# Patient Record
Sex: Male | Born: 1955 | Race: White | Hispanic: No | Marital: Married | Smoking: Former smoker
Health system: Southern US, Community
[De-identification: ages and names within clinical notes are randomized; demographics above are authoritative.]

## PROBLEM LIST (undated history)

## (undated) DIAGNOSIS — S32020A Wedge compression fracture of second lumbar vertebra, initial encounter for closed fracture: Secondary | ICD-10-CM

## (undated) DIAGNOSIS — E669 Obesity, unspecified: Secondary | ICD-10-CM

## (undated) DIAGNOSIS — G4730 Sleep apnea, unspecified: Secondary | ICD-10-CM

## (undated) DIAGNOSIS — E785 Hyperlipidemia, unspecified: Secondary | ICD-10-CM

## (undated) DIAGNOSIS — T4145XA Adverse effect of unspecified anesthetic, initial encounter: Secondary | ICD-10-CM

## (undated) DIAGNOSIS — M961 Postlaminectomy syndrome, not elsewhere classified: Secondary | ICD-10-CM

## (undated) DIAGNOSIS — M19011 Primary osteoarthritis, right shoulder: Secondary | ICD-10-CM

## (undated) DIAGNOSIS — Z8669 Personal history of other diseases of the nervous system and sense organs: Secondary | ICD-10-CM

## (undated) DIAGNOSIS — K219 Gastro-esophageal reflux disease without esophagitis: Secondary | ICD-10-CM

## (undated) DIAGNOSIS — E119 Type 2 diabetes mellitus without complications: Secondary | ICD-10-CM

## (undated) DIAGNOSIS — I350 Nonrheumatic aortic (valve) stenosis: Secondary | ICD-10-CM

## (undated) DIAGNOSIS — I5030 Unspecified diastolic (congestive) heart failure: Secondary | ICD-10-CM

## (undated) DIAGNOSIS — G8929 Other chronic pain: Secondary | ICD-10-CM

## (undated) DIAGNOSIS — I10 Essential (primary) hypertension: Secondary | ICD-10-CM

## (undated) DIAGNOSIS — I509 Heart failure, unspecified: Secondary | ICD-10-CM

## (undated) DIAGNOSIS — M4302 Spondylolysis, cervical region: Secondary | ICD-10-CM

## (undated) DIAGNOSIS — T8859XA Other complications of anesthesia, initial encounter: Secondary | ICD-10-CM

## (undated) HISTORY — PX: JOINT REPLACEMENT: SHX530

## (undated) HISTORY — DX: Essential (primary) hypertension: I10

## (undated) HISTORY — PX: VENTRICULO-PERITONEAL SHUNT PLACEMENT / LAPAROSCOPIC INSERTION PERITONEAL CATHETER: SUR1040

## (undated) HISTORY — PX: BACK SURGERY: SHX140

## (undated) HISTORY — PX: EYE SURGERY: SHX253

## (undated) HISTORY — PX: OTHER SURGICAL HISTORY: SHX169

## (undated) HISTORY — PX: TONSILLECTOMY: SUR1361

## (undated) HISTORY — DX: Type 2 diabetes mellitus without complications: E11.9

## (undated) HISTORY — PX: CATARACT EXTRACTION W/ INTRAOCULAR LENS IMPLANT: SHX1309

## (undated) HISTORY — DX: Hyperlipidemia, unspecified: E78.5

## (undated) HISTORY — PX: CARPAL TUNNEL RELEASE: SHX101

---
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
## 2006-11-07 DIAGNOSIS — J189 Pneumonia, unspecified organism: Secondary | ICD-10-CM | POA: Insufficient documentation

## 2007-04-05 DIAGNOSIS — K219 Gastro-esophageal reflux disease without esophagitis: Secondary | ICD-10-CM | POA: Insufficient documentation

## 2008-07-08 DIAGNOSIS — L209 Atopic dermatitis, unspecified: Secondary | ICD-10-CM | POA: Insufficient documentation

## 2008-07-08 HISTORY — DX: Atopic dermatitis, unspecified: L20.9

## 2008-11-21 DIAGNOSIS — G609 Hereditary and idiopathic neuropathy, unspecified: Secondary | ICD-10-CM | POA: Insufficient documentation

## 2009-02-17 DIAGNOSIS — M1990 Unspecified osteoarthritis, unspecified site: Secondary | ICD-10-CM | POA: Insufficient documentation

## 2010-04-22 DIAGNOSIS — E291 Testicular hypofunction: Secondary | ICD-10-CM

## 2010-04-22 HISTORY — DX: Testicular hypofunction: E29.1

## 2010-08-03 ENCOUNTER — Ambulatory Visit: Payer: Self-pay | Admitting: General Practice

## 2010-08-19 ENCOUNTER — Inpatient Hospital Stay: Payer: Self-pay | Admitting: General Practice

## 2010-09-16 DIAGNOSIS — E119 Type 2 diabetes mellitus without complications: Secondary | ICD-10-CM | POA: Insufficient documentation

## 2010-09-16 HISTORY — DX: Type 2 diabetes mellitus without complications: E11.9

## 2010-09-21 DIAGNOSIS — G4730 Sleep apnea, unspecified: Secondary | ICD-10-CM | POA: Insufficient documentation

## 2010-09-21 DIAGNOSIS — G4733 Obstructive sleep apnea (adult) (pediatric): Secondary | ICD-10-CM | POA: Insufficient documentation

## 2011-05-22 ENCOUNTER — Emergency Department: Payer: Self-pay | Admitting: Emergency Medicine

## 2011-06-01 ENCOUNTER — Emergency Department: Payer: Self-pay | Admitting: Emergency Medicine

## 2011-07-06 ENCOUNTER — Ambulatory Visit: Payer: Self-pay | Admitting: Unknown Physician Specialty

## 2011-07-06 LAB — BASIC METABOLIC PANEL
Anion Gap: 12 (ref 7–16)
Calcium, Total: 9.4 mg/dL (ref 8.5–10.1)
Chloride: 99 mmol/L (ref 98–107)
Co2: 26 mmol/L (ref 21–32)
Creatinine: 0.77 mg/dL (ref 0.60–1.30)
EGFR (Non-African Amer.): >60
Glucose: 269 mg/dL — ABNORMAL HIGH (ref 65–99)
Osmolality: 284 (ref 275–301)
Potassium: 4.1 mmol/L (ref 3.5–5.1)
Sodium: 137 mmol/L (ref 136–145)

## 2012-04-14 ENCOUNTER — Ambulatory Visit: Payer: Self-pay | Admitting: General Practice

## 2012-05-17 HISTORY — PX: REPLACEMENT TOTAL KNEE BILATERAL: SUR1225

## 2012-05-30 ENCOUNTER — Ambulatory Visit: Payer: Self-pay | Admitting: General Practice

## 2012-05-30 LAB — SEDIMENTATION RATE: Erythrocyte Sed Rate: 4 mm/hr (ref 0–20)

## 2012-05-30 LAB — BASIC METABOLIC PANEL
Anion Gap: 8 (ref 7–16)
Calcium, Total: 9 mg/dL (ref 8.5–10.1)
EGFR (African American): >60
EGFR (Non-African Amer.): >60
Glucose: 334 mg/dL — ABNORMAL HIGH (ref 65–99)
Osmolality: 290 (ref 275–301)
Potassium: 3.9 mmol/L (ref 3.5–5.1)

## 2012-05-30 LAB — PROTIME-INR: INR: 0.9

## 2012-05-30 LAB — URINALYSIS, COMPLETE
Bilirubin,UR: NEGATIVE
Glucose,UR: >=500 mg/dL (ref 0–75)
Leukocyte Esterase: NEGATIVE
Nitrite: NEGATIVE
Protein: NEGATIVE
Specific Gravity: 1.033 (ref 1.003–1.030)
Squamous Epithelial: 1
WBC UR: NONE SEEN /HPF (ref 0–5)

## 2012-05-30 LAB — APTT: Activated PTT: 31.4 secs (ref 23.6–35.9)

## 2012-05-30 LAB — CBC
HCT: 47.3 % (ref 40.0–52.0)
MCHC: 33.5 g/dL (ref 32.0–36.0)
MCV: 87 fL (ref 80–100)
Platelet: 195 10*3/uL (ref 150–440)
RBC: 5.43 10*6/uL (ref 4.40–5.90)
RDW: 14.3 % (ref 11.5–14.5)
WBC: 6.9 10*3/uL (ref 3.8–10.6)

## 2012-05-30 LAB — MRSA PCR SCREENING

## 2012-06-23 ENCOUNTER — Inpatient Hospital Stay: Payer: Self-pay | Admitting: General Practice

## 2012-06-23 LAB — SYNOVIAL CELL COUNT + DIFF, W/ CRYSTALS
Basophil: 0 %
Lymphocytes: 53 %
Nucleated Cell Count: 511 /mm3
Other Cells BF: 0 %

## 2012-06-24 LAB — BASIC METABOLIC PANEL
BUN: 18 mg/dL (ref 7–18)
Calcium, Total: 7.4 mg/dL — ABNORMAL LOW (ref 8.5–10.1)
Chloride: 103 mmol/L (ref 98–107)
Co2: 25 mmol/L (ref 21–32)
EGFR (African American): >60
Glucose: 305 mg/dL — ABNORMAL HIGH (ref 65–99)
Osmolality: 285 (ref 275–301)

## 2012-06-24 LAB — HEMOGLOBIN: HGB: 11.4 g/dL — ABNORMAL LOW (ref 13.0–18.0)

## 2012-06-24 LAB — PLATELET COUNT: Platelet: 180 10*3/uL (ref 150–440)

## 2012-06-25 LAB — BASIC METABOLIC PANEL
Anion Gap: 7 (ref 7–16)
BUN: 9 mg/dL (ref 7–18)
Chloride: 100 mmol/L (ref 98–107)
Creatinine: 0.66 mg/dL (ref 0.60–1.30)
EGFR (Non-African Amer.): >60
Osmolality: 276 (ref 275–301)
Potassium: 3.8 mmol/L (ref 3.5–5.1)
Sodium: 135 mmol/L — ABNORMAL LOW (ref 136–145)

## 2012-06-25 LAB — PLATELET COUNT: Platelet: 149 10*3/uL — ABNORMAL LOW (ref 150–440)

## 2012-06-25 LAB — HEMOGLOBIN: HGB: 10.4 g/dL — ABNORMAL LOW (ref 13.0–18.0)

## 2012-06-26 LAB — PATHOLOGY REPORT

## 2012-06-27 LAB — WOUND CULTURE

## 2013-11-06 ENCOUNTER — Ambulatory Visit: Payer: Self-pay | Admitting: Physician Assistant

## 2013-11-29 ENCOUNTER — Ambulatory Visit: Payer: Self-pay | Admitting: Family Medicine

## 2014-09-06 NOTE — Op Note (Signed)
PATIENT NAME:  Nathan Dean, Nathan Dean MR#:  [id number] DATE OF BIRTH:  March 26, 1956  DATE OF PROCEDURE:  06/23/2012  PREOPERATIVE DIAGNOSIS: Loose right total knee arthroplasty.   POSTOPERATIVE DIAGNOSES: 1. 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  3. Metallosis.   PROCEDURE PERFORMED: Right total revision arthroplasty with bone grafting of the tibia and extensive synovectomy.   SURGEON: Illene LabradorJames P. Hooten, M.D.   ASSISTANT: Van ClinesJon Wolfe, PA, (required to maintain retraction throughout the procedure).   ANESTHESIA: Spinal.   ESTIMATED BLOOD LOSS: 600 mL.   FLUIDS REPLACED: 2800 mL of crystalloid.   TOURNIQUET TIME:  1. 91 minutes. 2. 45 minutes.   IMPLANTS UTILIZED: DePuy size 5 Sigma PC-3 femoral component, 5 degree Sigma femoral adapter, +2 Sigma femoral adapter bolt, 40-mm universal femoral sleeve (porous coated), 22-mm x 75-mm fluted stem, size 8-mm distal lateral augment, 4-mm posterior lateral augment, 8-mm posterior medial augment, size 5 MBT revision tibial component, a 45-mm MBT revision metaphyseal sleeve, 75-mm x 22-mm fluted stem, and 20 mL of cancellous bone.   INDICATIONS FOR SURGERY: The patient is a 59 year old male who [facility] approximately 22 years ago. More recently he has had episodes of giving way, pain, and swelling of the right knee. C-reactive protein and sedimentation rate were within normal limits but technetium bone scan demonstrated findings consistent with component loosening. Radiographic findings also demonstrated lucency about the screws through the tibial plate. After discussion of the risks and benefits of surgical intervention, the patient expressed his understanding of the risks, benefits, and agreed with plans for surgical intervention.   PROCEDURE IN DETAIL: The patient was brought to the Operating Room and, after adequate spinal anesthesia was achieved, a tourniquet was placed on the patient's  upper right thigh. The patient's right knee and leg were cleaned and prepped with alcohol and DuraPrep and draped in the usual sterile fashion. A "time-out" was performed as per usual protocol. The right lower extremity was exsanguinated using an Esmarch, and the tourniquet was inflated to 300 mmHg. An anterior incision was made in line with the previous skin incision line. The right knee was then aspirated with approximately 30 mL of slightly cloudy, yellow fluid obtained. The fluid was submitted for a complete cell count, stat Gram stain and cultures. A medial parapatellar incision was made. A moderate effusion was evacuated. There was noted to be significant grayish discoloration of the synovial tissue consistent by extensive metallosis. An extensive synovectomy was performed using electrocautery. The patella was subluxed laterally. Soft tissue was debrided along the margins of the femoral component, as well as along the margins of the tibial component. The locking key for the polyethylene was removed and the polyethylene was removed. Inspection of the polyethylene demonstrated severe wear with evidence of delamination as well as pitting. Next, the femoral component was addressed. Thin osteotomes were used to disrupt the implant cement interface starting first on the anterior flange and then moving along the medial and lateral margins. The femoral component was then removed with little difficulty. Relatively good maintenance of bone stock was appreciated. Next, attention was directed to the tibial component. Thin osteotomes were used at the cement implant interface with continuation of the debridement along both the medial and lateral aspects of the tray. The tibial extractor was then inserted under the plate and the associated slap hammer was used to elevate the tibial tray. This was done after removal of the cancellous screws. There was noted to be significant metallosis  in the soft tissue invasion along the  screw tracks. A curette was used to debride the screw tracts. A pilot hole for reaming of the femoral and tibial trials was performed. Tissue had previously been submitted for pathology, stat Gram stain, and cultures. Swabs for subsequently obtained of the femoral canal and tibial canal. These were submitted for stat Gram stain as well as culture. The femoral and tibial canals were prepared for a 75-mm stem and both were reamed to a 22-mm diameter. The proximal tibia was sized and it was felt that a size 5 tray was appropriate. The proximal tibial cut was performed using an intramedullary cutting guide and specified for 2 degrees posterior slope. Next, serial sleeves were placed up to a 45-mm metaphyseal sleeve. The size 5 tibial tray was positioned. Good coverage was appreciated. A keel punch was inserted.   Next, attention was directed to the distal femur. With the intramedullary guide in place, the distal femoral cutting guide was positioned. It was elected to remove 1 mm of bone from the distal medial aspect and this would require an 8-mm augment for the distal lateral aspect. A size 5 cutting block was then placed and rotation was decided in line with the epicondylar axis. This required an 8-mm augment on the posterior medial aspect and a 4-mm augment on the posterior lateral aspect. Chamfer cuts were subsequently performed. Finally, a cutting guide for the central box was positioned, and a central box cut was performed so as to accommodate a TC3 implant. Next, serial femoral sleeves impacted into place up to a 40-mm sleeve. This allowed for a good purchase. Trial reduction was then performed with the aforementioned augments placed. A 12.5-mm polyethylene trial was inserted. Full extension was noted and a good balance was appreciated. Excellent flexion was also appreciated. The tourniquet was deflated after total tourniquet time of 91 minutes for the first session. Attention was directed to the patella. Soft  tissue around the circumference of the patellar component was debrided. Significant polyethylene wear was encountered. There was some gross loosening of the patellar component and it was easily elevated off of the underlying bone. Rongeurs were used to debride the underlying cement. Oscillating saw was then used to perform a cut on the patella. The patella was sized and it was felt that a 41-mm patellar was appropriate. Patellar trial was placed and the knee was placed through a range of motion with excellent patellar tracking appreciated. The tourniquet had been down for greater than 45 minutes. The right lower extremity was exsanguinated a second time using an Esmarch and the tourniquet was reinflated to 300 mmHg. The trial components were removed. Cortical cancellous bone had been reconstituted and was packed along several of the voids along the metaphyseal region of the tibia. The cut surfaces of bone were irrigated with copious amounts of normal saline with antibiotic solution using pulsatile lavage and then suctioned dry. Polymethyl methacrylate cement with gentamicin was mixed in the usual fashion using a vacuum mixer. The size 5 MBT revision tray with a 45-mm metaphyseal sleeve and a 75-mm x 22-mm fluted stem was then positioned after placement of cement along the cut surface of the tibia as well as on the undersurface of the tray. The tibial construct was impacted into place. Excess cement was removed using Personal assistant. Cement was then applied to the cut surface of the femur, as well as on the posterior flanges of size 5 Sigma TC3 femoral component with a 40-mm femoral sleeve and  a 75-mm x 22-mm fluted stem. The femoral construct was positioned and impacted into place. The excess cement was removed using Personal assistant. A 12.5-mm TC3 polyethylene trial was inserted and the knee was brought in full extension with steady axial compression applied. Finally, cement was applied to the backside of a 41-mm  3-peg oval dome patella and the patellar component was positioned and patellar clamp applied. The excess cement was removed using Personal assistant.   After adequate curing of the cement, the tourniquet was deflated after a second tourniquet time of 45 minutes. Hemostasis was achieved using electrocautery. The polyethylene trial was removed. The knee was irrigated with copious amounts of normal saline with antibiotic solution using pulsatile lavage. Versajet was also used to further debride the medial and lateral gutters as well as along the posterior margin of the joint. The knee was inspected for any residual cement debris. Then 30 mL of 0.25% Marcaine with epinephrine was injected along the posterior capsule. A 12.5-mm TC3 stabilized rotating platform polyethylene insert was inserted and the knee was placed through a range of motion. Excellent patellar tracking was appreciated. Excellent flexion and extension was appreciated. Good stability was noted.   Two medium drains were placed in the wound bed and brought out through a separate stab incision to be attached to a reinfusion system. The medial parapatellar portion of the incision was reapproximated using interrupted sutures of #1 Vicryl. The subcutaneous tissue was approximated in layers using first #0 Vicryl followed by 2-0 Vicryl. The skin was closed with skin staples. A sterile dressing was applied.   The patient tolerated the procedure well. He was transported to the Recovery Room in stable condition.   ____________________________ Illene Labrador. Angie Fava., MD jph:jm D: 06/23/2012 19:13:45 ET T: 06/24/2012 12:57:17 ET JOB#: [id number]  cc: Fayrene Fearing P. Angie Fava., MD, <Dictator> Illene Labrador Angie Fava MD ELECTRONICALLY SIGNED 06/27/2012 6:58

## 2014-09-06 NOTE — Discharge Summary (Signed)
PATIENT NAME:  Nathan Dean, Nathan Dean MR#:  [id number] DATE OF BIRTH:  Jun 22, 1955  DATE OF ADMISSION:  06/23/2012 DATE OF DISCHARGE:    ADMITTING DIAGNOSES:  Pain, right knee secondary to loosening of the total knee arthroplasty.   DISCHARGE DIAGNOSIS:  Pain, right knee secondary to loosening of the total knee arthroplasty.  HISTORY OF PRESENT ILLNESS: The patient is a pleasant 59 year old who has been followed at [facility] for progression of right knee pain. He is status post 22 years right total knee arthroplasty performed at the [facility]. He has continued to have sharp stabbing type pain to the right knee with sensation of a "slipping" as well as "giving way" to the right knee. He had some continued swelling of the knee him. He denied any fever or chills. He also denied any gross locking of the knee. He had not seen any significant improvement in his condition despite activity modification. He was not using any ambulatory aid at the time of surgery. The patient did have extensive work-up for the loosening of component (including CBCs, sedimentation rate, C-reactive protein as well as MRI). There is no indication of any infection.  X-rays taken in the orthopedic department showed some questionable loosening of the tibial tray. A technetium bone scan performed on 04/14/2012 showed localization of tracer noted to the medial aspect and posterior margins of the tibial component. After discussion of the risks and benefits of surgical intervention, the patient expressed his understanding of the risks and benefits and agreed for plans for surgical intervention.   PROCEDURE: Right total knee revision arthroplasty with bone grafting of the tibia and extensive synovectomy.   ANESTHESIA: Spinal.   IMPLANTS UTILIZED: DePuy size 5 Sigma PC3 femoral component, 5 degree Sigma femoral adapter, +2 Sigma femoral adapter bolt, 40 mm Universal femoral sleeve porous-coated, 22 mm x 75 mm fluted stem, size 8 mm  distal lateral augment, 4 mm posterior lateral, 8 mm posterior medial augment, size 5 MBT revision tibial component, a 45 mm MBT revision metaphyseal sleeve, 75 x 22 mm fluted stem and 20 mL of cancellus bone.   HOSPITAL COURSE: The patient tolerated the procedure very well. He had no complications. He was then taken to the PAC-U where he was stabilized and then transferred to the orthopedic floor. He began receiving anticoagulation therapy of Lovenox 30 mg q.12 hours per anesthesia and pharmacy protocol. He was fitted with TED stockings bilaterally. These were allowed to be removed 1 hour per 8 hour shift. The patient was also fitted with the AV-I compression foot pumps bilaterally set at 80 mmHg.  His calves have been nontender. There has been no evidence of any DVTs. Heels were elevated off the bed using rolled towels. The patient was fitted with extra-large bed for large people with air cushioning bladder.  The patient denied chest pain or shortness of breath. Vital signs have been stable. He has been afebrile. Hemodynamically he was stable. No transfusions were given other than the Autovac transfusion given the first 6 hours postoperatively. His blood sugar levels have been running high the entire time.  He was placed on a sliding scale and Novolin R injections.   Physical therapy was initiated on day #1 for activities of daily living and for gait training and transfers. He has been extremely slow due to deconditioning and the extensive amount of surgery.  He was only ambulating 30 feet. He did have a good range of motion of the knee at 0 to 77 degrees.  Occupational  therapy was also initiated on day #1 ADLs and assistive devices.   The patient's IV, Foley and Hemovac were discontinued on day #2 along with the dressing changes. The wound was free of any drainage or signs of infection. The Polar Care was reapplied to the surgical leg maintaining a temperature of 40 to 50 degrees Fahrenheit.    DISPOSITION:  1.  The patient is discharged to skilled nursing facility in improved stable condition.  2.  He is allowed to weight bear as tolerated.  3.  Continue with TED stockings bilaterally. These are allowed to be removed 1 hour per 8 hour shift.  4.  Physical therapy for gait training and transfers.  5.  OT for activities of daily living and assistive devices.  6.  Elevate heels off the bed.  7.  Incentive spirometer q.1 hour while awake.  8.  Encourage cough, deep breathing q.2 hours while awake.  9.  ADA diet.  10.  He has a follow-up appointment in the clinic in 2 weeks.  11.  He is to call the clinic sooner if any temperatures of 101.5 or greater.   DRUG ALLERGIES: 1.  PENICILLIN. 2.  SULFA DRUGS. 3.  SUCCINYLCHOLINE.  MEDICATIONS: Norvasc 10 mg at bedtime.  Vitamin D3 1000 units daily. Senokot-S 1 tablet b.i.d.  hydrochlorothiazide 25 mg q.a.m. Zestril 40 mg at bedtime. Omega-3 fatty acid 1 gram capsule daily. Pantoprazole 40 mg b.i.d. Pravachol 20 mg daily. Lovenox 30 mg subcutaneous q.12 hours for 14 days, then discontinue and begin taking one 81 mg enteric-coated aspirin. Levemir 30 units subcutaneous usual schedule every day 8 a.m.  Insulin sliding scale Novolin R injections. Metformin 1000 mg b.i.d. with meal. Tylenol ES 500 to 1000 mg q.4 hours p.r.n. for pain and temperatures of 100.4. Norco 325/5, 1 to 2 tablets every 4 to 6 hours p.r.n. for pain. Mylanta DS 30 mL every 6 hours p.r.n. Dulcolax suppositories 10 mg rectally p.r.n. for constipation. Milk of magnesia 30 mL b.i.d. p.r.n.  Enema soapsuds if no relief with  results with milk of magnesia or Dulcolax.   PAST MEDICAL HISTORY:  Seasonal allergies, hypertension, diabetes, gastroesophageal reflux disease, hypercholesterolemia.     ____________________________ Van ClinesJon Wolfe, PA jrw:ct D: 06/27/2012 08:28:35 ET T: 06/27/2012 09:26:12 ET JOB#: [id number]  cc: Van ClinesJon Wolfe, PA, <Dictator> Illene LabradorJames P. Angie FavaHooten Jr., MD JON  [facility] PA ELECTRONICALLY SIGNED 07/02/2012 17:36

## 2014-10-21 ENCOUNTER — Other Ambulatory Visit: Payer: Self-pay | Admitting: Family Medicine

## 2014-10-21 MED ORDER — OMEPRAZOLE 20 MG PO CPDR: 20.0000 mg | DELAYED_RELEASE_CAPSULE | Freq: Every day | ORAL | Status: DC

## 2014-12-13 ENCOUNTER — Other Ambulatory Visit: Payer: Self-pay | Admitting: Family Medicine

## 2014-12-23 ENCOUNTER — Encounter: Payer: Self-pay | Admitting: Family Medicine

## 2014-12-23 ENCOUNTER — Ambulatory Visit (INDEPENDENT_AMBULATORY_CARE_PROVIDER_SITE_OTHER): Payer: BC Managed Care – PPO | Admitting: Family Medicine

## 2014-12-23 VITALS — BP 136/72 | HR 95 | Temp 98.8°F | Ht >= 80 in | Wt 275.5 lb

## 2014-12-23 DIAGNOSIS — E1142 Type 2 diabetes mellitus with diabetic polyneuropathy: Secondary | ICD-10-CM

## 2014-12-23 DIAGNOSIS — R42 Dizziness and giddiness: Secondary | ICD-10-CM | POA: Diagnosis not present

## 2014-12-23 DIAGNOSIS — R61 Generalized hyperhidrosis: Secondary | ICD-10-CM | POA: Diagnosis not present

## 2014-12-23 DIAGNOSIS — E1165 Type 2 diabetes mellitus with hyperglycemia: Secondary | ICD-10-CM | POA: Insufficient documentation

## 2014-12-23 LAB — POCT GLYCOSYLATED HEMOGLOBIN (HGB A1C): Hemoglobin A1C: 11

## 2014-12-23 MED ORDER — INSULIN PEN NEEDLE 32G X 4 MM MISC: 1.0000 | Freq: Three times a day (TID) | Status: DC

## 2014-12-23 MED ORDER — INSULIN ASPART 100 UNIT/ML FLEXPEN: 6.0000 [IU] | PEN_INJECTOR | Freq: Three times a day (TID) | SUBCUTANEOUS | Status: DC

## 2014-12-23 NOTE — Assessment & Plan Note (Addendum)
HgA1c elevated to 11% from last check. Sugars elevated above baseline. Suspect diabetes as etiology of symptoms.  Start mealtime insulin.  Lipid panel done today.  Alarm symptoms and hypoglycemia protocol reviewed with patient. RTC 1 week.

## 2014-12-23 NOTE — Progress Notes (Signed)
Subjective:    Patient ID: Nathan Dean, male    DOB: Apr 15, 1956, 59 y.o.   MRN: [id number]  HPI: Nathan Dean is a 59 y.o. male presenting on 12/23/2014 for Dizziness   Dizziness This is a new problem. The current episode started yesterday. The problem occurs intermittently. The problem has been waxing and waning. Associated symptoms include diaphoresis, fatigue and numbness (bilateral hands). Pertinent negatives include no chest pain, congestion, headaches, nausea, visual change, vomiting or weakness. The symptoms are aggravated by standing. He has tried drinking and rest for the symptoms. The treatment provided mild relief.    Pt presents for dizziness. Started yesterday and continued through today. Felt dizzy this AM and turned around to come back home. When he feels dizzy he starts to sweat. This is abnormal for him. He is diabetic- sugar this AM was 240. Avg sugars low 200's. He is taking levemir as directed.  Dizziness is described feeling lightheaded when he gets up. Pt does not describe sensation he could pass out. No chest pain, nausea, shortness of breath, palpitations. Pt denies head injury or headache.   Pt is also reporting increased pain in the hands- he has neuropathic pain from an injury to the hands.   Past Medical History  Diagnosis Date  . Diabetes   . Hypertension   . Hyperlipidemia     Current Outpatient Prescriptions on File Prior to Visit  Medication Sig  . omeprazole (PRILOSEC) 20 MG capsule Take 1 capsule (20 mg total) by mouth daily.   No current facility-administered medications on file prior to visit.    Review of Systems  Constitutional: Positive for diaphoresis and fatigue.  HENT: Negative.  Negative for congestion and tinnitus.   Eyes: Negative for visual disturbance.  Respiratory: Negative for chest tightness, shortness of breath and wheezing.   Cardiovascular: Negative for chest pain, palpitations and leg swelling.  Gastrointestinal: Negative  for nausea and vomiting.  Genitourinary: Negative for hematuria and difficulty urinating.  Neurological: Positive for dizziness, light-headedness and numbness (bilateral hands). Negative for seizures, syncope, facial asymmetry, speech difficulty, weakness and headaches.  Psychiatric/Behavioral: Negative for agitation. The patient is not nervous/anxious.    Per HPI unless specifically indicated above     Objective:    BP 136/72 mmHg  Pulse 95  Temp(Src) 98.8 F (37.1 C) (Oral)  Ht  (2.057 m)  Wt 275 lb 8 oz (124.966 kg)  BMI 29.53 kg/m2  Wt Readings from Last 3 Encounters:  12/23/14 275 lb 8 oz (124.966 kg)    Recent Results (from the past 2160 hour(s))  POCT HgB A1C     Status: Abnormal   Collection Time: 12/23/14  3:52 PM  Result Value Ref Range   Hemoglobin A1C 11.0    Physical Exam  Constitutional: He appears well-developed and well-nourished. No distress.  HENT:  Head: Normocephalic and atraumatic.  Neck: Normal range of motion. Neck supple.  Cardiovascular: Normal rate, regular rhythm and intact distal pulses.  Exam reveals no gallop and no friction rub.   No murmur heard. Pulmonary/Chest: Effort normal and breath sounds normal. He has no wheezes. He exhibits no tenderness.  Abdominal: Soft. Bowel sounds are normal. He exhibits no distension. There is no tenderness.  Musculoskeletal: Normal range of motion. He exhibits no edema.  Lymphadenopathy:    He has no cervical adenopathy.  Neurological: He is alert. He has normal strength and normal reflexes. No cranial nerve deficit or sensory deficit. He displays a negative Romberg sign.  Skin: He is diaphoretic.      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 Sugars elevated above baseline. Suspect diabetes as etiology of symptoms.  Start mealtime insulin.  Lipid  panel done today.  Alarm symptoms and hypoglycemia protocol reviewed with patient. RTC 1 week.       Relevant Medications   LEVEMIR FLEXTOUCH 100 UNIT/ML Pen   losartan (COZAAR) 100 MG tablet   pravastatin (PRAVACHOL) 20 MG tablet   metFORMIN (GLUCOPHAGE) 500 MG tablet   lisinopril (PRINIVIL,ZESTRIL) 40 MG tablet   insulin aspart (NOVOLOG) 100 UNIT/ML FlexPen   Insulin Pen Needle (BD PEN NEEDLE NANO U/F) 32G X 4 MM MISC   Other Relevant Orders   POCT HgB A1C (Completed)   Comprehensive Metabolic Panel (CMET)   Lipid panel   Ambulatory referral to Cardiology    Other Visit Diagnoses    Dizziness        EKG WNL. Would like cardiology consult if ongoing r/o cardiac issues given risk factors.  CBC, CMP, TSH ordered.    Relevant Orders    EKG 12-Lead    CBC with Differential    TSH    Ambulatory referral to Cardiology    Diaphoresis        EKG WNL. suspect diabetes as etiology. Close monitoring of BG. RTC 1 week.     Relevant Orders    Ambulatory referral to Cardiology       Meds ordered this encounter  Medications  . DISCONTD: metoprolol succinate (TOPROL-XL) 50 MG 24 hr tablet    Sig: Take 50 mg by mouth daily.    Refill:  1  . LEVEMIR FLEXTOUCH 100 UNIT/ML Pen    Sig: Inject 48 Units into the skin 2 (two) times daily.    Refill:  1  . hydrochlorothiazide (HYDRODIURIL) 25 MG tablet    Sig: Take 12.5 mg by mouth daily.    Refill:  1  . losartan (COZAAR) 100 MG tablet    Sig: Take 100 mg by mouth daily.    Refill:  0  . amLODipine (NORVASC) 10 MG tablet    Sig: Take 10 mg by mouth daily.    Refill:  0  . pravastatin (PRAVACHOL) 20 MG tablet    Sig: Take 20 mg by mouth daily.    Refill:  0  . metFORMIN (GLUCOPHAGE) 500 MG tablet    Sig: Take 500 mg by mouth 4 (four) times daily.  Marland Kitchen lisinopril (PRINIVIL,ZESTRIL) 40 MG tablet    Sig: Take 40 mg by mouth daily.  Marland Kitchen amitriptyline (ELAVIL) 50 MG tablet    Sig: Take 50 mg by mouth at bedtime.  . Omega-3 Fatty Acids  (FISH OIL) 1200 MG CAPS    Sig: Take 2,400 mg by mouth daily.  . Cholecalciferol (VITAMIN D3) 5000 UNITS CAPS    Sig: Take 10,000 Units by mouth daily.  . insulin aspart (NOVOLOG) 100 UNIT/ML FlexPen    Sig: Inject 6 Units into the skin 3 (three) times daily with meals.    Dispense:  15 mL    Refill:  11    Order Specific Question:  Supervising Provider    Answer:  Janeann Forehand [phone]  . Insulin Pen Needle (BD PEN NEEDLE NANO U/F) 32G X 4 MM MISC  Sig: Inject 1 each as directed 3 (three) times daily.    Dispense:  100 each    Refill:  5    Please dispense enough needles for twice daily for three months. BD U/F Nano Needles 32GX49mm.    Order Specific Question:  Supervising Provider    Answer:  Janeann Forehand [[id number]]      Follow up plan: Return in about 1 week (around 12/30/2014).

## 2014-12-23 NOTE — Patient Instructions (Signed)
Diabetes: I think your symptoms could be related to your diabetes. We are going to increase your insulin Levemir to 50 units twice daily. I want you to start taking mealtime insulin. Check you blood sugars before each meal. If you blood sugar is <100 do not take the insulin. Inject 6 units with each meal.    Please check your blood glucose 3 times daily. If your glucose is < 70 mg/dl or you have symptoms of hypoglycemia confusion, dizziness, headache, jitteriness and sweating please drink 4 oz of juice or soda.  Check blood glucose 15 minutes later. If it has not risen to >100, please seek medical attention. If > 100 please eat a snack containing protein such as peanut butter and crackers.  If you develop: Chest pain, shortness of breath, nausea, vomiting, abdominal pain, fatigue, facial asymmetry or any other concerning symptoms, please seek immediate medical attention.

## 2014-12-24 ENCOUNTER — Telehealth: Payer: Self-pay | Admitting: Family Medicine

## 2014-12-24 LAB — CBC WITH DIFFERENTIAL/PLATELET
Basophils Absolute: 0 10*3/uL (ref 0.0–0.2)
Basos: 1 %
EOS (ABSOLUTE): 0.6 10*3/uL — AB (ref 0.0–0.4)
Eos: 7 %
Hematocrit: 49.2 % (ref 37.5–51.0)
Hemoglobin: 16 g/dL (ref 12.6–17.7)
IMMATURE GRANULOCYTES: 0 %
Immature Grans (Abs): 0 10*3/uL (ref 0.0–0.1)
LYMPHS ABS: 1.9 10*3/uL (ref 0.7–3.1)
Lymphs: 23 %
MCH: 27.7 pg (ref 26.6–33.0)
MCHC: 32.5 g/dL (ref 31.5–35.7)
MCV: 85 fL (ref 79–97)
MONOCYTES: 7 %
Monocytes Absolute: 0.6 10*3/uL (ref 0.1–0.9)
NEUTROS ABS: 5.2 10*3/uL (ref 1.4–7.0)
Neutrophils: 62 %
PLATELETS: 202 10*3/uL (ref 150–379)
RBC: 5.77 x10E6/uL (ref 4.14–5.80)
RDW: 14.5 % (ref 12.3–15.4)
WBC: 8.3 10*3/uL (ref 3.4–10.8)

## 2014-12-24 LAB — COMPREHENSIVE METABOLIC PANEL
ALK PHOS: 92 IU/L (ref 39–117)
ALT: 21 IU/L (ref 0–44)
AST: 28 IU/L (ref 0–40)
Albumin/Globulin Ratio: 1.6 (ref 1.1–2.5)
Albumin: 4.4 g/dL (ref 3.6–4.8)
BILIRUBIN TOTAL: 0.5 mg/dL (ref 0.0–1.2)
BUN / CREAT RATIO: 23 — AB (ref 10–22)
BUN: 17 mg/dL (ref 8–27)
CALCIUM: 9.9 mg/dL (ref 8.6–10.2)
CO2: 24 mmol/L (ref 18–29)
Chloride: 92 mmol/L — ABNORMAL LOW (ref 97–108)
Creatinine, Ser: 0.75 mg/dL — ABNORMAL LOW (ref 0.76–1.27)
GFR calc Af Amer: 115 mL/min/{1.73_m2} (ref >59)
GFR calc non Af Amer: 100 mL/min/{1.73_m2} (ref >59)
GLUCOSE: 332 mg/dL — AB (ref 65–99)
Globulin, Total: 2.7 g/dL (ref 1.5–4.5)
POTASSIUM: 4.4 mmol/L (ref 3.5–5.2)
SODIUM: 136 mmol/L (ref 134–144)
Total Protein: 7.1 g/dL (ref 6.0–8.5)

## 2014-12-24 LAB — TSH: TSH: 1.05 u[IU]/mL (ref 0.450–4.500)

## 2014-12-24 NOTE — Telephone Encounter (Signed)
Reviewed lab results with patient. Glucose was 332. Cl was low- likely due to sweating/fluid loss. Encouraged hydration. CBC and TSH normal. Pt reports symptoms are much improved today with new insulin regimen. The sweating has stopped. Sugars are back to 200 range.  He is taking mealtime insulin as directed. Plan to follow-up in 1 week. Pt will schedule appt.

## 2014-12-26 ENCOUNTER — Telehealth: Payer: Self-pay | Admitting: Family Medicine

## 2014-12-26 NOTE — Telephone Encounter (Signed)
Pt said a nurse called yesterday about making  a cardiology appt.  He agreed to do that.

## 2014-12-26 NOTE — Telephone Encounter (Signed)
Nathan Dean informed pt with appointment date and time.

## 2014-12-27 ENCOUNTER — Other Ambulatory Visit: Payer: Self-pay | Admitting: Family Medicine

## 2014-12-27 DIAGNOSIS — IMO0002 Reserved for concepts with insufficient information to code with codable children: Secondary | ICD-10-CM

## 2014-12-27 DIAGNOSIS — E1165 Type 2 diabetes mellitus with hyperglycemia: Principal | ICD-10-CM

## 2014-12-27 DIAGNOSIS — E1140 Type 2 diabetes mellitus with diabetic neuropathy, unspecified: Secondary | ICD-10-CM

## 2015-01-02 ENCOUNTER — Ambulatory Visit (INDEPENDENT_AMBULATORY_CARE_PROVIDER_SITE_OTHER): Payer: BC Managed Care – PPO | Admitting: Cardiovascular Disease

## 2015-01-02 ENCOUNTER — Encounter: Payer: Self-pay | Admitting: Cardiovascular Disease

## 2015-01-02 VITALS — BP 130/88 | HR 84 | Ht >= 80 in | Wt 384.8 lb

## 2015-01-02 DIAGNOSIS — E785 Hyperlipidemia, unspecified: Secondary | ICD-10-CM | POA: Diagnosis not present

## 2015-01-02 DIAGNOSIS — I10 Essential (primary) hypertension: Secondary | ICD-10-CM | POA: Diagnosis not present

## 2015-01-02 NOTE — Patient Instructions (Addendum)
.  Karenann Cai - nutritionist [phone]  Medication Instructions:  Your physician recommends that you continue on your current medications as directed. Please refer to the Current Medication list given to you today.   Labwork: none  Testing/Procedures: none  Follow-Up: Your physician recommends that you schedule a follow-up appointment with Dr. Elease Hashimoto as needed.   Any Other Special Instructions Will Be Listed Below (If Applicable).

## 2015-01-02 NOTE — Progress Notes (Signed)
Cardiology Office Note   Date:  01/02/2015   ID:  Nathan Dean, DOB 1956-01-01, MRN [id number]  PCP:  Fidel Levy, MD  Cardiologist:   Elease Hashimoto Deloris Ping, MD   Chief Complaint  Patient presents with  . other    Ref by Dr. Juanetta Gosling for cardiac evaluation due to the patient's history of, HTN, Diabetes & hyperlipidemia. Pt. denies chest pain or shortness of breath. Meds reviewed by the patient verbally.    Problem list: 1. Hypertension 2. Diabetes mellitus 3. Hyperlipidemia 4. Morbid obesity   History of Present Illness: Nathan Dean is a 59 y.o. male who presents for further evaluation .  Multiple risk factors for CAD but no symptoms  Works as a Leisure centre manager some at work - no exercise at home. Glucose levels are 240 or so Weight has been going up .  Trying to limit carbs.   No limitations with weekend yard work .   Past Medical History  Diagnosis Date  . Diabetes   . Hypertension   . Hyperlipidemia     Past Surgical History  Procedure Laterality Date  . Replacement total knee bilateral  2014    left 2012 rt 2014     Current Outpatient Prescriptions  Medication Sig Dispense Refill  . amitriptyline (ELAVIL) 100 MG tablet Take 100 mg by mouth at bedtime.    Marland Kitchen amLODipine (NORVASC) 10 MG tablet Take 5 mg by mouth daily.   0  . Cholecalciferol (VITAMIN D3) 5000 UNITS CAPS Take 10,000 Units by mouth daily.     Marland Kitchen glucose blood (BAYER CONTOUR NEXT TEST) test strip Check blood sugar 4 times daily. 100 each 11  . hydrochlorothiazide (HYDRODIURIL) 25 MG tablet Take 12.5 mg by mouth daily.  1  . insulin aspart (NOVOLOG) 100 UNIT/ML FlexPen Inject 6 Units into the skin 3 (three) times daily with meals. 15 mL 11  . Insulin Pen Needle (BD PEN NEEDLE NANO U/F) 32G X 4 MM MISC Inject 1 each as directed 3 (three) times daily. 100 each 5  . LEVEMIR FLEXTOUCH 100 UNIT/ML Pen Inject 50 Units into the skin 2 (two) times daily.   1  . losartan (COZAAR) 100 MG tablet  Take 100 mg by mouth daily.  0  . metFORMIN (GLUCOPHAGE) 500 MG tablet Take 500 mg by mouth 4 (four) times daily.    . metoprolol succinate (TOPROL-XL) 50 MG 24 hr tablet Take 50 mg by mouth daily. Take with or immediately following a meal.    . Omega-3 Fatty Acids (FISH OIL) 1200 MG CAPS Take 2,400 mg by mouth daily.    Marland Kitchen omeprazole (PRILOSEC) 20 MG capsule Take 1 capsule (20 mg total) by mouth daily. 30 capsule 3  . pravastatin (PRAVACHOL) 20 MG tablet Take 20 mg by mouth daily.  0   No current facility-administered medications for this visit.    Allergies:   Penicillins; Succinylcholine chloride; Keflex; and Sulfa antibiotics    Social History:  The patient  reports that he quit smoking about 18 years ago. His smoking use included Cigarettes. He has a 40 pack-year smoking history. He has never used smokeless tobacco. He reports that he drinks alcohol. He reports that he does not use illicit drugs.   Family History:  The patient's family history includes Cancer in his brother and mother; Kidney failure in his father.    ROS:  Please see the history of present illness.    Review of Systems: Constitutional:  denies  fever, chills, diaphoresis, appetite change and fatigue.  HEENT: denies photophobia, eye pain, redness, hearing loss, ear pain, congestion, sore throat, rhinorrhea, sneezing, neck pain, neck stiffness and tinnitus.  Respiratory: denies SOB, DOE, cough, chest tightness, and wheezing.  Cardiovascular: denies chest pain, palpitations and leg swelling.  Gastrointestinal: denies nausea, vomiting, abdominal pain, diarrhea, constipation, blood in stool.  Genitourinary: denies dysuria, urgency, frequency, hematuria, flank pain and difficulty urinating.  Musculoskeletal: denies  myalgias, back pain, joint swelling, arthralgias and gait problem.   Skin: denies pallor, rash and wound.  Neurological: denies dizziness, seizures, syncope, weakness, light-headedness, numbness and headaches.    Hematological: denies adenopathy, easy bruising, personal or family bleeding history.  Psychiatric/ Behavioral: denies suicidal ideation, mood changes, confusion, nervousness, sleep disturbance and agitation.       All other systems are reviewed and negative.    PHYSICAL EXAM: VS:  BP 130/88 mmHg  Pulse 84  Ht 6\' 9"  (2.057 m)  Wt 174.521 kg (384 lb 12 oz)  BMI 41.25 kg/m2 , BMI Body mass index is 41.25 kg/(m^2). GEN: Well nourished, well developed, in no acute distress,   obese, very tall gentleman  HEENT: normal Neck: no JVD, carotid bruits, or masses Cardiac: RRR; no murmurs, rubs, or gallops,no edema  Respiratory:  clear to auscultation bilaterally, normal work of breathing GI: soft, nontender, nondistended, + BS, obesity  MS: no deformity or atrophy Skin: warm and dry, no rash Neuro:  Strength and sensation are intact Psych: normal   EKG:  EKG is not ordered today. The ekg ordered 12/23/14  demonstrates NSR , no ST or T wave changes.    Recent Labs: 12/23/2014: ALT 21; BUN 17; Creatinine, Ser 0.75*; Potassium 4.4; Sodium 136; TSH 1.050    Lipid Panel No results found for: CHOL, TRIG, HDL, CHOLHDL, VLDL, LDLCALC, LDLDIRECT    Wt Readings from Last 3 Encounters:  01/02/15 174.521 kg (384 lb 12 oz)  12/23/14 124.966 kg (275 lb 8 oz)      Other studies Reviewed: Additional studies/ records that were reviewed today include: . Review of the above records demonstrates:    ASSESSMENT AND PLAN:  1.  Risk for cardiac disease: The patient has a history of hypertension, hyperlipidemia, obesity, diabetes mellitus. At present, he is not having any symptoms of chest pain or shortness breath. I do not think that he needs to have any further studies at this time. He certainly needs to work on his risk factors. He admits that he needs to work on his diet. He knows that he needs to lose weight. His diabetes is relatively poorly controlled. He states that his glucose levels are  typically are around 240.  I've encouraged him to exercise on a regular basis. I've encouraged him to watch his dietary intake of fats and carbs.  I've given him the name of a nutritionist, Amy Leron Croak   I'll see him on an as-needed basis.  Current medicines are reviewed at length with the patient today.  The patient does not have concerns regarding medicines.  The following changes have been made:  no change  Labs/ tests ordered today include:  No orders of the defined types were placed in this encounter.     Disposition:   FU with Dr. Juanetta Gosling.  He may see me as needed.     Karim Aiello, Deloris Ping, MD  01/02/2015 12:11 PM    [facility] [address] [place], [place], [place]  [address] Phone: [phone]; Fax: [phone]   [employer] Office  [address] [place], [place]  [address] [phone]   Fax [phone]

## 2015-01-14 ENCOUNTER — Other Ambulatory Visit: Payer: Self-pay | Admitting: Family Medicine

## 2015-01-30 ENCOUNTER — Other Ambulatory Visit: Payer: Self-pay | Admitting: Family Medicine

## 2015-02-05 ENCOUNTER — Telehealth: Payer: Self-pay | Admitting: *Deleted

## 2015-02-05 NOTE — Telephone Encounter (Signed)
We have started PA for Omeprazole. Need to know what medications patient has tried in the past for acid reflux?

## 2015-02-05 NOTE — Telephone Encounter (Signed)
Patient called back and does not know what he has taken in the past. He will call the office back.

## 2015-02-18 NOTE — Telephone Encounter (Signed)
Patient could not think of medications he had taken previously as he has been on Omeprazole for years. [employer] has denied PA request for Omeprazole 20 mg. Coverage beyond 90 days per 180 day period for severe/atypical GERD or moderate GERD with daily/disabling symptoms having failed 30 day trial of high-dose H2 Blockers. Please advise.

## 2015-02-18 NOTE — Telephone Encounter (Signed)
I don't now what he wants from this message.  Does he need different medication.  How bad are his sx. When he is off  any PPi?  -jh

## 2015-02-18 NOTE — Telephone Encounter (Signed)
This was just a FYI until patient contacts office. A letter from patient's insurance will be sent to him in regards to denial.

## 2015-03-03 ENCOUNTER — Other Ambulatory Visit: Payer: Self-pay | Admitting: Family Medicine

## 2015-03-20 LAB — HM DIABETES EYE EXAM

## 2015-04-03 ENCOUNTER — Telehealth: Payer: Self-pay | Admitting: Family Medicine

## 2015-04-03 MED ORDER — PANTOPRAZOLE SODIUM 40 MG PO TBEC: 40.0000 mg | DELAYED_RELEASE_TABLET | Freq: Every day | ORAL | Status: DC

## 2015-04-03 NOTE — Telephone Encounter (Signed)
Pt  Called  Requesting a refill on   Omeprazole   20 mg pt call back # [phone]

## 2015-04-03 NOTE — Telephone Encounter (Signed)
Patient has been on Omeprazole 20 since 2010. Patient's ins will not cover Omeprazole. Patient would like something else called in.

## 2015-04-03 NOTE — Telephone Encounter (Signed)
Tried to contact patient w/o success. Called his spouse and relayed message. Pt was suppose to call office back in September and let us know what he has taken in the past for GERD. Ins plan will not cover Omeprazole w/o PA.

## 2015-04-03 NOTE — Telephone Encounter (Signed)
May send in Protonix (generic), 40 mg., 1 by mouth daily  (#30 / 12 refills).

## 2015-04-03 NOTE — Addendum Note (Signed)
Addended by: Alease FrameARTER, Lilybeth Vien S on: 04/03/2015 01:27 PM   Modules accepted: Orders

## 2015-04-08 ENCOUNTER — Telehealth: Payer: Self-pay | Admitting: Family Medicine

## 2015-04-08 NOTE — Telephone Encounter (Signed)
Generally side effects of the PPIs are all the same.  Same risks as Omeprazole.  He may take whichever he wants.-jh

## 2015-04-08 NOTE — Telephone Encounter (Signed)
Pt is leery of taking the new medication, tantoprazole because of it's side effects and asked if there was any thing else he could try.  If not he will just do OTC.  His call back number is [phone]

## 2015-04-08 NOTE — Telephone Encounter (Signed)
Patient advised.[place] 

## 2015-04-14 ENCOUNTER — Other Ambulatory Visit: Payer: Self-pay | Admitting: Family Medicine

## 2015-04-15 ENCOUNTER — Encounter: Payer: Self-pay | Admitting: Family Medicine

## 2015-04-15 ENCOUNTER — Ambulatory Visit (INDEPENDENT_AMBULATORY_CARE_PROVIDER_SITE_OTHER): Payer: BC Managed Care – PPO | Admitting: Family Medicine

## 2015-04-15 VITALS — BP 147/86 | HR 82 | Temp 98.7°F | Resp 16 | Ht >= 80 in | Wt 387.6 lb

## 2015-04-15 DIAGNOSIS — J040 Acute laryngitis: Secondary | ICD-10-CM

## 2015-04-15 DIAGNOSIS — J40 Bronchitis, not specified as acute or chronic: Secondary | ICD-10-CM

## 2015-04-15 MED ORDER — ALBUTEROL SULFATE HFA 108 (90 BASE) MCG/ACT IN AERS: 2.0000 | INHALATION_SPRAY | Freq: Four times a day (QID) | RESPIRATORY_TRACT | Status: DC | PRN

## 2015-04-15 MED ORDER — DOXYCYCLINE HYCLATE 100 MG PO TABS: 100.0000 mg | ORAL_TABLET | Freq: Two times a day (BID) | ORAL | Status: DC

## 2015-04-15 MED ORDER — DM-GUAIFENESIN ER 30-600 MG PO TB12: 1.0000 | ORAL_TABLET | Freq: Two times a day (BID) | ORAL | Status: DC

## 2015-04-15 NOTE — Addendum Note (Signed)
Addended by: Clayborne ArtistHOLDEN, JAMIE A on: 04/15/2015 02:11 PM   Modules accepted: Orders

## 2015-04-15 NOTE — Progress Notes (Signed)
Name: Nathan Dean   MRN: [id number]    DOB: 1956-01-28   Date:04/15/2015       Progress Note  Subjective  Chief Complaint  Chief Complaint  Patient presents with  . Laryngitis    1 week   . Cough    3 days    HPI  Here c/o congestion and cough with laryngitis fo past week.  Cough worsening for 3 days.  Chest congestion.  Started with laryngitis; worse with cough.  Cough occ. Productive of yellow sputum. No problem-specific assessment & plan notes found for this encounter.   Past Medical History  Diagnosis Date  . Diabetes (HCC)   . Hypertension   . Hyperlipidemia     Social History  Substance Use Topics  . Smoking status: Former Smoker -- 2.00 packs/day for 20 years    Types: Cigarettes    Quit date: 06/28/1996  . Smokeless tobacco: Never Used  . Alcohol Use: 0.0 oz/week    0 Standard drinks or equivalent per week     Comment: occasional     Current outpatient prescriptions:  .  amitriptyline (ELAVIL) 100 MG tablet, Take 100 mg by mouth at bedtime., Disp: , Rfl:  .  amLODipine (NORVASC) 10 MG tablet, take 1/2 tablet by mouth once daily, Disp: 90 tablet, Rfl: 2 .  Cholecalciferol (VITAMIN D3) 5000 UNITS CAPS, Take 10,000 Units by mouth daily. , Disp: , Rfl:  .  Dextromethorphan-Guaifenesin (ROBITUSSIN SUGAR FREE) 10-100 MG/5ML liquid, Take 5 mLs by mouth every 12 (twelve) hours., Disp: , Rfl:  .  glucose blood (BAYER CONTOUR NEXT TEST) test strip, Check blood sugar 4 times daily., Disp: 100 each, Rfl: 11 .  hydrochlorothiazide (HYDRODIURIL) 25 MG tablet, Take 12.5 mg by mouth daily., Disp: , Rfl: 1 .  Insulin Pen Needle (BD PEN NEEDLE NANO U/F) 32G X 4 MM MISC, Inject 1 each as directed 3 (three) times daily., Disp: 100 each, Rfl: 5 .  LEVEMIR FLEXTOUCH 100 UNIT/ML Pen, Inject 50 Units into the skin 2 (two) times daily. , Disp: , Rfl: 1 .  losartan (COZAAR) 100 MG tablet, take 1 tablet by mouth once daily, Disp: 30 tablet, Rfl: 6 .  metFORMIN (GLUCOPHAGE) 500 MG  tablet, take 2 tablets by mouth twice a day, Disp: 360 tablet, Rfl: 3 .  metoprolol succinate (TOPROL-XL) 50 MG 24 hr tablet, take 1 tablet by mouth once daily, Disp: 30 tablet, Rfl: 1 .  MICROLET LANCETS MISC, use 3-4 TIMES A DAY as directed, Disp: 100 each, Rfl: 12 .  Omega-3 Fatty Acids (FISH OIL) 1200 MG CAPS, Take 2,400 mg by mouth daily., Disp: , Rfl:  .  omeprazole (PRILOSEC) 20 MG capsule, Take 1 capsule (20 mg total) by mouth daily., Disp: 30 capsule, Rfl: 3 .  pravastatin (PRAVACHOL) 20 MG tablet, Take 20 mg by mouth daily., Disp: , Rfl: 0  Allergies  Allergen Reactions  . Penicillins Anaphylaxis, Rash and Other (See Comments)  . Succinylcholine Chloride Anaphylaxis and Rash  . Keflex [Cephalexin] Rash  . Sulfa Antibiotics Rash and Other (See Comments)    Review of Systems  Constitutional: Negative for fever, chills, weight loss and malaise/fatigue.  HENT: Negative for hearing loss.   Eyes: Negative for blurred vision and double vision.  Respiratory: Positive for cough, sputum production and wheezing. Negative for shortness of breath.   Cardiovascular: Negative for chest pain, palpitations and leg swelling.  Gastrointestinal: Negative for heartburn, abdominal pain and blood in stool.  Genitourinary: Negative for  dysuria, urgency and frequency.  Skin: Negative for rash.  Neurological: Negative for weakness and headaches.      Objective  Filed Vitals:   04/15/15 1315  BP: 147/86  Pulse: 82  Temp: 98.7 F (37.1 C)  Resp: 16  Height: 6\' 9"  (2.057 m)  Weight: 387 lb 9.6 oz (175.814 kg)  SpO2: 94%     Physical Exam  Constitutional: He is well-developed, well-nourished, and in no distress. No distress.  HENT:  Head: Normocephalic and atraumatic.  Right Ear: External ear normal.  Left Ear: External ear normal.  Nose: Nose normal.  Mouth/Throat: Oropharynx is clear and moist.  Voice is hoarse  Cardiovascular: Normal rate, regular rhythm and normal heart sounds.   Exam reveals no gallop and no friction rub.   No murmur heard. Pulmonary/Chest: Effort normal and breath sounds normal. No respiratory distress. He has no wheezes. He has no rales.  Coarse breath sounds throughout.  Vitals reviewed.     No results found for this or any previous visit (from the past 2160 hour(s)).   Assessment & Plan  1. Bronchitis  - albuterol (PROVENTIL HFA;VENTOLIN HFA) 108 (90 BASE) MCG/ACT inhaler; Inhale 2 puffs into the lungs every 6 (six) hours as needed for wheezing or shortness of breath.  Dispense: 1 Inhaler; Refill: 0 - dextromethorphan-guaiFENesin (MUCINEX DM) 30-600 MG 12hr tablet; Take 1 tablet by mouth 2 (two) times daily.  Dispense: 20 tablet; Refill: 1 - doxycycline (VIBRA-TABS) 100 MG tablet; Take 1 tablet (100 mg total) by mouth 2 (two) times daily.  Dispense: 20 tablet; Refill: 0  2. Laryngitis

## 2015-04-24 ENCOUNTER — Encounter: Payer: Self-pay | Admitting: Family Medicine

## 2015-04-24 ENCOUNTER — Ambulatory Visit (INDEPENDENT_AMBULATORY_CARE_PROVIDER_SITE_OTHER): Payer: BC Managed Care – PPO | Admitting: Family Medicine

## 2015-04-24 VITALS — BP 157/84 | HR 86 | Temp 98.1°F | Resp 16 | Ht >= 80 in | Wt 385.6 lb

## 2015-04-24 DIAGNOSIS — J0101 Acute recurrent maxillary sinusitis: Secondary | ICD-10-CM

## 2015-04-24 DIAGNOSIS — J209 Acute bronchitis, unspecified: Secondary | ICD-10-CM | POA: Diagnosis not present

## 2015-04-24 DIAGNOSIS — E1142 Type 2 diabetes mellitus with diabetic polyneuropathy: Secondary | ICD-10-CM

## 2015-04-24 MED ORDER — PREDNISONE 20 MG PO TABS: 20.0000 mg | ORAL_TABLET | Freq: Every day | ORAL | Status: DC

## 2015-04-24 MED ORDER — LEVOFLOXACIN 500 MG PO TABS: 500.0000 mg | ORAL_TABLET | Freq: Every day | ORAL | Status: DC

## 2015-04-24 NOTE — Patient Instructions (Signed)
Please seek immediate medical attention if you develop shortness of breath not relieve by inhaler, chest pain/tightness, fever > 103 F or other concerning symptoms.   We will try a short burst of prednisone and a new antibiotic for your symptoms.    You can use supportive care at home to help with your symptoms. I have sent Mucinex DM to your pharmacy to help break up the congestion and soothe your cough. Honey is a natural cough suppressant- so add it to your tea in the morning.  If you have a humidifer, set that up in your bedroom at night.

## 2015-04-24 NOTE — Progress Notes (Signed)
Subjective:    Patient ID: Nathan Dean, male    DOB: 09/05/1955, 59 y.o.   MRN: [id number]  HPI: Nathan MottGregory Seth is a 59 y.o. male presenting on 04/24/2015 for Nasal Congestion   HPI  Pt presents for cough and congestion x 2 weeks. Productive cough of yellow sputum, No fevers at home. Completed course of doxycycline for sinus infection with no improvement.  No chest pain. Sore throat from coughing. Using nasocort in the AM. Mucinex. Chest tightness and coughing worst symptoms.   Past Medical History  Diagnosis Date  . Diabetes (HCC)   . Hypertension   . Hyperlipidemia     Current Outpatient Prescriptions on File Prior to Visit  Medication Sig  . albuterol (PROVENTIL HFA;VENTOLIN HFA) 108 (90 BASE) MCG/ACT inhaler Inhale 2 puffs into the lungs every 6 (six) hours as needed for wheezing or shortness of breath.  Marland Kitchen. amitriptyline (ELAVIL) 100 MG tablet Take 100 mg by mouth at bedtime.  Marland Kitchen. amLODipine (NORVASC) 10 MG tablet take 1/2 tablet by mouth once daily  . Cholecalciferol (VITAMIN D3) 5000 UNITS CAPS Take 10,000 Units by mouth daily.   Marland Kitchen. dextromethorphan-guaiFENesin (MUCINEX DM) 30-600 MG 12hr tablet Take 1 tablet by mouth 2 (two) times daily.  Marland Kitchen. glucose blood (BAYER CONTOUR NEXT TEST) test strip Check blood sugar 4 times daily.  . hydrochlorothiazide (HYDRODIURIL) 25 MG tablet Take 12.5 mg by mouth daily.  . Insulin Pen Needle (BD PEN NEEDLE NANO U/F) 32G X 4 MM MISC Inject 1 each as directed 3 (three) times daily.  Marland Kitchen. LEVEMIR FLEXTOUCH 100 UNIT/ML Pen Inject 50 Units into the skin 2 (two) times daily.   Marland Kitchen. losartan (COZAAR) 100 MG tablet take 1 tablet by mouth once daily  . metFORMIN (GLUCOPHAGE) 500 MG tablet take 2 tablets by mouth twice a day  . metoprolol succinate (TOPROL-XL) 50 MG 24 hr tablet take 1 tablet by mouth once daily  . MICROLET LANCETS MISC use 3-4 TIMES A DAY as directed  . Omega-3 Fatty Acids (FISH OIL) 1200 MG CAPS Take 2,400 mg by mouth daily.  Marland Kitchen. omeprazole  (PRILOSEC) 20 MG capsule Take 1 capsule (20 mg total) by mouth daily.  . pravastatin (PRAVACHOL) 20 MG tablet Take 20 mg by mouth daily.   No current facility-administered medications on file prior to visit.    Review of Systems  Constitutional: Negative for fever and chills.  HENT: Positive for congestion, rhinorrhea, sinus pressure and sneezing. Negative for ear pain, postnasal drip and trouble swallowing.   Respiratory: Positive for cough and chest tightness. Negative for shortness of breath and wheezing.   Cardiovascular: Negative for chest pain, palpitations and leg swelling.  Skin: Negative.   Allergic/Immunologic: Positive for environmental allergies.  Neurological: Negative for headaches.   Per HPI unless specifically indicated above     Objective:    BP 157/84 mmHg  Pulse 86  Temp(Src) 98.1 F (36.7 C) (Oral)  Resp 16  Ht 6\' 9"  (2.057 m)  Wt 385 lb 9.6 oz (174.907 kg)  BMI 41.34 kg/m2  SpO2 95%  Wt Readings from Last 3 Encounters:  04/24/15 385 lb 9.6 oz (174.907 kg)  04/15/15 387 lb 9.6 oz (175.814 kg)  01/02/15 384 lb 12 oz (174.521 kg)    Physical Exam  Constitutional: He appears well-developed and well-nourished. No distress.  HENT:  Head: Normocephalic.  Right Ear: Tympanic membrane is not erythematous and not bulging.  Left Ear: Tympanic membrane is not erythematous and not bulging.  Nose:  Mucosal edema, rhinorrhea and nasal septal hematoma present. No sinus tenderness. Right sinus exhibits maxillary sinus tenderness and frontal sinus tenderness. Left sinus exhibits no maxillary sinus tenderness and no frontal sinus tenderness.  Mouth/Throat: Uvula is midline and mucous membranes are normal. Uvula swelling present. Posterior oropharyngeal edema present. No posterior oropharyngeal erythema.  Neck: Neck supple. No Brudzinski's sign and no Kernig's sign noted.  Cardiovascular: Normal rate, regular rhythm and normal heart sounds.   Pulmonary/Chest: Breath sounds  normal. No accessory muscle usage. No tachypnea. No respiratory distress.  Lymphadenopathy:    He has no cervical adenopathy.   Results for orders placed or performed in visit on 12/23/14  CBC with Differential  Result Value Ref Range   WBC 8.3 3.4 - 10.8 x10E3/uL   RBC 5.77 4.14 - 5.80 x10E6/uL   Hemoglobin 16.0 12.6 - 17.7 g/dL   Hematocrit 96.0 45.4 - 51.0 %   MCV 85 79 - 97 fL   MCH 27.7 26.6 - 33.0 pg   MCHC 32.5 31.5 - 35.7 g/dL   RDW 09.8 11.9 - 14.7 %   Platelets 202 150 - 379 x10E3/uL   Neutrophils 62 %   Lymphs 23 %   Monocytes 7 %   Eos 7 %   Basos 1 %   Neutrophils Absolute 5.2 1.4 - 7.0 x10E3/uL   Lymphocytes Absolute 1.9 0.7 - 3.1 x10E3/uL   Monocytes Absolute 0.6 0.1 - 0.9 x10E3/uL   EOS (ABSOLUTE) 0.6 (H) 0.0 - 0.4 x10E3/uL   Basophils Absolute 0.0 0.0 - 0.2 x10E3/uL   Immature Granulocytes 0 %   Immature Grans (Abs) 0.0 0.0 - 0.1 x10E3/uL  Comprehensive Metabolic Panel (CMET)  Result Value Ref Range   Glucose 332 (H) 65 - 99 mg/dL   BUN 17 8 - 27 mg/dL   Creatinine, Ser 8.29 (L) 0.76 - 1.27 mg/dL   GFR calc non Af Amer 100 >59 mL/min/1.73   GFR calc Af Amer 115 >59 mL/min/1.73   BUN/Creatinine Ratio 23 (H) 10 - 22   Sodium 136 134 - 144 mmol/L   Potassium 4.4 3.5 - 5.2 mmol/L   Chloride 92 (L) 97 - 108 mmol/L   CO2 24 18 - 29 mmol/L   Calcium 9.9 8.6 - 10.2 mg/dL   Total Protein 7.1 6.0 - 8.5 g/dL   Albumin 4.4 3.6 - 4.8 g/dL   Globulin, Total 2.7 1.5 - 4.5 g/dL   Albumin/Globulin Ratio 1.6 1.1 - 2.5   Bilirubin Total 0.5 0.0 - 1.2 mg/dL   Alkaline Phosphatase 92 39 - 117 IU/L   AST 28 0 - 40 IU/L   ALT 21 0 - 44 IU/L  TSH  Result Value Ref Range   TSH 1.050 0.450 - 4.500 uIU/mL  POCT HgB A1C  Result Value Ref Range   Hemoglobin A1C 11.0       Assessment & Plan:   Problem List Items Addressed This Visit      Endocrine   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 Continue mucinex DM.  PRN inhaler as needed.     Acute recurrent maxillary sinusitis        Treat with levofloxacin daily for 10 days. Supportive care at home. Return precautions reviewed. Alarm symptoms reviewed.     Relevant Medications  levofloxacin (LEVAQUIN) 500 MG tablet    predniSONE (DELTASONE) 20 MG tablet       Meds ordered this encounter  Medications  . levofloxacin (LEVAQUIN) 500 MG tablet    Sig: Take 1 tablet (500 mg total) by mouth daily.    Dispense:  10 tablet    Refill:  0    Order Specific Question:  Supervising Provider    Answer:  Janeann Forehand [phone]  . predniSONE (DELTASONE) 20 MG tablet    Sig: Take 1 tablet (20 mg total) by mouth daily with breakfast.    Dispense:  10 tablet    Refill:  0    Order Specific Question:  Supervising Provider    Answer:  Janeann Forehand [[id number]]      Follow up plan: Return if symptoms worsen or fail to improve.

## 2015-05-30 ENCOUNTER — Other Ambulatory Visit: Payer: Self-pay | Admitting: *Deleted

## 2015-05-30 ENCOUNTER — Telehealth: Payer: Self-pay | Admitting: Family Medicine

## 2015-05-30 MED ORDER — METFORMIN HCL 500 MG PO TABS: 1000.0000 mg | ORAL_TABLET | Freq: Two times a day (BID) | ORAL | Status: DC

## 2015-05-30 MED ORDER — METOPROLOL SUCCINATE ER 50 MG PO TB24: 50.0000 mg | ORAL_TABLET | Freq: Every day | ORAL | Status: DC

## 2015-05-30 NOTE — Telephone Encounter (Signed)
Refill ordered.[place]

## 2015-05-30 NOTE — Telephone Encounter (Signed)
Pt needs a ref[facility] on [facility] [place]  His call back number is [phone]

## 2015-06-03 ENCOUNTER — Encounter: Payer: Self-pay | Admitting: Family Medicine

## 2015-06-03 ENCOUNTER — Ambulatory Visit (INDEPENDENT_AMBULATORY_CARE_PROVIDER_SITE_OTHER): Payer: BC Managed Care – PPO | Admitting: Family Medicine

## 2015-06-03 VITALS — BP 122/79 | HR 91 | Resp 16 | Ht >= 80 in | Wt 376.0 lb

## 2015-06-03 DIAGNOSIS — K219 Gastro-esophageal reflux disease without esophagitis: Secondary | ICD-10-CM

## 2015-06-03 DIAGNOSIS — F329 Major depressive disorder, single episode, unspecified: Secondary | ICD-10-CM

## 2015-06-03 DIAGNOSIS — Z23 Encounter for immunization: Secondary | ICD-10-CM

## 2015-06-03 DIAGNOSIS — E1142 Type 2 diabetes mellitus with diabetic polyneuropathy: Secondary | ICD-10-CM

## 2015-06-03 DIAGNOSIS — E785 Hyperlipidemia, unspecified: Secondary | ICD-10-CM | POA: Diagnosis not present

## 2015-06-03 DIAGNOSIS — F32A Depression, unspecified: Secondary | ICD-10-CM | POA: Insufficient documentation

## 2015-06-03 DIAGNOSIS — I10 Essential (primary) hypertension: Secondary | ICD-10-CM

## 2015-06-03 DIAGNOSIS — E119 Type 2 diabetes mellitus without complications: Secondary | ICD-10-CM

## 2015-06-03 LAB — POCT GLYCOSYLATED HEMOGLOBIN (HGB A1C): Hemoglobin A1C: 9.2

## 2015-06-03 MED ORDER — LEVEMIR FLEXTOUCH 100 UNIT/ML ~~LOC~~ SOPN: 54.0000 [IU] | PEN_INJECTOR | Freq: Two times a day (BID) | SUBCUTANEOUS | Status: DC

## 2015-06-03 MED ORDER — CARVEDILOL 6.25 MG PO TABS: 6.2500 mg | ORAL_TABLET | Freq: Two times a day (BID) | ORAL | Status: DC

## 2015-06-03 MED ORDER — ESCITALOPRAM OXALATE 10 MG PO TABS: 10.0000 mg | ORAL_TABLET | Freq: Every day | ORAL | Status: DC

## 2015-06-03 NOTE — Progress Notes (Signed)
Name: Nathan Dean   MRN: [id number]    DOB: Jan 21, 1956   Date:06/03/2015       Progress Note  Subjective  Chief Complaint  Chief Complaint  Patient presents with  . Diabetes    12/23/2014 11.0    HPI Here for f/u of DM and HBP.  His sugars are coming down.  He c/o being rather depressed re: his unemployment.  Looking for a job over wide area.  No problem-specific assessment & plan notes found for this encounter.   Past Medical History  Diagnosis Date  . Diabetes (HCC)   . Hypertension   . Hyperlipidemia     Past Surgical History  Procedure Laterality Date  . Replacement total knee bilateral  2014    left 2012 rt 2014    Family History  Problem Relation Age of Onset  . Cancer Mother   . Kidney failure Father   . Cancer Brother     Social History   Social History  . Marital Status: Single    Spouse Name: N/A  . Number of Children: N/A  . Years of Education: N/A   Occupational History  . Not on file.   Social History Main Topics  . Smoking status: Former Smoker -- 2.00 packs/day for 20 years    Types: Cigarettes    Quit date: 06/28/1996  . Smokeless tobacco: Never Used  . Alcohol Use: 0.0 oz/week    0 Standard drinks or equivalent per week     Comment: occasional  . Drug Use: No  . Sexual Activity: Not on file   Other Topics Concern  . Not on file   Social History Narrative     Current outpatient prescriptions:  .  albuterol (PROVENTIL HFA;VENTOLIN HFA) 108 (90 BASE) MCG/ACT inhaler, Inhale 2 puffs into the lungs every 6 (six) hours as needed for wheezing or shortness of breath., Disp: 1 Inhaler, Rfl: 0 .  amitriptyline (ELAVIL) 100 MG tablet, Take 100 mg by mouth at bedtime. Takes 1/2 daily, Disp: , Rfl:  .  amLODipine (NORVASC) 10 MG tablet, take 1/2 tablet by mouth once daily, Disp: 90 tablet, Rfl: 2 .  Cholecalciferol (VITAMIN D3) 5000 UNITS CAPS, Take 10,000 Units by mouth daily. , Disp: , Rfl:  .  glucose blood (BAYER CONTOUR NEXT TEST) test  strip, Check blood sugar 4 times daily., Disp: 100 each, Rfl: 11 .  hydrochlorothiazide (HYDRODIURIL) 25 MG tablet, Take 12.5 mg by mouth daily. Takes 12.5 mg tablet daily, Disp: , Rfl: 1 .  Insulin Pen Needle (BD PEN NEEDLE NANO U/F) 32G X 4 MM MISC, Inject 1 each as directed 3 (three) times daily., Disp: 100 each, Rfl: 5 .  LEVEMIR FLEXTOUCH 100 UNIT/ML Pen, Inject 54 Units into the skin 2 (two) times daily., Disp: 15 mL, Rfl: 12 .  losartan (COZAAR) 100 MG tablet, take 1 tablet by mouth once daily, Disp: 30 tablet, Rfl: 6 .  metFORMIN (GLUCOPHAGE) 500 MG tablet, Take 2 tablets (1,000 mg total) by mouth 2 (two) times daily., Disp: 360 tablet, Rfl: 3 .  MICROLET LANCETS MISC, use 3-4 TIMES A DAY as directed, Disp: 100 each, Rfl: 12 .  Omega-3 Fatty Acids (FISH OIL) 1200 MG CAPS, Take 2,400 mg by mouth daily., Disp: , Rfl:  .  pantoprazole (PROTONIX) 40 MG tablet, , Disp: , Rfl: 1 .  pravastatin (PRAVACHOL) 20 MG tablet, Take 20 mg by mouth daily., Disp: , Rfl: 0 .  carvedilol (COREG) 6.25 MG tablet, Take 1  tablet (6.25 mg total) by mouth 2 (two) times daily with a meal., Disp: 60 tablet, Rfl: 6 .  escitalopram (LEXAPRO) 10 MG tablet, Take 1 tablet (10 mg total) by mouth daily., Disp: 30 tablet, Rfl: 6  Allergies  Allergen Reactions  . Penicillins Anaphylaxis, Rash and Other (See Comments)  . Succinylcholine Chloride Anaphylaxis and Rash  . Keflex [Cephalexin] Rash  . Sulfa Antibiotics Rash and Other (See Comments)     Review of Systems  Constitutional: Positive for weight loss (desired) and malaise/fatigue. Negative for fever and chills.  HENT: Negative for hearing loss.   Eyes: Negative for blurred vision and double vision.  Respiratory: Negative for cough, shortness of breath and wheezing.   Cardiovascular: Negative for chest pain, palpitations and leg swelling.  Gastrointestinal: Negative for heartburn, abdominal pain and blood in stool.  Genitourinary: Negative for dysuria, urgency  and frequency.  Skin: Negative for rash.  Neurological: Negative for tremors, weakness and headaches.  Psychiatric/Behavioral: Positive for depression. The patient has insomnia.       Objective  Filed Vitals:   06/03/15 1356  BP: 122/79  Pulse: 91  Resp: 16  Height:  (2.057 m)  Weight: 376 lb (170.552 kg)    Physical Exam  Constitutional: He is oriented to person, place, and time and well-developed, well-nourished, and in no distress. No distress.  HENT:  Head: Normocephalic and atraumatic.  Eyes: Conjunctivae and EOM are normal. Pupils are equal, round, and reactive to light. No scleral icterus.  Neck: Normal range of motion. Neck supple. Carotid bruit is not present. No thyromegaly present.  Cardiovascular: Normal rate, regular rhythm and normal heart sounds.  Exam reveals no gallop and no friction rub.   No murmur heard. Pulmonary/Chest: Effort normal and breath sounds normal. No respiratory distress. He exhibits no tenderness.  Musculoskeletal: He exhibits edema (1+ bilateral pedal edema.).  Lymphadenopathy:    He has no cervical adenopathy.  Neurological: He is alert and oriented to person, place, and time.  Psychiatric:  Affect is depressed.  No suicidal ideains.  PHQ-9 score of 10.  Vitals reviewed.      No results found for this or any previous visit (from the past 2160 hour(s)).   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 pantoprazole (PROTONIX) 40 MG tablet    Sig:     Refill:  1  . DISCONTD: HYDROcodone-acetaminophen (NORCO/VICODIN) 5-325 MG tablet  Sig:   . DISCONTD: fluocinonide ointment (LIDEX) 0.05 %    Sig:   . DISCONTD: doxycycline (VIBRA-TABS) 100 MG tablet    Sig: Take 100 mg by mouth 2 (two) times daily.    Refill:  0  . LEVEMIR FLEXTOUCH 100 UNIT/ML Pen    Sig: Inject 54 Units into the skin 2 (two) times daily.    Dispense:  15 mL    Refill:  12  . carvedilol (COREG) 6.25 MG tablet    Sig: Take 1 tablet (6.25 mg total) by mouth 2 (two) times daily with a meal.    Dispense:  60 tablet    Refill:  6  . escitalopram (LEXAPRO) 10 MG tablet    Sig: Take 1 tablet (10 mg total) by mouth daily.    Dispense:  30 tablet    Refill:  6   1. Type 2 diabetes mellitus without complication, without long-term current use of insulin (HCC)  - POCT HgB A1C-9.3 - LEVEMIR FLEXTOUCH 100 UNIT/ML Pen; Inject 54 Units into the skin 2 (two) times daily.  Dispense: 15 mL; Refill: 12  2. Need for influenza vaccination  - Flu Vaccine QUAD 36+ mos PF IM (Fluarix & Fluzone Quad PF)  3. 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 Essential hypertension  - carvedilol (COREG) 6.25 MG tablet; Take 1 tablet (6.25 mg total) by mouth 2 (two) times daily with a meal.  Dispense: 60 tablet; Refill: 6 (start after finishing supply of Metoprolol is over.)  5. Hyperlipidemia   6. Gastroesophageal reflux disease without esophagitis   7. Depression  - escitalopram (LEXAPRO) 10 MG tablet; Take 1 tablet  (10 mg total) by mouth daily.  Dispense: 30 tablet; Refill: 6

## 2015-06-05 ENCOUNTER — Ambulatory Visit: Payer: BC Managed Care – PPO | Admitting: Family Medicine

## 2015-07-25 ENCOUNTER — Telehealth: Payer: Self-pay | Admitting: *Deleted

## 2015-07-25 NOTE — Telephone Encounter (Signed)
PA has been initiated thru cover my meds for contour strips.

## 2015-07-28 ENCOUNTER — Telehealth: Payer: Self-pay | Admitting: Family Medicine

## 2015-07-28 MED ORDER — GLUCOSE BLOOD VI STRP
1.0000 | ORAL_STRIP | Freq: Four times a day (QID) | Status: DC
Start: 2015-07-28 — End: 2016-09-13

## 2015-07-28 MED ORDER — ONETOUCH LANCETS MISC: 1.0000 | Freq: Four times a day (QID) | Status: DC

## 2015-07-28 MED ORDER — ONETOUCH ULTRA SYSTEM W/DEVICE KIT: 1.0000 | PACK | Freq: Once | Status: DC

## 2015-07-28 NOTE — Telephone Encounter (Signed)
Pt said his insurance company wants him to change to the One Touch meter.  His call back number is [phone]

## 2015-07-28 NOTE — Telephone Encounter (Signed)
Done

## 2015-08-04 ENCOUNTER — Telehealth: Payer: Self-pay | Admitting: *Deleted

## 2015-08-04 ENCOUNTER — Other Ambulatory Visit: Payer: Self-pay | Admitting: *Deleted

## 2015-08-04 NOTE — Telephone Encounter (Signed)
Patient called stating wrong lancet sent to pharmacy. He uses his forearm/thigh to check blood sugars. I was not able to locate this rx in Epic. Called [facility] and verbally called in rx with pharmacist Irving BurtonEmily. #[phone] refills.

## 2015-08-18 ENCOUNTER — Other Ambulatory Visit: Payer: Self-pay | Admitting: Family Medicine

## 2015-08-18 MED ORDER — PRAVASTATIN SODIUM 20 MG PO TABS: 20.0000 mg | ORAL_TABLET | Freq: Every day | ORAL | Status: DC

## 2015-08-19 ENCOUNTER — Other Ambulatory Visit: Payer: Self-pay | Admitting: Family Medicine

## 2015-08-19 MED ORDER — PRAVASTATIN SODIUM 20 MG PO TABS: 20.0000 mg | ORAL_TABLET | Freq: Every day | ORAL | Status: DC

## 2015-09-23 ENCOUNTER — Other Ambulatory Visit: Payer: Self-pay | Admitting: Family Medicine

## 2015-09-23 MED ORDER — HYDROCHLOROTHIAZIDE 12.5 MG PO TABS: ORAL_TABLET | ORAL | Status: DC

## 2015-10-01 ENCOUNTER — Other Ambulatory Visit: Payer: Self-pay | Admitting: Family Medicine

## 2015-10-03 MED ORDER — AMITRIPTYLINE HCL 100 MG PO TABS: 50.0000 mg | ORAL_TABLET | Freq: Every day | ORAL | Status: DC

## 2015-10-28 ENCOUNTER — Encounter: Payer: Self-pay | Admitting: Family Medicine

## 2015-10-28 ENCOUNTER — Ambulatory Visit (INDEPENDENT_AMBULATORY_CARE_PROVIDER_SITE_OTHER): Payer: BC Managed Care – PPO | Admitting: Family Medicine

## 2015-10-28 VITALS — BP 150/90 | HR 96 | Temp 99.2°F | Resp 16 | Ht 78.0 in | Wt 386.0 lb

## 2015-10-28 DIAGNOSIS — M5442 Lumbago with sciatica, left side: Secondary | ICD-10-CM

## 2015-10-28 MED ORDER — BACLOFEN 10 MG PO TABS: 10.0000 mg | ORAL_TABLET | Freq: Three times a day (TID) | ORAL | Status: DC

## 2015-10-28 MED ORDER — NAPROXEN 500 MG PO TABS: 500.0000 mg | ORAL_TABLET | Freq: Two times a day (BID) | ORAL | Status: DC

## 2015-10-28 MED ORDER — PREDNISONE 10 MG PO TABS: ORAL_TABLET | ORAL | Status: DC

## 2015-10-28 NOTE — Progress Notes (Signed)
Subjective:    Patient ID: Nathan Dean, male    DOB: [birth year], 60 y.o.   MRN: [id number]  HPI: Nathan Dean is a 60 y.o. male presenting on 10/28/2015 for Back Pain   HPI  Pt presents for back pain. Was bringing in cat food last 3 weeks ago and twisted the back. Saw a chiropractor- thought it was sciatic nerve related. Pain is on L side of the back. Most comfortable- lying down 45 degrees. Most painful when sitting down. Pain located in L buttock with radiation to the leg. Occassional numbness- L ankle. No incontinence. No saddle anesthesia.  Home treatment: Amitriptyline at night. Ibuprofen 1026m daily not helping.   Past Medical History  Diagnosis Date  . [place]   . Hypertension   . Hyperlipidemia     Current Outpatient Prescriptions on File Prior to Visit  Medication Sig  . albuterol (PROVENTIL HFA;VENTOLIN HFA) 108 (90 BASE) MCG/ACT inhaler Inhale 2 puffs into the lungs every 6 (six) hours as needed for wheezing or shortness of breath.  .Marland Kitchenamitriptyline (ELAVIL) 100 MG tablet Take 0.5 tablets (50 mg total) by mouth at bedtime.  .Marland KitchenamLODipine (NORVASC) 10 MG tablet take 1/2 tablet by mouth once daily  . Blood Glucose Monitoring Suppl (ONE TOUCH ULTRA SYSTEM KIT) w/Device KIT 1 kit by Does not apply route once. Dx. E11.9  . carvedilol (COREG) 6.25 MG tablet Take 1 tablet (6.25 mg total) by mouth 2 (two) times daily with a meal.  . Cholecalciferol (VITAMIN D3) 5000 UNITS CAPS Take 10,000 Units by mouth daily.   .Marland Kitchenescitalopram (LEXAPRO) 10 MG tablet Take 1 tablet (10 mg total) by mouth daily.  .Marland Kitchenglucose blood test strip 1 each by Other route 4 (four) times daily. Use with One touch meter to check blood sugar. Dx E11.9  . hydrochlorothiazide (HYDRODIURIL) 12.5 MG tablet Take 1 tablet daily  . Insulin Pen Needle (BD PEN NEEDLE NANO U/F) 32G X 4 MM MISC Inject 1 each as directed 3 (three) times daily.  .Marland KitchenLEVEMIR FLEXTOUCH 100 UNIT/ML Pen Inject 54 Units into the skin 2  (two) times daily.  .Marland Kitchenlosartan (COZAAR) 100 MG tablet take 1 tablet by mouth once daily  . metFORMIN (GLUCOPHAGE) 500 MG tablet Take 2 tablets (1,000 mg total) by mouth 2 (two) times daily.  .Marland KitchenMICROLET LANCETS MISC use 3-4 TIMES A DAY as directed  . Omega-3 Fatty Acids (FISH OIL) 1200 MG CAPS Take 2,400 mg by mouth daily.  . ONE TOUCH LANCETS MISC 1 each by Does not apply route 4 (four) times daily. Use with one touch meter to check blood sugar. Dx: E11.9  . pantoprazole (PROTONIX) 40 MG tablet   . pravastatin (PRAVACHOL) 20 MG tablet Take 1 tablet (20 mg total) by mouth daily.   No current facility-administered medications on file prior to visit.    Review of Systems  Constitutional: Negative for fever and chills.  HENT: Negative.   Respiratory: Negative for chest tightness, shortness of breath and wheezing.   Cardiovascular: Negative for chest pain, palpitations and leg swelling.  Gastrointestinal: Negative for nausea, vomiting and abdominal pain.  Endocrine: Negative.   Genitourinary: Negative for dysuria, urgency, discharge, difficulty urinating, penile pain and testicular pain.  Musculoskeletal: Positive for myalgias and back pain. Negative for joint swelling and arthralgias.  Skin: Negative.   Neurological: Positive for numbness (L ankle. ). Negative for dizziness, weakness and headaches.  Psychiatric/Behavioral: Negative for sleep disturbance and dysphoric mood.   Per  HPI unless specifically indicated above     Objective:    BP 150/90 mmHg  Pulse 96  Temp(Src) 99.2 F (37.3 C) (Oral)  Resp 16  Ht _0  (1.981 m)  Wt 386 lb (175.088 kg)  BMI 44.62 kg/m2  Wt Readings from Last 3 Encounters:  10/28/15 386 lb (175.088 kg)  06/03/15 376 lb (170.552 kg)  04/24/15 385 lb 9.6 oz (174.907 kg)    Physical Exam  Constitutional: He is oriented to person, place, and time. He appears well-developed and well-nourished. No distress.  HENT:  Head: Normocephalic and atraumatic.    Neck: Neck supple. No thyromegaly present.  Cardiovascular: Normal rate, regular rhythm and normal heart sounds.  Exam reveals no gallop and no friction rub.   No murmur heard. Pulmonary/Chest: Effort normal and breath sounds normal. He has no wheezes.  Abdominal: Soft. Bowel sounds are normal. He exhibits no distension. There is no tenderness. There is no rebound.  Musculoskeletal: He exhibits no edema.       Right hip: Normal.       Left hip: Normal.       Lumbar back: He exhibits decreased range of motion (Unable to twist fully due to pain.), tenderness and pain. He exhibits no swelling, no edema, no laceration and no spasm.       Back:  Neurological: He is alert and oriented to person, place, and time. He has normal strength. No cranial nerve deficit or sensory deficit. He displays a negative Romberg sign. Coordination and gait normal.  Reflex Scores:      Patellar reflexes are 1+ on the right side and 1+ on the left side. Skin: Skin is warm and dry. No rash noted. No erythema.  Psychiatric: He has a normal mood and affect. His behavior is normal. Thought content normal.   Results for orders placed or performed in visit on 06/03/15  POCT HgB A1C  Result Value Ref Range   Hemoglobin A1C 9.2%       Assessment & Plan:   Problem List Items Addressed This Visit    None    Visit Diagnoses    Bilateral low back pain with left-sided sciatica    -  Primary    Treat for inflammation-prednisone for 6 days and then naproxen BID as needed for pain. Baclofen for muscle spasms. Heat/ice PRN. Alarm symptoms reviewed. Return precautions reviewed. Consider PT if continues.    Relevant Medications    predniSONE (DELTASONE) 10 MG tablet    baclofen (LIORESAL) 10 MG tablet    naproxen (NAPROSYN) 500 MG tablet       Meds ordered this encounter  Medications  . predniSONE (DELTASONE) 10 MG tablet    Sig: Take 6 pills today, 5 pills day 2, 4 pills day 3, 3 pills day 4, 2 pills day 5, 1 pill day  6. STOP.    Dispense:  21 tablet    Refill:  0    Order Specific Question:  Supervising Provider    Answer:  Arlis Porta [phone]  . baclofen (LIORESAL) 10 MG tablet    Sig: Take 1 tablet (10 mg total) by mouth 3 (three) times daily.    Dispense:  30 each    Refill:  0    Order Specific Question:  Supervising Provider    Answer:  Arlis Porta [phone]  . naproxen (NAPROSYN) 500 MG tablet    Sig: Take 1 tablet (500 mg total) by mouth 2 (two) times  daily with a meal.    Dispense:  30 tablet    Refill:  0    Order Specific Question:  Supervising Provider    Answer:  Arlis Porta [[id number]]      Follow up plan: Return if symptoms worsen or fail to improve.

## 2015-10-28 NOTE — Patient Instructions (Addendum)
Let's try prednisone for your pain. Take as directed. Then after your finish your prednisone- start taking naproxen twice daily to help with pain.   You can take baclofen up to 3 times daily to help with pain.   If you develop progressive numbness, weakness, loss of feeling in your pelvis, or loss of bowel/bladder control please seek immediate medical attention.   Back Exercises The following exercises strengthen the muscles that help to support the back. They also help to keep the lower back flexible. Doing these exercises can help to prevent back pain or lessen existing pain. If you have back pain or discomfort, try doing these exercises 2-3 times each day or as told by your health care provider. When the pain goes away, do them once each day, but increase the number of times that you repeat the steps for each exercise (do more repetitions). If you do not have back pain or discomfort, do these exercises once each day or as told by your health care provider. EXERCISES Single Knee to Chest Repeat these steps 3-5 times for each leg: 1. Lie on your back on a firm bed or the floor with your legs extended. 2. Bring one knee to your chest. Your other leg should stay extended and in contact with the floor. 3. Hold your knee in place by grabbing your knee or thigh. 4. Pull on your knee until you feel a gentle stretch in your lower back. 5. Hold the stretch for 10-30 seconds. 6. Slowly release and straighten your leg. Pelvic Tilt Repeat these steps 5-10 times: 1. Lie on your back on a firm bed or the floor with your legs extended. 2. Bend your knees so they are pointing toward the ceiling and your feet are flat on the floor. 3. Tighten your lower abdominal muscles to press your lower back against the floor. This motion will tilt your pelvis so your tailbone points up toward the ceiling instead of pointing to your feet or the floor. 4. With gentle tension and even breathing, hold this position for 5-10  seconds. Cat-Cow Repeat these steps until your lower back becomes more flexible: 1. Get into a hands-and-knees position on a firm surface. Keep your hands under your shoulders, and keep your knees under your hips. You may place padding under your knees for comfort. 2. Let your head hang down, and point your tailbone toward the floor so your lower back becomes rounded like the back of a cat. 3. Hold this position for 5 seconds. 4. Slowly lift your head and point your tailbone up toward the ceiling so your back forms a sagging arch like the back of a cow. 5. Hold this position for 5 seconds. Press-Ups Repeat these steps 5-10 times: 1. Lie on your abdomen (face-down) on the floor. 2. Place your palms near your head, about shoulder-width apart. 3. While you keep your back as relaxed as possible and keep your hips on the floor, slowly straighten your arms to raise the top half of your body and lift your shoulders. Do not use your back muscles to raise your upper torso. You may adjust the placement of your hands to make yourself more comfortable. 4. Hold this position for 5 seconds while you keep your back relaxed. 5. Slowly return to lying flat on the floor. Bridges Repeat these steps 10 times: 1. Lie on your back on a firm surface. 2. Bend your knees so they are pointing toward the ceiling and your feet are flat on  the floor. 3. Tighten your buttocks muscles and lift your buttocks off of the floor until your waist is at almost the same height as your knees. You should feel the muscles working in your buttocks and the back of your thighs. If you do not feel these muscles, slide your feet 1-2 inches farther away from your buttocks. 4. Hold this position for 3-5 seconds. 5. Slowly lower your hips to the starting position, and allow your buttocks muscles to relax completely. If this exercise is too easy, try doing it with your arms crossed over your chest. Abdominal Crunches Repeat these steps 5-10  times: 1. Lie on your back on a firm bed or the floor with your legs extended. 2. Bend your knees so they are pointing toward the ceiling and your feet are flat on the floor. 3. Cross your arms over your chest. 4. Tip your chin slightly toward your chest without bending your neck. 5. Tighten your abdominal muscles and slowly raise your trunk (torso) high enough to lift your shoulder blades a tiny bit off of the floor. Avoid raising your torso higher than that, because it can put too much stress on your low back and it does not help to strengthen your abdominal muscles. 6. Slowly return to your starting position. Back Lifts Repeat these steps 5-10 times: 1. Lie on your abdomen (face-down) with your arms at your sides, and rest your forehead on the floor. 2. Tighten the muscles in your legs and your buttocks. 3. Slowly lift your chest off of the floor while you keep your hips pressed to the floor. Keep the back of your head in line with the curve in your back. Your eyes should be looking at the floor. 4. Hold this position for 3-5 seconds. 5. Slowly return to your starting position. SEEK MEDICAL CARE IF:  Your back pain or discomfort gets much worse when you do an exercise.  Your back pain or discomfort does not lessen within 2 hours after you exercise. If you have any of these problems, stop doing these exercises right away. Do not do them again unless your health care provider says that you can. SEEK IMMEDIATE MEDICAL CARE IF:  You develop sudden, severe back pain. If this happens, stop doing the exercises right away. Do not do them again unless your health care provider says that you can.   This information is not intended to replace advice given to you by your health care provider. Make sure you discuss any questions you have with your health care provider.   Document Released: 06/10/2004 Document Revised: 01/22/2015 Document Reviewed: 06/27/2014 Elsevier Interactive Patient Education  [employer]

## 2015-11-24 ENCOUNTER — Other Ambulatory Visit: Payer: Self-pay

## 2015-11-24 ENCOUNTER — Telehealth: Payer: Self-pay | Admitting: Family Medicine

## 2015-11-24 DIAGNOSIS — I10 Essential (primary) hypertension: Secondary | ICD-10-CM

## 2015-11-24 MED ORDER — LOSARTAN POTASSIUM 100 MG PO TABS: 100.0000 mg | ORAL_TABLET | Freq: Every day | ORAL | Status: DC

## 2015-11-24 NOTE — Telephone Encounter (Signed)
Rx send

## 2015-11-24 NOTE — Telephone Encounter (Signed)
[facility] [place]  His call back number is [phone]

## 2015-12-18 ENCOUNTER — Other Ambulatory Visit: Payer: Self-pay | Admitting: Family Medicine

## 2016-01-06 ENCOUNTER — Other Ambulatory Visit: Payer: Self-pay | Admitting: Family Medicine

## 2016-01-06 DIAGNOSIS — I10 Essential (primary) hypertension: Secondary | ICD-10-CM

## 2016-01-09 ENCOUNTER — Ambulatory Visit
Admission: RE | Admit: 2016-01-09 | Discharge: 2016-01-09 | Disposition: A | Discharge status: Home / Self Care | Payer: BC Managed Care – PPO | Source: Ambulatory Visit | Attending: Family Medicine | Admitting: Family Medicine

## 2016-01-09 ENCOUNTER — Encounter: Payer: Self-pay | Admitting: Family Medicine

## 2016-01-09 ENCOUNTER — Ambulatory Visit (INDEPENDENT_AMBULATORY_CARE_PROVIDER_SITE_OTHER): Payer: BC Managed Care – PPO | Admitting: Family Medicine

## 2016-01-09 VITALS — BP 134/79 | HR 93 | Temp 98.7°F | Resp 16 | Ht 78.0 in | Wt 383.0 lb

## 2016-01-09 DIAGNOSIS — M25371 Other instability, right ankle: Secondary | ICD-10-CM

## 2016-01-09 DIAGNOSIS — S80212A Abrasion, left knee, initial encounter: Secondary | ICD-10-CM

## 2016-01-09 DIAGNOSIS — M7989 Other specified soft tissue disorders: Secondary | ICD-10-CM | POA: Diagnosis not present

## 2016-01-09 MED ORDER — ACE ANKLE BRACE LARGE MISC: 1.0000 | Freq: Every day | 0 refills | Status: DC

## 2016-01-09 NOTE — Patient Instructions (Signed)
We will XR your ankle today. I will call ortho to determine if you should the orthopedist or the podiatrist.

## 2016-01-09 NOTE — Progress Notes (Signed)
Subjective:    Patient ID: Nathan Dean, male    DOB: 05-04-1956, 60 y.o.   MRN: [id number]  HPI: Nathan Dean is a 60 y.o. male presenting on 01/09/2016 for Fall (right ankle gave out twice)   HPI  Pt present falls and rolling ankle. R ankle rolled on Tuesday and he fell hitting both his knees. [place] again after it rolled a second time that day. Has 2 abrasions on anterior surface of the knees. No knee pain or swelling. Stinging from the abrasion. Ankle doesn't hurt. No progressive numbness or tingling in the R foot. He did call his orthopedist who suggested seeing podiatry. Has never had trauma to the ankle in the past.  Wound care treatment- big bandaids and neosporin.    Past Medical History:  Diagnosis Date  . Diabetes ([place])   . Hyperlipidemia   . Hypertension    Social History   Social History  . Marital status: Single    Spouse name: N/A  . Number of children: N/A  . Years of education: N/A   Occupational History  . Not on file.   Social History Main Topics  . Smoking status: Former Smoker    Packs/day: 2.00    Years: 20.00    Types: Cigarettes    Quit date: 06/28/1996  . Smokeless tobacco: Never Used  . Alcohol use 0.0 oz/week     Comment: occasional  . Drug use: No  . Sexual activity: Not on file   Other Topics Concern  . Not on file   Social History Narrative  . No narrative on file   Family History  Problem Relation Age of Onset  . Cancer Mother   . Kidney failure Father   . Cancer Brother    Current Outpatient Prescriptions on File Prior to Visit  Medication Sig  . albuterol (PROVENTIL HFA;VENTOLIN HFA) 108 (90 BASE) MCG/ACT inhaler Inhale 2 puffs into the lungs every 6 (six) hours as needed for wheezing or shortness of breath.  Marland Kitchen amitriptyline (ELAVIL) 100 MG tablet Take 0.5 tablets (50 mg total) by mouth at bedtime.  Marland Kitchen amLODipine (NORVASC) 10 MG tablet take 1/2 tablet by mouth once daily  . baclofen (LIORESAL) 10 MG tablet Take 1 tablet (10  mg total) by mouth 3 (three) times daily.  . Blood Glucose Monitoring Suppl (ONE TOUCH ULTRA SYSTEM KIT) w/Device KIT 1 kit by Does not apply route once. Dx. E11.9  . carvedilol (COREG) 6.25 MG tablet Take 1 tablet (6.25 mg total) by mouth 2 (two) times daily with a meal.  . Cholecalciferol (VITAMIN D3) 5000 UNITS CAPS Take 10,000 Units by mouth daily.   Marland Kitchen escitalopram (LEXAPRO) 10 MG tablet Take 1 tablet (10 mg total) by mouth daily.  Marland Kitchen glucose blood test strip 1 each by Other route 4 (four) times daily. Use with One touch meter to check blood sugar. Dx E11.9  . hydrochlorothiazide (MICROZIDE) 12.5 MG capsule take 1 capsule by mouth once daily  . Insulin Pen Needle (BD PEN NEEDLE NANO U/F) 32G X 4 MM MISC Inject 1 each as directed 3 (three) times daily.  Marland Kitchen LEVEMIR FLEXTOUCH 100 UNIT/ML Pen Inject 54 Units into the skin 2 (two) times daily.  Marland Kitchen losartan (COZAAR) 100 MG tablet take 1 tablet by mouth once daily  . metFORMIN (GLUCOPHAGE) 500 MG tablet Take 2 tablets (1,000 mg total) by mouth 2 (two) times daily.  Marland Kitchen MICROLET LANCETS MISC use 3-4 TIMES A DAY as directed  . naproxen (NAPROSYN)  500 MG tablet Take 1 tablet (500 mg total) by mouth 2 (two) times daily with a meal.  . Omega-3 Fatty Acids (FISH OIL) 1200 MG CAPS Take 2,400 mg by mouth daily.  . ONE TOUCH LANCETS MISC 1 each by Does not apply route 4 (four) times daily. Use with one touch meter to check blood sugar. Dx: E11.9  . pantoprazole (PROTONIX) 40 MG tablet   . pravastatin (PRAVACHOL) 20 MG tablet Take 1 tablet (20 mg total) by mouth daily.   No current facility-administered medications on file prior to visit.     Review of Systems  Constitutional: Negative for chills and fever.  HENT: Negative.   Eyes: Negative.   Respiratory: Negative.   Cardiovascular: Negative for chest pain and palpitations.  Musculoskeletal:       Ankle instability   Skin: Positive for wound.   Per HPI unless specifically indicated above       Objective:    BP 134/79 (BP Location: Left Arm, Patient Position: Sitting, Cuff Size: Large)   Pulse 93   Temp 98.7 F (37.1 C) (Oral)   Resp 16   Ht 6' 6"  (1.981 m)   Wt (!) 383 lb (173.7 kg)   BMI 44.26 kg/m   Wt Readings from Last 3 Encounters:  01/09/16 (!) 383 lb (173.7 kg)  10/28/15 (!) 386 lb (175.1 kg)  06/03/15 (!) 376 lb (170.6 kg)    Physical Exam  HENT:  Head: Normocephalic and atraumatic.  Cardiovascular: Regular rhythm.  Exam reveals no gallop and no friction rub.   No murmur heard. Pulmonary/Chest: He has no decreased breath sounds. He has no wheezes. He has no rhonchi. He has no rales.  Musculoskeletal:       Right ankle: He exhibits decreased range of motion. He exhibits no swelling, no ecchymosis, no deformity and normal pulse. No tenderness. Achilles tendon normal.       Left ankle: Normal.  Decreased eversion.   Skin:     Abrasion L knee: 2.5cmx 3 cm Abrasion R knee: 2x2 cm   Results for orders placed or performed in visit on 06/03/15  POCT HgB A1C  Result Value Ref Range   Hemoglobin A1C 9.2%       Assessment & Plan:       Problem List Items Addressed This Visit    None    Visit Diagnoses    Right ankle instability    -  Primary   XR to rule abnormality causing ankle instability. Confirmed with [facility]- pt should be seen there. Pt will call to make his appt to be seen by Dr. Vickki Muff. Recommend ankle brace for further instability.    Relevant Medications   Elastic Bandages & Supports (ACE ANKLE BRACE LARGE) MISC   Other Relevant Orders   DG Ankle Complete Right   DG Foot Complete Right   Abrasion of knee, left, initial encounter       Wound care provided. Applied triple oinment and keep covered.       Meds ordered this encounter  Medications  . Elastic Bandages & Supports (ACE ANKLE BRACE LARGE) MISC    Sig: 1 each by Does not apply route daily.    Dispense:  1 each    Refill:  0    Order Specific Question:   Supervising  Provider    Answer:   Arlis Porta [phone]      Follow up plan: Return if symptoms worsen or fail to improve.

## 2016-01-13 ENCOUNTER — Ambulatory Visit
Admission: RE | Admit: 2016-01-13 | Discharge: 2016-01-13 | Disposition: A | Discharge status: Home / Self Care | Payer: BC Managed Care – PPO | Source: Ambulatory Visit | Attending: Family Medicine | Admitting: Family Medicine

## 2016-01-13 ENCOUNTER — Telehealth: Payer: Self-pay | Admitting: Family Medicine

## 2016-01-13 ENCOUNTER — Encounter: Payer: Self-pay | Admitting: Family Medicine

## 2016-01-13 ENCOUNTER — Ambulatory Visit (INDEPENDENT_AMBULATORY_CARE_PROVIDER_SITE_OTHER): Payer: BC Managed Care – PPO | Admitting: Family Medicine

## 2016-01-13 VITALS — BP 165/83 | HR 87 | Temp 98.3°F | Resp 16 | Ht 78.0 in | Wt 382.0 lb

## 2016-01-13 DIAGNOSIS — R0781 Pleurodynia: Secondary | ICD-10-CM | POA: Insufficient documentation

## 2016-01-13 MED ORDER — TRAMADOL HCL 50 MG PO TABS: 50.0000 mg | ORAL_TABLET | Freq: Four times a day (QID) | ORAL | 1 refills | Status: DC | PRN

## 2016-01-13 NOTE — Progress Notes (Signed)
Name: Nathan Dean   MRN: [id number]    DOB: 24-Mar-1956   Date:01/13/2016       Progress Note  Subjective  Chief Complaint  Chief Complaint  Patient presents with  . Flank Pain    rib pain    HPI Here c/o L sided rib cage pain.  Pain with movement.  No pain with breathing.  Fell 1 week ago.  Injured knees.  Ankle turned.  Noted L sided ribcage pain the nest day.  Did not think that he hit anything during the falls.  No problem-specific Assessment & Plan notes found for this encounter.   Past Medical History:  Diagnosis Date  . Diabetes ([place])   . Hyperlipidemia   . Hypertension     Social History  Substance Use Topics  . Smoking status: Former Smoker    Packs/day: 2.00    Years: 20.00    Types: Cigarettes    Quit date: 06/28/1996  . Smokeless tobacco: Never Used  . Alcohol use 0.0 oz/week     Comment: occasional     Current Outpatient Prescriptions:  .  albuterol (PROVENTIL HFA;VENTOLIN HFA) 108 (90 BASE) MCG/ACT inhaler, Inhale 2 puffs into the lungs every 6 (six) hours as needed for wheezing or shortness of breath., Disp: 1 Inhaler, Rfl: 0 .  amitriptyline (ELAVIL) 100 MG tablet, Take 0.5 tablets (50 mg total) by mouth at bedtime., Disp: 30 tablet, Rfl: 11 .  amLODipine (NORVASC) 10 MG tablet, take 1/2 tablet by mouth once daily, Disp: 90 tablet, Rfl: 2 .  baclofen (LIORESAL) 10 MG tablet, Take 1 tablet (10 mg total) by mouth 3 (three) times daily., Disp: 30 each, Rfl: 0 .  Blood Glucose Monitoring Suppl (ONE TOUCH ULTRA SYSTEM KIT) w/Device KIT, 1 kit by Does not apply route once. Dx. E11.9, Disp: 1 each, Rfl: 0 .  carvedilol (COREG) 6.25 MG tablet, Take 1 tablet (6.25 mg total) by mouth 2 (two) times daily with a meal., Disp: 60 tablet, Rfl: 6 .  Cholecalciferol (VITAMIN D3) 5000 UNITS CAPS, Take 10,000 Units by mouth daily. , Disp: , Rfl:  .  Elastic Bandages & Supports (ACE ANKLE BRACE LARGE) MISC, 1 each by Does not apply route daily., Disp: 1 each, Rfl: 0 .   escitalopram (LEXAPRO) 10 MG tablet, Take 1 tablet (10 mg total) by mouth daily., Disp: 30 tablet, Rfl: 6 .  glucose blood test strip, 1 each by Other route 4 (four) times daily. Use with One touch meter to check blood sugar. Dx E11.9, Disp: 100 each, Rfl: 12 .  hydrochlorothiazide (MICROZIDE) 12.5 MG capsule, take 1 capsule by mouth once daily, Disp: 30 capsule, Rfl: 3 .  Insulin Pen Needle (BD PEN NEEDLE NANO U/F) 32G X 4 MM MISC, Inject 1 each as directed 3 (three) times daily., Disp: 100 each, Rfl: 5 .  LEVEMIR FLEXTOUCH 100 UNIT/ML Pen, Inject 54 Units into the skin 2 (two) times daily., Disp: 15 mL, Rfl: 12 .  losartan (COZAAR) 100 MG tablet, take 1 tablet by mouth once daily, Disp: 30 tablet, Rfl: 0 .  metFORMIN (GLUCOPHAGE) 500 MG tablet, Take 2 tablets (1,000 mg total) by mouth 2 (two) times daily., Disp: 360 tablet, Rfl: 3 .  MICROLET LANCETS MISC, use 3-4 TIMES A DAY as directed, Disp: 100 each, Rfl: 12 .  naproxen (NAPROSYN) 500 MG tablet, Take 1 tablet (500 mg total) by mouth 2 (two) times daily with a meal., Disp: 30 tablet, Rfl: 0 .  Omega-3 Fatty  Acids (FISH OIL) 1200 MG CAPS, Take 2,400 mg by mouth daily., Disp: , Rfl:  .  ONE TOUCH LANCETS MISC, 1 each by Does not apply route 4 (four) times daily. Use with one touch meter to check blood sugar. Dx: E11.9, Disp: 200 each, Rfl: 12 .  pantoprazole (PROTONIX) 40 MG tablet, , Disp: , Rfl: 1 .  pravastatin (PRAVACHOL) 20 MG tablet, Take 1 tablet (20 mg total) by mouth daily., Disp: 90 tablet, Rfl: 0  Allergies  Allergen Reactions  . Penicillins Anaphylaxis, Rash and Other (See Comments)  . Succinylcholine Chloride Anaphylaxis and Rash  . Keflex [Cephalexin] Rash  . Sulfa Antibiotics Rash and Other (See Comments)    Review of Systems  Constitutional: Negative for chills, fever, malaise/fatigue and weight loss.  HENT: Negative for hearing loss.   Eyes: Negative for blurred vision and double vision.  Respiratory: Negative for cough,  shortness of breath and wheezing.   Cardiovascular: Positive for chest pain (L sided ribcage pain with movement.). Negative for palpitations and leg swelling.  Gastrointestinal: Negative for blood in stool, diarrhea and heartburn.  Genitourinary: Negative for dysuria, frequency and urgency.  Musculoskeletal: Positive for joint pain (ankles and knees.).  Skin: Negative for rash.  Neurological: Negative for weakness and headaches.      Objective  Vitals:   01/13/16 0853  BP: (!) 165/83  Pulse: 87  Resp: 16  Temp: 98.3 F (36.8 C)  TempSrc: Oral  Weight: (!) 382 lb (173.3 kg)  Height: 6' 6"  (1.981 m)     Physical Exam  Constitutional: He is well-developed, well-nourished, and in no distress. No distress.  HENT:  Head: Normocephalic and atraumatic.  Cardiovascular: Normal rate, regular rhythm and normal heart sounds.   Pulmonary/Chest: Effort normal and breath sounds normal. He exhibits tenderness.    Vitals reviewed.     No results found for this or any previous visit (from the past 2160 hour(s)).   Assessment & Plan  1. Rib pain on left side  - DG Ribs Unilateral Left; Future - traMADol (ULTRAM) 50 MG tablet; Take 1 tablet (50 mg total) by mouth every 6 (six) hours as needed for moderate pain.  Dispense: 50 tablet; Refill: 1

## 2016-01-13 NOTE — Telephone Encounter (Signed)
Need to take it regularly for several days to let it catch up to the pain.-jh

## 2016-01-13 NOTE — Telephone Encounter (Signed)
Pt. Called states that the tramadol that was given to him (5 hrs ago) was not doing anything for him pt. Call back # is  [phone]

## 2016-01-14 NOTE — Telephone Encounter (Signed)
Patient notified.[place] 

## 2016-01-21 ENCOUNTER — Other Ambulatory Visit: Payer: Self-pay | Admitting: Family Medicine

## 2016-01-21 DIAGNOSIS — E119 Type 2 diabetes mellitus without complications: Secondary | ICD-10-CM

## 2016-02-19 ENCOUNTER — Other Ambulatory Visit: Payer: Self-pay | Admitting: Family Medicine

## 2016-02-19 DIAGNOSIS — I10 Essential (primary) hypertension: Secondary | ICD-10-CM

## 2016-03-05 ENCOUNTER — Telehealth: Payer: Self-pay | Admitting: Family Medicine

## 2016-03-05 ENCOUNTER — Other Ambulatory Visit: Payer: Self-pay | Admitting: *Deleted

## 2016-03-05 ENCOUNTER — Other Ambulatory Visit: Payer: Self-pay | Admitting: Family Medicine

## 2016-03-05 DIAGNOSIS — G4733 Obstructive sleep apnea (adult) (pediatric): Secondary | ICD-10-CM

## 2016-03-05 NOTE — Telephone Encounter (Signed)
Patient has old cpap machine and requesting a new mask. Called patient as we do not have records of sleep study. He says [place]. Patient uses [facility] supplies.

## 2016-03-05 NOTE — Telephone Encounter (Signed)
Pt needs a prescription for a Cpap mask sent to [facility].  His call back number is [phone]

## 2016-03-08 NOTE — Telephone Encounter (Signed)
Noted-jh 

## 2016-03-11 NOTE — Telephone Encounter (Signed)
Patient will need: 1. Original diagnostic sleep study  2. Current office visit note (within last 6 months) showing his pap usage and benefits from pap therapy  3. Current prescription/referral   Left detailed message for patient.

## 2016-03-23 ENCOUNTER — Encounter: Payer: Self-pay | Admitting: Family Medicine

## 2016-03-23 ENCOUNTER — Ambulatory Visit (INDEPENDENT_AMBULATORY_CARE_PROVIDER_SITE_OTHER): Payer: BC Managed Care – PPO | Admitting: Family Medicine

## 2016-03-23 VITALS — BP 136/78 | HR 75 | Temp 98.0°F | Resp 16 | Ht 78.0 in | Wt 378.0 lb

## 2016-03-23 DIAGNOSIS — J209 Acute bronchitis, unspecified: Secondary | ICD-10-CM | POA: Diagnosis not present

## 2016-03-23 MED ORDER — AZITHROMYCIN 250 MG PO TABS: ORAL_TABLET | ORAL | 0 refills | Status: AC

## 2016-03-23 MED ORDER — BENZONATATE 100 MG PO CAPS: 100.0000 mg | ORAL_CAPSULE | Freq: Four times a day (QID) | ORAL | 1 refills | Status: DC | PRN

## 2016-03-23 NOTE — Progress Notes (Signed)
Name: Nathan Dean   MRN: [id number]    DOB: [birth year]   Date:03/23/2016       Progress Note  Subjective  Chief Complaint  Chief Complaint  Patient presents with  . Sore Throat  . Cough    HPI Here c/o sore throat, and cough for 4 days.  Getting worse daily.  Cough is in chest now, but not yet productive.   No fever.    No problem-specific Assessment & Plan notes found for this encounter.   Past Medical History:  Diagnosis Date  . Diabetes ([place])   . Hyperlipidemia   . Hypertension     Social History  Substance Use Topics  . Smoking status: Former Smoker    Packs/day: 2.00    Years: 20.00    Types: Cigarettes    Quit date: 06/28/1996  . Smokeless tobacco: Never Used  . Alcohol use 0.0 oz/week     Comment: occasional     Current Outpatient Prescriptions:  .  albuterol (PROVENTIL HFA;VENTOLIN HFA) 108 (90 BASE) MCG/ACT inhaler, Inhale 2 puffs into the lungs every 6 (six) hours as needed for wheezing or shortness of breath., Disp: 1 Inhaler, Rfl: 0 .  amitriptyline (ELAVIL) 100 MG tablet, Take 0.5 tablets (50 mg total) by mouth at bedtime., Disp: 30 tablet, Rfl: 11 .  amLODipine (NORVASC) 10 MG tablet, take 1/2 tablet by mouth once daily, Disp: 90 tablet, Rfl: 2 .  baclofen (LIORESAL) 10 MG tablet, Take 1 tablet (10 mg total) by mouth 3 (three) times daily., Disp: 30 each, Rfl: 0 .  Blood Glucose Monitoring Suppl (ONE TOUCH ULTRA SYSTEM KIT) w/Device KIT, 1 kit by Does not apply route once. Dx. E11.9, Disp: 1 each, Rfl: 0 .  carvedilol (COREG) 6.25 MG tablet, Take 1 tablet (6.25 mg total) by mouth 2 (two) times daily with a meal., Disp: 60 tablet, Rfl: 6 .  Cholecalciferol (VITAMIN D3) 5000 UNITS CAPS, Take 10,000 Units by mouth daily. , Disp: , Rfl:  .  Elastic Bandages & Supports (ACE ANKLE BRACE LARGE) MISC, 1 each by Does not apply route daily., Disp: 1 each, Rfl: 0 .  escitalopram (LEXAPRO) 10 MG tablet, Take 1 tablet (10 mg total) by mouth daily., Disp: 30 tablet,  Rfl: 6 .  glucose blood test strip, 1 each by Other route 4 (four) times daily. Use with One touch meter to check blood sugar. Dx E11.9, Disp: 100 each, Rfl: 12 .  hydrochlorothiazide (MICROZIDE) 12.5 MG capsule, take 1 capsule by mouth once daily, Disp: 30 capsule, Rfl: 3 .  Insulin Pen Needle (BD PEN NEEDLE NANO U/F) 32G X 4 MM MISC, Inject 1 each as directed 3 (three) times daily., Disp: 100 each, Rfl: 5 .  LEVEMIR FLEXTOUCH 100 UNIT/ML Pen, inject 54 units subcutaneously twice a day, Disp: 12 pen, Rfl: 12 .  losartan (COZAAR) 100 MG tablet, take 1 tablet by mouth once daily, Disp: 30 tablet, Rfl: 12 .  metFORMIN (GLUCOPHAGE) 500 MG tablet, Take 2 tablets (1,000 mg total) by mouth 2 (two) times daily., Disp: 360 tablet, Rfl: 3 .  MICROLET LANCETS MISC, use 3-4 TIMES A DAY as directed, Disp: 100 each, Rfl: 12 .  naproxen (NAPROSYN) 500 MG tablet, Take 1 tablet (500 mg total) by mouth 2 (two) times daily with a meal., Disp: 30 tablet, Rfl: 0 .  Omega-3 Fatty Acids (FISH OIL) 1200 MG CAPS, Take 2,400 mg by mouth daily., Disp: , Rfl:  .  ONE TOUCH LANCETS MISC, 1  each by Does not apply route 4 (four) times daily. Use with one touch meter to check blood sugar. Dx: E11.9, Disp: 200 each, Rfl: 12 .  pantoprazole (PROTONIX) 40 MG tablet, , Disp: , Rfl: 1 .  pravastatin (PRAVACHOL) 20 MG tablet, Take 1 tablet (20 mg total) by mouth daily., Disp: 90 tablet, Rfl: 0 .  traMADol (ULTRAM) 50 MG tablet, Take 1 tablet (50 mg total) by mouth every 6 (six) hours as needed for moderate pain., Disp: 50 tablet, Rfl: 1 .  azithromycin (ZITHROMAX Z-PAK) 250 MG tablet, Take 2 tablets (500 mg) on  Day 1,  followed by 1 tablet (250 mg) once daily on Days 2 through 5., Disp: 6 each, Rfl: 0 .  benzonatate (TESSALON PERLES) 100 MG capsule, Take 1 capsule (100 mg total) by mouth every 6 (six) hours as needed for cough., Disp: 30 capsule, Rfl: 1  Allergies  Allergen Reactions  . Penicillins Anaphylaxis, Rash and Other (See  Comments)  . Succinylcholine Chloride Anaphylaxis and Rash  . Keflex [Cephalexin] Rash  . Sulfa Antibiotics Rash and Other (See Comments)    Review of Systems  Constitutional: Negative for chills, fever, malaise/fatigue and weight loss.  HENT: Positive for congestion and sore throat. Negative for hearing loss.   Eyes: Negative for blurred vision.  Respiratory: Positive for cough. Negative for sputum production, shortness of breath and wheezing.   Cardiovascular: Negative for chest pain and palpitations.  Gastrointestinal: Negative for abdominal pain, blood in stool and heartburn.  Genitourinary: Negative for dysuria, frequency and urgency.  Musculoskeletal: Negative for myalgias.  Skin: Negative for rash.  Neurological: Negative for dizziness, tingling, tremors, weakness and headaches.      Objective  Vitals:   03/23/16 1042  BP: 136/78  Pulse: 75  Resp: 16  Temp: 98 F (36.7 C)  TempSrc: Oral  Weight: (!) 171.5 kg (378 lb)  Height: 6' 6"  (1.981 m)     Physical Exam  Constitutional: He is oriented to person, place, and time and well-developed, well-nourished, and in no distress. No distress.  HENT:  Head: Normocephalic and atraumatic.  Right Ear: External ear normal.  Left Ear: External ear normal.  Nose: Rhinorrhea present. Right sinus exhibits no maxillary sinus tenderness and no frontal sinus tenderness. Left sinus exhibits no maxillary sinus tenderness and no frontal sinus tenderness.  Mouth/Throat: Oropharynx is clear and moist.  Cardiovascular: Normal rate, regular rhythm and normal heart sounds.   Pulmonary/Chest: Effort normal and breath sounds normal. No respiratory distress. He has no wheezes. He has no rales.  Some coarseness to breath sounds with some deep cough  Lymphadenopathy:    He has no cervical adenopathy.  Neurological: He is alert and oriented to person, place, and time.  Vitals reviewed.     No results found for this or any previous visit  (from the past 2160 hour(s)).   Assessment & Plan  1. Acute bronchitis, unspecified organism  - azithromycin (ZITHROMAX Z-PAK) 250 MG tablet; Take 2 tablets (500 mg) on  Day 1,  followed by 1 tablet (250 mg) once daily on Days 2 through 5.  Dispense: 6 each; Refill: 0 - benzonatate (TESSALON PERLES) 100 MG capsule; Take 1 capsule (100 mg total) by mouth every 6 (six) hours as needed for cough.  Dispense: 30 capsule; Refill: 1

## 2016-03-30 ENCOUNTER — Other Ambulatory Visit: Payer: Self-pay | Admitting: Family Medicine

## 2016-03-30 DIAGNOSIS — R0781 Pleurodynia: Secondary | ICD-10-CM

## 2016-04-12 ENCOUNTER — Ambulatory Visit
Admission: RE | Admit: 2016-04-12 | Discharge: 2016-04-12 | Disposition: A | Discharge status: Home / Self Care | Payer: BC Managed Care – PPO | Source: Ambulatory Visit | Attending: Family Medicine | Admitting: Family Medicine

## 2016-04-12 ENCOUNTER — Ambulatory Visit (INDEPENDENT_AMBULATORY_CARE_PROVIDER_SITE_OTHER): Payer: BC Managed Care – PPO | Admitting: Family Medicine

## 2016-04-12 ENCOUNTER — Encounter: Payer: Self-pay | Admitting: Family Medicine

## 2016-04-12 VITALS — BP 161/92 | HR 90 | Temp 98.6°F | Resp 16 | Ht 78.0 in | Wt 374.0 lb

## 2016-04-12 DIAGNOSIS — J40 Bronchitis, not specified as acute or chronic: Secondary | ICD-10-CM

## 2016-04-12 MED ORDER — FLUTICASONE PROPIONATE HFA 110 MCG/ACT IN AERO: 1.0000 | INHALATION_SPRAY | Freq: Two times a day (BID) | RESPIRATORY_TRACT | 12 refills | Status: DC

## 2016-04-12 MED ORDER — LEVOFLOXACIN 750 MG PO TABS: 750.0000 mg | ORAL_TABLET | Freq: Every day | ORAL | 0 refills | Status: AC

## 2016-04-12 NOTE — Progress Notes (Signed)
Name: Nathan Dean   MRN: [id number]    DOB: 02/25/1956   Date:04/12/2016       Progress Note  Subjective  Chief Complaint  Chief Complaint  Patient presents with  . Bronchitis    HPI Here c/o continued cough and chest congestion.  Says that he does not feel that he got any better with Z-pak, but states that he did get worse 3 days ago with increased chest congestion and cough.  No fever, no chills.  Still feels weak and washed out.  No problem-specific Assessment & Plan notes found for this encounter.   Past Medical History:  Diagnosis Date  . Diabetes (HCC)   . Hyperlipidemia   . Hypertension     Past Surgical History:  Procedure Laterality Date  . REPLACEMENT TOTAL KNEE BILATERAL  2014   left 2012 rt 2014    Family History  Problem Relation Age of Onset  . Cancer Mother   . Kidney failure Father   . Cancer Brother     Social History   Social History  . Marital status: Single    Spouse name: N/A  . Number of children: N/A  . Years of education: N/A   Occupational History  . Not on file.   Social History Main Topics  . Smoking status: Former Smoker    Packs/day: 2.00    Years: 20.00    Types: Cigarettes    Quit date: 06/28/1996  . Smokeless tobacco: Never Used  . Alcohol use 0.0 oz/week     Comment: occasional  . Drug use: No  . Sexual activity: Not on file   Other Topics Concern  . Not on file   Social History Narrative  . No narrative on file     Current Outpatient Prescriptions:  .  albuterol (PROVENTIL HFA;VENTOLIN HFA) 108 (90 BASE) MCG/ACT inhaler, Inhale 2 puffs into the lungs every 6 (six) hours as needed for wheezing or shortness of breath., Disp: 1 Inhaler, Rfl: 0 .  amitriptyline (ELAVIL) 100 MG tablet, Take 0.5 tablets (50 mg total) by mouth at bedtime., Disp: 30 tablet, Rfl: 11 .  amLODipine (NORVASC) 10 MG tablet, take 1/2 tablet by mouth once daily, Disp: 90 tablet, Rfl: 2 .  baclofen (LIORESAL) 10 MG tablet, Take 1 tablet (10  mg total) by mouth 3 (three) times daily., Disp: 30 each, Rfl: 0 .  benzonatate (TESSALON PERLES) 100 MG capsule, Take 1 capsule (100 mg total) by mouth every 6 (six) hours as needed for cough., Disp: 30 capsule, Rfl: 1 .  Blood Glucose Monitoring Suppl (ONE TOUCH ULTRA SYSTEM KIT) w/Device KIT, 1 kit by Does not apply route once. Dx. E11.9, Disp: 1 each, Rfl: 0 .  carvedilol (COREG) 6.25 MG tablet, Take 1 tablet (6.25 mg total) by mouth 2 (two) times daily with a meal., Disp: 60 tablet, Rfl: 6 .  Cholecalciferol (VITAMIN D3) 5000 UNITS CAPS, Take 10,000 Units by mouth daily. , Disp: , Rfl:  .  Elastic Bandages & Supports (ACE ANKLE BRACE LARGE) MISC, 1 each by Does not apply route daily., Disp: 1 each, Rfl: 0 .  escitalopram (LEXAPRO) 10 MG tablet, Take 1 tablet (10 mg total) by mouth daily., Disp: 30 tablet, Rfl: 6 .  glucose blood test strip, 1 each by Other route 4 (four) times daily. Use with One touch meter to check blood sugar. Dx E11.9, Disp: 100 each, Rfl: 12 .  hydrochlorothiazide (MICROZIDE) 12.5 MG capsule, take 1 capsule by mouth once daily,   Disp: 30 capsule, Rfl: 3 .  Insulin Pen Needle (BD PEN NEEDLE NANO U/F) 32G X 4 MM MISC, Inject 1 each as directed 3 (three) times daily., Disp: 100 each, Rfl: 5 .  LEVEMIR FLEXTOUCH 100 UNIT/ML Pen, inject 54 units subcutaneously twice a day, Disp: 12 pen, Rfl: 12 .  losartan (COZAAR) 100 MG tablet, take 1 tablet by mouth once daily, Disp: 30 tablet, Rfl: 12 .  metFORMIN (GLUCOPHAGE) 500 MG tablet, Take 2 tablets (1,000 mg total) by mouth 2 (two) times daily., Disp: 360 tablet, Rfl: 3 .  MICROLET LANCETS MISC, use 3-4 TIMES A DAY as directed, Disp: 100 each, Rfl: 12 .  naproxen (NAPROSYN) 500 MG tablet, Take 1 tablet (500 mg total) by mouth 2 (two) times daily with a meal., Disp: 30 tablet, Rfl: 0 .  Omega-3 Fatty Acids (FISH OIL) 1200 MG CAPS, Take 2,400 mg by mouth daily., Disp: , Rfl:  .  ONE TOUCH LANCETS MISC, 1 each by Does not apply route 4  (four) times daily. Use with one touch meter to check blood sugar. Dx: E11.9, Disp: 200 each, Rfl: 12 .  pantoprazole (PROTONIX) 40 MG tablet, , Disp: , Rfl: 1 .  pravastatin (PRAVACHOL) 20 MG tablet, Take 1 tablet (20 mg total) by mouth daily., Disp: 90 tablet, Rfl: 0 .  traMADol (ULTRAM) 50 MG tablet, take 1 tablet by mouth every 6 hours if needed for pain, Disp: 50 tablet, Rfl: 1 .  fluticasone (FLOVENT HFA) 110 MCG/ACT inhaler, Inhale 1 puff into the lungs 2 (two) times daily., Disp: 1 Inhaler, Rfl: 12 .  levofloxacin (LEVAQUIN) 750 MG tablet, Take 1 tablet (750 mg total) by mouth daily., Disp: 7 tablet, Rfl: 0  Allergies  Allergen Reactions  . Penicillins Anaphylaxis, Rash and Other (See Comments)  . Succinylcholine Chloride Anaphylaxis and Rash  . Keflex [Cephalexin] Rash  . Sulfa Antibiotics Rash and Other (See Comments)     Review of Systems  Constitutional: Positive for malaise/fatigue. Negative for chills, fever and weight loss.  HENT: Negative for hearing loss and tinnitus.   Eyes: Negative for blurred vision and double vision.  Respiratory: Positive for cough and shortness of breath. Negative for sputum production and wheezing.   Cardiovascular: Negative for chest pain, palpitations and leg swelling.  Gastrointestinal: Negative for abdominal pain, blood in stool and heartburn.  Genitourinary: Negative for dysuria, frequency and urgency.  Musculoskeletal: Positive for back pain. Negative for myalgias.  Skin: Negative for rash.  Neurological: Positive for weakness. Negative for dizziness, tingling, tremors and headaches.      Objective  Vitals:   04/12/16 1321  BP: (!) 161/92  Pulse: 90  Resp: 16  Temp: 98.6 F (37 C)  TempSrc: Oral  Weight: (!) 169.6 kg (374 lb)  Height: 6' 6" (1.981 m)    Physical Exam  Constitutional: He is well-developed, well-nourished, and in no distress. No distress.  HENT:  Head: Normocephalic and atraumatic.  Right Ear: External ear  normal.  Left Ear: External ear normal.  Nose: Nose normal.  Mouth/Throat: Oropharynx is clear and moist.  Eyes: Conjunctivae and EOM are normal. Pupils are equal, round, and reactive to light. No scleral icterus.  Neck: Normal range of motion. Neck supple. No thyromegaly present.  Cardiovascular: Normal rate, regular rhythm and normal heart sounds.  Exam reveals no gallop and no friction rub.   No murmur heard. Pulmonary/Chest: Effort normal and breath sounds normal. No respiratory distress. He has no wheezes. He has no   rales.  Some decreased breath sounds throughout.  Musculoskeletal: He exhibits no edema.  Lymphadenopathy:    He has no cervical adenopathy.  Vitals reviewed.      No results found for this or any previous visit (from the past 2160 hour(s)).   Assessment & Plan  Problem List Items Addressed This Visit      Respiratory   Bronchitis, complicated - Primary   Relevant Medications   levofloxacin (LEVAQUIN) 750 MG tablet   fluticasone (FLOVENT HFA) 110 MCG/ACT inhaler   Other Relevant Orders   DG Chest 2 View      Meds ordered this encounter  Medications  . levofloxacin (LEVAQUIN) 750 MG tablet    Sig: Take 1 tablet (750 mg total) by mouth daily.    Dispense:  7 tablet    Refill:  0  . fluticasone (FLOVENT HFA) 110 MCG/ACT inhaler    Sig: Inhale 1 puff into the lungs 2 (two) times daily.    Dispense:  1 Inhaler    Refill:  12   1. Bronchitis, complicated  - levofloxacin (LEVAQUIN) 750 MG tablet; Take 1 tablet (750 mg total) by mouth daily.  Dispense: 7 tablet; Refill: 0 - fluticasone (FLOVENT HFA) 110 MCG/ACT inhaler; Inhale 1 puff into the lungs 2 (two) times daily.  Dispense: 1 Inhaler; Refill: 12 - DG Chest 2 View; Future   

## 2016-04-21 ENCOUNTER — Other Ambulatory Visit: Payer: Self-pay | Admitting: Family Medicine

## 2016-05-11 ENCOUNTER — Other Ambulatory Visit: Payer: Self-pay | Admitting: Family Medicine

## 2016-05-11 DIAGNOSIS — F32A Depression, unspecified: Secondary | ICD-10-CM

## 2016-05-11 DIAGNOSIS — F329 Major depressive disorder, single episode, unspecified: Secondary | ICD-10-CM

## 2016-06-04 ENCOUNTER — Other Ambulatory Visit: Payer: Self-pay | Admitting: Family Medicine

## 2016-06-04 DIAGNOSIS — E1142 Type 2 diabetes mellitus with diabetic polyneuropathy: Secondary | ICD-10-CM

## 2016-06-04 MED ORDER — INSULIN PEN NEEDLE 32G X 4 MM MISC: 1.0000 | Freq: Three times a day (TID) | 3 refills | Status: DC

## 2016-06-25 ENCOUNTER — Other Ambulatory Visit: Payer: Self-pay | Admitting: Family Medicine

## 2016-06-25 DIAGNOSIS — R0781 Pleurodynia: Secondary | ICD-10-CM

## 2016-08-09 ENCOUNTER — Other Ambulatory Visit: Payer: Self-pay | Admitting: Family Medicine

## 2016-08-09 DIAGNOSIS — I10 Essential (primary) hypertension: Secondary | ICD-10-CM

## 2016-08-10 ENCOUNTER — Other Ambulatory Visit: Payer: Self-pay | Admitting: Family Medicine

## 2016-08-11 ENCOUNTER — Ambulatory Visit (INDEPENDENT_AMBULATORY_CARE_PROVIDER_SITE_OTHER): Payer: BC Managed Care – PPO | Admitting: Family Medicine

## 2016-08-11 ENCOUNTER — Encounter: Payer: Self-pay | Admitting: Family Medicine

## 2016-08-11 ENCOUNTER — Other Ambulatory Visit: Payer: Self-pay | Admitting: Family Medicine

## 2016-08-11 VITALS — BP 150/80 | HR 79 | Temp 98.5°F | Resp 16 | Ht >= 80 in | Wt 379.0 lb

## 2016-08-11 DIAGNOSIS — I10 Essential (primary) hypertension: Secondary | ICD-10-CM | POA: Diagnosis not present

## 2016-08-11 DIAGNOSIS — E1142 Type 2 diabetes mellitus with diabetic polyneuropathy: Secondary | ICD-10-CM

## 2016-08-11 DIAGNOSIS — E782 Mixed hyperlipidemia: Secondary | ICD-10-CM

## 2016-08-11 DIAGNOSIS — Z0000 Encounter for general adult medical examination without abnormal findings: Secondary | ICD-10-CM

## 2016-08-11 LAB — POCT GLYCOSYLATED HEMOGLOBIN (HGB A1C): Hemoglobin A1C: 9.1

## 2016-08-11 MED ORDER — AMLODIPINE BESYLATE 10 MG PO TABS: 5.0000 mg | ORAL_TABLET | Freq: Every day | ORAL | 3 refills | Status: DC

## 2016-08-11 MED ORDER — EXENATIDE ER 2 MG/0.85ML ~~LOC~~ AUIJ: 2.0000 mg | AUTO-INJECTOR | SUBCUTANEOUS | 11 refills | Status: DC

## 2016-08-11 MED ORDER — ASPIRIN 81 MG PO TABS: 81.0000 mg | ORAL_TABLET | Freq: Every day | ORAL | Status: DC

## 2016-08-11 NOTE — Patient Instructions (Signed)
Thank you for coming in to clinic today.  1. For Diabetes - A1c 9.1, this is stable since over 1 year ago - Recommend more frequent follow-up to monitor this - Continue current Metformin 1000mg  twice daily - Continue Levemir 54 units twice daily for now - in future once stable on the new medicine, we can lower this, maybe to start at 50 units twice daily, then slowly down by 2 units every few days as tolerated - Start new Bydureon BCise WEEKLY injectable, use coupon card, if the pharmacy or insurance causes an issue with coverage, then let me know we can switch if needed, other option is Trulicity (Dulaglutide) this is exact same class, just slightly different dose, and it is also weekly injection  2. For Blood Pressure - Still mildly elevated - Check at home with BP cuff regularly, write down readings, if persistently >140/90 then next step is increase Amlodipine from half tab to whole tab daily  You will be due for FASTING BLOOD WORK (no food or drink after midnight before, only water or coffee without cream/sugar on the morning of)  - Please go ahead and schedule a "Lab Only" visit in the morning at the clinic for lab draw in 3 months before next Annual Physical - Make sure Lab Only appointment is at least 1-2 weeks before your next appointment, so that results will be available  For Lab Results, once available within 2-3 days of blood draw, you can can log in to MyChart online to view your results and a brief explanation. Also, we can discuss results at next follow-up visit.  Please schedule a follow-up appointment with Dr. Althea CharonKaramalegos in 3 months for Annual Physical  If you have any other questions or concerns, please feel free to call the clinic or send a message through MyChart. You may also schedule an earlier appointment if necessary.  Saralyn PilarAlexander Laasia Arcos, DO [facility]

## 2016-08-11 NOTE — Assessment & Plan Note (Signed)
Poorly controlled, primarily with lack of regular follow-up, non adherence A1c has been stable over past >1 year 9.2 to 9.1 Complications - peripheral neuropathy Failed Med - Victoza (GI intolerance, wt gain)  Plan:  1. Discussion on diabetes prognosis, management - patient seems unconcerned with still A1c >9 after 1 year despite already on insulin therapy 2. Start new med GoogleBydureon BCise, re-attempt GLP1, weekly injectable, reviewed med side effects, benefits, demo use of new pen, given coupon card for low to 0 copay for pharmacy, given concern for ins restrictions - rx sent #4 pens for 1 month, +11 refills - if unable to get then will switch based on ins to Trulicity if able or complete PA 3. Continue current Metformin 1000mg  BID, Levemir 54u BID - apparently he was tried on mealtime insulin as well, reviewed this option, less likely to adhere to this therapy. Also considered SGLT2, but defer to exhaust GLP1 class first before class switch 4. Advised aggressive lifestyle improvement with DM diet, exercise as tolerated - On ASA 81, statin, ARB 5. DM foot exam today, concerning with lack of sensation, at risk, counseled, he has seen podiatry in past 6. Due for DM eye exam, advised schedule with Dr Clydene PughWoodard 7. Due for Pneumonia vaccine - PPSV23 will get at upcoming annual physical 8. Future labs A1c, lipids, chemistry 9. Follow-up 3 months for Annual physical

## 2016-08-11 NOTE — Progress Notes (Signed)
Subjective:    Patient ID: Nathan Dean, male    DOB: [birth year], 61 y.o.   MRN: [id number]  Nathan Dean is a 61 y.o. male presenting on 08/11/2016 for Hypertension  Patient presents for a same day appointment.  HPI   CHRONIC DM, Type 2, Complicated by DM Neuropathy Reports he has long history of DM2. He has not followed up regularly, seems to follow-up about once yearly, despite advised to return sooner. CBGs: Checks some CBGs, does not have results today Meds: Metformin 1000mg  BID (states he heard "problems" with metformin but unsure what others have told him, he has done well on it, tolerating well), Levemir 54u BID - Prior trial on Victoza was discontinued, he had GI intolerance and also gained weight on this, it was discontinued and switched to Levemir >1-2 years ago Reports good compliance. Tolerating well w/o side-effects Currently on ARB Lifestyle: Diet (does not always follow DM diet) - On Amitriptyline half tab nightly for neuropathy, states his wife and dog take gabapentin, he has never taken it, has very limited sensation in both feet, and causes him to fall occasionally, sensation is worse following knee replacements - Takes ASA 81mg  daily - Due for annual eye exam, followed by Dr Clydene Pugh - Due for pneumonia vaccine, he has not had one, only flu shots Denies hypoglycemia, polyuria, visual changes.  CHRONIC HTN: Reports chronic history HTN >10-20 years. Has strong family history of HTN. Was taken off HCTZ 12.5mg  in 12/2015, unclear exact reasoning. - Here today due to needing BP med refill, admits has not followed up as advised Current Meds - Amlodpine 5mg  daily (Half of 10mg  tab), Losartan 100mg  daily, Carvedilol 6.25 BID Reports good compliance, took meds today. Tolerating well, w/o complaints. Lifestyle: - Goes to gym occasionally, recently less active, admits some limitation to exercise s/p TKR bilaterally (R knee replaced twice) - No salt added to diet, checks  food labels for low sodium No prior history of MI or CAD Denies CP, dyspnea, HA, edema, dizziness / lightheadedness   Social History  Substance Use Topics  . Smoking status: Former Smoker    Packs/day: 2.00    Years: 20.00    Types: Cigarettes    Quit date: 06/28/1996  . Smokeless tobacco: Never Used  . Alcohol use 0.0 oz/week     Comment: occasional    Review of Systems Per HPI unless specifically indicated above     Objective:    BP (!) 150/80 (BP Location: Left Arm, Cuff Size: Normal)   Pulse 79   Temp 98.5 F (36.9 C) (Oral)   Resp 16   Ht 6\' 9"  (2.057 m)   Wt (!) 379 lb (171.9 kg)   BMI 40.61 kg/m   Wt Readings from Last 3 Encounters:  08/11/16 (!) 379 lb (171.9 kg)  04/12/16 (!) 374 lb (169.6 kg)  03/23/16 (!) 378 lb (171.5 kg)    Physical Exam  Constitutional: He is oriented to person, place, and time. He appears well-developed and well-nourished. No distress.  Well-appearing, comfortable, cooperative, obese  HENT:  Head: Normocephalic and atraumatic.  Mouth/Throat: Oropharynx is clear and moist.  Eyes: Conjunctivae are normal.  Neck: Normal range of motion. Neck supple. No thyromegaly present.  No carotid bruits  Cardiovascular: Normal rate, regular rhythm, normal heart sounds and intact distal pulses.   No murmur heard. Pulmonary/Chest: Effort normal and breath sounds normal. No respiratory distress. He has no wheezes. He has no rales.  Musculoskeletal: He exhibits no edema.  Lymphadenopathy:    He has no cervical adenopathy.  Neurological: He is alert and oriented to person, place, and time.  Skin: Skin is warm and dry. No rash noted. He is not diaphoretic. No erythema.  Psychiatric: He has a normal mood and affect. His behavior is normal.  Nursing note and vitals reviewed.   DM FOOT EXAM - dry skin generalized, smaller regrown R great toenail, some plantar callus formation. Nearly complete loss of monofilament sensation bilaterally, has some  preserved lateral foot and dorsal foot sensation to pressure only.  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 Discussion on diabetes prognosis, management - patient seems unconcerned with still A1c >9 after 1 year despite already on insulin therapy 2. Start new med GoogleBydureon BCise, re-attempt GLP1, weekly injectable, reviewed med side effects, benefits, demo use of new pen, given coupon card for low to 0 copay for pharmacy, given concern for ins restrictions - rx sent #4 pens for 1 month, +11 refills - if unable to get then will switch based on ins to Trulicity if able or complete PA 3. Continue current Metformin 1000mg  BID, Levemir 54u BID - apparently he was tried on mealtime insulin as well, reviewed this option, less likely to adhere to this therapy. Also considered SGLT2, but defer to exhaust GLP1 class first before class switch 4. Advised aggressive lifestyle improvement with DM diet, exercise as tolerated - On ASA 81, statin, ARB 5. DM foot exam today, concerning with lack of sensation, at risk, counseled, he has seen podiatry in past 6. Due for DM eye exam, advised schedule with Dr Clydene PughWoodard 7. Due for Pneumonia vaccine - PPSV23 will get at upcoming annual physical 8. Future labs A1c, lipids, chemistry 9. Follow-up 3 months for Annual physical       Relevant Medications   Exenatide ER (BYDUREON BCISE) 2 MG/0.85ML AUIJ   aspirin 81 MG tablet   Other Relevant Orders   POCT HgB A1C (Completed)   HTN (hypertension)    Mildly elevated BP  today, even on manual re-check still above goal No known complication from HTN Poor adherence to follow-up - Prior med: HCTZ, unsure reason why DC  Plan: 1. Given limited readings, lack of follow-up, not checking regularly outside office - advised no change today but may make one in near future 2. Continue Amlodipine half tab 10mg  for dose 5mg  daily, Losartan 100mg , Carvedilol 6.25mg  BID (not on for any CAD/MI reason only BP control) 3. Advised get BP cuff, check regularly, record readings, if persistently elevated >140/90, then he can increase amlodipine to whole tab 10, notify us if need new rx 4. Encourage start working on lifestyle, exercise as tolerated, if needed can emphasis diet for weight loss and low sodium 5. Follow-up 3 months for Annual Physical - labs      Relevant Medications   amLODipine (NORVASC) 10 MG tablet   aspirin 81 MG tablet      Meds ordered this encounter  Medications  .  amLODipine (NORVASC) 10 MG tablet    Sig: Take 0.5 tablets (5 mg total) by mouth daily.    Dispense:  45 tablet    Refill:  3  . Exenatide ER (BYDUREON BCISE) 2 MG/0.85ML AUIJ    Sig: Inject 2 mg into the skin once a week.    Dispense:  4 pen    Refill:  11  . aspirin 81 MG tablet    Sig: Take 1 tablet (81 mg total) by mouth daily.    Dispense:  30 tablet      Follow up plan: Return in about 3 months (around 11/11/2016) for 3 months for Annual Physical.  Saralyn Pilar, DO [facility] 08/11/2016, 9:12 PM

## 2016-08-11 NOTE — Assessment & Plan Note (Signed)
Mildly elevated BP today, even on manual re-check still above goal No known complication from HTN Poor adherence to follow-up - Prior med: HCTZ, unsure reason why DC  Plan: 1. Given limited readings, lack of follow-up, not checking regularly outside office - advised no change today but may make one in near future 2. Continue Amlodipine half tab 10mg  for dose 5mg  daily, Losartan 100mg , Carvedilol 6.25mg  BID (not on for any CAD/MI reason only BP control) 3. Advised get BP cuff, check regularly, record readings, if persistently elevated >140/90, then he can increase amlodipine to whole tab 10, notify us if need new rx 4. Encourage start working on lifestyle, exercise as tolerated, if needed can emphasis diet for weight loss and low sodium 5. Follow-up 3 months for Annual Physical - labs

## 2016-08-16 ENCOUNTER — Telehealth: Payer: Self-pay | Admitting: Family Medicine

## 2016-08-16 ENCOUNTER — Other Ambulatory Visit: Payer: Self-pay | Admitting: *Deleted

## 2016-08-16 DIAGNOSIS — E1142 Type 2 diabetes mellitus with diabetic polyneuropathy: Secondary | ICD-10-CM

## 2016-08-16 MED ORDER — METFORMIN HCL 500 MG PO TABS: 1000.0000 mg | ORAL_TABLET | Freq: Two times a day (BID) | ORAL | 3 refills | Status: DC

## 2016-08-16 MED ORDER — DULAGLUTIDE 0.75 MG/0.5ML ~~LOC~~ SOAJ: 0.7500 mg | SUBCUTANEOUS | 2 refills | Status: DC

## 2016-08-16 NOTE — Telephone Encounter (Signed)
Last visit with patient 08/11/16, see note for details regarding Diabetes management, with A1c stable at 9.1 (from 9.2) and previously up to 11.  Attempted to call patient, left a voicemail with some of the information below, mainly notifying patient that his new rx was sent to pharmacy, we had to discontinued the previous one.  Bydureon BCise was not covered by his insurance plan, despite co-pay coupon card. Required him to try and/or fail 2 other GLP1 agents, Victoza (Liraglutide) and Trulicity (Dulaglutide) first. Discussed options with Bydureon rep for coverage but seems not going to be possible for this patient.  He has failed Victoza in the past already due to weight gain and inadequate results.  Now, sent new rx Trulicity (Dulaglutide) to his [employer], start with 0.75mg  WEEKLY injection same day each week, given #4 pens for a 1 month supply with refills.  He has been cautioned on side effect with nausea, vomiting. Make sure always take it with food.  Also, advised him to LOWER dose of insulin Levemir - from 54 units twice daily down to 50 unit twice daily, he may also slowly reduce this amount further if blood sugars are improving on the new medicine.  If needed within next 1-3 months we can increase Trulicity dose from 0.75mg  weekly to MAX dose of 1.5mg  weekly, will send new rx for this if needed.  Saralyn Pilar, DO [facility] 08/16/2016, 4:27 PM

## 2016-08-25 LAB — HM DIABETES EYE EXAM

## 2016-09-01 ENCOUNTER — Telehealth: Payer: Self-pay | Admitting: Family Medicine

## 2016-09-01 DIAGNOSIS — I10 Essential (primary) hypertension: Secondary | ICD-10-CM

## 2016-09-01 MED ORDER — HYDROCHLOROTHIAZIDE 12.5 MG PO CAPS
12.5000 mg | ORAL_CAPSULE | Freq: Every day | ORAL | 3 refills | Status: DC
Start: 2016-09-01 — End: 2017-02-04

## 2016-09-01 NOTE — Telephone Encounter (Signed)
Pt asked for a refill on hydrochlorothiazide.  He uses [facility].  His call back number is [phone]

## 2016-09-01 NOTE — Telephone Encounter (Signed)
Last visit 08/11/16, determined he was off HCTZ and decided to only resume Amlodipine and Losartan at that time, now he is requesting refill HCTZ, restart previous dose at 12.5mg  daily for now, refill sent.  Saralyn Pilar, DO [facility] 09/01/2016, 5:18 PM

## 2016-09-03 ENCOUNTER — Ambulatory Visit (INDEPENDENT_AMBULATORY_CARE_PROVIDER_SITE_OTHER): Payer: BC Managed Care – PPO | Admitting: Nurse Practitioner

## 2016-09-03 ENCOUNTER — Encounter: Payer: Self-pay | Admitting: Nurse Practitioner

## 2016-09-03 VITALS — BP 185/91 | HR 81 | Temp 98.8°F | Resp 16 | Ht >= 80 in | Wt 379.0 lb

## 2016-09-03 DIAGNOSIS — M546 Pain in thoracic spine: Secondary | ICD-10-CM | POA: Diagnosis not present

## 2016-09-03 DIAGNOSIS — W108XXA Fall (on) (from) other stairs and steps, initial encounter: Secondary | ICD-10-CM

## 2016-09-03 DIAGNOSIS — G629 Polyneuropathy, unspecified: Secondary | ICD-10-CM

## 2016-09-03 MED ORDER — BACLOFEN 10 MG PO TABS: 10.0000 mg | ORAL_TABLET | Freq: Three times a day (TID) | ORAL | 0 refills | Status: DC

## 2016-09-03 NOTE — Progress Notes (Signed)
Subjective:    Patient ID: Nathan Dean, male    DOB: June 27, 1955, 61 y.o.   MRN: [id number]  Nathan Dean is a 61 y.o. male presenting on 09/03/2016 for Fall (pt fell last sunday down in garage hurt back side of muscle is tender more on Right side)   HPI  Fall with minor injury Fall on Sunday.  Hit Upper back below R shoulder blade and has pulsing/throbbing pain.  Doesn't think he has broken ribs because it "doesn't hurt to breathe at all."  Larey Seat coming down stairs. Possible twist with fall.  R hand reached toward rail, missed it and fell. Because he is right-handed, he states he was depending more on his R arm to catch him, so when he missed the rail he fell.  He did take a muscle relaxant and it helped him sleep and relax his back.  He did not hit his head.  He states his BP is up r/t pain.  He does check it at home.  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  He cannot feel steps.  His most recent fall prior to this fall was 4 weeks ago.  His neuropathy makes it hard to walk around the block with his wife.  He states he has absolutely no stability on his feet.  Neuropathy His BLE neuropathy has exhibited worsened tingling since knee replacement and even somewhat worse RLE after 2nd replacement of R knee.  He also has tingling in his hands up to elbow from an injury involving a glass laceration several years ago.  Diabetes Mellitus Type 2 He has not achieved any better glucose control since his last visit.   CBG - around 190-200 at night.  140-150 if no eating after 8pm.  He states it is the same since starting trulicity. Pt denies polydipsia, polyphagia, polyuria, headaches, diaphoresis, shakiness, chills, and changes in vision.    Social History  Substance Use Topics  . Smoking status: Former Smoker    Packs/day: 2.00    Years: 20.00    Types: Cigarettes    Quit date: 06/28/1996  . Smokeless tobacco: Never Used  . Alcohol use 0.0 oz/week     Comment:  occasional    Review of Systems Per HPI unless specifically indicated above     Objective:    BP (!) 185/91   Pulse 81   Temp 98.8 F (37.1 C) (Oral)   Resp 16   Ht  (2.057 m)   Wt (!) 379 lb (171.9 kg)   BMI 40.61 kg/m   Wt Readings from Last 3 Encounters:  09/03/16 (!) 379 lb (171.9 kg)  08/11/16 (!) 379 lb (171.9 kg)  04/12/16 (!) 374 lb (169.6 kg)    Physical Exam  Constitutional: He is oriented to person, place, and time. He appears well-developed and well-nourished. No distress.  HENT:  Head: Normocephalic and atraumatic.  Neck: Normal range of motion. Neck supple. No JVD present. No tracheal deviation present.  Cardiovascular: Normal rate, regular rhythm and normal heart sounds.   Pulmonary/Chest: Effort normal and breath sounds normal. No respiratory distress.  Abdominal: Soft. Bowel sounds are normal.  Musculoskeletal:       Thoracic back: He exhibits decreased range of motion and tenderness.       Back:  Pain elicited with Left and right lateral bend and left lateral bend with arm raised.  He is able to move his arm/shoulder and move his leg at the hip  without pain.  Lymphadenopathy:    He has no cervical adenopathy.  Neurological: He is alert and oriented to person, place, and time.  Feet numb decreased 2-point discrimination to mid shin (at sock-line).  Tingling of hands from fingers to wrist, some up to elbow  Positive romberg  Skin: Skin is warm and dry.  Psychiatric: He has a normal mood and affect. His behavior is normal. Judgment and thought content normal.     Results for orders placed or performed in visit on 08/11/16  POCT HgB A1C  Result Value Ref Range   Hemoglobin A1C 9.1       Assessment & Plan:   Problem List Items Addressed This Visit    None    Visit Diagnoses    Fall (on) (from) other stairs and steps, initial encounter    -  Primary Fall from stairs (only 3-4 steps) to garage floor.  Minor injury incurred.  Plan: 1.  Evaluate further causative factors (neuropathy) and work to manage conditions that can worsen neuropathy like diabetes and hypertension. 2. Referral to PT for home PT evaluation.  Possible to have ongoing PT occur in an outpatient setting rather than in the home.   Relevant Orders   Ambulatory referral to Physical Therapy   Acute right-sided thoracic back pain     Improving slowly.  Some spasm noted and prior pain relief with muscle relaxant.  Plan: 1. Start baclofen 10 mg tablet up to 3 times daily as needed.  Cautioned patient that it would likely impair his balance. 2. Treat with NSAIDs (acetaminophen and ibuprofen) for 1 week.  After 1 week avoid ibuprofen.    Discussed alternate dosing and max dosing. 3. Apply heat and/or ice to affected area. 4. May also apply a muscle rub with lidocaine after heat or ice.   Relevant Medications   baclofen (LIORESAL) 10 MG tablet   Neuropathy     Stable and ongoing.  Complicates mobility.  Plan: 1. Referral to PT for evaluation and treatment.   2. Referral to Neurology for any additional input.  Patient preferred this route for managing the tingling in his hands and would welcome a second opinion regarding his feet. 3. Continue amitriptyline for neuropathy as previously prescribed.  Take 1/2 pill daily at bedtime.   Relevant Orders   Ambulatory referral to Neurology   Ambulatory referral to Physical Therapy      Meds ordered this encounter  Medications  . baclofen (LIORESAL) 10 MG tablet    Sig: Take 1 tablet (10 mg total) by mouth 3 (three) times daily.    Dispense:  30 each    Refill:  0    Order Specific Question:   Supervising Provider    Answer:   Smitty Cords [[id number]]    Follow up plan: Return if symptoms worsen or fail to improve and with Dr. Kirtland Bouchard as scheduled.Wilhelmina Mcardle, DNP, AGPCNP-BC Adult Gerontology Primary Care Nurse Practitioner [facility] 09/05/2016, 8:09 PM

## 2016-09-03 NOTE — Patient Instructions (Signed)
[name], Thank you for coming in to clinic today.  1. For your neuropathy: - Continue your amitriptyline as previously. - Referral to neurology at [facility]. - PT eval at home for recommendations.  Possible to treat in outpatient center.  2. For your back pain: - Take Baclofen 10 mg one tablet up to 3 times per day for 10 days. - Start taking Tylenol extra strength 1 to 2 tablets every 6-8 hours for aches or fever/chills for next few days as needed.  Do not take more than 3,000 mg in 24 hours from all medicines.  May take Ibuprofen as well if tolerated 200-400mg  every 8 hours as needed. Stop both medications after 1 week.  Then stop ibuprofen completely because it can raise your blood pressure.  Alternate both medications in the same 24 hour period. - Use heat and ice.  Apply this for 15 minutes at a time 6-8 times per day.   - Muscle rub with lidocaine.  Avoid using this with heat and ice to avoid burns.   Please schedule a follow-up appointment with Wilhelmina Mcardle, AGNP in 5-7 if symptoms worsen and with Dr. Charm Rings regular follow up.  If you have any other questions or concerns, please feel free to call the clinic or send a message through MyChart. You may also schedule an earlier appointment if necessary.  Wilhelmina Mcardle, DNP, AGNP-BC Adult Gerontology Nurse Practitioner [facility], [facility]    Peripheral Neuropathy Peripheral neuropathy is a type of nerve damage. It affects nerves that carry signals between the spinal cord and other parts of the body. These are called peripheral nerves. With peripheral neuropathy, one nerve or a group of nerves may be damaged. What are the causes? Many things can damage peripheral nerves. For some people with peripheral neuropathy, the cause is unknown. Some causes include:  Diabetes. This is the most common cause of peripheral neuropathy.  Injury to a nerve.  Pressure or stress on a nerve that lasts a long time.  Too  little vitamin B. Alcoholism can lead to this.  Infections.  Autoimmune diseases, such as multiple sclerosis and systemic lupus erythematosus.  Inherited nerve diseases.  Some medicines, such as cancer drugs.  Toxic substances, such as lead and mercury.  Too little blood flowing to the legs.  Kidney disease.  Thyroid disease. What are the signs or symptoms? Different people have different symptoms. The symptoms you have will depend on which of your nerves is damaged. Common symptoms include:  Loss of feeling (numbness) in the feet and hands.  Tingling in the feet and hands.  Pain that burns.  Very sensitive skin.  Weakness.  Not being able to move a part of the body (paralysis).  Muscle twitching.  Clumsiness or poor coordination.  Loss of balance.  Not being able to control your bladder.  Feeling dizzy.  Sexual problems. How is this diagnosed? Peripheral neuropathy is a symptom, not a disease. Finding the cause of peripheral neuropathy can be hard. To figure that out, your health care provider will take a medical history and do a physical exam. A neurological exam will also be done. This involves checking things affected by your brain, spinal cord, and nerves (nervous system). For example, your health care provider will check your reflexes, how you move, and what you can feel. Other types of tests may also be ordered, such as:  Blood tests.  A test of the fluid in your spinal cord.  Imaging tests, such as  CT scans or an MRI.  Electromyography (EMG). This test checks the nerves that control muscles.  Nerve conduction velocity tests. These tests check how fast messages pass through your nerves.  Nerve biopsy. A small piece of nerve is removed. It is then checked under a microscope. How is this treated?  Medicine is often used to treat peripheral neuropathy. Medicines may include:  Pain-relieving medicines. Prescription or over-the-counter medicine may be  suggested.  Antiseizure medicine. This may be used for pain.  Antidepressants. These also may help ease pain from neuropathy.  Lidocaine. This is a numbing medicine. You might wear a patch or be given a shot.  Mexiletine. This medicine is typically used to help control irregular heart rhythms.  Surgery. Surgery may be needed to relieve pressure on a nerve or to destroy a nerve that is causing pain.  Physical therapy to help movement.  Assistive devices to help movement. Follow these instructions at home:  Only take over-the-counter or prescription medicines as directed by your health care provider. Follow the instructions carefully for any given medicines. Do not take any other medicines without first getting approval from your health care provider.  If you have diabetes, work closely with your health care provider to keep your blood sugar under control.  If you have numbness in your feet:  Check every day for signs of injury or infection. Watch for redness, warmth, and swelling.  Wear padded socks and comfortable shoes. These help protect your feet.  Do not do things that put pressure on your damaged nerve.  Do not smoke. Smoking keeps blood from getting to damaged nerves.  Avoid or limit alcohol. Too much alcohol can cause a lack of B vitamins. These vitamins are needed for healthy nerves.  Develop a good support system. Coping with peripheral neuropathy can be stressful. Talk to a mental health specialist or join a support group if you are struggling.  Follow up with your health care provider as directed. Contact a health care provider if:  You have new signs or symptoms of peripheral neuropathy.  You are struggling emotionally from dealing with peripheral neuropathy.  You have a fever. Get help right away if:  You have an injury or infection that is not healing.  You feel very dizzy or begin vomiting.  You have chest pain.  You have trouble breathing. This  information is not intended to replace advice given to you by your health care provider. Make sure you discuss any questions you have with your health care provider. Document Released: 04/23/2002 Document Revised: 10/09/2015 Document Reviewed: 01/08/2013 Elsevier Interactive Patient Education  2017 [employer].

## 2016-09-06 NOTE — Progress Notes (Signed)
I have reviewed this encounter including the documentation in this note and/or discussed this patient with the provider, Wilhelmina Mcardle, AGPCNP-BC. I am certifying that I agree with the content of this note as supervising physician.  Saralyn Pilar, DO [facility] 09/06/2016, 5:52 PM

## 2016-09-07 ENCOUNTER — Other Ambulatory Visit: Payer: Self-pay | Admitting: Nurse Practitioner

## 2016-09-07 DIAGNOSIS — W108XXA Fall (on) (from) other stairs and steps, initial encounter: Secondary | ICD-10-CM

## 2016-09-08 ENCOUNTER — Telehealth: Payer: Self-pay

## 2016-09-08 NOTE — Telephone Encounter (Signed)
Suli,  He already has a referral to neurology and it has been approved for Dr. Sherryll Burger.  He will need to call that office to change or reschedule his appointment if they have not already called him.  I have completed a PT referral for outpatient physical therapy at [facility].  I think Nisha mailed it to his home today.  He will take this order to any [facility] location and they will evaluate and treat him.

## 2016-09-08 NOTE — Telephone Encounter (Signed)
Patient called to report that he was contacted by PT and was told that they would not come to his house due to patient working. Patient is requesting neurology referral made in town [place].

## 2016-09-09 NOTE — Telephone Encounter (Signed)
LMTCB

## 2016-09-09 NOTE — Telephone Encounter (Signed)
Pt has appointment bot place PT and neurology pt is aware of date and time.

## 2016-09-13 ENCOUNTER — Other Ambulatory Visit: Payer: Self-pay

## 2016-09-13 MED ORDER — GLUCOSE BLOOD VI STRP: 1.0000 | ORAL_STRIP | Freq: Four times a day (QID) | 12 refills | Status: DC

## 2016-09-13 NOTE — Telephone Encounter (Signed)
Pharmacy requesting refill Last ov 09/03/16 Last filled 07/28/15 Please review. Thank you. sd

## 2016-09-16 ENCOUNTER — Other Ambulatory Visit: Payer: Self-pay

## 2016-09-16 MED ORDER — ONETOUCH LANCETS MISC: 1.0000 | Freq: Four times a day (QID) | 12 refills | Status: DC

## 2016-09-16 NOTE — Telephone Encounter (Signed)
Pharmacy requesting refill Last ov 09/03/16 Last filled 07/28/15

## 2016-09-17 ENCOUNTER — Encounter: Payer: Self-pay | Admitting: Family Medicine

## 2016-09-17 LAB — HM DIABETES EYE EXAM

## 2016-09-30 DIAGNOSIS — E1140 Type 2 diabetes mellitus with diabetic neuropathy, unspecified: Secondary | ICD-10-CM | POA: Insufficient documentation

## 2016-09-30 HISTORY — DX: Type 2 diabetes mellitus with diabetic neuropathy, unspecified: E11.40

## 2016-10-02 ENCOUNTER — Ambulatory Visit (INDEPENDENT_AMBULATORY_CARE_PROVIDER_SITE_OTHER): Payer: BC Managed Care – PPO | Admitting: Family Medicine

## 2016-10-02 ENCOUNTER — Encounter: Payer: Self-pay | Admitting: Family Medicine

## 2016-10-02 VITALS — BP 128/88 | HR 97 | Temp 98.7°F | Resp 18 | Ht >= 80 in | Wt 337.8 lb

## 2016-10-02 DIAGNOSIS — Z794 Long term (current) use of insulin: Secondary | ICD-10-CM

## 2016-10-02 DIAGNOSIS — E669 Obesity, unspecified: Secondary | ICD-10-CM | POA: Diagnosis not present

## 2016-10-02 DIAGNOSIS — M791 Myalgia, unspecified site: Secondary | ICD-10-CM

## 2016-10-02 DIAGNOSIS — G6289 Other specified polyneuropathies: Secondary | ICD-10-CM | POA: Diagnosis not present

## 2016-10-02 DIAGNOSIS — Z87828 Personal history of other (healed) physical injury and trauma: Secondary | ICD-10-CM

## 2016-10-02 DIAGNOSIS — Z6836 Body mass index (BMI) 36.0-36.9, adult: Secondary | ICD-10-CM | POA: Diagnosis not present

## 2016-10-02 DIAGNOSIS — E1142 Type 2 diabetes mellitus with diabetic polyneuropathy: Secondary | ICD-10-CM | POA: Diagnosis not present

## 2016-10-02 NOTE — Progress Notes (Signed)
Subjective:  By signing my name below, I, Nathan Dean, attest that this documentation has been prepared under the direction and in the presence of Nathan Ray, MD. Electronically Signed: [name], [place]. 10/02/2016 , 10:49 AM .  Patient was seen in Room 1 .   Patient ID: Nathan Dean, male    DOB: [birth year], 61 y.o.   MRN: [id number] Chief Complaint  Patient presents with  . Annual Exam   HPI Nathan Dean is a 61 y.o. male Here to establish care. He is a new patient to me. He has history of multiple medical problems including DM with neuropathy, HTN, HLD, depression, peripheral neuropathy, and sleep apnea. He's previously followed by Dr. Parks Ranger, out at [facility]. His last office visit on Mar 28th. 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   Burning pain and numbness in hands bilaterally He reports carrying a plate of spaghetti and tripped and fell into pyrex, causing cuts into the base of his right hand and few cuts into the palm of his left hand; occurred 5 years ago. He had minor surgery to remove the glass in his right hand. He's been having numbness in his hands bilaterally with pain for the last 4 years. He hasn't spoken to his neurologist about this.   Neurology He was last seen by neurologist, Dr. Gurney Nathan, on May 15th (4 days ago). He was previously on amitriptyline switched to gabapentin 356m BID. He states the gabapentin has improved the feeling in his feet. His next appointment with neurologist is in about 4 weeks.   Lower Back pain He mentions taking tramadol about once a week for his lower back pain. He would take tylenol to help with the pain, and rare advil. He denies history of kidney issues. He has history of back surgery in 1980s on herniated disc and bone spur in L4 [facility] He also has history of bilateral knee replacement; right knee  surgery 20 years ago, and 5 years ago replacement.   Right ankle rolling - falls He's been having falls onto pavements, occurred as much as about 6 times in 3 months. He noticed if he turns too quickly, his right ankle would roll. He denies lightheadedness or dizziness with it. He hasn't worn a brace for this. He notes this hasn't occurred in the past few months.   Diabetes He's on insulin for DM, previously managed by his previous PCP. He started taking Vitamin B-12 a week ago.   Patient Active Problem List   Diagnosis Date Noted  . Depression 06/03/2015  . HTN (hypertension) 01/02/2015  . Hyperlipidemia 01/02/2015  . 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 Sleep apnea 09/21/2010  . Arthritis, degenerative 02/17/2009  . Hereditary and idiopathic peripheral neuropathy 11/21/2008  . AD (atopic dermatitis) 07/08/2008  . Acid reflux 04/05/2007   Past Medical History:  Diagnosis Date  . Diabetes ([facility]   . Hyperlipidemia   . Hypertension    Past Surgical History:  Procedure Laterality Date  . REPLACEMENT TOTAL KNEE BILATERAL  2014   left 2012 rt 2014   Allergies  Allergen Reactions  . Penicillins Anaphylaxis, Rash and Other (See Comments)  . Succinylcholine Chloride Anaphylaxis and Rash  . Keflex [Cephalexin] Rash  . Sulfa Antibiotics Rash and Other (See Comments)   Prior to Admission medications   Medication Sig Start  Date End Date Taking? Authorizing Provider  albuterol (PROVENTIL HFA;VENTOLIN HFA) 108 (90 BASE) MCG/ACT inhaler Inhale 2 puffs into the lungs every 6 (six) hours as needed for wheezing or shortness of breath. 04/15/15  Yes Arlis Porta., MD  amLODipine (NORVASC) 10 MG tablet Take 0.5 tablets (5 mg total) by mouth daily. 08/11/16  Yes Karamalegos, Devonne Doughty, DO  aspirin 81 MG tablet Take 1 tablet (81 mg total) by mouth daily. 08/11/16  Yes Karamalegos, Devonne Doughty, DO  Dean Glucose Monitoring Suppl (ONE TOUCH  ULTRA SYSTEM KIT) w/Device KIT 1 kit by Does not apply route once. Dx. E11.9 07/28/15  Yes Arlis Porta., MD  carvedilol (COREG) 6.25 MG tablet take 1 tablet by mouth twice a day with meals 08/10/16  Yes Arlis Porta., MD  Cholecalciferol (VITAMIN D3) 5000 UNITS CAPS Take 10,000 Units by mouth daily.    Yes [provider]  glucose Dean test strip 1 each by Other route 4 (four) times daily. Use with One touch meter to check Dean sugar. Dx E11.9 09/13/16  Yes Mikey College, NP  hydrochlorothiazide (MICROZIDE) 12.5 MG capsule Take 1 capsule (12.5 mg total) by mouth daily. 09/01/16  Yes Karamalegos, Devonne Doughty, DO  Insulin Pen Needle (BD PEN NEEDLE NANO U/F) 32G X 4 MM MISC Inject 1 each as directed 3 (three) times daily. 06/04/16  Yes Arlis Porta., MD  LEVEMIR FLEXTOUCH 100 UNIT/ML Pen inject 54 units subcutaneously twice a day 01/22/16  Yes Arlis Porta., MD  losartan (COZAAR) 100 MG tablet take 1 tablet by mouth once daily 02/19/16  Yes Arlis Porta., MD  metFORMIN (GLUCOPHAGE) 500 MG tablet Take 2 tablets (1,000 mg total) by mouth 2 (two) times daily. 08/16/16  Yes Karamalegos, Devonne Doughty, DO  ONE TOUCH LANCETS MISC 1 each by Does not apply route 4 (four) times daily. Use with one touch meter to check Dean sugar. Dx: E11.9 09/16/16  Yes Mikey College, NP  pravastatin (PRAVACHOL) 20 MG tablet Take 1 tablet (20 mg total) by mouth daily. 08/19/15  Yes Arlis Porta., MD  traMADol Veatrice Bourbon) 50 MG tablet take 1 tablet by mouth every 6 hours if needed for pain 06/28/16  Yes Arlis Porta., MD  amitriptyline (ELAVIL) 100 MG tablet Take 0.5 tablets (50 mg total) by mouth at bedtime. Patient not taking: Reported on 10/02/2016 10/03/15   Luciana Axe, NP  baclofen (LIORESAL) 10 MG tablet Take 1 tablet (10 mg total) by mouth 3 (three) times daily. Patient not taking: Reported on 10/02/2016 09/03/16   Mikey College, NP  Dulaglutide  (TRULICITY) 1.61 WR/6.0AV SOPN Inject 0.75 mg into the skin once a week. If needed, will send new rx higher dose 1.32m pen in 1 to 3 months Patient not taking: Reported on 10/02/2016 08/16/16   KOlin Hauser DO  Elastic Bandages & Supports (ACE ANKLE BRACE LARGE) MISC 1 each by Does not apply route daily. Patient not taking: Reported on 10/02/2016 01/09/16   KLuciana Axe NP  escitalopram (LEXAPRO) 10 MG tablet take 1 tablet by mouth once daily Patient not taking: Reported on 10/02/2016 05/12/16   HArlis Porta, MD  fluticasone (FLOVENT HFA) 110 MCG/ACT inhaler Inhale 1 puff into the lungs 2 (two) times daily. Patient not taking: Reported on 10/02/2016 04/12/16   HArlis Porta, MD  naproxen (NAPROSYN) 500 MG tablet Take 1 tablet (500 mg total)  by mouth 2 (two) times daily with a meal. Patient not taking: Reported on 10/02/2016 10/28/15   Luciana Axe, NP  Omega-3 Fatty Acids (FISH OIL) 1200 MG CAPS Take 2,400 mg by mouth daily.    [provider]  pantoprazole (PROTONIX) 40 MG tablet  05/01/15   [provider]   Social History   Social History  . Marital status: Single    Spouse name: N/A  . Number of children: N/A  . Years of education: N/A   Occupational History  . Not on file.   Social History Main Topics  . Smoking status: Former Smoker    Packs/day: 2.00    Years: 20.00    Types: Cigarettes    Quit date: 06/28/1996  . Smokeless tobacco: Never Used  . Alcohol use 0.0 oz/week     Comment: occasional  . Drug use: No  . Sexual activity: Not on file   Other Topics Concern  . Not on file   Social History Narrative  . No narrative on file   Review of Systems  Constitutional: Negative for fatigue and unexpected weight change.  Eyes: Negative for visual disturbance.  Respiratory: Negative for cough, chest tightness and shortness of breath.   Cardiovascular: Negative for chest pain, palpitations and leg swelling.    Gastrointestinal: Negative for abdominal pain and Dean in stool.  Musculoskeletal: Positive for arthralgias, back pain, myalgias and neck stiffness.  Skin: Positive for wound.  Allergic/Immunologic: Positive for environmental allergies.  Neurological: Positive for weakness and numbness. Negative for dizziness, light-headedness and headaches.       Objective:   Physical Exam  Constitutional: He is oriented to person, place, and time. He appears well-developed and well-nourished.  HENT:  Head: Normocephalic and atraumatic.  Eyes: EOM are normal. Pupils are equal, round, and reactive to light.  Neck: No JVD present. Carotid bruit is not present.  Cardiovascular: Normal rate, regular rhythm and normal heart sounds.   No murmur heard. Pulmonary/Chest: Effort normal and breath sounds normal. He has no rales.  Musculoskeletal: He exhibits no edema.  Trace non pitting edema bilateral ankles, right ankle non tender, no appreciable laxity with Drawer's or Taylor Tilt, some crepitance but good ROM, appears equal to left side Cap refill <1 second, good strength in hands and fingers, negative Tinel's over Guyon's canal, but complains dysesthesia into 4th and 5th fingers  Neurological: He is alert and oriented to person, place, and time.  Skin: Skin is warm and dry.  Psychiatric: He has a normal mood and affect.  Vitals reviewed.   Vitals:   10/02/16 0926 10/02/16 1003  BP: (!) 153/87 128/88  Pulse: 97   Resp: 18   Temp: 98.7 F (37.1 C)   TempSrc: Oral   SpO2: 99%   Weight: (!) 337 lb 12.8 oz (153.2 kg)   Height: _0  (2.057 m)    Body mass index is 36.2 kg/m.     Assessment & Plan:   Quaid Yeakle is a 61 y.o. male Other polyneuropathy  - Recently started on gabapentin. May help both lower strandy symptoms and possibly hand symptoms if that is due to peripheral neuropathy. 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 Cervical source discussed versus compression  at Guyon's canal.  -Continue gabapentin, may need titration of dose, consider cervical imaging at follow-up visit if persistent symptoms in hands  Type 2 diabetes mellitus with diabetic polyneuropathy, with long-term current use of insulin (HCC)  -Recently started on gabapentin for  neuropathy. Will follow-up to review diabetes regimen and review of previous notes.  Myalgia  -There is arthralgias with history of osteoarthritis. Some weight related. Can try over-the-counter chondroitin glucosamine to see if that helps symptoms in the next few months. Tylenol okay, avoid NSAIDs as much as possible.  Class 2 obesity with body mass index (BMI) of 36.0 to 36.9 in adult, unspecified obesity type, unspecified whether serious comorbidity present  -Pool-based exercise may be tolerated better with his lower extremity issues  History of ankle sprain - Plan: Ambulatory referral to Physical Therapy  - Repetitive ankle sprains in past, suspect some relative weakness of muscles of ankle as well as need for proprioceptive training. Refer to physical therapy. Based on infrequent nature of symptoms, decided against bracing at this point.  Meds ordered this encounter  Medications  . gabapentin (NEURONTIN) 300 MG capsule    Sig: Take 300 mg by mouth 2 (two) times daily.  . vitamin B-12 (CYANOCOBALAMIN) 1000 MCG tablet    Sig: Take 1,000 mcg by mouth daily.  . traMADol (ULTRAM) 50 MG tablet    Sig: Take 50 mg by mouth daily.   Patient Instructions   Nice meeting you today.   Try glucosamine/chondroitin for various arthritis areas. If that does not help in the next 2-3 months, could stop that medication. Tylenol daily ok, try to minimize use of NSAIDS as those can be associated with heart disease.    Burning in hands may be related to diabetes as well.  Gabapentin may help those symptoms as well, but would discuss that medication with neurology as increased dose may be needed. You can discuss hand symptoms  with neurology then as well. Based on location, may also be related to cervical spine. Can image that area if needed if not improving next visit.   For your R ankle rolling and history of falls, it may be helpful to meet with physical therapy for strengthening. Get out of the car slowly to lessen risk of it rolling for now. Bracing is also an option, but that can also contribute to weakness.   Follow-up in the next 4-6 weeks, and we can review your other medications and health maintenance items at that time. Additionally can schedule a physical in the next 3-6 months.   IF you received an x-Dean today, you will receive an invoice from [facility]. Please contact [facility] at [phone] with questions or concerns regarding your invoice.   IF you received labwork today, you will receive an invoice from [facility]. Please contact [name] at [phone] with questions or concerns regarding your invoice.   Our billing staff will not be able to assist you with questions regarding bills from these companies.  You will be contacted with the lab results as soon as they are available. The fastest way to get your results is to activate your [name] account. Instructions are located on the last page of this paperwork. If you have not heard from [name] regarding the results in 2 weeks, please contact this office.       I personally performed the services described in this documentation, which was scribed in my presence. The recorded information has been reviewed and considered for accuracy and completeness, addended by me as needed, and agree with information above.  Signed,   Nathan Ray, MD [facility].  10/03/16 10:58 AM

## 2016-10-02 NOTE — Patient Instructions (Addendum)
Nice meeting you today.   Try glucosamine/chondroitin for various arthritis areas. If that does not help in the next 2-3 months, could stop that medication. Tylenol daily ok, try to minimize use of NSAIDS as those can be associated with heart disease.    Burning in hands may be related to diabetes as well.  Gabapentin may help those symptoms as well, but would discuss that medication with neurology as increased dose may be needed. You can discuss hand symptoms with neurology then as well. Based on location, may also be related to cervical spine. Can image that area if needed if not improving next visit.   For your R ankle rolling and history of falls, it may be helpful to meet with physical therapy for strengthening. Get out of the car slowly to lessen risk of it rolling for now. Bracing is also an option, but that can also contribute to weakness.   Follow-up in the next 4-6 weeks, and we can review your other medications and health maintenance items at that time. Additionally can schedule a physical in the next 3-6 months.   IF you received an x-ray today, you will receive an invoice from [facility]. Please contact [facility] at [phone] with questions or concerns regarding your invoice.   IF you received labwork today, you will receive an invoice from [facility]. Please contact [name] at [phone] with questions or concerns regarding your invoice.   Our billing staff will not be able to assist you with questions regarding bills from these companies.  You will be contacted with the lab results as soon as they are available. The fastest way to get your results is to activate your [name] account. Instructions are located on the last page of this paperwork. If you have not heard from us regarding the results in 2 weeks, please contact this office.

## 2016-10-12 ENCOUNTER — Encounter: Payer: Self-pay | Admitting: Family Medicine

## 2016-10-13 ENCOUNTER — Ambulatory Visit: Payer: BC Managed Care – PPO | Admitting: Physical Therapy

## 2016-10-18 ENCOUNTER — Other Ambulatory Visit: Payer: Self-pay | Admitting: Family Medicine

## 2016-10-18 ENCOUNTER — Ambulatory Visit: Payer: BC Managed Care – PPO | Admitting: Physical Therapy

## 2016-10-18 DIAGNOSIS — F32A Depression, unspecified: Secondary | ICD-10-CM

## 2016-10-18 DIAGNOSIS — F329 Major depressive disorder, single episode, unspecified: Secondary | ICD-10-CM

## 2016-10-19 ENCOUNTER — Other Ambulatory Visit: Payer: Self-pay | Admitting: Family Medicine

## 2016-10-19 DIAGNOSIS — F32A Depression, unspecified: Secondary | ICD-10-CM

## 2016-10-19 DIAGNOSIS — F329 Major depressive disorder, single episode, unspecified: Secondary | ICD-10-CM

## 2016-10-20 ENCOUNTER — Ambulatory Visit: Payer: BC Managed Care – PPO | Admitting: Physical Therapy

## 2016-10-25 ENCOUNTER — Ambulatory Visit: Payer: BC Managed Care – PPO | Admitting: Physical Therapy

## 2016-10-26 ENCOUNTER — Encounter: Payer: Self-pay | Admitting: Family Medicine

## 2016-10-27 ENCOUNTER — Ambulatory Visit: Payer: BC Managed Care – PPO | Admitting: Physical Therapy

## 2016-11-01 ENCOUNTER — Ambulatory Visit: Payer: BC Managed Care – PPO | Admitting: Physical Therapy

## 2016-11-02 DIAGNOSIS — E1142 Type 2 diabetes mellitus with diabetic polyneuropathy: Secondary | ICD-10-CM

## 2016-11-02 DIAGNOSIS — G5603 Carpal tunnel syndrome, bilateral upper limbs: Secondary | ICD-10-CM | POA: Insufficient documentation

## 2016-11-02 HISTORY — DX: Carpal tunnel syndrome, bilateral upper limbs: G56.03

## 2016-11-02 HISTORY — DX: Type 2 diabetes mellitus with diabetic polyneuropathy: E11.42

## 2016-11-03 ENCOUNTER — Ambulatory Visit: Payer: BC Managed Care – PPO | Admitting: Physical Therapy

## 2016-11-04 ENCOUNTER — Encounter: Payer: Self-pay | Admitting: Family Medicine

## 2016-11-04 ENCOUNTER — Ambulatory Visit (INDEPENDENT_AMBULATORY_CARE_PROVIDER_SITE_OTHER): Payer: BC Managed Care – PPO | Admitting: Family Medicine

## 2016-11-04 VITALS — BP 152/88 | HR 92 | Temp 98.2°F | Resp 18 | Ht >= 80 in | Wt 381.0 lb

## 2016-11-04 DIAGNOSIS — I10 Essential (primary) hypertension: Secondary | ICD-10-CM | POA: Diagnosis not present

## 2016-11-04 DIAGNOSIS — G6289 Other specified polyneuropathies: Secondary | ICD-10-CM | POA: Diagnosis not present

## 2016-11-04 DIAGNOSIS — Z794 Long term (current) use of insulin: Secondary | ICD-10-CM | POA: Diagnosis not present

## 2016-11-04 DIAGNOSIS — Z1159 Encounter for screening for other viral diseases: Secondary | ICD-10-CM

## 2016-11-04 DIAGNOSIS — Z23 Encounter for immunization: Secondary | ICD-10-CM

## 2016-11-04 DIAGNOSIS — E785 Hyperlipidemia, unspecified: Secondary | ICD-10-CM

## 2016-11-04 DIAGNOSIS — E1140 Type 2 diabetes mellitus with diabetic neuropathy, unspecified: Secondary | ICD-10-CM | POA: Diagnosis not present

## 2016-11-04 DIAGNOSIS — Z114 Encounter for screening for human immunodeficiency virus [HIV]: Secondary | ICD-10-CM | POA: Diagnosis not present

## 2016-11-04 MED ORDER — PRAVASTATIN SODIUM 20 MG PO TABS: 20.0000 mg | ORAL_TABLET | Freq: Every day | ORAL | 0 refills | Status: DC

## 2016-11-04 NOTE — Patient Instructions (Addendum)
  I will check into local neurologist office to see if they can perform study sooner.  No change in medications at this time, but monitor your blood pressure outside of office. If that is not remaining under 130/80, would increase the hydrochlorothiazide to 2 pills per day or 25 mg total dose per day.  If A1c is still significantly elevated, may recommend you meet with endocrinologist.  Follow-up in 3 months, sooner if needed. IF you received an x-ray today, you will receive an invoice from [facility]. Please contact [facility] at [phone] with questions or concerns regarding your invoice.   IF you received labwork today, you will receive an invoice from [facility]. Please contact [name] at [phone] with questions or concerns regarding your invoice.   Our billing staff will not be able to assist you with questions regarding bills from these companies.  You will be contacted with the lab results as soon as they are available. The fastest way to get your results is to activate your [name] account. Instructions are located on the last page of this paperwork. If you have not heard from us regarding the results in 2 weeks, please contact this office.

## 2016-11-04 NOTE — Progress Notes (Signed)
Subjective:  By signing my name below, I, Nathan Dean, attest that this documentation has been prepared under the direction and in the presence of Nathan Ray, MD. Electronically Signed: [name], [place]. 11/04/2016 , 8:54 AM .  Patient was seen in Room 26 .   Patient ID: Nathan Dean, male    DOB: 03-31-1956, 61 y.o.   MRN: [id number] Chief Complaint  Patient presents with  . Diabetes    follow up on labs and recheck A1C   HPI Nathan Dean is a 61 y.o. male Here for follow up. Last office visit on May 19th. He is fasting today.   History of polyneuropathy He is followed by neurologist, Dr Melrose Nakayama at [facility], with plan for testing and physical therapy locally. He's had persistent burning pain in hands bilaterally, planned for nerve conduction study locally. He takes gabapentin 34m in the morning, 3025min the afternoon, and 60014mt night, which is new dosing from June 12th recommendations.   On his paperwork, he has possible EMG ordered. His nerve conduction study was pushed back until July 23rd.   DM Lab Results  Component Value Date   HGBA1C 9.1 08/11/2016   Last evaluated by Dr. KarParks Rangere does have diabetic neuropathy. At that visit, metformin 1000m33mD, Levemir 54 units BID, did not tolerate Victoza due to GI intolerance and weight gain. His ophtho is Dr. WoodEllin Mayhew was started on Trulicity 0.751.75ZWe a week injection at prior PCP. He also takes an ARB.   He's been checking sugars at home, getting about 180s. He informs that he is no longer following Dr. KaraParks Ranger[place]. He has recently seen Dr. WoodEllin Mayhewut a month ago.   He agrees to update pneumovax today.   HTN Lab Results  Component Value Date   CREATININE 0.75 (L) 12/23/2014   He takes HCTZ 12.5mg,60mlodipine 5mg, 45m losartan 100mg. 39ms planning on ordering a BP machine at home.   Hyperlipidemia Lab Results  Component Value Date   ALT 21 12/23/2014   AST 28 12/23/2014   ALKPHOS 92 12/23/2014   BILITOT 0.5 12/23/2014   He had future order in place but has not been performed. He does take pravastatin 20mg QD37mHIV/Hep C Screening He will have screening done here today.   Patient Active Problem List   Diagnosis Date Noted  . Depression 06/03/2015  . HTN (hypertension) 01/02/2015  . Hyperlipidemia 01/02/2015  . 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 Sleep apnea 09/21/2010  . Arthritis, degenerative 02/17/2009  . Hereditary and idiopathic peripheral neuropathy 11/21/2008  . AD (atopic dermatitis) 07/08/2008  . Acid reflux 04/05/2007   Past Medical History:  Diagnosis Date  . Diabetes (HCC)   .[place]   . Hypertension    Past Surgical History:  Procedure Laterality Date  . REPLACEMENT TOTAL KNEE BILATERAL  2014   left 2012 rt 2014   Allergies  Allergen Reactions  . Penicillins Anaphylaxis, Rash and Other (See Comments)  . Succinylcholine Chloride Anaphylaxis and Rash  . Keflex [Cephalexin] Rash  . Sulfa Antibiotics Rash and Other (See Comments)   Prior to Admission medications   Medication Sig Start Date End Date Taking? Authorizing Provider  albuterol (PROVENTIL HFA;VENTOLIN HFA) 108 (90 BASE) MCG/ACT inhaler Inhale 2 puffs into the lungs every 6 (six) hours as needed for wheezing or shortness of breath. 04/15/15  Yes Hawkins,Arlis PortamLODipine (NORVASC) 10 MG  tablet Take 0.5 tablets (5 mg total) by mouth daily. 08/11/16  Yes Karamalegos, Devonne Doughty, DO  aspirin 81 MG tablet Take 1 tablet (81 mg total) by mouth daily. 08/11/16  Yes Karamalegos, Devonne Doughty, DO  Dean Glucose Monitoring Suppl (ONE TOUCH ULTRA SYSTEM KIT) w/Device KIT 1 kit by Does not apply route once. Dx. E11.9 07/28/15  Yes Arlis Porta., MD  carvedilol (COREG) 6.25 MG tablet take 1 tablet by mouth twice a day with meals 08/10/16  Yes Arlis Porta., MD  Cholecalciferol (VITAMIN D3) 5000 UNITS CAPS  Take 10,000 Units by mouth daily.    Yes [provider]  Dulaglutide (TRULICITY) 9.39 QZ/0.0PQ SOPN Inject into the skin.   Yes [provider]  glucose Dean test strip 1 each by Other route 4 (four) times daily. Use with One touch meter to check Dean sugar. Dx E11.9 09/13/16  Yes Mikey College, NP  hydrochlorothiazide (MICROZIDE) 12.5 MG capsule Take 1 capsule (12.5 mg total) by mouth daily. 09/01/16  Yes Karamalegos, Devonne Doughty, DO  Insulin Pen Needle (BD PEN NEEDLE NANO U/F) 32G X 4 MM MISC Inject 1 each as directed 3 (three) times daily. 06/04/16  Yes Arlis Porta., MD  LEVEMIR FLEXTOUCH 100 UNIT/ML Pen inject 54 units subcutaneously twice a day 01/22/16  Yes Arlis Porta., MD  losartan (COZAAR) 100 MG tablet take 1 tablet by mouth once daily 02/19/16  Yes Arlis Porta., MD  metFORMIN (GLUCOPHAGE) 500 MG tablet Take 2 tablets (1,000 mg total) by mouth 2 (two) times daily. 08/16/16  Yes Karamalegos, Devonne Doughty, DO  Omega-3 Fatty Acids (FISH OIL) 1200 MG CAPS Take 2,400 mg by mouth daily.   Yes [provider]  ONE TOUCH LANCETS MISC 1 each by Does not apply route 4 (four) times daily. Use with one touch meter to check Dean sugar. Dx: E11.9 09/16/16  Yes Mikey College, NP  pantoprazole (PROTONIX) 40 MG tablet  05/01/15  Yes [provider]  pravastatin (PRAVACHOL) 20 MG tablet Take 1 tablet (20 mg total) by mouth daily. 08/19/15  Yes Arlis Porta., MD  traMADol Veatrice Bourbon) 50 MG tablet Take 50 mg by mouth daily.   Yes [provider]  vitamin B-12 (CYANOCOBALAMIN) 1000 MCG tablet Take 1,000 mcg by mouth daily.   Yes [provider]  gabapentin (NEURONTIN) 300 MG capsule Take 300 mg by mouth 2 (two) times daily. 09/28/16 10/28/16  [provider]   Social History   Social History  . Marital status: Single    Spouse name: N/A  . Number of children: N/A  . Years of education: N/A   Occupational  History  . Not on file.   Social History Main Topics  . Smoking status: Former Smoker    Packs/day: 2.00    Years: 20.00    Types: Cigarettes    Quit date: 06/28/1996  . Smokeless tobacco: Never Used  . Alcohol use 0.0 oz/week     Comment: occasional  . Drug use: No  . Sexual activity: Not on file   Other Topics Concern  . Not on file   Social History Narrative  . No narrative on file   Review of Systems  Constitutional: Negative for fatigue and unexpected weight change.  Eyes: Negative for visual disturbance.  Respiratory: Negative for cough, chest tightness and shortness of breath.   Cardiovascular: Negative for chest pain, palpitations and leg swelling.  Gastrointestinal: Negative  for abdominal pain and Dean in stool.  Neurological: Negative for dizziness, light-headedness and headaches.       Objective:   Physical Exam  Constitutional: He is oriented to person, place, and time. He appears well-developed and well-nourished.  HENT:  Head: Normocephalic and atraumatic.  Eyes: EOM are normal. Pupils are equal, round, and reactive to light.  Neck: No JVD present. Carotid bruit is not present.  Cardiovascular: Normal rate, regular rhythm and normal heart sounds.   No murmur heard. Pulmonary/Chest: Effort normal and breath sounds normal. He has no rales.  Musculoskeletal: He exhibits edema (trace pedal edema bilaterally).  Neurological: He is alert and oriented to person, place, and time.  Skin: Skin is warm and dry.  Psychiatric: He has a normal mood and affect.  Vitals reviewed.   Vitals:   11/04/16 0816 11/04/16 0824  BP: (!) 197/64 (!) 152/88  Pulse: 92   Resp: 18   Temp: 98.2 F (36.8 C)   TempSrc: Oral   SpO2: 95%   Weight: (!) 381 lb (172.8 kg)   Height: 6' 9"  (2.057 m)       Assessment & Plan:   Nathan Dean is a 61 y.o. male Type 2 diabetes mellitus with diabetic neuropathy, with long-term current use of insulin ([place]) - Plan: Hemoglobin A1c,  Care order/instruction:  - Appears uncontrolled. Recheck A1c. Continue same dose of metformin, Levemir, Trulicity at this point. Discussed if persistent difficulty in control may need endocrinology evaluation.    Other polyneuropathy - Plan: Ambulatory referral to Neurology  - Followed by neurologist, but requested EMG/nerve conduction study locally. We'll refer to neurologist for these tests. Advised that specific testing may need to be clarified with his neurologist at [facility] clinic  Essential hypertension  - Elevated in office, plans to check outside of office and if remaining elevated, would increase HCTZ to 25 mg daily. Discussed 130/80 or lower with diabetes.  Hyperlipidemia, unspecified hyperlipidemia type - Plan: Comprehensive metabolic panel, Lipid panel  - Check lipid panel, continue pravastatin at same dose for now. With diabetes, may need to change to rosuvastatin or atorvastatin  Screening for HIV (human immunodeficiency virus) - Plan: HIV antibody  Need for hepatitis C screening test - Plan: Hepatitis C antibody  Need for vaccination against Streptococcus pneumoniae - Plan: Pneumococcal polysaccharide vaccine 23-valent greater than or equal to 2yo subcutaneous/IM  - Pneumovax given with history of diabetes.  Meds ordered this encounter  Medications  . Dulaglutide (TRULICITY) 1.66 AY/3.0ZS SOPN    Sig: Inject into the skin.  . pravastatin (PRAVACHOL) 20 MG tablet    Sig: Take 1 tablet (20 mg total) by mouth daily.    Dispense:  90 tablet    Refill:  0    Patient needs follow up office visit.   Patient Instructions    I will check into local neurologist office to see if they can perform study sooner.  No change in medications at this time, but monitor your Dean pressure outside of office. If that is not remaining under 130/80, would increase the hydrochlorothiazide to 2 pills per day or 25 mg total dose per day.  If A1c is still significantly elevated, may recommend  you meet with endocrinologist.  Follow-up in 3 months, sooner if needed. IF you received an x-Dean today, you will receive an invoice from [facility]. Please contact [facility] at [phone] with questions or concerns regarding your invoice.   IF you received labwork today, you will receive an invoice  from [facility]. Please contact [name] at [phone] with questions or concerns regarding your invoice.   Our billing staff will not be able to assist you with questions regarding bills from these companies.  You will be contacted with the lab results as soon as they are available. The fastest way to get your results is to activate your [name] account. Instructions are located on the last page of this paperwork. If you have not heard from [name] regarding the results in 2 weeks, please contact this office.      I personally performed the services described in this documentation, which was scribed in my presence. The recorded information has been reviewed and considered for accuracy and completeness, addended by me as needed, and agree with information above.  Signed,   Nathan Ray, MD [facility].  11/04/16 1:49 PM

## 2016-11-05 LAB — COMPREHENSIVE METABOLIC PANEL
ALBUMIN: 4.4 g/dL (ref 3.6–4.8)
ALT: 27 IU/L (ref 0–44)
AST: 29 IU/L (ref 0–40)
Albumin/Globulin Ratio: 1.5 (ref 1.2–2.2)
Alkaline Phosphatase: 82 IU/L (ref 39–117)
BUN / CREAT RATIO: 21 (ref 10–24)
BUN: 18 mg/dL (ref 8–27)
Bilirubin Total: 0.4 mg/dL (ref 0.0–1.2)
CO2: 25 mmol/L (ref 20–29)
CREATININE: 0.86 mg/dL (ref 0.76–1.27)
Calcium: 10.2 mg/dL (ref 8.6–10.2)
Chloride: 97 mmol/L (ref 96–106)
GFR, EST AFRICAN AMERICAN: 107 mL/min/{1.73_m2} (ref >59)
GFR, EST NON AFRICAN AMERICAN: 93 mL/min/{1.73_m2} (ref >59)
GLOBULIN, TOTAL: 2.9 g/dL (ref 1.5–4.5)
GLUCOSE: 258 mg/dL — AB (ref 65–99)
Potassium: 4.7 mmol/L (ref 3.5–5.2)
SODIUM: 140 mmol/L (ref 134–144)
TOTAL PROTEIN: 7.3 g/dL (ref 6.0–8.5)

## 2016-11-05 LAB — LIPID PANEL
CHOL/HDL RATIO: 4.1 ratio (ref 0.0–5.0)
Cholesterol, Total: 199 mg/dL (ref 100–199)
HDL: 48 mg/dL (ref >39)
Triglycerides: 405 mg/dL — ABNORMAL HIGH (ref 0–149)

## 2016-11-05 LAB — HEMOGLOBIN A1C
Est. average glucose Bld gHb Est-mCnc: 192 mg/dL
Hgb A1c MFr Bld: 8.3 % — ABNORMAL HIGH (ref 4.8–5.6)

## 2016-11-05 LAB — HIV ANTIBODY (ROUTINE TESTING W REFLEX): HIV SCREEN 4TH GENERATION: NONREACTIVE

## 2016-11-05 LAB — HEPATITIS C ANTIBODY: HEP C VIRUS AB: 0.1 {s_co_ratio} (ref 0.0–0.9)

## 2016-11-08 ENCOUNTER — Ambulatory Visit: Payer: BC Managed Care – PPO | Admitting: Physical Therapy

## 2016-11-10 ENCOUNTER — Ambulatory Visit: Payer: BC Managed Care – PPO | Admitting: Physical Therapy

## 2016-11-15 ENCOUNTER — Encounter: Payer: Self-pay | Admitting: Family Medicine

## 2016-11-15 MED ORDER — DULAGLUTIDE 1.5 MG/0.5ML ~~LOC~~ SOAJ: 1.5000 mg | SUBCUTANEOUS | 1 refills | Status: DC

## 2016-11-15 NOTE — Telephone Encounter (Signed)
See lab notes. Trulicity increased to 1.5 mg per week.

## 2016-11-27 ENCOUNTER — Encounter: Payer: Self-pay | Admitting: Family Medicine

## 2016-11-27 ENCOUNTER — Ambulatory Visit (INDEPENDENT_AMBULATORY_CARE_PROVIDER_SITE_OTHER): Payer: BC Managed Care – PPO | Admitting: Family Medicine

## 2016-11-27 VITALS — BP 164/104 | HR 95 | Temp 98.3°F | Resp 16 | Ht >= 80 in | Wt 385.0 lb

## 2016-11-27 DIAGNOSIS — G5621 Lesion of ulnar nerve, right upper limb: Secondary | ICD-10-CM

## 2016-11-27 DIAGNOSIS — M79642 Pain in left hand: Secondary | ICD-10-CM | POA: Diagnosis not present

## 2016-11-27 DIAGNOSIS — I10 Essential (primary) hypertension: Secondary | ICD-10-CM

## 2016-11-27 DIAGNOSIS — M79641 Pain in right hand: Secondary | ICD-10-CM | POA: Diagnosis not present

## 2016-11-27 DIAGNOSIS — G5600 Carpal tunnel syndrome, unspecified upper limb: Secondary | ICD-10-CM | POA: Diagnosis not present

## 2016-11-27 NOTE — Patient Instructions (Addendum)
I will refer you to hand surgeon. It appears you have some component of carpal tunnel and possible nerve pain from ulnar nerve as well.   Try to increase gabapentin to dose in middle of day (1 in the morning, one midday, 2 at night), then every 3-4 days could try adding 1 additional pill per day (so next would increase to 2 in the morning, one at midday, 2 at night). If you start to have any side effects at higher doses, remain on previous dose.  Wear the brace and avoid repetitive use including typing/keyboarding as much as possible until you are evaluated by the orthopedist/hand specialist.   Blood pressure is elevated today. That can be due to pain, but Keep a record of your blood pressures outside of the office and if remaining over 140/90 - let me know as med changes will be needed.    Return to the clinic or go to the nearest emergency room if any of your symptoms worsen or new symptoms occur.   Carpal Tunnel Syndrome Carpal tunnel syndrome is a condition that causes pain in your hand and arm. The carpal tunnel is a narrow area located on the palm side of your wrist. Repeated wrist motion or certain diseases may cause swelling within the tunnel. This swelling pinches the main nerve in the wrist (median nerve). What are the causes? This condition may be caused by:  Repeated wrist motions.  Wrist injuries.  Arthritis.  A cyst or tumor in the carpal tunnel.  Fluid buildup during pregnancy.  Sometimes the cause of this condition is not known. What increases the risk? This condition is more likely to develop in:  People who have jobs that cause them to repeatedly move their wrists in the same motion, such as Health visitor.  Women.  People with certain conditions, such as: ? Diabetes. ? Obesity. ? An underactive thyroid (hypothyroidism). ? Kidney failure.  What are the signs or symptoms? Symptoms of this condition include:  A tingling feeling in your fingers,  especially in your thumb, index, and middle fingers.  Tingling or numbness in your hand.  An aching feeling in your entire arm, especially when your wrist and elbow are bent for long periods of time.  Wrist pain that goes up your arm to your shoulder.  Pain that goes down into your palm or fingers.  A weak feeling in your hands. You may have trouble grabbing and holding items.  Your symptoms may feel worse during the night. How is this diagnosed? This condition is diagnosed with a medical history and physical exam. You may also have tests, including:  An electromyogram (EMG). This test measures electrical signals sent by your nerves into the muscles.  X-rays.  How is this treated? Treatment for this condition includes:  Lifestyle changes. It is important to stop doing or modify the activity that caused your condition.  Physical or occupational therapy.  Medicines for pain and inflammation. This may include medicine that is injected into your wrist.  A wrist splint.  Surgery.  Follow these instructions at home: If you have a splint:  Wear it as told by your health care provider. Remove it only as told by your health care provider.  Loosen the splint if your fingers become numb and tingle, or if they turn cold and blue.  Keep the splint clean and dry. General instructions  Take over-the-counter and prescription medicines only as told by your health care provider.  Rest your wrist from  any activity that may be causing your pain. If your condition is work related, talk to your employer about changes that can be made, such as getting a wrist pad to use while typing.  If directed, apply ice to the painful area: ? Put ice in a plastic bag. ? Place a towel between your skin and the bag. ? Leave the ice on for 20 minutes, 2-3 times per day.  Keep all follow-up visits as told by your health care provider. This is important.  Do any exercises as told by your health care  provider, physical therapist, or occupational therapist. Contact a health care provider if:  You have new symptoms.  Your pain is not controlled with medicines.  Your symptoms get worse. This information is not intended to replace advice given to you by your health care provider. Make sure you discuss any questions you have with your health care provider. Document Released: 04/30/2000 Document Revised: 09/11/2015 Document Reviewed: 09/18/2014 Elsevier Interactive Patient Education  2017 [employer]    IF you received an x-ray today, you will receive an invoice from [facility]. Please contact [facility] at [phone] with questions or concerns regarding your invoice.   IF you received labwork today, you will receive an invoice from [facility]. Please contact [name] at [phone] with questions or concerns regarding your invoice.   Our billing staff will not be able to assist you with questions regarding bills from these companies.  You will be contacted with the lab results as soon as they are available. The fastest way to get your results is to activate your [name] account. Instructions are located on the last page of this paperwork. If you have not heard from us regarding the results in 2 weeks, please contact this office.

## 2016-11-27 NOTE — Progress Notes (Addendum)
By signing my name below, I, Mesha Guinyard, attest that this documentation has been prepared under the direction and in the presence of [name], MD.  Electronically Signed: Verlee Monte, Medical Scribe. 11/27/16. 1:58 PM.  Subjective:    Patient ID: Nathan Dean, male    DOB: 1956/01/03, 61 y.o.   MRN: [id number]  HPI Chief Complaint  Patient presents with  . Hand Pain    bilateral hand pain, right hand hurts the most, sharp pain, localized in hand,     HPI Comments: Nathan Dean is a 61 y.o. male with a PMHx of DM and peripheral neuropathy who presents to Primary Care at [facility] complaining of bilateral hand pain, R>L. Takes gabapentin 300 mg BID. Regarding DM, his trulicity was just increased from June visit. A1c elevated at 8.3. For peripheral neuropathy, he's followed by Dr. Melrose Nakayama at [facility]. There is a phone note from yesterday as gabapentin, flexeril, and tramadol were not helping for hand and foot pain. Looks like he was referred to ortho for possible carpal tunnel syndrome. EMG with Dr. Brigitte Pulse June 12th.  Pt's had burning/sharp/stabbing pain in his right hand for the last 1.5 months ago, and he also has chronic left hand pain that he "puts up with". Pt takes 1 tab of gabapentin in the morning and night, and 1 tab of tramadol at night.  Pt tried to double up on gabapentin at night and only found relief in his feet, but not his hands. In the past, 50 mg of amitriptyline gave relief of his sxs, But that was many years ago.. Pt has had burning pain shooting in the middle of his hands to his fingers after accidently cutting his hands with glass 6 years ago and since then he's had chronic pain in his hands. Pt had carpal tunnel release surgery done on his left hand over 10-20 years ago without relief of his left hand pain. Currently minimal symptoms and left hand, primarily pain is on his right Pt had a hand study done last week at Dr. Weber Cooks office and doesn't know the  results.   Pt is right hand dominant and he works on computers for a living. Pt hasn't been wearing a brace since his is worn out. He last wore a brace 2 weeks ago at night- hasn't worn it during the day.  As far as blood pressure, he is having some pain right now, has not missed any doses of blood pressure medication. Denies chest pain, dyspnea, focal weakness, headache, dizziness.  Patient Active Problem List   Diagnosis Date Noted  . Depression 06/03/2015  . HTN (hypertension) 01/02/2015  . Hyperlipidemia 01/02/2015  . 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 Sleep apnea 09/21/2010  . Arthritis, degenerative 02/17/2009  . Hereditary and idiopathic peripheral neuropathy 11/21/2008  . AD (atopic dermatitis) 07/08/2008  . Acid reflux 04/05/2007   Past Medical History:  Diagnosis Date  . Diabetes ([place])   . Hyperlipidemia   . Hypertension    Past Surgical History:  Procedure Laterality Date  . REPLACEMENT TOTAL KNEE BILATERAL  2014   left 2012 rt 2014   Allergies  Allergen Reactions  . Penicillins Anaphylaxis, Rash and Other (See Comments)  . Succinylcholine Chloride Anaphylaxis and Rash  . Keflex [Cephalexin] Rash  . Sulfa Antibiotics Rash and Other (See Comments)   Prior to Admission medications   Medication Sig Start Date End Date Taking? Authorizing Provider  albuterol (PROVENTIL HFA;VENTOLIN  HFA) 108 (90 BASE) MCG/ACT inhaler Inhale 2 puffs into the lungs every 6 (six) hours as needed for wheezing or shortness of breath. 04/15/15  Yes Arlis Porta., MD  amLODipine (NORVASC) 10 MG tablet Take 0.5 tablets (5 mg total) by mouth daily. 08/11/16  Yes Karamalegos, Devonne Doughty, DO  aspirin 81 MG tablet Take 1 tablet (81 mg total) by mouth daily. 08/11/16  Yes Karamalegos, Devonne Doughty, DO  Blood Glucose Monitoring Suppl (ONE TOUCH ULTRA SYSTEM KIT) w/Device KIT 1 kit by Does not apply route once. Dx. E11.9 07/28/15  Yes Arlis Porta., MD  carvedilol (COREG) 6.25 MG tablet take 1 tablet by mouth twice a day with meals 08/10/16  Yes Arlis Porta., MD  Cholecalciferol (VITAMIN D3) 5000 UNITS CAPS Take 10,000 Units by mouth daily.    Yes [provider]  Dulaglutide (TRULICITY) 1.5 ML/4.6TK SOPN Inject 1.5 mg into the skin once a week. 11/15/16  Yes Wendie Agreste, MD  gabapentin (NEURONTIN) 300 MG capsule Take 300 mg by mouth 2 (two) times daily. 09/28/16 11/27/16 Yes [provider]  glucose blood test strip 1 each by Other route 4 (four) times daily. Use with One touch meter to check blood sugar. Dx E11.9 09/13/16  Yes Mikey College, NP  hydrochlorothiazide (MICROZIDE) 12.5 MG capsule Take 1 capsule (12.5 mg total) by mouth daily. 09/01/16  Yes Karamalegos, Devonne Doughty, DO  Insulin Pen Needle (BD PEN NEEDLE NANO U/F) 32G X 4 MM MISC Inject 1 each as directed 3 (three) times daily. 06/04/16  Yes Arlis Porta., MD  LEVEMIR FLEXTOUCH 100 UNIT/ML Pen inject 54 units subcutaneously twice a day 01/22/16  Yes Arlis Porta., MD  losartan (COZAAR) 100 MG tablet take 1 tablet by mouth once daily 02/19/16  Yes Arlis Porta., MD  metFORMIN (GLUCOPHAGE) 500 MG tablet Take 2 tablets (1,000 mg total) by mouth 2 (two) times daily. 08/16/16  Yes Karamalegos, Devonne Doughty, DO  Omega-3 Fatty Acids (FISH OIL) 1200 MG CAPS Take 2,400 mg by mouth daily.   Yes [provider]  ONE TOUCH LANCETS MISC 1 each by Does not apply route 4 (four) times daily. Use with one touch meter to check blood sugar. Dx: E11.9 09/16/16  Yes Mikey College, NP  pantoprazole (PROTONIX) 40 MG tablet  05/01/15  Yes [provider]  pravastatin (PRAVACHOL) 20 MG tablet Take 1 tablet (20 mg total) by mouth daily. 11/04/16  Yes Wendie Agreste, MD  traMADol (ULTRAM) 50 MG tablet Take 50 mg by mouth daily.   Yes [provider]  vitamin B-12 (CYANOCOBALAMIN) 1000 MCG tablet Take 1,000 mcg by mouth  daily.   Yes [provider]   Social History   Social History  . Marital status: Single    Spouse name: N/A  . Number of children: N/A  . Years of education: N/A   Occupational History  . Not on file.   Social History Main Topics  . Smoking status: Former Smoker    Packs/day: 2.00    Years: 20.00    Types: Cigarettes    Quit date: 06/28/1996  . Smokeless tobacco: Never Used  . Alcohol use 0.0 oz/week     Comment: occasional  . Drug use: No  . Sexual activity: Not on file   Other Topics Concern  . Not on file   Social History Narrative  . No narrative on file   Review  of Systems  Musculoskeletal: Positive for arthralgias.    Objective:  Physical Exam  Constitutional: He appears well-developed and well-nourished. No distress.  HENT:  Head: Normocephalic and atraumatic.  Eyes: Conjunctivae are normal.  Neck: Neck supple.  Cardiovascular: Normal rate.   Pulmonary/Chest: Effort normal.  Musculoskeletal:  Pain and burning to the 3rd, 4th, and 5th phalanges of the right hand No focal tenderness with palpations of the fingers Does have some tenderness over a healed scar on the hypothenar eminence Positive tinel and phalen at the wrist, but he does complain of pain in the 3rd, and 5th fingers He also has a positive tinel distal to the ulna in area of [place] on right.  Left hand has very mild positive tinel with sxs to 3rd phalynx only Thenar bulk appears to be intact bilaterally  Neurological: He is alert.  Skin: Skin is warm and dry.  Psychiatric: He has a normal mood and affect. His behavior is normal.  Nursing note and vitals reviewed.   Vitals:   11/27/16 1317 11/27/16 1336  BP: (!) 173/105 (!) 162/92  Pulse: 95   Resp: 16   Temp: 98.3 F (36.8 C)   TempSrc: Oral   SpO2: 95%   Weight: (!) 385 lb (174.6 kg)   Height: _0  (2.057 m)   Body mass index is 41.26 kg/m.   Assessment & Plan:   Nathan Dean is a 61 y.o. male Bilateral hand pain  - Plan: Ambulatory referral to Hand Surgery, Splint wrist  Carpal tunnel syndrome, unspecified laterality - Plan: Ambulatory referral to Hand Surgery, Splint wrist  Ulnar neuropathy of right upper extremity - Plan: Ambulatory referral to Hand Surgery, Splint wrist  Essential hypertension  Long-standing pain in left hand, previous carpal tunnel release by history with persistent mild symptoms. Primary area of concern is his right hand which appears to have a combination of possible carpal tunnel/median nerve involvement as well as ulnar nerve symptoms. Does report previous injury to ulnar aspect of hand, but that was 6 years ago and symptoms have primarily bothered him past 6 weeks  - Increase gabapentin 1 and the morning,  I in the afternoon, 2 at night, then every 3-4 days can increase one dose by one pill until side effects or improvement of symptoms.  - Placed in hand brace on right to decrease use. Also advised to decrease repetitive typing or other activities that would flare carpal tunnel. Handout given.  - Refer to hand surgery for evaluation of injection versus surgical treatment. Recent nerve conduction study results will be brought here as well as to his hand surgeon for evaluation.   As far as elevated blood pressure, possible pain component. This was controlled at May 19 visit. Adjusting medication as above, monitor blood pressure outside of office, and RTC precautions if it remains elevated  No orders of the defined types were placed in this encounter.  Patient Instructions   I will refer you to hand surgeon. It appears you have some component of carpal tunnel and possible nerve pain from ulnar nerve as well.   Try to increase gabapentin to dose in middle of day (1 in the morning, one midday, 2 at night), then every 3-4 days could try adding 1 additional pill per day (so next would increase to 2 in the morning, one at midday, 2 at night). If you start to have any side effects at  higher doses, remain on previous dose.  Wear the brace and avoid repetitive use  including typing/keyboarding as much as possible until you are evaluated by the orthopedist/hand specialist.   Return to the clinic or go to the nearest emergency room if any of your symptoms worsen or new symptoms occur.   Carpal Tunnel Syndrome Carpal tunnel syndrome is a condition that causes pain in your hand and arm. The carpal tunnel is a narrow area located on the palm side of your wrist. Repeated wrist motion or certain diseases may cause swelling within the tunnel. This swelling pinches the main nerve in the wrist (median nerve). What are the causes? This condition may be caused by:  Repeated wrist motions.  Wrist injuries.  Arthritis.  A cyst or tumor in the carpal tunnel.  Fluid buildup during pregnancy.  Sometimes the cause of this condition is not known. What increases the risk? This condition is more likely to develop in:  People who have jobs that cause them to repeatedly move their wrists in the same motion, such as Art gallery manager.  Women.  People with certain conditions, such as: ? Diabetes. ? Obesity. ? An underactive thyroid (hypothyroidism). ? Kidney failure.  What are the signs or symptoms? Symptoms of this condition include:  A tingling feeling in your fingers, especially in your thumb, index, and middle fingers.  Tingling or numbness in your hand.  An aching feeling in your entire arm, especially when your wrist and elbow are bent for long periods of time.  Wrist pain that goes up your arm to your shoulder.  Pain that goes down into your palm or fingers.  A weak feeling in your hands. You may have trouble grabbing and holding items.  Your symptoms may feel worse during the night. How is this diagnosed? This condition is diagnosed with a medical history and physical exam. You may also have tests, including:  An electromyogram (EMG). This test measures  electrical signals sent by your nerves into the muscles.  X-rays.  How is this treated? Treatment for this condition includes:  Lifestyle changes. It is important to stop doing or modify the activity that caused your condition.  Physical or occupational therapy.  Medicines for pain and inflammation. This may include medicine that is injected into your wrist.  A wrist splint.  Surgery.  Follow these instructions at home: If you have a splint:  Wear it as told by your health care provider. Remove it only as told by your health care provider.  Loosen the splint if your fingers become numb and tingle, or if they turn cold and blue.  Keep the splint clean and dry. General instructions  Take over-the-counter and prescription medicines only as told by your health care provider.  Rest your wrist from any activity that may be causing your pain. If your condition is work related, talk to your employer about changes that can be made, such as getting a wrist pad to use while typing.  If directed, apply ice to the painful area: ? Put ice in a plastic bag. ? Place a towel between your skin and the bag. ? Leave the ice on for 20 minutes, 2-3 times per day.  Keep all follow-up visits as told by your health care provider. This is important.  Do any exercises as told by your health care provider, physical therapist, or occupational therapist. Contact a health care provider if:  You have new symptoms.  Your pain is not controlled with medicines.  Your symptoms get worse. This information is not intended to replace advice given to  you by your health care provider. Make sure you discuss any questions you have with your health care provider. Document Released: 04/30/2000 Document Revised: 09/11/2015 Document Reviewed: 09/18/2014 Elsevier Interactive Patient Education  2017 [employer].    IF you received an x-ray today, you will receive an invoice from [facility]. Please  contact [facility] at [phone] with questions or concerns regarding your invoice.   IF you received labwork today, you will receive an invoice from [facility]. Please contact [name] at [phone] with questions or concerns regarding your invoice.   Our billing staff will not be able to assist you with questions regarding bills from these companies.  You will be contacted with the lab results as soon as they are available. The fastest way to get your results is to activate your [name] account. Instructions are located on the last page of this paperwork. If you have not heard from [name] regarding the results in 2 weeks, please contact this office.       I personally performed the services described in this documentation, which was scribed in my presence. The recorded information has been reviewed and considered for accuracy and completeness, addended by me as needed, and agree with information above.  Signed,   [name], MD [facility] at [facility].  11/27/16 2:29 PM

## 2016-12-01 ENCOUNTER — Encounter: Payer: Self-pay | Admitting: Family Medicine

## 2016-12-06 ENCOUNTER — Encounter: Payer: Self-pay | Admitting: Family Medicine

## 2016-12-06 ENCOUNTER — Telehealth: Payer: Self-pay | Admitting: Family Medicine

## 2016-12-06 ENCOUNTER — Ambulatory Visit (INDEPENDENT_AMBULATORY_CARE_PROVIDER_SITE_OTHER): Payer: BC Managed Care – PPO | Admitting: Family Medicine

## 2016-12-06 VITALS — BP 135/84 | HR 88 | Temp 98.3°F | Resp 16 | Ht >= 80 in | Wt 380.0 lb

## 2016-12-06 DIAGNOSIS — M79641 Pain in right hand: Secondary | ICD-10-CM

## 2016-12-06 DIAGNOSIS — G5601 Carpal tunnel syndrome, right upper limb: Secondary | ICD-10-CM

## 2016-12-06 DIAGNOSIS — M792 Neuralgia and neuritis, unspecified: Secondary | ICD-10-CM

## 2016-12-06 MED ORDER — TRAMADOL HCL 50 MG PO TABS: 50.0000 mg | ORAL_TABLET | Freq: Four times a day (QID) | ORAL | 0 refills | Status: DC | PRN

## 2016-12-06 NOTE — Telephone Encounter (Signed)
Pt would like tramadol refill that was previously prescribed by a former dr./// advised pt he must come in.// he stated he has been seen for other things and would just like the rx

## 2016-12-06 NOTE — Patient Instructions (Addendum)
  Pain still appears to be carpal tunnel syndrome and possible ulnar nerve irritation. Keep follow-up with specialist next Monday, but in the meantime would recommend increasing morning dose of gabapentin to 2 pills, continue 1 in the afternoon, and 2 at night. In the next 3-4 days if not helping, can change dosing to 2 pills 3 times per day. Tramadol if needed up to every 6 hours.  Return to the clinic or go to the nearest emergency room if any of your symptoms worsen or new symptoms occur.   IF you received an x-ray today, you will receive an invoice from [facility]. Please contact [facility] at [phone] with questions or concerns regarding your invoice.   IF you received labwork today, you will receive an invoice from [facility]. Please contact [name] at [phone] with questions or concerns regarding your invoice.   Our billing staff will not be able to assist you with questions regarding bills from these companies.  You will be contacted with the lab results as soon as they are available. The fastest way to get your results is to activate your [name] account. Instructions are located on the last page of this paperwork. If you have not heard from us regarding the results in 2 weeks, please contact this office.

## 2016-12-06 NOTE — Progress Notes (Signed)
Subjective:  By signing my name below, I, Essence Howell, attest that this documentation has been prepared under the direction and in the presence of Wendie Agreste, MD Electronically Signed: Ladene Artist, ED Scribe 12/06/2016 at 11:59 AM.   Patient ID: [name], male    DOB: 09-28-1955, 61 y.o.   MRN: [id number]  Chief Complaint  Patient presents with  . Medication Refill    tramadol or pain meds for right wrist    HPI Moataz Tavis is a 61 y.o. male who presents to Primary Care at [facility] for follow-up on wrist pain. Most recently seen 7/14 with bilateral hand pain. Thought to have some component of carpel tunnel. Increased gabapentin with plan on increased dose over the few days. Placed in wrist brace and referred to hand surgeon. Had taken Tramadol as need in the past.   Pt reports a gradually worsening shooting, burning pain in his right wrist. He has been taking Gabapentin 300 in the AM and afternoon as well as 600 at night without side-effects. He last took Ultram this morning which was previously prescribed from Dr. [name] in [place]. States he has been compliant with wearing his wrist brace. Pt is right hand dominant. He has an upcoming appointment with [name] at [place] in 1 week.   Patient Active Problem List   Diagnosis Date Noted  . Depression 06/03/2015  . HTN (hypertension) 01/02/2015  . Hyperlipidemia 01/02/2015  . 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 Sleep apnea 09/21/2010  . Arthritis, degenerative 02/17/2009  . Hereditary and idiopathic peripheral neuropathy 11/21/2008  . AD (atopic dermatitis) 07/08/2008  . Acid reflux 04/05/2007   Past Medical History:  Diagnosis Date  . Diabetes ([place])   . Hyperlipidemia   . Hypertension    Past Surgical History:  Procedure Laterality Date  . REPLACEMENT TOTAL KNEE BILATERAL  2014   left 2012 rt 2014   Allergies  Allergen Reactions  . Penicillins  Anaphylaxis, Rash and Other (See Comments)  . Succinylcholine Chloride Anaphylaxis and Rash  . Keflex [Cephalexin] Rash  . Sulfa Antibiotics Rash and Other (See Comments)   Prior to Admission medications   Medication Sig Start Date End Date Taking? Authorizing Provider  albuterol (PROVENTIL HFA;VENTOLIN HFA) 108 (90 BASE) MCG/ACT inhaler Inhale 2 puffs into the lungs every 6 (six) hours as needed for wheezing or shortness of breath. 04/15/15   Arlis Porta., MD  amLODipine (NORVASC) 10 MG tablet Take 0.5 tablets (5 mg total) by mouth daily. 08/11/16   Karamalegos, Devonne Doughty, DO  aspirin 81 MG tablet Take 1 tablet (81 mg total) by mouth daily. 08/11/16   Karamalegos, Devonne Doughty, DO  Blood Glucose Monitoring Suppl (ONE TOUCH ULTRA SYSTEM KIT) w/Device KIT 1 kit by Does not apply route once. Dx. E11.9 07/28/15   Arlis Porta., MD  carvedilol (COREG) 6.25 MG tablet take 1 tablet by mouth twice a day with meals 08/10/16   Arlis Porta., MD  Cholecalciferol (VITAMIN D3) 5000 UNITS CAPS Take 10,000 Units by mouth daily.     [provider]  Dulaglutide (TRULICITY) 1.5 NW/2.9FA SOPN Inject 1.5 mg into the skin once a week. 11/15/16   Wendie Agreste, MD  gabapentin (NEURONTIN) 300 MG capsule Take 300 mg by mouth 2 (two) times daily. 09/28/16 11/27/16  [provider]  glucose blood test strip 1 each by Other route 4 (four) times daily. Use  with One touch meter to check blood sugar. Dx E11.9 09/13/16   Mikey College, NP  hydrochlorothiazide (MICROZIDE) 12.5 MG capsule Take 1 capsule (12.5 mg total) by mouth daily. 09/01/16   Karamalegos, Devonne Doughty, DO  Insulin Pen Needle (BD PEN NEEDLE NANO U/F) 32G X 4 MM MISC Inject 1 each as directed 3 (three) times daily. 06/04/16   Arlis Porta., MD  LEVEMIR FLEXTOUCH 100 UNIT/ML Pen inject 54 units subcutaneously twice a day 01/22/16   Arlis Porta., MD  losartan (COZAAR) 100 MG tablet take 1 tablet by mouth once  daily 02/19/16   Arlis Porta., MD  metFORMIN (GLUCOPHAGE) 500 MG tablet Take 2 tablets (1,000 mg total) by mouth 2 (two) times daily. 08/16/16   Karamalegos, Devonne Doughty, DO  Omega-3 Fatty Acids (FISH OIL) 1200 MG CAPS Take 2,400 mg by mouth daily.    [provider]  ONE TOUCH LANCETS MISC 1 each by Does not apply route 4 (four) times daily. Use with one touch meter to check blood sugar. Dx: E11.9 09/16/16   Mikey College, NP  pantoprazole (PROTONIX) 40 MG tablet  05/01/15   [provider]  pravastatin (PRAVACHOL) 20 MG tablet Take 1 tablet (20 mg total) by mouth daily. 11/04/16   Wendie Agreste, MD  traMADol (ULTRAM) 50 MG tablet Take 50 mg by mouth daily.    [provider]  vitamin B-12 (CYANOCOBALAMIN) 1000 MCG tablet Take 1,000 mcg by mouth daily.    [provider]   Social History   Social History  . Marital status: Single    Spouse name: N/A  . Number of children: N/A  . Years of education: N/A   Occupational History  . Not on file.   Social History Main Topics  . Smoking status: Former Smoker    Packs/day: 2.00    Years: 20.00    Types: Cigarettes    Quit date: 06/28/1996  . Smokeless tobacco: Never Used  . Alcohol use 0.0 oz/week     Comment: occasional  . Drug use: No  . Sexual activity: Not on file   Other Topics Concern  . Not on file   Social History Narrative  . No narrative on file   Review of Systems  Musculoskeletal: Positive for arthralgias.      Objective:   Physical Exam  Constitutional: He is oriented to person, place, and time. He appears well-developed and well-nourished. No distress.  HENT:  Head: Normocephalic and atraumatic.  Eyes: Conjunctivae and EOM are normal.  Neck: Neck supple. No tracheal deviation present.  Cardiovascular: Normal rate.   Pulmonary/Chest: Effort normal. No respiratory distress.  Musculoskeletal: Normal range of motion.  Pain over volar wrist with positive Tinels.  Pain over ulnar wrist with radiating pain to 5th finger. NVI distally with cap refill less than 3 seconds.  Neurological: He is alert and oriented to person, place, and time.  Skin: Skin is warm and dry.  Psychiatric: He has a normal mood and affect. His behavior is normal.  Nursing note and vitals reviewed.  Vitals:   12/06/16 1116  BP: 135/84  Pulse: 88  Resp: 16  Temp: 98.3 F (36.8 C)  TempSrc: Oral  SpO2: 94%  Weight: (!) 380 lb (172.4 kg)  Height: _0  (2.057 m)      Assessment & Plan:   Kavi Almquist is a 61 y.o. male Right hand pain - Plan: traMADol (ULTRAM) 50 MG tablet  Carpal tunnel syndrome of right wrist - Plan: traMADol (ULTRAM) 50 MG tablet  Neuropathic pain - Plan: traMADol (ULTRAM) 50 MG tablet  Still appears to be carpal tunnel syndrome with possible ulnar neuropathy as well. Appointment pending with hand specialist in 1 week. No new injury or symptoms.   - Stressed importance of increasing gabapentin, especially with neuropathic type pain. Will initially increase morning dose to 2 pills, continue midafternoon at 300 mg, continued 2 pills at night. If not improving in 3-4 days, can increase to 2 pills 3 times a day (600 mg 3 times a day).   - Agreed on tramadol temporarily until can reach appropriate dose of gabapentin. Potential side effects discussed. Ultram 50 mg every 6 hours when necessary #20  -RTC precautions  Meds ordered this encounter  Medications  . traMADol (ULTRAM) 50 MG tablet    Sig: Take 1 tablet (50 mg total) by mouth every 6 (six) hours as needed.    Dispense:  20 tablet    Refill:  0   Patient Instructions    Pain still appears to be carpal tunnel syndrome and possible ulnar nerve irritation. Keep follow-up with specialist next Monday, but in the meantime would recommend increasing morning dose of gabapentin to 2 pills, continue 1 in the afternoon, and 2 at night. In the next 3-4 days if not helping, can change dosing to 2 pills 3  times per day. Tramadol if needed up to every 6 hours.  Return to the clinic or go to the nearest emergency room if any of your symptoms worsen or new symptoms occur.   IF you received an x-ray today, you will receive an invoice from [facility]. Please contact [facility] at [phone] with questions or concerns regarding your invoice.   IF you received labwork today, you will receive an invoice from [facility]. Please contact [name] at [phone] with questions or concerns regarding your invoice.   Our billing staff will not be able to assist you with questions regarding bills from these companies.  You will be contacted with the lab results as soon as they are available. The fastest way to get your results is to activate your [name] account. Instructions are located on the last page of this paperwork. If you have not heard from [name] regarding the results in 2 weeks, please contact this office.       I personally performed the services described in this documentation, which was scribed in my presence. The recorded information has been reviewed and considered for accuracy and completeness, addended by me as needed, and agree with information above.  Signed,   [name], MD Primary [facility] at [place].  12/06/16 1:03 PM

## 2016-12-06 NOTE — Telephone Encounter (Signed)
Please disregard. Pt called back to make an appointment.

## 2016-12-20 ENCOUNTER — Encounter (HOSPITAL_BASED_OUTPATIENT_CLINIC_OR_DEPARTMENT_OTHER): Payer: Self-pay | Admitting: *Deleted

## 2016-12-20 ENCOUNTER — Other Ambulatory Visit: Payer: Self-pay | Admitting: Orthopedic Surgery

## 2016-12-26 NOTE — H&P (Signed)
Nathan Dean is an 61 y.o. male.   CC / Reason for Visit: Bilateral hand numbness and tingling and pain HPI: This patient is a 61 year old, right-hand-dominant, with a history of diabetes as well as diabetic neuropathy who indicates that he has been experiencing an increase in bilateral hands including numbness and tingling as well as sharp pain, specifically into his right hand ulnar aspect.  The patient has a history of having had a left open carpal tunnel release 20+ years ago.  He also indicates that he fell 6 years ago on a [employer] dish that lacerated his right palm on the ulnar aspect. He indicates that he takes gabapentin, 1500 mg per day which has not been very helpful at all for the numbness and tingling into his hands.  He saw Dr. Malvin Johns at [facility], who performed a nerve conduction study which indicated that he had severe bilateral carpal tunnel syndrome.  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  The patient is here today for further evaluation and treatment.  Past Medical History:  Diagnosis Date  . Diabetes (H[facility])   . GERD (gastroesophageal reflux disease)   . Hyperlipidemia   . Hypertension   . Sleep apnea     Past Surgical History:  Procedure Laterality Date  . REPLACEMENT TOTAL KNEE BILATERAL  2014   left 2012 rt 2014    Family History  Problem Relation Age of Onset  . Cancer Mother   . Kidney failure Father   . Cancer Brother    Social History:  reports that he quit smoking about 20 years ago. His smoking use included Cigarettes. He has a 40.00 pack-year smoking history. He has never used smokeless tobacco. He reports that he drinks alcohol. He reports that he does not use drugs.  Allergies:  Allergies  Allergen Reactions  . Penicillins Anaphylaxis, Rash and Other (See Comments)  . Succinylcholine Chloride Anaphylaxis and Rash  .  Keflex [Cephalexin] Rash  . Sulfa Antibiotics Rash and Other (See Comments)    No prescriptions prior to admission.    No results found for this or any previous visit (from the past 48 hour(s)). No results found.  Review of Systems  All other systems reviewed and are negative.   There were no vitals taken for this visit. Physical Exam  Constitutional:  WD, WN, NAD HEENT:  NCAT, EOMI Neuro/Psych:  Alert & oriented to person, place, and time; appropriate mood & affect Lymphatic: No generalized UE edema or lymphadenopathy Extremities / MSK:  Both UE are normal with respect to appearance, ranges of motion, joint stability, muscle strength/tone, sensation, & perfusion except as otherwise noted:  The patient's right opponens muscle is somewhat atrophied and flattened.  There is a scar on the ulnar aspect of the palm close to the ulnar distal wrist crease and Guyon's canal.  The patient is capable of making a full fist.  He has a positive Tinel's sign over the carpal tunnel as well as over Guyon's canal.  Light touch sensibility is altered on all digital fingertips but somewhat sharper into the ulnar nerve distribution.  Intrinsic muscle testing was 5/5 with no intrinsic wasting.  FDP of small and ring fingers were 5/5 on manual muscle testing.  Froment's sign was negative.  There is a positive Tinel's sign as well at the elbow over the ulnar nerve.  Monofilament testing done bilaterally thumb through small  finger is 2.83.  Grip strength testing in position 3 right 70, left 100.  Provocative testing such as Phalen's as well as carpal tunnel compression was not performed as the patient has constant numbness and tingling to his digits.   Labs / Xrays:  No radiographic studies obtained today.  Conduction studies performed on 11/22/2016 indicated severe bilateral carpal tunnel syndrome.  Please see the report for further information  Assessment: 1.  Severe bilateral carpal tunnel syndrome,  subjectively worse on the right 2.  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  After careful consideration and deliberation, we decided to proceed with right median and ulnar neuroplasty at the wrist.  This will be an open procedure.  The details of the operative procedure were discussed with the patient.  Questions were invited and answered.  In addition to the goal of the procedure, the risks of the procedure to include but not limited to bleeding; infection; damage to the nerves or blood vessels that could result in bleeding, numbness, weakness, chronic pain, and the need for additional procedures; stiffness; the need for revision surgery; and anesthetic risks were reviewed.  No specific outcome was guaranteed or implied.  Informed consent was obtained.   Glynis Hunsucker A., MD 12/26/2016, 9:41 PM

## 2016-12-27 ENCOUNTER — Other Ambulatory Visit: Payer: Self-pay

## 2016-12-27 ENCOUNTER — Encounter (HOSPITAL_BASED_OUTPATIENT_CLINIC_OR_DEPARTMENT_OTHER)
Admission: RE | Admit: 2016-12-27 | Discharge: 2016-12-27 | Disposition: A | Discharge status: Home / Self Care | Payer: BC Managed Care – PPO | Source: Ambulatory Visit | Attending: Orthopedic Surgery | Admitting: Orthopedic Surgery

## 2016-12-27 DIAGNOSIS — G5603 Carpal tunnel syndrome, bilateral upper limbs: Secondary | ICD-10-CM | POA: Diagnosis not present

## 2016-12-27 DIAGNOSIS — Z794 Long term (current) use of insulin: Secondary | ICD-10-CM | POA: Diagnosis not present

## 2016-12-27 DIAGNOSIS — E1165 Type 2 diabetes mellitus with hyperglycemia: Secondary | ICD-10-CM | POA: Diagnosis not present

## 2016-12-27 DIAGNOSIS — R200 Anesthesia of skin: Secondary | ICD-10-CM | POA: Diagnosis present

## 2016-12-27 DIAGNOSIS — K219 Gastro-esophageal reflux disease without esophagitis: Secondary | ICD-10-CM | POA: Diagnosis not present

## 2016-12-27 DIAGNOSIS — E1142 Type 2 diabetes mellitus with diabetic polyneuropathy: Secondary | ICD-10-CM | POA: Diagnosis not present

## 2016-12-27 DIAGNOSIS — Z87891 Personal history of nicotine dependence: Secondary | ICD-10-CM | POA: Diagnosis not present

## 2016-12-27 DIAGNOSIS — G4730 Sleep apnea, unspecified: Secondary | ICD-10-CM | POA: Diagnosis not present

## 2016-12-27 DIAGNOSIS — Z96653 Presence of artificial knee joint, bilateral: Secondary | ICD-10-CM | POA: Diagnosis not present

## 2016-12-27 DIAGNOSIS — Z6841 Body Mass Index (BMI) 40.0 and over, adult: Secondary | ICD-10-CM | POA: Diagnosis not present

## 2016-12-27 DIAGNOSIS — I10 Essential (primary) hypertension: Secondary | ICD-10-CM | POA: Diagnosis not present

## 2016-12-27 LAB — BASIC METABOLIC PANEL
ANION GAP: 6 (ref 5–15)
BUN: 12 mg/dL (ref 6–20)
CALCIUM: 8.9 mg/dL (ref 8.9–10.3)
CO2: 30 mmol/L (ref 22–32)
Chloride: 102 mmol/L (ref 101–111)
Creatinine, Ser: 0.79 mg/dL (ref 0.61–1.24)
GFR calc non Af Amer: >60 mL/min (ref >60)
Glucose, Bld: 210 mg/dL — ABNORMAL HIGH (ref 65–99)
Potassium: 4.5 mmol/L (ref 3.5–5.1)
SODIUM: 138 mmol/L (ref 135–145)

## 2016-12-27 NOTE — Progress Notes (Signed)
Notified Dr. Autumn PattyEdmond Fitzgerald of [facility]G 210 - ok for surgery.

## 2016-12-28 ENCOUNTER — Ambulatory Visit (HOSPITAL_BASED_OUTPATIENT_CLINIC_OR_DEPARTMENT_OTHER): Payer: BC Managed Care – PPO | Admitting: Anesthesiology

## 2016-12-28 ENCOUNTER — Encounter (HOSPITAL_BASED_OUTPATIENT_CLINIC_OR_DEPARTMENT_OTHER): Payer: Self-pay | Admitting: Anesthesiology

## 2016-12-28 ENCOUNTER — Encounter (HOSPITAL_BASED_OUTPATIENT_CLINIC_OR_DEPARTMENT_OTHER)
Admission: RE | Disposition: A | Discharge status: Home / Self Care | Payer: Self-pay | Source: Ambulatory Visit | Attending: Orthopedic Surgery

## 2016-12-28 ENCOUNTER — Ambulatory Visit (HOSPITAL_BASED_OUTPATIENT_CLINIC_OR_DEPARTMENT_OTHER)
Admission: RE | Admit: 2016-12-28 | Discharge: 2016-12-28 | Disposition: A | Discharge status: Home / Self Care | Payer: BC Managed Care – PPO | Source: Ambulatory Visit | Attending: Orthopedic Surgery | Admitting: Orthopedic Surgery

## 2016-12-28 DIAGNOSIS — Z96653 Presence of artificial knee joint, bilateral: Secondary | ICD-10-CM | POA: Insufficient documentation

## 2016-12-28 DIAGNOSIS — E1142 Type 2 diabetes mellitus with diabetic polyneuropathy: Secondary | ICD-10-CM | POA: Diagnosis not present

## 2016-12-28 DIAGNOSIS — G4730 Sleep apnea, unspecified: Secondary | ICD-10-CM | POA: Insufficient documentation

## 2016-12-28 DIAGNOSIS — K219 Gastro-esophageal reflux disease without esophagitis: Secondary | ICD-10-CM | POA: Insufficient documentation

## 2016-12-28 DIAGNOSIS — Z87891 Personal history of nicotine dependence: Secondary | ICD-10-CM | POA: Insufficient documentation

## 2016-12-28 DIAGNOSIS — I10 Essential (primary) hypertension: Secondary | ICD-10-CM | POA: Insufficient documentation

## 2016-12-28 DIAGNOSIS — Z6841 Body Mass Index (BMI) 40.0 and over, adult: Secondary | ICD-10-CM | POA: Insufficient documentation

## 2016-12-28 DIAGNOSIS — G5603 Carpal tunnel syndrome, bilateral upper limbs: Secondary | ICD-10-CM | POA: Insufficient documentation

## 2016-12-28 DIAGNOSIS — E1165 Type 2 diabetes mellitus with hyperglycemia: Secondary | ICD-10-CM | POA: Insufficient documentation

## 2016-12-28 DIAGNOSIS — Z794 Long term (current) use of insulin: Secondary | ICD-10-CM | POA: Insufficient documentation

## 2016-12-28 HISTORY — DX: Sleep apnea, unspecified: G47.30

## 2016-12-28 HISTORY — DX: Gastro-esophageal reflux disease without esophagitis: K21.9

## 2016-12-28 HISTORY — PX: CARPAL TUNNEL RELEASE: SHX101

## 2016-12-28 LAB — GLUCOSE, CAPILLARY
GLUCOSE-CAPILLARY: 164 mg/dL — AB (ref 65–99)
Glucose-Capillary: 168 mg/dL — ABNORMAL HIGH (ref 65–99)

## 2016-12-28 SURGERY — CARPAL TUNNEL RELEASE
Anesthesia: Monitor Anesthesia Care | Laterality: Right

## 2016-12-28 MED ORDER — LACTATED RINGERS IV SOLN: INTRAVENOUS | Status: DC

## 2016-12-28 MED ORDER — PROPOFOL 500 MG/50ML IV EMUL
INTRAVENOUS | Status: DC | PRN
  Administered 2016-12-28: 50 ug/kg/min via INTRAVENOUS

## 2016-12-28 MED ORDER — ACETAMINOPHEN 325 MG PO TABS: 650.0000 mg | ORAL_TABLET | Freq: Four times a day (QID) | ORAL | Status: DC | PRN

## 2016-12-28 MED ORDER — BUPIVACAINE-EPINEPHRINE (PF) 0.5% -1:200000 IJ SOLN
INTRAMUSCULAR | Status: DC | PRN
  Administered 2016-12-28: 30 mL via PERINEURAL

## 2016-12-28 MED ORDER — OXYCODONE HCL 5 MG PO TABS: 5.0000 mg | ORAL_TABLET | Freq: Four times a day (QID) | ORAL | 0 refills | Status: DC | PRN

## 2016-12-28 MED ORDER — SCOPOLAMINE 1 MG/3DAYS TD PT72: 1.0000 | MEDICATED_PATCH | Freq: Once | TRANSDERMAL | Status: DC | PRN

## 2016-12-28 MED ORDER — ONDANSETRON HCL 4 MG/2ML IJ SOLN: 4.0000 mg | Freq: Once | INTRAMUSCULAR | Status: DC | PRN

## 2016-12-28 MED ORDER — BUPIVACAINE-EPINEPHRINE 0.5% -1:200000 IJ SOLN
INTRAMUSCULAR | Status: DC | PRN
  Administered 2016-12-28: 5 mL

## 2016-12-28 MED ORDER — IBUPROFEN 200 MG PO TABS: 600.0000 mg | ORAL_TABLET | Freq: Four times a day (QID) | ORAL | 0 refills | Status: DC | PRN

## 2016-12-28 MED ORDER — FENTANYL CITRATE (PF) 100 MCG/2ML IJ SOLN
INTRAMUSCULAR | Status: AC
  Filled 2016-12-28: qty 2

## 2016-12-28 MED ORDER — ONDANSETRON HCL 4 MG/2ML IJ SOLN
INTRAMUSCULAR | Status: AC
  Filled 2016-12-28: qty 2

## 2016-12-28 MED ORDER — MIDAZOLAM HCL 2 MG/2ML IJ SOLN
1.0000 mg | INTRAMUSCULAR | Status: DC | PRN
  Administered 2016-12-28: 2 mg via INTRAVENOUS

## 2016-12-28 MED ORDER — ONDANSETRON HCL 4 MG/2ML IJ SOLN
INTRAMUSCULAR | Status: DC | PRN
  Administered 2016-12-28: 4 mg via INTRAVENOUS

## 2016-12-28 MED ORDER — LIDOCAINE 2% (20 MG/ML) 5 ML SYRINGE
INTRAMUSCULAR | Status: AC
  Filled 2016-12-28: qty 5

## 2016-12-28 MED ORDER — CLINDAMYCIN PHOSPHATE 900 MG/50ML IV SOLN
900.0000 mg | INTRAVENOUS | Status: AC
  Administered 2016-12-28: 900 mg via INTRAVENOUS

## 2016-12-28 MED ORDER — MIDAZOLAM HCL 2 MG/2ML IJ SOLN
INTRAMUSCULAR | Status: AC
  Filled 2016-12-28: qty 2

## 2016-12-28 MED ORDER — LIDOCAINE HCL 2 % IJ SOLN
INTRAMUSCULAR | Status: AC
  Filled 2016-12-28: qty 20

## 2016-12-28 MED ORDER — LIDOCAINE HCL 2 % IJ SOLN
INTRAMUSCULAR | Status: DC | PRN
  Administered 2016-12-28: 5 mL

## 2016-12-28 MED ORDER — FENTANYL CITRATE (PF) 100 MCG/2ML IJ SOLN: 25.0000 ug | INTRAMUSCULAR | Status: DC | PRN

## 2016-12-28 MED ORDER — CLINDAMYCIN PHOSPHATE 900 MG/50ML IV SOLN
INTRAVENOUS | Status: AC
  Filled 2016-12-28: qty 50

## 2016-12-28 MED ORDER — FENTANYL CITRATE (PF) 100 MCG/2ML IJ SOLN
50.0000 ug | INTRAMUSCULAR | Status: DC | PRN
  Administered 2016-12-28: 50 ug via INTRAVENOUS

## 2016-12-28 MED ORDER — LACTATED RINGERS IV SOLN
INTRAVENOUS | Status: DC
  Administered 2016-12-28: 11:00:00 via INTRAVENOUS

## 2016-12-28 MED ORDER — BUPIVACAINE-EPINEPHRINE (PF) 0.5% -1:200000 IJ SOLN
INTRAMUSCULAR | Status: AC
  Filled 2016-12-28: qty 30

## 2016-12-28 MED ORDER — LIDOCAINE 2% (20 MG/ML) 5 ML SYRINGE
INTRAMUSCULAR | Status: DC | PRN
  Administered 2016-12-28: 50 mg via INTRAVENOUS

## 2016-12-28 SURGICAL SUPPLY — 45 items
BANDAGE COBAN STERILE 2 (GAUZE/BANDAGES/DRESSINGS) IMPLANT
BLADE MINI RND TIP GREEN BEAV (BLADE) IMPLANT
BLADE SURG 15 STRL LF DISP TIS (BLADE) ×1 IMPLANT
BLADE SURG 15 STRL SS (BLADE) ×2
BNDG COHESIVE 4X5 TAN STRL (GAUZE/BANDAGES/DRESSINGS) ×3 IMPLANT
BNDG ESMARK 4X9 LF (GAUZE/BANDAGES/DRESSINGS) IMPLANT
BNDG GAUZE 1X2.1 STRL (MISCELLANEOUS) IMPLANT
BNDG GAUZE ELAST 4 BULKY (GAUZE/BANDAGES/DRESSINGS) ×3 IMPLANT
CHLORAPREP W/TINT 26ML (MISCELLANEOUS) ×3 IMPLANT
CORD BIPOLAR FORCEPS 12FT (ELECTRODE) IMPLANT
COVER BACK TABLE 60X90IN (DRAPES) ×3 IMPLANT
COVER MAYO STAND STRL (DRAPES) ×3 IMPLANT
CUFF TOURNIQUET SINGLE 18IN (TOURNIQUET CUFF) IMPLANT
DRAIN PENROSE 1/2X12 LTX STRL (WOUND CARE) IMPLANT
DRAPE EXTREMITY T 121X128X90 (DRAPE) ×3 IMPLANT
DRAPE SURG 17X23 STRL (DRAPES) ×3 IMPLANT
DRSG ADAPTIC 3X8 NADH LF (GAUZE/BANDAGES/DRESSINGS) ×3 IMPLANT
DRSG EMULSION OIL 3X3 NADH (GAUZE/BANDAGES/DRESSINGS) ×3 IMPLANT
GAUZE SPONGE 4X4 12PLY STRL LF (GAUZE/BANDAGES/DRESSINGS) ×3 IMPLANT
GLOVE BIO SURGEON STRL SZ7.5 (GLOVE) ×3 IMPLANT
GLOVE BIOGEL PI IND STRL 7.0 (GLOVE) ×1 IMPLANT
GLOVE BIOGEL PI IND STRL 8 (GLOVE) ×1 IMPLANT
GLOVE BIOGEL PI INDICATOR 7.0 (GLOVE) ×2
GLOVE BIOGEL PI INDICATOR 8 (GLOVE) ×2
GLOVE ECLIPSE 6.5 STRL STRAW (GLOVE) ×3 IMPLANT
GOWN STRL REUS W/ TWL LRG LVL3 (GOWN DISPOSABLE) ×2 IMPLANT
GOWN STRL REUS W/TWL LRG LVL3 (GOWN DISPOSABLE) ×4
GOWN STRL REUS W/TWL XL LVL3 (GOWN DISPOSABLE) ×3 IMPLANT
NEEDLE HYPO 25X1 1.5 SAFETY (NEEDLE) IMPLANT
NS IRRIG 1000ML POUR BTL (IV SOLUTION) ×3 IMPLANT
PACK BASIN DAY SURGERY FS (CUSTOM PROCEDURE TRAY) ×3 IMPLANT
PADDING CAST ABS 4INX4YD NS (CAST SUPPLIES)
PADDING CAST ABS COTTON 4X4 ST (CAST SUPPLIES) IMPLANT
RUBBERBAND STERILE (MISCELLANEOUS) IMPLANT
SLING ARM FOAM STRAP XLG (SOFTGOODS) ×3 IMPLANT
STOCKINETTE 6  STRL (DRAPES) ×2
STOCKINETTE 6 STRL (DRAPES) ×1 IMPLANT
SUT VICRYL 4-0 PS2 18IN ABS (SUTURE) ×3 IMPLANT
SUT VICRYL RAPIDE 4-0 (SUTURE) IMPLANT
SUT VICRYL RAPIDE 4/0 PS 2 (SUTURE) IMPLANT
SYR 10ML LL (SYRINGE) IMPLANT
SYR BULB 3OZ (MISCELLANEOUS) ×3 IMPLANT
TOWEL OR 17X24 6PK STRL BLUE (TOWEL DISPOSABLE) ×3 IMPLANT
TOWEL OR NON WOVEN STRL DISP B (DISPOSABLE) ×3 IMPLANT
UNDERPAD 30X30 (UNDERPADS AND DIAPERS) ×3 IMPLANT

## 2016-12-28 NOTE — Transfer of Care (Signed)
Immediate Anesthesia Transfer of Care Note  Patient: Nathan Dean  Procedure(s) Performed: Procedure(s): RIGHT ULNAR AND MEDIAN NEUROPLASTY AT WRIST (Right)  Patient Location: PACU  Anesthesia Type:MAC  Level of Consciousness: awake, alert  and oriented  Airway & Oxygen Therapy: Patient Spontanous Breathing and Patient connected to face mask oxygen  Post-op Assessment: Report given to RN and Post -op Vital signs reviewed and stable  Post vital signs: Reviewed and stable  Last Vitals:  Vitals:   12/28/16 1235 12/28/16 1337  BP:    Pulse: 81 84  Resp: (!) 21 (!) 26  Temp:    SpO2: 97% 94%    Last Pain:  Vitals:   12/28/16 1057  TempSrc: Oral  PainSc: 9       Patients Stated Pain Goal: 5 (27/51/70 0174)  Complications: No apparent anesthesia complications

## 2016-12-28 NOTE — Anesthesia Postprocedure Evaluation (Signed)
Anesthesia Post Note  Patient: Nathan MottGregory Dean  Procedure(s) Performed: Procedure(s) (LRB): RIGHT ULNAR AND MEDIAN NEUROPLASTY AT WRIST (Right)     Patient location during evaluation: PACU Anesthesia Type: Regional Level of consciousness: awake and alert Pain management: pain level controlled Vital Signs Assessment: post-procedure vital signs reviewed and stable Respiratory status: spontaneous breathing, nonlabored ventilation, respiratory function stable and patient connected to nasal cannula oxygen Cardiovascular status: stable and blood pressure returned to baseline Anesthetic complications: no    Last Vitals:  Vitals:   12/28/16 1400 12/28/16 1415  BP: (!) 151/88 137/72  Pulse: 87 77  Resp: 16 19  Temp:    SpO2: 94% 91%    Last Pain:  Vitals:   12/28/16 1400  TempSrc:   PainSc: 0-No pain                 Cecile HearingStephen Edward Ceana Fiala

## 2016-12-28 NOTE — Progress Notes (Signed)
Assisted Dr. Foster with right, ultrasound guided, supraclavicular block. Side rails up, monitors on throughout procedure. See vital signs in flow sheet. Tolerated Procedure well. °

## 2016-12-28 NOTE — Discharge Instructions (Signed)
Discharge Instructions   You have a light dressing on your hand.  You may begin gentle motion of your fingers and hand immediately, but you should not do any heavy lifting or gripping.  Elevate your hand to reduce pain & swelling of the digits.  Ice over the operative site may be helpful to reduce pain & swelling.  DO NOT USE HEAT. Pain medicine has been prescribed for you.  Take ibuprofen 600 mg and Tylenol 650 mg over the counter every 6 hours together. Take prescribed pain medicine as a rescue medicine for severe pain. Leave the dressing in place until the third day after your surgery and then remove it, leaving it open to air.  After the bandage has been removed you may shower, regularly washing the incision and letting the water run over it, but not submerging it (no swimming, soaking it in dishwater, etc.) You may drive a car when you are off of prescription pain medications and can safely control your vehicle with both hands. We will address whether therapy will be required or not when you return to the office. You may have already made your follow-up appointment when we completed your preop visit.  If not, please call our office today or the next business day to make your return appointment for 10-15 days after surgery.   Please call [phone] during normal business hours or [phone] after hours for any problems. Including the following:  - excessive redness of the incisions - drainage for more than 4 days - fever of more than 101.5 F  *Please note that pain medications will not be refilled after hours or on weekends.    Post Anesthesia Home Care Instructions  Activity: Get plenty of rest for the remainder of the day. A responsible individual must stay with you for 24 hours following the procedure.  For the next 24 hours, DO NOT: -Drive a car -Advertising copywriter -Drink alcoholic beverages -Take any medication unless instructed by your physician -Make any legal decisions  or sign important papers.  Meals: Start with liquid foods such as gelatin or soup. Progress to regular foods as tolerated. Avoid greasy, spicy, heavy foods. If nausea and/or vomiting occur, drink only clear liquids until the nausea and/or vomiting subsides. Call your physician if vomiting continues.  Special Instructions/Symptoms: Your throat may feel dry or sore from the anesthesia or the breathing tube placed in your throat during surgery. If this causes discomfort, gargle with warm salt water. The discomfort should disappear within 24 hours.  If you had a scopolamine patch placed behind your ear for the management of post- operative nausea and/or vomiting:  1. The medication in the patch is effective for 72 hours, after which it should be removed.  Wrap patch in a tissue and discard in the trash. Wash hands thoroughly with soap and water. 2. You may remove the patch earlier than 72 hours if you experience unpleasant side effects which may include dry mouth, dizziness or visual disturbances. 3. Avoid touching the patch. Wash your hands with soap and water after contact with the patch.  Regional Anesthesia Blocks  1. Numbness or the inability to move the "blocked" extremity may last from 3-48 hours after placement. The length of time depends on the medication injected and your individual response to the medication. If the numbness is not going away after 48 hours, call your surgeon.  2. The extremity that is blocked will need to be protected until the numbness is gone and the  Strength  has returned. Because you cannot feel it, you will need to take extra care to avoid injury. Because it may be weak, you may have difficulty moving it or using it. You may not know what position it is in without looking at it while the block is in effect.  3. For blocks in the legs and feet, returning to weight bearing and walking needs to be done carefully. You will need to wait until the numbness is entirely gone  and the strength has returned. You should be able to move your leg and foot normally before you try and bear weight or walk. You will need someone to be with you when you first try to ensure you do not fall and possibly risk injury.  4. Bruising and tenderness at the needle site are common side effects and will resolve in a few days.  5. Persistent numbness or new problems with movement should be communicated to the surgeon or the [facility] [phone])/ [facility] [phone]).

## 2016-12-28 NOTE — Op Note (Signed)
12/28/2016  12:44 PM  PATIENT:  Nathan Dean  61 y.o. 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 Grandville Silos, MD  PHYSICIAN ASSISTANT: Morley Kos, OPA-C  ANESTHESIA:  regional and MAC  SPECIMENS:  None  DRAINS:   None  EBL:  less than 50 mL  PREOPERATIVE INDICATIONS:  Nathan Dean is a  61 y.o. male with present with numbness and tingling in the right hand, and electrodiagnostic confirmed distal ulnar and median likely compressive neuropathies, superimposed on baseline peripheral neuropathy  The risks benefits and alternatives were discussed with the patient preoperatively including but not limited to the risks of infection, bleeding, nerve injury, cardiopulmonary complications, the need for revision surgery, among others, and the patient verbalized understanding and consented to proceed.  OPERATIVE IMPLANTS: none  OPERATIVE PROCEDURE:  After receiving prophylactic antibiotics and a regional block, the patient was escorted to the operative theatre and placed in a supine position.  A surgical "time-out" was performed during which the planned procedure, proposed operative site, and the correct patient identity were compared to the operative consent and agreement confirmed by the circulating nurse according to current facility policy.  He reported still being able to feel somewhat the fingertips, so local anesthetic was administered along the course of planned incisions.  Following application of a tourniquet to the operative extremity, the exposed skin was prepped with Chloraprep and draped in the usual sterile fashion.  The limb was exsanguinated with an Esmarch bandage and the tourniquet inflated to approximately 161mHg higher than systolic BP.  A typical volar incision, coursing obliquely across the wrist creases with  an added zigzag component proximally was marked and made sharply with a scalpel.  Subcutaneous taste tissues were dissected with blunt spreading dissection.  The forearm fascia was split in line with the skin incision, extending distally to include the palmar fascia and the transverse carpal ligament.  The median nerve was noticeably narrowed and hemorrhagic underneath the transverse carpal ligament.  The motor branch was followed and found to be free from kinking or obstructions.  TMs canal was then meticulously dissected and opened as well, ensuring complete release of the ulnar neurovascular bundle.  Decompression was carried out from about 4 inches proximal to the wrist crease through the mid palm for both nerves.  Satisfied with the degree of decompression, tourniquet was released, additional hemostasis obtained with bipolar cautery and the skin was closed with 4-0 Vicryl Rapide interrupted sutures.  A light dressing was applied and he was taken to room stable condition.  Disposition: He'll be discharged home today with typical instructions, returning in 10-15 days.

## 2016-12-28 NOTE — Interval H&P Note (Signed)
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 After consideration of risks, benefits and other options for treatment, the patient has consented to  Procedure(s): RIGHT ULNAR AND MEDIAN NEUROPLASTY AT WRIST (Right) as a surgical intervention .  The patient's history has been reviewed, patient examined, no change in status, stable for surgery.  I have reviewed the patient's chart and labs.  Questions were answered to the patient's satisfaction.     Jansen Sciuto A.

## 2016-12-28 NOTE — Anesthesia Preprocedure Evaluation (Addendum)
Anesthesia Evaluation  Patient identified by MRN, date of birth, ID band Patient awake    Reviewed: Allergy & Precautions, NPO status , Patient's Chart, lab work & pertinent test results, reviewed documented beta blocker date and time   Airway Mallampati: II  TM Distance: >3 FB Neck ROM: Full    Dental  (+) Upper Dentures   Pulmonary sleep apnea , former smoker,    Pulmonary exam normal breath sounds clear to auscultation       Cardiovascular hypertension, Pt. on medications and Pt. on home beta blockers Normal cardiovascular exam Rhythm:Regular Rate:Normal     Neuro/Psych PSYCHIATRIC DISORDERS Depression Diabetic peripheral neuropathy   Neuromuscular disease    GI/Hepatic Neg liver ROS, GERD  Controlled and Medicated,  Endo/Other  diabetes, Poorly Controlled, Type 2, Insulin Dependent, Oral Hypoglycemic AgentsMorbid obesityHyperlipidemia  Renal/GU negative Renal ROS  negative genitourinary   Musculoskeletal  (+) Arthritis , Osteoarthritis,    Abdominal (+) + obese,   Peds  Hematology negative hematology ROS (+)   Anesthesia Other Findings   Reproductive/Obstetrics                           Anesthesia Physical Anesthesia Plan  ASA: III  Anesthesia Plan: MAC and Regional   Post-op Pain Management:    Induction:   PONV Risk Score and Plan: 1 and Ondansetron and Dexamethasone  Airway Management Planned: Natural Airway and Nasal Cannula  Additional Equipment:   Intra-op Plan:   Post-operative Plan:   Informed Consent: I have reviewed the patients History and Physical, chart, labs and discussed the procedure including the risks, benefits and alternatives for the proposed anesthesia with the patient or authorized representative who has indicated his/her understanding and acceptance.   Dental advisory given  Plan Discussed with: CRNA, Anesthesiologist and Surgeon  Anesthesia Plan  Comments:         Anesthesia Quick Evaluation

## 2016-12-28 NOTE — Anesthesia Procedure Notes (Signed)
Anesthesia Regional Block: Supraclavicular block   Pre-Anesthetic Checklist: ,, timeout performed, Correct Patient, Correct Site, Correct Laterality, Correct Procedure, Correct Position, site marked, Risks and benefits discussed,  Surgical consent,  Pre-op evaluation,  At surgeon's request and post-op pain management  Laterality: Right  Prep: chloraprep       Needles:  Injection technique: Single-shot  Needle Type: Echogenic Stimulator Needle     Needle Length: 9cm  Needle Gauge: 21   Needle insertion depth: 5 cm   Additional Needles:   Procedures: ultrasound guided,,,,,,,,  Narrative:  Start time: 12/28/2016 11:38 AM Injection made incrementally with aspirations every 5 mL.  Performed by: Personally  Anesthesiologist: Mal AmabileFOSTER, Arley Salamone  Additional Notes: Timeout performed. Patient sedated. Relevant anatomy ID'd using US. Incremental 2-155ml injection of LA with frequent aspiration. Patient tolerated procedure well.

## 2016-12-28 NOTE — Anesthesia Procedure Notes (Signed)
Procedure Name: MAC Date/Time: 12/28/2016 12:54 PM Performed by: Lieutenant Diego Pre-anesthesia Checklist: Patient identified, Timeout performed, Emergency Drugs available, Suction available and Patient being monitored Patient Re-evaluated:Patient Re-evaluated prior to induction Oxygen Delivery Method: Simple face mask Preoxygenation: Pre-oxygenation with 100% oxygen Induction Type: IV induction

## 2016-12-29 ENCOUNTER — Encounter (HOSPITAL_BASED_OUTPATIENT_CLINIC_OR_DEPARTMENT_OTHER): Payer: Self-pay | Admitting: Orthopedic Surgery

## 2016-12-31 ENCOUNTER — Telehealth: Payer: Self-pay | Admitting: Family Medicine

## 2016-12-31 NOTE — Telephone Encounter (Signed)
Pts wife called in asking if her husband can have a refill for lasartin because his pcp already retired & says that he already est care with Neva Seat & made him his new pcp & also [facility] now so they switched over to the [facility] in [place], [place] code [address]  Please Advise

## 2017-01-04 ENCOUNTER — Ambulatory Visit (INDEPENDENT_AMBULATORY_CARE_PROVIDER_SITE_OTHER): Payer: BC Managed Care – PPO | Admitting: Family Medicine

## 2017-01-04 ENCOUNTER — Encounter: Payer: Self-pay | Admitting: Family Medicine

## 2017-01-04 VITALS — BP 142/85 | HR 106 | Temp 98.6°F | Resp 22 | Ht >= 80 in | Wt 377.8 lb

## 2017-01-04 DIAGNOSIS — Z794 Long term (current) use of insulin: Secondary | ICD-10-CM

## 2017-01-04 DIAGNOSIS — E119 Type 2 diabetes mellitus without complications: Secondary | ICD-10-CM

## 2017-01-04 DIAGNOSIS — Z23 Encounter for immunization: Secondary | ICD-10-CM | POA: Diagnosis not present

## 2017-01-04 DIAGNOSIS — E1140 Type 2 diabetes mellitus with diabetic neuropathy, unspecified: Secondary | ICD-10-CM

## 2017-01-04 DIAGNOSIS — I10 Essential (primary) hypertension: Secondary | ICD-10-CM | POA: Diagnosis not present

## 2017-01-04 DIAGNOSIS — E785 Hyperlipidemia, unspecified: Secondary | ICD-10-CM | POA: Diagnosis not present

## 2017-01-04 DIAGNOSIS — Z1211 Encounter for screening for malignant neoplasm of colon: Secondary | ICD-10-CM

## 2017-01-04 NOTE — Progress Notes (Signed)
Pt here for medication refill on all meds. Ran out of Losartan yesterday. Denies headache, dizziness.

## 2017-01-04 NOTE — Progress Notes (Signed)
Subjective:  By signing my name below, I, Nathan Dean, attest that this documentation has been prepared under the direction and in the presence of Nathan Ray, MD. Electronically Signed: [name], [place]. 01/04/2017 , 5:12 PM .  Patient was seen in Room 11 .   Patient ID: Nathan Dean, male    DOB: 03-18-1956, 61 y.o.   MRN: [id number] Chief Complaint  Patient presents with  . Medication Refill   HPI Nathan Dean is a 61 y.o. male Here for medication refill. He had right ulnar and median neuroplasty at wrist with [name] on Aug 14th. He has a follow up appointment next Tuesday, Aug 28th. He was previously followed by Dr. [name].   Diabetes Lab Results  Component Value Date   HGBA1C 8.3 (H) 11/04/2016   No results found for: Derl Barrow  He takes Metformin 1047m BID, levemir 54u BID, neurontin 3082mBID for neuropathy, and Trulicity 1.7.4MOnce a week.   He had Dean work a week ago, before his surgery, with Dean sugar at 164. He denies missing any of his medications. He denies any symptomatic lows. He's been mostly staying under 200- mostly in 170s-180s. He mentions taking neurontin up to 3x a day.   HTN Lab Results  Component Value Date   CREATININE 0.79 12/27/2016   He takes Coreg 6.2575mID, Norvasc 31m33m, Losartan 100mg58mand HCTZ 12.5mg Q2mHe denies any new side effects with his BP medication.   Hyperlipidemia Lab Results  Component Value Date   CHOL 199 11/04/2016   HDL 48 11/04/2016   LDLCALC Comment 11/04/2016   TRIG 405 (H) 11/04/2016   CHOLHDL 4.1 11/04/2016   Lab Results  Component Value Date   ALT 27 11/04/2016   AST 29 11/04/2016   ALKPHOS 82 11/04/2016   BILITOT 0.4 11/04/2016   He takes pravastatin 20mg Q75me denies any new muscle aches or side effects.   GERD He takes omeprazole 40mg QD68mImmunizations He received his flu shot today.    Patient Active Problem List   Diagnosis Date Noted  . Depression  06/03/2015  . HTN (hypertension) 01/02/2015  . Hyperlipidemia 01/02/2015  . 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 (HCC) 08/[place]2016  . Sleep apnea 09/21/2010  . Arthritis, degenerative 02/17/2009  . Hereditary and idiopathic peripheral neuropathy 11/21/2008  . AD (atopic dermatitis) 07/08/2008  . Acid reflux 04/05/2007   Past Medical History:  Diagnosis Date  . Diabetes (HCC)   .[place] (gastroesophageal reflux disease)   . Hyperlipidemia   . Hypertension   . Sleep apnea    Past Surgical History:  Procedure Laterality Date  . CARPAL TUNNEL RELEASE Right 12/28/2016   Procedure: RIGHT ULNAR AND MEDIAN NEUROPLASTY AT WRIST;  Surgeon: ThompsonMilly Jakobocation: [place]: Orthopedics;  Laterality: Right;  . REPLACEMENT TOTAL KNEE BILATERAL  2014   left 2012 rt 2014   Allergies  Allergen Reactions  . Penicillins Anaphylaxis, Rash and Other (See Comments)  . Succinylcholine Chloride Anaphylaxis and Rash  . Keflex [Cephalexin] Rash  . Sulfa Antibiotics Rash and Other (See Comments)   Prior to Admission medications   Medication Sig Start Date End Date Taking? Authorizing Provider  acetaminophen (TYLENOL) 325 MG tablet Take 2 tablets (650 mg total) by mouth every 6 (six) hours as needed for mild pain or moderate pain. 12/28/16   ThompsonMilly Jakobbuterol (PROVENTIL HFA;VENTOLIN HFA) 108 ([phone]  BASE) MCG/ACT inhaler Inhale 2 puffs into the lungs every 6 (six) hours as needed for wheezing or shortness of breath. 04/15/15   Arlis Porta., MD  amLODipine (NORVASC) 10 MG tablet Take 0.5 tablets (5 mg total) by mouth daily. 08/11/16   Karamalegos, Devonne Doughty, DO  aspirin 81 MG tablet Take 1 tablet (81 mg total) by mouth daily. 08/11/16   Karamalegos, Devonne Doughty, DO  Dean Glucose Monitoring Suppl (ONE TOUCH ULTRA SYSTEM KIT) w/Device KIT 1 kit by Does not apply route once. Dx. E11.9 07/28/15   Arlis Porta., MD  carvedilol  (COREG) 6.25 MG tablet take 1 tablet by mouth twice a day with meals 08/10/16   Arlis Porta., MD  Cholecalciferol (VITAMIN D3) 5000 UNITS CAPS Take 10,000 Units by mouth daily.     [provider]  Dulaglutide (TRULICITY) 1.5 OE/4.2PN SOPN Inject 1.5 mg into the skin once a week. 11/15/16   Wendie Agreste, MD  gabapentin (NEURONTIN) 300 MG capsule Take 300 mg by mouth 2 (two) times daily. 09/28/16 12/20/16  [provider]  glucose Dean test strip 1 each by Other route 4 (four) times daily. Use with One touch meter to check Dean sugar. Dx E11.9 09/13/16   Mikey College, NP  hydrochlorothiazide (MICROZIDE) 12.5 MG capsule Take 1 capsule (12.5 mg total) by mouth daily. 09/01/16   Karamalegos, Devonne Doughty, DO  ibuprofen (ADVIL) 200 MG tablet Take 3 tablets (600 mg total) by mouth every 6 (six) hours as needed for moderate pain. 12/28/16   Milly Jakob, MD  Insulin Pen Needle (BD PEN NEEDLE NANO U/F) 32G X 4 MM MISC Inject 1 each as directed 3 (three) times daily. 06/04/16   Arlis Porta., MD  LEVEMIR FLEXTOUCH 100 UNIT/ML Pen inject 54 units subcutaneously twice a day 01/22/16   Arlis Porta., MD  losartan (COZAAR) 100 MG tablet take 1 tablet by mouth once daily 02/19/16   Arlis Porta., MD  metFORMIN (GLUCOPHAGE) 500 MG tablet Take 2 tablets (1,000 mg total) by mouth 2 (two) times daily. 08/16/16   Karamalegos, Devonne Doughty, DO  omeprazole (PRILOSEC) 40 MG capsule Take 40 mg by mouth daily.    [provider]  ONE TOUCH LANCETS MISC 1 each by Does not apply route 4 (four) times daily. Use with one touch meter to check Dean sugar. Dx: E11.9 09/16/16   Mikey College, NP  oxyCODONE (ROXICODONE) 5 MG immediate release tablet Take 1 tablet (5 mg total) by mouth every 6 (six) hours as needed for breakthrough pain. 12/28/16   Milly Jakob, MD  pravastatin (PRAVACHOL) 20 MG tablet Take 1 tablet (20 mg total) by mouth daily. 11/04/16   Wendie Agreste, MD  traMADol (ULTRAM) 50 MG tablet Take 1 tablet (50 mg total) by mouth every 6 (six) hours as needed. 12/06/16   Wendie Agreste, MD  vitamin B-12 (CYANOCOBALAMIN) 1000 MCG tablet Take 1,000 mcg by mouth daily.    [provider]   Social History   Social History  . Marital status: Single    Spouse name: N/A  . Number of children: N/A  . Years of education: N/A   Occupational History  . Not on file.   Social History Main Topics  . Smoking status: Former Smoker    Packs/day: 2.00    Years: 20.00    Types: Cigarettes    Quit date: 06/28/1996  . Smokeless tobacco:  Never Used  . Alcohol use 0.0 oz/week     Comment: occasional  . Drug use: No  . Sexual activity: Not on file   Other Topics Concern  . Not on file   Social History Narrative  . No narrative on file   Review of Systems  Constitutional: Negative for fatigue and unexpected weight change.  Eyes: Negative for visual disturbance.  Respiratory: Negative for cough, chest tightness and shortness of breath.   Cardiovascular: Negative for chest pain, palpitations and leg swelling.  Gastrointestinal: Negative for abdominal pain and Dean in stool.  Neurological: Negative for dizziness, light-headedness and headaches.       Objective:   Physical Exam  Constitutional: He is oriented to person, place, and time. He appears well-developed and well-nourished.  HENT:  Head: Normocephalic and atraumatic.  Eyes: Pupils are equal, round, and reactive to light. EOM are normal.  Neck: No JVD present. Carotid bruit is not present.  Cardiovascular: Normal rate, regular rhythm and normal heart sounds.   No murmur heard. Pulmonary/Chest: Effort normal and breath sounds normal. He has no rales.  Musculoskeletal: He exhibits no edema.  Neurological: He is alert and oriented to person, place, and time.  Skin: Skin is warm and dry.  Psychiatric: He has a normal mood and affect.  Vitals reviewed.   Vitals:    01/04/17 1640  BP: (!) 142/85  Pulse: (!) 106  Resp: (!) 22  Temp: 98.6 F (37 C)  TempSrc: Oral  SpO2: 97%  Weight: (!) 377 lb 12.8 oz (171.4 kg)  Height: _0  (2.057 m)      Assessment & Plan:   Edd Reppert is a 61 y.o. male Essential hypertension - Plan: DISCONTINUED: carvedilol (COREG) 6.25 MG tablet, DISCONTINUED: losartan (COZAAR) 100 MG tablet  - Borderline. Ideal goal less than 130/80 with history of diabetes. Continue same dose of meds for now, can monitor at home/outside of visit.  Flu vaccine need - Plan: Flu Vaccine QUAD 6+ mos PF IM (Fluarix Quad PF) given  Colon cancer screening - Plan: Ambulatory referral to Gastroenterology  Hyperlipidemia, unspecified hyperlipidemia type - Plan: DISCONTINUED: pravastatin (PRAVACHOL) 20 MG tablet  -Tolerating pravastatin, continue same dose.   Type 2 diabetes mellitus with diabetic neuropathy, with long-term current use of insulin (HCC) - Plan: Hemoglobin A1c, DISCONTINUED: gabapentin (NEURONTIN) 300 MG capsule, DISCONTINUED: Dulaglutide (TRULICITY) 1.5 GP/4.9IY SOPN, CANCELED: Hemoglobin A1c Type 2 diabetes mellitus without complication, without long-term current use of insulin (HCC) - Plan: LEVEMIR FLEXTOUCH 100 UNIT/ML Pen  -Continue to increase gabapentin as needed for neuropathic symptoms. Has ongoing follow-up with hand surgeon to determine if neuropathy in hand will improve after surgery or if there is a component of diabetic neuropathy there as well. Continue same doses of insulin, Trulicity, metformin for now. Check A1c as lab only visit next month, then follow up in December for office visit.  Meds refilled.  No orders of the defined types were placed in this encounter.  There are no Patient Instructions on file for this visit. I personally performed the services described in this documentation, which was scribed in my presence. The recorded information has been reviewed and considered for accuracy and completeness,  addended by me as needed, and agree with information above.  Signed,   Nathan Ray, MD [facility].  01/04/17 11:45 PM

## 2017-01-05 ENCOUNTER — Other Ambulatory Visit: Payer: Self-pay | Admitting: Emergency Medicine

## 2017-01-05 ENCOUNTER — Telehealth: Payer: Self-pay | Admitting: Family Medicine

## 2017-01-05 DIAGNOSIS — E785 Hyperlipidemia, unspecified: Secondary | ICD-10-CM

## 2017-01-05 DIAGNOSIS — E1140 Type 2 diabetes mellitus with diabetic neuropathy, unspecified: Secondary | ICD-10-CM

## 2017-01-05 DIAGNOSIS — I10 Essential (primary) hypertension: Secondary | ICD-10-CM

## 2017-01-05 DIAGNOSIS — E1142 Type 2 diabetes mellitus with diabetic polyneuropathy: Secondary | ICD-10-CM

## 2017-01-05 DIAGNOSIS — Z794 Long term (current) use of insulin: Secondary | ICD-10-CM

## 2017-01-05 MED ORDER — LEVEMIR FLEXTOUCH 100 UNIT/ML ~~LOC~~ SOPN: PEN_INJECTOR | SUBCUTANEOUS | 12 refills | Status: DC

## 2017-01-05 MED ORDER — GABAPENTIN 300 MG PO CAPS: 300.0000 mg | ORAL_CAPSULE | Freq: Three times a day (TID) | ORAL | 5 refills | Status: DC

## 2017-01-05 MED ORDER — LOSARTAN POTASSIUM 100 MG PO TABS: 100.0000 mg | ORAL_TABLET | Freq: Every day | ORAL | 1 refills | Status: DC

## 2017-01-05 MED ORDER — DULAGLUTIDE 1.5 MG/0.5ML ~~LOC~~ SOAJ: 1.5000 mg | SUBCUTANEOUS | 1 refills | Status: DC

## 2017-01-05 MED ORDER — PRAVASTATIN SODIUM 20 MG PO TABS: 20.0000 mg | ORAL_TABLET | Freq: Every day | ORAL | 1 refills | Status: DC

## 2017-01-05 MED ORDER — CARVEDILOL 6.25 MG PO TABS: 6.2500 mg | ORAL_TABLET | Freq: Two times a day (BID) | ORAL | 1 refills | Status: DC

## 2017-01-05 NOTE — Telephone Encounter (Signed)
All scripts changed to correct pharmacy. I will call the patient.

## 2017-01-05 NOTE — Telephone Encounter (Signed)
Pt needs to have all medications sent to the [facility] only.  He told us this yesterday. Please change asap

## 2017-01-05 NOTE — Telephone Encounter (Signed)
PT CALLING STATING THAT HE DIDN'T RECEIVED HIS INSULIN PEN NEEDLES AT THE [facility] IN [place]

## 2017-01-06 NOTE — Telephone Encounter (Signed)
PATIENT CALLED VERY UPSET THAT [facility] IN [place] CALLED TO TELL HIM HIS PRESCRIPTIONS WERE READY FOR PICK-UP. HE SAID HE HAS SPOKEN TO A NURSE AT LEAST 4 - 5 TIMES TO TELL us HE USES [facility] IN [place]. THEIR ADDRESS IS [address]. (THIS IS THE ONLY PHARMACY LISTED ON HIS PHARMACY LIST) I CALLED THEM TO SEE WHY HIS PRESCRIPTIONS WOULD BE SENT TO [facility] AT [address] IN [place] AND WAS TOLD THAT'S HOW IT WAS SENT ELECTRONICALLY? (PLEASE INVESTIGATE TO SEE IF IT MAY BE A GLITCH BECAUSE PATIENT WAS VERY UPSET) BEST PHONE IF QUESTIONS [phone] (CELL) MBC

## 2017-01-07 ENCOUNTER — Encounter: Payer: Self-pay | Admitting: Family Medicine

## 2017-01-07 MED ORDER — INSULIN PEN NEEDLE 32G X 4 MM MISC: 1.0000 | Freq: Three times a day (TID) | 3 refills | Status: DC

## 2017-01-07 NOTE — Addendum Note (Signed)
Addended by: Meredith Staggers R on: 01/07/2017 09:05 AM   Modules accepted: Orders

## 2017-01-07 NOTE — Telephone Encounter (Addendum)
I sent in insulin pen needles to the [facility] in [place].  As far as the other prescriptions, I would recommend he check with [facility] to see if the problem could have happened on their end. When I look at the prescriptions that I sent on August 22, it indicates that those were sent to the [facility] on [address] in [place]. We do have the correct pharmacy listed for him in [facility].  I will send him a patient email with this information.

## 2017-01-07 NOTE — Patient Instructions (Signed)
A1c ordered for lab only visit in September, then follow-up in December. No med changes for now.

## 2017-01-11 ENCOUNTER — Ambulatory Visit (INDEPENDENT_AMBULATORY_CARE_PROVIDER_SITE_OTHER): Payer: BC Managed Care – PPO | Admitting: Family Medicine

## 2017-01-11 ENCOUNTER — Encounter: Payer: Self-pay | Admitting: Family Medicine

## 2017-01-11 VITALS — BP 139/95 | HR 96 | Temp 98.1°F | Resp 16 | Ht >= 80 in | Wt 377.0 lb

## 2017-01-11 DIAGNOSIS — Z630 Problems in relationship with spouse or partner: Secondary | ICD-10-CM | POA: Diagnosis not present

## 2017-01-11 DIAGNOSIS — F329 Major depressive disorder, single episode, unspecified: Secondary | ICD-10-CM

## 2017-01-11 MED ORDER — ESCITALOPRAM OXALATE 10 MG PO TABS: 10.0000 mg | ORAL_TABLET | Freq: Every day | ORAL | 1 refills | Status: DC

## 2017-01-11 NOTE — Progress Notes (Signed)
Subjective:  This chart was scribed for Nathan Ray  MD by Tamsen Roers, at [place] at [facility].  This patient was seen in room 2 and the patient's care was started at 9:31 AM.   Chief Complaint  Patient presents with  . Anger Issues     Patient ID: Nathan Dean, male    DOB: 1956-02-16, 61 y.o.   MRN: [id number]  HPI HPI Comments: Nathan Dean is a 61 y.o. male who presents to Primary Care at [place] is here to discuss for some anger issues.  I last saw him 1 week ago.  His wife did express some concerns to me at that time about his mood and at times, irritability.  History of depression according to problem list. Previously had taken Lexapro but is currently on Elavil 100 mg at bed time. Note reviewed from Dr. [name] January 27 th regarding depression with unemployment.  He was treated with Lexapro 10 mg at that time. ---- Patient states that being out of work so many months- since April, and his wife "nagging persistently"  has been a very big stressor in his life.  He feels that he has always had a very short temper and feels that he "explodes" at times.  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 Patient thinks this was due to the nature of the job.  He is aware that his anger effects those around him.  Per patient, his wife has anxiety issues and states that when she doesn't get her way, she is easily "pissed off" and remembers things that were said many years ago.  He and his wife have not been able to do many of the things that they wanted to do (four years ago) and this is because he has changed as he has had neuropathy and a knee operation.  There have been no physical altercations with his wife when he gets angry and states that he just gets in his car with his dog and goes on a drive when it gets to a certain point. When he gets back home, his wife is still angry and this only escalates things further.  Patient states that he loves his wife  very much but he is at a very difficult place in his life.  He is unsure of what happened during his last visit which resulted in his wife crying.  He denies any out of the ordinary argument with her at that time and is unsure of what led her to crying. Patient denies any suicidal thoughts but does admit to feeling depressed.   He has been doing Environmental manager work for 8 years and states that he doesn't really even enjoy it.  His business was running well 30 years ago but this slowly started to change.  His wife has recommended that he works at [employer] but patient states that he would never be able to work there.     Patient Active Problem List   Diagnosis Date Noted  . Depression 06/03/2015  . HTN (hypertension) 01/02/2015  . Hyperlipidemia 01/02/2015  . 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 Sleep apnea 09/21/2010  . Arthritis, degenerative 02/17/2009  . Hereditary and idiopathic peripheral neuropathy 11/21/2008  . AD (atopic dermatitis) 07/08/2008  . Acid reflux 04/05/2007   Past Medical History:  Diagnosis Date  . Diabetes ([place])   . GERD (gastroesophageal reflux disease)   . Hyperlipidemia   .  Hypertension   . Sleep apnea    Past Surgical History:  Procedure Laterality Date  . CARPAL TUNNEL RELEASE Right 12/28/2016   Procedure: RIGHT ULNAR AND MEDIAN NEUROPLASTY AT WRIST;  Surgeon: Milly Jakob, MD;  Location: [facility];  Service: Orthopedics;  Laterality: Right;  . REPLACEMENT TOTAL KNEE BILATERAL  2014   left 2012 rt 2014   Allergies  Allergen Reactions  . Penicillins Anaphylaxis, Rash and Other (See Comments)  . Succinylcholine Chloride Anaphylaxis and Rash  . Keflex [Cephalexin] Rash  . Sulfa Antibiotics Rash and Other (See Comments)   Prior to Admission medications   Medication Sig Start Date End Date Taking? Authorizing Provider  acetaminophen (TYLENOL) 325 MG tablet Take 2 tablets (650 mg total) by  mouth every 6 (six) hours as needed for mild pain or moderate pain. 12/28/16   Milly Jakob, MD  albuterol (PROVENTIL HFA;VENTOLIN HFA) 108 (90 BASE) MCG/ACT inhaler Inhale 2 puffs into the lungs every 6 (six) hours as needed for wheezing or shortness of breath. 04/15/15   Arlis Porta., MD  amLODipine (NORVASC) 10 MG tablet Take 0.5 tablets (5 mg total) by mouth daily. 08/11/16   Karamalegos, Devonne Doughty, DO  aspirin 81 MG tablet Take 1 tablet (81 mg total) by mouth daily. 08/11/16   Karamalegos, Devonne Doughty, DO  Blood Glucose Monitoring Suppl (ONE TOUCH ULTRA SYSTEM KIT) w/Device KIT 1 kit by Does not apply route once. Dx. E11.9 07/28/15   Arlis Porta., MD  carvedilol (COREG) 6.25 MG tablet Take 1 tablet (6.25 mg total) by mouth 2 (two) times daily with a meal. 01/05/17   Wendie Agreste, MD  Cholecalciferol (VITAMIN D3) 5000 UNITS CAPS Take 10,000 Units by mouth daily.     [provider]  Dulaglutide (TRULICITY) 1.5 ZT/2.4PY SOPN Inject 1.5 mg into the skin once a week. 01/05/17   Wendie Agreste, MD  gabapentin (NEURONTIN) 300 MG capsule Take 1-2 capsules (300-600 mg total) by mouth 3 (three) times daily. 01/05/17 03/29/17  Wendie Agreste, MD  glucose blood test strip 1 each by Other route 4 (four) times daily. Use with One touch meter to check blood sugar. Dx E11.9 09/13/16   Mikey College, NP  hydrochlorothiazide (MICROZIDE) 12.5 MG capsule Take 1 capsule (12.5 mg total) by mouth daily. 09/01/16   Karamalegos, Devonne Doughty, DO  ibuprofen (ADVIL) 200 MG tablet Take 3 tablets (600 mg total) by mouth every 6 (six) hours as needed for moderate pain. 12/28/16   Milly Jakob, MD  Insulin Pen Needle (BD PEN NEEDLE NANO U/F) 32G X 4 MM MISC Inject 1 each as directed 3 (three) times daily. 01/07/17   Wendie Agreste, MD  LEVEMIR FLEXTOUCH 100 UNIT/ML Pen inject 54 units subcutaneously twice a day 01/05/17   Wendie Agreste, MD  losartan (COZAAR) 100 MG tablet Take 1  tablet (100 mg total) by mouth daily. 01/05/17   Wendie Agreste, MD  metFORMIN (GLUCOPHAGE) 500 MG tablet Take 2 tablets (1,000 mg total) by mouth 2 (two) times daily. 08/16/16   Karamalegos, Devonne Doughty, DO  omeprazole (PRILOSEC) 40 MG capsule Take 40 mg by mouth daily.    [provider]  ONE TOUCH LANCETS MISC 1 each by Does not apply route 4 (four) times daily. Use with one touch meter to check blood sugar. Dx: E11.9 09/16/16   Mikey College, NP  oxyCODONE (ROXICODONE) 5 MG immediate release tablet Take 1 tablet (5 mg  total) by mouth every 6 (six) hours as needed for breakthrough pain. 12/28/16   Milly Jakob, MD  pravastatin (PRAVACHOL) 20 MG tablet Take 1 tablet (20 mg total) by mouth daily. 01/05/17   Wendie Agreste, MD  traMADol (ULTRAM) 50 MG tablet Take 1 tablet (50 mg total) by mouth every 6 (six) hours as needed. 12/06/16   Wendie Agreste, MD  vitamin B-12 (CYANOCOBALAMIN) 1000 MCG tablet Take 1,000 mcg by mouth daily.    [provider]   Social History   Social History  . Marital status: Single    Spouse name: N/A  . Number of children: N/A  . Years of education: N/A   Occupational History  . Not on file.   Social History Main Topics  . Smoking status: Former Smoker    Packs/day: 2.00    Years: 20.00    Types: Cigarettes    Quit date: 06/28/1996  . Smokeless tobacco: Never Used  . Alcohol use 0.0 oz/week     Comment: occasional  . Drug use: No  . Sexual activity: Not on file   Other Topics Concern  . Not on file   Social History Narrative  . No narrative on file      Review of Systems  Constitutional: Negative for chills and fever.  Eyes: Negative for pain.  Respiratory: Negative for cough and choking.   Neurological: Negative for speech difficulty.  Psychiatric/Behavioral: Positive for dysphoric mood. Negative for suicidal ideas.       Objective:   Physical Exam  Constitutional: He appears well-developed and  well-nourished. No distress.  HENT:  Head: Normocephalic and atraumatic.  Neck: Neck supple.  Pulmonary/Chest: Effort normal.  Neurological: He is alert.  Skin: He is not diaphoretic.  Nursing note and vitals reviewed.  Vitals:   01/11/17 0851  BP: (!) 139/95  Pulse: 96  Resp: 16  Temp: 98.1 F (36.7 C)  TempSrc: Oral  SpO2: 93%  Weight: (!) 377 lb (171 kg)  Height: 6' 9"  (2.057 m)   Over 20 minutes face to face care, greater than 50% counseling.      Assessment & Plan:   Menachem Urbanek is a 61 y.o. male Reactive depression - Plan: escitalopram (LEXAPRO) 10 MG tablet  Marital conflict - Plan: escitalopram (LEXAPRO) 10 MG tablet  Suspect some component of adjustment disorder with previous irritability/anger issues, and reactive depression with health issues and difficulty with finding work. Also suspect some marital conflict that may be a part of his depression as well as worsened by his symptoms.  -Start Lexapro 10 mg daily, he has been prescribed this in the past, no known side effects, but potential initial side effects and risks discussed.  -Phone numbers provided for the for his wife, would recommend initial couples counseling then possibly individual counseling.  -Recheck 3-4 weeks  Meds ordered this encounter  Medications  . escitalopram (LEXAPRO) 10 MG tablet    Sig: Take 1 tablet (10 mg total) by mouth daily.    Dispense:  30 tablet    Refill:  1   Patient Instructions   See information on stress and stress management below, but I think it would be helpful to meet with a counselor. Initially both you and your spouse may benefit from counseling together. I have provided numbers for therapists in [place], but I will also reach out to Dr. Tamala Julian to see if she has others that may be closer to your home.  Vivia Budge: [phone] [place]:  [phone]  Additionally, start Lexapro once per day, and follow with me in 3-4 weeks to see how that is  going.   Stress and Stress Management Stress is a normal reaction to life events. It is what you feel when life demands more than you are used to or more than you can handle. Some stress can be useful. For example, the stress reaction can help you catch the last bus of the day, study for a test, or meet a deadline at work. But stress that occurs too often or for too long can cause problems. It can affect your emotional health and interfere with relationships and normal daily activities. Too much stress can weaken your immune system and increase your risk for physical illness. If you already have a medical problem, stress can make it worse. What are the causes? All sorts of life events may cause stress. An event that causes stress for one person may not be stressful for another person. Major life events commonly cause stress. These may be positive or negative. Examples include losing your job, moving into a new home, getting married, having a baby, or losing a loved one. Less obvious life events may also cause stress, especially if they occur day after day or in combination. Examples include working long hours, driving in traffic, caring for children, being in debt, or being in a difficult relationship. What are the signs or symptoms? Stress may cause emotional symptoms including, the following:  Anxiety. This is feeling worried, afraid, on edge, overwhelmed, or out of control.  Anger. This is feeling irritated or impatient.  Depression. This is feeling sad, down, helpless, or guilty.  Difficulty focusing, remembering, or making decisions.  Stress may cause physical symptoms, including the following:  Aches and pains. These may affect your head, neck, back, stomach, or other areas of your body.  Tight muscles or clenched jaw.  Low energy or trouble sleeping.  Stress may cause unhealthy behaviors, including the following:  Eating to feel better (overeating) or skipping meals.  Sleeping  too little, too much, or both.  Working too much or putting off tasks (procrastination).  Smoking, drinking alcohol, or using drugs to feel better.  How is this diagnosed? Stress is diagnosed through an assessment by your health care provider. Your health care provider will ask questions about your symptoms and any stressful life events.Your health care provider will also ask about your medical history and may order blood tests or other tests. Certain medical conditions and medicine can cause physical symptoms similar to stress. Mental illness can cause emotional symptoms and unhealthy behaviors similar to stress. Your health care provider may refer you to a mental health professional for further evaluation. How is this treated? Stress management is the recommended treatment for stress.The goals of stress management are reducing stressful life events and coping with stress in healthy ways. Techniques for reducing stressful life events include the following:  Stress identification. Self-monitor for stress and identify what causes stress for you. These skills may help you to avoid some stressful events.  Time management. Set your priorities, keep a calendar of events, and learn to say "no." These tools can help you avoid making too many commitments.  Techniques for coping with stress include the following:  Rethinking the problem. Try to think realistically about stressful events rather than ignoring them or overreacting. Try to find the positives in a stressful situation rather than focusing on the negatives.  Exercise. Physical exercise can release both physical and emotional  tension. The key is to find a form of exercise you enjoy and do it regularly.  Relaxation techniques. These relax the body and mind. Examples include yoga, meditation, tai chi, biofeedback, deep breathing, progressive muscle relaxation, listening to music, being out in nature, journaling, and other hobbies. Again, the  key is to find one or more that you enjoy and can do regularly.  Healthy lifestyle. Eat a balanced diet, get plenty of sleep, and do not smoke. Avoid using alcohol or drugs to relax.  Strong support network. Spend time with family, friends, or other people you enjoy being around.Express your feelings and talk things over with someone you trust.  Counseling or talktherapy with a mental health professional may be helpful if you are having difficulty managing stress on your own. Medicine is typically not recommended for the treatment of stress.Talk to your health care provider if you think you need medicine for symptoms of stress. Follow these instructions at home:  Keep all follow-up visits as directed by your health care provider.  Take all medicines as directed by your health care provider. Contact a health care provider if:  Your symptoms get worse or you start having new symptoms.  You feel overwhelmed by your problems and can no longer manage them on your own. Get help right away if:  You feel like hurting yourself or someone else. This information is not intended to replace advice given to you by your health care provider. Make sure you discuss any questions you have with your health care provider. Document Released: 10/27/2000 Document Revised: 10/09/2015 Document Reviewed: 12/26/2012 Elsevier Interactive Patient Education  2017 [employer].    IF you received an x-De[place] today, you will receive an invoice from [facility]. Please contact [facility] at [phone] with questions or concerns regarding your invoice.   IF you received labwork today, you will receive an invoice from [facility]. Please contact [name] at [phone] with questions or concerns regarding your invoice.   Our billing staff will not be able to assist you with questions regarding bills from these companies.  You will be contacted with the lab results as soon as they are available. The  fastest way to get your results is to activate your [name] account. Instructions are located on the last page of this paperwork. If you have not heard from [name] regarding the results in 2 weeks, please contact this office.       I personally performed the services described in this documentation, which was scribed in my presence. The recorded information has been reviewed and considered for accuracy and completeness, addended by me as needed, and agree with information above.  Signed,   Nathan Ray, MD Primary [facility] at [place].  01/12/17 3:16 PM

## 2017-01-11 NOTE — Patient Instructions (Addendum)
See information on stress and stress management below, but I think it would be helpful to meet with a counselor. Initially both you and your spouse may benefit from counseling together. I have provided numbers for therapists in [place], but I will also reach out to Dr. Tamala Julian to see if she has others that may be closer to your home.  Vivia Budge: [phone] [place]: [phone]  Additionally, start Lexapro once per day, and follow with me in 3-4 weeks to see how that is going.   Stress and Stress Management Stress is a normal reaction to life events. It is what you feel when life demands more than you are used to or more than you can handle. Some stress can be useful. For example, the stress reaction can help you catch the last bus of the day, study for a test, or meet a deadline at work. But stress that occurs too often or for too long can cause problems. It can affect your emotional health and interfere with relationships and normal daily activities. Too much stress can weaken your immune system and increase your risk for physical illness. If you already have a medical problem, stress can make it worse. What are the causes? All sorts of life events may cause stress. An event that causes stress for one person may not be stressful for another person. Major life events commonly cause stress. These may be positive or negative. Examples include losing your job, moving into a new home, getting married, having a baby, or losing a loved one. Less obvious life events may also cause stress, especially if they occur day after day or in combination. Examples include working long hours, driving in traffic, caring for children, being in debt, or being in a difficult relationship. What are the signs or symptoms? Stress may cause emotional symptoms including, the following:  Anxiety. This is feeling worried, afraid, on edge, overwhelmed, or out of control.  Anger. This is feeling irritated or  impatient.  Depression. This is feeling sad, down, helpless, or guilty.  Difficulty focusing, remembering, or making decisions.  Stress may cause physical symptoms, including the following:  Aches and pains. These may affect your head, neck, back, stomach, or other areas of your body.  Tight muscles or clenched jaw.  Low energy or trouble sleeping.  Stress may cause unhealthy behaviors, including the following:  Eating to feel better (overeating) or skipping meals.  Sleeping too little, too much, or both.  Working too much or putting off tasks (procrastination).  Smoking, drinking alcohol, or using drugs to feel better.  How is this diagnosed? Stress is diagnosed through an assessment by your health care provider. Your health care provider will ask questions about your symptoms and any stressful life events.Your health care provider will also ask about your medical history and may order blood tests or other tests. Certain medical conditions and medicine can cause physical symptoms similar to stress. Mental illness can cause emotional symptoms and unhealthy behaviors similar to stress. Your health care provider may refer you to a mental health professional for further evaluation. How is this treated? Stress management is the recommended treatment for stress.The goals of stress management are reducing stressful life events and coping with stress in healthy ways. Techniques for reducing stressful life events include the following:  Stress identification. Self-monitor for stress and identify what causes stress for you. These skills may help you to avoid some stressful events.  Time management. Set your priorities, keep a calendar of events,  and learn to say "no." These tools can help you avoid making too many commitments.  Techniques for coping with stress include the following:  Rethinking the problem. Try to think realistically about stressful events rather than ignoring them or  overreacting. Try to find the positives in a stressful situation rather than focusing on the negatives.  Exercise. Physical exercise can release both physical and emotional tension. The key is to find a form of exercise you enjoy and do it regularly.  Relaxation techniques. These relax the body and mind. Examples include yoga, meditation, tai chi, biofeedback, deep breathing, progressive muscle relaxation, listening to music, being out in nature, journaling, and other hobbies. Again, the key is to find one or more that you enjoy and can do regularly.  Healthy lifestyle. Eat a balanced diet, get plenty of sleep, and do not smoke. Avoid using alcohol or drugs to relax.  Strong support network. Spend time with family, friends, or other people you enjoy being around.Express your feelings and talk things over with someone you trust.  Counseling or talktherapy with a mental health professional may be helpful if you are having difficulty managing stress on your own. Medicine is typically not recommended for the treatment of stress.Talk to your health care provider if you think you need medicine for symptoms of stress. Follow these instructions at home:  Keep all follow-up visits as directed by your health care provider.  Take all medicines as directed by your health care provider. Contact a health care provider if:  Your symptoms get worse or you start having new symptoms.  You feel overwhelmed by your problems and can no longer manage them on your own. Get help right away if:  You feel like hurting yourself or someone else. This information is not intended to replace advice given to you by your health care provider. Make sure you discuss any questions you have with your health care provider. Document Released: 10/27/2000 Document Revised: 10/09/2015 Document Reviewed: 12/26/2012 Elsevier Interactive Patient Education  2017 [employer].    IF you received an x-ray today, you will receive  an invoice from [facility]. Please contact [facility] at [phone] with questions or concerns regarding your invoice.   IF you received labwork today, you will receive an invoice from [facility]. Please contact [name] at [phone] with questions or concerns regarding your invoice.   Our billing staff will not be able to assist you with questions regarding bills from these companies.  You will be contacted with the lab results as soon as they are available. The fastest way to get your results is to activate your [name] account. Instructions are located on the last page of this paperwork. If you have not heard from [name] regarding the results in 2 weeks, please contact this office.

## 2017-02-04 ENCOUNTER — Ambulatory Visit (INDEPENDENT_AMBULATORY_CARE_PROVIDER_SITE_OTHER): Payer: BC Managed Care – PPO | Admitting: Family Medicine

## 2017-02-04 ENCOUNTER — Encounter: Payer: Self-pay | Admitting: Family Medicine

## 2017-02-04 VITALS — BP 153/84 | HR 104 | Temp 98.0°F | Resp 17 | Ht >= 80 in | Wt 377.0 lb

## 2017-02-04 DIAGNOSIS — G629 Polyneuropathy, unspecified: Secondary | ICD-10-CM

## 2017-02-04 DIAGNOSIS — I10 Essential (primary) hypertension: Secondary | ICD-10-CM

## 2017-02-04 DIAGNOSIS — F329 Major depressive disorder, single episode, unspecified: Secondary | ICD-10-CM | POA: Diagnosis not present

## 2017-02-04 DIAGNOSIS — M5432 Sciatica, left side: Secondary | ICD-10-CM

## 2017-02-04 MED ORDER — HYDROCHLOROTHIAZIDE 25 MG PO TABS: 25.0000 mg | ORAL_TABLET | Freq: Every day | ORAL | 1 refills | Status: DC

## 2017-02-04 NOTE — Progress Notes (Signed)
Today Arlys John was triaging pt. Arlys John came to the TL desk and asked Dr.Greene if it was ok to use the tight b/p cuff on the pt due to pt request.  Dr. Neva Seat states yes, if it is in the range to do so.  I went in the triage room with Arlys John. I placed the thigh cuff on the pt arm and it was out of range(to big). Arlys John states that we need to use the large b/p cuff per Dr. Neva Seat statement. Pt states" I heard what the doctor said and I don't care what he said. You are not going to use that other cuff on me!"  So, I used the thigh cuff and let Dr. Neva Seat know that the cuff was to big but the pt would not let us use the right size.   Dr. Chilton Si states that he heard the pt and for me to document it .

## 2017-02-04 NOTE — Progress Notes (Signed)
Subjective:  By signing my name below, I, Nathan Dean, attest that this documentation has been prepared under the direction and in the presence of Nathan Ray, MD. Electronically Signed: [name], [place]. 02/04/2017 , 11:12 AM .  Patient was seen in Room 11 .   Patient ID: Nathan Dean, male    DOB: 06-03-1955, 61 y.o.   MRN: [id number] Chief Complaint  Patient presents with  . disability papers   HPI Nathan Dean is a 61 y.o. male  Patient was most recently seen on Aug 28th. 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 He was also recommended counseling for marital conflict as well as some of his depressive symptoms. Advised for follow up in 3-4 weeks.   Patient states his mood has been pretty good. He saw a cSocial worker Nathan Dean Patient met with the counselor first alone, and then his wife seen alone. Then third visit together with his wife. Patient notes that his mood will improve more when he is able to walk again.   He has disability services paperwork from [employer] due to his neuropathy and his back. He rates his health being at a 5, on a scale of 1 to 10.   Peripheral neuropathy In his legs and hands. His main limitation is overall length of time walking with his legs. He isn't able to walk around a block, due to numbness in his feet. He's taken 2 pills of gabapentin TID, but can't really tell if it improves. He denies side effects.   He had recent surgery for carpal tunnel, which is healing but still hurts.   Left sciatic nerve He states this has been debilitating him. He hasn't seen ortho recently. He's seen ortho in the past, but retired. His nerve flared up recently, started about 6 weeks ago. He's been taking ibuprofen or tylenol. He hasn't applied heat or ice treatment. He denies bowel or bladder incontinence, saddle anesthesia, or weakness. He denies any falls or injuries. In the past, he's taken oxycodone for pain.    HTN He takes Norvasc  581mQD, Coreg 6.2570mID, HCTZ 12.5mg57mnd Losartan. He doesn't check his BP at home.   BP Readings from Last 3 Encounters:  02/04/17 (!) 153/84  01/11/17 (!) 139/95  01/04/17 (!) 142/85     Patient Active Problem List   Diagnosis Date Noted  . Depression 06/03/2015  . HTN (hypertension) 01/02/2015  . Hyperlipidemia 01/02/2015  . 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 Sleep apnea 09/21/2010  . Arthritis, degenerative 02/17/2009  . Hereditary and idiopathic peripheral neuropathy 11/21/2008  . AD (atopic dermatitis) 07/08/2008  . Acid reflux 04/05/2007   Past Medical History:  Diagnosis Date  . Diabetes (HCC)[place]. GERD (gastroesophageal reflux disease)   . Hyperlipidemia   . Hypertension   . Sleep apnea    Past Surgical History:  Procedure Laterality Date  . CARPAL TUNNEL RELEASE Right 12/28/2016   Procedure: RIGHT ULNAR AND MEDIAN NEUROPLASTY AT WRIST;  Surgeon: ThomMilly Jakob;  [facility];  Laterality: Right;  . REPLACEMENT TOTAL KNEE BILATERAL  2014   left 2012 rt 2014   Allergies  Allergen Reactions  . Penicillins Anaphylaxis, Rash and Other (See Comments)  . Succinylcholine Chloride Anaphylaxis and Rash  . Keflex [Cephalexin] Rash  . Sulfa Antibiotics Rash and Other (See Comments)   Prior to Admission medications   Medication Sig Start  Date End Date Taking? Authorizing Provider  acetaminophen (TYLENOL) 325 MG tablet Take 2 tablets (650 mg total) by mouth every 6 (six) hours as needed for mild pain or moderate pain. 12/28/16   Milly Jakob, MD  albuterol (PROVENTIL HFA;VENTOLIN HFA) 108 (90 BASE) MCG/ACT inhaler Inhale 2 puffs into the lungs every 6 (six) hours as needed for wheezing or shortness of breath. 04/15/15   Arlis Porta., MD  amLODipine (NORVASC) 10 MG tablet Take 0.5 tablets (5 mg total) by mouth daily. 08/11/16   Karamalegos, Devonne Doughty, DO  aspirin  81 MG tablet Take 1 tablet (81 mg total) by mouth daily. 08/11/16   Karamalegos, Devonne Doughty, DO  Dean Glucose Monitoring Suppl (ONE TOUCH ULTRA SYSTEM KIT) w/Device KIT 1 kit by Does not apply route once. Dx. E11.9 07/28/15   Arlis Porta., MD  carvedilol (COREG) 6.25 MG tablet Take 1 tablet (6.25 mg total) by mouth 2 (two) times daily with a meal. 01/05/17   Wendie Agreste, MD  Cholecalciferol (VITAMIN D3) 5000 UNITS CAPS Take 10,000 Units by mouth daily.     [provider]  Dulaglutide (TRULICITY) 1.5 JW/1.1BJ SOPN Inject 1.5 mg into the skin once a week. 01/05/17   Wendie Agreste, MD  escitalopram (LEXAPRO) 10 MG tablet Take 1 tablet (10 mg total) by mouth daily. 01/11/17   Wendie Agreste, MD  gabapentin (NEURONTIN) 300 MG capsule Take 1-2 capsules (300-600 mg total) by mouth 3 (three) times daily. 01/05/17 03/29/17  Wendie Agreste, MD  glucose Dean test strip 1 each by Other route 4 (four) times daily. Use with One touch meter to check Dean sugar. Dx E11.9 09/13/16   Mikey College, NP  hydrochlorothiazide (MICROZIDE) 12.5 MG capsule Take 1 capsule (12.5 mg total) by mouth daily. 09/01/16   Karamalegos, Devonne Doughty, DO  ibuprofen (ADVIL) 200 MG tablet Take 3 tablets (600 mg total) by mouth every 6 (six) hours as needed for moderate pain. 12/28/16   Milly Jakob, MD  Insulin Pen Needle (BD PEN NEEDLE NANO U/F) 32G X 4 MM MISC Inject 1 each as directed 3 (three) times daily. 01/07/17   Wendie Agreste, MD  LEVEMIR FLEXTOUCH 100 UNIT/ML Pen inject 54 units subcutaneously twice a day 01/05/17   Wendie Agreste, MD  losartan (COZAAR) 100 MG tablet Take 1 tablet (100 mg total) by mouth daily. 01/05/17   Wendie Agreste, MD  metFORMIN (GLUCOPHAGE) 500 MG tablet Take 2 tablets (1,000 mg total) by mouth 2 (two) times daily. 08/16/16   Karamalegos, Devonne Doughty, DO  omeprazole (PRILOSEC) 40 MG capsule Take 40 mg by mouth daily.    [provider]  ONE TOUCH LANCETS  MISC 1 each by Does not apply route 4 (four) times daily. Use with one touch meter to check Dean sugar. Dx: E11.9 09/16/16   Mikey College, NP  oxyCODONE (ROXICODONE) 5 MG immediate release tablet Take 1 tablet (5 mg total) by mouth every 6 (six) hours as needed for breakthrough pain. 12/28/16   Milly Jakob, MD  pravastatin (PRAVACHOL) 20 MG tablet Take 1 tablet (20 mg total) by mouth daily. 01/05/17   Wendie Agreste, MD  traMADol (ULTRAM) 50 MG tablet Take 1 tablet (50 mg total) by mouth every 6 (six) hours as needed. 12/06/16   Wendie Agreste, MD  vitamin B-12 (CYANOCOBALAMIN) 1000 MCG tablet Take 1,000 mcg by mouth daily.    [provider]   Social  History   Social History  . Marital status: Single    Spouse name: N/A  . Number of children: N/A  . Years of education: N/A   Occupational History  . Not on file.   Social History Main Topics  . Smoking status: Former Smoker    Packs/day: 2.00    Years: 20.00    Types: Cigarettes    Quit date: 06/28/1996  . Smokeless tobacco: Never Used  . Alcohol use 0.0 oz/week     Comment: occasional  . Drug use: No  . Sexual activity: Not on file   Other Topics Concern  . Not on file   Social History Narrative  . No narrative on file   Review of Systems  Constitutional: Negative for fatigue and unexpected weight change.  Eyes: Negative for visual disturbance.  Respiratory: Negative for cough, chest tightness and shortness of breath.   Cardiovascular: Negative for chest pain, palpitations and leg swelling.  Gastrointestinal: Negative for abdominal pain and Dean in stool.  Musculoskeletal: Positive for arthralgias, back pain and myalgias.  Neurological: Positive for numbness. Negative for dizziness, light-headedness and headaches.       Objective:   Physical Exam  Constitutional: He is oriented to person, place, and time. He appears well-developed and well-nourished.  HENT:  Head: Normocephalic and atraumatic.   Eyes: Pupils are equal, round, and reactive to light. EOM are normal.  Neck: No JVD present. Carotid bruit is not present.  Cardiovascular: Normal rate, regular rhythm and normal heart sounds.   No murmur heard. Pulmonary/Chest: Effort normal and breath sounds normal. He has no rales.  Musculoskeletal: He exhibits no edema.  Gait intact, slight discomfort of left lower paraspinal into the left lower back; left sciatic notch non tender; negative seated straight leg raise  Neurological: He is alert and oriented to person, place, and time.  Difficulty obtaining patellar and achilles DTR's, but equal bilaterally  Skin: Skin is warm and dry.  Psychiatric: He has a normal mood and affect.  Vitals reviewed.   Vitals:   02/04/17 1019  BP: (!) 153/84  Pulse: (!) 104  Resp: 17  Temp: 98 F (36.7 C)  TempSrc: Oral  SpO2: 98%  Weight: (!) 377 lb (171 kg)  Height: 6' 9"  (2.057 m)   [11:11AM] BP recheck in room, sitting, left arm, large cuff(not thigh, verified fit) manual: 140/88    Assessment & Plan:    Fuller Makin is a 61 y.o. male Reactive depression  -Improving. Continue counseling, tolerating current dose of Lexapro. Continue same dose for now  Essential hypertension - Plan: hydrochlorothiazide (HYDRODIURIL) 25 MG tablet  -Decreased control, increase HCTZ to 25 mg daily.  -Importance of correct Dean pressure cuff and fit discussed as if we used too large of a cough, it may give him a falsely low number. Understanding expressed.  Left sided sciatica Peripheral polyneuropathy  -Slight sciatica symptoms, history of peripheral neuropathy that has been ability to exercise/walking distances. No red flags on exam with sciatica symptoms at present.  -Can try further increases in gabapentin, beginning at nighttime, then slowly adding in other doses as tolerated. Potential side effects discussed.  -Tylenol as needed, avoid NSAIDs if possible. RTC precautions regarding sciatica  symptoms.  -He left disability paperwork, I will take a look at that to see if separate visit needed to review questions, or may need to be completed in part from his orthopaedic specialist.  Meds ordered this encounter  Medications  . hydrochlorothiazide (HYDRODIURIL) 25 MG  tablet    Sig: Take 1 tablet (25 mg total) by mouth daily.    Dispense:  90 tablet    Refill:  1   Patient Instructions   For neuropathy - can try increasing gabapentin to 3 pills at night, then increase other doses every few days if not improving. Follow up in next few weeks to see how that is going.   For sciatica, gabapentin dosing may help, but see other info below.  Ok to continue tylenol, try to minimize ibuprofen as that can be harmful to Dean pressure. Let me know if stronger med needed, but follow up in 2 weeks to recheck. Sooner if worse.   Dean pressure still is running too high as goal Dean pressure less than 130/80 with diabetes. Increase HIDA chlorothiazide 25 mg daily, continue same dose of other medicines, follow up in 2 weeks. Sooner if worse.   Sciatica Sciatica is pain, numbness, weakness, or tingling along the path of the sciatic nerve. The sciatic nerve starts in the lower back and runs down the back of each leg. The nerve controls the muscles in the lower leg and in the back of the knee. It also provides feeling (sensation) to the back of the thigh, the lower leg, and the sole of the foot. Sciatica is a symptom of another medical condition that pinches or puts pressure on the sciatic nerve. Generally, sciatica only affects one side of the body. Sciatica usually goes away on its own or with treatment. In some cases, sciatica may keep coming back (recur). What are the causes? This condition is caused by pressure on the sciatic nerve, or pinching of the sciatic nerve. This may be the result of:  A disk in between the bones of the spine (vertebrae) bulging out too far (herniated disk).  Age-related  changes in the spinal disks (degenerative disk disease).  A pain disorder that affects a muscle in the buttock (piriformis syndrome).  Extra bone growth (bone spur) near the sciatic nerve.  An injury or break (fracture) of the pelvis.  Pregnancy.  Tumor (rare).  What increases the risk? The following factors may make you more likely to develop this condition:  Playing sports that place pressure or stress on the spine, such as football or weight lifting.  Having poor strength and flexibility.  A history of back injury.  A history of back surgery.  Sitting for long periods of time.  Doing activities that involve repetitive bending or lifting.  Obesity.  What are the signs or symptoms? Symptoms can vary from mild to very severe, and they may include:  Any of these problems in the lower back, leg, hip, or buttock: ? Mild tingling or dull aches. ? Burning sensations. ? Sharp pains.  Numbness in the back of the calf or the sole of the foot.  Leg weakness.  Severe back pain that makes movement difficult.  These symptoms may get worse when you cough, sneeze, or laugh, or when you sit or stand for long periods of time. Being overweight may also make symptoms worse. In some cases, symptoms may recur over time. How is this diagnosed? This condition may be diagnosed based on:  Your symptoms.  A physical exam. Your health care provider may ask you to do certain movements to check whether those movements trigger your symptoms.  You may have tests, including: ? Dean tests. ? X-rays. ? MRI. ? CT scan.  How is this treated? In many cases, this condition improves on  its own, without any treatment. However, treatment may include:  Reducing or modifying physical activity during periods of pain.  Exercising and stretching to strengthen your abdomen and improve the flexibility of your spine.  Icing and applying heat to the affected area.  Medicines that help: ? To  relieve pain and swelling. ? To relax your muscles.  Injections of medicines that help to relieve pain, irritation, and inflammation around the sciatic nerve (steroids).  Surgery.  Follow these instructions at home: Medicines  Take over-the-counter and prescription medicines only as told by your health care provider.  Do not drive or operate heavy machinery while taking prescription pain medicine. Managing pain  If directed, apply ice to the affected area. ? Put ice in a plastic bag. ? Place a towel between your skin and the bag. ? Leave the ice on for 20 minutes, 2-3 times a day.  After icing, apply heat to the affected area before you exercise or as often as told by your health care provider. Use the heat source that your health care provider recommends, such as a moist heat pack or a heating pad. ? Place a towel between your skin and the heat source. ? Leave the heat on for 20-30 minutes. ? Remove the heat if your skin turns bright red. This is especially important if you are unable to feel pain, heat, or cold. You may have a greater risk of getting burned. Activity  Return to your normal activities as told by your health care provider. Ask your health care provider what activities are safe for you. ? Avoid activities that make your symptoms worse.  Take brief periods of rest throughout the day. Resting in a lying or standing position is usually better than sitting to rest. ? When you rest for longer periods, mix in some mild activity or stretching between periods of rest. This will help to prevent stiffness and pain. ? Avoid sitting for long periods of time without moving. Get up and move around at least one time each hour.  Exercise and stretch regularly, as told by your health care provider.  Do not lift anything that is heavier than 10 lb (4.5 kg) while you have symptoms of sciatica. When you do not have symptoms, you should still avoid heavy lifting, especially repetitive  heavy lifting.  When you lift objects, always use proper lifting technique, which includes: ? Bending your knees. ? Keeping the load close to your body. ? Avoiding twisting. General instructions  Use good posture. ? Avoid leaning forward while sitting. ? Avoid hunching over while standing.  Maintain a healthy weight. Excess weight puts extra stress on your back and makes it difficult to maintain good posture.  Wear supportive, comfortable shoes. Avoid wearing high heels.  Avoid sleeping on a mattress that is too soft or too hard. A mattress that is firm enough to support your back when you sleep may help to reduce your pain.  Keep all follow-up visits as told by your health care provider. This is important. Contact a health care provider if:  You have pain that wakes you up when you are sleeping.  You have pain that gets worse when you lie down.  Your pain is worse than you have experienced in the past.  Your pain lasts longer than 4 weeks.  You experience unexplained weight loss. Get help right away if:  You lose control of your bowel or bladder (incontinence).  You have: ? Weakness in your lower back, pelvis, buttocks,  or legs that gets worse. ? Redness or swelling of your back. ? A burning sensation when you urinate. This information is not intended to replace advice given to you by your health care provider. Make sure you discuss any questions you have with your health care provider. Document Released: 04/27/2001 Document Revised: 10/07/2015 Document Reviewed: 01/10/2015 Elsevier Interactive Patient Education  2017 [employer].    IF you received an x-Dean today, you will receive an invoice from [facility]. Please contact [facility] at [phone] with questions or concerns regarding your invoice.   IF you received labwork today, you will receive an invoice from [employer]. Please contact [name] at [phone] with questions or concerns  regarding your invoice.   Our billing staff will not be able to assist you with questions regarding bills from these companies.  You will be contacted with the lab results as soon as they are available. The fastest way to get your results is to activate your [name] account. Instructions are located on the last page of this paperwork. If you have not heard from [name] regarding the results in 2 weeks, please contact this office.       I personally performed the services described in this documentation, which was scribed in my presence. The recorded information has been reviewed and considered for accuracy and completeness, addended by me as needed, and agree with information above.  Signed,   Nathan Ray, MD [facility] [place].  02/06/17 12:48 PM

## 2017-02-04 NOTE — Patient Instructions (Addendum)
For neuropathy - can try increasing gabapentin to 3 pills at night, then increase other doses every few days if not improving. Follow up in next few weeks to see how that is going.   For sciatica, gabapentin dosing may help, but see other info below.  Ok to continue tylenol, try to minimize ibuprofen as that can be harmful to blood pressure. Let me know if stronger med needed, but follow up in 2 weeks to recheck. Sooner if worse.   Blood pressure still is running too high as goal blood pressure less than 130/80 with diabetes. Increase HIDA chlorothiazide 25 mg daily, continue same dose of other medicines, follow up in 2 weeks. Sooner if worse.   Sciatica Sciatica is pain, numbness, weakness, or tingling along the path of the sciatic nerve. The sciatic nerve starts in the lower back and runs down the back of each leg. The nerve controls the muscles in the lower leg and in the back of the knee. It also provides feeling (sensation) to the back of the thigh, the lower leg, and the sole of the foot. Sciatica is a symptom of another medical condition that pinches or puts pressure on the sciatic nerve. Generally, sciatica only affects one side of the body. Sciatica usually goes away on its own or with treatment. In some cases, sciatica may keep coming back (recur). What are the causes? This condition is caused by pressure on the sciatic nerve, or pinching of the sciatic nerve. This may be the result of:  A disk in between the bones of the spine (vertebrae) bulging out too far (herniated disk).  Age-related changes in the spinal disks (degenerative disk disease).  A pain disorder that affects a muscle in the buttock (piriformis syndrome).  Extra bone growth (bone spur) near the sciatic nerve.  An injury or break (fracture) of the pelvis.  Pregnancy.  Tumor (rare).  What increases the risk? The following factors may make you more likely to develop this condition:  Playing sports that place  pressure or stress on the spine, such as football or weight lifting.  Having poor strength and flexibility.  A history of back injury.  A history of back surgery.  Sitting for long periods of time.  Doing activities that involve repetitive bending or lifting.  Obesity.  What are the signs or symptoms? Symptoms can vary from mild to very severe, and they may include:  Any of these problems in the lower back, leg, hip, or buttock: ? Mild tingling or dull aches. ? Burning sensations. ? Sharp pains.  Numbness in the back of the calf or the sole of the foot.  Leg weakness.  Severe back pain that makes movement difficult.  These symptoms may get worse when you cough, sneeze, or laugh, or when you sit or stand for long periods of time. Being overweight may also make symptoms worse. In some cases, symptoms may recur over time. How is this diagnosed? This condition may be diagnosed based on:  Your symptoms.  A physical exam. Your health care provider may ask you to do certain movements to check whether those movements trigger your symptoms.  You may have tests, including: ? Blood tests. ? X-rays. ? MRI. ? CT scan.  How is this treated? In many cases, this condition improves on its own, without any treatment. However, treatment may include:  Reducing or modifying physical activity during periods of pain.  Exercising and stretching to strengthen your abdomen and improve the flexibility of your  spine.  Icing and applying heat to the affected area.  Medicines that help: ? To relieve pain and swelling. ? To relax your muscles.  Injections of medicines that help to relieve pain, irritation, and inflammation around the sciatic nerve (steroids).  Surgery.  Follow these instructions at home: Medicines  Take over-the-counter and prescription medicines only as told by your health care provider.  Do not drive or operate heavy machinery while taking prescription pain  medicine. Managing pain  If directed, apply ice to the affected area. ? Put ice in a plastic bag. ? Place a towel between your skin and the bag. ? Leave the ice on for 20 minutes, 2-3 times a day.  After icing, apply heat to the affected area before you exercise or as often as told by your health care provider. Use the heat source that your health care provider recommends, such as a moist heat pack or a heating pad. ? Place a towel between your skin and the heat source. ? Leave the heat on for 20-30 minutes. ? Remove the heat if your skin turns bright red. This is especially important if you are unable to feel pain, heat, or cold. You may have a greater risk of getting burned. Activity  Return to your normal activities as told by your health care provider. Ask your health care provider what activities are safe for you. ? Avoid activities that make your symptoms worse.  Take brief periods of rest throughout the day. Resting in a lying or standing position is usually better than sitting to rest. ? When you rest for longer periods, mix in some mild activity or stretching between periods of rest. This will help to prevent stiffness and pain. ? Avoid sitting for long periods of time without moving. Get up and move around at least one time each hour.  Exercise and stretch regularly, as told by your health care provider.  Do not lift anything that is heavier than 10 lb (4.5 kg) while you have symptoms of sciatica. When you do not have symptoms, you should still avoid heavy lifting, especially repetitive heavy lifting.  When you lift objects, always use proper lifting technique, which includes: ? Bending your knees. ? Keeping the load close to your body. ? Avoiding twisting. General instructions  Use good posture. ? Avoid leaning forward while sitting. ? Avoid hunching over while standing.  Maintain a healthy weight. Excess weight puts extra stress on your back and makes it difficult to  maintain good posture.  Wear supportive, comfortable shoes. Avoid wearing high heels.  Avoid sleeping on a mattress that is too soft or too hard. A mattress that is firm enough to support your back when you sleep may help to reduce your pain.  Keep all follow-up visits as told by your health care provider. This is important. Contact a health care provider if:  You have pain that wakes you up when you are sleeping.  You have pain that gets worse when you lie down.  Your pain is worse than you have experienced in the past.  Your pain lasts longer than 4 weeks.  You experience unexplained weight loss. Get help right away if:  You lose control of your bowel or bladder (incontinence).  You have: ? Weakness in your lower back, pelvis, buttocks, or legs that gets worse. ? Redness or swelling of your back. ? A burning sensation when you urinate. This information is not intended to replace advice given to you by your health  care provider. Make sure you discuss any questions you have with your health care provider. Document Released: 04/27/2001 Document Revised: 10/07/2015 Document Reviewed: 01/10/2015 Elsevier Interactive Patient Education  2017 [employer].    IF you received an x-ray today, you will receive an invoice from [facility]. Please contact [facility] at [phone] with questions or concerns regarding your invoice.   IF you received labwork today, you will receive an invoice from [facility]. Please contact [name] at [phone] with questions or concerns regarding your invoice.   Our billing staff will not be able to assist you with questions regarding bills from these companies.  You will be contacted with the lab results as soon as they are available. The fastest way to get your results is to activate your [name] account. Instructions are located on the last page of this paperwork. If you have not heard from [name] regarding the results in 2 weeks,  please contact this office.

## 2017-02-18 ENCOUNTER — Ambulatory Visit (INDEPENDENT_AMBULATORY_CARE_PROVIDER_SITE_OTHER): Payer: BC Managed Care – PPO | Admitting: Family Medicine

## 2017-02-18 ENCOUNTER — Telehealth: Payer: Self-pay | Admitting: Family Medicine

## 2017-02-18 ENCOUNTER — Encounter: Payer: Self-pay | Admitting: Family Medicine

## 2017-02-18 VITALS — BP 118/70 | HR 116 | Temp 98.6°F | Resp 16 | Ht >= 80 in | Wt 376.0 lb

## 2017-02-18 DIAGNOSIS — Z630 Problems in relationship with spouse or partner: Secondary | ICD-10-CM | POA: Diagnosis not present

## 2017-02-18 DIAGNOSIS — G4733 Obstructive sleep apnea (adult) (pediatric): Secondary | ICD-10-CM

## 2017-02-18 DIAGNOSIS — I10 Essential (primary) hypertension: Secondary | ICD-10-CM

## 2017-02-18 DIAGNOSIS — F329 Major depressive disorder, single episode, unspecified: Secondary | ICD-10-CM | POA: Diagnosis not present

## 2017-02-18 DIAGNOSIS — Z9889 Other specified postprocedural states: Secondary | ICD-10-CM | POA: Diagnosis not present

## 2017-02-18 DIAGNOSIS — E1142 Type 2 diabetes mellitus with diabetic polyneuropathy: Secondary | ICD-10-CM

## 2017-02-18 MED ORDER — ESCITALOPRAM OXALATE 10 MG PO TABS: 10.0000 mg | ORAL_TABLET | Freq: Every day | ORAL | 1 refills | Status: DC

## 2017-02-18 NOTE — Telephone Encounter (Signed)
FYI

## 2017-02-18 NOTE — Progress Notes (Addendum)
Subjective:  By signing my name below, I, Nathan Dean, attest that this documentation has been prepared under the direction and in the presence of Wendie Agreste, MD Electronically Signed: Ladene Artist, ED Scribe 02/18/2017 at 10:39 AM.   Patient ID: Nathan Dean, male    DOB: 24-Aug-1955, 61 y.o.   MRN: [id number]  Chief Complaint  Patient presents with  . Follow-up   HPI  Nathan Dean is a 61 y.o. male who presents to Primary Care for follow-up.   Reactive Depression  Pt was here 9/24 following up on reactive depression. Started on Lexapro 8/28. Mood was improving and had seen counselor Vivia Budge. States Lexapro has been going well. States things are going well with him and his wife.   HTN Pt had decreased control last visit. HCTZ was changed to 25 mg qd. He continued same dose of Norvasc and Coreg. Pt does not have a BP machine at home to check his BP outside of the office. States his HR is typically always elevated. Denies light-headedness, dizziness.   Sciatica with Neuropathy intermittent sciatica symptoms and peripheral neuropathy that has impacted him walking long distances and exercise. Taking Gabapentin. Discussed increasing doses last visit. Tylenol PRN. Recent surgery for carpal tunnel syndrome but has not seen other orthopedist recently. Pt reports gradually improving hand pain s/p surgery which he is no longer treating with Tylenol. Previous ortho treating sciatica had retired. Also been evaluated by neurologist Dr. Melrose Nakayama with [facility]. Pt did provide disability questionnaire last visit that I said I would look into. Diagnosed with neuropathy 17 years ago, however, pt suspects that neuropathy has worsened after knee surgeries which were 4 years ago on the L and 2 years ago on the R.   Numbness in Feet with a [place] in November 2017. Thought to be in part due to diabetic neuropathy. 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 Referred to PT for balance training. Recommended Gabapentin 300 mg bid which he started at last visit. Also recommended marching in place 15 mins/day and balance exercises. Denies recent falls, burning pain in feet. Pt states he had 1 PT session for balance but has been doing balance training at home daily. He is no longer ambulating with a cane although he reports that he has had to in the past. Pt states he stopped amitriptyline but is now taking another unknown medication for his nerves.  Patient Active Problem List   Diagnosis Date Noted  . Depression 06/03/2015  . HTN (hypertension) 01/02/2015  . Hyperlipidemia 01/02/2015  . 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 Sleep apnea 09/21/2010  . Arthritis, degenerative 02/17/2009  . Hereditary and idiopathic peripheral neuropathy 11/21/2008  . AD (atopic dermatitis) 07/08/2008  . Acid reflux 04/05/2007   Past Medical History:  Diagnosis Date  . Diabetes ([place])   . GERD (gastroesophageal reflux disease)   . Hyperlipidemia   . Hypertension   . Sleep apnea    Past Surgical History:  Procedure Laterality Date  . CARPAL TUNNEL RELEASE Right 12/28/2016   Procedure: RIGHT ULNAR AND MEDIAN NEUROPLASTY AT WRIST;  Surgeon: Milly Jakob, MD;  Location: [facility];  Service: Orthopedics;  Laterality: Right;  . REPLACEMENT TOTAL KNEE BILATERAL  2014   left 2012 rt 2014   Allergies  Allergen Reactions  . Penicillins Anaphylaxis, Rash and Other (See Comments)  . Succinylcholine Chloride Anaphylaxis  and Rash  . Keflex [Cephalexin] Rash  . Sulfa Antibiotics Rash and Other (See Comments)   Prior to Admission medications   Medication Sig Start Date End Date Taking? Authorizing Provider  acetaminophen (TYLENOL) 325 MG tablet Take 2 tablets (650 mg total) by mouth every 6 (six) hours as needed for mild pain or moderate pain. 12/28/16   Milly Jakob, MD  albuterol  (PROVENTIL HFA;VENTOLIN HFA) 108 (90 BASE) MCG/ACT inhaler Inhale 2 puffs into the lungs every 6 (six) hours as needed for wheezing or shortness of breath. 04/15/15   Arlis Porta., MD  amLODipine (NORVASC) 10 MG tablet Take 0.5 tablets (5 mg total) by mouth daily. 08/11/16   Karamalegos, Devonne Doughty, DO  aspirin 81 MG tablet Take 1 tablet (81 mg total) by mouth daily. 08/11/16   Karamalegos, Devonne Doughty, DO  Blood Glucose Monitoring Suppl (ONE TOUCH ULTRA SYSTEM KIT) w/Device KIT 1 kit by Does not apply route once. Dx. E11.9 07/28/15   Arlis Porta., MD  carvedilol (COREG) 6.25 MG tablet Take 1 tablet (6.25 mg total) by mouth 2 (two) times daily with a meal. 01/05/17   Wendie Agreste, MD  Cholecalciferol (VITAMIN D3) 5000 UNITS CAPS Take 10,000 Units by mouth daily.     [provider]  Dulaglutide (TRULICITY) 1.5 OZ/3.0QM SOPN Inject 1.5 mg into the skin once a week. 01/05/17   Wendie Agreste, MD  escitalopram (LEXAPRO) 10 MG tablet Take 1 tablet (10 mg total) by mouth daily. 01/11/17   Wendie Agreste, MD  gabapentin (NEURONTIN) 300 MG capsule Take 1-2 capsules (300-600 mg total) by mouth 3 (three) times daily. 01/05/17 03/29/17  Wendie Agreste, MD  glucose blood test strip 1 each by Other route 4 (four) times daily. Use with One touch meter to check blood sugar. Dx E11.9 09/13/16   Mikey College, NP  hydrochlorothiazide (HYDRODIURIL) 25 MG tablet Take 1 tablet (25 mg total) by mouth daily. 02/04/17   Wendie Agreste, MD  ibuprofen (ADVIL) 200 MG tablet Take 3 tablets (600 mg total) by mouth every 6 (six) hours as needed for moderate pain. 12/28/16   Milly Jakob, MD  Insulin Pen Needle (BD PEN NEEDLE NANO U/F) 32G X 4 MM MISC Inject 1 each as directed 3 (three) times daily. 01/07/17   Wendie Agreste, MD  LEVEMIR FLEXTOUCH 100 UNIT/ML Pen inject 54 units subcutaneously twice a day 01/05/17   Wendie Agreste, MD  losartan (COZAAR) 100 MG tablet Take 1 tablet  (100 mg total) by mouth daily. 01/05/17   Wendie Agreste, MD  metFORMIN (GLUCOPHAGE) 500 MG tablet Take 2 tablets (1,000 mg total) by mouth 2 (two) times daily. 08/16/16   Karamalegos, Devonne Doughty, DO  omeprazole (PRILOSEC) 40 MG capsule Take 40 mg by mouth daily.    [provider]  ONE TOUCH LANCETS MISC 1 each by Does not apply route 4 (four) times daily. Use with one touch meter to check blood sugar. Dx: E11.9 09/16/16   Mikey College, NP  oxyCODONE (ROXICODONE) 5 MG immediate release tablet Take 1 tablet (5 mg total) by mouth every 6 (six) hours as needed for breakthrough pain. 12/28/16   Milly Jakob, MD  pravastatin (PRAVACHOL) 20 MG tablet Take 1 tablet (20 mg total) by mouth daily. 01/05/17   Wendie Agreste, MD  vitamin B-12 (CYANOCOBALAMIN) 1000 MCG tablet Take 1,000 mcg by mouth daily.    [provider]   Social  History   Social History  . Marital status: Single    Spouse name: N/A  . Number of children: N/A  . Years of education: N/A   Occupational History  . Not on file.   Social History Main Topics  . Smoking status: Former Smoker    Packs/day: 2.00    Years: 20.00    Types: Cigarettes    Quit date: 06/28/1996  . Smokeless tobacco: Never Used  . Alcohol use 0.0 oz/week     Comment: occasional  . Drug use: No  . Sexual activity: Not on file   Other Topics Concern  . Not on file   Social History Narrative  . No narrative on file   Review of Systems  Constitutional: Negative for fatigue and unexpected weight change.  Eyes: Negative for visual disturbance.  Respiratory: Negative for cough, chest tightness and shortness of breath.   Cardiovascular: Negative for chest pain, palpitations and leg swelling.  Gastrointestinal: Negative for abdominal pain and blood in stool.  Musculoskeletal: Positive for gait problem.  Neurological: Positive for numbness (in feet). Negative for dizziness, light-headedness and headaches.      Objective:    Physical Exam  Constitutional: He is oriented to person, place, and time. He appears well-developed and well-nourished.  HENT:  Head: Normocephalic and atraumatic.  Eyes: Pupils are equal, round, and reactive to light. EOM are normal.  Neck: No JVD present. Carotid bruit is not present.  Cardiovascular: Regular rhythm and normal heart sounds.  Tachycardia present.   No murmur heard. Pulses:      Dorsalis pedis pulses are 1+ on the right side, and 1+ on the left side.  Pulmonary/Chest: Effort normal and breath sounds normal. He has no rales.  Musculoskeletal: He exhibits no edema.  Trace to 1+ edema at lower ankles. Missing some great toenails. Some dry skins but otherwise intact without ulcers or wounds.  Negative tinel.  Neurological: He is alert and oriented to person, place, and time. He displays a negative Romberg sign.  No pronator drift. Strength intact with grip strength.   Skin: Skin is warm and dry.  Healed scar on the L, healing scar on the R.  Psychiatric: He has a normal mood and affect.  Vitals reviewed.  Vitals:   02/18/17 1000  BP: 118/70  Pulse: (!) 116  Resp: 16  Temp: 98.6 F (37 C)  SpO2: 98%  Weight: (!) 376 lb (170.6 kg)  Height: _0  (2.057 m)      Assessment & Plan:   Humbert Morozov is a 61 y.o. male Essential hypertension  - Stable on new regimen. Tolerating current meds.  Reactive depression - Plan: escitalopram (LEXAPRO) 10 MG tablet Marital conflict - Plan: escitalopram (LEXAPRO) 10 MG tablet  -Improving with Lexapro and counseling. No changes for now. (Patient instructions should indicate Lexapro as SSRI, not Zoloft. No changes were made)  History of carpal tunnel repair Diabetic polyneuropathy associated with type 2 diabetes mellitus ([place])  -History of diabetic neuropathy, prior falls thought to be due to neuropathy apart. Tolerating gabapentin, can continue to work on increasing doses if needed. Paperwork completed, but asked that he have  orthopedist, neuro, or possible occupational therapist complete other assessment on specific activities  Meds ordered this encounter  Medications  . escitalopram (LEXAPRO) 10 MG tablet    Sig: Take 1 tablet (10 mg total) by mouth daily.    Dispense:  30 tablet    Refill:  1   Patient Instructions  Blood pressure looks better today - continue same dose of meds for now.   Continue counseling and Zoloft for depression. We can follow up on this further in 2-3 months.   For numbness in feet, it is most likely due to diabetic neuropathy. For balance I would recommend continuing home balance exercises. Continue gabapentin 3 times per day for now. Please bring medication by or call with dose/name of med you are taking at night.  If other specific information needed on your forms, that may need to be completed by neurology, orthopedics, or may need an assessment with occupational therapist.  Please follow-up within the next month for diabetes  IF you received an x-ray today, you will receive an invoice from [facility]. Please contact [facility] at [phone] with questions or concerns regarding your invoice.   IF you received labwork today, you will receive an invoice from [facility]. Please contact [name] at [phone] with questions or concerns regarding your invoice.   Our billing staff will not be able to assist you with questions regarding bills from these companies.  You will be contacted with the lab results as soon as they are available. The fastest way to get your results is to activate your [name] account. Instructions are located on the last page of this paperwork. If you have not heard from [name] regarding the results in 2 weeks, please contact this office.       I personally performed the services described in this documentation, which was scribed in my presence. The recorded information has been reviewed and considered for accuracy and completeness,  addended by me as needed, and agree with information above.  Signed,   [name], MD [facility].  02/20/17 10:17 PM

## 2017-02-18 NOTE — Patient Instructions (Addendum)
  Blood pressure looks better today - continue same dose of meds for now.   Continue counseling and Zoloft for depression. We can follow up on this further in 2-3 months.   For numbness in feet, it is most likely due to diabetic neuropathy. For balance I would recommend continuing home balance exercises. Continue gabapentin 3 times per day for now. Please bring medication by or call with dose/name of med you are taking at night.  If other specific information needed on your forms, that may need to be completed by neurology, orthopedics, or may need an assessment with occupational therapist.  Please follow-up within the next month for diabetes  IF you received an x-ray today, you will receive an invoice from [facility]. Please contact [facility] at [phone] with questions or concerns regarding your invoice.   IF you received labwork today, you will receive an invoice from [facility]. Please contact [name] at [phone] with questions or concerns regarding your invoice.   Our billing staff will not be able to assist you with questions regarding bills from these companies.  You will be contacted with the lab results as soon as they are available. The fastest way to get your results is to activate your [name] account. Instructions are located on the last page of this paperwork. If you have not heard from [name] regarding the results in 2 weeks, please contact this office.

## 2017-02-18 NOTE — Telephone Encounter (Signed)
Ok - would be careful combining that with gabapentin, especially if we end up increasing doses gabapentin. If he is feeling unsteady or sedated in the morning, would pick one or the other

## 2017-02-18 NOTE — Telephone Encounter (Signed)
Pt was asked to CB and let Dr. Neva Seat know-he is taking Nortrityline  that was prescribed by Dr. Janee Morn.

## 2017-02-18 NOTE — Telephone Encounter (Signed)
Pt called back and stated that he would like a referral for a sleep study sent to sleep med therapy in [place] on [address] their phone number is [phone].Marland Kitchen   Please advise

## 2017-02-18 NOTE — Telephone Encounter (Signed)
Pt called 02/18/17 to see if Dr. Neva Seat could set up a sleep study for him. I asked where he would like it set up and he stated that he did not know, only somewhere in [place]. I tried to explain to him that I would need to know the name of the facility and he hung the phone up.   Please advise

## 2017-02-21 NOTE — Telephone Encounter (Signed)
Please advise 

## 2017-02-21 NOTE — Telephone Encounter (Signed)
Patient advised.

## 2017-02-22 NOTE — Telephone Encounter (Signed)
Referral placed.

## 2017-03-01 ENCOUNTER — Encounter: Payer: Self-pay | Admitting: Family Medicine

## 2017-03-03 ENCOUNTER — Telehealth: Payer: Self-pay | Admitting: Family Medicine

## 2017-03-03 NOTE — Telephone Encounter (Signed)
Pt called and left vm stating that he gave us the wrong fax number for [facility] sleep study the right number he gave is [phone].. Call pt and left vm for him to call back because the place that was requested for him was the sleep med therapy in [place].

## 2017-03-09 ENCOUNTER — Encounter: Payer: Self-pay | Admitting: Family Medicine

## 2017-03-09 NOTE — Telephone Encounter (Signed)
Patient calling stating that [facility] need to find out which concentration for new c pap unit

## 2017-03-10 NOTE — Telephone Encounter (Signed)
Pt called and message was to be he needs new prescription for a CPAP machine sent to [facility] [place].

## 2017-03-10 NOTE — Telephone Encounter (Signed)
[facility] sent order form to be filled out by provider. Dr. Neva SeatGreene filled this out and I faxed it and received confirmation on 03/09/17 I circled the request for new equipment written by Dr. Chilton SiGreen as [facility] and re-faxed and received confirmation on 03/10/17. The pt has been scheduled with [facility] on 03/16/17

## 2017-03-16 ENCOUNTER — Ambulatory Visit: Payer: BC Managed Care – PPO | Attending: Neurology

## 2017-03-16 DIAGNOSIS — G4733 Obstructive sleep apnea (adult) (pediatric): Secondary | ICD-10-CM | POA: Insufficient documentation

## 2017-03-24 ENCOUNTER — Other Ambulatory Visit: Payer: Self-pay | Admitting: Family Medicine

## 2017-03-24 DIAGNOSIS — E785 Hyperlipidemia, unspecified: Secondary | ICD-10-CM

## 2017-03-31 ENCOUNTER — Telehealth: Payer: Self-pay

## 2017-03-31 NOTE — Telephone Encounter (Signed)
Please advise.   Copied from CRM [phone]. Topic: Inquiry >> Mar 30, 2017  3:10 PM Nathan KalataMichael, [name], NT wrote: Reason for CRM: pt is calling in regards to his sleep study results. Would like a call back

## 2017-04-04 ENCOUNTER — Telehealth: Payer: Self-pay

## 2017-04-04 NOTE — Telephone Encounter (Signed)
Copied from CRM [phone]. Topic: Inquiry >> Apr 04, 2017 10:33 AM Viviann SpareWhite, Selina wrote: Reason for CRM: Patient would like a copy of his sleep study results mailed to him.

## 2017-04-10 ENCOUNTER — Encounter: Payer: Self-pay | Admitting: Family Medicine

## 2017-04-11 ENCOUNTER — Telehealth: Payer: Self-pay | Admitting: Family Medicine

## 2017-04-11 NOTE — Telephone Encounter (Signed)
Copied from CRM [id number]. Topic: General - Other >> Apr 11, 2017 11:29 AM Gerrianne ScalePayne, Cleotis Sparr L wrote: Reason for CRM:  patient states that Pattricia BossAnnie or Addie had called him so he's returning the call

## 2017-04-26 ENCOUNTER — Other Ambulatory Visit: Payer: Self-pay | Admitting: Orthopedic Surgery

## 2017-04-26 DIAGNOSIS — M5442 Lumbago with sciatica, left side: Secondary | ICD-10-CM

## 2017-05-04 ENCOUNTER — Ambulatory Visit
Admission: RE | Admit: 2017-05-04 | Discharge: 2017-05-04 | Disposition: A | Discharge status: Home / Self Care | Payer: BC Managed Care – PPO | Source: Ambulatory Visit | Attending: Orthopedic Surgery | Admitting: Orthopedic Surgery

## 2017-05-04 ENCOUNTER — Ambulatory Visit: Payer: BC Managed Care – PPO | Admitting: Emergency Medicine

## 2017-05-04 DIAGNOSIS — M5442 Lumbago with sciatica, left side: Secondary | ICD-10-CM

## 2017-05-04 DIAGNOSIS — Z9889 Other specified postprocedural states: Secondary | ICD-10-CM | POA: Insufficient documentation

## 2017-05-04 HISTORY — DX: Other specified postprocedural states: Z98.890

## 2017-05-16 ENCOUNTER — Other Ambulatory Visit: Payer: Self-pay | Admitting: Family Medicine

## 2017-05-16 DIAGNOSIS — E119 Type 2 diabetes mellitus without complications: Secondary | ICD-10-CM

## 2017-05-21 ENCOUNTER — Encounter: Payer: Self-pay | Admitting: Family Medicine

## 2017-05-31 ENCOUNTER — Encounter: Payer: Self-pay | Admitting: Family Medicine

## 2017-05-31 ENCOUNTER — Ambulatory Visit: Payer: BC Managed Care – PPO | Admitting: Family Medicine

## 2017-05-31 ENCOUNTER — Ambulatory Visit (INDEPENDENT_AMBULATORY_CARE_PROVIDER_SITE_OTHER): Payer: BC Managed Care – PPO

## 2017-05-31 ENCOUNTER — Other Ambulatory Visit: Payer: Self-pay

## 2017-05-31 VITALS — BP 168/88 | HR 94 | Temp 98.3°F | Resp 16 | Ht >= 80 in | Wt 384.0 lb

## 2017-05-31 DIAGNOSIS — S29019A Strain of muscle and tendon of unspecified wall of thorax, initial encounter: Secondary | ICD-10-CM | POA: Diagnosis not present

## 2017-05-31 DIAGNOSIS — M546 Pain in thoracic spine: Secondary | ICD-10-CM | POA: Diagnosis not present

## 2017-05-31 DIAGNOSIS — R0789 Other chest pain: Secondary | ICD-10-CM

## 2017-05-31 MED ORDER — CYCLOBENZAPRINE HCL 5 MG PO TABS: 5.0000 mg | ORAL_TABLET | Freq: Three times a day (TID) | ORAL | 0 refills | Status: DC | PRN

## 2017-05-31 NOTE — Progress Notes (Signed)
Subjective:  By signing my name below, I, [name], attest that this documentation has been prepared under the direction and in the presence of [name], MD. Electronically Signed: [name], Alexander. 05/31/2017 , 5:14 PM .  Patient was seen in Room 3 .   Patient ID: Nathan Dean, male    DOB: Sep 29, 1955, 62 y.o.   MRN: [id number] Chief Complaint  Patient presents with  . Back Pain    pt states he thinks he pulled a muscle about a month ago. Pt states the pain starts on the ride side and radiates toward the ribs.    HPI Nathan Dean is a 62 y.o. male  Patient is here for back pain. He has a history of multiple medical problems including DM, HTN, hyperlipidemia, obesity and peripheral neuropathy.   Back pain  Patient had an MRI of his back on Dec 19th ordered by orthopedist, Dr. 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   Patient states he initially saw Dr. Berenice Primas, and had MRI done. Then, he was followed up and seen by neurosurgeon Dr. Sherwood Gambler? He has an appointment with Dr. Sherwood Gambler on Friday (in 3 days).   Patient reports twisting his back while in bed 1 month ago. He states having right upper quadrant back pain. He denies any improvement since that time and pain is about the same. He's tried applying icy hot, and salon pas patch without relief. He's been taking Advil 3 times a day without relief either. He denies taking any muscle relaxants recently. He denies chest pain, shortness of breath or rash.   Patient Active Problem List   Diagnosis Date Noted  . Depression 06/03/2015  . HTN (hypertension) 01/02/2015  . Hyperlipidemia 01/02/2015  . 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 Sleep apnea 09/21/2010  . Arthritis, degenerative 02/17/2009  . Hereditary and idiopathic peripheral neuropathy 11/21/2008  .  AD (atopic dermatitis) 07/08/2008  . Acid reflux 04/05/2007   Past Medical History:  Diagnosis Date  . Diabetes ([place])   . GERD (gastroesophageal reflux disease)   . Hyperlipidemia   . Hypertension   . Sleep apnea    Past Surgical History:  Procedure Laterality Date  . CARPAL TUNNEL RELEASE Right 12/28/2016   Procedure: RIGHT ULNAR AND MEDIAN NEUROPLASTY AT WRIST;  Surgeon: Milly Jakob, MD;  Location: [place];  Service: Orthopedics;  Laterality: Right;  . REPLACEMENT TOTAL KNEE BILATERAL  2014   left 2012 rt 2014   Allergies  Allergen Reactions  . Penicillins Anaphylaxis, Rash and Other (See Comments)  . Succinylcholine Chloride Anaphylaxis and Rash  . Keflex [Cephalexin] Rash  . Sulfa Antibiotics Rash and Other (See Comments)   Prior to Admission medications   Medication Sig Start Date End Date Taking? Authorizing Provider  acetaminophen (TYLENOL) 325 MG tablet Take 2 tablets (650 mg total) by mouth every 6 (six) hours as needed for mild pain or moderate pain. 12/28/16   Milly Jakob, MD  albuterol (PROVENTIL HFA;VENTOLIN HFA) 108 (90 BASE) MCG/ACT inhaler Inhale 2 puffs into the lungs every 6 (six) hours as needed for wheezing or shortness of breath. 04/15/15   Arlis Porta., MD  amLODipine (NORVASC) 10 MG tablet Take 0.5 tablets (5 mg total) by mouth daily. 08/11/16   Karamalegos, Devonne Doughty, DO  aspirin 81 MG tablet Take 1 tablet (81 mg total)  by mouth daily. 08/11/16   Karamalegos, Devonne Doughty, DO  Blood Glucose Monitoring Suppl (ONE TOUCH ULTRA SYSTEM KIT) w/Device KIT 1 kit by Does not apply route once. Dx. E11.9 07/28/15   Arlis Porta., MD  carvedilol (COREG) 6.25 MG tablet Take 1 tablet (6.25 mg total) by mouth 2 (two) times daily with a meal. 01/05/17   Wendie Agreste, MD  Cholecalciferol (VITAMIN D3) 5000 UNITS CAPS Take 10,000 Units by mouth daily.     [provider]  Dulaglutide (TRULICITY) 1.5 AV/4.0JW SOPN Inject 1.5 mg  into the skin once a week. 01/05/17   Wendie Agreste, MD  escitalopram (LEXAPRO) 10 MG tablet Take 1 tablet (10 mg total) by mouth daily. 02/18/17   Wendie Agreste, MD  gabapentin (NEURONTIN) 300 MG capsule Take 1-2 capsules (300-600 mg total) by mouth 3 (three) times daily. 01/05/17 03/29/17  Wendie Agreste, MD  glucose blood test strip 1 each by Other route 4 (four) times daily. Use with One touch meter to check blood sugar. Dx E11.9 09/13/16   Mikey College, NP  hydrochlorothiazide (HYDRODIURIL) 25 MG tablet Take 1 tablet (25 mg total) by mouth daily. 02/04/17   Wendie Agreste, MD  ibuprofen (ADVIL) 200 MG tablet Take 3 tablets (600 mg total) by mouth every 6 (six) hours as needed for moderate pain. 12/28/16   Milly Jakob, MD  Insulin Pen Needle (BD PEN NEEDLE NANO U/F) 32G X 4 MM MISC Inject 1 each as directed 3 (three) times daily. 01/07/17   Wendie Agreste, MD  LEVEMIR FLEXTOUCH 100 UNIT/ML Pen inject 54 units subcutaneously twice a day 01/05/17   Wendie Agreste, MD  losartan (COZAAR) 100 MG tablet Take 1 tablet (100 mg total) by mouth daily. 01/05/17   Wendie Agreste, MD  metFORMIN (GLUCOPHAGE) 500 MG tablet Take 2 tablets (1,000 mg total) by mouth 2 (two) times daily. 08/16/16   Karamalegos, Devonne Doughty, DO  omeprazole (PRILOSEC) 40 MG capsule Take 40 mg by mouth daily.    [provider]  ONE TOUCH LANCETS MISC 1 each by Does not apply route 4 (four) times daily. Use with one touch meter to check blood sugar. Dx: E11.9 09/16/16   Mikey College, NP  oxyCODONE (ROXICODONE) 5 MG immediate release tablet Take 1 tablet (5 mg total) by mouth every 6 (six) hours as needed for breakthrough pain. 12/28/16   Milly Jakob, MD  pravastatin (PRAVACHOL) 20 MG tablet TAKE 1 TABLET BY MOUTH ONCE DAILY 03/25/17   Wendie Agreste, MD  vitamin B-12 (CYANOCOBALAMIN) 1000 MCG tablet Take 1,000 mcg by mouth daily.    [provider]   Social History    Socioeconomic History  . Marital status: Married    Spouse name: Not on file  . Number of children: Not on file  . Years of education: Not on file  . Highest education level: Not on file  Social Needs  . Financial resource strain: Not on file  . Food insecurity - worry: Not on file  . Food insecurity - inability: Not on file  . Transportation needs - medical: Not on file  . Transportation needs - non-medical: Not on file  Occupational History  . Not on file  Tobacco Use  . Smoking status: Former Smoker    Packs/day: 2.00    Years: 20.00    Pack years: 40.00    Types: Cigarettes    Last attempt to quit: 06/28/1996  Years since quitting: 20.9  . Smokeless tobacco: Never Used  Substance and Sexual Activity  . Alcohol use: Yes    Alcohol/week: 0.0 oz    Comment: occasional  . Drug use: No  . Sexual activity: Not on file  Other Topics Concern  . Not on file  Social History Narrative  . Not on file   Review of Systems  Constitutional: Negative for fatigue and unexpected weight change.  Eyes: Negative for visual disturbance.  Respiratory: Negative for cough, chest tightness and shortness of breath.   Cardiovascular: Negative for chest pain, palpitations and leg swelling.  Gastrointestinal: Negative for abdominal pain and blood in stool.  Musculoskeletal: Positive for back pain.  Skin: Negative for rash.  Neurological: Negative for dizziness, light-headedness and headaches.       Objective:   Physical Exam  Constitutional: He is oriented to person, place, and time. He appears well-developed and well-nourished.  HENT:  Head: Normocephalic and atraumatic.  Eyes: EOM are normal. Pupils are equal, round, and reactive to light.  Neck: No JVD present. Carotid bruit is not present.  Cardiovascular: Normal rate, regular rhythm and normal heart sounds.  No murmur heard. Pulmonary/Chest: Effort normal and breath sounds normal. He has no rales.  Musculoskeletal: He exhibits  no edema.  Back: no midline thoracic spine tenderness, some tenderness along lower thoracic paraspinals into right chest wall posteriorly, diffuse tenderness over right chest wall; tenderness extends to the mild axillary line on the right  Neurological: He is alert and oriented to person, place, and time.  Skin: Skin is warm and dry.  Psychiatric: He has a normal mood and affect.  Vitals reviewed.   Vitals:   05/31/17 1615  BP: (!) 168/88  Pulse: 94  Resp: 16  Temp: 98.3 F (36.8 C)  TempSrc: Oral  SpO2: 94%  Weight: (!) 384 lb (174.2 kg)  Height: 6' 9" (2.057 m)   Dg Ribs Unilateral W/chest Right  Result Date: 05/31/2017 CLINICAL DATA:  Right-sided chest wall pain. Twisting injury 1 month ago. EXAM: RIGHT RIBS AND CHEST - 3+ VIEW COMPARISON:  04/12/2016, chest radiograph. FINDINGS: No rib fracture or rib lesion. Heart, mediastinum and hila are unremarkable. Lungs are clear. No pleural effusion or pneumothorax. IMPRESSION: Negative. Electronically Signed   By: Lajean Manes M.D.   On: 05/31/2017 17:34       Assessment & Plan:    Nathan Dean is a 62 y.o. male Right-sided chest wall pain - Plan: DG Ribs Unilateral W/Chest Right, cyclobenzaprine (FLEXERIL) 5 MG tablet  Acute right-sided thoracic back pain - Plan: DG Ribs Unilateral W/Chest Right, cyclobenzaprine (FLEXERIL) 5 MG tablet  Thoracic myofascial strain, initial encounter  Suspected thoracic strain versus neuropathy from thoracic spine. X-ray of ribs unrevealing as above. We'll try initial muscle relaxant, potential side effects discussed. RTC precautions if not improving in next week, and follow-up as planned with neurosurgeon for low back pain.  Meds ordered this encounter  Medications  . cyclobenzaprine (FLEXERIL) 5 MG tablet    Sig: Take 1 tablet (5 mg total) by mouth 3 (three) times daily as needed. Start with one pill by mouth each bedtime as needed due to sedation    Dispense:  30 tablet    Refill:  0    Patient Instructions   Keep appointment with neurosurgeon in few days for lower back pain.  Upper back and right-sided chest wall pain may be a pinched nerve or spasm in the muscle versus other causes of chest  wall pain as listed below. Okay to try muscle relaxant temporarily with recheck in 1 week. Plan for follow-up on diabetes and other medications at that visit, so fasting blood work would be best.  Return to the clinic or go to the nearest emergency room if any of your symptoms worsen or new symptoms occur.   Chest Wall Pain Chest wall pain is pain in or around the bones and muscles of your chest. Sometimes, an injury causes this pain. Sometimes, the cause may not be known. This pain may take several weeks or longer to get better. Follow these instructions at home: Pay attention to any changes in your symptoms. Take these actions to help with your pain:  Rest as told by your health care provider.  Avoid activities that cause pain. These include any activities that use your chest muscles or your abdominal and side muscles to lift heavy items.  If directed, apply ice to the painful area: ? Put ice in a plastic bag. ? Place a towel between your skin and the bag. ? Leave the ice on for 20 minutes, 2-3 times per day.  Take over-the-counter and prescription medicines only as told by your health care provider.  Do not use tobacco products, including cigarettes, chewing tobacco, and e-cigarettes. If you need help quitting, ask your health care provider.  Keep all follow-up visits as told by your health care provider. This is important.  Contact a health care provider if:  You have a fever.  Your chest pain becomes worse.  You have new symptoms. Get help right away if:  You have nausea or vomiting.  You feel sweaty or light-headed.  You have a cough with phlegm (sputum) or you cough up blood.  You develop shortness of breath. This information is not intended to replace  advice given to you by your health care provider. Make sure you discuss any questions you have with your health care provider. Document Released: 05/03/2005 Document Revised: 09/11/2015 Document Reviewed: 07/29/2014 Elsevier Interactive Patient Education  2018 [employer].  Thoracic Strain A thoracic strain, which is sometimes called a mid-back strain, is an injury to the muscles or tendons that attach to the upper part of your back behind your chest. This type of injury occurs when a muscle is overstretched or overloaded. Thoracic strains can range from mild to severe. Mild strains may involve stretching a muscle or tendon without tearing it. These injuries may heal in 1-2 weeks. More severe strains involve tearing of muscle fibers or tendons. These will cause more pain and may take 6-8 weeks to heal. What are the causes? This condition may be caused by:  An injury in which a sudden force is placed on the muscle.  Exercising without properly warming up.  Overuse of the muscle.  Improper form during certain movements.  Other injuries that surround or cause stress on the mid-back, causing a strain on the muscles.  In some cases, the cause may not be known. What increases the risk? This injury is more common in:  Athletes.  People with obesity.  What are the signs or symptoms? The main symptom of this condition is pain, especially with movement. Other symptoms include:  Bruising.  Swelling.  Spasm.  How is this diagnosed? This condition may be diagnosed with a physical exam. X-rays may be taken to check for a fracture. How is this treated? This condition may be treated with:  Resting and icing the injured area.  Physical therapy. This will involve  doing stretching and strengthening exercises.  Medicines for pain and inflammation.  Follow these instructions at home:  Rest as needed. Follow instructions from your health care provider about any restrictions on  activity.  If directed, apply ice to the injured area: ? Put ice in a plastic bag. ? Place a towel between your skin and the bag. ? Leave the ice on for 20 minutes, 2-3 times per day.  Take over-the-counter and prescription medicines only as told by your health care provider.  Begin doing exercises as told by your health care provider or physical therapist.  Always warm up properly before physical activity or sports.  Bend your knees before you lift heavy objects.  Keep all follow-up visits as told by your health care provider. This is important. Contact a health care provider if:  Your pain is not helped by medicine.  Your pain, bruising, or swelling is getting worse.  You have a fever. Get help right away if:  You have shortness of breath.  You have chest pain.  You develop numbness or weakness in your legs.  You have involuntary loss of urine (urinary incontinence). This information is not intended to replace advice given to you by your health care provider. Make sure you discuss any questions you have with your health care provider. Document Released: 07/24/2003 Document Revised: 01/03/2016 Document Reviewed: 06/27/2014 Elsevier Interactive Patient Education  2018 [employer].      IF you received an x-ray today, you will receive an invoice from [facility]. Please contact [facility] at [phone] with questions or concerns regarding your invoice.   IF you received labwork today, you will receive an invoice from [place]. Please contact [name] at [phone] with questions or concerns regarding your invoice.   Our billing staff will not be able to assist you with questions regarding bills from these companies.  You will be contacted with the lab results as soon as they are available. The fastest way to get your results is to activate your [name] account. Instructions are located on the last page of this paperwork. If you have not heard  from [name] regarding the results in 2 weeks, please contact this office.       I personally performed the services described in this documentation, which was scribed in my presence. The recorded information has been reviewed and considered for accuracy and completeness, addended by me as needed, and agree with information above.  Signed,   [name], MD [facility].  06/01/17 4:29 PM

## 2017-05-31 NOTE — Patient Instructions (Addendum)
Keep appointment with neurosurgeon in few days for lower back pain.  Upper back and right-sided chest wall pain may be a pinched nerve or spasm in the muscle versus other causes of chest wall pain as listed below. Okay to try muscle relaxant temporarily with recheck in 1 week. Plan for follow-up on diabetes and other medications at that visit, so fasting blood work would be best.  Return to the clinic or go to the nearest emergency room if any of your symptoms worsen or new symptoms occur.   Chest Wall Pain Chest wall pain is pain in or around the bones and muscles of your chest. Sometimes, an injury causes this pain. Sometimes, the cause may not be known. This pain may take several weeks or longer to get better. Follow these instructions at home: Pay attention to any changes in your symptoms. Take these actions to help with your pain:  Rest as told by your health care provider.  Avoid activities that cause pain. These include any activities that use your chest muscles or your abdominal and side muscles to lift heavy items.  If directed, apply ice to the painful area: ? Put ice in a plastic bag. ? Place a towel between your skin and the bag. ? Leave the ice on for 20 minutes, 2-3 times per day.  Take over-the-counter and prescription medicines only as told by your health care provider.  Do not use tobacco products, including cigarettes, chewing tobacco, and e-cigarettes. If you need help quitting, ask your health care provider.  Keep all follow-up visits as told by your health care provider. This is important.  Contact a health care provider if:  You have a fever.  Your chest pain becomes worse.  You have new symptoms. Get help right away if:  You have nausea or vomiting.  You feel sweaty or light-headed.  You have a cough with phlegm (sputum) or you cough up blood.  You develop shortness of breath. This information is not intended to replace advice given to you by your  health care provider. Make sure you discuss any questions you have with your health care provider. Document Released: 05/03/2005 Document Revised: 09/11/2015 Document Reviewed: 07/29/2014 Elsevier Interactive Patient Education  2018 [employer]  Thoracic Strain A thoracic strain, which is sometimes called a mid-back strain, is an injury to the muscles or tendons that attach to the upper part of your back behind your chest. This type of injury occurs when a muscle is overstretched or overloaded. Thoracic strains can range from mild to severe. Mild strains may involve stretching a muscle or tendon without tearing it. These injuries may heal in 1-2 weeks. More severe strains involve tearing of muscle fibers or tendons. These will cause more pain and may take 6-8 weeks to heal. What are the causes? This condition may be caused by:  An injury in which a sudden force is placed on the muscle.  Exercising without properly warming up.  Overuse of the muscle.  Improper form during certain movements.  Other injuries that surround or cause stress on the mid-back, causing a strain on the muscles.  In some cases, the cause may not be known. What increases the risk? This injury is more common in:  Athletes.  People with obesity.  What are the signs or symptoms? The main symptom of this condition is pain, especially with movement. Other symptoms include:  Bruising.  Swelling.  Spasm.  How is this diagnosed? This condition may be diagnosed with a  physical exam. X-rays may be taken to check for a fracture. How is this treated? This condition may be treated with:  Resting and icing the injured area.  Physical therapy. This will involve doing stretching and strengthening exercises.  Medicines for pain and inflammation.  Follow these instructions at home:  Rest as needed. Follow instructions from your health care provider about any restrictions on activity.  If directed, apply ice  to the injured area: ? Put ice in a plastic bag. ? Place a towel between your skin and the bag. ? Leave the ice on for 20 minutes, 2-3 times per day.  Take over-the-counter and prescription medicines only as told by your health care provider.  Begin doing exercises as told by your health care provider or physical therapist.  Always warm up properly before physical activity or sports.  Bend your knees before you lift heavy objects.  Keep all follow-up visits as told by your health care provider. This is important. Contact a health care provider if:  Your pain is not helped by medicine.  Your pain, bruising, or swelling is getting worse.  You have a fever. Get help right away if:  You have shortness of breath.  You have chest pain.  You develop numbness or weakness in your legs.  You have involuntary loss of urine (urinary incontinence). This information is not intended to replace advice given to you by your health care provider. Make sure you discuss any questions you have with your health care provider. Document Released: 07/24/2003 Document Revised: 01/03/2016 Document Reviewed: 06/27/2014 Elsevier Interactive Patient Education  2018 [employer].      IF you received an x-ray today, you will receive an invoice from [facility]. Please contact [facility] at [phone] with questions or concerns regarding your invoice.   IF you received labwork today, you will receive an invoice from [place]. Please contact [name] at [phone] with questions or concerns regarding your invoice.   Our billing staff will not be able to assist you with questions regarding bills from these companies.  You will be contacted with the lab results as soon as they are available. The fastest way to get your results is to activate your [name] account. Instructions are located on the last page of this paperwork. If you have not heard from [name] regarding the results in 2  weeks, please contact this office.

## 2017-06-01 ENCOUNTER — Encounter: Payer: Self-pay | Admitting: Family Medicine

## 2017-06-06 ENCOUNTER — Other Ambulatory Visit: Payer: Self-pay | Admitting: Family Medicine

## 2017-06-06 DIAGNOSIS — Z630 Problems in relationship with spouse or partner: Secondary | ICD-10-CM

## 2017-06-06 DIAGNOSIS — F329 Major depressive disorder, single episode, unspecified: Secondary | ICD-10-CM

## 2017-06-07 NOTE — Telephone Encounter (Signed)
Ordered by Dr. Chilton SiGreen on 02/18/17 30 tab with 1 refill.    Which has been refilled.

## 2017-07-04 ENCOUNTER — Other Ambulatory Visit: Payer: Self-pay | Admitting: Family Medicine

## 2017-07-04 DIAGNOSIS — Z630 Problems in relationship with spouse or partner: Secondary | ICD-10-CM

## 2017-07-04 DIAGNOSIS — F329 Major depressive disorder, single episode, unspecified: Secondary | ICD-10-CM

## 2017-07-05 ENCOUNTER — Encounter: Payer: Self-pay | Admitting: Family Medicine

## 2017-07-05 ENCOUNTER — Ambulatory Visit: Payer: BC Managed Care – PPO | Admitting: Family Medicine

## 2017-07-05 ENCOUNTER — Other Ambulatory Visit: Payer: Self-pay

## 2017-07-05 VITALS — BP 122/62 | HR 96 | Temp 97.9°F | Ht >= 80 in | Wt 373.0 lb

## 2017-07-05 DIAGNOSIS — R05 Cough: Secondary | ICD-10-CM | POA: Diagnosis not present

## 2017-07-05 DIAGNOSIS — J040 Acute laryngitis: Secondary | ICD-10-CM

## 2017-07-05 DIAGNOSIS — R059 Cough, unspecified: Secondary | ICD-10-CM

## 2017-07-05 MED ORDER — BENZONATATE 100 MG PO CAPS
100.0000 mg | ORAL_CAPSULE | Freq: Three times a day (TID) | ORAL | 0 refills | Status: DC | PRN
Start: 2017-07-05 — End: 2017-09-06

## 2017-07-05 NOTE — Patient Instructions (Addendum)
     IF you received an x-ray today, you will receive an invoice from [facility]. Please contact [facility] at [phone] with questions or concerns regarding your invoice.   IF you received labwork today, you will receive an invoice from [name]. Please contact [name] at [phone] with questions or concerns regarding your invoice.   Our billing staff will not be able to assist you with questions regarding bills from these companies.  You will be contacted with the lab results as soon as they are available. The fastest way to get your results is to activate your [name] account. Instructions are located on the last page of this paperwork. If you have not heard from us regarding the results in 2 weeks, please contact this office.      Laryngitis Laryngitis is swelling (inflammation) of your vocal cords. This causes hoarseness, coughing, loss of voice, sore throat, or a dry throat. When your vocal cords are inflamed, your voice sounds different. Laryngitis can be temporary (acute) or long-term (chronic). Most cases of acute laryngitis improve with time. Chronic laryngitis is laryngitis that lasts for more than three weeks. Follow these instructions at home:  Drink enough fluid to keep your pee (urine) clear or pale yellow.  Breathe in moist air. Use a humidifier if you live in a dry climate.  Take medicines only as told by your doctor.  Do not smoke cigarettes or electronic cigarettes. If you need help quitting, ask your doctor.  Talk as little as possible. Also avoid whispering, which can cause vocal strain.  Write instead of talking. Do this until your voice is back to normal. Contact a doctor if:  You have a fever.  Your pain is worse.  You have trouble swallowing. Get help right away if:  You cough up blood.  You have trouble breathing. This information is not intended to replace advice given to you by your health care provider. Make sure you  discuss any questions you have with your health care provider. Document Released: 04/22/2011 Document Revised: 10/09/2015 Document Reviewed: 10/16/2013 Elsevier Interactive Patient Education  2018 Elsevier Inc.  

## 2017-07-05 NOTE — Progress Notes (Signed)
2/19/20198:14 AM  [name] July 17, 1955, 62 y.o. male [id number]  Chief Complaint  Patient presents with  . Cough    ALL NIGHT COUGHING AND LARYNGITIS    HPI:   Patient is a 62 y.o. male with past medical history significant for DM2, HTN, sleep apnea who presents today for one week of raspy voice, sore throat, cough mostly at night, occasionally with minimal yellow sputum, no SOB, no fever or chills. Has been doing robitussin-DM, has been unable to use his cpap of recent due to coughing. He had flu vaccine this season. He reports getting laryngitis every winter. He works in [employer]. He denies any GERD sx, he does not smoke, he reports his cbgs 150-160s.   Depression screen [facility] 2/9 07/05/2017 05/31/2017 02/18/2017  Decreased Interest 0 0 0  Down, Depressed, Hopeless 0 0 0  PHQ - 2 Score 0 0 0  Altered sleeping - - -  Tired, decreased energy - - -  Change in appetite - - -  Feeling bad or failure about yourself  - - -  Trouble concentrating - - -  Moving slowly or fidgety/restless - - -  Suicidal thoughts - - -  PHQ-9 Score - - -  Difficult doing work/chores - - -    Allergies  Allergen Reactions  . Penicillins Anaphylaxis, Rash and Other (See Comments)  . Succinylcholine Chloride Anaphylaxis and Rash  . Keflex [Cephalexin] Rash  . Sulfa Antibiotics Rash and Other (See Comments)    Prior to Admission medications   Medication Sig Start Date End Date Taking? Authorizing Provider  acetaminophen (TYLENOL) 325 MG tablet Take 2 tablets (650 mg total) by mouth every 6 (six) hours as needed for mild pain or moderate pain. 12/28/16  Yes Milly Jakob, MD  albuterol (PROVENTIL HFA;VENTOLIN HFA) 108 (90 BASE) MCG/ACT inhaler Inhale 2 puffs into the lungs every 6 (six) hours as needed for wheezing or shortness of breath. 04/15/15  Yes Arlis Porta., MD  amLODipine (NORVASC) 10 MG tablet Take 0.5 tablets (5 mg total) by mouth daily. 08/11/16  Yes Karamalegos, Devonne Doughty, DO    aspirin 81 MG tablet Take 1 tablet (81 mg total) by mouth daily. 08/11/16  Yes Karamalegos, Devonne Doughty, DO  Blood Glucose Monitoring Suppl (ONE TOUCH ULTRA SYSTEM KIT) w/Device KIT 1 kit by Does not apply route once. Dx. E11.9 07/28/15  Yes Arlis Porta., MD  carvedilol (COREG) 6.25 MG tablet Take 1 tablet (6.25 mg total) by mouth 2 (two) times daily with a meal. 01/05/17  Yes Wendie Agreste, MD  Cholecalciferol (VITAMIN D3) 5000 UNITS CAPS Take 10,000 Units by mouth daily.    Yes [provider]  cyclobenzaprine (FLEXERIL) 5 MG tablet Take 1 tablet (5 mg total) by mouth 3 (three) times daily as needed. Start with one pill by mouth each bedtime as needed due to sedation 05/31/17  Yes Wendie Agreste, MD  Dulaglutide (TRULICITY) 1.63 YO/3.7CH SOPN Inject 1.5 mg into the skin once a week. 01/05/17  Yes Wendie Agreste, MD  escitalopram (LEXAPRO) 10 MG tablet TAKE 1 TABLET(10 MG) BY MOUTH DAILY 06/07/17  Yes Wendie Agreste, MD  glucose blood test strip 1 each by Other route 4 (four) times daily. Use with One touch meter to check blood sugar. Dx E11.9 09/13/16  Yes Mikey College, NP  hydrochlorothiazide (HYDRODIURIL) 25 MG tablet Take 1 tablet (25 mg total) by mouth daily. 02/04/17  Yes Wendie Agreste, MD  Insulin Pen Needle (BD PEN NEEDLE NANO U/F) 32G X 4 MM MISC Inject 1 each as directed 3 (three) times daily. 01/07/17  Yes Wendie Agreste, MD  LEVEMIR FLEXTOUCH 100 UNIT/ML Pen inject 54 units subcutaneously twice a day 01/05/17  Yes Wendie Agreste, MD  losartan (COZAAR) 100 MG tablet Take 1 tablet (100 mg total) by mouth daily. 01/05/17  Yes Wendie Agreste, MD  metFORMIN (GLUCOPHAGE) 500 MG tablet Take 2 tablets (1,000 mg total) by mouth 2 (two) times daily. 08/16/16  Yes Karamalegos, Devonne Doughty, DO  omeprazole (PRILOSEC) 40 MG capsule Take 40 mg by mouth daily.   Yes [provider]  ONE TOUCH LANCETS MISC 1 each by Does not apply route 4 (four) times daily.  Use with one touch meter to check blood sugar. Dx: E11.9 09/16/16  Yes Mikey College, NP  pravastatin (PRAVACHOL) 20 MG tablet TAKE 1 TABLET BY MOUTH ONCE DAILY 03/25/17  Yes Wendie Agreste, MD  vitamin B-12 (CYANOCOBALAMIN) 1000 MCG tablet Take 1,000 mcg by mouth daily.   Yes [provider]  gabapentin (NEURONTIN) 300 MG capsule Take 1-2 capsules (300-600 mg total) by mouth 3 (three) times daily. 01/05/17 03/29/17  Wendie Agreste, MD    Past Medical History:  Diagnosis Date  . Diabetes ([place])   . GERD (gastroesophageal reflux disease)   . Hyperlipidemia   . Hypertension   . Sleep apnea     Past Surgical History:  Procedure Laterality Date  . CARPAL TUNNEL RELEASE Right 12/28/2016   Procedure: RIGHT ULNAR AND MEDIAN NEUROPLASTY AT WRIST;  Surgeon: Milly Jakob, MD;  Location: [place];  Service: Orthopedics;  Laterality: Right;  . REPLACEMENT TOTAL KNEE BILATERAL  2014   left 2012 rt 2014    Social History   Tobacco Use  . Smoking status: Former Smoker    Packs/day: 2.00    Years: 20.00    Pack years: 40.00    Types: Cigarettes    Last attempt to quit: 06/28/1996    Years since quitting: 21.0  . Smokeless tobacco: Never Used  Substance Use Topics  . Alcohol use: Yes    Alcohol/week: 0.0 oz    Comment: occasional    Family History  Problem Relation Age of Onset  . Cancer Mother   . Kidney failure Father   . Cancer Brother     ROS Per hpi  OBJECTIVE:  Blood pressure 122/62, pulse 96, temperature 97.9 F (36.6 C), temperature source Oral, height _0  (2.057 m), weight (!) 373 lb (169.2 kg), SpO2 99 %.  Physical Exam  Constitutional: He is oriented to person, place, and time and well-developed, well-nourished, and in no distress.  HENT:  Head: Normocephalic and atraumatic.  Right Ear: Hearing, tympanic membrane, external ear and ear canal normal.  Left Ear: Hearing, tympanic membrane, external ear and ear canal normal.    Nose: No mucosal edema or rhinorrhea.  Mouth/Throat: Mucous membranes are normal. Posterior oropharyngeal erythema present. No oropharyngeal exudate.  Eyes: Conjunctivae and EOM are normal. Pupils are equal, round, and reactive to light.  Neck: Neck supple.  Cardiovascular: Normal rate and regular rhythm. Exam reveals no gallop and no friction rub.  No murmur heard. Pulmonary/Chest: Effort normal and breath sounds normal. He has no wheezes. He has no rales.  Lymphadenopathy:    He has no cervical adenopathy.  Neurological: He is alert and oriented to person, place, and time. Gait normal.  Skin: Skin is warm  and dry.     ASSESSMENT and PLAN  1. Laryngitis 2. Cough Discussed supportive measures: increase hydration, rest, OTC medications, etc. RTC precautions discussed. Other orders - benzonatate (TESSALON) 100 MG capsule; Take 1-2 capsules (100-200 mg total) by mouth 3 (three) times daily as needed for cough.  Return if symptoms worsen or fail to improve.    Rutherford Guys, MD [facility], [place] [address] Ph.  [phone] Fax [phone]

## 2017-07-10 ENCOUNTER — Encounter: Payer: Self-pay | Admitting: Family Medicine

## 2017-07-12 ENCOUNTER — Other Ambulatory Visit: Payer: Self-pay

## 2017-07-12 ENCOUNTER — Encounter: Payer: Self-pay | Admitting: Emergency Medicine

## 2017-07-12 ENCOUNTER — Ambulatory Visit: Payer: BC Managed Care – PPO | Admitting: Emergency Medicine

## 2017-07-12 VITALS — BP 150/91 | HR 107 | Temp 98.0°F | Resp 16 | Wt 368.8 lb

## 2017-07-12 DIAGNOSIS — R091 Pleurisy: Secondary | ICD-10-CM | POA: Insufficient documentation

## 2017-07-12 DIAGNOSIS — R05 Cough: Secondary | ICD-10-CM | POA: Diagnosis not present

## 2017-07-12 DIAGNOSIS — J040 Acute laryngitis: Secondary | ICD-10-CM

## 2017-07-12 DIAGNOSIS — J22 Unspecified acute lower respiratory infection: Secondary | ICD-10-CM | POA: Insufficient documentation

## 2017-07-12 DIAGNOSIS — R059 Cough, unspecified: Secondary | ICD-10-CM

## 2017-07-12 MED ORDER — AZITHROMYCIN 250 MG PO TABS: ORAL_TABLET | ORAL | 0 refills | Status: DC

## 2017-07-12 MED ORDER — PROMETHAZINE-CODEINE 6.25-10 MG/5ML PO SYRP
5.0000 mL | ORAL_SOLUTION | Freq: Every evening | ORAL | 0 refills | Status: DC | PRN
Start: 2017-07-12 — End: 2017-09-06

## 2017-07-12 MED ORDER — METHYLPREDNISOLONE 4 MG PO TBPK: ORAL_TABLET | ORAL | 1 refills | Status: DC

## 2017-07-12 NOTE — Patient Instructions (Addendum)
     IF you received an x-ray today, you will receive an invoice from [facility]. Please contact [name] at [phone] with questions or concerns regarding your invoice.   IF you received labwork today, you will receive an invoice from [name]. Please contact [name] at [phone] with questions or concerns regarding your invoice.   Our billing staff will not be able to assist you with questions regarding bills from these companies.  You will be contacted with the lab results as soon as they are available. The fastest way to get your results is to activate your [name] account. Instructions are located on the last page of this paperwork. If you have not heard from us regarding the results in 2 weeks, please contact this office.     Cough, Adult A cough helps to clear your throat and lungs. A cough may last only 2-3 weeks (acute), or it may last longer than 8 weeks (chronic). Many different things can cause a cough. A cough may be a sign of an illness or another medical condition. Follow these instructions at home:  Pay attention to any changes in your cough.  Take medicines only as told by your doctor. ? If you were prescribed an antibiotic medicine, take it as told by your doctor. Do not stop taking it even if you start to feel better. ? Talk with your doctor before you try using a cough medicine.  Drink enough fluid to keep your pee (urine) clear or pale yellow.  If the air is dry, use a cold steam vaporizer or humidifier in your home.  Stay away from things that make you cough at work or at home.  If your cough is worse at night, try using extra pillows to raise your head up higher while you sleep.  Do not smoke, and try not to be around smoke. If you need help quitting, ask your doctor.  Do not have caffeine.  Do not drink alcohol.  Rest as needed. Contact a doctor if:  You have new problems (symptoms).  You cough up yellow fluid  (pus).  Your cough does not get better after 2-3 weeks, or your cough gets worse.  Medicine does not help your cough and you are not sleeping well.  You have pain that gets worse or pain that is not helped with medicine.  You have a fever.  You are losing weight and you do not know why.  You have night sweats. Get help right away if:  You cough up blood.  You have trouble breathing.  Your heartbeat is very fast. This information is not intended to replace advice given to you by your health care provider. Make sure you discuss any questions you have with your health care provider. Document Released: 01/14/2011 Document Revised: 10/09/2015 Document Reviewed: 07/10/2014 Elsevier Interactive Patient Education  2018 Elsevier Inc.  

## 2017-07-12 NOTE — Progress Notes (Signed)
Nathan Dean 62 y.o.   Chief Complaint  Patient presents with  . Cough    productive with yellow mucus x 3days  . chest congestion    HISTORY OF PRESENT ILLNESS: This is a 62 y.o. male diabetic has been sick with flulike symptoms for close to 2 weeks.  Seen here 7 days ago and started on Tessalon Perles for the cough.  Not improving.  Feels like he is getting worse.  Laryngitis not improving.  Still coughing a lot, mostly at night.  Also complaining of pleuritic diffuse chest pain made worse by coughing.  Cough productive of yellow phlegm.  Denies difficulty breathing.  Chest feels very congested, a symptom he is very familiar with.  Denies any other significant symptomatology.  HPI   Prior to Admission medications   Medication Sig Start Date End Date Taking? Authorizing Provider  acetaminophen (TYLENOL) 325 MG tablet Take 2 tablets (650 mg total) by mouth every 6 (six) hours as needed for mild pain or moderate pain. 12/28/16  Yes Milly Jakob, MD  albuterol (PROVENTIL HFA;VENTOLIN HFA) 108 (90 BASE) MCG/ACT inhaler Inhale 2 puffs into the lungs every 6 (six) hours as needed for wheezing or shortness of breath. 04/15/15  Yes Arlis Porta., MD  amLODipine (NORVASC) 10 MG tablet Take 0.5 tablets (5 mg total) by mouth daily. 08/11/16  Yes Karamalegos, Devonne Doughty, DO  aspirin 81 MG tablet Take 1 tablet (81 mg total) by mouth daily. 08/11/16  Yes Karamalegos, Devonne Doughty, DO  benzonatate (TESSALON) 100 MG capsule Take 1-2 capsules (100-200 mg total) by mouth 3 (three) times daily as needed for cough. 07/05/17  Yes Rutherford Guys, MD  Blood Glucose Monitoring Suppl (ONE TOUCH ULTRA SYSTEM KIT) w/Device KIT 1 kit by Does not apply route once. Dx. E11.9 07/28/15  Yes Arlis Porta., MD  carvedilol (COREG) 6.25 MG tablet Take 1 tablet (6.25 mg total) by mouth 2 (two) times daily with a meal. 01/05/17  Yes Wendie Agreste, MD  Cholecalciferol (VITAMIN D3) 5000 UNITS CAPS Take 10,000  Units by mouth daily.    Yes [provider]  cyclobenzaprine (FLEXERIL) 5 MG tablet Take 1 tablet (5 mg total) by mouth 3 (three) times daily as needed. Start with one pill by mouth each bedtime as needed due to sedation 05/31/17  Yes Wendie Agreste, MD  Dulaglutide (TRULICITY) 1.5 EH/6.3JS SOPN Inject 1.5 mg into the skin once a week. 01/05/17  Yes Wendie Agreste, MD  escitalopram (LEXAPRO) 10 MG tablet TAKE 1 TABLET(10 MG) BY MOUTH DAILY 07/05/17  Yes Wendie Agreste, MD  hydrochlorothiazide (HYDRODIURIL) 25 MG tablet Take 1 tablet (25 mg total) by mouth daily. 02/04/17  Yes Wendie Agreste, MD  LEVEMIR FLEXTOUCH 100 UNIT/ML Pen inject 54 units subcutaneously twice a day 01/05/17  Yes Wendie Agreste, MD  losartan (COZAAR) 100 MG tablet Take 1 tablet (100 mg total) by mouth daily. 01/05/17  Yes Wendie Agreste, MD  metFORMIN (GLUCOPHAGE) 500 MG tablet Take 2 tablets (1,000 mg total) by mouth 2 (two) times daily. 08/16/16  Yes Karamalegos, Devonne Doughty, DO  omeprazole (PRILOSEC) 40 MG capsule Take 40 mg by mouth daily.   Yes [provider]  oxyCODONE (ROXICODONE) 5 MG immediate release tablet Take 1 tablet (5 mg total) by mouth every 6 (six) hours as needed for breakthrough pain. 12/28/16  Yes Milly Jakob, MD  pravastatin (PRAVACHOL) 20 MG tablet TAKE 1 TABLET BY MOUTH ONCE DAILY  03/25/17  Yes Wendie Agreste, MD  Pseudoephedrine-Guaifenesin [facility] D PO) Take by mouth 2 (two) times daily.   Yes [provider]  vitamin B-12 (CYANOCOBALAMIN) 1000 MCG tablet Take 1,000 mcg by mouth daily.   Yes [provider]  gabapentin (NEURONTIN) 300 MG capsule Take 1-2 capsules (300-600 mg total) by mouth 3 (three) times daily. 01/05/17 03/29/17  Wendie Agreste, MD  glucose blood test strip 1 each by Other route 4 (four) times daily. Use with One touch meter to check blood sugar. Dx E11.9 09/13/16   Mikey College, NP  Insulin Pen Needle (BD PEN NEEDLE NANO  U/F) 32G X 4 MM MISC Inject 1 each as directed 3 (three) times daily. 01/07/17   Wendie Agreste, MD  ONE TOUCH LANCETS MISC 1 each by Does not apply route 4 (four) times daily. Use with one touch meter to check blood sugar. Dx: E11.9 09/16/16   Mikey College, NP    Allergies  Allergen Reactions  . Penicillins Anaphylaxis, Rash and Other (See Comments)  . Succinylcholine Chloride Anaphylaxis and Rash  . Keflex [Cephalexin] Rash  . Sulfa Antibiotics Rash and Other (See Comments)    Patient Active Problem List   Diagnosis Date Noted  . Depression 06/03/2015  . HTN (hypertension) 01/02/2015  . Hyperlipidemia 01/02/2015  . 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 Sleep apnea 09/21/2010  . Arthritis, degenerative 02/17/2009  . Hereditary and idiopathic peripheral neuropathy 11/21/2008  . AD (atopic dermatitis) 07/08/2008  . Acid reflux 04/05/2007    Past Medical History:  Diagnosis Date  . Diabetes ([place])   . GERD (gastroesophageal reflux disease)   . Hyperlipidemia   . Hypertension   . Sleep apnea     Past Surgical History:  Procedure Laterality Date  . CARPAL TUNNEL RELEASE Right 12/28/2016   Procedure: RIGHT ULNAR AND MEDIAN NEUROPLASTY AT WRIST;  Surgeon: Milly Jakob, MD;  Location: [place];  Service: Orthopedics;  Laterality: Right;  . REPLACEMENT TOTAL KNEE BILATERAL  2014   left 2012 rt 2014    Social History   Socioeconomic History  . Marital status: Married    Spouse name: Not on file  . Number of children: Not on file  . Years of education: Not on file  . Highest education level: Not on file  Social Needs  . Financial resource strain: Not on file  . Food insecurity - worry: Not on file  . Food insecurity - inability: Not on file  . Transportation needs - medical: Not on file  . Transportation needs - non-medical: Not on file  Occupational History  . Not on file  Tobacco Use  . Smoking  status: Former Smoker    Packs/day: 2.00    Years: 20.00    Pack years: 40.00    Types: Cigarettes    Last attempt to quit: 06/28/1996    Years since quitting: 21.0  . Smokeless tobacco: Never Used  Substance and Sexual Activity  . Alcohol use: Yes    Alcohol/week: 0.0 oz    Comment: occasional  . Drug use: No  . Sexual activity: Not on file  Other Topics Concern  . Not on file  Social History Narrative  . Not on file    Family History  Problem Relation Age of Onset  . Cancer Mother   . Kidney failure Father   . Cancer Brother      Review  of Systems  Constitutional: Positive for chills and fever. Negative for malaise/fatigue.  HENT: Positive for sore throat. Negative for congestion, ear pain, nosebleeds and sinus pain.   Eyes: Negative.  Negative for discharge and redness.  Respiratory: Positive for cough and sputum production. Negative for hemoptysis, shortness of breath and wheezing.   Cardiovascular: Positive for chest pain (Pleuritic in nature). Negative for palpitations and leg swelling.  Gastrointestinal: Negative for abdominal pain, blood in stool, diarrhea, melena, nausea and vomiting.  Genitourinary: Negative.  Negative for dysuria and hematuria.  Musculoskeletal: Negative for back pain, myalgias and neck pain.  Skin: Negative.  Negative for rash.  Neurological: Negative for dizziness and headaches.  Endo/Heme/Allergies: Negative.   All other systems reviewed and are negative.   Vitals:   07/12/17 1154  BP: (!) 150/91  Pulse: (!) 107  Resp: 16  Temp: 98 F (36.7 C)  SpO2: 95%    Physical Exam  Constitutional: He is oriented to person, place, and time. He appears well-developed and well-nourished.  HENT:  Head: Normocephalic and atraumatic.  Nose: Nose normal.  Mouth/Throat: Posterior oropharyngeal erythema present. No oropharyngeal exudate or posterior oropharyngeal edema.  Eyes: Conjunctivae and EOM are normal. Pupils are equal, round, and reactive  to light.  Neck: Normal range of motion. Neck supple.  Cardiovascular: Normal rate, regular rhythm and normal heart sounds.  Pulmonary/Chest: Effort normal and breath sounds normal.  Abdominal: Soft. There is no tenderness.  Musculoskeletal: Normal range of motion.  Neurological: He is alert and oriented to person, place, and time. No sensory deficit. He exhibits normal muscle tone.  Skin: Skin is warm and dry. Capillary refill takes less than 2 seconds. No rash noted.  Psychiatric: He has a normal mood and affect. His behavior is normal.  Vitals reviewed.   A total of 25 minutes was spent in the room with the patient, greater than 50% of which was in counseling/coordination of care.  ASSESSMENT & PLAN: [place] was seen today for cough and chest congestion.  Diagnoses and all orders for this visit:  Lower respiratory infection -     azithromycin (ZITHROMAX) 250 MG tablet; Sig as indicated  Laryngitis, acute -     methylPREDNISolone (MEDROL DOSEPAK) 4 MG TBPK tablet; Sig as indicated  Cough -     promethazine-codeine (PHENERGAN WITH CODEINE) 6.25-10 MG/5ML syrup; Take 5 mLs by mouth at bedtime as needed for cough.  Pleurisy -     methylPREDNISolone (MEDROL DOSEPAK) 4 MG TBPK tablet; Sig as indicated    Patient Instructions       IF you received an x-ray today, you will receive an invoice from [facility]. Please contact [facility] at [phone] with questions or concerns regarding your invoice.   IF you received labwork today, you will receive an invoice from [facility]. Please contact [name] at [phone] with questions or concerns regarding your invoice.   Our billing staff will not be able to assist you with questions regarding bills from these companies.  You will be contacted with the lab results as soon as they are available. The fastest way to get your results is to activate your [name] account. Instructions are located on the last page of  this paperwork. If you have not heard from [name] regarding the results in 2 weeks, please contact this office.     Cough, Adult A cough helps to clear your throat and lungs. A cough may last only 2-3 weeks (acute), or it may last longer than 8  weeks (chronic). Many different things can cause a cough. A cough may be a sign of an illness or another medical condition. Follow these instructions at home:  Pay attention to any changes in your cough.  Take medicines only as told by your doctor. ? If you were prescribed an antibiotic medicine, take it as told by your doctor. Do not stop taking it even if you start to feel better. ? Talk with your doctor before you try using a cough medicine.  Drink enough fluid to keep your pee (urine) clear or pale yellow.  If the air is dry, use a cold steam vaporizer or humidifier in your home.  Stay away from things that make you cough at work or at home.  If your cough is worse at night, try using extra pillows to raise your head up higher while you sleep.  Do not smoke, and try not to be around smoke. If you need help quitting, ask your doctor.  Do not have caffeine.  Do not drink alcohol.  Rest as needed. Contact a doctor if:  You have new problems (symptoms).  You cough up yellow fluid (pus).  Your cough does not get better after 2-3 weeks, or your cough gets worse.  Medicine does not help your cough and you are not sleeping well.  You have pain that gets worse or pain that is not helped with medicine.  You have a fever.  You are losing weight and you do not know why.  You have night sweats. Get help right away if:  You cough up blood.  You have trouble breathing.  Your heartbeat is very fast. This information is not intended to replace advice given to you by your health care provider. Make sure you discuss any questions you have with your health care provider. Document Released: 01/14/2011 Document Revised: 10/09/2015 Document  Reviewed: 07/10/2014 Elsevier Interactive Patient Education  2018 Elsevier Inc.      Agustina Caroli, MD [facility]

## 2017-07-18 ENCOUNTER — Telehealth: Payer: Self-pay | Admitting: Emergency Medicine

## 2017-07-18 NOTE — Telephone Encounter (Signed)
Copied from CRM [id number]. 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 Had zpack and prednisone last week. Has the patient contacted their pharmacy? No. Preferred Pharmacy (with phone number or street name): [facility] [id number] - [place] - [address] [facility] [phone] (Phone) [phone] (Fax)

## 2017-07-18 NOTE — Telephone Encounter (Signed)
Patient is calling back to check on the status of this and if it will be done today. I have informed patient of the 24-72 hour process. Please advise.  [phone]

## 2017-07-19 NOTE — Telephone Encounter (Signed)
Needs Ov. Thanks.

## 2017-07-19 NOTE — Telephone Encounter (Signed)
Dr. Alvy BimlerSagardia,   Do you want this patient to be seen again?

## 2017-07-20 ENCOUNTER — Other Ambulatory Visit: Payer: Self-pay

## 2017-07-20 ENCOUNTER — Ambulatory Visit: Payer: BC Managed Care – PPO | Admitting: Emergency Medicine

## 2017-07-20 ENCOUNTER — Encounter: Payer: Self-pay | Admitting: Emergency Medicine

## 2017-07-20 VITALS — BP 158/90 | HR 108 | Temp 97.7°F | Resp 18 | Ht >= 80 in | Wt 370.2 lb

## 2017-07-20 DIAGNOSIS — E1142 Type 2 diabetes mellitus with diabetic polyneuropathy: Secondary | ICD-10-CM

## 2017-07-20 DIAGNOSIS — B349 Viral infection, unspecified: Secondary | ICD-10-CM

## 2017-07-20 DIAGNOSIS — J040 Acute laryngitis: Secondary | ICD-10-CM | POA: Diagnosis not present

## 2017-07-20 DIAGNOSIS — R0989 Other specified symptoms and signs involving the circulatory and respiratory systems: Secondary | ICD-10-CM

## 2017-07-20 LAB — POCT CBC
GRANULOCYTE PERCENT: 67.4 % (ref 37–80)
HEMATOCRIT: 47.2 % (ref 43.5–53.7)
Hemoglobin: 15.8 g/dL (ref 14.1–18.1)
LYMPH, POC: 2.9 (ref 0.6–3.4)
MCH, POC: 28.6 pg (ref 27–31.2)
MCHC: 33.4 g/dL (ref 31.8–35.4)
MCV: 85.7 fL (ref 80–97)
MID (cbc): 0.3 (ref 0–0.9)
MPV: 6.8 fL (ref 0–99.8)
POC GRANULOCYTE: 6.5 (ref 2–6.9)
POC LYMPH PERCENT: 29.9 %L (ref 10–50)
POC MID %: 2.7 % (ref 0–12)
Platelet Count, POC: 285 10*3/uL (ref 142–424)
RBC: 5.51 M/uL (ref 4.69–6.13)
RDW, POC: 14.7 %
WBC: 9.7 10*3/uL (ref 4.6–10.2)

## 2017-07-20 LAB — POCT GLYCOSYLATED HEMOGLOBIN (HGB A1C): Hemoglobin A1C: 11

## 2017-07-20 LAB — GLUCOSE, POCT (MANUAL RESULT ENTRY): POC Glucose: 278 mg/dl — AB (ref 70–99)

## 2017-07-20 MED ORDER — PREDNISONE 20 MG PO TABS: 40.0000 mg | ORAL_TABLET | Freq: Every day | ORAL | 0 refills | Status: AC

## 2017-07-20 MED ORDER — DOXYCYCLINE HYCLATE 100 MG PO TABS
100.0000 mg | ORAL_TABLET | Freq: Two times a day (BID) | ORAL | 0 refills | Status: DC
Start: 2017-07-20 — End: 2018-03-01

## 2017-07-20 NOTE — Telephone Encounter (Signed)
Left detailed message .  Dr. Alvy BimlerSagardia would like to see patient again.

## 2017-07-20 NOTE — Progress Notes (Signed)
Nathan Dean 62 y.o.   Chief Complaint  Patient presents with  . Chest Congestion    X f/u- still not better   . Fatigue    X f/u  . Fever    X f/u off and on    HISTORY OF PRESENT ILLNESS: This is a 62 y.o. male sick with flulike symptoms for 2 weeks.  Seen on 07/12/2017 started on Z-Pak, Medrol Dosepak, Phenergan with codeine, and Tessalon.  States he does not feel better.  Still with a low-grade fever, raspiness in his voice.  Throat feels like swallowing sandpaper.  Mild nonproductive cough.  Low energy and general malaise.  Voice still affected.  Pleuritic chest pain that he had a 1 week ago is gone.  No new symptoms just not better.  Had chest x-ray done last January and looked unremarkable.  HPI   Prior to Admission medications   Medication Sig Start Date End Date Taking? Authorizing Provider  acetaminophen (TYLENOL) 325 MG tablet Take 2 tablets (650 mg total) by mouth every 6 (six) hours as needed for mild pain or moderate pain. 12/28/16  Yes Milly Jakob, MD  albuterol (PROVENTIL HFA;VENTOLIN HFA) 108 (90 BASE) MCG/ACT inhaler Inhale 2 puffs into the lungs every 6 (six) hours as needed for wheezing or shortness of breath. 04/15/15  Yes Arlis Porta., MD  amLODipine (NORVASC) 10 MG tablet Take 0.5 tablets (5 mg total) by mouth daily. 08/11/16  Yes Karamalegos, Devonne Doughty, DO  aspirin 81 MG tablet Take 1 tablet (81 mg total) by mouth daily. 08/11/16  Yes Karamalegos, Devonne Doughty, DO  azithromycin (ZITHROMAX) 250 MG tablet Sig as indicated 07/12/17  Yes Annalisse Minkoff, Ines Bloomer, MD  benzonatate (TESSALON) 100 MG capsule Take 1-2 capsules (100-200 mg total) by mouth 3 (three) times daily as needed for cough. 07/05/17  Yes Rutherford Guys, MD  Blood Glucose Monitoring Suppl (ONE TOUCH ULTRA SYSTEM KIT) w/Device KIT 1 kit by Does not apply route once. Dx. E11.9 07/28/15  Yes Arlis Porta., MD  carvedilol (COREG) 6.25 MG tablet Take 1 tablet (6.25 mg total) by mouth 2 (two)  times daily with a meal. 01/05/17  Yes Wendie Agreste, MD  Cholecalciferol (VITAMIN D3) 5000 UNITS CAPS Take 10,000 Units by mouth daily.    Yes [provider]  cyclobenzaprine (FLEXERIL) 5 MG tablet Take 1 tablet (5 mg total) by mouth 3 (three) times daily as needed. Start with one pill by mouth each bedtime as needed due to sedation 05/31/17  Yes Wendie Agreste, MD  Dulaglutide (TRULICITY) 1.5 EH/6.3JS SOPN Inject 1.5 mg into the skin once a week. 01/05/17  Yes Wendie Agreste, MD  escitalopram (LEXAPRO) 10 MG tablet TAKE 1 TABLET(10 MG) BY MOUTH DAILY 07/05/17  Yes Wendie Agreste, MD  glucose blood test strip 1 each by Other route 4 (four) times daily. Use with One touch meter to check blood sugar. Dx E11.9 09/13/16  Yes Mikey College, NP  hydrochlorothiazide (HYDRODIURIL) 25 MG tablet Take 1 tablet (25 mg total) by mouth daily. 02/04/17  Yes Wendie Agreste, MD  Insulin Pen Needle (BD PEN NEEDLE NANO U/F) 32G X 4 MM MISC Inject 1 each as directed 3 (three) times daily. 01/07/17  Yes Wendie Agreste, MD  LEVEMIR FLEXTOUCH 100 UNIT/ML Pen inject 54 units subcutaneously twice a day 01/05/17  Yes Wendie Agreste, MD  losartan (COZAAR) 100 MG tablet Take 1 tablet (100 mg total) by mouth daily.  01/05/17  Yes Wendie Agreste, MD  metFORMIN (GLUCOPHAGE) 500 MG tablet Take 2 tablets (1,000 mg total) by mouth 2 (two) times daily. 08/16/16  Yes Karamalegos, Devonne Doughty, DO  methylPREDNISolone (MEDROL DOSEPAK) 4 MG TBPK tablet Sig as indicated 07/12/17  Yes Tomoya Ringwald, Ines Bloomer, MD  omeprazole (PRILOSEC) 40 MG capsule Take 40 mg by mouth daily.   Yes [provider]  ONE TOUCH LANCETS MISC 1 each by Does not apply route 4 (four) times daily. Use with one touch meter to check blood sugar. Dx: E11.9 09/16/16  Yes Mikey College, NP  oxyCODONE (ROXICODONE) 5 MG immediate release tablet Take 1 tablet (5 mg total) by mouth every 6 (six) hours as needed for breakthrough pain.  12/28/16  Yes Milly Jakob, MD  pravastatin (PRAVACHOL) 20 MG tablet TAKE 1 TABLET BY MOUTH ONCE DAILY 03/25/17  Yes Wendie Agreste, MD  promethazine-codeine [facility] WITH CODEINE) 6.25-10 MG/5ML syrup Take 5 mLs by mouth at bedtime as needed for cough. 07/12/17  Yes Taleisha Kaczynski, Ines Bloomer, MD  Pseudoephedrine-Guaifenesin [facility] D PO) Take by mouth 2 (two) times daily.   Yes [provider]  vitamin B-12 (CYANOCOBALAMIN) 1000 MCG tablet Take 1,000 mcg by mouth daily.   Yes [provider]  gabapentin (NEURONTIN) 300 MG capsule Take 1-2 capsules (300-600 mg total) by mouth 3 (three) times daily. 01/05/17 03/29/17  Wendie Agreste, MD    Allergies  Allergen Reactions  . Penicillins Anaphylaxis, Rash and Other (See Comments)  . Succinylcholine Chloride Anaphylaxis and Rash  . Keflex [Cephalexin] Rash  . Sulfa Antibiotics Rash and Other (See Comments)    Patient Active Problem List   Diagnosis Date Noted  . Lower respiratory infection 07/12/2017  . Laryngitis, acute 07/12/2017  . Cough 07/12/2017  . Pleurisy 07/12/2017  . Depression 06/03/2015  . HTN (hypertension) 01/02/2015  . Hyperlipidemia 01/02/2015  . 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 Sleep apnea 09/21/2010  . Arthritis, degenerative 02/17/2009  . Hereditary and idiopathic peripheral neuropathy 11/21/2008  . AD (atopic dermatitis) 07/08/2008  . Acid reflux 04/05/2007    Past Medical History:  Diagnosis Date  . Diabetes ([place])   . GERD (gastroesophageal reflux disease)   . Hyperlipidemia   . Hypertension   . Sleep apnea     Past Surgical History:  Procedure Laterality Date  . CARPAL TUNNEL RELEASE Right 12/28/2016   Procedure: RIGHT ULNAR AND MEDIAN NEUROPLASTY AT WRIST;  Surgeon: Milly Jakob, MD;  Location: [facility];  Service: Orthopedics;  Laterality: Right;  . REPLACEMENT TOTAL KNEE BILATERAL  2014   left 2012 rt 2014     Social History   Socioeconomic History  . Marital status: Married    Spouse name: Not on file  . Number of children: 0  . Years of education: Not on file  . Highest education level: Not on file  Social Needs  . Financial resource strain: Not on file  . Food insecurity - worry: Not on file  . Food insecurity - inability: Not on file  . Transportation needs - medical: Not on file  . Transportation needs - non-medical: Not on file  Occupational History  . Not on file  Tobacco Use  . Smoking status: Former Smoker    Packs/day: 2.00    Years: 20.00    Pack years: 40.00    Types: Cigarettes    Last attempt to quit: 06/28/1996  Years since quitting: 21.0  . Smokeless tobacco: Never Used  Substance and Sexual Activity  . Alcohol use: Yes    Alcohol/week: 0.0 oz    Comment: occasional  . Drug use: No  . Sexual activity: Not on file  Other Topics Concern  . Not on file  Social History Narrative  . Not on file    Family History  Problem Relation Age of Onset  . Cancer Mother   . Kidney failure Father   . Cancer Brother      Review of Systems  Constitutional: Positive for fever and malaise/fatigue.  HENT: Positive for congestion and sore throat. Negative for ear pain and nosebleeds.   Eyes: Negative for discharge and redness.  Respiratory: Positive for cough. Negative for hemoptysis, shortness of breath and wheezing.   Cardiovascular: Negative for chest pain, palpitations and leg swelling.  Gastrointestinal: Negative.  Negative for abdominal pain, blood in stool, diarrhea, melena, nausea and vomiting.  Genitourinary: Negative.  Negative for dysuria and hematuria.  Musculoskeletal: Negative for back pain, myalgias and neck pain.  Skin: Negative for rash.  Neurological: Positive for weakness. Negative for dizziness and headaches.  Endo/Heme/Allergies: Negative.     Vitals:   07/20/17 1528  BP: (!) 158/90  Pulse: (!) 108  Resp: 18  Temp: 97.7 F (36.5 C)   SpO2: 96%    Physical Exam  Constitutional: He is oriented to person, place, and time. He appears well-developed and well-nourished.  HENT:  Head: Normocephalic and atraumatic.  Nose: Nose normal.  Mouth/Throat: Oropharynx is clear and moist.  Eyes: Conjunctivae and EOM are normal. Pupils are equal, round, and reactive to light.  Neck: Normal range of motion. Neck supple.  Cardiovascular: Normal rate, regular rhythm and normal heart sounds.  Pulmonary/Chest: Effort normal and breath sounds normal.  Abdominal: Soft. There is no tenderness.  Musculoskeletal: Normal range of motion.  Neurological: He is alert and oriented to person, place, and time. No sensory deficit. He exhibits normal muscle tone.  Skin: Capillary refill takes less than 2 seconds.  Psychiatric: He has a normal mood and affect. His behavior is normal.  Vitals reviewed.   EKG: Normal sinus rhythm with no acute ischemic changes.  Normal intervals.  Ventricular rate 91/min.  Compared with EKG from 12/27/2016, no changes.  Results for orders placed or performed in visit on 07/20/17 (from the past 24 hour(s))  POCT CBC     Status: None   Collection Time: 07/20/17  4:03 PM  Result Value Ref Range   WBC 9.7 4.6 - 10.2 K/uL   Lymph, poc 2.9 0.6 - 3.4   POC LYMPH PERCENT 29.9 10 - 50 %L   MID (cbc) 0.3 0 - 0.9   POC MID % 2.7 0 - 12 %M   POC Granulocyte 6.5 2 - 6.9   Granulocyte percent 67.4 37 - 80 %G   RBC 5.51 4.69 - 6.13 M/uL   Hemoglobin 15.8 14.1 - 18.1 g/dL   HCT, POC 47.2 43.5 - 53.7 %   MCV 85.7 80 - 97 fL   MCH, POC 28.6 27 - 31.2 pg   MCHC 33.4 31.8 - 35.4 g/dL   RDW, POC 14.7 %   Platelet Count, POC 285 142 - 424 K/uL   MPV 6.8 0 - 99.8 fL  POCT glucose (manual entry)     Status: Abnormal   Collection Time: 07/20/17  4:08 PM  Result Value Ref Range   POC Glucose 278 (A) 70 - 99  mg/dl  POCT glycosylated hemoglobin (Hb A1C)     Status: None   Collection Time: 07/20/17  4:13 PM  Result Value Ref Range    Hemoglobin A1C 11.0      ASSESSMENT & PLAN: Nathan Dean was seen today for chest congestion, fatigue and fever.  Diagnoses and all orders for this visit:  Chest congestion -     EKG 12-Lead -     doxycycline (VIBRA-TABS) 100 MG tablet; Take 1 tablet (100 mg total) by mouth 2 (two) times daily. -     predniSONE (DELTASONE) 20 MG tablet; Take 2 tablets (40 mg total) by mouth daily with breakfast for 5 days.  Viral illness -     POCT CBC -     Comprehensive metabolic panel -     doxycycline (VIBRA-TABS) 100 MG tablet; Take 1 tablet (100 mg total) by mouth 2 (two) times daily. -     predniSONE (DELTASONE) 20 MG tablet; Take 2 tablets (40 mg total) by mouth daily with breakfast for 5 days.  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 Please contact [facility] at [phone] with questions or concerns regarding your invoice.   IF you received labwork today, you will receive an invoice from [facility]. Please contact [name] at [phone] with questions or concerns regarding your invoice.   Our billing staff will not be able to assist you with questions regarding bills from these companies.  You will be contacted with the lab results as soon as they are available. The fastest way to get your results is to activate your [name] account. Instructions are located on the last page of this paperwork. If you have not heard from [name] regarding the results in 2 weeks, please contact this office.     Viral Illness, Adult Viruses are tiny germs that can get into a person's body and cause illness. There are many different types of viruses, and they cause many types of illness. Viral illnesses can range  from mild to severe. They can affect various parts of the body. Common illnesses that are caused by a virus include colds and the flu. Viral illnesses also include serious conditions such as HIV/AIDS (human immunodeficiency virus/acquired immunodeficiency syndrome). A few viruses have been linked to certain cancers. What are the causes? Many types of viruses can cause illness. Viruses invade cells in your body, multiply, and cause the infected cells to malfunction or die. When the cell dies, it releases more of the virus. When this happens, you develop symptoms of the illness, and the virus continues to spread to other cells. If the virus takes over the function of the cell, it can cause the cell to divide and grow out of control, as is the case when a virus causes cancer. Different viruses get into the body in different ways. You can get a virus by:  Swallowing food or water that is contaminated with the virus.  Breathing in droplets that have been coughed or sneezed into the air by an infected person.  Touching a surface that has been contaminated with the virus and then touching your eyes, nose, or mouth.  Being bitten by an insect or animal  that carries the virus.  Having sexual contact with a person who is infected with the virus.  Being exposed to blood or fluids that contain the virus, either through an open cut or during a transfusion.  If a virus enters your body, your body's defense system (immune system) will try to fight the virus. You may be at higher risk for a viral illness if your immune system is weak. What are the signs or symptoms? Symptoms vary depending on the type of virus and the location of the cells that it invades. Common symptoms of the main types of viral illnesses include: Cold and flu viruses  Fever.  Headache.  Sore throat.  Muscle aches.  Nasal congestion.  Cough. Digestive system (gastrointestinal) viruses  Fever.  Abdominal  pain.  Nausea.  Diarrhea. Liver viruses (hepatitis)  Loss of appetite.  Tiredness.  Yellowing of the skin (jaundice). Brain and spinal cord viruses  Fever.  Headache.  Stiff neck.  Nausea and vomiting.  Confusion or sleepiness. Skin viruses  Warts.  Itching.  Rash. Sexually transmitted viruses  Discharge.  Swelling.  Redness.  Rash. How is this treated? Viruses can be difficult to treat because they live within cells. Antibiotic medicines do not treat viruses because these drugs do not get inside cells. Treatment for a viral illness may include:  Resting and drinking plenty of fluids.  Medicines to relieve symptoms. These can include over-the-counter medicine for pain and fever, medicines for cough or congestion, and medicines to relieve diarrhea.  Antiviral medicines. These drugs are available only for certain types of viruses. They may help reduce flu symptoms if taken early. There are also many antiviral medicines for hepatitis and HIV/AIDS.  Some viral illnesses can be prevented with vaccinations. A common example is the flu shot. Follow these instructions at home: Medicines   Take over-the-counter and prescription medicines only as told by your health care provider.  If you were prescribed an antiviral medicine, take it as told by your health care provider. Do not stop taking the medicine even if you start to feel better.  Be aware of when antibiotics are needed and when they are not needed. Antibiotics do not treat viruses. If your health care provider thinks that you may have a bacterial infection as well as a viral infection, you may get an antibiotic. ? Do not ask for an antibiotic prescription if you have been diagnosed with a viral illness. That will not make your illness go away faster. ? Frequently taking antibiotics when they are not needed can lead to antibiotic resistance. When this develops, the medicine no longer works against the bacteria  that it normally fights. General instructions  Drink enough fluids to keep your urine clear or pale yellow.  Rest as much as possible.  Return to your normal activities as told by your health care provider. Ask your health care provider what activities are safe for you.  Keep all follow-up visits as told by your health care provider. This is important. How is this prevented? Take these actions to reduce your risk of viral infection:  Eat a healthy diet and get enough rest.  Wash your hands often with soap and water. This is especially important when you are in public places. If soap and water are not available, use hand sanitizer.  Avoid close contact with friends and family who have a viral illness.  If you travel to areas where viral gastrointestinal infection is common, avoid drinking water or eating raw food.  Keep your immunizations up to date. Get a flu shot every year as told by your health care provider.  Do not share toothbrushes, nail clippers, razors, or needles with other people.  Always practice safe sex.  Contact a health care provider if:  You have symptoms of a viral illness that do not go away.  Your symptoms come back after going away.  Your symptoms get worse. Get help right away if:  You have trouble breathing.  You have a severe headache or a stiff neck.  You have severe vomiting or abdominal pain. This information is not intended to replace advice given to you by your health care provider. Make sure you discuss any questions you have with your health care provider. Document Released: 09/12/2015 Document Revised: 10/15/2015 Document Reviewed: 09/12/2015 Elsevier Interactive Patient Education  2018 [employer]      Agustina Caroli, MD [facility]

## 2017-07-20 NOTE — Patient Instructions (Addendum)
   IF you received an x-ray today, you will receive an invoice from [facility]. Please contact [facility] at [phone] with questions or concerns regarding your invoice.   IF you received labwork today, you will receive an invoice from [name]. Please contact [name] at [phone] with questions or concerns regarding your invoice.   Our billing staff will not be able to assist you with questions regarding bills from these companies.  You will be contacted with the lab results as soon as they are available. The fastest way to get your results is to activate your [name] account. Instructions are located on the last page of this paperwork. If you have not heard from us regarding the results in 2 weeks, please contact this office.     Viral Illness, Adult Viruses are tiny germs that can get into a person's body and cause illness. There are many different types of viruses, and they cause many types of illness. Viral illnesses can range from mild to severe. They can affect various parts of the body. Common illnesses that are caused by a virus include colds and the flu. Viral illnesses also include serious conditions such as HIV/AIDS (human immunodeficiency virus/acquired immunodeficiency syndrome). A few viruses have been linked to certain cancers. What are the causes? Many types of viruses can cause illness. Viruses invade cells in your body, multiply, and cause the infected cells to malfunction or die. When the cell dies, it releases more of the virus. When this happens, you develop symptoms of the illness, and the virus continues to spread to other cells. If the virus takes over the function of the cell, it can cause the cell to divide and grow out of control, as is the case when a virus causes cancer. Different viruses get into the body in different ways. You can get a virus by:  Swallowing food or water that is contaminated with the virus.  Breathing in droplets  that have been coughed or sneezed into the air by an infected person.  Touching a surface that has been contaminated with the virus and then touching your eyes, nose, or mouth.  Being bitten by an insect or animal that carries the virus.  Having sexual contact with a person who is infected with the virus.  Being exposed to blood or fluids that contain the virus, either through an open cut or during a transfusion.  If a virus enters your body, your body's defense system (immune system) will try to fight the virus. You may be at higher risk for a viral illness if your immune system is weak. What are the signs or symptoms? Symptoms vary depending on the type of virus and the location of the cells that it invades. Common symptoms of the main types of viral illnesses include: Cold and flu viruses  Fever.  Headache.  Sore throat.  Muscle aches.  Nasal congestion.  Cough. Digestive system (gastrointestinal) viruses  Fever.  Abdominal pain.  Nausea.  Diarrhea. Liver viruses (hepatitis)  Loss of appetite.  Tiredness.  Yellowing of the skin (jaundice). Brain and spinal cord viruses  Fever.  Headache.  Stiff neck.  Nausea and vomiting.  Confusion or sleepiness. Skin viruses  Warts.  Itching.  Rash. Sexually transmitted viruses  Discharge.  Swelling.  Redness.  Rash. How is this treated? Viruses can be difficult to treat because they live within cells. Antibiotic medicines do not treat viruses because these drugs do not get inside cells. Treatment for a viral illness   may include:  Resting and drinking plenty of fluids.  Medicines to relieve symptoms. These can include over-the-counter medicine for pain and fever, medicines for cough or congestion, and medicines to relieve diarrhea.  Antiviral medicines. These drugs are available only for certain types of viruses. They may help reduce flu symptoms if taken early. There are also many antiviral medicines  for hepatitis and HIV/AIDS.  Some viral illnesses can be prevented with vaccinations. A common example is the flu shot. Follow these instructions at home: Medicines   Take over-the-counter and prescription medicines only as told by your health care provider.  If you were prescribed an antiviral medicine, take it as told by your health care provider. Do not stop taking the medicine even if you start to feel better.  Be aware of when antibiotics are needed and when they are not needed. Antibiotics do not treat viruses. If your health care provider thinks that you may have a bacterial infection as well as a viral infection, you may get an antibiotic. ? Do not ask for an antibiotic prescription if you have been diagnosed with a viral illness. That will not make your illness go away faster. ? Frequently taking antibiotics when they are not needed can lead to antibiotic resistance. When this develops, the medicine no longer works against the bacteria that it normally fights. General instructions  Drink enough fluids to keep your urine clear or pale yellow.  Rest as much as possible.  Return to your normal activities as told by your health care provider. Ask your health care provider what activities are safe for you.  Keep all follow-up visits as told by your health care provider. This is important. How is this prevented? Take these actions to reduce your risk of viral infection:  Eat a healthy diet and get enough rest.  Wash your hands often with soap and water. This is especially important when you are in public places. If soap and water are not available, use hand sanitizer.  Avoid close contact with friends and family who have a viral illness.  If you travel to areas where viral gastrointestinal infection is common, avoid drinking water or eating raw food.  Keep your immunizations up to date. Get a flu shot every year as told by your health care provider.  Do not share toothbrushes,  nail clippers, razors, or needles with other people.  Always practice safe sex.  Contact a health care provider if:  You have symptoms of a viral illness that do not go away.  Your symptoms come back after going away.  Your symptoms get worse. Get help right away if:  You have trouble breathing.  You have a severe headache or a stiff neck.  You have severe vomiting or abdominal pain. This information is not intended to replace advice given to you by your health care provider. Make sure you discuss any questions you have with your health care provider. Document Released: 09/12/2015 Document Revised: 10/15/2015 Document Reviewed: 09/12/2015 Elsevier Interactive Patient Education  2018 [employer]  

## 2017-07-21 LAB — COMPREHENSIVE METABOLIC PANEL
ALBUMIN: 4.3 g/dL (ref 3.6–4.8)
ALT: 38 IU/L (ref 0–44)
AST: 35 IU/L (ref 0–40)
Albumin/Globulin Ratio: 1.6 (ref 1.2–2.2)
Alkaline Phosphatase: 78 IU/L (ref 39–117)
BILIRUBIN TOTAL: 0.3 mg/dL (ref 0.0–1.2)
BUN/Creatinine Ratio: 28 — ABNORMAL HIGH (ref 10–24)
BUN: 24 mg/dL (ref 8–27)
CALCIUM: 9.7 mg/dL (ref 8.6–10.2)
CHLORIDE: 99 mmol/L (ref 96–106)
CO2: 28 mmol/L (ref 20–29)
CREATININE: 0.87 mg/dL (ref 0.76–1.27)
GFR calc non Af Amer: 92 mL/min/{1.73_m2} (ref >59)
GFR, EST AFRICAN AMERICAN: 106 mL/min/{1.73_m2} (ref >59)
GLUCOSE: 275 mg/dL — AB (ref 65–99)
Globulin, Total: 2.7 g/dL (ref 1.5–4.5)
Potassium: 4.2 mmol/L (ref 3.5–5.2)
Sodium: 140 mmol/L (ref 134–144)
TOTAL PROTEIN: 7 g/dL (ref 6.0–8.5)

## 2017-07-26 ENCOUNTER — Telehealth: Payer: Self-pay | Admitting: Family Medicine

## 2017-07-26 NOTE — Telephone Encounter (Signed)
Pt given results per notes of 07/20/17 on lab work.Unable to document in result note due to result note not being routed to [facility]. Pt. Verbalizes understanding.

## 2017-09-06 ENCOUNTER — Encounter: Payer: Self-pay | Admitting: Emergency Medicine

## 2017-09-06 ENCOUNTER — Ambulatory Visit: Payer: 59 | Admitting: Emergency Medicine

## 2017-09-06 ENCOUNTER — Other Ambulatory Visit: Payer: Self-pay

## 2017-09-06 VITALS — BP 136/86 | HR 103 | Temp 98.5°F | Resp 16 | Ht >= 80 in | Wt 377.2 lb

## 2017-09-06 DIAGNOSIS — E118 Type 2 diabetes mellitus with unspecified complications: Secondary | ICD-10-CM

## 2017-09-06 DIAGNOSIS — E1142 Type 2 diabetes mellitus with diabetic polyneuropathy: Secondary | ICD-10-CM | POA: Diagnosis not present

## 2017-09-06 MED ORDER — METFORMIN HCL 500 MG PO TABS: 1000.0000 mg | ORAL_TABLET | Freq: Two times a day (BID) | ORAL | 3 refills | Status: DC

## 2017-09-06 NOTE — Patient Instructions (Addendum)
   IF you received an x-ray today, you will receive an invoice from [facility]. Please contact [facility] at [phone] with questions or concerns regarding your invoice.   IF you received labwork today, you will receive an invoice from [name]. Please contact [name] at [phone] with questions or concerns regarding your invoice.   Our billing staff will not be able to assist you with questions regarding bills from these companies.  You will be contacted with the lab results as soon as they are available. The fastest way to get your results is to activate your [name] account. Instructions are located on the last page of this paperwork. If you have not heard from us regarding the results in 2 weeks, please contact this office.      Diabetic Neuropathy Diabetic neuropathy is a nerve disease or nerve damage that is caused by diabetes mellitus. About half of all people with diabetes mellitus have some form of nerve damage. Nerve damage is more common in those who have had diabetes mellitus for many years and who generally have not had good control of their blood sugar (glucose) level. Diabetic neuropathy is a common complication of diabetes mellitus. There are three common types of diabetic neuropathy and a fourth type that is less common and less understood:  Peripheral neuropathy-This is the most common type of diabetic neuropathy. It causes damage to the nerves of the feet and legs first and then eventually the hands and arms. The damage affects the ability to sense touch.  Autonomic neuropathy-This type causes damage to the autonomic nervous system, which controls the following functions: ? Heartbeat. ? Body temperature. ? Blood pressure. ? Urination. ? Digestion. ? Sweating. ? Sexual function.  Focal neuropathy-Focal neuropathy can be painful and unpredictable and occurs most often in older adults with diabetes mellitus. It involves a specific nerve or one  area and often comes on suddenly. It usually does not cause long-term problems.  Radiculoplexus neuropathy- Sometimes called lumbosacral radiculoplexus neuropathy, radiculoplexus neuropathy affects the nerves of the thighs, hips, buttocks, or legs. It is more common in people with type 2 diabetes mellitus and in older men. It is characterized by debilitating pain, weakness, and atrophy, usually in the thigh muscles.  What are the causes? The cause of peripheral, autonomic, and focal neuropathies is diabetes mellitus that is uncontrolled and high glucose levels. The cause of radiculoplexus neuropathy is unknown. However, it is thought to be caused by inflammation related to uncontrolled glucose levels. What are the signs or symptoms? Peripheral Neuropathy Peripheral neuropathy develops slowly over time. When the nerves of the feet and legs no longer work there may be:  Burning, stabbing, or aching pain in the legs or feet.  Inability to feel pressure or pain in your feet. This can lead to: ? Thick calluses over pressure areas. ? Pressure sores. ? Ulcers.  Foot deformities.  Reduced ability to feel temperature changes.  Muscle weakness.  Autonomic Neuropathy The symptoms of autonomic neuropathy vary depending on which nerves are affected. Symptoms may include:  Problems with digestion, such as: ? Feeling sick to your stomach (nausea). ? Vomiting. ? Bloating. ? Constipation. ? Diarrhea. ? Abdominal pain.  Difficulty with urination. This occurs if you lose your ability to sense when your bladder is full. Problems include: ? Urine leakage (incontinence). ? Inability to empty your bladder completely (retention).  Rapid or irregular heartbeat (palpitations).  Blood pressure drops when you stand up (orthostatic hypotension). When you stand up you   may feel: ? Dizzy. ? Weak. ? Faint.  In men, inability to attain and maintain an erection.  In women, vaginal dryness and problems  with decreased sexual desire and arousal.  Problems with body temperature regulation.  Increased or decreased sweating.  Focal Neuropathy  Abnormal eye movements or abnormal alignment of both eyes.  Weakness in the wrist.  Foot drop. This results in an inability to lift the foot properly and abnormal walking or foot movement.  Paralysis on one side of your face (Bell palsy).  Chest or abdominal pain. Radiculoplexus Neuropathy  Sudden, severe pain in your hip, thigh, or buttocks.  Weakness and wasting of thigh muscles.  Difficulty rising from a seated position.  Abdominal swelling.  Unexplained weight loss (usually more than 10 lb [4.5 kg]). How is this diagnosed? Peripheral Neuropathy Your senses may be tested. Sensory function testing can be done with:  A light touch using a monofilament.  A vibration with tuning fork.  A sharp sensation with a pin prick.  Other tests that can help diagnose neuropathy are:  Nerve conduction velocity. This test checks the transmission of an electrical current through a nerve.  Electromyography. This shows how muscles respond to electrical signals transmitted by nearby nerves.  Quantitative sensory testing. This is used to assess how your nerves respond to vibrations and changes in temperature.  Autonomic Neuropathy Diagnosis is often based on reported symptoms. Tell your health care provider if you experience:  Dizziness.  Constipation.  Diarrhea.  Inappropriate urination or inability to urinate.  Inability to get or maintain an erection.  Tests that may be done include:  Electrocardiography or Holter monitor. These are tests that can help show problems with the heart rate or heart rhythm.  An X-ray exam may be done.  Focal Neuropathy Diagnosis is made based on your symptoms and what your health care provider finds during your exam. Other tests may be done. They may include:  Nerve conduction velocities. This checks  the transmission of electrical current through a nerve.  Electromyography. This shows how muscles respond to electrical signals transmitted by nearby nerves.  Quantitative sensory testing. This test is used to assess how your nerves respond to vibration and changes in temperature.  Radiculoplexus Neuropathy  Often the first thing is to eliminate any other issue or problems that might be the cause, as there is no standard test for diagnosis.  X-ray exam of your spine and lumbar region.  Spinal tap to rule out cancer.  MRI to rule out other lesions. How is this treated? Once nerve damage occurs, it cannot be reversed. The goal of treatment is to keep the disease or nerve damage from getting worse and affecting more nerve fibers. Controlling your blood glucose level is the key. Most people with radiculoplexus neuropathy see at least a partial improvement over time. You will need to keep your blood glucose and HbA1c levels in the target range determined by your health care provider. Things that help control blood glucose levels include:  Blood glucose monitoring.  Meal planning.  Physical activity.  Diabetes medicine.  Over time, maintaining lower blood glucose levels helps lessen symptoms. Sometimes, prescription pain medicine is needed. Follow these instructions at home:  Do not smoke.  Keep your blood glucose level in the range that you and your health care provider have determined acceptable for you.  Keep your blood pressure level in the range that you and your health care provider have determined acceptable for you.  Eat a   well-balanced diet.  Be physically active every day. Include strength training and balance exercises.  Protect your feet. ? Check your feet every day for sores, cuts, blisters, or signs of infection. ? Wear padded socks and supportive shoes. Use orthotic inserts, if necessary. ? Regularly check the insides of your shoes for worn spots. Make sure there  are no rocks or other items inside your shoes before you put them on. Contact a health care provider if:  You have burning, stabbing, or aching pain in the legs or feet.  You are unable to feel pressure or pain in your feet.  You develop problems with digestion such as: ? Nausea. ? Vomiting. ? Bloating. ? Constipation. ? Diarrhea. ? Abdominal pain.  You have difficulty with urination, such as: ? Incontinence. ? Retention.  You have palpitations.  You develop orthostatic hypotension. When you stand up you may feel: ? Dizzy. ? Weak. ? Faint.  You cannot attain and maintain an erection (in men).  You have vaginal dryness and problems with decreased sexual desire and arousal (in women).  You have severe pain in your thighs, legs, or buttocks.  You have unexplained weight loss. This information is not intended to replace advice given to you by your health care provider. Make sure you discuss any questions you have with your health care provider. Document Released: 07/12/2001 Document Revised: 10/09/2015 Document Reviewed: 10/12/2012 Elsevier Interactive Patient Education  2017 [employer]  

## 2017-09-06 NOTE — Progress Notes (Signed)
Nathan Dean 62 y.o.   Chief Complaint  Patient presents with  . Numbness    both feet x 1 year, Hx: diabetic  . Medication Refill    Metformin HCL    HISTORY OF PRESENT ILLNESS: This is a 62 y.o. male diabetic, hypertensive, history of high cholesterol, needs a refill of metformin.  Has had feet numbness for the past year at least.  Has been seeing Dr. Carlota Raspberry for a while but he wants to transition care to another physician.  HPI   Prior to Admission medications   Medication Sig Start Date End Date Taking? Authorizing Provider  amLODipine (NORVASC) 10 MG tablet Take 0.5 tablets (5 mg total) by mouth daily. 08/11/16  Yes Karamalegos, Devonne Doughty, DO  aspirin 81 MG tablet Take 1 tablet (81 mg total) by mouth daily. 08/11/16  Yes Karamalegos, Devonne Doughty, DO  carvedilol (COREG) 6.25 MG tablet Take 1 tablet (6.25 mg total) by mouth 2 (two) times daily with a meal. 01/05/17  Yes Wendie Agreste, MD  Cholecalciferol (VITAMIN D3) 5000 UNITS CAPS Take 10,000 Units by mouth daily.    Yes [provider]  Dulaglutide (TRULICITY) 1.5 QQ/2.2LN SOPN Inject 1.5 mg into the skin once a week. 01/05/17  Yes Wendie Agreste, MD  escitalopram (LEXAPRO) 10 MG tablet TAKE 1 TABLET(10 MG) BY MOUTH DAILY 07/05/17  Yes Wendie Agreste, MD  hydrochlorothiazide (HYDRODIURIL) 25 MG tablet Take 1 tablet (25 mg total) by mouth daily. 02/04/17  Yes Wendie Agreste, MD  LEVEMIR FLEXTOUCH 100 UNIT/ML Pen inject 54 units subcutaneously twice a day 01/05/17  Yes Wendie Agreste, MD  losartan (COZAAR) 100 MG tablet Take 1 tablet (100 mg total) by mouth daily. 01/05/17  Yes Wendie Agreste, MD  metFORMIN (GLUCOPHAGE) 500 MG tablet Take 2 tablets (1,000 mg total) by mouth 2 (two) times daily. 08/16/16  Yes Karamalegos, Devonne Doughty, DO  omeprazole (PRILOSEC) 40 MG capsule Take 40 mg by mouth daily.   Yes [provider]  pravastatin (PRAVACHOL) 20 MG tablet TAKE 1 TABLET BY MOUTH ONCE DAILY 03/25/17   Yes Wendie Agreste, MD  acetaminophen (TYLENOL) 325 MG tablet Take 2 tablets (650 mg total) by mouth every 6 (six) hours as needed for mild pain or moderate pain. Patient not taking: Reported on 09/06/2017 12/28/16   Milly Jakob, MD  albuterol (PROVENTIL HFA;VENTOLIN HFA) 108 (90 BASE) MCG/ACT inhaler Inhale 2 puffs into the lungs every 6 (six) hours as needed for wheezing or shortness of breath. Patient not taking: Reported on 09/06/2017 04/15/15   Arlis Porta., MD  Blood Glucose Monitoring Suppl (ONE TOUCH ULTRA SYSTEM KIT) w/Device KIT 1 kit by Does not apply route once. Dx. E11.9 07/28/15   Arlis Porta., MD  cyclobenzaprine (FLEXERIL) 5 MG tablet Take 1 tablet (5 mg total) by mouth 3 (three) times daily as needed. Start with one pill by mouth each bedtime as needed due to sedation Patient not taking: Reported on 09/06/2017 05/31/17   Wendie Agreste, MD  doxycycline (VIBRA-TABS) 100 MG tablet Take 1 tablet (100 mg total) by mouth 2 (two) times daily. Patient not taking: Reported on 09/06/2017 07/20/17   Horald Pollen, MD  gabapentin (NEURONTIN) 300 MG capsule Take 1-2 capsules (300-600 mg total) by mouth 3 (three) times daily. 01/05/17 03/29/17  Wendie Agreste, MD  glucose blood test strip 1 each by Other route 4 (four) times daily. Use with One touch meter to check  blood sugar. Dx E11.9 09/13/16   Mikey College, NP  Insulin Pen Needle (BD PEN NEEDLE NANO U/F) 32G X 4 MM MISC Inject 1 each as directed 3 (three) times daily. 01/07/17   Wendie Agreste, MD  methylPREDNISolone (MEDROL DOSEPAK) 4 MG TBPK tablet Sig as indicated Patient not taking: Reported on 09/06/2017 07/12/17   Horald Pollen, MD  ONE TOUCH LANCETS MISC 1 each by Does not apply route 4 (four) times daily. Use with one touch meter to check blood sugar. Dx: E11.9 09/16/16   Mikey College, NP  oxyCODONE (ROXICODONE) 5 MG immediate release tablet Take 1 tablet (5 mg total) by mouth every 6  (six) hours as needed for breakthrough pain. Patient not taking: Reported on 09/06/2017 12/28/16   Milly Jakob, MD  vitamin B-12 (CYANOCOBALAMIN) 1000 MCG tablet Take 1,000 mcg by mouth daily.    [provider]    Allergies  Allergen Reactions  . Penicillins Anaphylaxis, Rash and Other (See Comments)  . Succinylcholine Chloride Anaphylaxis and Rash  . Keflex [Cephalexin] Rash  . Sulfa Antibiotics Rash and Other (See Comments)    Patient Active Problem List   Diagnosis Date Noted  . Chest congestion 07/20/2017  . Viral illness 07/20/2017  . Lower respiratory infection 07/12/2017  . Laryngitis, acute 07/12/2017  . Cough 07/12/2017  . Pleurisy 07/12/2017  . Depression 06/03/2015  . HTN (hypertension) 01/02/2015  . Hyperlipidemia 01/02/2015  . 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 Sleep apnea 09/21/2010  . Arthritis, degenerative 02/17/2009  . Hereditary and idiopathic peripheral neuropathy 11/21/2008  . AD (atopic dermatitis) 07/08/2008  . Acid reflux 04/05/2007    Past Medical History:  Diagnosis Date  . Diabetes ([place])   . GERD (gastroesophageal reflux disease)   . Hyperlipidemia   . Hypertension   . Sleep apnea     Past Surgical History:  Procedure Laterality Date  . CARPAL TUNNEL RELEASE Right 12/28/2016   Procedure: RIGHT ULNAR AND MEDIAN NEUROPLASTY AT WRIST;  Surgeon: Milly Jakob, MD;  Location: [facility];  Service: Orthopedics;  Laterality: Right;  . REPLACEMENT TOTAL KNEE BILATERAL  2014   left 2012 rt 2014    Social History   Socioeconomic History  . Marital status: Married    Spouse name: Not on file  . Number of children: 0  . Years of education: Not on file  . Highest education level: Not on file  Occupational History  . Not on file  Social Needs  . Financial resource strain: Not on file  . Food insecurity:    Worry: Not on file    Inability: Not on file  .  Transportation needs:    Medical: Not on file    Non-medical: Not on file  Tobacco Use  . Smoking status: Former Smoker    Packs/day: 2.00    Years: 20.00    Pack years: 40.00    Types: Cigarettes    Last attempt to quit: 06/28/1996    Years since quitting: 21.2  . Smokeless tobacco: Never Used  Substance and Sexual Activity  . Alcohol use: Yes    Alcohol/week: 0.0 oz    Comment: occasional  . Drug use: No  . Sexual activity: Not on file  Lifestyle  . Physical activity:    Days per week: Not on file    Minutes per session: Not on file  . Stress: Not on file  Relationships  .  Social connections:    Talks on phone: Not on file    Gets together: Not on file    Attends religious service: Not on file    Active member of club or organization: Not on file    Attends meetings of clubs or organizations: Not on file    Relationship status: Not on file  . Intimate partner violence:    Fear of current or ex partner: Not on file    Emotionally abused: Not on file    Physically abused: Not on file    Forced sexual activity: Not on file  Other Topics Concern  . Not on file  Social History Narrative  . Not on file    Family History  Problem Relation Age of Onset  . Cancer Mother   . Kidney failure Father   . Cancer Brother      Review of Systems  Constitutional: Negative.  Negative for chills and fever.  HENT: Negative.  Negative for sore throat.   Eyes: Negative.   Respiratory: Negative.  Negative for cough and shortness of breath.   Cardiovascular: Negative.  Negative for chest pain and palpitations.  Gastrointestinal: Negative.  Negative for abdominal pain, diarrhea, nausea and vomiting.  Genitourinary: Negative for dysuria and hematuria.  Skin: Negative.  Negative for rash.  Neurological: Positive for sensory change (Both feet). Negative for dizziness and headaches.  Endo/Heme/Allergies: Negative.   All other systems reviewed and are negative.   Vitals:   09/06/17  1645  BP: 136/86  Pulse: (!) 103  Resp: 16  Temp: 98.5 F (36.9 C)  SpO2: 94%    Physical Exam  Constitutional: He is oriented to person, place, and time. He appears well-developed and well-nourished.  HENT:  Head: Normocephalic and atraumatic.  Eyes: Pupils are equal, round, and reactive to light. EOM are normal.  Neck: Normal range of motion. Neck supple.  Cardiovascular: Normal rate and regular rhythm.  Pulmonary/Chest: Effort normal and breath sounds normal.  Abdominal: Soft. There is no tenderness.  Musculoskeletal:  Feet: Chronic skin discoloration.  Palpable DP and PT pulses bilaterally.  Cap refill sluggish on the left side.  Normal on the right side.  Decreased sensation bilateral.  Neurological: He is alert and oriented to person, place, and time.  Skin: Skin is warm and dry.  Psychiatric: He has a normal mood and affect. His behavior is normal.  Vitals reviewed.  A total of 25 minutes was spent in the room with the patient, greater than 50% of which was in counseling/coordination of care regarding diabetic neuropathy and need to follow-up with both endocrinologist and podiatrist.   ASSESSMENT & PLAN: Severus was seen today for numbness and medication refill.  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 -     Ambulatory referral to Podiatry -     Ambulatory referral to Endocrinology  Diabetic feet [facility])    Patient Instructions       IF you received an x-ray today, you will receive an invoice from [facility]. Please contact [facility] at [phone] with questions or concerns regarding your invoice.   IF you received labwork today, you will receive an invoice from [place] [facility]. Please contact [name] at [phone] with questions or concerns regarding your invoice.    Our billing staff will not  be able to assist you with questions regarding bills from these companies.  You will be contacted with the lab results as soon as they are available. The fastest way to get your results is to activate your [name] account. Instructions are located on the last page of this paperwork. If you have not heard from [name] regarding the results in 2 weeks, please contact this office.     Diabetic Neuropathy Diabetic neuropathy is a nerve disease or nerve damage that is caused by diabetes mellitus. About half of all people with diabetes mellitus have some form of nerve damage. Nerve damage is more common in those who have had diabetes mellitus for many years and who generally have not had good control of their blood sugar (glucose) level. Diabetic neuropathy is a common complication of diabetes mellitus. There are three common types of diabetic neuropathy and a fourth type that is less common and less understood:  Peripheral neuropathy-This is the most common type of diabetic neuropathy. It causes damage to the nerves of the feet and legs first and then eventually the hands and arms. The damage affects the ability to sense touch.  Autonomic neuropathy-This type causes damage to the autonomic nervous system, which controls the following functions: ? Heartbeat. ? Body temperature. ? Blood pressure. ? Urination. ? Digestion. ? Sweating. ? Sexual function.  Focal neuropathy-Focal neuropathy can be painful and unpredictable and occurs most often in older adults with diabetes mellitus. It involves a specific nerve or one area and often comes on suddenly. It usually does not cause long-term problems.  Radiculoplexus neuropathy- Sometimes called lumbosacral radiculoplexus neuropathy, radiculoplexus neuropathy affects the nerves of the thighs, hips, buttocks, or legs. It is more common in people with type 2 diabetes mellitus and in older men. It is characterized by debilitating pain,  weakness, and atrophy, usually in the thigh muscles.  What are the causes? The cause of peripheral, autonomic, and focal neuropathies is diabetes mellitus that is uncontrolled and high glucose levels. The cause of radiculoplexus neuropathy is unknown. However, it is thought to be caused by inflammation related to uncontrolled glucose levels. What are the signs or symptoms? Peripheral Neuropathy Peripheral neuropathy develops slowly over time. When the nerves of the feet and legs no longer work there may be:  Burning, stabbing, or aching pain in the legs or feet.  Inability to feel pressure or pain in your feet. This can lead to: ? Thick calluses over pressure areas. ? Pressure sores. ? Ulcers.  Foot deformities.  Reduced ability to feel temperature changes.  Muscle weakness.  Autonomic Neuropathy The symptoms of autonomic neuropathy vary depending on which nerves are affected. Symptoms may include:  Problems with digestion, such as: ? Feeling sick to your stomach (nausea). ? Vomiting. ? Bloating. ? Constipation. ? Diarrhea. ? Abdominal pain.  Difficulty with urination. This occurs if you lose your ability to sense when your bladder is full. Problems include: ? Urine leakage (incontinence). ? Inability to empty your bladder completely (retention).  Rapid or irregular heartbeat (palpitations).  Blood pressure drops when you stand up (orthostatic hypotension). When you stand up you may feel: ? Dizzy. ? Weak. ? Faint.  In men, inability to attain and maintain an erection.  In women, vaginal dryness and problems with decreased sexual desire and arousal.  Problems with body temperature regulation.  Increased or decreased sweating.  Focal Neuropathy  Abnormal eye movements or abnormal alignment of both eyes.  Weakness in the wrist.  Foot drop. This  results in an inability to lift the foot properly and abnormal walking or foot movement.  Paralysis on one side of  your face (Bell palsy).  Chest or abdominal pain. Radiculoplexus Neuropathy  Sudden, severe pain in your hip, thigh, or buttocks.  Weakness and wasting of thigh muscles.  Difficulty rising from a seated position.  Abdominal swelling.  Unexplained weight loss (usually more than 10 lb [4.5 kg]). How is this diagnosed? Peripheral Neuropathy Your senses may be tested. Sensory function testing can be done with:  A light touch using a monofilament.  A vibration with tuning fork.  A sharp sensation with a pin prick.  Other tests that can help diagnose neuropathy are:  Nerve conduction velocity. This test checks the transmission of an electrical current through a nerve.  Electromyography. This shows how muscles respond to electrical signals transmitted by nearby nerves.  Quantitative sensory testing. This is used to assess how your nerves respond to vibrations and changes in temperature.  Autonomic Neuropathy Diagnosis is often based on reported symptoms. Tell your health care provider if you experience:  Dizziness.  Constipation.  Diarrhea.  Inappropriate urination or inability to urinate.  Inability to get or maintain an erection.  Tests that may be done include:  Electrocardiography or Holter monitor. These are tests that can help show problems with the heart rate or heart rhythm.  An X-ray exam may be done.  Focal Neuropathy Diagnosis is made based on your symptoms and what your health care provider finds during your exam. Other tests may be done. They may include:  Nerve conduction velocities. This checks the transmission of electrical current through a nerve.  Electromyography. This shows how muscles respond to electrical signals transmitted by nearby nerves.  Quantitative sensory testing. This test is used to assess how your nerves respond to vibration and changes in temperature.  Radiculoplexus Neuropathy  Often the first thing is to eliminate any other  issue or problems that might be the cause, as there is no standard test for diagnosis.  X-ray exam of your spine and lumbar region.  Spinal tap to rule out cancer.  MRI to rule out other lesions. How is this treated? Once nerve damage occurs, it cannot be reversed. The goal of treatment is to keep the disease or nerve damage from getting worse and affecting more nerve fibers. Controlling your blood glucose level is the key. Most people with radiculoplexus neuropathy see at least a partial improvement over time. You will need to keep your blood glucose and HbA1c levels in the target range determined by your health care provider. Things that help control blood glucose levels include:  Blood glucose monitoring.  Meal planning.  Physical activity.  Diabetes medicine.  Over time, maintaining lower blood glucose levels helps lessen symptoms. Sometimes, prescription pain medicine is needed. Follow these instructions at home:  Do not smoke.  Keep your blood glucose level in the range that you and your health care provider have determined acceptable for you.  Keep your blood pressure level in the range that you and your health care provider have determined acceptable for you.  Eat a well-balanced diet.  Be physically active every day. Include strength training and balance exercises.  Protect your feet. ? Check your feet every day for sores, cuts, blisters, or signs of infection. ? Wear padded socks and supportive shoes. Use orthotic inserts, if necessary. ? Regularly check the insides of your shoes for worn spots. Make sure there are no rocks or other items  inside your shoes before you put them on. Contact a health care provider if:  You have burning, stabbing, or aching pain in the legs or feet.  You are unable to feel pressure or pain in your feet.  You develop problems with digestion such as: ? Nausea. ? Vomiting. ? Bloating. ? Constipation. ? Diarrhea. ? Abdominal  pain.  You have difficulty with urination, such as: ? Incontinence. ? Retention.  You have palpitations.  You develop orthostatic hypotension. When you stand up you may feel: ? Dizzy. ? Weak. ? Faint.  You cannot attain and maintain an erection (in men).  You have vaginal dryness and problems with decreased sexual desire and arousal (in women).  You have severe pain in your thighs, legs, or buttocks.  You have unexplained weight loss. This information is not intended to replace advice given to you by your health care provider. Make sure you discuss any questions you have with your health care provider. Document Released: 07/12/2001 Document Revised: 10/09/2015 Document Reviewed: 10/12/2012 Elsevier Interactive Patient Education  2017 [employer]      Agustina Caroli, MD [facility]

## 2017-09-08 DIAGNOSIS — Z0271 Encounter for disability determination: Secondary | ICD-10-CM

## 2017-09-12 ENCOUNTER — Other Ambulatory Visit: Payer: Self-pay | Admitting: Family Medicine

## 2017-09-14 ENCOUNTER — Other Ambulatory Visit: Payer: Self-pay | Admitting: Family Medicine

## 2017-10-03 ENCOUNTER — Other Ambulatory Visit: Payer: Self-pay | Admitting: Family Medicine

## 2017-10-03 DIAGNOSIS — Z794 Long term (current) use of insulin: Principal | ICD-10-CM

## 2017-10-03 DIAGNOSIS — E1140 Type 2 diabetes mellitus with diabetic neuropathy, unspecified: Secondary | ICD-10-CM

## 2017-10-03 NOTE — Telephone Encounter (Signed)
Please see med refill and advise

## 2017-10-04 NOTE — Telephone Encounter (Signed)
It appears that he is going to be followed by Dr. Alvy Bimler, but I do not mind refilling that medication until his next visit.  Mychart message sent to patient to clarify dosing frequency and amounts to make sure I send in appropriate quantity.

## 2017-10-05 NOTE — Telephone Encounter (Signed)
See mychart message - 1-2 tid prn, #180 Rx, 1 refill.

## 2017-10-11 ENCOUNTER — Encounter: Payer: Self-pay | Admitting: Emergency Medicine

## 2017-10-13 ENCOUNTER — Encounter: Payer: Self-pay | Admitting: Emergency Medicine

## 2017-10-27 HISTORY — DX: Morbid (severe) obesity due to excess calories: E66.01

## 2017-11-10 ENCOUNTER — Ambulatory Visit: Payer: BC Managed Care – PPO | Admitting: Endocrinology

## 2017-11-14 ENCOUNTER — Encounter: Payer: Self-pay | Admitting: Emergency Medicine

## 2017-11-15 ENCOUNTER — Other Ambulatory Visit: Payer: Self-pay

## 2017-11-15 DIAGNOSIS — I10 Essential (primary) hypertension: Secondary | ICD-10-CM

## 2017-11-15 MED ORDER — AMLODIPINE BESYLATE 10 MG PO TABS: 5.0000 mg | ORAL_TABLET | Freq: Every day | ORAL | 3 refills | Status: DC

## 2017-12-08 ENCOUNTER — Other Ambulatory Visit: Payer: Self-pay

## 2017-12-08 ENCOUNTER — Ambulatory Visit: Payer: Managed Care, Other (non HMO) | Attending: Podiatry

## 2017-12-08 DIAGNOSIS — R2689 Other abnormalities of gait and mobility: Secondary | ICD-10-CM

## 2017-12-08 DIAGNOSIS — R296 Repeated falls: Secondary | ICD-10-CM | POA: Insufficient documentation

## 2017-12-08 NOTE — Therapy (Signed)
[place] [facility] MAIN [facility] SERVICES [address] [place], [place], [address] Phone: [phone]   Fax:  [phone]  Physical Therapy Evaluation  Patient Details  Name: Nathan Dean MRN: [id number] Date of Birth: 1956/02/02 Referring Provider: Dr. Randa Ngo    Encounter Date: 12/08/2017  PT End of Session - 12/08/17 1726    Visit Number  1    Number of Visits  6    Date for PT Re-Evaluation  01/19/18    Authorization Type  1/10     PT Start Time  0959    PT Stop Time  1055    PT Time Calculation (min)  56 min    Equipment Utilized During Treatment  Gait belt    Activity Tolerance  Patient tolerated treatment well    Behavior During Therapy  [facility] for tasks assessed/performed       Past Medical History:  Diagnosis Date  . Diabetes (HCC)   . GERD (gastroesophageal reflux disease)   . Hyperlipidemia   . Hypertension   . Sleep apnea     Past Surgical History:  Procedure Laterality Date  . CARPAL TUNNEL RELEASE Right 12/28/2016   Procedure: RIGHT ULNAR AND MEDIAN NEUROPLASTY AT WRIST;  Surgeon: Mack Hook, MD;  Location: [facility];  Service: Orthopedics;  Laterality: Right;  . REPLACEMENT TOTAL KNEE BILATERAL  2014   left 2012 rt 2014    There were no vitals filed for this visit.    Subjective Assessment - 12/08/17 1006    Subjective  Patient is a pleasant 62 year old male who presents to physical therapy evaluation for gait instability/neuropathy.     Pertinent History  Patient reports that for the last year he has fallen about six times. Right ankle has been rolling. Having difficulty getting up from the floor. Has been having difficulty walking for the past 3-4 years.  Has diabetic neuropathy in both feet, can feel pressure but not sensation. Wears diabetic shoes but not pressure socks. Has had both knees replaced (L 5 years ago, R 3 years ago-remake). Would like to be more steady and decrease falls risk. Would like  a quad cane but can't find one with his height.     Limitations  Sitting;Standing;Walking;House hold activities;Other (comment)    How long can you sit comfortably?  hard chair less than an hour    How long can you stand comfortably?  almost immediate    How long can you walk comfortably?  almost immediately     Patient Stated Goals  walking better and having better balance,     Currently in Pain?  No/denies      PAIN: No pain  POSTURE: Forward head rounded shoulders, trunk flexion  PROM/AROM: Limited hamstring length L and R  With R more limited than L.  Limited iliopsoas length bilaterally.   STRENGTH:  Graded on a 0-5 scale Muscle Group Left Right  Hip Flex 3/5 3/5  Hip Abd 3-/5 3-/5  Hip Add 3-/5 3-/5  Hip Ext 2+/5 2+/5  Hip IR/ER    Knee Flex 3+/5 3+/5  Knee Ext 3+/5 3+/5  Ankle DF 2+/5 2+/5  Ankle PF     SENSATION: Stocking pattern in bilateral feet    SPECIAL TESTS: Coordination: unable to preform LE  Coordination: UE finger to nose test: dysmetria bilaterally   +SLR  FUNCTIONAL MOBILITY: STS: excessive UE support, good foot placement, poor eccentric control poor ankle stability   BALANCE: Dynamic Sitting  Balance  Normal Able to sit unsupported and weight shift across midline maximally   Good Able to sit unsupported and weight shift across midline moderately   Good-/Fair+ Able to sit unsupported and weight shift across midline minimally x  Fair Minimal weight shifting ipsilateral/front, difficulty crossing midline   Fair- Reach to ipsilateral side and unable to weight shift   Poor + Able to sit unsupported with min A and reach to ipsilateral side, unable to weight shift   Poor Able to sit unsupported with mod A and reach ipsilateral/front-can't cross midline     Standing Dynamic Balance  Normal Stand independently unsupported, able to weight shift and cross midline maximally   Good Stand independently unsupported, able to weight shift and cross midline  moderately   Good-/Fair+ Stand independently unsupported, able to weight shift across midline minimally   Fair Stand independently unsupported, weight shift, and reach ipsilaterally, loss of balance when crossing midline x  Poor+ Able to stand with Min A and reach ipsilaterally, unable to weight shift   Poor Able to stand with Mod A and minimally reach ipsilaterally, unable to cross midline.     Static Sitting Balance  Normal Able to maintain balance against maximal resistance   Good Able to maintain balance against moderate resistance   Good-/Fair+ Accepts minimal resistance x  Fair Able to sit unsupported without balance loss and without UE support   Poor+ Able to maintain with Minimal assistance from individual or chair   Poor Unable to maintain balance-requires mod/max support from individual or chair     Static Standing Balance  Normal Able to maintain standing balance against maximal resistance   Good Able to maintain standing balance against moderate resistance   Good-/Fair+ Able to maintain standing balance against minimal resistance   Fair Able to stand unsupported without UE support and without LOB for 1-2 min x  Fair- Requires Min A and UE support to maintain standing without loss of balance   Poor+ Requires mod A and UE support to maintain standing without loss of balance   Poor Requires max A and UE support to maintain standing balance without loss      GAIT: Decreased step length bilaterally, excessive trunk lateral momentum with decreased heel strike with initial contact and toe off with final rocker. Occasional LOB.   OUTCOME MEASURES: TEST Outcome Interpretation  5 times sit<>stand 31 sec >60 yo, >15 sec indicates increased risk for falls  10 meter walk test     .76 seconds           m/s <1.0 m/s indicates increased risk for falls; limited community ambulator  ABC 35.6%       Berg Balance Assessment 32/56  <36/56 (100% risk for falls), 37-45 (80% risk for falls);  46-51 (>50% risk for falls); 52-55 (lower risk <25% of falls)                [facility] PT Assessment - 12/08/17 0001      Assessment   Medical Diagnosis  gait instability/neuropathy    Referring Provider  Dr. Randa Ngo     Onset Date/Surgical Date  -- a year ago    Hand Dominance  Right    Prior Therapy  yes for knees      Precautions   Precautions  Fall      Restrictions   Weight Bearing Restrictions  No      Balance Screen   Has the patient fallen in the past 6 months  Yes    How many times?  2    Has the patient had a decrease in activity level because of a fear of falling?   Yes    Is the patient reluctant to leave their home because of a fear of falling?   Yes      Home Environment   Living Environment  Private residence    Living Arrangements  Spouse/significant other    Available Help at Discharge  Family    Type of Home  House    Home Access  Stairs to enter    Entrance Stairs-Number of Steps  3    Entrance Stairs-Rails  Right;Left;Can reach both    Home Layout  One level    Home Equipment  Cane - quad;Walker - standard;Toilet riser      Prior Function   Level of Independence  Independent with basic ADLs    Vocation  Retired;Unemployed recently layed off    Leisure  watch tv, play computer games, foster family for kittens      Cognition   Overall Cognitive Status  Within Functional Limits for tasks assessed      Standardized Balance Assessment   Standardized Balance Assessment  Berg Balance Test      Berg Balance Test   Sit to Stand  Able to stand  independently using hands    Standing Unsupported  Able to stand 2 minutes with supervision    Sitting with Back Unsupported but Feet Supported on Floor or Stool  Able to sit safely and securely 2 minutes    Stand to Sit  Controls descent by using hands    Transfers  Able to transfer safely, definite need of hands    Standing Unsupported with Eyes Closed  Able to stand 3 seconds    Standing Ubsupported with Feet  Together  Needs help to attain position but able to stand for 30 seconds with feet together    From Standing, Reach Forward with Outstretched Arm  Can reach forward >12 cm safely (5")    From Standing Position, Pick up Object from Floor  Unable to pick up and needs supervision    From Standing Position, Turn to Look Behind Over each Shoulder  Looks behind from both sides and weight shifts well    Turn 360 Degrees  Able to turn 360 degrees safely but slowly    Standing Unsupported, Alternately Place Feet on Step/Stool  Able to complete >2 steps/needs minimal assist    Standing Unsupported, One Foot in Front  Able to take small step independently and hold 30 seconds    Standing on One Leg  Unable to try or needs assist to prevent fall    Total Score  32        39.5 inch quad cane script     Treat:   Access Code: [facility]  URL: [email or web address]  Date: 12/08/2017  Prepared by: Precious Bard   Exercises  Seated Eccentric Ankle Plantar Flexion with Resistance - 10 reps - 1 sets - 5 hold - 1x daily - 7x weekly  Seated Ankle Eversion with Resistance - 10 reps - 1 sets - 5 hold - 1x daily - 7x weekly  Seated Ankle Inversion with Resistance - 10 reps - 1 sets - 5 hold - 1x daily - 7x weekly  Seated Toe Raise - 10 reps - 1 sets - 5 hold - 1x daily - 7x weekly  Seated Hamstring Stretch - 10 reps - 1 sets -  30 hold - 1x daily - 7x weekly  Modified Thomas Stretch - 2 reps - 1 sets - 30 hold - 1x daily - 7x weekly     Objective measurements completed on examination: See above findings.              PT Education - 12/08/17 1726    Education Details  Goals, POC, stability     Person(s) Educated  Patient    Methods  Explanation;Demonstration;Handout    Comprehension  Verbalized understanding;Returned demonstration;Need further instruction       PT Short Term Goals - 12/08/17 1732      PT SHORT TERM GOAL #1   Title  Patient will be independent in home exercise  program to improve strength/mobility for better functional independence with ADLs.    Baseline  HEP given     Time  2    Period  Weeks    Status  New    Target Date  12/22/17      PT SHORT TERM GOAL #2   Title  Patient will perform STS from chair where knees are at 90 90 without UE support to increase mobility     Baseline  unable to perform     Time  2    Period  Weeks    Status  New    Target Date  12/22/17      PT SHORT TERM GOAL #3   Title   Patient will deny any falls over past 2 weeks to demonstrate improved safety awareness at home and work    Baseline  fell 2 weeks ago    Time  2    Period  Weeks    Status  New    Target Date  12/22/17        PT Long Term Goals - 12/08/17 1734      PT LONG TERM GOAL #1   Title  Patient will increase Berg Balance score by > 6 points (38/56) to demonstrate decreased fall risk during functional activities.    Baseline  7/25: 38/56    Time  6    Period  Weeks    Status  New    Target Date  01/19/18      PT LONG TERM GOAL #2   Title  Patient will increase ABC scale score >60% to demonstrate better functional mobility and better confidence with ADLs.     Baseline  7/25: 35.6%    Time  6    Period  Weeks    Status  New    Target Date  01/19/18      PT LONG TERM GOAL #3   Title  Patient will be modified independent in walking on even/uneven surface with least restrictive assistive device, for 20+ minutes without rest break, reporting some difficulty or less to improve walking tolerance with community ambulation including grocery shopping, going to church,etc.     Baseline  unsteady immediately upon standing     Time  6    Period  Weeks    Status  New    Target Date  01/19/18      PT LONG TERM GOAL #4   Title  Patient (> 513 years old) will complete five times sit to stand test in < 15 seconds indicating an increased LE strength and improved balance.    Baseline  7/25: 31 seconds with excessive UE support     Time  6    Period   Weeks  Status  New    Target Date  01/19/18             Plan - 12/08/17 1728    Clinical Impression Statement  Patient is a pleasant 62 year old male who presents to physical therapy evaluation for gait instability/neuropathy. Patient has absent ankle response to instability resulting in poor static and dynamic stability and frequent trunk sway. Patient would benefit from a new quad cane however due to his height will need one special ordered. Currently is not using any AD for ambulation. ABC is 35.6%, 5xSTS 31 seconds with excessive UE support, BERG 32/56, and 10MWT= .65m/s. Patient would benefit from skilled physical therapy to improve stability, increase strength and ambulation, and decrease fall risk to improve quality of life.    History and Personal Factors relevant to plan of care:  This patient presents with 1- 2,, personal factors/ comorbidities, and 3 body elements including body structures and functions, activity limitations and or participation restrictions. Patient's condition is  evolving,    Clinical Presentation  Evolving    Clinical Presentation due to:  progressively worsening loss of balance, weakness, and neuropathy in feet resulting in increased falls.     Clinical Decision Making  Moderate    Rehab Potential  Fair    Clinical Impairments Affecting Rehab Potential  (+) age, family support, motivation (-) progressive weakening of LE's, difficutly with ADs due to height     PT Frequency  1x / week    PT Duration  6 weeks    PT Treatment/Interventions  ADLs/Self Care Home Management;Aquatic Therapy;Biofeedback;Electrical Stimulation;Traction;Functional mobility training;Stair training;Gait training;DME Instruction;Ultrasound;Moist Heat;Therapeutic activities;Therapeutic exercise;Neuromuscular re-education;Balance training;Manual techniques;Orthotic Fit/Training;Patient/family education;Compression bandaging;Passive range of motion;Dry needling;Vestibular;Taping;Vasopneumatic  Device;Energy conservation    PT Next Visit Plan  review HEP, ankle stability, balance    PT Home Exercise Plan  see sheet    Consulted and Agree with Plan of Care  Patient       Patient will benefit from skilled therapeutic intervention in order to improve the following deficits and impairments:  Abnormal gait, Decreased activity tolerance, Decreased balance, Decreased knowledge of precautions, Decreased endurance, Decreased coordination, Decreased knowledge of use of DME, Decreased mobility, Decreased range of motion, Difficulty walking, Decreased strength, Hypomobility, Impaired flexibility, Impaired perceived functional ability, Impaired sensation, Postural dysfunction, Improper body mechanics  Visit Diagnosis: Other abnormalities of gait and mobility  Repeated falls     Problem List Patient Active Problem List   Diagnosis Date Noted  . Chest congestion 07/20/2017  . Viral illness 07/20/2017  . Lower respiratory infection 07/12/2017  . Laryngitis, acute 07/12/2017  . Cough 07/12/2017  . Pleurisy 07/12/2017  . Depression 06/03/2015  . HTN (hypertension) 01/02/2015  . Hyperlipidemia 01/02/2015  . 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 Sleep apnea 09/21/2010  . Arthritis, degenerative 02/17/2009  . Hereditary and idiopathic peripheral neuropathy 11/21/2008  . AD (atopic dermatitis) 07/08/2008  . Acid reflux 04/05/2007   Precious Bard, PT, DPT   12/08/2017, 5:37 PM  [place] [facility] MAIN [facility] SERVICES [address] [place], [place], [address] Phone: [phone]   Fax:  [phone]  Name: Jamarcus Laduke MRN: [id number] Date of Birth: 14-Jan-1956

## 2017-12-08 NOTE — Patient Instructions (Signed)
Access Code: [email or web address]  URL: [email or web address]  Date: 12/08/2017  Prepared by: Precious BardMarina Joeli Fenner   Exercises  Seated Eccentric Ankle Plantar Flexion with Resistance - 10 reps - 1 sets - 5 hold - 1x daily - 7x weekly  Seated Ankle Eversion with Resistance - 10 reps - 1 sets - 5 hold - 1x daily - 7x weekly  Seated Ankle Inversion with Resistance - 10 reps - 1 sets - 5 hold - 1x daily - 7x weekly  Seated Toe Raise - 10 reps - 1 sets - 5 hold - 1x daily - 7x weekly  Seated Hamstring Stretch - 10 reps - 1 sets - 30 hold - 1x daily - 7x weekly  Modified Thomas Stretch - 2 reps - 1 sets - 30 hold - 1x daily - 7x weekly

## 2017-12-12 ENCOUNTER — Ambulatory Visit: Payer: Managed Care, Other (non HMO)

## 2017-12-12 DIAGNOSIS — R2689 Other abnormalities of gait and mobility: Secondary | ICD-10-CM

## 2017-12-12 DIAGNOSIS — R296 Repeated falls: Secondary | ICD-10-CM

## 2017-12-12 NOTE — Therapy (Signed)
[place] [facility] MAIN [facility] SERVICES [address] [place], [place], [address] Phone: [phone]   Fax:  [phone]  Physical Therapy Treatment  Patient Details  Name: Nathan Dean MRN: [id number] Date of Birth: [birth year] Referring Provider: Dr. Randa Ngo    Encounter Date: 12/12/2017  PT End of Session - 12/13/17 0807    Visit Number  2    Number of Visits  6    Date for PT Re-Evaluation  01/19/18    Authorization Type  2/10     PT Start Time  1345    PT Stop Time  1429    PT Time Calculation (min)  44 min    Equipment Utilized During Treatment  Gait belt    Activity Tolerance  Patient tolerated treatment well    Behavior During Therapy  [facility] for tasks assessed/performed       Past Medical History:  Diagnosis Date  . Diabetes (HCC)   . GERD (gastroesophageal reflux disease)   . Hyperlipidemia   . Hypertension   . Sleep apnea     Past Surgical History:  Procedure Laterality Date  . CARPAL TUNNEL RELEASE Right 12/28/2016   Procedure: RIGHT ULNAR AND MEDIAN NEUROPLASTY AT WRIST;  Surgeon: Mack Hook, MD;  Location: [facility];  Service: Orthopedics;  Laterality: Right;  . REPLACEMENT TOTAL KNEE BILATERAL  2014   left 2012 rt 2014    There were no vitals filed for this visit.  Subjective Assessment - 12/12/17 1407    Subjective  Patient reports having severe sorness after evaluation that appeared the next morning. Did not do HEP due to soreness.     Pertinent History  Patient reports that for the last year he has fallen about six times. Right ankle has been rolling. Having difficulty getting up from the floor. Has been having difficulty walking for the past 3-4 years.  Has diabetic neuropathy in both feet, can feel pressure but not sensation. Wears diabetic shoes but not pressure socks. Has had both knees replaced (L 5 years ago, R 3 years ago-remake). Would like to be more steady and decrease falls risk. Would like a  quad cane but can't find one with his height.     Limitations  Sitting;Standing;Walking;House hold activities;Other (comment)    How long can you sit comfortably?  hard chair less than an hour    How long can you stand comfortably?  almost immediate    How long can you walk comfortably?  almost immediately     Patient Stated Goals  walking better and having better balance,     Currently in Pain?  No/denies        Balance in // bars  Airex pad: Static stand 3 minutes with occasional single hand taps to re-adjust balance   balloon taps: 3 minutes reaching inside and outside BOS   Forward lunges onto BOSU // bars 12x each leg,BUE support  Side lunges onto BOSU in // bars 12 x each leg, BUE support ; fatigued  Lunges hip flexor stretch 3x30 seconds   Step over and back orange hurdle 10x each leg BUE support   Side step over and back orange hurdle 10x each leg BUE support                       PT Education - 12/12/17 1408    Education Details  stability, exercise technique     Person(s) Educated  Patient  Methods  Explanation;Demonstration;Verbal cues    Comprehension  Verbalized understanding;Returned demonstration       PT Short Term Goals - 12/08/17 1732      PT SHORT TERM GOAL #1   Title  Patient will be independent in home exercise program to improve strength/mobility for better functional independence with ADLs.    Baseline  HEP given     Time  2    Period  Weeks    Status  New    Target Date  12/22/17      PT SHORT TERM GOAL #2   Title  Patient will perform STS from chair where knees are at 90 90 without UE support to increase mobility     Baseline  unable to perform     Time  2    Period  Weeks    Status  New    Target Date  12/22/17      PT SHORT TERM GOAL #3   Title   Patient will deny any falls over past 2 weeks to demonstrate improved safety awareness at home and work    Baseline  fell 2 weeks ago    Time  2    Period  Weeks     Status  New    Target Date  12/22/17        PT Long Term Goals - 12/08/17 1734      PT LONG TERM GOAL #1   Title  Patient will increase Berg Balance score by > 6 points (38/56) to demonstrate decreased fall risk during functional activities.    Baseline  7/25: 38/56    Time  6    Period  Weeks    Status  New    Target Date  01/19/18      PT LONG TERM GOAL #2   Title  Patient will increase ABC scale score >60% to demonstrate better functional mobility and better confidence with ADLs.     Baseline  7/25: 35.6%    Time  6    Period  Weeks    Status  New    Target Date  01/19/18      PT LONG TERM GOAL #3   Title  Patient will be modified independent in walking on even/uneven surface with least restrictive assistive device, for 20+ minutes without rest break, reporting some difficulty or less to improve walking tolerance with community ambulation including grocery shopping, going to church,etc.     Baseline  unsteady immediately upon standing     Time  6    Period  Weeks    Status  New    Target Date  01/19/18      PT LONG TERM GOAL #4   Title  Patient (> 49 years old) will complete five times sit to stand test in < 15 seconds indicating an increased LE strength and improved balance.    Baseline  7/25: 31 seconds with excessive UE support     Time  6    Period  Weeks    Status  New    Target Date  01/19/18            Plan - 12/13/17 0810    Clinical Impression Statement  Patient presents with soreness of medial aspect of bilateral knees. Patient challenged with functional ankle mobility due to soreness with medial knee. Static and dynamic balance are challenging to patient with excessive fatigue throughout session. Patient would benefit from skilled physical therapy to improve stability,  increase strength and ambulation, and decrease fall risk to improve quality of life.    Rehab Potential  Fair    Clinical Impairments Affecting Rehab Potential  (+) age, family support,  motivation (-) progressive weakening of LE's, difficutly with ADs due to height     PT Frequency  1x / week    PT Duration  6 weeks    PT Treatment/Interventions  ADLs/Self Care Home Management;Aquatic Therapy;Biofeedback;Electrical Stimulation;Traction;Functional mobility training;Stair training;Gait training;DME Instruction;Ultrasound;Moist Heat;Therapeutic activities;Therapeutic exercise;Neuromuscular re-education;Balance training;Manual techniques;Orthotic Fit/Training;Patient/family education;Compression bandaging;Passive range of motion;Dry needling;Vestibular;Taping;Vasopneumatic Device;Energy conservation    PT Next Visit Plan  review HEP, ankle stability, balance    PT Home Exercise Plan  see sheet    Consulted and Agree with Plan of Care  Patient       Patient will benefit from skilled therapeutic intervention in order to improve the following deficits and impairments:  Abnormal gait, Decreased activity tolerance, Decreased balance, Decreased knowledge of precautions, Decreased endurance, Decreased coordination, Decreased knowledge of use of DME, Decreased mobility, Decreased range of motion, Difficulty walking, Decreased strength, Hypomobility, Impaired flexibility, Impaired perceived functional ability, Impaired sensation, Postural dysfunction, Improper body mechanics  Visit Diagnosis: Other abnormalities of gait and mobility  Repeated falls     Problem List Patient Active Problem List   Diagnosis Date Noted  . Chest congestion 07/20/2017  . Viral illness 07/20/2017  . Lower respiratory infection 07/12/2017  . Laryngitis, acute 07/12/2017  . Cough 07/12/2017  . Pleurisy 07/12/2017  . Depression 06/03/2015  . HTN (hypertension) 01/02/2015  . Hyperlipidemia 01/02/2015  . 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 Sleep apnea 09/21/2010  . Arthritis, degenerative 02/17/2009  . Hereditary and idiopathic peripheral neuropathy  11/21/2008  . AD (atopic dermatitis) 07/08/2008  . Acid reflux 04/05/2007  Precious BardMarina Jamilee Lafosse, PT, DPT   12/13/2017, 8:11 AM  [place] [facility] [address] [place], [place], [address] Phone: [phone]   Fax:  [phone]  Name: Nathan Dean MRN: [id number] Date of Birth: 09/08/1955

## 2017-12-15 ENCOUNTER — Ambulatory Visit: Payer: Managed Care, Other (non HMO) | Attending: Podiatry

## 2017-12-15 DIAGNOSIS — R2689 Other abnormalities of gait and mobility: Secondary | ICD-10-CM | POA: Diagnosis not present

## 2017-12-15 DIAGNOSIS — R296 Repeated falls: Secondary | ICD-10-CM | POA: Insufficient documentation

## 2017-12-15 NOTE — Therapy (Signed)
[place] [facility] [address] [place], [place], [address] Phone: [phone]   Fax:  [phone]  Physical Therapy Treatment  Patient Details  Name: Nathan MottGregory Kakos MRN: [id number] Date of Birth: 09/13/1955 Referring Provider: Dr. Randa NgoJ Fowler    Encounter Date: 12/15/2017  PT End of Session - 12/15/17 1256    Visit Number  3    Number of Visits  6    Date for PT Re-Evaluation  01/19/18    Authorization Type  3/10     PT Start Time  1259    PT Stop Time  1344    PT Time Calculation (min)  45 min    Equipment Utilized During Treatment  Gait belt    Activity Tolerance  Patient tolerated treatment well    Behavior During Therapy  [facility] for tasks assessed/performed       Past Medical History:  Diagnosis Date  . Diabetes (HCC)   . GERD (gastroesophageal reflux disease)   . Hyperlipidemia   . Hypertension   . Sleep apnea     Past Surgical History:  Procedure Laterality Date  . CARPAL TUNNEL RELEASE Right 12/28/2016   Procedure: RIGHT ULNAR AND MEDIAN NEUROPLASTY AT WRIST;  Surgeon: Mack Hookhompson, David, MD;  Location: [facility];  Service: Orthopedics;  Laterality: Right;  . REPLACEMENT TOTAL KNEE BILATERAL  2014   left 2012 rt 2014    There were no vitals filed for this visit.  Subjective Assessment - 12/15/17 1303    Subjective  Patient reports soreness in medial knees bilaterally. Compliant with HEP.     Pertinent History  Patient reports that for the last year he has fallen about six times. Right ankle has been rolling. Having difficulty getting up from the floor. Has been having difficulty walking for the past 3-4 years.  Has diabetic neuropathy in both feet, can feel pressure but not sensation. Wears diabetic shoes but not pressure socks. Has had both knees replaced (L 5 years ago, R 3 years ago-remake). Would like to be more steady and decrease falls risk. Would like a quad cane but can't find one with his height.      Limitations  Sitting;Standing;Walking;House hold activities;Other (comment)    How long can you sit comfortably?  hard chair less than an hour    How long can you stand comfortably?  almost immediate    How long can you walk comfortably?  almost immediately     Patient Stated Goals  walking better and having better balance,     Currently in Pain?  Yes    Pain Score  4     Pain Location  Knee    Pain Orientation  Right;Left    Pain Descriptors / Indicators  Aching    Pain Type  Acute pain    Pain Onset  In the past 7 days    Pain Frequency  Intermittent         Balance in // bars   Airex pad: Static stand 3 minutes with occasional single hand taps to re-adjust balance; horizontal head turns   Standing marches 10x each leg keeping feet on either side of line.   One leg on yellow dyna disc 2x 30 seconds each leg to promote modified single limb stnce    balloon taps: 3 minutes reaching inside and outside BOS    Lunges hip flexor stretch 3x30 seconds   Step over and back orange hurdle 12x each leg BUE  support    Side step over and back orange hurdle 12x each leg BUE support   tap sticky note 1-6 with L or R LE with PT guiding number with decreasing UE support to single UE.   Seated BAPs board 10 circles clockwise, 10 circles counterclockwise                       PT Education - 12/15/17 1339    Education Details  stability, exercise technique     Person(s) Educated  Patient    Methods  Explanation;Demonstration;Verbal cues    Comprehension  Verbalized understanding;Returned demonstration       PT Short Term Goals - 12/08/17 1732      PT SHORT TERM GOAL #1   Title  Patient will be independent in home exercise program to improve strength/mobility for better functional independence with ADLs.    Baseline  HEP given     Time  2    Period  Weeks    Status  New    Target Date  12/22/17      PT SHORT TERM GOAL #2   Title  Patient will perform STS  from chair where knees are at 90 90 without UE support to increase mobility     Baseline  unable to perform     Time  2    Period  Weeks    Status  New    Target Date  12/22/17      PT SHORT TERM GOAL #3   Title   Patient will deny any falls over past 2 weeks to demonstrate improved safety awareness at home and work    Baseline  fell 2 weeks ago    Time  2    Period  Weeks    Status  New    Target Date  12/22/17        PT Long Term Goals - 12/08/17 1734      PT LONG TERM GOAL #1   Title  Patient will increase Berg Balance score by > 6 points (38/56) to demonstrate decreased fall risk during functional activities.    Baseline  7/25: 38/56    Time  6    Period  Weeks    Status  New    Target Date  01/19/18      PT LONG TERM GOAL #2   Title  Patient will increase ABC scale score >60% to demonstrate better functional mobility and better confidence with ADLs.     Baseline  7/25: 35.6%    Time  6    Period  Weeks    Status  New    Target Date  01/19/18      PT LONG TERM GOAL #3   Title  Patient will be modified independent in walking on even/uneven surface with least restrictive assistive device, for 20+ minutes without rest break, reporting some difficulty or less to improve walking tolerance with community ambulation including grocery shopping, going to church,etc.     Baseline  unsteady immediately upon standing     Time  6    Period  Weeks    Status  New    Target Date  01/19/18      PT LONG TERM GOAL #4   Title  Patient (> 63 years old) will complete five times sit to stand test in < 15 seconds indicating an increased LE strength and improved balance.    Baseline  7/25: 31 seconds with  excessive UE support     Time  6    Period  Weeks    Status  New    Target Date  01/19/18            Plan - 12/15/17 1343    Clinical Impression Statement  Patient fatigues quickly throughout session requiring seated rest breaks. Decreased collapse of R foot noted with  continued challenge with ankle stability requiring increased UE support when performing modified single limb support on RLE. Patient would benefit from skilled physical therapy to improve stability, increase strength and ambulation, and decrease fall risk to improve quality of life.    Rehab Potential  Fair    Clinical Impairments Affecting Rehab Potential  (+) age, family support, motivation (-) progressive weakening of LE's, difficutly with ADs due to height     PT Frequency  1x / week    PT Duration  6 weeks    PT Treatment/Interventions  ADLs/Self Care Home Management;Aquatic Therapy;Biofeedback;Electrical Stimulation;Traction;Functional mobility training;Stair training;Gait training;DME Instruction;Ultrasound;Moist Heat;Therapeutic activities;Therapeutic exercise;Neuromuscular re-education;Balance training;Manual techniques;Orthotic Fit/Training;Patient/family education;Compression bandaging;Passive range of motion;Dry needling;Vestibular;Taping;Vasopneumatic Device;Energy conservation    PT Next Visit Plan  review HEP, ankle stability, balance    PT Home Exercise Plan  see sheet    Consulted and Agree with Plan of Care  Patient       Patient will benefit from skilled therapeutic intervention in order to improve the following deficits and impairments:  Abnormal gait, Decreased activity tolerance, Decreased balance, Decreased knowledge of precautions, Decreased endurance, Decreased coordination, Decreased knowledge of use of DME, Decreased mobility, Decreased range of motion, Difficulty walking, Decreased strength, Hypomobility, Impaired flexibility, Impaired perceived functional ability, Impaired sensation, Postural dysfunction, Improper body mechanics  Visit Diagnosis: Other abnormalities of gait and mobility  Repeated falls     Problem List Patient Active Problem List   Diagnosis Date Noted  . Chest congestion 07/20/2017  . Viral illness 07/20/2017  . Lower respiratory infection  07/12/2017  . Laryngitis, acute 07/12/2017  . Cough 07/12/2017  . Pleurisy 07/12/2017  . Depression 06/03/2015  . HTN (hypertension) 01/02/2015  . Hyperlipidemia 01/02/2015  . 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 Sleep apnea 09/21/2010  . Arthritis, degenerative 02/17/2009  . Hereditary and idiopathic peripheral neuropathy 11/21/2008  . AD (atopic dermatitis) 07/08/2008  . Acid reflux 04/05/2007    Precious Bard, PT, DPT    12/15/2017, 2:44 PM  [place] [facility] MAIN [facility] SERVICES [address] [place], [place], [address] Phone: [phone]   Fax:  [phone]  Name: Mainor Hellmann MRN: [id number] Date of Birth: [birth year]

## 2017-12-19 ENCOUNTER — Ambulatory Visit: Payer: Managed Care, Other (non HMO)

## 2017-12-22 ENCOUNTER — Other Ambulatory Visit: Payer: Self-pay | Admitting: Family Medicine

## 2017-12-22 ENCOUNTER — Ambulatory Visit: Payer: Managed Care, Other (non HMO)

## 2017-12-22 DIAGNOSIS — R2689 Other abnormalities of gait and mobility: Secondary | ICD-10-CM | POA: Diagnosis not present

## 2017-12-22 DIAGNOSIS — E1140 Type 2 diabetes mellitus with diabetic neuropathy, unspecified: Secondary | ICD-10-CM

## 2017-12-22 DIAGNOSIS — R296 Repeated falls: Secondary | ICD-10-CM

## 2017-12-22 DIAGNOSIS — Z794 Long term (current) use of insulin: Principal | ICD-10-CM

## 2017-12-22 NOTE — Telephone Encounter (Signed)
Refill of Neurontin  LRF 10/05/17  #180  1 refill  LOV 09/06/17 Dr. Alvy BimlerSagardia   [id number] [address] [address], [place] - [id number]

## 2017-12-22 NOTE — Therapy (Signed)
[facility] [facility] MAIN [facility] SERVICES [address] [place], [place], [address] Phone: [phone]   Fax:  [phone]  Physical Therapy Treatment  Patient Details  Name: Nathan Dean MRN: [id number] Date of Birth: 07-24-1955 Referring Provider: Dr. Randa Ngo    Encounter Date: 12/22/2017  PT End of Session - 12/22/17 1336    Visit Number  4    Number of Visits  6    Date for PT Re-Evaluation  01/19/18    Authorization Type  4/10     PT Start Time  1336    PT Stop Time  1423    PT Time Calculation (min)  47 min    Equipment Utilized During Treatment  Gait belt    Activity Tolerance  Patient tolerated treatment well    Behavior During Therapy  [facility] for tasks assessed/performed       Past Medical History:  Diagnosis Date  . Diabetes (HCC)   . GERD (gastroesophageal reflux disease)   . Hyperlipidemia   . Hypertension   . Sleep apnea     Past Surgical History:  Procedure Laterality Date  . CARPAL TUNNEL RELEASE Right 12/28/2016   Procedure: RIGHT ULNAR AND MEDIAN NEUROPLASTY AT WRIST;  Surgeon: Mack Hook, MD;  Location: [facility];  Service: Orthopedics;  Laterality: Right;  . REPLACEMENT TOTAL KNEE BILATERAL  2014   left 2012 rt 2014    There were no vitals filed for this visit.  Subjective Assessment - 12/22/17 1341    Subjective  Patient missed last session due to sinus infections. Patient has difficulty with sit to stand from knees at 90 90 position.     Pertinent History  Patient reports that for the last year he has fallen about six times. Right ankle has been rolling. Having difficulty getting up from the floor. Has been having difficulty walking for the past 3-4 years.  Has diabetic neuropathy in both feet, can feel pressure but not sensation. Wears diabetic shoes but not pressure socks. Has had both knees replaced (L 5 years ago, R 3 years ago-remake). Would like to be more steady and decrease falls risk. Would  like a quad cane but can't find one with his height.     Limitations  Sitting;Standing;Walking;House hold activities;Other (comment)    How long can you sit comfortably?  hard chair less than an hour    How long can you stand comfortably?  almost immediate    How long can you walk comfortably?  almost immediately     Patient Stated Goals  walking better and having better balance,     Currently in Pain?  Yes    Pain Score  2     Pain Location  Leg    Pain Orientation  Right;Left    Pain Descriptors / Indicators  Aching    Pain Type  Acute pain    Pain Onset  In the past 7 days    Pain Frequency  Intermittent      sit to stand from plinth table with decreasing height. ; cues for abdominal activation, foot placement and trunk control 5 x 130 degree angle, 5 x 115 degree ankle, 2 attempts 90 90 (first successful second not) attempt again after BAPs at 90 90     Seated BAPs board 10 circles clockwise, 10 circles counterclockwise   Seated R ankle ABC (whole alphabet, two rest breaks, cues for keeping feet off the ground) (added to HEP)   GTB  Eversion 10x R leg; 2 sets hold 3 seconds GTB inversion 10x R leg; 2 sets 3 second hold   Marble R foot pickup. 10 marbles (added to HEP)   Towel scrunch 5 minutes: (added to HEP)   Plantar surface of foot check, education of feet check.                       PT Education - 12/22/17 1524    Education Details  stability. exercise technique , intrinsic foot musculature     Person(s) Educated  Patient    Methods  Explanation;Demonstration;Verbal cues    Comprehension  Verbalized understanding;Returned demonstration       PT Short Term Goals - 12/08/17 1732      PT SHORT TERM GOAL #1   Title  Patient will be independent in home exercise program to improve strength/mobility for better functional independence with ADLs.    Baseline  HEP given     Time  2    Period  Weeks    Status  New    Target Date  12/22/17      PT  SHORT TERM GOAL #2   Title  Patient will perform STS from chair where knees are at 90 90 without UE support to increase mobility     Baseline  unable to perform     Time  2    Period  Weeks    Status  New    Target Date  12/22/17      PT SHORT TERM GOAL #3   Title   Patient will deny any falls over past 2 weeks to demonstrate improved safety awareness at home and work    Baseline  fell 2 weeks ago    Time  2    Period  Weeks    Status  New    Target Date  12/22/17        PT Long Term Goals - 12/08/17 1734      PT LONG TERM GOAL #1   Title  Patient will increase Berg Balance score by > 6 points (38/56) to demonstrate decreased fall risk during functional activities.    Baseline  7/25: 38/56    Time  6    Period  Weeks    Status  New    Target Date  01/19/18      PT LONG TERM GOAL #2   Title  Patient will increase ABC scale score >60% to demonstrate better functional mobility and better confidence with ADLs.     Baseline  7/25: 35.6%    Time  6    Period  Weeks    Status  New    Target Date  01/19/18      PT LONG TERM GOAL #3   Title  Patient will be modified independent in walking on even/uneven surface with least restrictive assistive device, for 20+ minutes without rest break, reporting some difficulty or less to improve walking tolerance with community ambulation including grocery shopping, going to church,etc.     Baseline  unsteady immediately upon standing     Time  6    Period  Weeks    Status  New    Target Date  01/19/18      PT LONG TERM GOAL #4   Title  Patient (> 66 years old) will complete five times sit to stand test in < 15 seconds indicating an increased LE strength and improved balance.    Baseline  7/25: 31 seconds with excessive UE support     Time  6    Period  Weeks    Status  New    Target Date  01/19/18            Plan - 12/22/17 1526    Clinical Impression Statement  Patient challenged with coordination and muscle contraction of  small musculature in plantar foot. Patient sit to stand transfer challenged at low positioning (knees at 90 90) due to LE positioning, weakness of ankle and RLE and fear of LOB. Towel scrunches, marble pick up, and ankle ABC writing added to HEP. Patient would benefit from skilled physical therapy to improve stability, increase strength and ambulation, and decrease fall risk to improve quality of life.    Rehab Potential  Fair    Clinical Impairments Affecting Rehab Potential  (+) age, family support, motivation (-) progressive weakening of LE's, difficutly with ADs due to height     PT Frequency  1x / week    PT Duration  6 weeks    PT Treatment/Interventions  ADLs/Self Care Home Management;Aquatic Therapy;Biofeedback;Electrical Stimulation;Traction;Functional mobility training;Stair training;Gait training;DME Instruction;Ultrasound;Moist Heat;Therapeutic activities;Therapeutic exercise;Neuromuscular re-education;Balance training;Manual techniques;Orthotic Fit/Training;Patient/family education;Compression bandaging;Passive range of motion;Dry needling;Vestibular;Taping;Vasopneumatic Device;Energy conservation    PT Next Visit Plan  review HEP, ankle stability, balance    PT Home Exercise Plan  see sheet    Consulted and Agree with Plan of Care  Patient       Patient will benefit from skilled therapeutic intervention in order to improve the following deficits and impairments:  Abnormal gait, Decreased activity tolerance, Decreased balance, Decreased knowledge of precautions, Decreased endurance, Decreased coordination, Decreased knowledge of use of DME, Decreased mobility, Decreased range of motion, Difficulty walking, Decreased strength, Hypomobility, Impaired flexibility, Impaired perceived functional ability, Impaired sensation, Postural dysfunction, Improper body mechanics  Visit Diagnosis: Other abnormalities of gait and mobility  Repeated falls     Problem List Patient Active Problem  List   Diagnosis Date Noted  . Chest congestion 07/20/2017  . Viral illness 07/20/2017  . Lower respiratory infection 07/12/2017  . Laryngitis, acute 07/12/2017  . Cough 07/12/2017  . Pleurisy 07/12/2017  . Depression 06/03/2015  . HTN (hypertension) 01/02/2015  . Hyperlipidemia 01/02/2015  . 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 Sleep apnea 09/21/2010  . Arthritis, degenerative 02/17/2009  . Hereditary and idiopathic peripheral neuropathy 11/21/2008  . AD (atopic dermatitis) 07/08/2008  . Acid reflux 04/05/2007   Precious BardMarina Lequita Meadowcroft, PT, DPT   12/22/2017, 3:27 PM  [facility] [facility] [address] [place], [place], [address] Phone: [phone]   Fax:  [phone]  Name: Nathan Dean MRN: [id number] Date of Birth: 04/10/1956

## 2017-12-27 ENCOUNTER — Ambulatory Visit: Payer: Managed Care, Other (non HMO)

## 2017-12-27 DIAGNOSIS — R296 Repeated falls: Secondary | ICD-10-CM

## 2017-12-27 DIAGNOSIS — R2689 Other abnormalities of gait and mobility: Secondary | ICD-10-CM

## 2017-12-27 NOTE — Therapy (Signed)
[facility] [address] [place], [place], [address] Phone: [phone]   Fax:  [phone]  Physical Therapy Treatment  Patient Details  Name: Nathan MottGregory Kamath MRN: [id number] Date of Birth: 12/20/1955 Referring Provider: Dr. Randa NgoJ Fowler    Encounter Date: 12/27/2017  PT End of Session - 12/27/17 1526    Visit Number  5    Number of Visits  6    Date for PT Re-Evaluation  01/19/18    Authorization Type  5/10     PT Start Time  1515    PT Stop Time  1600    PT Time Calculation (min)  45 min    Equipment Utilized During Treatment  Gait belt    Activity Tolerance  Patient tolerated treatment well    Behavior During Therapy  [facility] for tasks assessed/performed       Past Medical History:  Diagnosis Date  . Diabetes (HCC)   . GERD (gastroesophageal reflux disease)   . Hyperlipidemia   . Hypertension   . Sleep apnea     Past Surgical History:  Procedure Laterality Date  . CARPAL TUNNEL RELEASE Right 12/28/2016   Procedure: RIGHT ULNAR AND MEDIAN NEUROPLASTY AT WRIST;  Surgeon: Mack Hookhompson, David, MD;  Location: [facility];  Service: Orthopedics;  Laterality: Right;  . REPLACEMENT TOTAL KNEE BILATERAL  2014   left 2012 rt 2014    There were no vitals filed for this visit.  Subjective Assessment - 12/27/17 1518    Subjective  Patient reports being sore after last session for one day in his feet. Has been compliant with HEp. Has to be careful walking due to tripping and falling since last session over his kitten.     Pertinent History  Patient reports that for the last year he has fallen about six times. Right ankle has been rolling. Having difficulty getting up from the floor. Has been having difficulty walking for the past 3-4 years.  Has diabetic neuropathy in both feet, can feel pressure but not sensation. Wears diabetic shoes but not pressure socks. Has had both knees replaced (L 5 years ago, R 3 years  ago-remake). Would like to be more steady and decrease falls risk. Would like a quad cane but can't find one with his height.     Limitations  Sitting;Standing;Walking;House hold activities;Other (comment)    How long can you sit comfortably?  hard chair less than an hour    How long can you stand comfortably?  almost immediate    How long can you walk comfortably?  almost immediately     Patient Stated Goals  walking better and having better balance,     Currently in Pain?  Yes    Pain Score  3     Pain Location  Leg    Pain Orientation  Right;Left    Pain Descriptors / Indicators  Aching    Pain Type  Acute pain    Pain Onset  In the past 7 days    Pain Frequency  Intermittent       sit to stand from plinth table with decreasing height. ; cues for abdominal activation, foot placement and trunk control 5 x 130 degree angle, 5 x 115 degree ankle, 2 attempts ; one trial of utilizing one hand cane one hand plinth.     Seated BAPs board 10 circles clockwise, 10 circles counterclockwise   Airex pad static stand. Stand 60 seconds two touches for  stability with no UE support  Airex pad weight shift: slight finger tip support, terminated due to pain  Modified seated piriformis stretch 2x30 seconds  Hamstring stretch seated with 6" step 60 seconds each leg. 2 sets each leg   Seated R ankle ABC (whole alphabet, two rest breaks, cues for keeping feet off the ground) (added to HEP) : performed on L too.    GTB Eversion 10x R leg; 2 sets hold 3 seconds GTB inversion 10x R leg; 2 sets 3 second hold    Marble R foot pickup. 10 marbles (added to HEP)    Towel scrunch 5 minutes: (added to HEP)    Plantar surface of foot check, education of feet check.   Walking irritated low back pain in L low back.                           PT Education - 12/27/17 1526    Education Details  stability, transfers, exercise technique     Person(s) Educated  Patient    Methods   Explanation;Demonstration;Verbal cues    Comprehension  Verbalized understanding;Returned demonstration       PT Short Term Goals - 12/08/17 1732      PT SHORT TERM GOAL #1   Title  Patient will be independent in home exercise program to improve strength/mobility for better functional independence with ADLs.    Baseline  HEP given     Time  2    Period  Weeks    Status  New    Target Date  12/22/17      PT SHORT TERM GOAL #2   Title  Patient will perform STS from chair where knees are at 90 90 without UE support to increase mobility     Baseline  unable to perform     Time  2    Period  Weeks    Status  New    Target Date  12/22/17      PT SHORT TERM GOAL #3   Title   Patient will deny any falls over past 2 weeks to demonstrate improved safety awareness at home and work    Baseline  fell 2 weeks ago    Time  2    Period  Weeks    Status  New    Target Date  12/22/17        PT Long Term Goals - 12/08/17 1734      PT LONG TERM GOAL #1   Title  Patient will increase Berg Balance score by > 6 points (38/56) to demonstrate decreased fall risk during functional activities.    Baseline  7/25: 38/56    Time  6    Period  Weeks    Status  New    Target Date  01/19/18      PT LONG TERM GOAL #2   Title  Patient will increase ABC scale score >60% to demonstrate better functional mobility and better confidence with ADLs.     Baseline  7/25: 35.6%    Time  6    Period  Weeks    Status  New    Target Date  01/19/18      PT LONG TERM GOAL #3   Title  Patient will be modified independent in walking on even/uneven surface with least restrictive assistive device, for 20+ minutes without rest break, reporting some difficulty or less to improve walking tolerance with community ambulation  including grocery shopping, going to church,etc.     Baseline  unsteady immediately upon standing     Time  6    Period  Weeks    Status  New    Target Date  01/19/18      PT LONG TERM GOAL #4    Title  Patient (> 63 years old) will complete five times sit to stand test in < 15 seconds indicating an increased LE strength and improved balance.    Baseline  7/25: 31 seconds with excessive UE support     Time  6    Period  Weeks    Status  New    Target Date  01/19/18            Plan - 12/27/17 1535    Clinical Impression Statement  Patient continues to demonstrate quick fatigue with using intrinsic foot musculature and ankle stabilization muscles requiring frequent rest breaks. Sit to stands are improving in coordination and quality of motor control. therapy to improve stability, increase strength and ambulation, and decrease fall risk to improve quality of life.    Rehab Potential  Fair    Clinical Impairments Affecting Rehab Potential  (+) age, family support, motivation (-) progressive weakening of LE's, difficutly with ADs due to height     PT Frequency  1x / week    PT Duration  6 weeks    PT Treatment/Interventions  ADLs/Self Care Home Management;Aquatic Therapy;Biofeedback;Electrical Stimulation;Traction;Functional mobility training;Stair training;Gait training;DME Instruction;Ultrasound;Moist Heat;Therapeutic activities;Therapeutic exercise;Neuromuscular re-education;Balance training;Manual techniques;Orthotic Fit/Training;Patient/family education;Compression bandaging;Passive range of motion;Dry needling;Vestibular;Taping;Vasopneumatic Device;Energy conservation    PT Next Visit Plan  review HEP, ankle stability, balance    PT Home Exercise Plan  see sheet    Consulted and Agree with Plan of Care  Patient       Patient will benefit from skilled therapeutic intervention in order to improve the following deficits and impairments:  Abnormal gait, Decreased activity tolerance, Decreased balance, Decreased knowledge of precautions, Decreased endurance, Decreased coordination, Decreased knowledge of use of DME, Decreased mobility, Decreased range of motion, Difficulty walking,  Decreased strength, Hypomobility, Impaired flexibility, Impaired perceived functional ability, Impaired sensation, Postural dysfunction, Improper body mechanics  Visit Diagnosis: Other abnormalities of gait and mobility  Repeated falls     Problem List Patient Active Problem List   Diagnosis Date Noted  . Chest congestion 07/20/2017  . Viral illness 07/20/2017  . Lower respiratory infection 07/12/2017  . Laryngitis, acute 07/12/2017  . Cough 07/12/2017  . Pleurisy 07/12/2017  . Depression 06/03/2015  . HTN (hypertension) 01/02/2015  . Hyperlipidemia 01/02/2015  . 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 Sleep apnea 09/21/2010  . Arthritis, degenerative 02/17/2009  . Hereditary and idiopathic peripheral neuropathy 11/21/2008  . AD (atopic dermatitis) 07/08/2008  . Acid reflux 04/05/2007   Precious Bard, PT, DPT   12/27/2017, 4:46 PM  [place] [facility] MAIN [facility] SERVICES [address] [place], [place], [address] Phone: [phone]   Fax:  [phone]  Name: Manolo Bosket MRN: [id number] Date of Birth: 04-14-1956

## 2017-12-27 NOTE — Addendum Note (Signed)
Addended by: Claudie FishermanMOSER, Eben Choinski N on: 12/27/2017 04:56 PM   Modules accepted: Orders

## 2017-12-29 ENCOUNTER — Ambulatory Visit: Payer: Managed Care, Other (non HMO)

## 2018-01-03 ENCOUNTER — Ambulatory Visit: Payer: Managed Care, Other (non HMO)

## 2018-01-03 DIAGNOSIS — R296 Repeated falls: Secondary | ICD-10-CM

## 2018-01-03 DIAGNOSIS — R2689 Other abnormalities of gait and mobility: Secondary | ICD-10-CM

## 2018-01-03 NOTE — Therapy (Signed)
[place] MAIN [facility] SERVICES [address] [place], [place], [address] Phone: [phone]   Fax:  [phone]  Physical Therapy Treatment  Patient Details  Name: Nathan Dean MRN: [id number] Date of Birth: 21-Nov-1955 Referring Provider: Dr. Roma Schanz    Encounter Date: 01/03/2018  PT End of Session - 01/03/18 1422    Visit Number  6    Number of Visits  18    Date for PT Re-Evaluation  02/14/18    Authorization Type  6/10     PT Start Time  1345    PT Stop Time  1426    PT Time Calculation (min)  41 min    Equipment Utilized During Treatment  Gait belt    Activity Tolerance  Patient tolerated treatment well    Behavior During Therapy  [facility] for tasks assessed/performed       Past Medical History:  Diagnosis Date  . Diabetes ([place])   . GERD (gastroesophageal reflux disease)   . Hyperlipidemia   . Hypertension   . Sleep apnea     Past Surgical History:  Procedure Laterality Date  . CARPAL TUNNEL RELEASE Right 12/28/2016   Procedure: RIGHT ULNAR AND MEDIAN NEUROPLASTY AT WRIST;  Surgeon: Milly Jakob, MD;  Location: [facility];  Service: Orthopedics;  Laterality: Right;  . REPLACEMENT TOTAL KNEE BILATERAL  2014   left 2012 rt 2014    There were no vitals filed for this visit.  Subjective Assessment - 01/03/18 1352    Subjective  Patient missed last session due to falling and tripping over kitten. Patient fell to ground. Since patient was seen last patient has fallen twice over the kitten. First time was in the bathroom. Landed on knees both times. Patient having pain in L hip since falls. Patient has new hurrIcane with four point base, has gotten it since Saturday and not fallen since using it.     Pertinent History  Patient reports that for the last year he has fallen about six times. Right ankle has been rolling. Having difficulty getting up from the floor. Has been having difficulty walking for the past 3-4 years.   Has diabetic neuropathy in both feet, can feel pressure but not sensation. Wears diabetic shoes but not pressure socks. Has had both knees replaced (L 5 years ago, R 3 years ago-remake). Would like to be more steady and decrease falls risk. Would like a quad cane but can't find one with his height.     Limitations  Sitting;Standing;Walking;House hold activities;Other (comment)    How long can you sit comfortably?  hard chair less than an hour    How long can you stand comfortably?  almost immediate    How long can you walk comfortably?  almost immediately     Patient Stated Goals  walking better and having better balance,     Currently in Pain?  Yes    Pain Score  4     Pain Location  Hip    Pain Orientation  Left    Pain Descriptors / Indicators  Aching    Pain Type  Acute pain    Pain Onset  1 to 4 weeks ago    Pain Frequency  Intermittent     Patient missed last session due to falling and tripping over kitten. Patient fell to ground. Since patient was seen last patient has fallen twice over the kitten. First time was in the bathroom. Landed on knees both times.  Patient having pain in L hip since falls. Patient has new hurrIcane with four point base, has gotten it since Saturday and not fallen since using it.    HEP  STS from 90 90 chair Falls; 2 over kitten  BERG: 35/56 Walking 20 minutes 5x STS :: 27 seconds ABC=36.9% Noted regression since last session; more fearful of movement.   Standing transfer from plinth table at 90 90 degrees for transitioning at restaurants and public places: 5x   [facility] PT Assessment - 01/03/18 0001      Berg Balance Test   Sit to Stand  Able to stand  independently using hands    Standing Unsupported  Able to stand 2 minutes with supervision    Sitting with Back Unsupported but Feet Supported on Floor or Stool  Able to sit safely and securely 2 minutes    Stand to Sit  Controls descent by using hands    Transfers  Able to transfer safely, definite need  of hands    Standing Unsupported with Eyes Closed  Able to stand 10 seconds with supervision    Standing Ubsupported with Feet Together  Able to place feet together independently and stand for 1 minute with supervision    From Standing, Reach Forward with Outstretched Arm  Can reach forward >12 cm safely (5")    From Standing Position, Pick up Object from Floor  Unable to pick up shoe, but reaches 2-5 cm (1-2") from shoe and balances independently    From Standing Position, Turn to Look Behind Over each Shoulder  Looks behind from both sides and weight shifts well    Turn 360 Degrees  Needs close supervision or verbal cueing    Standing Unsupported, Alternately Place Feet on Step/Stool  Able to complete >2 steps/needs minimal assist    Standing Unsupported, One Foot in Front  Able to take small step independently and hold 30 seconds    Standing on One Leg  Unable to try or needs assist to prevent fall    Total Score  35     6: step toe taps BUE support 10x each leg, terminated due to midback pain.   seated: marches painful to adductors; terminated.   Static standing 3x30 seconds, no LOB  Seated ankle heel raises 15x    Seated TrA activaiton 10x 3 second                   PT Education - 01/03/18 1421    Education Details  goals, POC, stability     Person(s) Educated  Patient    Methods  Explanation;Demonstration;Verbal cues    Comprehension  Verbalized understanding;Returned demonstration       PT Short Term Goals - 01/03/18 1352      PT SHORT TERM GOAL #1   Title  Patient will be independent in home exercise program to improve strength/mobility for better functional independence with ADLs.    Baseline  HEP compliant    Time  2    Period  Weeks    Status  Partially Met      PT SHORT TERM GOAL #2   Title  Patient will perform STS from chair where knees are at 90 90 without UE support to increase mobility     Baseline  needs to use arms    Time  2    Period   Weeks    Status  Partially Met      PT SHORT TERM GOAL #3  Title   Patient will deny any falls over past 2 weeks to demonstrate improved safety awareness at home and work    Baseline  fell 2x over kitten    Time  2    Period  Weeks    Status  On-going        PT Long Term Goals - 01/03/18 [place] #1   Title  Patient will increase Berg Balance score by > 6 points (38/56) to demonstrate decreased fall risk during functional activities.    Baseline  7/25: 32/56 8/20: 35/56     Time  6    Period  Weeks    Status  Partially Met    Target Date  02/14/18      PT LONG TERM GOAL #2   Title  Patient will increase ABC scale score >60% to demonstrate better functional mobility and better confidence with ADLs.     Baseline  7/25: 35.6% 8/20: 36.9%     Time  6    Period  Weeks    Status  Partially Met    Target Date  02/14/18      PT LONG TERM GOAL #3   Title  Patient will be modified independent in walking on even/uneven surface with least restrictive assistive device, for 20+ minutes without rest break, reporting some difficulty or less to improve walking tolerance with community ambulation including grocery shopping, going to church,etc.     Baseline  able to walk 5 minutes before feeling unsteady.     Time  6    Period  Weeks    Status  Partially Met    Target Date  02/14/18      PT LONG TERM GOAL #4   Title  Patient (> 45 years old) will complete five times sit to stand test in < 15 seconds indicating an increased LE strength and improved balance.    Baseline  7/25: 31 seconds with excessive UE support : 8/20: 27 seconds with decreased UE support     Time  6    Period  Weeks    Status  Partially Met    Target Date  02/14/18            Plan - 01/03/18 1548    Clinical Impression Statement  Despite patient missing last session due to hurting self while falling over kitten patient has made progress towards goals. Falls that have occurred recently have  only occurred while tripping over animals rather than from self LOB. 5x STS improving to 27 seconds with hands on knees. ABC improving to 36.9%, BERG 35/56. Patient now has new cane for stability and is ambulating with improved step length and forward momentum. Patient would benefit from skilled physical therapy to improve stability, increase strength and ambulation, and decrease fall risk to improve quality of life.    Rehab Potential  Fair    Clinical Impairments Affecting Rehab Potential  (+) age, family support, motivation (-) progressive weakening of LE's, difficutly with ADs due to height     PT Frequency  2x / week    PT Duration  6 weeks    PT Treatment/Interventions  ADLs/Self Care Home Management;Aquatic Therapy;Biofeedback;Electrical Stimulation;Traction;Functional mobility training;Stair training;Gait training;DME Instruction;Ultrasound;Moist Heat;Therapeutic activities;Therapeutic exercise;Neuromuscular re-education;Balance training;Manual techniques;Orthotic Fit/Training;Patient/family education;Compression bandaging;Passive range of motion;Dry needling;Vestibular;Taping;Vasopneumatic Device;Energy conservation    PT Next Visit Plan  review HEP, ankle stability, balance    PT Home Exercise Plan  see sheet  Consulted and Agree with Plan of Care  Patient       Patient will benefit from skilled therapeutic intervention in order to improve the following deficits and impairments:  Abnormal gait, Decreased activity tolerance, Decreased balance, Decreased knowledge of precautions, Decreased endurance, Decreased coordination, Decreased knowledge of use of DME, Decreased mobility, Decreased range of motion, Difficulty walking, Decreased strength, Hypomobility, Impaired flexibility, Impaired perceived functional ability, Impaired sensation, Postural dysfunction, Improper body mechanics  Visit Diagnosis: Other abnormalities of gait and mobility  Repeated falls     Problem List Patient  Active Problem List   Diagnosis Date Noted  . Chest congestion 07/20/2017  . Viral illness 07/20/2017  . Lower respiratory infection 07/12/2017  . Laryngitis, acute 07/12/2017  . Cough 07/12/2017  . Pleurisy 07/12/2017  . Depression 06/03/2015  . HTN (hypertension) 01/02/2015  . Hyperlipidemia 01/02/2015  . 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 Sleep apnea 09/21/2010  . Arthritis, degenerative 02/17/2009  . Hereditary and idiopathic peripheral neuropathy 11/21/2008  . AD (atopic dermatitis) 07/08/2008  . Acid reflux 04/05/2007   Janna Arch, PT, DPT   01/03/2018, 3:50 PM  [place] MAIN [facility] SERVICES [address] [place], [place], [address] Phone: [phone]   Fax:  [phone]  Name: Nathan Dean MRN: [id number] Date of Birth: 05-Aug-1955

## 2018-01-05 ENCOUNTER — Ambulatory Visit: Payer: Managed Care, Other (non HMO)

## 2018-01-10 ENCOUNTER — Ambulatory Visit: Payer: Managed Care, Other (non HMO)

## 2018-01-12 ENCOUNTER — Ambulatory Visit: Payer: Managed Care, Other (non HMO)

## 2018-01-12 DIAGNOSIS — R2689 Other abnormalities of gait and mobility: Secondary | ICD-10-CM

## 2018-01-12 DIAGNOSIS — R296 Repeated falls: Secondary | ICD-10-CM

## 2018-01-12 NOTE — Therapy (Signed)
[place] MAIN [facility] SERVICES [address] [place], [place], [address] Phone: [phone]   Fax:  [phone]  Physical Therapy Treatment  Patient Details  Name: Nathan Dean MRN: [id number] Date of Birth: 11/04/1955 Referring Provider: Dr. Roma Schanz    Encounter Date: 01/12/2018  PT End of Session - 01/12/18 1521    Visit Number  7    Number of Visits  18    Date for PT Re-Evaluation  02/14/18    Authorization Type  7/10     PT Start Time  1505    PT Stop Time  1550    PT Time Calculation (min)  45 min    Equipment Utilized During Treatment  Gait belt    Activity Tolerance  Patient tolerated treatment well    Behavior During Therapy  [facility] for tasks assessed/performed       Past Medical History:  Diagnosis Date  . Diabetes ([place])   . GERD (gastroesophageal reflux disease)   . Hyperlipidemia   . Hypertension   . Sleep apnea     Past Surgical History:  Procedure Laterality Date  . CARPAL TUNNEL RELEASE Right 12/28/2016   Procedure: RIGHT ULNAR AND MEDIAN NEUROPLASTY AT WRIST;  Surgeon: Milly Jakob, MD;  Location: [place];  Service: Orthopedics;  Laterality: Right;  . REPLACEMENT TOTAL KNEE BILATERAL  2014   left 2012 rt 2014    There were no vitals filed for this visit.  Subjective Assessment - 01/12/18 1506    Subjective  Patient missed last session due to pain in back and hip from fall last week. When he walks he gets stabbing pain. His foot is aching from doing his foot exercises.     Pertinent History  Patient reports that for the last year he has fallen about six times. Right ankle has been rolling. Having difficulty getting up from the floor. Has been having difficulty walking for the past 3-4 years.  Has diabetic neuropathy in both feet, can feel pressure but not sensation. Wears diabetic shoes but not pressure socks. Has had both knees replaced (L 5 years ago, R 3 years ago-remake). Would like to be more  steady and decrease falls risk. Would like a quad cane but can't find one with his height.     Limitations  Sitting;Standing;Walking;House hold activities;Other (comment)    How long can you sit comfortably?  hard chair less than an hour    How long can you stand comfortably?  almost immediate    How long can you walk comfortably?  almost immediately     Patient Stated Goals  walking better and having better balance,     Currently in Pain?  Yes    Pain Score  6     Pain Location  Back    Pain Orientation  Lower;Left    Pain Descriptors / Indicators  Aching;Spasm    Pain Type  Acute pain    Pain Onset  1 to 4 weeks ago    Pain Frequency  Constant       LE rotation 5 minutes to reduce back pain  Supine: hooklying with roll under knees for back support  TrA activation with cues for contraction and placement of hands 12 x 3 second holds.   TrA activation with cues for contraction and placement UE raises  To 90 degrees. Verbal cues for contraction  Supine: hooklying Adduction squeezes ball 10x 3 seconds ; 2 sets  GTB abduction 15x ;  2 sets   Posterior pelvic tilt 10x 3 second holds   Distraction 3x 15 seconds   Hamstring stretch 2x 60 seconds each leg  Adduction frog stretch x 30 seconds Abduction stretch no relief:   Ultrasounds on L low pelvic region: 2 cm head freuency: 1 Mhz, duty cycle 100%, Amplitude 1.5 w/cm; time 8 minutes                       PT Education - 01/12/18 1521    Education Details  back pain control, exercise technique     Person(s) Educated  Patient    Methods  Explanation;Demonstration;Verbal cues    Comprehension  Verbalized understanding;Returned demonstration       PT Short Term Goals - 01/03/18 1352      PT SHORT TERM GOAL #1   Title  Patient will be independent in home exercise program to improve strength/mobility for better functional independence with ADLs.    Baseline  HEP compliant    Time  2    Period  Weeks     Status  Partially Met      PT SHORT TERM GOAL #2   Title  Patient will perform STS from chair where knees are at 90 90 without UE support to increase mobility     Baseline  needs to use arms    Time  2    Period  Weeks    Status  Partially Met      PT SHORT TERM GOAL #3   Title   Patient will deny any falls over past 2 weeks to demonstrate improved safety awareness at home and work    Baseline  fell 2x over kitten    Time  2    Period  Weeks    Status  On-going        PT Long Term Goals - 01/03/18 [place] #1   Title  Patient will increase Berg Balance score by > 6 points (38/56) to demonstrate decreased fall risk during functional activities.    Baseline  7/25: 32/56 8/20: 35/56     Time  6    Period  Weeks    Status  Partially Met    Target Date  02/14/18      PT LONG TERM GOAL #2   Title  Patient will increase ABC scale score >60% to demonstrate better functional mobility and better confidence with ADLs.     Baseline  7/25: 35.6% 8/20: 36.9%     Time  6    Period  Weeks    Status  Partially Met    Target Date  02/14/18      PT LONG TERM GOAL #3   Title  Patient will be modified independent in walking on even/uneven surface with least restrictive assistive device, for 20+ minutes without rest break, reporting some difficulty or less to improve walking tolerance with community ambulation including grocery shopping, going to church,etc.     Baseline  able to walk 5 minutes before feeling unsteady.     Time  6    Period  Weeks    Status  Partially Met    Target Date  02/14/18      PT LONG TERM GOAL #4   Title  Patient (> 29 years old) will complete five times sit to stand test in < 15 seconds indicating an increased LE strength and improved balance.  Baseline  7/25: 31 seconds with excessive UE support : 8/20: 27 seconds with decreased UE support     Time  6    Period  Weeks    Status  Partially Met    Target Date  02/14/18             Plan - 01/12/18 1525    Clinical Impression Statement  Focus on patient's back pain due to it affecting patients stability and balance. Supine strengthening and postural exercises performed to reduce lumbar pain. Patient has high level of instability due to contracting pain in back causing spasms mid stride. Patient would benefit from skilled physical therapy to improve stability, increase strength and ambulation, and decrease fall risk to improve quality of life.    Rehab Potential  Fair    Clinical Impairments Affecting Rehab Potential  (+) age, family support, motivation (-) progressive weakening of LE's, difficutly with ADs due to height     PT Frequency  2x / week    PT Duration  6 weeks    PT Treatment/Interventions  ADLs/Self Care Home Management;Aquatic Therapy;Biofeedback;Electrical Stimulation;Traction;Functional mobility training;Stair training;Gait training;DME Instruction;Ultrasound;Moist Heat;Therapeutic activities;Therapeutic exercise;Neuromuscular re-education;Balance training;Manual techniques;Orthotic Fit/Training;Patient/family education;Compression bandaging;Passive range of motion;Dry needling;Vestibular;Taping;Vasopneumatic Device;Energy conservation    PT Next Visit Plan  review HEP, ankle stability, balance    PT Home Exercise Plan  see sheet    Consulted and Agree with Plan of Care  Patient       Patient will benefit from skilled therapeutic intervention in order to improve the following deficits and impairments:  Abnormal gait, Decreased activity tolerance, Decreased balance, Decreased knowledge of precautions, Decreased endurance, Decreased coordination, Decreased knowledge of use of DME, Decreased mobility, Decreased range of motion, Difficulty walking, Decreased strength, Hypomobility, Impaired flexibility, Impaired perceived functional ability, Impaired sensation, Postural dysfunction, Improper body mechanics  Visit Diagnosis: Other abnormalities of gait  and mobility  Repeated falls     Problem List Patient Active Problem List   Diagnosis Date Noted  . Chest congestion 07/20/2017  . Viral illness 07/20/2017  . Lower respiratory infection 07/12/2017  . Laryngitis, acute 07/12/2017  . Cough 07/12/2017  . Pleurisy 07/12/2017  . Depression 06/03/2015  . HTN (hypertension) 01/02/2015  . Hyperlipidemia 01/02/2015  . 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 Sleep apnea 09/21/2010  . Arthritis, degenerative 02/17/2009  . Hereditary and idiopathic peripheral neuropathy 11/21/2008  . AD (atopic dermatitis) 07/08/2008  . Acid reflux 04/05/2007   Janna Arch, PT, DPT   01/12/2018, 4:00 PM  [place] MAIN [facility] SERVICES [address] [place], [place], [address] Phone: [phone]   Fax:  [phone]  Name: Nathan Dean MRN: [id number] Date of Birth: 12-29-1955

## 2018-01-17 ENCOUNTER — Ambulatory Visit: Payer: Managed Care, Other (non HMO) | Attending: Podiatry

## 2018-01-17 DIAGNOSIS — R296 Repeated falls: Secondary | ICD-10-CM

## 2018-01-17 DIAGNOSIS — R2689 Other abnormalities of gait and mobility: Secondary | ICD-10-CM | POA: Diagnosis not present

## 2018-01-17 NOTE — Therapy (Signed)
[place] MAIN [facility] SERVICES [address] [place], [place], [address] Phone: [phone]   Fax:  [phone]  Physical Therapy Treatment  Patient Details  Name: Nathan Dean Code MRN: [id number] Date of Birth: 04/02/1956 Referring Provider: Dr. Roma Schanz    Encounter Date: 01/17/2018  PT End of Session - 01/17/18 1402    Visit Number  8    Number of Visits  18    Date for PT Re-Evaluation  02/14/18    Authorization Type  8/10     PT Start Time  1347    PT Stop Time  1430    PT Time Calculation (min)  43 min    Equipment Utilized During Treatment  Gait belt    Activity Tolerance  Patient tolerated treatment well    Behavior During Therapy  [facility] for tasks assessed/performed       Past Medical History:  Diagnosis Date  . Diabetes ([place])   . GERD (gastroesophageal reflux disease)   . Hyperlipidemia   . Hypertension   . Sleep apnea     Past Surgical History:  Procedure Laterality Date  . CARPAL TUNNEL RELEASE Right 12/28/2016   Procedure: RIGHT ULNAR AND MEDIAN NEUROPLASTY AT WRIST;  Surgeon: Milly Jakob, MD;  Location: [place];  Service: Orthopedics;  Laterality: Right;  . REPLACEMENT TOTAL KNEE BILATERAL  2014   left 2012 rt 2014    There were no vitals filed for this visit.  Subjective Assessment - 01/17/18 1351    Subjective  Patient continuing to have higher back pain with VAS 8/10 and current 4/10. Felt relief after last session for a day and half prior to pain returning limiting his stability     Pertinent History  Patient reports that for the last year he has fallen about six times. Right ankle has been rolling. Having difficulty getting up from the floor. Has been having difficulty walking for the past 3-4 years.  Has diabetic neuropathy in both feet, can feel pressure but not sensation. Wears diabetic shoes but not pressure socks. Has had both knees replaced (L 5 years ago, R 3 years ago-remake). Would like to  be more steady and decrease falls risk. Would like a quad cane but can't find one with his height.     Limitations  Sitting;Standing;Walking;House hold activities;Other (comment)    How long can you sit comfortably?  hard chair less than an hour    How long can you stand comfortably?  almost immediate    How long can you walk comfortably?  almost immediately     Patient Stated Goals  walking better and having better balance,     Currently in Pain?  Yes    Pain Score  4     Pain Location  Back    Pain Orientation  Lower;Left    Pain Descriptors / Indicators  Aching;Spasm    Pain Type  Acute pain    Pain Onset  1 to 4 weeks ago    Pain Frequency  Constant       LE rotation 5 minutes to reduce back pain   Supine: hooklying with roll under knees for back support   TrA activation with cues for contraction and placement of hands 12 x 3 second holds.  TrA activation with cues for contraction and placement UE raises  To 90 degrees. Verbal cues for contraction   Supine: hooklying Adduction squeezes ball 10x 3 seconds ; 2 sets  GTB abduction  15x ; 2 sets    Posterior pelvic tilt 10x 3 second holds    Distraction 3x 15 seconds    Hamstring stretch 2x 60 seconds each leg   Adduction frog stretch x 30 seconds    Ultrasounds on L low pelvic region: 2 cm head freuency: 1 Mhz, duty cycle 100%, Amplitude 1.5 w/cm; time 8 minutes      pain reduced to 2/10                      PT Education - 01/17/18 1401    Education Details  back pain control, exercise technique     Person(s) Educated  Patient    Methods  Explanation;Demonstration;Verbal cues    Comprehension  Verbalized understanding;Returned demonstration       PT Short Term Goals - 01/03/18 1352      PT SHORT TERM GOAL #1   Title  Patient will be independent in home exercise program to improve strength/mobility for better functional independence with ADLs.    Baseline  HEP compliant    Time  2    Period   Weeks    Status  Partially Met      PT SHORT TERM GOAL #2   Title  Patient will perform STS from chair where knees are at 90 90 without UE support to increase mobility     Baseline  needs to use arms    Time  2    Period  Weeks    Status  Partially Met      PT SHORT TERM GOAL #3   Title   Patient will deny any falls over past 2 weeks to demonstrate improved safety awareness at home and work    Baseline  fell 2x over kitten    Time  2    Period  Weeks    Status  On-going        PT Long Term Goals - 01/03/18 [place] #1   Title  Patient will increase Berg Balance score by > 6 points (38/56) to demonstrate decreased fall risk during functional activities.    Baseline  7/25: 32/56 8/20: 35/56     Time  6    Period  Weeks    Status  Partially Met    Target Date  02/14/18      PT LONG TERM GOAL #2   Title  Patient will increase ABC scale score >60% to demonstrate better functional mobility and better confidence with ADLs.     Baseline  7/25: 35.6% 8/20: 36.9%     Time  6    Period  Weeks    Status  Partially Met    Target Date  02/14/18      PT LONG TERM GOAL #3   Title  Patient will be modified independent in walking on even/uneven surface with least restrictive assistive device, for 20+ minutes without rest break, reporting some difficulty or less to improve walking tolerance with community ambulation including grocery shopping, going to church,etc.     Baseline  able to walk 5 minutes before feeling unsteady.     Time  6    Period  Weeks    Status  Partially Met    Target Date  02/14/18      PT LONG TERM GOAL #4   Title  Patient (> 7 years old) will complete five times sit to stand test in < 15 seconds  indicating an increased LE strength and improved balance.    Baseline  7/25: 31 seconds with excessive UE support : 8/20: 27 seconds with decreased UE support     Time  6    Period  Weeks    Status  Partially Met    Target Date  02/14/18             Plan - 01/17/18 1406    Clinical Impression Statement  Patient continues to have prolonged back pain that is limiting his stability resulting in poor control and occasional LOB. Focus on reducing back pain allowed for improved mobility and stability. Patient would benefit from skilled physical therapy to improve stability, increase strength and ambulation, and decrease fall risk to improve quality of life.    Rehab Potential  Fair    Clinical Impairments Affecting Rehab Potential  (+) age, family support, motivation (-) progressive weakening of LE's, difficutly with ADs due to height     PT Frequency  2x / week    PT Duration  6 weeks    PT Treatment/Interventions  ADLs/Self Care Home Management;Aquatic Therapy;Biofeedback;Electrical Stimulation;Traction;Functional mobility training;Stair training;Gait training;DME Instruction;Ultrasound;Moist Heat;Therapeutic activities;Therapeutic exercise;Neuromuscular re-education;Balance training;Manual techniques;Orthotic Fit/Training;Patient/family education;Compression bandaging;Passive range of motion;Dry needling;Vestibular;Taping;Vasopneumatic Device;Energy conservation    PT Next Visit Plan  review HEP, ankle stability, balance    PT Home Exercise Plan  see sheet    Consulted and Agree with Plan of Care  Patient       Patient will benefit from skilled therapeutic intervention in order to improve the following deficits and impairments:  Abnormal gait, Decreased activity tolerance, Decreased balance, Decreased knowledge of precautions, Decreased endurance, Decreased coordination, Decreased knowledge of use of DME, Decreased mobility, Decreased range of motion, Difficulty walking, Decreased strength, Hypomobility, Impaired flexibility, Impaired perceived functional ability, Impaired sensation, Postural dysfunction, Improper body mechanics  Visit Diagnosis: Other abnormalities of gait and mobility  Repeated falls     Problem  List Patient Active Problem List   Diagnosis Date Noted  . Chest congestion 07/20/2017  . Viral illness 07/20/2017  . Lower respiratory infection 07/12/2017  . Laryngitis, acute 07/12/2017  . Cough 07/12/2017  . Pleurisy 07/12/2017  . Depression 06/03/2015  . HTN (hypertension) 01/02/2015  . Hyperlipidemia 01/02/2015  . 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 Sleep apnea 09/21/2010  . Arthritis, degenerative 02/17/2009  . Hereditary and idiopathic peripheral neuropathy 11/21/2008  . AD (atopic dermatitis) 07/08/2008  . Acid reflux 04/05/2007   Janna Arch, PT, DPT   01/17/2018, 2:41 PM  [place] MAIN [facility] SERVICES [address] [place], [place], [address] Phone: [phone]   Fax:  [phone]  Name: Aiden Helzer MRN: [id number] Date of Birth: [birth year]

## 2018-01-19 ENCOUNTER — Ambulatory Visit: Payer: Managed Care, Other (non HMO)

## 2018-01-19 DIAGNOSIS — R2689 Other abnormalities of gait and mobility: Secondary | ICD-10-CM

## 2018-01-19 DIAGNOSIS — R296 Repeated falls: Secondary | ICD-10-CM

## 2018-01-19 NOTE — Therapy (Signed)
[place] MAIN [facility] SERVICES [address] [place], [place], [address] Phone: [phone]   Fax:  [phone]  Physical Therapy Treatment  Patient Details  Name: Nathan Dean MRN: [id number] Date of Birth: September 05, 1955 Referring Provider: Dr. Roma Schanz    Encounter Date: 01/19/2018  PT End of Session - 01/19/18 1402    Visit Number  9    Number of Visits  18    Date for PT Re-Evaluation  02/14/18    Authorization Type  9/10     PT Start Time  1344    PT Stop Time  1429    PT Time Calculation (min)  45 min    Equipment Utilized During Treatment  Gait belt    Activity Tolerance  Patient tolerated treatment well    Behavior During Therapy  [facility] for tasks assessed/performed       Past Medical History:  Diagnosis Date  . Diabetes ([place])   . GERD (gastroesophageal reflux disease)   . Hyperlipidemia   . Hypertension   . Sleep apnea     Past Surgical History:  Procedure Laterality Date  . CARPAL TUNNEL RELEASE Right 12/28/2016   Procedure: RIGHT ULNAR AND MEDIAN NEUROPLASTY AT WRIST;  Surgeon: Milly Jakob, MD;  Location: [place];  Service: Orthopedics;  Laterality: Right;  . REPLACEMENT TOTAL KNEE BILATERAL  2014   left 2012 rt 2014    There were no vitals filed for this visit.  Subjective Assessment - 01/19/18 1348    Subjective  Patient reports that back pain is 4/10. Went to chiropractic yesterday and felt better and went home and iced it. Patient came back an hour after spasm came on.     Pertinent History  Patient reports that for the last year he has fallen about six times. Right ankle has been rolling. Having difficulty getting up from the floor. Has been having difficulty walking for the past 3-4 years.  Has diabetic neuropathy in both feet, can feel pressure but not sensation. Wears diabetic shoes but not pressure socks. Has had both knees replaced (L 5 years ago, R 3 years ago-remake). Would like to be more  steady and decrease falls risk. Would like a quad cane but can't find one with his height.     Limitations  Sitting;Standing;Walking;House hold activities;Other (comment)    How long can you sit comfortably?  hard chair less than an hour    How long can you stand comfortably?  almost immediate    How long can you walk comfortably?  almost immediately     Patient Stated Goals  walking better and having better balance,     Currently in Pain?  Yes    Pain Score  4     Pain Location  Back    Pain Orientation  Lower;Left    Pain Descriptors / Indicators  Aching;Spasm    Pain Type  Acute pain    Pain Onset  1 to 4 weeks ago    Pain Frequency  Constant        LE rotation 5 minutes to reduce back pain   Supine: hooklying with roll under knees for back support   TrA activation with cues for contraction and placement of hands 12 x 3 second holds.  TrA contraction with marches 10x each leg .  TrA activation with cues for contraction and placement UE raises  To 90 degrees. Verbal cues for contraction  TrA activation with 4lb bar raises.  Supine: hooklying Adduction squeezes ball 10x 3 seconds ; 2 sets  GTB abduction 15x ; 2 sets    Posterior pelvic tilt 10x 5 second holds     Hamstring stretch 2x 60 seconds each leg   Adduction frog stretch x 30 seconds   R calf rollout due to cramping. Noted   Ultrasounds on L low pelvic region: 2 cm head frequency: 1 Mhz, duty cycle 100%, Amplitude 1.5 w/cm; time 10 minutes                         PT Education - 01/19/18 1402    Education Details  back pain control, exercise technique     Person(s) Educated  Patient    Methods  Explanation;Demonstration;Verbal cues    Comprehension  Verbalized understanding;Returned demonstration       PT Short Term Goals - 01/03/18 1352      PT SHORT TERM GOAL #1   Title  Patient will be independent in home exercise program to improve strength/mobility for better functional  independence with ADLs.    Baseline  HEP compliant    Time  2    Period  Weeks    Status  Partially Met      PT SHORT TERM GOAL #2   Title  Patient will perform STS from chair where knees are at 90 90 without UE support to increase mobility     Baseline  needs to use arms    Time  2    Period  Weeks    Status  Partially Met      PT SHORT TERM GOAL #3   Title   Patient will deny any falls over past 2 weeks to demonstrate improved safety awareness at home and work    Baseline  fell 2x over kitten    Time  2    Period  Weeks    Status  On-going        PT Long Term Goals - 01/03/18 [place] #1   Title  Patient will increase Berg Balance score by > 6 points (38/56) to demonstrate decreased fall risk during functional activities.    Baseline  7/25: 32/56 8/20: 35/56     Time  6    Period  Weeks    Status  Partially Met    Target Date  02/14/18      PT LONG TERM GOAL #2   Title  Patient will increase ABC scale score >60% to demonstrate better functional mobility and better confidence with ADLs.     Baseline  7/25: 35.6% 8/20: 36.9%     Time  6    Period  Weeks    Status  Partially Met    Target Date  02/14/18      PT LONG TERM GOAL #3   Title  Patient will be modified independent in walking on even/uneven surface with least restrictive assistive device, for 20+ minutes without rest break, reporting some difficulty or less to improve walking tolerance with community ambulation including grocery shopping, going to church,etc.     Baseline  able to walk 5 minutes before feeling unsteady.     Time  6    Period  Weeks    Status  Partially Met    Target Date  02/14/18      PT LONG TERM GOAL #4   Title  Patient (> 63 years old) will complete  five times sit to stand test in < 15 seconds indicating an increased LE strength and improved balance.    Baseline  7/25: 31 seconds with excessive UE support : 8/20: 27 seconds with decreased UE support     Time  6     Period  Weeks    Status  Partially Met    Target Date  02/14/18            Plan - 01/19/18 1405    Clinical Impression Statement  Patient's back pain continues to limit his ambulation and stability. Patient has increased spasm of R calf requiring rollout to reduce pain. Focus on reducing back pain allowed for improved mobility and stability. Patient would benefit from skilled physical therapy to improve stability, increase strength and ambulation, and decrease fall risk to improve quality of life.    Rehab Potential  Fair    Clinical Impairments Affecting Rehab Potential  (+) age, family support, motivation (-) progressive weakening of LE's, difficutly with ADs due to height     PT Frequency  2x / week    PT Duration  6 weeks    PT Treatment/Interventions  ADLs/Self Care Home Management;Aquatic Therapy;Biofeedback;Electrical Stimulation;Traction;Functional mobility training;Stair training;Gait training;DME Instruction;Ultrasound;Moist Heat;Therapeutic activities;Therapeutic exercise;Neuromuscular re-education;Balance training;Manual techniques;Orthotic Fit/Training;Patient/family education;Compression bandaging;Passive range of motion;Dry needling;Vestibular;Taping;Vasopneumatic Device;Energy conservation    PT Next Visit Plan  review HEP, ankle stability, balance    PT Home Exercise Plan  see sheet    Consulted and Agree with Plan of Care  Patient       Patient will benefit from skilled therapeutic intervention in order to improve the following deficits and impairments:  Abnormal gait, Decreased activity tolerance, Decreased balance, Decreased knowledge of precautions, Decreased endurance, Decreased coordination, Decreased knowledge of use of DME, Decreased mobility, Decreased range of motion, Difficulty walking, Decreased strength, Hypomobility, Impaired flexibility, Impaired perceived functional ability, Impaired sensation, Postural dysfunction, Improper body mechanics  Visit  Diagnosis: Other abnormalities of gait and mobility  Repeated falls     Problem List Patient Active Problem List   Diagnosis Date Noted  . Chest congestion 07/20/2017  . Viral illness 07/20/2017  . Lower respiratory infection 07/12/2017  . Laryngitis, acute 07/12/2017  . Cough 07/12/2017  . Pleurisy 07/12/2017  . Depression 06/03/2015  . HTN (hypertension) 01/02/2015  . Hyperlipidemia 01/02/2015  . 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 Sleep apnea 09/21/2010  . Arthritis, degenerative 02/17/2009  . Hereditary and idiopathic peripheral neuropathy 11/21/2008  . AD (atopic dermatitis) 07/08/2008  . Acid reflux 04/05/2007   Janna Arch, PT, DPT   01/19/2018, 2:29 PM  [place] MAIN [facility] SERVICES [address] [place], [place], [address] Phone: [phone]   Fax:  [phone]  Name: Gianlucca Szymborski MRN: [id number] Date of Birth: 02-06-1956

## 2018-01-23 ENCOUNTER — Ambulatory Visit: Payer: Managed Care, Other (non HMO)

## 2018-01-27 ENCOUNTER — Ambulatory Visit: Payer: Managed Care, Other (non HMO)

## 2018-01-27 DIAGNOSIS — R2689 Other abnormalities of gait and mobility: Secondary | ICD-10-CM | POA: Diagnosis not present

## 2018-01-27 DIAGNOSIS — R296 Repeated falls: Secondary | ICD-10-CM
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
## 2018-01-28 ENCOUNTER — Other Ambulatory Visit: Payer: Self-pay | Admitting: Family Medicine

## 2018-01-28 DIAGNOSIS — E1140 Type 2 diabetes mellitus with diabetic neuropathy, unspecified: Secondary | ICD-10-CM

## 2018-01-28 DIAGNOSIS — Z794 Long term (current) use of insulin: Principal | ICD-10-CM

## 2018-01-30 NOTE — Telephone Encounter (Signed)
Gabapentin 300 mg  refill Last Refill: 12/28/17 # 180 Last OV: 09/06/17 PCP: Claudell KyleM. [place] [facility]acy: [facility] [phone]  .

## 2018-01-31 ENCOUNTER — Ambulatory Visit: Payer: Managed Care, Other (non HMO)

## 2018-01-31 ENCOUNTER — Other Ambulatory Visit: Payer: Self-pay | Admitting: Emergency Medicine

## 2018-01-31 DIAGNOSIS — Z794 Long term (current) use of insulin: Principal | ICD-10-CM

## 2018-01-31 DIAGNOSIS — E1140 Type 2 diabetes mellitus with diabetic neuropathy, unspecified: Secondary | ICD-10-CM

## 2018-02-01 ENCOUNTER — Other Ambulatory Visit: Payer: Self-pay | Admitting: Emergency Medicine

## 2018-02-01 ENCOUNTER — Ambulatory Visit: Payer: Managed Care, Other (non HMO)

## 2018-02-01 DIAGNOSIS — R2689 Other abnormalities of gait and mobility: Secondary | ICD-10-CM | POA: Diagnosis not present

## 2018-02-01 DIAGNOSIS — Z794 Long term (current) use of insulin: Principal | ICD-10-CM

## 2018-02-01 DIAGNOSIS — R296 Repeated falls: Secondary | ICD-10-CM

## 2018-02-01 DIAGNOSIS — E1140 Type 2 diabetes mellitus with diabetic neuropathy, unspecified: Secondary | ICD-10-CM

## 2018-02-01 NOTE — Therapy (Signed)
[place] MAIN [facility] SERVICES [address] [place], [place], [address] Phone: [phone]   Fax:  [phone]  Physical Therapy Treatment  Patient Details  Name: Nathan Dean MRN: [id number] Date of Birth: 1955/11/03 Referring Provider: Dr. Roma Schanz    Encounter Date: 02/01/2018  PT End of Session - 02/01/18 1341    Visit Number  11    Number of Visits  18    Date for PT Re-Evaluation  02/14/18    Authorization Type  1/10 start 9/13    PT Start Time  1346    PT Stop Time  1430    PT Time Calculation (min)  44 min    Equipment Utilized During Treatment  Gait belt    Activity Tolerance  Patient tolerated treatment well;Patient limited by pain    Behavior During Therapy  [facility] for tasks assessed/performed       Past Medical History:  Diagnosis Date  . Diabetes ([place])   . GERD (gastroesophageal reflux disease)   . Hyperlipidemia   . Hypertension   . Sleep apnea     Past Surgical History:  Procedure Laterality Date  . CARPAL TUNNEL RELEASE Right 12/28/2016   Procedure: RIGHT ULNAR AND MEDIAN NEUROPLASTY AT WRIST;  Surgeon: Milly Jakob, MD;  Location: [place];  Service: Orthopedics;  Laterality: Right;  . REPLACEMENT TOTAL KNEE BILATERAL  2014   left 2012 rt 2014    There were no vitals filed for this visit.  Subjective Assessment - 02/01/18 1352    Subjective  Patient reports feeling good no LOB or falls since last  session. Has been having multiple appointments for job interviews.     Pertinent History  Patient reports that for the last year he has fallen about six times. Right ankle has been rolling. Having difficulty getting up from the floor. Has been having difficulty walking for the past 3-4 years.  Has diabetic neuropathy in both feet, can feel pressure but not sensation. Wears diabetic shoes but not pressure socks. Has had both knees replaced (L 5 years ago, R 3 years ago-remake). Would like to be more  steady and decrease falls risk. Would like a quad cane but can't find one with his height.     Limitations  Sitting;Standing;Walking;House hold activities;Other (comment)    How long can you sit comfortably?  hard chair less than an hour    How long can you stand comfortably?  almost immediate    How long can you walk comfortably?  almost immediately     Patient Stated Goals  walking better and having better balance,     Currently in Pain?  Yes    Pain Score  5     Pain Location  Hip    Pain Orientation  Lower;Left    Pain Descriptors / Indicators  Aching;Spasm    Pain Onset  1 to 4 weeks ago    Pain Frequency  Constant        sit to stand from plinth table with decreasing height. ; cues for abdominal activation, foot placement and trunk control 5 x 130 degree angle, 5 x 115 degree ankle, 2 attempts ; one trial of utilizing one hand cane one hand plinth.     Seated BAPs board 10 circles clockwise, 10 circles counterclockwise   Airex pad static stand. Stand 60 seconds two touches for stability with no UE support      Tandem stance in // bars 30 seconds  each leg: requires modification and UE support    Seated R ankle ABC (whole alphabet, two rest breaks, cues for keeping feet off the ground) (added to HEP) : performed on L too.    GTB Eversion 10x R leg; 1 sets hold 3 seconds GTB inversion 10x R leg; 1 sets 3 second hold    [place]ble R foot pickup. 10 marbles (added to HEP)      LAQ 10x 3 second holds ; musculature shaking   Seated adduction isometric against PT 10x 3 second holds  Seated adduction with ball between feet and LAQ 10x                           PT Education - 02/01/18 1340    Education Details  exercise technique, stability     Person(s) Educated  Patient    Methods  Explanation;Demonstration;Verbal cues    Comprehension  Verbalized understanding;Returned demonstration       PT Short Term Goals - 01/27/18 2107      PT SHORT TERM GOAL #1    Title  Patient will be independent in home exercise program to improve strength/mobility for better functional independence with ADLs.    Baseline  HEP compliant    Time  2    Period  Weeks    Status  Partially Met      PT SHORT TERM GOAL #2   Title  Patient will perform STS from chair where knees are at 90 90 without UE support to increase mobility     Baseline  able to perform 1x     Time  2    Period  Weeks    Status  Partially Met      PT SHORT TERM GOAL #3   Title   Patient will deny any falls over past 2 weeks to demonstrate improved safety awareness at home and work    Baseline  fell 2x over kitten    Time  2    Period  Weeks    Status  On-going        PT Long Term Goals - 01/27/18 1056      PT [place] #1   Title  Patient will increase Berg Balance score by > 6 points (38/56) to demonstrate decreased fall risk during functional activities.    Baseline  7/25: 32/56 8/20: 35/56 9/13: 34/56    Time  6    Period  Weeks    Status  Partially Met      PT LONG TERM GOAL #2   Title  Patient will increase ABC scale score >60% to demonstrate better functional mobility and better confidence with ADLs.     Baseline  7/25: 35.6% 8/20: 36.9% 9/13: 52.5%    Time  6    Period  Weeks    Status  On-going      PT LONG TERM GOAL #3   Title  Patient will be modified independent in walking on even/uneven surface with least restrictive assistive device, for 20+ minutes without rest break, reporting some difficulty or less to improve walking tolerance with community ambulation including grocery shopping, going to church,etc.     Baseline  able to walk 5 minutes before feeling unsteady.     Time  6    Period  Weeks    Status  Partially Met      PT LONG TERM GOAL #4   Title  Patient (>  70 years old) will complete five times sit to stand test in < 15 seconds indicating an increased LE strength and improved balance.    Baseline  7/25: 31 seconds with excessive UE support : 8/20:  27 seconds with decreased UE support 9/13: 34 seconds with muscle guarding    Time  6    Period  Weeks    Status  Partially Met            Plan - 02/01/18 1405    Clinical Impression Statement  Patient presents with improved muscle activation and coordination however continues to rely upon UE support. Patient challenged with stability without UE support due to fear of LOB and fear of single limb stance. Patient would benefit from skilled physical therapy to improve stability, increase strength and ambulation, and decrease fall risk to improve quality of life.    Rehab Potential  Fair    Clinical Impairments Affecting Rehab Potential  (+) age, family support, motivation (-) progressive weakening of LE's, difficutly with ADs due to height     PT Frequency  2x / week    PT Duration  6 weeks    PT Treatment/Interventions  ADLs/Self Care Home Management;Aquatic Therapy;Biofeedback;Electrical Stimulation;Traction;Functional mobility training;Stair training;Gait training;DME Instruction;Ultrasound;Moist Heat;Therapeutic activities;Therapeutic exercise;Neuromuscular re-education;Balance training;Manual techniques;Orthotic Fit/Training;Patient/family education;Compression bandaging;Passive range of motion;Dry needling;Vestibular;Taping;Vasopneumatic Device;Energy conservation    PT Next Visit Plan  review HEP, ankle stability, balance    PT Home Exercise Plan  see sheet    Consulted and Agree with Plan of Care  Patient       Patient will benefit from skilled therapeutic intervention in order to improve the following deficits and impairments:  Abnormal gait, Decreased activity tolerance, Decreased balance, Decreased knowledge of precautions, Decreased endurance, Decreased coordination, Decreased knowledge of use of DME, Decreased mobility, Decreased range of motion, Difficulty walking, Decreased strength, Hypomobility, Impaired flexibility, Impaired perceived functional ability, Impaired sensation,  Postural dysfunction, Improper body mechanics  Visit Diagnosis: Other abnormalities of gait and mobility  Repeated falls     Problem List Patient Active Problem List   Diagnosis Date Noted  . Chest congestion 07/20/2017  . Viral illness 07/20/2017  . Lower respiratory infection 07/12/2017  . Laryngitis, acute 07/12/2017  . Cough 07/12/2017  . Pleurisy 07/12/2017  . Depression 06/03/2015  . HTN (hypertension) 01/02/2015  . Hyperlipidemia 01/02/2015  . 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 Sleep apnea 09/21/2010  . Arthritis, degenerative 02/17/2009  . Hereditary and idiopathic peripheral neuropathy 11/21/2008  . AD (atopic dermatitis) 07/08/2008  . Acid reflux 04/05/2007   Janna Arch, PT, DPT   02/01/2018, 2:30 PM  [place] MAIN [facility] SERVICES [address] [place], [place], [address] Phone: [phone]   Fax:  [phone]  Name: Nathan Dean MRN: [id number] Date of Birth: October 22, 1955

## 2018-02-06 ENCOUNTER — Ambulatory Visit: Payer: Managed Care, Other (non HMO)

## 2018-02-08 ENCOUNTER — Ambulatory Visit: Payer: Managed Care, Other (non HMO)

## 2018-02-14 ENCOUNTER — Ambulatory Visit: Payer: Managed Care, Other (non HMO)

## 2018-02-16 ENCOUNTER — Ambulatory Visit: Payer: Managed Care, Other (non HMO) | Attending: Podiatry

## 2018-02-16 DIAGNOSIS — R296 Repeated falls: Secondary | ICD-10-CM | POA: Insufficient documentation

## 2018-02-16 DIAGNOSIS — R2689 Other abnormalities of gait and mobility: Secondary | ICD-10-CM | POA: Insufficient documentation

## 2018-02-16 NOTE — Therapy (Signed)
[place] MAIN [facility] SERVICES [address] [place], [place], [address] Phone: [phone]   Fax:  [phone]  Physical Therapy Treatment/Discharge  Patient Details  Name: Nathan Dean MRN: [id number] Date of Birth: 03-Apr-1956 Referring Provider (PT): Dr. Roma Schanz    Encounter Date: 02/16/2018  PT End of Session - 02/16/18 1450    Visit Number  12    Number of Visits  18    Date for PT Re-Evaluation  02/16/18    Authorization Type  2/10 start 9/13    PT Start Time  1346    PT Stop Time  1426    PT Time Calculation (min)  40 min    Equipment Utilized During Treatment  Gait belt    Activity Tolerance  Patient tolerated treatment well    Behavior During Therapy  [facility] for tasks assessed/performed       Past Medical History:  Diagnosis Date  . Diabetes ([place])   . GERD (gastroesophageal reflux disease)   . Hyperlipidemia   . Hypertension   . Sleep apnea     Past Surgical History:  Procedure Laterality Date  . CARPAL TUNNEL RELEASE Right 12/28/2016   Procedure: RIGHT ULNAR AND MEDIAN NEUROPLASTY AT WRIST;  Surgeon: Milly Jakob, MD;  Location: [place];  Service: Orthopedics;  Laterality: Right;  . REPLACEMENT TOTAL KNEE BILATERAL  2014   left 2012 rt 2014    There were no vitals filed for this visit.  Subjective Assessment - 02/16/18 1448    Subjective  Patient reports he has been struggeling lately. His 63 year old dog he has had since he was a puppy passed away Aug 10, 2022. Patient additionally became ill earlier this week  and continues to struggle job hunting. Reports he needs to discontinue therpay due to not being able to afford copay anymore.     Pertinent History  Patient reports that for the last year he has fallen about six times. Right ankle has been rolling. Having difficulty getting up from the floor. Has been having difficulty walking for the past 3-4 years.  Has diabetic neuropathy in both feet, can feel  pressure but not sensation. Wears diabetic shoes but not pressure socks. Has had both knees replaced (L 5 years ago, R 3 years ago-remake). Would like to be more steady and decrease falls risk. Would like a quad cane but can't find one with his height.     Limitations  Sitting;Standing;Walking;House hold activities;Other (comment)    How long can you sit comfortably?  hard chair less than an hour    How long can you stand comfortably?  almost immediate    How long can you walk comfortably?  almost immediately     Patient Stated Goals  walking better and having better balance,     Currently in Pain?  No/denies        Due to patient needing to discontinue therapy for financial reasons new HEP was performed to give patient options at home. Performed each of the below interventions demonstrating independence  Access Code: [email or web address]  URL: [email or web address]  Date: 02/16/2018  Prepared by: Janna Arch   Exercises  Seated Heel Toe Raises - 10 reps - 1 sets - 5 hold - 1x daily - 7x weekly  Seated Ankle Alphabet - 10 reps - 1 sets - 5 hold - 1x daily - 7x weekly  Seated Long Arc Quad - 10 reps - 1 sets - 5 hold - 1x daily -  7x weekly  Seated Isometric Hip Adduction with Pelvic Floor Contraction - 10 reps - 1 sets - 5 hold - 1x daily - 7x weekly  Wide Tandem Stance with Eyes Open - 10 reps - 1 sets - 30 hold - 1x daily - 7x weekly  Standing Narrow Base of Support - 10 reps - 1 sets - 30 hold - 1x daily - 7x weekly  Standing Marching - 10 reps - 1 sets - 5 hold - 1x daily - 7x weekly  Standing Hip Extension with Counter Support - 10 reps - 1 sets - 5 hold - 1x daily - 7x weekly  Standing Hip Abduction with Counter Support - 10 reps - 1 sets - 5 hold - 1x daily - 7x weekly  Sit to Stand - 10 reps - 1 sets - 5 hold - 1x daily - 7x weekly  Supine Transversus Abdominis Bracing - Hands on Stomach - 10 reps - 1 sets - 5 hold - 1x daily - 7x weekly  Supine Lower Trunk Rotation - 10 reps -  1 sets - 5 hold - 1x daily - 7x weekly  Supine Bridge - 10 reps - 1 sets - 5 hold - 1x daily - 7x weekly  Supine Hip Adduction Isometric with Ball - 10 reps - 1 sets - 5 hold - 1x daily - 7x weekly  Hooklying Isometric Clamshell - 10 reps - 1 sets - 5 hold - 1x daily - 7x weekly                           PT Education - 02/16/18 1449    Education Details  new HEP, d/c    Person(s) Educated  Patient    Methods  Explanation;Demonstration;Verbal cues;Handout;Tactile cues    Comprehension  Verbalized understanding;Returned demonstration       PT Short Term Goals - 02/16/18 1454      PT SHORT TERM GOAL #1   Title  Patient will be independent in home exercise program to improve strength/mobility for better functional independence with ADLs.    Baseline  HEP compliant    Time  2    Period  Weeks    Status  Achieved      PT SHORT TERM GOAL #2   Title  Patient will perform STS from chair where knees are at 90 90 without UE support to increase mobility     Baseline  able to perform 1x     Time  2    Period  Weeks    Status  Partially Met      PT SHORT TERM GOAL #3   Title   Patient will deny any falls over past 2 weeks to demonstrate improved safety awareness at home and work    Baseline  no falls    Time  2    Period  Weeks    Status  Achieved        PT Long Term Goals - 02/16/18 1455      PT LONG TERM GOAL #1   Title  Patient will increase Berg Balance score by > 6 points (38/56) to demonstrate decreased fall risk during functional activities.    Baseline  7/25: 32/56 8/20: 35/56 9/13: 34/56    Time  6    Period  Weeks    Status  Partially Met      PT LONG TERM GOAL #2   Title  Patient will increase  ABC scale score >60% to demonstrate better functional mobility and better confidence with ADLs.     Baseline  7/25: 35.6% 8/20: 36.9% 9/13: 52.5%    Time  6    Period  Weeks    Status  On-going      PT LONG TERM GOAL #3   Title  Patient will be  modified independent in walking on even/uneven surface with least restrictive assistive device, for 20+ minutes without rest break, reporting some difficulty or less to improve walking tolerance with community ambulation including grocery shopping, going to church,etc.     Baseline  able to walk 5 minutes before feeling unsteady.     Time  6    Period  Weeks    Status  Partially Met      PT LONG TERM GOAL #4   Title  Patient (> 73 years old) will complete five times sit to stand test in < 15 seconds indicating an increased LE strength and improved balance.    Baseline  7/25: 31 seconds with excessive UE support : 8/20: 27 seconds with decreased UE support 9/13: 34 seconds with muscle guarding    Time  6    Period  Weeks    Status  Partially Met            Plan - 02/16/18 1454    Clinical Impression Statement  Patient presents to therapy after having multiple challenges/events occur including: death of beloved pet, severe illness, and continue job hunt/unemployed. Due to patient needing to discontinue therapy for financial reasons new HEP was performed to give patient options at home. Patient demonstrated understanding of HEP as well as implementation of walking program on days he is not utilizing his bike. Goals were not re-assessed due to being performed two sessions prior.  I will be happy to see the patient again in the future as needed.     Rehab Potential  Fair    Clinical Impairments Affecting Rehab Potential  (+) age, family support, motivation (-) progressive weakening of LE's, difficutly with ADs due to height     PT Frequency  One time visit    PT Treatment/Interventions  ADLs/Self Care Home Management;Aquatic Therapy;Biofeedback;Electrical Stimulation;Traction;Functional mobility training;Stair training;Gait training;DME Instruction;Ultrasound;Moist Heat;Therapeutic activities;Therapeutic exercise;Neuromuscular re-education;Balance training;Manual techniques;Orthotic  Fit/Training;Patient/family education;Compression bandaging;Passive range of motion;Dry needling;Vestibular;Taping;Vasopneumatic Device;Energy conservation    PT Home Exercise Plan  see sheet    Consulted and Agree with Plan of Care  Patient       Patient will benefit from skilled therapeutic intervention in order to improve the following deficits and impairments:  Abnormal gait, Decreased activity tolerance, Decreased balance, Decreased knowledge of precautions, Decreased endurance, Decreased coordination, Decreased knowledge of use of DME, Decreased mobility, Decreased range of motion, Difficulty walking, Decreased strength, Hypomobility, Impaired flexibility, Impaired perceived functional ability, Impaired sensation, Postural dysfunction, Improper body mechanics  Visit Diagnosis: Other abnormalities of gait and mobility  Repeated falls     Problem List Patient Active Problem List   Diagnosis Date Noted  . Chest congestion 07/20/2017  . Viral illness 07/20/2017  . Lower respiratory infection 07/12/2017  . Laryngitis, acute 07/12/2017  . Cough 07/12/2017  . Pleurisy 07/12/2017  . Depression 06/03/2015  . HTN (hypertension) 01/02/2015  . Hyperlipidemia 01/02/2015  . 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 Sleep apnea 09/21/2010  . Arthritis, degenerative 02/17/2009  . Hereditary and idiopathic peripheral neuropathy 11/21/2008  . AD (atopic dermatitis) 07/08/2008  .  Acid reflux 04/05/2007   Janna Arch, PT, DPT   02/16/2018, 2:56 PM  [place] MAIN [facility] SERVICES [address] [place], [place], [address] Phone: [phone]   Fax:  [phone]  Name: Omarr Hann MRN: [id number] Date of Birth: Aug 23, 1955

## 2018-02-16 NOTE — Patient Instructions (Signed)
Access Code: [email or web address]  URL: [email or web address]  Date: 02/16/2018  Prepared by: Precious Bard   Exercises  Seated Heel Toe Raises - 10 reps - 1 sets - 5 hold - 1x daily - 7x weekly  Seated Ankle Alphabet - 10 reps - 1 sets - 5 hold - 1x daily - 7x weekly  Seated Long Arc Quad - 10 reps - 1 sets - 5 hold - 1x daily - 7x weekly  Seated Isometric Hip Adduction with Pelvic Floor Contraction - 10 reps - 1 sets - 5 hold - 1x daily - 7x weekly  Wide Tandem Stance with Eyes Open - 10 reps - 1 sets - 30 hold - 1x daily - 7x weekly  Standing Narrow Base of Support - 10 reps - 1 sets - 30 hold - 1x daily - 7x weekly  Standing Marching - 10 reps - 1 sets - 5 hold - 1x daily - 7x weekly  Standing Hip Extension with Counter Support - 10 reps - 1 sets - 5 hold - 1x daily - 7x weekly  Standing Hip Abduction with Counter Support - 10 reps - 1 sets - 5 hold - 1x daily - 7x weekly  Sit to Stand - 10 reps - 1 sets - 5 hold - 1x daily - 7x weekly  Supine Transversus Abdominis Bracing - Hands on Stomach - 10 reps - 1 sets - 5 hold - 1x daily - 7x weekly  Supine Lower Trunk Rotation - 10 reps - 1 sets - 5 hold - 1x daily - 7x weekly  Supine Bridge - 10 reps - 1 sets - 5 hold - 1x daily - 7x weekly  Supine Hip Adduction Isometric with Ball - 10 reps - 1 sets - 5 hold - 1x daily - 7x weekly  Hooklying Isometric Clamshell - 10 reps - 1 sets - 5 hold - 1x daily - 7x weekly

## 2018-02-21 ENCOUNTER — Ambulatory Visit: Payer: Managed Care, Other (non HMO)

## 2018-02-26 ENCOUNTER — Encounter: Payer: Self-pay | Admitting: Emergency Medicine

## 2018-02-28 ENCOUNTER — Ambulatory Visit: Payer: Managed Care, Other (non HMO)

## 2018-03-01 ENCOUNTER — Encounter: Payer: Self-pay | Admitting: Emergency Medicine

## 2018-03-01 ENCOUNTER — Ambulatory Visit: Payer: Managed Care, Other (non HMO) | Admitting: Emergency Medicine

## 2018-03-01 VITALS — BP 158/88 | HR 92 | Temp 98.0°F | Resp 16 | Ht >= 80 in | Wt 374.0 lb

## 2018-03-01 DIAGNOSIS — M79641 Pain in right hand: Secondary | ICD-10-CM

## 2018-03-01 DIAGNOSIS — M79642 Pain in left hand: Secondary | ICD-10-CM | POA: Diagnosis not present

## 2018-03-01 DIAGNOSIS — M792 Neuralgia and neuritis, unspecified: Secondary | ICD-10-CM | POA: Diagnosis not present

## 2018-03-01 DIAGNOSIS — Z23 Encounter for immunization: Secondary | ICD-10-CM

## 2018-03-01 NOTE — Patient Instructions (Addendum)
   If you have lab work done today you will be contacted with your lab results within the next 2 weeks.  If you have not heard from us then please contact us. The fastest way to get your results is to register for [name].   IF you received an x-ray today, you will receive an invoice from [facility]. Please contact [facility] at [phone] with questions or concerns regarding your invoice.   IF you received labwork today, you will receive an invoice from [name]. Please contact [name] at [phone] with questions or concerns regarding your invoice.   Our billing staff will not be able to assist you with questions regarding bills from these companies.  You will be contacted with the lab results as soon as they are available. The fastest way to get your results is to activate your [name] account. Instructions are located on the last page of this paperwork. If you have not heard from us regarding the results in 2 weeks, please contact this office.     Neuropathic Pain Neuropathic pain is pain caused by damage to the nerves that are responsible for certain sensations in your body (sensory nerves). The pain can be caused by damage to:  The sensory nerves that send signals to your spinal cord and brain (peripheral nervous system).  The sensory nerves in your brain or spinal cord (central nervous system).  Neuropathic pain can make you more sensitive to pain. What would be a minor sensation for most people may feel very painful if you have neuropathic pain. This is usually a long-term condition that can be difficult to treat. The type of pain can differ from person to person. It may start suddenly (acute), or it may develop slowly and last for a long time (chronic). Neuropathic pain may come and go as damaged nerves heal or may stay at the same level for years. It often causes emotional distress, loss of sleep, and a lower quality of life. What are the causes? The  most common cause of damage to a sensory nerve is diabetes. Many other diseases and conditions can also cause neuropathic pain. Causes of neuropathic pain can be classified as:  Toxic. Many drugs and chemicals can cause toxic damage. The most common cause of toxic neuropathic pain is damage from drug treatment for cancer (chemotherapy).  Metabolic. This type of pain can happen when a disease causes imbalances that damage nerves. Diabetes is the most common of these diseases. Vitamin B deficiency caused by long-term alcohol abuse is another common cause.  Traumatic. Any injury that cuts, crushes, or stretches a nerve can cause damage and pain. A common example is feeling pain after losing an arm or leg (phantom limb pain).  Compression-related. If a sensory nerve gets trapped or compressed for a long period of time, the blood supply to the nerve can be cut off.  Vascular. Many blood vessel diseases can cause neuropathic pain by decreasing blood supply and oxygen to nerves.  Autoimmune. This type of pain results from diseases in which the body's defense system mistakenly attacks sensory nerves. Examples of autoimmune diseases that can cause neuropathic pain include lupus and multiple sclerosis.  Infectious. Many types of viral infections can damage sensory nerves and cause pain. Shingles infection is a common cause of this type of pain.  Inherited. Neuropathic pain can be a symptom of many diseases that are passed down through families (genetic).  What are the signs or symptoms? The main symptom   is pain. Neuropathic pain is often described as:  Burning.  Shock-like.  Stinging.  Hot or cold.  Itching.  How is this diagnosed? No single test can diagnose neuropathic pain. Your health care provider will do a physical exam and ask you about your pain. You may use a pain scale to describe how bad your pain is. You may also have tests to see if you have a high sensitivity to pain and to help  find the cause and location of any sensory nerve damage. These tests may include:  Imaging studies, such as: ? X-rays. ? CT scan. ? MRI.  Nerve conduction studies to test how well nerve signals travel through your sensory nerves (electrodiagnostic testing).  Stimulating your sensory nerves through electrodes on your skin and measuring the response in your spinal cord and brain (somatosensory evoked potentials).  How is this treated? Treatment for neuropathic pain may change over time. You may need to try different treatment options or a combination of treatments. Some options include:  Over-the-counter pain relievers.  Prescription medicines. Some medicines used to treat other conditions may also help neuropathic pain. These include medicines to: ? Control seizures (anticonvulsants). ? Relieve depression (antidepressants).  Prescription-strength pain relievers (narcotics). These are usually used when other pain relievers do not help.  Transcutaneous nerve stimulation (TENS). This uses electrical currents to block painful nerve signals. The treatment is painless.  Topical and local anesthetics. These are medicines that numb the nerves. They can be injected as a nerve block or applied to the skin.  Alternative treatments, such as: ? Acupuncture. ? Meditation. ? Massage. ? Physical therapy. ? Pain management programs. ? Counseling.  Follow these instructions at home:  Learn as much as you can about your condition.  Take medicines only as directed by your health care provider.  Work closely with all your health care providers to find what works best for you.  Have a good support system at home.  Consider joining a chronic pain support group. Contact a health care provider if:  Your pain treatments are not helping.  You are having side effects from your medicines.  You are struggling with fatigue, mood changes, depression, or anxiety. This information is not intended to  replace advice given to you by your health care provider. Make sure you discuss any questions you have with your health care provider. Document Released: 01/29/2004 Document Revised: 11/21/2015 Document Reviewed: 10/11/2013 [employer] Interactive Patient Education  2018 [employer]  

## 2018-03-01 NOTE — Progress Notes (Signed)
Nathan Dean 62 y.o.   Chief Complaint  Patient presents with  . Hand Pain    both, pt states his hands are warm and they hurt/ x 2 wks    HISTORY OF PRESENT ILLNESS: This is a 62 y.o. male diabetic complaining of burning pain to both hands for the past 2 weeks.  No injuries.  No other significant symptoms.  HPI   Prior to Admission medications   Medication Sig Start Date End Date Taking? Authorizing Provider  amLODipine (NORVASC) 10 MG tablet Take 0.5 tablets (5 mg total) by mouth daily. 11/15/17  Yes Mahkayla Preece, Ines Bloomer, MD  aspirin 81 MG tablet Take 1 tablet (81 mg total) by mouth daily. 08/11/16  Yes Karamalegos, Devonne Doughty, DO  Blood Glucose Monitoring Suppl (ONE TOUCH ULTRA SYSTEM KIT) w/Device KIT 1 kit by Does not apply route once. Dx. E11.9 07/28/15  Yes Arlis Porta., MD  carvedilol (COREG) 6.25 MG tablet Take 1 tablet (6.25 mg total) by mouth 2 (two) times daily with a meal. 01/05/17  Yes Wendie Agreste, MD  Cholecalciferol (VITAMIN D3) 5000 UNITS CAPS Take 10,000 Units by mouth daily.    Yes [provider]  Dulaglutide (TRULICITY) 1.5 DX/4.1OI SOPN Inject 1.5 mg into the skin once a week. 01/05/17  Yes Wendie Agreste, MD  gabapentin (NEURONTIN) 300 MG capsule TAKE 1-2 CAPSULES BY MOUTH THREE TIMES A DAY 01/30/18  Yes Myrakle Wingler, Ines Bloomer, MD  glucose blood test strip 1 each by Other route 4 (four) times daily. Use with One touch meter to check blood sugar. Dx E11.9 09/13/16  Yes Mikey College, NP  LEVEMIR FLEXTOUCH 100 UNIT/ML Pen inject 54 units subcutaneously twice a day 01/05/17  Yes Wendie Agreste, MD  losartan (COZAAR) 100 MG tablet Take 1 tablet (100 mg total) by mouth daily. 01/05/17  Yes Wendie Agreste, MD  omeprazole (PRILOSEC) 40 MG capsule Take 40 mg by mouth daily.   Yes [provider]  ONE TOUCH LANCETS MISC 1 each by Does not apply route 4 (four) times daily. Use with one touch meter to check blood sugar. Dx: E11.9  09/16/16  Yes Mikey College, NP  escitalopram (LEXAPRO) 10 MG tablet TAKE 1 TABLET(10 MG) BY MOUTH DAILY Patient not taking: Reported on 03/01/2018 07/05/17   Wendie Agreste, MD  Insulin Pen Needle (BD PEN NEEDLE NANO U/F) 32G X 4 MM MISC Inject 1 each as directed 3 (three) times daily. 01/07/17   Wendie Agreste, MD  metFORMIN (GLUCOPHAGE) 500 MG tablet Take 2 tablets (1,000 mg total) by mouth 2 (two) times daily. 09/06/17 12/05/17  Horald Pollen, MD  methylPREDNISolone (MEDROL DOSEPAK) 4 MG TBPK tablet Sig as indicated Patient not taking: Reported on 09/06/2017 07/12/17   Horald Pollen, MD  oxyCODONE (ROXICODONE) 5 MG immediate release tablet Take 1 tablet (5 mg total) by mouth every 6 (six) hours as needed for breakthrough pain. Patient not taking: Reported on 09/06/2017 12/28/16   Milly Jakob, MD  pravastatin (PRAVACHOL) 20 MG tablet TAKE 1 TABLET BY MOUTH ONCE DAILY Patient not taking: Reported on 03/01/2018 03/25/17   Wendie Agreste, MD  vitamin B-12 (CYANOCOBALAMIN) 1000 MCG tablet Take 1,000 mcg by mouth daily.    [provider]    Allergies  Allergen Reactions  . Penicillins Anaphylaxis, Rash and Other (See Comments)  . Succinylcholine Chloride Anaphylaxis and Rash  . Keflex [Cephalexin] Rash  . Sulfa Antibiotics Rash and Other (See  Comments)    Patient Active Problem List   Diagnosis Date Noted  . Chest congestion 07/20/2017  . Viral illness 07/20/2017  . Lower respiratory infection 07/12/2017  . Laryngitis, acute 07/12/2017  . Cough 07/12/2017  . Pleurisy 07/12/2017  . Depression 06/03/2015  . HTN (hypertension) 01/02/2015  . Hyperlipidemia 01/02/2015  . 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 Sleep apnea 09/21/2010  . Arthritis, degenerative 02/17/2009  . Hereditary and idiopathic peripheral neuropathy 11/21/2008  . AD (atopic dermatitis) 07/08/2008  . Acid reflux 04/05/2007    Past  Medical History:  Diagnosis Date  . Diabetes ([place])   . GERD (gastroesophageal reflux disease)   . Hyperlipidemia   . Hypertension   . Sleep apnea     Past Surgical History:  Procedure Laterality Date  . CARPAL TUNNEL RELEASE Right 12/28/2016   Procedure: RIGHT ULNAR AND MEDIAN NEUROPLASTY AT WRIST;  Surgeon: Milly Jakob, MD;  Location: [place];  Service: Orthopedics;  Laterality: Right;  . REPLACEMENT TOTAL KNEE BILATERAL  2014   left 2012 rt 2014    Social History   Socioeconomic History  . Marital status: Married    Spouse name: Not on file  . Number of children: 0  . Years of education: Not on file  . Highest education level: Not on file  Occupational History  . Not on file  Social Needs  . Financial resource strain: Not on file  . Food insecurity:    Worry: Not on file    Inability: Not on file  . Transportation needs:    Medical: Not on file    Non-medical: Not on file  Tobacco Use  . Smoking status: Former Smoker    Packs/day: 2.00    Years: 20.00    Pack years: 40.00    Types: Cigarettes    Last attempt to quit: 06/28/1996    Years since quitting: 21.6  . Smokeless tobacco: Never Used  Substance and Sexual Activity  . Alcohol use: Yes    Alcohol/week: 0.0 standard drinks    Comment: occasional  . Drug use: No  . Sexual activity: Not on file  Lifestyle  . Physical activity:    Days per week: Not on file    Minutes per session: Not on file  . Stress: Not on file  Relationships  . Social connections:    Talks on phone: Not on file    Gets together: Not on file    Attends religious service: Not on file    Active member of club or organization: Not on file    Attends meetings of clubs or organizations: Not on file    Relationship status: Not on file  . Intimate partner violence:    Fear of current or ex partner: Not on file    Emotionally abused: Not on file    Physically abused: Not on file    Forced sexual activity: Not on  file  Other Topics Concern  . Not on file  Social History Narrative  . Not on file    Family History  Problem Relation Age of Onset  . Cancer Mother   . Kidney failure Father   . Cancer Brother      Review of Systems  Constitutional: Negative.  Negative for chills and fever.  Respiratory: Negative.  Negative for cough and shortness of breath.   Cardiovascular: Negative.  Negative for chest pain and palpitations.  Gastrointestinal: Negative.  Negative for abdominal pain, nausea and vomiting.  Skin: Negative.  Negative for rash.  Endo/Heme/Allergies: Negative.   All other systems reviewed and are negative.  Vitals:   03/01/18 1516 03/01/18 1518  BP: (!) 171/108 (!) 158/88  Pulse: 92   Resp: 16   Temp: 98 F (36.7 C)   SpO2: 96%      Physical Exam  Constitutional: He is oriented to person, place, and time. He appears well-developed and well-nourished.  HENT:  Head: Normocephalic and atraumatic.  Eyes: Pupils are equal, round, and reactive to light. EOM are normal.  Neck: Normal range of motion. Neck supple.  Cardiovascular: Normal rate and regular rhythm.  Pulmonary/Chest: Effort normal.  Musculoskeletal:  Hands: No erythema or bruising.  Full range of motion with good grip.  No visible swelling or tenderness to palpation.  NVI.  Neurological: He is alert and oriented to person, place, and time.  Skin: Skin is warm and dry. Capillary refill takes less than 2 seconds.  Psychiatric: He has a normal mood and affect. His behavior is normal.  Vitals reviewed.  A total of 25 minutes was spent in the room with the patient, greater than 50% of which was in counseling/coordination of care regarding differential diagnosis, treatment, medications, and need for follow-up.  Patient has follow-up appointment with his orthopedist tomorrow..   ASSESSMENT & PLAN: Jaymin was seen today for hand pain.  Diagnoses and all orders for this visit:  Pain in both hands  Neuropathic  pain  Need for influenza vaccination -     Flu Vaccine QUAD 36+ mos IM    Patient Instructions       If you have lab work done today you will be contacted with your lab results within the next 2 weeks.  If you have not heard from [name] then please contact us. The fastest way to get your results is to register for [name].   IF you received an x-ray today, you will receive an invoice from [facility]. Please contact [facility] at [phone] with questions or concerns regarding your invoice.   IF you received labwork today, you will receive an invoice from [facility]. Please contact [name] at [phone] with questions or concerns regarding your invoice.   Our billing staff will not be able to assist you with questions regarding bills from these companies.  You will be contacted with the lab results as soon as they are available. The fastest way to get your results is to activate your [name] account. Instructions are located on the last page of this paperwork. If you have not heard from [name] regarding the results in 2 weeks, please contact this office.      Neuropathic Pain Neuropathic pain is pain caused by damage to the nerves that are responsible for certain sensations in your body (sensory nerves). The pain can be caused by damage to:  The sensory nerves that send signals to your spinal cord and brain (peripheral nervous system).  The sensory nerves in your brain or spinal cord (central nervous system).  Neuropathic pain can make you more sensitive to pain. What would be a minor sensation for most people may feel very painful if you have neuropathic pain. This is usually a long-term condition that can be difficult to treat. The type of pain can differ from person to person. It may start suddenly (acute), or it may develop slowly and last for a long time (chronic). Neuropathic pain may come and go as damaged  nerves heal or may stay at the same level for years.  It often causes emotional distress, loss of sleep, and a lower quality of life. What are the causes? The most common cause of damage to a sensory nerve is diabetes. Many other diseases and conditions can also cause neuropathic pain. Causes of neuropathic pain can be classified as:  Toxic. Many drugs and chemicals can cause toxic damage. The most common cause of toxic neuropathic pain is damage from drug treatment for cancer (chemotherapy).  Metabolic. This type of pain can happen when a disease causes imbalances that damage nerves. Diabetes is the most common of these diseases. Vitamin B deficiency caused by long-term alcohol abuse is another common cause.  Traumatic. Any injury that cuts, crushes, or stretches a nerve can cause damage and pain. A common example is feeling pain after losing an arm or leg (phantom limb pain).  Compression-related. If a sensory nerve gets trapped or compressed for a long period of time, the blood supply to the nerve can be cut off.  Vascular. Many blood vessel diseases can cause neuropathic pain by decreasing blood supply and oxygen to nerves.  Autoimmune. This type of pain results from diseases in which the body's defense system mistakenly attacks sensory nerves. Examples of autoimmune diseases that can cause neuropathic pain include lupus and multiple sclerosis.  Infectious. Many types of viral infections can damage sensory nerves and cause pain. Shingles infection is a common cause of this type of pain.  Inherited. Neuropathic pain can be a symptom of many diseases that are passed down through families (genetic).  What are the signs or symptoms? The main symptom is pain. Neuropathic pain is often described as:  Burning.  Shock-like.  Stinging.  Hot or cold.  Itching.  How is this diagnosed? No single test can diagnose neuropathic pain. Your health care provider will do a physical exam and ask you about your pain. You may use a pain scale to  describe how bad your pain is. You may also have tests to see if you have a high sensitivity to pain and to help find the cause and location of any sensory nerve damage. These tests may include:  Imaging studies, such as: ? X-rays. ? CT scan. ? MRI.  Nerve conduction studies to test how well nerve signals travel through your sensory nerves (electrodiagnostic testing).  Stimulating your sensory nerves through electrodes on your skin and measuring the response in your spinal cord and brain (somatosensory evoked potentials).  How is this treated? Treatment for neuropathic pain may change over time. You may need to try different treatment options or a combination of treatments. Some options include:  Over-the-counter pain relievers.  Prescription medicines. Some medicines used to treat other conditions may also help neuropathic pain. These include medicines to: ? Control seizures (anticonvulsants). ? Relieve depression (antidepressants).  Prescription-strength pain relievers (narcotics). These are usually used when other pain relievers do not help.  Transcutaneous nerve stimulation (TENS). This uses electrical currents to block painful nerve signals. The treatment is painless.  Topical and local anesthetics. These are medicines that numb the nerves. They can be injected as a nerve block or applied to the skin.  Alternative treatments, such as: ? Acupuncture. ? Meditation. ? Massage. ? Physical therapy. ? Pain management programs. ? Counseling.  Follow these instructions at home:  Learn as much as you can about your condition.  Take medicines only as directed by your health care provider.  Work closely with all your  health care providers to find what works best for you.  Have a good support system at home.  Consider joining a chronic pain support group. Contact a health care provider if:  Your pain treatments are not helping.  You are having side effects from your  medicines.  You are struggling with fatigue, mood changes, depression, or anxiety. This information is not intended to replace advice given to you by your health care provider. Make sure you discuss any questions you have with your health care provider. Document Released: 01/29/2004 Document Revised: 11/21/2015 Document Reviewed: 10/11/2013 Elsevier Interactive Patient Education  2018 Elsevier Inc.      Agustina Caroli, MD [facility]

## 2018-03-07 ENCOUNTER — Telehealth: Payer: Self-pay | Admitting: Emergency Medicine

## 2018-03-07 ENCOUNTER — Ambulatory Visit: Payer: Managed Care, Other (non HMO)

## 2018-03-07 DIAGNOSIS — M79641 Pain in right hand: Secondary | ICD-10-CM

## 2018-03-07 DIAGNOSIS — M79642 Pain in left hand: Principal | ICD-10-CM

## 2018-03-07 NOTE — Telephone Encounter (Signed)
PEC message to Dr. Alvy Bimler Re: pt req increase in Gabapentin

## 2018-03-07 NOTE — Telephone Encounter (Signed)
Copied from CRM [id number]. Topic: General - Other >> Mar 07, 2018  2:17 PM Jaquita Rector A wrote: Reason for CRM: Patient called to ask can he up his gabapentin (NEURONTIN) 300 MG capsule  to 600mg  at night, due to pain in hands. Please advise Ph# [phone]  [id number] - [place], [place] - [address] [phone] (Phone) [phone] (Fax)

## 2018-03-09 NOTE — Telephone Encounter (Signed)
Yes he can

## 2018-03-09 NOTE — Telephone Encounter (Signed)
Spoke to pt and gave him Dr. Latrelle Dodrill message Re: increase gabapentin from 300 to 600mg  at night due to pain.  Pt asked if there is any other meds he can take for the pain? Message sent to Dr. Alvy Bimler

## 2018-03-09 NOTE — Telephone Encounter (Signed)
Tylenol.

## 2018-03-09 NOTE — Telephone Encounter (Signed)
Spoke to pt - gave Dr. Latrelle Dodrill message Advised he could take 2 tablets of Extra Strength tylenol (1,000mg  total ) every 8 hours.  Pt verbalized understanding.

## 2018-03-10 NOTE — Telephone Encounter (Signed)
Pt message re: something for pain - sent to Dr. Alvy Bimler. Note: spoke to pt yesterday with Dr. Latrelle Dodrill suggestion he try Tylenol

## 2018-03-10 NOTE — Telephone Encounter (Signed)
Patient is calling and states that the Nathan Dean is not working. He would like something stronger sent in.   [phone]

## 2018-03-13 ENCOUNTER — Other Ambulatory Visit: Payer: Self-pay | Admitting: Family Medicine

## 2018-03-13 DIAGNOSIS — I10 Essential (primary) hypertension: Secondary | ICD-10-CM

## 2018-03-14 ENCOUNTER — Ambulatory Visit: Payer: Managed Care, Other (non HMO)

## 2018-03-14 MED ORDER — TRAMADOL HCL 50 MG PO TABS: 50.0000 mg | ORAL_TABLET | Freq: Three times a day (TID) | ORAL | 0 refills | Status: DC | PRN

## 2018-03-14 NOTE — Telephone Encounter (Signed)
Patient called to say that he tried Tylenol and Ibuprofen and none of these work asking for something stronger sent to the Pharmacy. Request a call back Ph# [phone]

## 2018-03-14 NOTE — Telephone Encounter (Signed)
Please advise 

## 2018-03-14 NOTE — Telephone Encounter (Signed)
Tramadol prescription sent to his pharmacy. Thanks.

## 2018-03-14 NOTE — Addendum Note (Signed)
Addended by: Evie Lacks on: 03/14/2018 04:03 PM   Modules accepted: Orders

## 2018-03-14 NOTE — Telephone Encounter (Signed)
Pt advised of x sent to pharmacy.

## 2018-03-16 ENCOUNTER — Ambulatory Visit: Payer: Managed Care, Other (non HMO)

## 2018-03-20 ENCOUNTER — Ambulatory Visit: Payer: Managed Care, Other (non HMO) | Admitting: Family Medicine

## 2018-03-20 ENCOUNTER — Encounter: Payer: Self-pay | Admitting: Family Medicine

## 2018-03-20 ENCOUNTER — Other Ambulatory Visit: Payer: Self-pay

## 2018-03-20 VITALS — BP 155/104 | HR 100 | Temp 98.1°F | Resp 16 | Ht >= 80 in | Wt 375.6 lb

## 2018-03-20 DIAGNOSIS — E1142 Type 2 diabetes mellitus with diabetic polyneuropathy: Secondary | ICD-10-CM

## 2018-03-20 DIAGNOSIS — L03011 Cellulitis of right finger: Secondary | ICD-10-CM

## 2018-03-20 LAB — POCT CBC
Granulocyte percent: 6.6 %G — AB (ref 37–80)
HCT, POC: 44.4 % — AB (ref 29–41)
HEMOGLOBIN: 14.8 g/dL — AB (ref 9.5–13.5)
LYMPH, POC: 22.7 — AB (ref 0.6–3.4)
MCH: 27.7 pg (ref 27–31.2)
MCHC: 33.3 g/dL (ref 31.8–35.4)
MCV: 83.1 fL (ref 76–111)
MID (cbc): 75.3 — AB (ref 0–0.9)
MPV: 6.7 fL (ref 0–99.8)
PLATELET COUNT, POC: 231 10*3/uL (ref 142–424)
POC Granulocyte: 0.2 — AB (ref 2–6.9)
POC LYMPH PERCENT: 2 %L — AB (ref 10–50)
POC MID %: 2 %M (ref 0–12)
RBC: 5.34 M/uL (ref 4.69–6.13)
RDW, POC: 15.5 %
WBC: 8.8 10*3/uL (ref 4.6–10.2)

## 2018-03-20 LAB — GLUCOSE, POCT (MANUAL RESULT ENTRY): POC Glucose: 113 mg/dl — AB (ref 70–99)

## 2018-03-20 MED ORDER — DOXYCYCLINE HYCLATE 100 MG PO TABS: 100.0000 mg | ORAL_TABLET | Freq: Two times a day (BID) | ORAL | 0 refills | Status: DC

## 2018-03-20 NOTE — Progress Notes (Addendum)
Chief Complaint  Patient presents with  . nicked nailbed index finger right hand x Saturday 03/18/18    pain level 4/10 throbbing and consistent.  Per pt relieved pressure with needle from lancet to relieve the pressure.  Nicked it around nail bed    HPI  Patient reports that he "nicked" his finger 2 days ago It become swallon with erythema so he took his 71 G lancet and poked it 15 times to alleviate the pressure He states that he was able to express exudate States that he knows that he is diabetic so when he saw the yellow drainage he came in for evaluation He has neuropathy and there is no pain States that he can move the tip of the finger but it feels tight due to the inflammation He has not fevers but is chronically on prednisone      Past Medical History:  Diagnosis Date  . Diabetes ([place])   . GERD (gastroesophageal reflux disease)   . Hyperlipidemia   . Hypertension   . Sleep apnea     Current Outpatient Medications  Medication Sig Dispense Refill  . amLODipine (NORVASC) 10 MG tablet Take 0.5 tablets (5 mg total) by mouth daily. 45 tablet 3  . aspirin 81 MG tablet Take 1 tablet (81 mg total) by mouth daily. 30 tablet   . Blood Glucose Monitoring Suppl (ONE TOUCH ULTRA SYSTEM KIT) w/Device KIT 1 kit by Does not apply route once. Dx. E11.9 1 each 0  . carvedilol (COREG) 6.25 MG tablet TAKE 1 TABLET BY MOUTH TWICE A DAY WITH FOOD 180 tablet 1  . Cholecalciferol (VITAMIN D3) 5000 UNITS CAPS Take 10,000 Units by mouth daily.     . Dulaglutide (TRULICITY) 1.5 LG/9.2JJ SOPN Inject 1.5 mg into the skin once a week. 6 mL 1  . gabapentin (NEURONTIN) 300 MG capsule TAKE 1-2 CAPSULES BY MOUTH THREE TIMES A DAY 180 capsule 0  . glucose blood test strip 1 each by Other route 4 (four) times daily. Use with One touch meter to check blood sugar. Dx E11.9 100 each 12  . insulin degludec (TRESIBA) 100 UNIT/ML SOPN FlexTouch Pen Inject 80 Units into the skin daily.    Marland Kitchen losartan (COZAAR)  100 MG tablet TAKE 1 TABLET BY MOUTH EVERY DAY 90 tablet 1  . methylPREDNISolone (MEDROL DOSEPAK) 4 MG TBPK tablet Sig as indicated 21 tablet 1  . omeprazole (PRILOSEC) 40 MG capsule Take 40 mg by mouth daily.    . ONE TOUCH LANCETS MISC 1 each by Does not apply route 4 (four) times daily. Use with one touch meter to check blood sugar. Dx: E11.9 200 each 12  . Semaglutide (OZEMPIC, 1 MG/DOSE, [place]) Inject into the skin.    Marland Kitchen traMADol (ULTRAM) 50 MG tablet Take 1 tablet (50 mg total) by mouth every 8 (eight) hours as needed. 15 tablet 0  . vitamin B-12 (CYANOCOBALAMIN) 1000 MCG tablet Take 1,000 mcg by mouth daily.    Marland Kitchen doxycycline (VIBRA-TABS) 100 MG tablet Take 1 tablet (100 mg total) by mouth 2 (two) times daily. 20 tablet 0  . escitalopram (LEXAPRO) 10 MG tablet TAKE 1 TABLET(10 MG) BY MOUTH DAILY (Patient not taking: Reported on 03/01/2018) 30 tablet 0  . Insulin Pen Needle (BD PEN NEEDLE NANO U/F) 32G X 4 MM MISC Inject 1 each as directed 3 (three) times daily. (Patient not taking: Reported on 03/20/2018) 300 each 3  . LEVEMIR FLEXTOUCH 100 UNIT/ML Pen inject 54 units subcutaneously twice  a day (Patient not taking: Reported on 03/20/2018) 12 pen 12  . metFORMIN (GLUCOPHAGE) 500 MG tablet Take 2 tablets (1,000 mg total) by mouth 2 (two) times daily. 360 tablet 3  . oxyCODONE (ROXICODONE) 5 MG immediate release tablet Take 1 tablet (5 mg total) by mouth every 6 (six) hours as needed for breakthrough pain. (Patient not taking: Reported on 09/06/2017) 20 tablet 0  . pravastatin (PRAVACHOL) 20 MG tablet TAKE 1 TABLET BY MOUTH ONCE DAILY (Patient not taking: Reported on 03/01/2018) 90 tablet 0   No current facility-administered medications for this visit.     Allergies:  Allergies  Allergen Reactions  . Penicillins Anaphylaxis, Rash and Other (See Comments)  . Succinylcholine Chloride Anaphylaxis and Rash  . Keflex [Cephalexin] Rash  . Sulfa Antibiotics Rash and Other (See Comments)    Past  Surgical History:  Procedure Laterality Date  . CARPAL TUNNEL RELEASE Right 12/28/2016   Procedure: RIGHT ULNAR AND MEDIAN NEUROPLASTY AT WRIST;  Surgeon: Milly Jakob, MD;  Location: [place];  Service: Orthopedics;  Laterality: Right;  . REPLACEMENT TOTAL KNEE BILATERAL  2014   left 2012 rt 2014    Social History   Socioeconomic History  . Marital status: Married    Spouse name: Not on file  . Number of children: 0  . Years of education: Not on file  . Highest education level: Not on file  Occupational History  . Not on file  Social Needs  . Financial resource strain: Not on file  . Food insecurity:    Worry: Not on file    Inability: Not on file  . Transportation needs:    Medical: Not on file    Non-medical: Not on file  Tobacco Use  . Smoking status: Former Smoker    Packs/day: 2.00    Years: 20.00    Pack years: 40.00    Types: Cigarettes    Last attempt to quit: 06/28/1996    Years since quitting: 21.7  . Smokeless tobacco: Never Used  Substance and Sexual Activity  . Alcohol use: Yes    Alcohol/week: 0.0 standard drinks    Comment: occasional  . Drug use: No  . Sexual activity: Not on file  Lifestyle  . Physical activity:    Days per week: Not on file    Minutes per session: Not on file  . Stress: Not on file  Relationships  . Social connections:    Talks on phone: Not on file    Gets together: Not on file    Attends religious service: Not on file    Active member of club or organization: Not on file    Attends meetings of clubs or organizations: Not on file    Relationship status: Not on file  Other Topics Concern  . Not on file  Social History Narrative  . Not on file    Family History  Problem Relation Age of Onset  . Cancer Mother   . Kidney failure Father   . Cancer Brother      ROS Review of Systems See HPI Constitution: No fevers or chills No malaise No diaphoresis Skin: No rash or itching Eyes: no blurry  vision, no double vision GU: no dysuria or hematuria Neuro: no dizziness or headaches all others reviewed and negative   Objective: Vitals:   03/20/18 1510  BP: (!) 155/104  Pulse: 100  Resp: 16  Temp: 98.1 F (36.7 C)  TempSrc: Oral  SpO2: 93%  Weight: (!) 375 lb 9.6 oz (170.4 kg)  Height: 6' 9" (2.057 m)    Physical Exam Gen: alert and oriented x 3, in NAD CV: NSR, no murmur Pulm: no wheezing, CTAB  Extremities:  Exam of the Right hand Good blood flow DIP without effusion or edema Ecchymosis noted around the cuticle edge Erythema and purulent drainage Patient already created severe open areas that are expressing purulent drainage, no discrete pocket    Assessment and Plan Severus was seen today for nicked nailbed index finger right hand x saturday 03/18/18.  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      The patient has neuropathy and does not have any sensation Does not withdraw in pain CBC shows no leukocytosis and no elevation in granulocytes Due to his allergies to PCN, sulfa and keflex will do doxycycline which he previously tolerated He has diabetes and is on chronic steroids His glucose today was 114  Discussed that he should follow up for reassessment in 3 days  I will double book my schedule to facilitate consistency in care for this issue   Nathan Dean

## 2018-03-20 NOTE — Patient Instructions (Addendum)
Return to clinic in 3 days for re-evaluation    If you have lab work done today you will be contacted with your lab results within the next 2 weeks.  If you have not heard from [name] then please contact us. The fastest way to get your results is to register for [name].   IF you received an x-ray today, you will receive an invoice from [facility]. Please contact [facility] at [phone] with questions or concerns regarding your invoice.   IF you received labwork today, you will receive an invoice from [place]. Please contact [name] at [phone] with questions or concerns regarding your invoice.   Our billing staff will not be able to assist you with questions regarding bills from these companies.  You will be contacted with the lab results as soon as they are available. The fastest way to get your results is to activate your [name] account. Instructions are located on the last page of this paperwork. If you have not heard from [name] regarding the results in 2 weeks, please contact this office.    Paronychia Paronychia is an infection of the skin that surrounds a nail. It usually affects the skin around a fingernail, but it may also occur near a toenail. It often causes pain and swelling around the nail. This condition may come on suddenly or develop over a longer period. In some cases, a collection of pus (abscess) can form near or under the nail. Usually, paronychia is not serious and it clears up with treatment. What are the causes? This condition may be caused by bacteria or fungi. It is commonly caused by either Streptococcus or Staphylococcus bacteria. The bacteria or fungi often cause the infection by getting into the affected area through an opening in the skin, such as a cut or a hangnail. What increases the risk? This condition is more likely to develop in:  People who get their hands wet often, such as those who work as Fish farm manager, bartenders, or  nurses.  People who bite their fingernails or suck their thumbs.  People who trim their nails too short.  People who have hangnails or injured fingertips.  People who get manicures.  People who have diabetes.  What are the signs or symptoms? Symptoms of this condition include:  Redness and swelling of the skin near the nail.  Tenderness around the nail when you touch the area.  Pus-filled bumps under the cuticle. The cuticle is the skin at the base or sides of the nail.  Fluid or pus under the nail.  Throbbing pain in the area.  How is this diagnosed? This condition is usually diagnosed with a physical exam. In some cases, a sample of pus may be taken from an abscess to be tested in a lab. This can help to determine what type of bacteria or fungi is causing the condition. How is this treated? Treatment for this condition depends on the cause and severity of the condition. If the condition is mild, it may clear up on its own in a few days. Your health care provider may recommend soaking the affected area in warm water a few times a day. When treatment is needed, the options may include:  Antibiotic medicine, if the condition is caused by a bacterial infection.  Antifungal medicine, if the condition is caused by a fungal infection.  Incision and drainage, if an abscess is present. In this procedure, the health care provider will cut open the abscess so the pus  can drain out.  Follow these instructions at home:  Soak the affected area in warm water if directed to do so by your health care provider. You may be told to do this for 20 minutes, 2-3 times a day. Keep the area dry in between soakings.  Take medicines only as directed by your health care provider.  If you were prescribed an antibiotic medicine, finish all of it even if you start to feel better.  Keep the affected area clean.  Do not try to drain a fluid-filled bump yourself.  If you will be washing dishes or  performing other tasks that require your hands to get wet, wear rubber gloves. You should also wear gloves if your hands might come in contact with irritating substances, such as cleaners or chemicals.  Follow your health care provider's instructions about: ? Wound care. ? Bandage (dressing) changes and removal. Contact a health care provider if:  Your symptoms get worse or do not improve with treatment.  You have a fever or chills.  You have redness spreading from the affected area.  You have continued or increased fluid, blood, or pus coming from the affected area.  Your finger or knuckle becomes swollen or is difficult to move. This information is not intended to replace advice given to you by your health care provider. Make sure you discuss any questions you have with your health care provider. Document Released: 10/27/2000 Document Revised: 10/09/2015 Document Reviewed: 04/10/2014 Elsevier Interactive Patient Education  [employer].

## 2018-03-21 ENCOUNTER — Ambulatory Visit: Payer: Managed Care, Other (non HMO)

## 2018-03-23 ENCOUNTER — Ambulatory Visit: Payer: Managed Care, Other (non HMO)

## 2018-03-23 NOTE — Progress Notes (Signed)
Chief Complaint  Patient presents with  . Hand Pain    3 day follow up on finger infection; states its getting better    HPI  Paronychia The patient is here to follow up after getting antibiotics for paronychia He reports that he has not drainage or pulsation States that he feels well without fevers He can bend the finger without pain  He is a type diabetic Lab Results  Component Value Date   HGBA1C 11.0 07/20/2017   He states that he does not regularly check her sugars He is compliant with all meds  Hypertension: Patient here for follow-up of elevated blood pressure. He is not exercising and is adherent to low salt diet.  Blood pressure is not well controlled at home and he typically runs about 528U systolic. Cardiac symptoms none. Patient denies chest pain, chest pressure/discomfort, claudication, dyspnea and exertional chest pressure/discomfort.  Cardiovascular risk factors: advanced age (older than 24 for men, 69 for women) and hypertension.   BP Readings from Last 3 Encounters:  03/24/18 (!) 159/81  03/20/18 (!) 155/104  03/01/18 (!) 158/88     Depression screen PHQ 2/9 03/20/2018 03/01/2018 03/01/2018 09/06/2017 07/20/2017  Decreased Interest 0 0 0 0 0  Down, Depressed, Hopeless 0 0 0 0 0  PHQ - 2 Score 0 0 0 0 0  Altered sleeping - - - - -  Tired, decreased energy - - - - -  Change in appetite - - - - -  Feeling bad or failure about yourself  - - - - -  Trouble concentrating - - - - -  Moving slowly or fidgety/restless - - - - -  Suicidal thoughts - - - - -  PHQ-9 Score - - - - -  Difficult doing work/chores - - - - -     Past Medical History:  Diagnosis Date  . Diabetes ([place])   . GERD (gastroesophageal reflux disease)   . Hyperlipidemia   . Hypertension   . Sleep apnea     Current Outpatient Medications  Medication Sig Dispense Refill  . amLODipine (NORVASC) 10 MG tablet Take 0.5 tablets (5 mg total) by mouth daily. 45 tablet 3  . aspirin 81 MG tablet  Take 1 tablet (81 mg total) by mouth daily. 30 tablet   . Blood Glucose Monitoring Suppl (ONE TOUCH ULTRA SYSTEM KIT) w/Device KIT 1 kit by Does not apply route once. Dx. E11.9 1 each 0  . carvedilol (COREG) 6.25 MG tablet TAKE 1 TABLET BY MOUTH TWICE A DAY WITH FOOD 180 tablet 1  . Cholecalciferol (VITAMIN D3) 5000 UNITS CAPS Take 10,000 Units by mouth daily.     Marland Kitchen doxycycline (VIBRA-TABS) 100 MG tablet Take 1 tablet (100 mg total) by mouth 2 (two) times daily. 20 tablet 0  . Dulaglutide (TRULICITY) 1.5 XL/2.4MW SOPN Inject 1.5 mg into the skin once a week. 6 mL 1  . escitalopram (LEXAPRO) 10 MG tablet TAKE 1 TABLET(10 MG) BY MOUTH DAILY 30 tablet 0  . glucose blood test strip 1 each by Other route 4 (four) times daily. Use with One touch meter to check blood sugar. Dx E11.9 100 each 12  . insulin degludec (TRESIBA) 100 UNIT/ML SOPN FlexTouch Pen Inject 80 Units into the skin daily.    . Insulin Pen Needle (BD PEN NEEDLE NANO U/F) 32G X 4 MM MISC Inject 1 each as directed 3 (three) times daily. 300 each 3  . losartan (COZAAR) 100 MG tablet TAKE 1 TABLET  BY MOUTH EVERY DAY 90 tablet 1  . omeprazole (PRILOSEC) 40 MG capsule Take 40 mg by mouth daily.    . ONE TOUCH LANCETS MISC 1 each by Does not apply route 4 (four) times daily. Use with one touch meter to check blood sugar. Dx: E11.9 200 each 12  . pravastatin (PRAVACHOL) 20 MG tablet TAKE 1 TABLET BY MOUTH ONCE DAILY 90 tablet 0  . Semaglutide (OZEMPIC, 1 MG/DOSE, [place]) Inject into the skin.    Marland Kitchen traMADol (ULTRAM) 50 MG tablet Take 1 tablet (50 mg total) by mouth every 8 (eight) hours as needed. 15 tablet 0  . gabapentin (NEURONTIN) 300 MG capsule TAKE 1-2 CAPSULES BY MOUTH THREE TIMES A DAY 540 capsule 1  . hydrochlorothiazide (HYDRODIURIL) 12.5 MG tablet Take 1 tablet (12.5 mg total) by mouth daily. 90 tablet 0   No current facility-administered medications for this visit.     Allergies:  Allergies  Allergen Reactions  . Penicillins  Anaphylaxis, Rash and Other (See Comments)  . Succinylcholine Chloride Anaphylaxis and Rash  . Keflex [Cephalexin] Rash  . Sulfa Antibiotics Rash and Other (See Comments)    Past Surgical History:  Procedure Laterality Date  . CARPAL TUNNEL RELEASE Right 12/28/2016   Procedure: RIGHT ULNAR AND MEDIAN NEUROPLASTY AT WRIST;  Surgeon: Milly Jakob, MD;  Location: [facility];  Service: Orthopedics;  Laterality: Right;  . REPLACEMENT TOTAL KNEE BILATERAL  2014   left 2012 rt 2014    Social History   Socioeconomic History  . Marital status: Married    Spouse name: Not on file  . Number of children: 0  . Years of education: Not on file  . Highest education level: Not on file  Occupational History  . Not on file  Social Needs  . Financial resource strain: Not on file  . Food insecurity:    Worry: Not on file    Inability: Not on file  . Transportation needs:    Medical: Not on file    Non-medical: Not on file  Tobacco Use  . Smoking status: Former Smoker    Packs/day: 2.00    Years: 20.00    Pack years: 40.00    Types: Cigarettes    Last attempt to quit: 06/28/1996    Years since quitting: 21.7  . Smokeless tobacco: Never Used  Substance and Sexual Activity  . Alcohol use: Yes    Alcohol/week: 0.0 standard drinks    Comment: occasional  . Drug use: No  . Sexual activity: Not on file  Lifestyle  . Physical activity:    Days per week: Not on file    Minutes per session: Not on file  . Stress: Not on file  Relationships  . Social connections:    Talks on phone: Not on file    Gets together: Not on file    Attends religious service: Not on file    Active member of club or organization: Not on file    Attends meetings of clubs or organizations: Not on file    Relationship status: Not on file  Other Topics Concern  . Not on file  Social History Narrative  . Not on file    Family History  Problem Relation Age of Onset  . Cancer Mother   . Kidney  failure Father   . Cancer Brother      ROS Review of Systems See HPI Constitution: No fevers or chills No malaise No diaphoresis Skin: No rash or  itching Eyes: no blurry vision, no double vision GU: no dysuria or hematuria Neuro: no dizziness or headaches * all others reviewed and negative   Objective: Vitals:   03/24/18 0819  BP: (!) 159/81  Pulse: 90  Resp: 18  Temp: 98.1 F (36.7 C)  TempSrc: Oral  SpO2: 98%  Weight: (!) 375 lb 3.2 oz (170.2 kg)  Height: 6' 9" (2.057 m)    Physical Exam Physical Exam  Constitutional: He is oriented to person, place, and time. He appears well-developed and well-nourished.  HENT:  Head: Normocephalic and atraumatic.  Eyes: Conjunctivae and EOM are normal.  Cardiovascular: Normal rate, regular rhythm, normal heart sounds and intact distal pulses.  No murmur heard. Pulmonary/Chest: Effort normal and breath sounds normal. No stridor. No respiratory distress. He has no wheezes.  Neurological: He is alert and oriented to person, place, and time.  Skin: right index finger without purulent drainage, trace cuticle erythema Skin is warm. Capillary refill takes less than 2 seconds.  Psychiatric: He has a normal mood and affect. His behavior is normal. Judgment and thought content normal.    Assessment and Plan Shivam was seen today for hand pain.  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     [place]

## 2018-03-24 ENCOUNTER — Ambulatory Visit (INDEPENDENT_AMBULATORY_CARE_PROVIDER_SITE_OTHER): Payer: Managed Care, Other (non HMO) | Admitting: Family Medicine

## 2018-03-24 ENCOUNTER — Other Ambulatory Visit: Payer: Self-pay | Admitting: Emergency Medicine

## 2018-03-24 ENCOUNTER — Encounter: Payer: Self-pay | Admitting: Family Medicine

## 2018-03-24 VITALS — BP 159/81 | HR 90 | Temp 98.1°F | Resp 18 | Ht >= 80 in | Wt 375.2 lb

## 2018-03-24 DIAGNOSIS — Z794 Long term (current) use of insulin: Principal | ICD-10-CM

## 2018-03-24 DIAGNOSIS — L03011 Cellulitis of right finger: Secondary | ICD-10-CM | POA: Diagnosis not present

## 2018-03-24 DIAGNOSIS — I10 Essential (primary) hypertension: Secondary | ICD-10-CM | POA: Diagnosis not present

## 2018-03-24 DIAGNOSIS — E1142 Type 2 diabetes mellitus with diabetic polyneuropathy: Secondary | ICD-10-CM | POA: Diagnosis not present

## 2018-03-24 DIAGNOSIS — E1140 Type 2 diabetes mellitus with diabetic neuropathy, unspecified: Secondary | ICD-10-CM

## 2018-03-24 MED ORDER — HYDROCHLOROTHIAZIDE 12.5 MG PO TABS: 12.5000 mg | ORAL_TABLET | Freq: Every day | ORAL | 0 refills | Status: DC

## 2018-03-24 NOTE — Patient Instructions (Signed)
° ° ° °  If you have lab work done today you will be contacted with your lab results within the next 2 weeks.  If you have not heard from us then please contact us. The fastest way to get your results is to register for [name]. ° ° °IF you received an x-ray today, you will receive an invoice from [facility]. Please contact [facility] at [phone] with questions or concerns regarding your invoice.  ° °IF you received labwork today, you will receive an invoice from [name]. Please contact [name] at [phone] with questions or concerns regarding your invoice.  ° °Our billing staff will not be able to assist you with questions regarding bills from these companies. ° °You will be contacted with the lab results as soon as they are available. The fastest way to get your results is to activate your [name] account. Instructions are located on the last page of this paperwork. If you have not heard from us regarding the results in 2 weeks, please contact this office. °  ° ° ° °

## 2018-04-18 ENCOUNTER — Other Ambulatory Visit: Payer: Self-pay

## 2018-04-18 ENCOUNTER — Ambulatory Visit (INDEPENDENT_AMBULATORY_CARE_PROVIDER_SITE_OTHER): Payer: Managed Care, Other (non HMO) | Admitting: Family Medicine

## 2018-04-18 ENCOUNTER — Ambulatory Visit: Payer: Self-pay

## 2018-04-18 ENCOUNTER — Encounter: Payer: Self-pay | Admitting: Family Medicine

## 2018-04-18 VITALS — BP 136/88 | HR 85 | Temp 98.0°F | Resp 17 | Ht >= 80 in | Wt 376.2 lb

## 2018-04-18 DIAGNOSIS — W57XXXA Bitten or stung by nonvenomous insect and other nonvenomous arthropods, initial encounter: Secondary | ICD-10-CM | POA: Diagnosis not present

## 2018-04-18 DIAGNOSIS — S30861A Insect bite (nonvenomous) of abdominal wall, initial encounter: Secondary | ICD-10-CM | POA: Diagnosis not present

## 2018-04-18 DIAGNOSIS — E1142 Type 2 diabetes mellitus with diabetic polyneuropathy: Secondary | ICD-10-CM

## 2018-04-18 DIAGNOSIS — L309 Dermatitis, unspecified: Secondary | ICD-10-CM

## 2018-04-18 MED ORDER — TRIAMCINOLONE ACETONIDE 0.1 % EX CREA: 1.0000 "application " | TOPICAL_CREAM | Freq: Two times a day (BID) | CUTANEOUS | 0 refills | Status: DC

## 2018-04-18 MED ORDER — INSULIN PEN NEEDLE 32G X 4 MM MISC: 1.0000 | Freq: Three times a day (TID) | 3 refills | Status: DC

## 2018-04-18 NOTE — Telephone Encounter (Signed)
Patient called and says "I have 2 questions. First, is there a stronger corticosteroid for itching that I can use? The medication she gave me is not working. Also, the Neem Oil, can I use that on my skin? It says to put it in water and that's what I did." I advised that the notes by Dr. Creta LevinStallings says to spray with Neem Oil and topical triamcinolone cream. He says "that cream is not working. I haven't tried the Neem Oil yet, but I will spray it on me. I just need to know how often to use the Neem oil and can I use it as a concentrate or is it supposed to be diluted?" I advised I will send his questions to Dr. Creta LevinStallings and someone from the office will call with her recommendation. He says that is there is another cream, just call it in to the pharmacy.   Reason for Disposition . Caller has URGENT medication question about med that PCP prescribed and triager unable to answer question  Protocols used: MEDICATION QUESTION CALL-A-AH

## 2018-04-18 NOTE — Patient Instructions (Addendum)
Spray with Neem Oil found at the hardware store - this is something that can kill dust mites, scabies, fleas    If you have lab work done today you will be contacted with your lab results within the next 2 weeks.  If you have not heard from us then please contact us. The fastest way to get your results is to register for [name].   IF you received an x-ray today, you will [facility] an [facility]. Please contact [facility] at [phone] with questions or concerns regarding your invoice.   IF you received labwork today, you will receive an invoice from [facility]. Please contact [name] at [phone] with questions or concerns regarding your invoice.   Our billing staff will not be able to assist you with questions regarding bills from these companies.  You will be contacted with the lab results as soon as they are available. The fastest way to get your results is to activate your [name] account. Instructions are located on the last page of this paperwork. If you have not heard from us regarding the results in 2 weeks, please contact this office.     Rash A rash is a change in the color of the skin. A rash can also change the way your skin feels. There are many different conditions and factors that can cause a rash. Follow these instructions at home: Pay attention to any changes in your symptoms. Follow these instructions to help with your condition: Medicine Take or apply over-the-counter and prescription medicines only as told by your health care provider. These may include:  Corticosteroid cream.  Anti-itch lotions.  Oral antihistamines.  Skin Care  Apply cool compresses to the affected areas.  Try taking a bath with: ? Epsom salts. Follow the instructions on the packaging. You can get these at your local pharmacy or grocery store. ? Baking soda. Pour a small amount into the bath as told by your health care provider. ? Colloidal oatmeal.  Follow the instructions on the packaging. You can get this at your local pharmacy or grocery store.  Try applying baking soda paste to your skin. Stir water into baking soda until it reaches a paste-like consistency.  Do not scratch or rub your skin.  Avoid covering the rash. Make sure the rash is exposed to air as much as possible. General instructions  Avoid hot showers or baths, which can make itching worse. A cold shower may help.  Avoid scented soaps, detergents, and perfumes. Use gentle soaps, detergents, perfumes, and other cosmetic products.  Avoid any substance that causes your rash. Keep a journal to help track what causes your rash. Write down: ? What you eat. ? What cosmetic products you use. ? What you drink. ? What you wear. This includes jewelry.  Keep all follow-up visits as told by your health care provider. This is important. Contact a health care provider if:  You sweat at night.  You lose weight.  You urinate more than normal.  You feel weak.  You vomit.  Your skin or the whites of your eyes look yellow (jaundice).  Your skin: ? Tingles. ? Is numb.  Your rash: ? Does not go away after several days. ? Gets worse.  You are: ? Unusually thirsty. ? More tired than normal.  You have: ? New symptoms. ? Pain in your abdomen. ? A fever. ? Diarrhea. Get help right away if:  You develop a rash that covers all or most of  your body. The rash may or may not be painful.  You develop blisters that: ? Are on top of the rash. ? Grow larger or grow together. ? Are painful. ? Are inside your nose or mouth.  You develop a rash that: ? Looks like purple pinprick-sized spots all over your body. ? Has a "bull's eye" or looks like a target. ? Is not related to sun exposure, is red and painful, and causes your skin to peel. This information is not intended to replace advice given to you by your health care provider. Make sure you discuss any questions you  have with your health care provider. Document Released: 04/23/2002 Document Revised: 10/07/2015 Document Reviewed: 09/18/2014 Elsevier Interactive Patient Education  2018 [employer].

## 2018-04-18 NOTE — Telephone Encounter (Signed)
Patient called, left VM to return call to the office. 

## 2018-04-18 NOTE — Progress Notes (Signed)
Chief Complaint  Patient presents with  . Urticaria    onset: yesterday, itchy and red, called [facility] and told to take two benadryl but no relief, per pt they look like bites and are on torso, arms and between legs  . Medication Refill    insulin pen needles    HPI Skin rash Patient reports that he noted an itchy rash with red marks like an insect bite He has cats at home and thought it might be fleas so he checked the cats for ticks and fleas and they had none He noticed the spots on his torso, arms and legs as well as buttocks Reports that he took benadryl Reports that he stripped the bed and saw no bugs.  Denies bites and itching in the creases of the skin Denies night time itching -- itches all the time.   Diabetes Mellitus He sees Endocrinology and reports that his last a1c was 7.1% He needs pen needle refills He denies hypoglycemia His last a1c was 01/26/18 by Dr. Hartford Poli   Past Medical History:  Diagnosis Date  . Diabetes ([place])   . GERD (gastroesophageal reflux disease)   . Hyperlipidemia   . Hypertension   . Sleep apnea     Current Outpatient Medications  Medication Sig Dispense Refill  . amLODipine (NORVASC) 10 MG tablet Take 0.5 tablets (5 mg total) by mouth daily. 45 tablet 3  . aspirin 81 MG tablet Take 1 tablet (81 mg total) by mouth daily. 30 tablet   . Blood Glucose Monitoring Suppl (ONE TOUCH ULTRA SYSTEM KIT) w/Device KIT 1 kit by Does not apply route once. Dx. E11.9 1 each 0  . carvedilol (COREG) 6.25 MG tablet TAKE 1 TABLET BY MOUTH TWICE A DAY WITH FOOD 180 tablet 1  . Cholecalciferol (VITAMIN D3) 5000 UNITS CAPS Take 10,000 Units by mouth daily.     Marland Kitchen doxycycline (VIBRA-TABS) 100 MG tablet Take 1 tablet (100 mg total) by mouth 2 (two) times daily. 20 tablet 0  . Dulaglutide (TRULICITY) 1.5 BO/1.7PZ SOPN Inject 1.5 mg into the skin once a week. 6 mL 1  . escitalopram (LEXAPRO) 10 MG tablet TAKE 1 TABLET(10 MG) BY MOUTH DAILY 30 tablet 0  . gabapentin  (NEURONTIN) 300 MG capsule TAKE 1-2 CAPSULES BY MOUTH THREE TIMES A DAY 540 capsule 1  . glucose blood test strip 1 each by Other route 4 (four) times daily. Use with One touch meter to check blood sugar. Dx E11.9 100 each 12  . hydrochlorothiazide (HYDRODIURIL) 12.5 MG tablet Take 1 tablet (12.5 mg total) by mouth daily. 90 tablet 0  . insulin degludec (TRESIBA) 100 UNIT/ML SOPN FlexTouch Pen Inject 80 Units into the skin daily.    . Insulin Pen Needle (BD PEN NEEDLE NANO U/F) 32G X 4 MM MISC Inject 1 each as directed 3 (three) times daily. 300 each 3  . losartan (COZAAR) 100 MG tablet TAKE 1 TABLET BY MOUTH EVERY DAY 90 tablet 1  . omeprazole (PRILOSEC) 40 MG capsule Take 40 mg by mouth daily.    . ONE TOUCH LANCETS MISC 1 each by Does not apply route 4 (four) times daily. Use with one touch meter to check blood sugar. Dx: E11.9 200 each 12  . pravastatin (PRAVACHOL) 20 MG tablet TAKE 1 TABLET BY MOUTH ONCE DAILY 90 tablet 0  . Semaglutide (OZEMPIC, 1 MG/DOSE, [place]) Inject into the skin.    Marland Kitchen traMADol (ULTRAM) 50 MG tablet Take 1 tablet (50 mg total) by  mouth every 8 (eight) hours as needed. (Patient not taking: Reported on 04/18/2018) 15 tablet 0  . triamcinolone cream (KENALOG) 0.1 % Apply 1 application topically 2 (two) times daily. 30 g 0   No current facility-administered medications for this visit.     Allergies:  Allergies  Allergen Reactions  . Penicillins Anaphylaxis, Rash and Other (See Comments)  . Succinylcholine Chloride Anaphylaxis and Rash  . Keflex [Cephalexin] Rash  . Sulfa Antibiotics Rash and Other (See Comments)    Past Surgical History:  Procedure Laterality Date  . CARPAL TUNNEL RELEASE Right 12/28/2016   Procedure: RIGHT ULNAR AND MEDIAN NEUROPLASTY AT WRIST;  Surgeon: Milly Jakob, MD;  Location: [place];  Service: Orthopedics;  Laterality: Right;  . REPLACEMENT TOTAL KNEE BILATERAL  2014   left 2012 rt 2014    Social History    Socioeconomic History  . Marital status: Married    Spouse name: Not on file  . Number of children: 0  . Years of education: Not on file  . Highest education level: Not on file  Occupational History  . Not on file  Social Needs  . Financial resource strain: Not on file  . Food insecurity:    Worry: Not on file    Inability: Not on file  . Transportation needs:    Medical: Not on file    Non-medical: Not on file  Tobacco Use  . Smoking status: Former Smoker    Packs/day: 2.00    Years: 20.00    Pack years: 40.00    Types: Cigarettes    Last attempt to quit: 06/28/1996    Years since quitting: 21.8  . Smokeless tobacco: Never Used  Substance and Sexual Activity  . Alcohol use: Yes    Alcohol/week: 0.0 standard drinks    Comment: occasional  . Drug use: No  . Sexual activity: Not on file  Lifestyle  . Physical activity:    Days per week: Not on file    Minutes per session: Not on file  . Stress: Not on file  Relationships  . Social connections:    Talks on phone: Not on file    Gets together: Not on file    Attends religious service: Not on file    Active member of club or organization: Not on file    Attends meetings of clubs or organizations: Not on file    Relationship status: Not on file  Other Topics Concern  . Not on file  Social History Narrative  . Not on file    Family History  Problem Relation Age of Onset  . Cancer Mother   . Kidney failure Father   . Cancer Brother      ROS Review of Systems See HPI Constitution: No fevers or chills No malaise No diaphoresis Skin: see hpi Eyes: no blurry vision, no double vision GU: no dysuria or hematuria Neuro: no dizziness or headaches  all others reviewed and negative   Objective: Vitals:   04/18/18 0842 04/18/18 0906  BP: (!) 180/95 136/88  Pulse: 85   Resp: 17   Temp: 98 F (36.7 C)   TempSrc: Oral   SpO2: 95%   Weight: (!) 376 lb 3.2 oz (170.6 kg)   Height: 6' 9" (2.057 m)      Physical Exam  Constitutional: He appears well-developed and well-nourished.  HENT:  Head: Normocephalic and atraumatic.  Eyes: Conjunctivae and EOM are normal.  Pulmonary/Chest: Effort normal.  Musculoskeletal: Normal range  of motion. He exhibits no edema.  Skin: Skin is warm. Capillary refill takes less than 2 seconds.  Psychiatric: He has a normal mood and affect. His behavior is normal. Judgment and thought content normal.   Skin with discrete papule, no erythema or induration  Assessment and Plan Nathan Dean was seen today for urticaria and medication refill.  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  Other orders -     triamcinolone cream (KENALOG) 0.1 %; Apply 1 application topically 2 (two) times daily.     [place]

## 2018-04-19 NOTE — Telephone Encounter (Signed)
Please advise 

## 2018-04-19 NOTE — Telephone Encounter (Signed)
Send to Dr. Creta LevinStallings please.

## 2018-04-20 ENCOUNTER — Telehealth: Payer: Self-pay

## 2018-04-20 ENCOUNTER — Encounter: Payer: Self-pay | Admitting: Emergency Medicine

## 2018-04-20 NOTE — Telephone Encounter (Signed)
Pt calling [facility] c/o cream not helping with itchy rash he was seen for on 04/18/18.  Pt irate and upset and threatening to change providers-call transferred to me, asked pt if he was using cream as directed.  Pt states he is and he is still raw and itchy.  Advised pt I would call Dr Creta LevinStallings an advise of issue and contact him back with her response and if new rx is phone in.  Pt agreeable.  Spoke with Creta LevinStallings an advised of issue and she advises to have pt come in for steroid shot as we will work him in.  Called pt back and advised she would like for him to come for steroid injection.  Pt says "Not gonna happen as he has to be to work at 7 am and we aren't open that early."  He says, I asked about a shot while there and she said we would not do a shots".  Per pt "so screw it I am not coming and I'll deal with it". And pt hung up. Dgaddy, CMA

## 2018-05-04 ENCOUNTER — Ambulatory Visit: Payer: Managed Care, Other (non HMO) | Admitting: Neurology

## 2018-05-04 ENCOUNTER — Encounter

## 2018-05-04 ENCOUNTER — Encounter: Payer: Self-pay | Admitting: Neurology

## 2018-05-04 VITALS — BP 144/84 | Ht >= 80 in | Wt 378.0 lb

## 2018-05-04 DIAGNOSIS — E1142 Type 2 diabetes mellitus with diabetic polyneuropathy: Secondary | ICD-10-CM

## 2018-05-04 NOTE — Progress Notes (Signed)
Reason for visit: Diabetic peripheral neuropathy  Referring physician: Dr. Debby Freiberg Nathan Dean is a 62 y.o. male  History of present illness:  Nathan Dean is a 62 year old right-handed white male with a history of diabetes.  The patient has had diabetes for greater than 17 years, at times his diabetes has not been under good control.  He claims that his most recent hemoglobin A1c was 7, but in March 2019 it was 11.  The patient has had gradual progression of neuropathy symptoms over the last 5 years with the lower extremities, over the last 2 years he has had significant issues with the hands.  The patient has had carpal tunnel surgery on the right in August 2019.  He claims that 15 years ago he had the same surgery on the left wrist.  The patient has not gained benefit with the discomfort.  He has a throbbing sensation in the hand that is present during the daytime, he has numbness that affects his ability to type and use a computer.  He takes gabapentin 300 mg twice during the day and 600 mg at night.  He is able to rest fairly well.  He does have gait instability, he uses a cane for ambulation.  He reports no troubles controlling the bowels or the bladder.  He has sleep apnea he is on CPAP.  He feels that the legs are somewhat weak.  He is sent to this office for an evaluation.  Past Medical History:  Diagnosis Date  . Diabetes ([place])   . GERD (gastroesophageal reflux disease)   . Hyperlipidemia   . Hypertension   . Sleep apnea     Past Surgical History:  Procedure Laterality Date  . CARPAL TUNNEL RELEASE Right 12/28/2016   Procedure: RIGHT ULNAR AND MEDIAN NEUROPLASTY AT WRIST;  Surgeon: Milly Jakob, MD;  Location: [place];  Service: Orthopedics;  Laterality: Right;  . REPLACEMENT TOTAL KNEE BILATERAL  2014   left 2012 rt 2014    Family History  Problem Relation Age of Onset  . Cancer Mother   . Kidney failure Father   . Cancer Brother     Social  history:  reports that he quit smoking about 21 years ago. His smoking use included cigarettes. He has a 40.00 pack-year smoking history. He has never used smokeless tobacco. He reports current alcohol use. He reports that he does not use drugs.  Medications:  Prior to Admission medications   Medication Sig Start Date End Date Taking? Authorizing Provider  amLODipine (NORVASC) 10 MG tablet Take 0.5 tablets (5 mg total) by mouth daily. 11/15/17  Yes Sagardia, Ines Bloomer, MD  aspirin 81 MG tablet Take 1 tablet (81 mg total) by mouth daily. 08/11/16  Yes Karamalegos, Devonne Doughty, DO  Blood Glucose Monitoring Suppl (ONE TOUCH ULTRA SYSTEM KIT) w/Device KIT 1 kit by Does not apply route once. Dx. E11.9 07/28/15  Yes Arlis Porta., MD  carvedilol (COREG) 6.25 MG tablet TAKE 1 TABLET BY MOUTH TWICE A DAY WITH FOOD 03/14/18  Yes Sagardia, Ines Bloomer, MD  Cholecalciferol (VITAMIN D3) 5000 UNITS CAPS Take 10,000 Units by mouth daily.    Yes [provider]  doxycycline (VIBRA-TABS) 100 MG tablet Take 1 tablet (100 mg total) by mouth 2 (two) times daily. 03/20/18  Yes Stallings, Zoe A, MD  Dulaglutide (TRULICITY) 1.5 YC/1.4GY SOPN Inject 1.5 mg into the skin once a week. 01/05/17  Yes Wendie Agreste, MD  escitalopram (  LEXAPRO) 10 MG tablet TAKE 1 TABLET(10 MG) BY MOUTH DAILY 07/05/17  Yes Wendie Agreste, MD  gabapentin (NEURONTIN) 300 MG capsule TAKE 1-2 CAPSULES BY MOUTH THREE TIMES A DAY 03/24/18  Yes Sagardia, Ines Bloomer, MD  glucose blood test strip 1 each by Other route 4 (four) times daily. Use with One touch meter to check blood sugar. Dx E11.9 09/13/16  Yes Mikey College, NP  hydrochlorothiazide (HYDRODIURIL) 12.5 MG tablet Take 1 tablet (12.5 mg total) by mouth daily. 03/24/18  Yes Stallings, Zoe A, MD  insulin degludec (TRESIBA) 100 UNIT/ML SOPN FlexTouch Pen Inject 80 Units into the skin daily.   Yes [provider]  Insulin Pen Needle (BD PEN NEEDLE NANO U/F) 32G X 4  MM MISC Inject 1 each as directed 3 (three) times daily. 04/18/18  Yes Delia Chimes A, MD  losartan (COZAAR) 100 MG tablet TAKE 1 TABLET BY MOUTH EVERY DAY 03/14/18  Yes Sagardia, Ines Bloomer, MD  omeprazole (PRILOSEC) 40 MG capsule Take 40 mg by mouth daily.   Yes [provider]  ONE TOUCH LANCETS MISC 1 each by Does not apply route 4 (four) times daily. Use with one touch meter to check blood sugar. Dx: E11.9 09/16/16  Yes Mikey College, NP  pravastatin (PRAVACHOL) 20 MG tablet TAKE 1 TABLET BY MOUTH ONCE DAILY 03/25/17  Yes Wendie Agreste, MD  Semaglutide (OZEMPIC, 1 MG/DOSE, [place]) Inject into the skin.   Yes [provider]  traMADol (ULTRAM) 50 MG tablet Take 1 tablet (50 mg total) by mouth every 8 (eight) hours as needed. 03/14/18  Yes Sagardia, Ines Bloomer, MD  triamcinolone cream (KENALOG) 0.1 % Apply 1 application topically 2 (two) times daily. 04/18/18  Yes Forrest Moron, MD      Allergies  Allergen Reactions  . Penicillins Anaphylaxis, Rash and Other (See Comments)  . Succinylcholine Chloride Anaphylaxis and Rash  . Keflex [Cephalexin] Rash  . Sulfa Antibiotics Rash and Other (See Comments)    ROS:  Out of a complete 14 system review of symptoms, the patient complains only of the following symptoms, and all other reviewed systems are negative.  Swelling in the legs Numbness Snoring  Blood pressure (!) 144/84, height 6' 9"  (2.057 m), weight (!) 378 lb (171.5 kg).  Physical Exam  General: The patient is alert and cooperative at the time of the examination.  Eyes: Pupils are equal, round, and reactive to light. Discs are flat bilaterally.  Neck: The neck is supple, no carotid bruits are noted.  Respiratory: The respiratory examination is clear.  Cardiovascular: The cardiovascular examination reveals a regular rate and rhythm, no obvious murmurs or rubs are noted.  Skin: Extremities are with 3+ edema below the knees  bilaterally.  Neurologic Exam  Mental status: The patient is alert and oriented x 3 at the time of the examination. The patient has apparent normal recent and remote memory, with an apparently normal attention span and concentration ability.  Cranial nerves: Facial symmetry is present. There is good sensation of the face to pinprick and soft touch bilaterally. The strength of the facial muscles and the muscles to head turning and shoulder shrug are normal bilaterally. Speech is well enunciated, no aphasia or dysarthria is noted. Extraocular movements are full. Visual fields are full. The tongue is midline, and the patient has symmetric elevation of the soft palate. No obvious hearing deficits are noted.  Motor: The motor testing reveals 5 over 5 strength of all 4  extremities, with exception of 4/5 strength of intrinsic muscles of the left hand, trace weakness on the right, APB muscles are weak bilaterally.  With the lower extremities, the patient has bilateral foot drops. Good symmetric motor tone is noted throughout.  Sensory: Sensory testing is intact to pinprick, soft touch, vibration sensation, and position sense on the upper extremities.  With the lower extremities, there is a stocking pattern pinprick sensory deficit in the distal third of the legs bilaterally with marketed impairment of vibration and position sense in both feet.  No evidence of extinction is noted.  Coordination: Cerebellar testing reveals good finger-nose-finger and heel-to-shin bilaterally.  Gait and station: Gait is wide-based, unsteady, he usually uses a cane for ambulation.  Tandem gait was not attempted.  Romberg is negative but is unsteady.  Reflexes: Deep tendon reflexes are symmetric, but are depressed bilaterally. Toes are downgoing bilaterally.   Assessment/Plan:  1.  Diabetic peripheral neuropathy, severe  2.  Gait disturbance  The patient is having some discomfort in the hands bilaterally during the day,  he denies issues with pain in the feet or legs.  The patient will go up on the gabapentin taking 600 mg twice during the day and 300 mg in midday.  The patient will call for any dose adjustments.  If the gabapentin is not effective or is not tolerated, Cymbalta will be added to the dosing regimen.  He will otherwise follow-up in 5 months.  Jill Alexanders MD 05/04/2018 1:45 PM  [facility] [address] [place], [place] [address]  Phone [phone] Fax [phone]

## 2018-05-04 NOTE — Patient Instructions (Signed)
We will go up on the gabapentin to 600 mg in the morning and evening and 300 mg midday.  Call for any dose adjustments.  Neurontin (gabapentin) may result in drowsiness, ankle swelling, gait instability, or possibly dizziness. Please contact our office if significant side effects occur with this medication.

## 2018-05-09 LAB — B. BURGDORFI ANTIBODIES: Lyme IgG/IgM Ab: <0.91 {ISR} (ref 0.00–0.90)

## 2018-05-09 LAB — MULTIPLE MYELOMA PANEL, SERUM
Albumin SerPl Elph-Mcnc: 3.8 g/dL (ref 2.9–4.4)
Albumin/Glob SerPl: 1.4 (ref 0.7–1.7)
Alpha 1: 0.2 g/dL (ref 0.0–0.4)
Alpha2 Glob SerPl Elph-Mcnc: 0.9 g/dL (ref 0.4–1.0)
B-Globulin SerPl Elph-Mcnc: 1.1 g/dL (ref 0.7–1.3)
Gamma Glob SerPl Elph-Mcnc: 0.7 g/dL (ref 0.4–1.8)
Globulin, Total: 2.9 g/dL (ref 2.2–3.9)
IgA/Immunoglobulin A, Serum: 152 mg/dL (ref 61–437)
IgG (Immunoglobin G), Serum: 830 mg/dL (ref 700–1600)
IgM (Immunoglobulin M), Srm: 54 mg/dL (ref 20–172)
Total Protein: 6.7 g/dL (ref 6.0–8.5)

## 2018-05-09 LAB — ANGIOTENSIN CONVERTING ENZYME: ANGIO CONVERT ENZYME: 56 U/L (ref 14–82)

## 2018-05-09 LAB — ANA W/REFLEX: Anti Nuclear Antibody(ANA): NEGATIVE

## 2018-05-09 LAB — VITAMIN B12: Vitamin B-12: 417 pg/mL (ref 232–1245)

## 2018-05-09 LAB — SEDIMENTATION RATE: SED RATE: 7 mm/h (ref 0–30)

## 2018-05-26 ENCOUNTER — Other Ambulatory Visit: Payer: Self-pay

## 2018-05-26 ENCOUNTER — Encounter: Payer: Self-pay | Admitting: Emergency Medicine

## 2018-05-26 ENCOUNTER — Ambulatory Visit: Payer: Managed Care, Other (non HMO) | Admitting: Emergency Medicine

## 2018-05-26 VITALS — BP 139/79 | HR 82 | Temp 98.5°F | Resp 16 | Wt 378.9 lb

## 2018-05-26 DIAGNOSIS — M7711 Lateral epicondylitis, right elbow: Secondary | ICD-10-CM | POA: Diagnosis not present

## 2018-05-26 DIAGNOSIS — J0100 Acute maxillary sinusitis, unspecified: Secondary | ICD-10-CM | POA: Diagnosis not present

## 2018-05-26 DIAGNOSIS — J3489 Other specified disorders of nose and nasal sinuses: Secondary | ICD-10-CM

## 2018-05-26 DIAGNOSIS — R51 Headache: Secondary | ICD-10-CM

## 2018-05-26 DIAGNOSIS — R519 Headache, unspecified: Secondary | ICD-10-CM

## 2018-05-26 MED ORDER — PREDNISONE 20 MG PO TABS: 40.0000 mg | ORAL_TABLET | Freq: Every day | ORAL | 0 refills | Status: AC

## 2018-05-26 MED ORDER — TRIAMCINOLONE ACETONIDE 55 MCG/ACT NA AERO: 2.0000 | INHALATION_SPRAY | Freq: Every day | NASAL | 12 refills | Status: DC

## 2018-05-26 MED ORDER — DOXYCYCLINE HYCLATE 100 MG PO TABS: 100.0000 mg | ORAL_TABLET | Freq: Two times a day (BID) | ORAL | 0 refills | Status: DC

## 2018-05-26 NOTE — Patient Instructions (Addendum)
If you have lab work done today you will be contacted with your lab results within the next 2 weeks.  If you have not heard from [name] then please contact us. The fastest way to get your results is to register for [name].   IF you received an x-ray today, you will receive an invoice from [name]. Please contact [name] at [phone] with questions or concerns regarding your invoice.   IF you received labwork today, you will receive an invoice from [name]. Please contact [name] at [phone] with questions or concerns regarding your invoice.   Our billing staff will not be able to assist you with questions regarding bills from these companies.  You will be contacted with the lab results as soon as they are available. The fastest way to get your results is to activate your [name] account. Instructions are located on the last page of this paperwork. If you have not heard from [name] regarding the results in 2 weeks, please contact this office.     Sinusitis, Adult Sinusitis is soreness and swelling (inflammation) of your sinuses. Sinuses are hollow spaces in the bones around your face. They are located:  Around your eyes.  In the middle of your forehead.  Behind your nose.  In your cheekbones. Your sinuses and nasal passages are lined with a fluid called mucus. Mucus drains out of your sinuses. Swelling can trap mucus in your sinuses. This lets germs (bacteria, virus, or fungus) grow, which leads to infection. Most of the time, this condition is caused by a virus. What are the causes? This condition is caused by:  Allergies.  Asthma.  Germs.  Things that block your nose or sinuses.  Growths in the nose (nasal polyps).  Chemicals or irritants in the air.  Fungus (rare). What increases the risk? You are more likely to develop this condition if:  You have a weak body defense system (immune system).  You do a lot of swimming or  diving.  You use nasal sprays too much.  You smoke. What are the signs or symptoms? The main symptoms of this condition are pain and a feeling of pressure around the sinuses. Other symptoms include:  Stuffy nose (congestion).  Runny nose (drainage).  Swelling and warmth in the sinuses.  Headache.  Toothache.  A cough that may get worse at night.  Mucus that collects in the throat or the back of the nose (postnasal drip).  Being unable to smell and taste.  Being very tired (fatigue).  A fever.  Sore throat.  Bad breath. How is this diagnosed? This condition is diagnosed based on:  Your symptoms.  Your medical history.  A physical exam.  Tests to find out if your condition is short-term (acute) or long-term (chronic). Your doctor may: ? Check your nose for growths (polyps). ? Check your sinuses using a tool that has a light (endoscope). ? Check for allergies or germs. ? Do imaging tests, such as an MRI or CT scan. How is this treated? Treatment for this condition depends on the cause and whether it is short-term or long-term.  If caused by a virus, your symptoms should go away on their own within 10 days. You may be given medicines to relieve symptoms. They include: ? Medicines that shrink swollen tissue in the nose. ? Medicines that treat allergies (antihistamines). ? A spray that treats swelling of the nostrils. ? Rinses that help get rid of thick mucus in your  nose (nasal saline washes).  If caused by bacteria, your doctor may wait to see if you will get better without treatment. You may be given antibiotic medicine if you have: ? A very bad infection. ? A weak body defense system.  If caused by growths in the nose, you may need to have surgery. Follow these instructions at home: Medicines  Take, use, or apply over-the-counter and prescription medicines only as told by your doctor. These may include nasal sprays.  If you were prescribed an antibiotic  medicine, take it as told by your doctor. Do not stop taking the antibiotic even if you start to feel better. Hydrate and humidify   Drink enough water to keep your pee (urine) pale yellow.  Use a cool mist humidifier to keep the humidity level in your home above 50%.  Breathe in steam for 10-15 minutes, 3-4 times a day, or as told by your doctor. You can do this in the bathroom while a hot shower is running.  Try not to spend time in cool or dry air. Rest  Rest as much as you can.  Sleep with your head raised (elevated).  Make sure you get enough sleep each night. General instructions   Put a warm, moist washcloth on your face 3-4 times a day, or as often as told by your doctor. This will help with discomfort.  Wash your hands often with soap and water. If there is no soap and water, use hand sanitizer.  Do not smoke. Avoid being around people who are smoking (secondhand smoke).  Keep all follow-up visits as told by your doctor. This is important. Contact a doctor if:  You have a fever.  Your symptoms get worse.  Your symptoms do not get better within 10 days. Get help right away if:  You have a very bad headache.  You cannot stop throwing up (vomiting).  You have very bad pain or swelling around your face or eyes.  You have trouble seeing.  You feel confused.  Your neck is stiff.  You have trouble breathing. Summary  Sinusitis is swelling of your sinuses. Sinuses are hollow spaces in the bones around your face.  This condition is caused by tissues in your nose that become inflamed or swollen. This traps germs. These can lead to infection.  If you were prescribed an antibiotic medicine, take it as told by your doctor. Do not stop taking it even if you start to feel better.  Keep all follow-up visits as told by your doctor. This is important. This information is not intended to replace advice given to you by your health care provider. Make sure you discuss  any questions you have with your health care provider. Document Released: 10/20/2007 Document Revised: 10/03/2017 Document Reviewed: 10/03/2017 Elsevier Interactive Patient Education  [name]. He can go Tennis Elbow Tennis elbow is swelling (inflammation) in your outer forearm, near your elbow. Swelling affects the tissues that connect muscle to bone (tendons). Tennis elbow can happen in any sport or job in which you use your elbow too much. It is caused by doing the same motion over and over. Tennis elbow can cause:  Pain and tenderness in your forearm and the outer part of your elbow. You may have pain all the time, or only when using the arm.  A burning feeling. This runs from your elbow through your arm.  Weak grip in your hand. Follow these instructions at home: Activity  Rest your elbow and wrist.  Avoid activities that cause problems, as told by your doctor.  If told by your doctor, wear an elbow strap to reduce stress on the area.  Do physical therapy exercises as told.  If you lift an object, lift it with your palm facing up. This is easier on your elbow. Lifestyle  If your tennis elbow is caused by sports, check your equipment and make sure that: ? You are using it correctly. ? It fits you well.  If your tennis elbow is caused by work or by using a computer, take breaks often to stretch your arm. Talk with your manager about how you can manage your condition at work. If you have a brace:  Wear the brace as told by your doctor. Remove it only as told by your doctor.  Loosen the brace if your fingers tingle, get numb, or turn cold and blue.  Keep the brace clean.  If the brace is not waterproof, ask your doctor if you may take the brace off for bathing. If you must keep the brace on while bathing: ? Do not let it get wet. ? Cover it with a watertight covering when you take a bath or a shower. General instructions   If told, put ice on the painful  area: ? Put ice in a plastic bag. ? Place a towel between your skin and the bag. ? Leave the ice on for 20 minutes, 2-3 times a day.  Take over-the-counter and prescription medicines only as told by your doctor.  Keep all follow-up visits as told by your doctor. This is important. Contact a doctor if:  Your pain does not get better with treatment.  Your pain gets worse.  You have weakness in your forearm, hand, or fingers.  You cannot feel your forearm, hand, or fingers. Summary  Tennis elbow is swelling (inflammation) in your outer forearm, near your elbow.  Tennis elbow is caused by doing the same motion over and over.  Rest your elbow and wrist. Avoid activities that cause problems, as told by your doctor.  If told, put ice on the painful area for 20 minutes, 2-3 times a day. This information is not intended to replace advice given to you by your health care provider. Make sure you discuss any questions you have with your health care provider. Document Released: 10/21/2009 Document Revised: 02/15/2017 Document Reviewed: 02/15/2017 Elsevier Interactive Patient Education  2019 [employer]

## 2018-05-26 NOTE — Progress Notes (Signed)
Nathan Dean 63 y.o.   Chief Complaint  Patient presents with  . Headache    x 2 weeks with green mucus when blowing his nose  . Sinus Problem    sinus pressure and  RIGHT pain x 1 month    HISTORY OF PRESENT ILLNESS: This is a 63 y.o. male complaining of sinus pressure and headache for the past [redacted] weeks along with nasal green discharge. Also complaining of pain to right outer arm/elbow for several weeks.  Denies injury.  HPI   Prior to Admission medications   Medication Sig Start Date End Date Taking? Authorizing Provider  amLODipine (NORVASC) 10 MG tablet Take 0.5 tablets (5 mg total) by mouth daily. 11/15/17  Yes , Ines Bloomer, MD  aspirin 81 MG tablet Take 1 tablet (81 mg total) by mouth daily. 08/11/16  Yes Karamalegos, Devonne Doughty, DO  Blood Glucose Monitoring Suppl (ONE TOUCH ULTRA SYSTEM KIT) w/Device KIT 1 kit by Does not apply route once. Dx. E11.9 07/28/15  Yes Arlis Porta., MD  carvedilol (COREG) 6.25 MG tablet TAKE 1 TABLET BY MOUTH TWICE A DAY WITH FOOD 03/14/18  Yes , Ines Bloomer, MD  Cholecalciferol (VITAMIN D3) 5000 UNITS CAPS Take 10,000 Units by mouth daily.    Yes [provider]  escitalopram (LEXAPRO) 10 MG tablet TAKE 1 TABLET(10 MG) BY MOUTH DAILY 07/05/17  Yes Wendie Agreste, MD  gabapentin (NEURONTIN) 300 MG capsule TAKE 1-2 CAPSULES BY MOUTH THREE TIMES A DAY 03/24/18  Yes , Ines Bloomer, MD  glucose blood test strip 1 each by Other route 4 (four) times daily. Use with One touch meter to check blood sugar. Dx E11.9 09/13/16  Yes Mikey College, NP  hydrochlorothiazide (HYDRODIURIL) 12.5 MG tablet Take 1 tablet (12.5 mg total) by mouth daily. 03/24/18  Yes Stallings, Zoe A, MD  insulin degludec (TRESIBA) 100 UNIT/ML SOPN FlexTouch Pen Inject 80 Units into the skin daily.   Yes [provider]  Insulin Pen Needle (BD PEN NEEDLE NANO U/F) 32G X 4 MM MISC Inject 1 each as directed 3 (three) times daily. 04/18/18   Yes Delia Chimes A, MD  losartan (COZAAR) 100 MG tablet TAKE 1 TABLET BY MOUTH EVERY DAY 03/14/18  Yes , Ines Bloomer, MD  omeprazole (PRILOSEC) 40 MG capsule Take 40 mg by mouth daily.   Yes [provider]  ONE TOUCH LANCETS MISC 1 each by Does not apply route 4 (four) times daily. Use with one touch meter to check blood sugar. Dx: E11.9 09/16/16  Yes Mikey College, NP  pravastatin (PRAVACHOL) 20 MG tablet TAKE 1 TABLET BY MOUTH ONCE DAILY 03/25/17  Yes Wendie Agreste, MD  Semaglutide (OZEMPIC, 1 MG/DOSE, [place]) Inject into the skin.   Yes [provider]  triamcinolone cream (KENALOG) 0.1 % Apply 1 application topically 2 (two) times daily. 04/18/18  Yes Stallings, Zoe A, MD  Dulaglutide (TRULICITY) 1.5 UU/7.2ZD SOPN Inject 1.5 mg into the skin once a week. Patient not taking: Reported on 05/26/2018 01/05/17   Wendie Agreste, MD  traMADol (ULTRAM) 50 MG tablet Take 1 tablet (50 mg total) by mouth every 8 (eight) hours as needed. Patient not taking: Reported on 05/26/2018 03/14/18   Horald Pollen, MD    Allergies  Allergen Reactions  . Penicillins Anaphylaxis, Rash and Other (See Comments)  . Succinylcholine Chloride Anaphylaxis and Rash  . Keflex [Cephalexin] Rash  . Sulfa Antibiotics Rash and Other (See Comments)  Patient Active Problem List   Diagnosis Date Noted  . Chest congestion 07/20/2017  . Viral illness 07/20/2017  . Lower respiratory infection 07/12/2017  . Laryngitis, acute 07/12/2017  . Cough 07/12/2017  . Pleurisy 07/12/2017  . Depression 06/03/2015  . HTN (hypertension) 01/02/2015  . Hyperlipidemia 01/02/2015  . 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 Sleep apnea 09/21/2010  . Arthritis, degenerative 02/17/2009  . Hereditary and idiopathic peripheral neuropathy 11/21/2008  . AD (atopic dermatitis) 07/08/2008  . Acid reflux 04/05/2007    Past Medical History:  Diagnosis Date    . Diabetes ([place])   . GERD (gastroesophageal reflux disease)   . Hyperlipidemia   . Hypertension   . Sleep apnea     Past Surgical History:  Procedure Laterality Date  . CARPAL TUNNEL RELEASE Right 12/28/2016   Procedure: RIGHT ULNAR AND MEDIAN NEUROPLASTY AT WRIST;  Surgeon: Milly Jakob, MD;  Location: [facility];  Service: Orthopedics;  Laterality: Right;  . REPLACEMENT TOTAL KNEE BILATERAL  2014   left 2012 rt 2014    Social History   Socioeconomic History  . Marital status: Married    Spouse name: Not on file  . Number of children: 0  . Years of education: Not on file  . Highest education level: Associate degree: academic program  Occupational History  . Occupation: Currently Working   Social Needs  . Financial resource strain: Not on file  . Food insecurity:    Worry: Not on file    Inability: Not on file  . Transportation needs:    Medical: Not on file    Non-medical: Not on file  Tobacco Use  . Smoking status: Former Smoker    Packs/day: 2.00    Years: 20.00    Pack years: 40.00    Types: Cigarettes    Last attempt to quit: 06/28/1996    Years since quitting: 21.9  . Smokeless tobacco: Never Used  Substance and Sexual Activity  . Alcohol use: Yes    Alcohol/week: 0.0 standard drinks    Comment: occasional  . Drug use: No  . Sexual activity: Not on file  Lifestyle  . Physical activity:    Days per week: Not on file    Minutes per session: Not on file  . Stress: Not on file  Relationships  . Social connections:    Talks on phone: Not on file    Gets together: Not on file    Attends religious service: Not on file    Active member of club or organization: Not on file    Attends meetings of clubs or organizations: Not on file    Relationship status: Not on file  . Intimate partner violence:    Fear of current or ex partner: Not on file    Emotionally abused: Not on file    Physically abused: Not on file    Forced sexual activity:  Not on file  Other Topics Concern  . Not on file  Social History Narrative   Right handed    Lives at home spouse    Caffeine 5 -6 cups      Family History  Problem Relation Age of Onset  . Cancer Mother   . Kidney failure Father   . Cancer Brother      Review of Systems  Constitutional: Negative for chills and fever.  HENT: Positive for congestion and sinus pain.  Nasal discharge  Eyes: Negative.  Negative for discharge and redness.  Respiratory: Negative.  Negative for cough and shortness of breath.   Cardiovascular: Negative.  Negative for chest pain and palpitations.  Gastrointestinal: Negative.  Negative for abdominal pain, diarrhea, nausea and vomiting.  Genitourinary: Negative.   Musculoskeletal: Negative.  Negative for back pain, myalgias and neck pain.       Right elbow/arm pain  Skin: Negative.  Negative for rash.  Neurological: Positive for headaches. Negative for dizziness and sensory change.  Endo/Heme/Allergies: Negative.   All other systems reviewed and are negative.  Vitals:   05/26/18 1324  BP: 139/79  Pulse: 82  Resp: 16  Temp: 98.5 F (36.9 C)  SpO2: 95%     Physical Exam Vitals signs reviewed.  Constitutional:      Appearance: He is well-developed.  HENT:     Head: Normocephalic and atraumatic.     Nose: Congestion present.     Right Sinus: Maxillary sinus tenderness present.     Left Sinus: Maxillary sinus tenderness present.     Mouth/Throat:     Mouth: Mucous membranes are moist.     Pharynx: Oropharynx is clear.  Eyes:     Extraocular Movements: Extraocular movements intact.     Pupils: Pupils are equal, round, and reactive to light.  Neck:     Musculoskeletal: Normal range of motion.  Cardiovascular:     Rate and Rhythm: Normal rate and regular rhythm.     Heart sounds: Normal heart sounds.  Pulmonary:     Effort: Pulmonary effort is normal.     Breath sounds: Normal breath sounds.  Musculoskeletal: Normal range of  motion.     Comments: Tender right lateral epicondyle  Skin:    General: Skin is warm and dry.  Neurological:     General: No focal deficit present.     Mental Status: He is alert and oriented to person, place, and time.  Psychiatric:        Mood and Affect: Mood normal.        Behavior: Behavior normal.     A total of 25 minutes was spent in the room with the patient, greater than 50% of which was in counseling/coordination of care regarding diagnosis, treatment, medications, and need for follow-up if no better or worse.   ASSESSMENT & PLAN: Nathan Dean was seen today for headache and sinus problem.  Diagnoses and all orders for this visit:  Acute non-recurrent maxillary sinusitis -     doxycycline (VIBRA-TABS) 100 MG tablet; Take 1 tablet (100 mg total) by mouth 2 (two) times daily.  Sinus pressure -     predniSONE (DELTASONE) 20 MG tablet; Take 2 tablets (40 mg total) by mouth daily with breakfast for 5 days. -     triamcinolone (NASACORT) 55 MCG/ACT AERO nasal inhaler; Place 2 sprays into the nose daily.  Sinus headache  Right lateral epicondylitis    Patient Instructions       If you have lab work done today you will be contacted with your lab results within the next 2 weeks.  If you have not heard from [name] then please contact us. The fastest way to get your results is to register for [name].   IF you received an x-ray today, you will receive an invoice from [facility]. Please contact [facility] at [phone] with questions or concerns regarding your invoice.   IF you received labwork today, you will receive an invoice from [facility].  Please contact [name] at [phone] with questions or concerns regarding your invoice.   Our billing staff will not be able to assist you with questions regarding bills from these companies.  You will be contacted with the lab results as soon as they are available. The fastest way to get your results is to  activate your [name] account. Instructions are located on the last page of this paperwork. If you have not heard from [name] regarding the results in 2 weeks, please contact this office.     Sinusitis, Adult Sinusitis is soreness and swelling (inflammation) of your sinuses. Sinuses are hollow spaces in the bones around your face. They are located:  Around your eyes.  In the middle of your forehead.  Behind your nose.  In your cheekbones. Your sinuses and nasal passages are lined with a fluid called mucus. Mucus drains out of your sinuses. Swelling can trap mucus in your sinuses. This lets germs (bacteria, virus, or fungus) grow, which leads to infection. Most of the time, this condition is caused by a virus. What are the causes? This condition is caused by:  Allergies.  Asthma.  Germs.  Things that block your nose or sinuses.  Growths in the nose (nasal polyps).  Chemicals or irritants in the air.  Fungus (rare). What increases the risk? You are more likely to develop this condition if:  You have a weak body defense system (immune system).  You do a lot of swimming or diving.  You use nasal sprays too much.  You smoke. What are the signs or symptoms? The main symptoms of this condition are pain and a feeling of pressure around the sinuses. Other symptoms include:  Stuffy nose (congestion).  Runny nose (drainage).  Swelling and warmth in the sinuses.  Headache.  Toothache.  A cough that may get worse at night.  Mucus that collects in the throat or the back of the nose (postnasal drip).  Being unable to smell and taste.  Being very tired (fatigue).  A fever.  Sore throat.  Bad breath. How is this diagnosed? This condition is diagnosed based on:  Your symptoms.  Your medical history.  A physical exam.  Tests to find out if your condition is short-term (acute) or long-term (chronic). Your doctor may: ? Check your nose for growths  (polyps). ? Check your sinuses using a tool that has a light (endoscope). ? Check for allergies or germs. ? Do imaging tests, such as an MRI or CT scan. How is this treated? Treatment for this condition depends on the cause and whether it is short-term or long-term.  If caused by a virus, your symptoms should go away on their own within 10 days. You may be given medicines to relieve symptoms. They include: ? Medicines that shrink swollen tissue in the nose. ? Medicines that treat allergies (antihistamines). ? A spray that treats swelling of the nostrils. ? Rinses that help get rid of thick mucus in your nose (nasal saline washes).  If caused by bacteria, your doctor may wait to see if you will get better without treatment. You may be given antibiotic medicine if you have: ? A very bad infection. ? A weak body defense system.  If caused by growths in the nose, you may need to have surgery. Follow these instructions at home: Medicines  Take, use, or apply over-the-counter and prescription medicines only as told by your doctor. These may include nasal sprays.  If you were prescribed an antibiotic medicine,  take it as told by your doctor. Do not stop taking the antibiotic even if you start to feel better. Hydrate and humidify   Drink enough water to keep your pee (urine) pale yellow.  Use a cool mist humidifier to keep the humidity level in your home above 50%.  Breathe in steam for 10-15 minutes, 3-4 times a day, or as told by your doctor. You can do this in the bathroom while a hot shower is running.  Try not to spend time in cool or dry air. Rest  Rest as much as you can.  Sleep with your head raised (elevated).  Make sure you get enough sleep each night. General instructions   Put a warm, moist washcloth on your face 3-4 times a day, or as often as told by your doctor. This will help with discomfort.  Wash your hands often with soap and water. If there is no soap and  water, use hand sanitizer.  Do not smoke. Avoid being around people who are smoking (secondhand smoke).  Keep all follow-up visits as told by your doctor. This is important. Contact a doctor if:  You have a fever.  Your symptoms get worse.  Your symptoms do not get better within 10 days. Get help right away if:  You have a very bad headache.  You cannot stop throwing up (vomiting).  You have very bad pain or swelling around your face or eyes.  You have trouble seeing.  You feel confused.  Your neck is stiff.  You have trouble breathing. Summary  Sinusitis is swelling of your sinuses. Sinuses are hollow spaces in the bones around your face.  This condition is caused by tissues in your nose that become inflamed or swollen. This traps germs. These can lead to infection.  If you were prescribed an antibiotic medicine, take it as told by your doctor. Do not stop taking it even if you start to feel better.  Keep all follow-up visits as told by your doctor. This is important. This information is not intended to replace advice given to you by your health care provider. Make sure you discuss any questions you have with your health care provider. Document Released: 10/20/2007 Document Revised: 10/03/2017 Document Reviewed: 10/03/2017 Elsevier Interactive Patient Education  [employer]. He can go Tennis Elbow Tennis elbow is swelling (inflammation) in your outer forearm, near your elbow. Swelling affects the tissues that connect muscle to bone (tendons). Tennis elbow can happen in any sport or job in which you use your elbow too much. It is caused by doing the same motion over and over. Tennis elbow can cause:  Pain and tenderness in your forearm and the outer part of your elbow. You may have pain all the time, or only when using the arm.  A burning feeling. This runs from your elbow through your arm.  Weak grip in your hand. Follow these instructions at  home: Activity  Rest your elbow and wrist. Avoid activities that cause problems, as told by your doctor.  If told by your doctor, wear an elbow strap to reduce stress on the area.  Do physical therapy exercises as told.  If you lift an object, lift it with your palm facing up. This is easier on your elbow. Lifestyle  If your tennis elbow is caused by sports, check your equipment and make sure that: ? You are using it correctly. ? It fits you well.  If your tennis elbow is caused by work or  by using a computer, take breaks often to stretch your arm. Talk with your manager about how you can manage your condition at work. If you have a brace:  Wear the brace as told by your doctor. Remove it only as told by your doctor.  Loosen the brace if your fingers tingle, get numb, or turn cold and blue.  Keep the brace clean.  If the brace is not waterproof, ask your doctor if you may take the brace off for bathing. If you must keep the brace on while bathing: ? Do not let it get wet. ? Cover it with a watertight covering when you take a bath or a shower. General instructions   If told, put ice on the painful area: ? Put ice in a plastic bag. ? Place a towel between your skin and the bag. ? Leave the ice on for 20 minutes, 2-3 times a day.  Take over-the-counter and prescription medicines only as told by your doctor.  Keep all follow-up visits as told by your doctor. This is important. Contact a doctor if:  Your pain does not get better with treatment.  Your pain gets worse.  You have weakness in your forearm, hand, or fingers.  You cannot feel your forearm, hand, or fingers. Summary  Tennis elbow is swelling (inflammation) in your outer forearm, near your elbow.  Tennis elbow is caused by doing the same motion over and over.  Rest your elbow and wrist. Avoid activities that cause problems, as told by your doctor.  If told, put ice on the painful area for 20 minutes, 2-3  times a day. This information is not intended to replace advice given to you by your health care provider. Make sure you discuss any questions you have with your health care provider. Document Released: 10/21/2009 Document Revised: 02/15/2017 Document Reviewed: 02/15/2017 Elsevier Interactive Patient Education  2019 Elsevier Inc.      Agustina Caroli, MD [facility]

## 2018-06-15 ENCOUNTER — Other Ambulatory Visit: Payer: Self-pay | Admitting: Family Medicine

## 2018-06-15 NOTE — Telephone Encounter (Signed)
Requested Prescriptions  Pending Prescriptions Disp Refills  . hydrochlorothiazide (HYDRODIURIL) 12.5 MG tablet [Pharmacy Med Name: HYDROCHLOROTHIAZIDE 12.5 MG TB] 90 tablet 0    Sig: TAKE 1 TABLET BY MOUTH EVERY DAY     Cardiovascular: Diuretics - Thiazide Passed - 06/15/2018  2:10 AM      Passed - Ca in normal range and within 360 days    Calcium  Date Value Ref Range Status  07/20/2017 9.7 8.6 - 10.2 mg/dL Final   Calcium, Total  Date Value Ref Range Status  06/25/2012 8.1 (L) 8.5 - 10.1 mg/dL Final         Passed - Cr in normal range and within 360 days    Creatinine  Date Value Ref Range Status  06/25/2012 0.66 0.60 - 1.30 mg/dL Final   Creatinine, Ser  Date Value Ref Range Status  07/20/2017 0.87 0.76 - 1.27 mg/dL Final         Passed - K in normal range and within 360 days    Potassium  Date Value Ref Range Status  07/20/2017 4.2 3.5 - 5.2 mmol/L Final  06/25/2012 3.8 3.5 - 5.1 mmol/L Final         Passed - Na in normal range and within 360 days    Sodium  Date Value Ref Range Status  07/20/2017 140 134 - 144 mmol/L Final  06/25/2012 135 (L) 136 - 145 mmol/L Final         Passed - Last BP in normal range    BP Readings from Last 1 Encounters:  05/26/18 139/79         Passed - Valid encounter within last 6 months    Recent Outpatient Visits          2 weeks ago Acute non-recurrent maxillary sinusitis   [facility] at [facility], Eilleen Kempf, MD   1 month ago Dermatitis   Primary Care at [facility], Manus Rudd, MD   2 months ago Paronychia of finger of right hand   Primary Care at [facility], Manus Rudd, MD   2 months ago Paronychia of finger of right hand   Primary Care at [facility], Manus Rudd, MD   3 months ago Pain in both hands   Primary Care at [facility], Eilleen Kempf, MD

## 2018-07-19 DIAGNOSIS — Z9181 History of falling: Secondary | ICD-10-CM | POA: Insufficient documentation

## 2018-07-19 HISTORY — DX: History of falling: Z91.81

## 2018-08-16 ENCOUNTER — Other Ambulatory Visit: Payer: Self-pay | Admitting: Emergency Medicine

## 2018-08-16 DIAGNOSIS — I10 Essential (primary) hypertension: Secondary | ICD-10-CM

## 2018-09-18 ENCOUNTER — Other Ambulatory Visit: Payer: Self-pay | Admitting: Emergency Medicine

## 2018-09-18 DIAGNOSIS — Z794 Long term (current) use of insulin: Principal | ICD-10-CM

## 2018-09-18 DIAGNOSIS — E1140 Type 2 diabetes mellitus with diabetic neuropathy, unspecified: Secondary | ICD-10-CM

## 2018-10-03 ENCOUNTER — Ambulatory Visit: Payer: Managed Care, Other (non HMO) | Admitting: Neurology

## 2018-10-18 ENCOUNTER — Inpatient Hospital Stay
Admission: EM | Admit: 2018-10-18 | Discharge: 2018-10-21 | DRG: 871 | Disposition: A | Discharge status: Home Health | Payer: Managed Care, Other (non HMO) | Attending: Internal Medicine | Admitting: Internal Medicine

## 2018-10-18 ENCOUNTER — Emergency Department: Payer: Managed Care, Other (non HMO)

## 2018-10-18 ENCOUNTER — Encounter: Payer: Self-pay | Admitting: Emergency Medicine

## 2018-10-18 ENCOUNTER — Other Ambulatory Visit: Payer: Self-pay

## 2018-10-18 DIAGNOSIS — S80211A Abrasion, right knee, initial encounter: Secondary | ICD-10-CM | POA: Diagnosis present

## 2018-10-18 DIAGNOSIS — Z6839 Body mass index (BMI) 39.0-39.9, adult: Secondary | ICD-10-CM

## 2018-10-18 DIAGNOSIS — G4733 Obstructive sleep apnea (adult) (pediatric): Secondary | ICD-10-CM | POA: Diagnosis present

## 2018-10-18 DIAGNOSIS — J440 Chronic obstructive pulmonary disease with acute lower respiratory infection: Secondary | ICD-10-CM | POA: Diagnosis present

## 2018-10-18 DIAGNOSIS — J9601 Acute respiratory failure with hypoxia: Secondary | ICD-10-CM | POA: Diagnosis present

## 2018-10-18 DIAGNOSIS — M25552 Pain in left hip: Secondary | ICD-10-CM | POA: Diagnosis present

## 2018-10-18 DIAGNOSIS — Y92009 Unspecified place in unspecified non-institutional (private) residence as the place of occurrence of the external cause: Secondary | ICD-10-CM

## 2018-10-18 DIAGNOSIS — Z87891 Personal history of nicotine dependence: Secondary | ICD-10-CM

## 2018-10-18 DIAGNOSIS — Z87892 Personal history of anaphylaxis: Secondary | ICD-10-CM

## 2018-10-18 DIAGNOSIS — R296 Repeated falls: Secondary | ICD-10-CM | POA: Diagnosis present

## 2018-10-18 DIAGNOSIS — Z794 Long term (current) use of insulin: Secondary | ICD-10-CM

## 2018-10-18 DIAGNOSIS — Z79890 Hormone replacement therapy (postmenopausal): Secondary | ICD-10-CM

## 2018-10-18 DIAGNOSIS — E876 Hypokalemia: Secondary | ICD-10-CM | POA: Diagnosis not present

## 2018-10-18 DIAGNOSIS — I10 Essential (primary) hypertension: Secondary | ICD-10-CM | POA: Diagnosis present

## 2018-10-18 DIAGNOSIS — Z7982 Long term (current) use of aspirin: Secondary | ICD-10-CM

## 2018-10-18 DIAGNOSIS — Z882 Allergy status to sulfonamides status: Secondary | ICD-10-CM

## 2018-10-18 DIAGNOSIS — K219 Gastro-esophageal reflux disease without esophagitis: Secondary | ICD-10-CM | POA: Diagnosis present

## 2018-10-18 DIAGNOSIS — L03115 Cellulitis of right lower limb: Secondary | ICD-10-CM | POA: Diagnosis present

## 2018-10-18 DIAGNOSIS — A419 Sepsis, unspecified organism: Secondary | ICD-10-CM | POA: Diagnosis present

## 2018-10-18 DIAGNOSIS — J189 Pneumonia, unspecified organism: Secondary | ICD-10-CM

## 2018-10-18 DIAGNOSIS — E785 Hyperlipidemia, unspecified: Secondary | ICD-10-CM | POA: Diagnosis present

## 2018-10-18 DIAGNOSIS — M71161 Other infective bursitis, right knee: Secondary | ICD-10-CM | POA: Diagnosis present

## 2018-10-18 DIAGNOSIS — W010XXA Fall on same level from slipping, tripping and stumbling without subsequent striking against object, initial encounter: Secondary | ICD-10-CM | POA: Diagnosis present

## 2018-10-18 DIAGNOSIS — Z96653 Presence of artificial knee joint, bilateral: Secondary | ICD-10-CM | POA: Diagnosis present

## 2018-10-18 DIAGNOSIS — Z888 Allergy status to other drugs, medicaments and biological substances status: Secondary | ICD-10-CM

## 2018-10-18 DIAGNOSIS — L02415 Cutaneous abscess of right lower limb: Secondary | ICD-10-CM | POA: Diagnosis present

## 2018-10-18 DIAGNOSIS — Z79899 Other long term (current) drug therapy: Secondary | ICD-10-CM | POA: Diagnosis not present

## 2018-10-18 DIAGNOSIS — Z1159 Encounter for screening for other viral diseases: Secondary | ICD-10-CM | POA: Diagnosis not present

## 2018-10-18 DIAGNOSIS — E1140 Type 2 diabetes mellitus with diabetic neuropathy, unspecified: Secondary | ICD-10-CM

## 2018-10-18 DIAGNOSIS — Z881 Allergy status to other antibiotic agents status: Secondary | ICD-10-CM

## 2018-10-18 DIAGNOSIS — Z880 Allergy status to penicillin: Secondary | ICD-10-CM

## 2018-10-18 DIAGNOSIS — Z8041 Family history of malignant neoplasm of ovary: Secondary | ICD-10-CM

## 2018-10-18 LAB — LACTIC ACID, PLASMA
Lactic Acid, Venous: 1.3 mmol/L (ref 0.5–1.9)
Lactic Acid, Venous: 1.3 mmol/L (ref 0.5–1.9)

## 2018-10-18 LAB — COMPREHENSIVE METABOLIC PANEL
ALT: 17 U/L (ref 0–44)
AST: 26 U/L (ref 15–41)
Albumin: 3.9 g/dL (ref 3.5–5.0)
Alkaline Phosphatase: 63 U/L (ref 38–126)
Anion gap: 10 (ref 5–15)
BUN: 14 mg/dL (ref 8–23)
CO2: 25 mmol/L (ref 22–32)
Calcium: 8.7 mg/dL — ABNORMAL LOW (ref 8.9–10.3)
Chloride: 101 mmol/L (ref 98–111)
Creatinine, Ser: 0.57 mg/dL — ABNORMAL LOW (ref 0.61–1.24)
GFR calc Af Amer: >60 mL/min (ref >60)
GFR calc non Af Amer: >60 mL/min (ref >60)
Glucose, Bld: 163 mg/dL — ABNORMAL HIGH (ref 70–99)
Potassium: 3.7 mmol/L (ref 3.5–5.1)
Sodium: 136 mmol/L (ref 135–145)
Total Bilirubin: 1.1 mg/dL (ref 0.3–1.2)
Total Protein: 6.9 g/dL (ref 6.5–8.1)

## 2018-10-18 LAB — RESPIRATORY PANEL BY PCR

## 2018-10-18 LAB — CBC WITH DIFFERENTIAL/PLATELET
Abs Immature Granulocytes: 0.05 10*3/uL (ref 0.00–0.07)
Basophils Absolute: 0 10*3/uL (ref 0.0–0.1)
Basophils Relative: 0 %
Eosinophils Absolute: 0.5 10*3/uL (ref 0.0–0.5)
Eosinophils Relative: 4 %
HCT: 40 % (ref 39.0–52.0)
Hemoglobin: 12.9 g/dL — ABNORMAL LOW (ref 13.0–17.0)
Immature Granulocytes: 1 %
Lymphocytes Relative: 9 %
Lymphs Abs: 0.9 10*3/uL (ref 0.7–4.0)
MCH: 27.9 pg (ref 26.0–34.0)
MCHC: 32.3 g/dL (ref 30.0–36.0)
MCV: 86.4 fL (ref 80.0–100.0)
Monocytes Absolute: 1 10*3/uL (ref 0.1–1.0)
Monocytes Relative: 9 %
Neutro Abs: 8.2 10*3/uL — ABNORMAL HIGH (ref 1.7–7.7)
Neutrophils Relative %: 77 %
Platelets: 162 10*3/uL (ref 150–400)
RBC: 4.63 MIL/uL (ref 4.22–5.81)
RDW: 14.3 % (ref 11.5–15.5)
WBC: 10.7 10*3/uL — ABNORMAL HIGH (ref 4.0–10.5)
nRBC: 0 % (ref 0.0–0.2)

## 2018-10-18 LAB — URINALYSIS, ROUTINE W REFLEX MICROSCOPIC
Bacteria, UA: NONE SEEN
Bilirubin Urine: NEGATIVE
Glucose, UA: >=500 mg/dL — AB
Hgb urine dipstick: NEGATIVE
Ketones, ur: NEGATIVE mg/dL
Leukocytes,Ua: NEGATIVE
Nitrite: NEGATIVE
Protein, ur: NEGATIVE mg/dL
Specific Gravity, Urine: 1.029 (ref 1.005–1.030)
pH: 7 (ref 5.0–8.0)

## 2018-10-18 LAB — SEDIMENTATION RATE: Sed Rate: 45 mm/hr — ABNORMAL HIGH (ref 0–20)

## 2018-10-18 LAB — GLUCOSE, CAPILLARY
Glucose-Capillary: 126 mg/dL — ABNORMAL HIGH (ref 70–99)
Glucose-Capillary: 140 mg/dL — ABNORMAL HIGH (ref 70–99)

## 2018-10-18 LAB — SARS CORONAVIRUS 2 BY RT PCR (HOSPITAL ORDER, PERFORMED IN ~~LOC~~ HOSPITAL LAB): SARS Coronavirus 2: NEGATIVE

## 2018-10-18 MED ORDER — ENOXAPARIN SODIUM 40 MG/0.4ML ~~LOC~~ SOLN
40.0000 mg | SUBCUTANEOUS | Status: DC
  Administered 2018-10-18: 40 mg via SUBCUTANEOUS
  Filled 2018-10-18: qty 0.4

## 2018-10-18 MED ORDER — KETOROLAC TROMETHAMINE 30 MG/ML IJ SOLN
30.0000 mg | Freq: Four times a day (QID) | INTRAMUSCULAR | Status: DC | PRN
  Administered 2018-10-18: 30 mg via INTRAVENOUS
  Filled 2018-10-18: qty 1

## 2018-10-18 MED ORDER — INSULIN ASPART 100 UNIT/ML ~~LOC~~ SOLN
0.0000 [IU] | Freq: Every day | SUBCUTANEOUS | Status: DC
  Administered 2018-10-20: 2 [IU] via SUBCUTANEOUS
  Filled 2018-10-18: qty 1

## 2018-10-18 MED ORDER — HYDROCHLOROTHIAZIDE 25 MG PO TABS
12.5000 mg | ORAL_TABLET | Freq: Every day | ORAL | Status: DC
  Administered 2018-10-18 – 2018-10-21 (×4): 12.5 mg via ORAL
  Filled 2018-10-18 (×4): qty 1

## 2018-10-18 MED ORDER — IOHEXOL 350 MG/ML SOLN
75.0000 mL | Freq: Once | INTRAVENOUS | Status: AC | PRN
  Administered 2018-10-18: 75 mL via INTRAVENOUS
  Filled 2018-10-18: qty 75

## 2018-10-18 MED ORDER — DULOXETINE HCL 30 MG PO CPEP
30.0000 mg | ORAL_CAPSULE | Freq: Every day | ORAL | Status: DC
  Administered 2018-10-18 – 2018-10-21 (×4): 30 mg via ORAL
  Filled 2018-10-18 (×4): qty 1

## 2018-10-18 MED ORDER — SODIUM CHLORIDE 0.9 % IV SOLN
INTRAVENOUS | Status: DC
  Administered 2018-10-18 – 2018-10-19 (×2): via INTRAVENOUS

## 2018-10-18 MED ORDER — CYCLOBENZAPRINE HCL 10 MG PO TABS
5.0000 mg | ORAL_TABLET | Freq: Three times a day (TID) | ORAL | Status: DC | PRN
  Administered 2018-10-18 – 2018-10-20 (×5): 5 mg via ORAL
  Filled 2018-10-18 (×5): qty 1

## 2018-10-18 MED ORDER — ROSUVASTATIN CALCIUM 10 MG PO TABS
20.0000 mg | ORAL_TABLET | Freq: Every day | ORAL | Status: DC
  Administered 2018-10-18 – 2018-10-21 (×4): 20 mg via ORAL
  Filled 2018-10-18 (×4): qty 2

## 2018-10-18 MED ORDER — INSULIN ASPART 100 UNIT/ML ~~LOC~~ SOLN
0.0000 [IU] | Freq: Three times a day (TID) | SUBCUTANEOUS | Status: DC
  Administered 2018-10-18 – 2018-10-19 (×2): 3 [IU] via SUBCUTANEOUS
  Administered 2018-10-19: 4 [IU] via SUBCUTANEOUS
  Administered 2018-10-20: 17:00:00 15 [IU] via SUBCUTANEOUS
  Administered 2018-10-20: 7 [IU] via SUBCUTANEOUS
  Filled 2018-10-18 (×5): qty 1

## 2018-10-18 MED ORDER — ACETAMINOPHEN 650 MG RE SUPP: 650.0000 mg | Freq: Four times a day (QID) | RECTAL | Status: DC | PRN

## 2018-10-18 MED ORDER — GABAPENTIN 300 MG PO CAPS
900.0000 mg | ORAL_CAPSULE | Freq: Three times a day (TID) | ORAL | Status: DC
  Administered 2018-10-18 – 2018-10-21 (×9): 900 mg via ORAL
  Filled 2018-10-18 (×9): qty 3

## 2018-10-18 MED ORDER — ADULT MULTIVITAMIN W/MINERALS CH
1.0000 | ORAL_TABLET | Freq: Every day | ORAL | Status: DC
  Administered 2018-10-18 – 2018-10-21 (×4): 1 via ORAL
  Filled 2018-10-18 (×4): qty 1

## 2018-10-18 MED ORDER — MORPHINE SULFATE (PF) 2 MG/ML IV SOLN: 0.5000 mg | INTRAVENOUS | Status: DC | PRN

## 2018-10-18 MED ORDER — ONDANSETRON HCL 4 MG PO TABS: 4.0000 mg | ORAL_TABLET | Freq: Four times a day (QID) | ORAL | Status: DC | PRN

## 2018-10-18 MED ORDER — MORPHINE SULFATE (PF) 4 MG/ML IV SOLN
6.0000 mg | Freq: Once | INTRAVENOUS | Status: AC
  Administered 2018-10-18: 6 mg via INTRAVENOUS
  Filled 2018-10-18: qty 2

## 2018-10-18 MED ORDER — SODIUM CHLORIDE 0.9 % IV BOLUS (SEPSIS)
1000.0000 mL | Freq: Once | INTRAVENOUS | Status: AC
  Administered 2018-10-18: 1000 mL via INTRAVENOUS

## 2018-10-18 MED ORDER — ACETAMINOPHEN 500 MG PO TABS
500.0000 mg | ORAL_TABLET | Freq: Four times a day (QID) | ORAL | Status: AC
  Administered 2018-10-18: 500 mg via ORAL
  Filled 2018-10-18: qty 1

## 2018-10-18 MED ORDER — ONDANSETRON HCL 4 MG/2ML IJ SOLN
4.0000 mg | Freq: Four times a day (QID) | INTRAMUSCULAR | Status: DC | PRN
  Filled 2018-10-18: qty 2

## 2018-10-18 MED ORDER — AMLODIPINE BESYLATE 5 MG PO TABS
5.0000 mg | ORAL_TABLET | Freq: Every day | ORAL | Status: DC
  Administered 2018-10-18 – 2018-10-21 (×4): 5 mg via ORAL
  Filled 2018-10-18 (×4): qty 1

## 2018-10-18 MED ORDER — PANTOPRAZOLE SODIUM 40 MG PO TBEC
40.0000 mg | DELAYED_RELEASE_TABLET | Freq: Every day | ORAL | Status: DC
  Administered 2018-10-18 – 2018-10-21 (×4): 40 mg via ORAL
  Filled 2018-10-18 (×5): qty 1

## 2018-10-18 MED ORDER — SODIUM CHLORIDE 0.9 % IV SOLN
2.0000 g | Freq: Three times a day (TID) | INTRAVENOUS | Status: DC
  Administered 2018-10-18 – 2018-10-21 (×9): 2 g via INTRAVENOUS
  Filled 2018-10-18 (×11): qty 2

## 2018-10-18 MED ORDER — CARVEDILOL 3.125 MG PO TABS
6.2500 mg | ORAL_TABLET | Freq: Two times a day (BID) | ORAL | Status: DC
  Administered 2018-10-18 – 2018-10-21 (×6): 6.25 mg via ORAL
  Filled 2018-10-18 (×7): qty 2

## 2018-10-18 MED ORDER — INSULIN GLARGINE 100 UNIT/ML ~~LOC~~ SOLN
80.0000 [IU] | Freq: Every day | SUBCUTANEOUS | Status: DC
  Administered 2018-10-18 – 2018-10-20 (×3): 80 [IU] via SUBCUTANEOUS
  Filled 2018-10-18 (×4): qty 0.8

## 2018-10-18 MED ORDER — TRAMADOL HCL 50 MG PO TABS
50.0000 mg | ORAL_TABLET | Freq: Four times a day (QID) | ORAL | Status: DC
  Administered 2018-10-18 – 2018-10-21 (×7): 50 mg via ORAL
  Filled 2018-10-18 (×7): qty 1

## 2018-10-18 MED ORDER — ENOXAPARIN SODIUM 40 MG/0.4ML ~~LOC~~ SOLN: 40.0000 mg | SUBCUTANEOUS | Status: DC

## 2018-10-18 MED ORDER — SODIUM CHLORIDE 0.9 % IV SOLN
2.0000 g | Freq: Once | INTRAVENOUS | Status: AC
  Administered 2018-10-18: 2 g via INTRAVENOUS
  Filled 2018-10-18 (×2): qty 2

## 2018-10-18 MED ORDER — VANCOMYCIN HCL 10 G IV SOLR
2000.0000 mg | Freq: Two times a day (BID) | INTRAVENOUS | Status: DC
  Administered 2018-10-18 – 2018-10-20 (×4): 2000 mg via INTRAVENOUS
  Filled 2018-10-18 (×5): qty 2000

## 2018-10-18 MED ORDER — MELATONIN 5 MG PO TABS
10.0000 mg | ORAL_TABLET | Freq: Every evening | ORAL | Status: DC | PRN
  Filled 2018-10-18: qty 2

## 2018-10-18 MED ORDER — HYDROCODONE-ACETAMINOPHEN 7.5-325 MG PO TABS
1.0000 | ORAL_TABLET | ORAL | Status: DC | PRN
  Administered 2018-10-18 – 2018-10-20 (×2): 2 via ORAL
  Filled 2018-10-18 (×2): qty 2

## 2018-10-18 MED ORDER — LOSARTAN POTASSIUM 50 MG PO TABS
100.0000 mg | ORAL_TABLET | Freq: Every day | ORAL | Status: DC
  Administered 2018-10-18 – 2018-10-21 (×4): 100 mg via ORAL
  Filled 2018-10-18 (×4): qty 2

## 2018-10-18 MED ORDER — VANCOMYCIN HCL IN DEXTROSE 1-5 GM/200ML-% IV SOLN
1000.0000 mg | Freq: Once | INTRAVENOUS | Status: AC
  Administered 2018-10-18: 1000 mg via INTRAVENOUS
  Filled 2018-10-18 (×2): qty 200

## 2018-10-18 MED ORDER — METRONIDAZOLE IN NACL 5-0.79 MG/ML-% IV SOLN
500.0000 mg | Freq: Once | INTRAVENOUS | Status: AC
  Administered 2018-10-18: 500 mg via INTRAVENOUS
  Filled 2018-10-18 (×2): qty 100

## 2018-10-18 MED ORDER — ASPIRIN EC 81 MG PO TBEC
81.0000 mg | DELAYED_RELEASE_TABLET | Freq: Every day | ORAL | Status: DC
  Administered 2018-10-18 – 2018-10-21 (×4): 81 mg via ORAL
  Filled 2018-10-18 (×4): qty 1

## 2018-10-18 MED ORDER — ACETAMINOPHEN 500 MG PO TABS
1000.0000 mg | ORAL_TABLET | Freq: Once | ORAL | Status: AC
  Administered 2018-10-18: 1000 mg via ORAL
  Filled 2018-10-18: qty 2

## 2018-10-18 MED ORDER — ACETAMINOPHEN 325 MG PO TABS
325.0000 mg | ORAL_TABLET | Freq: Four times a day (QID) | ORAL | Status: DC | PRN
  Administered 2018-10-19: 325 mg via ORAL
  Filled 2018-10-18: qty 2

## 2018-10-18 MED ORDER — ACETAMINOPHEN 325 MG PO TABS
650.0000 mg | ORAL_TABLET | Freq: Four times a day (QID) | ORAL | Status: DC | PRN
  Filled 2018-10-18 (×2): qty 2

## 2018-10-18 MED ORDER — HYDROCODONE-ACETAMINOPHEN 5-325 MG PO TABS
1.0000 | ORAL_TABLET | ORAL | Status: DC | PRN
  Administered 2018-10-18 – 2018-10-20 (×3): 2 via ORAL
  Filled 2018-10-18 (×3): qty 2

## 2018-10-18 MED ORDER — METRONIDAZOLE IN NACL 5-0.79 MG/ML-% IV SOLN
500.0000 mg | Freq: Three times a day (TID) | INTRAVENOUS | Status: DC
  Administered 2018-10-18 – 2018-10-20 (×5): 500 mg via INTRAVENOUS
  Filled 2018-10-18 (×9): qty 100

## 2018-10-18 MED ORDER — ONDANSETRON HCL 4 MG/2ML IJ SOLN
4.0000 mg | Freq: Once | INTRAMUSCULAR | Status: AC
  Administered 2018-10-18: 4 mg via INTRAVENOUS
  Filled 2018-10-18: qty 2

## 2018-10-18 NOTE — ED Notes (Signed)
ED TO INPATIENT HANDOFF REPORT  ED Nurse Name and Phone #: Timira Bieda [phone]  S Name/Age/Gender Wende Mott 63 y.o. male Room/Bed: ED33A/ED33A  Code Status   Code Status: Full Code  Home/SNF/Other Home Patient oriented to: self, place, time and situation Is this baseline? Yes   Triage Complete: Triage complete  Chief Complaint fall  Triage Note Pt in via [facility] from home, pt reports mechanical fall on Saturday, injuring right knee.  Pt reports falling again today, reports getting up from couch, knee giving out, falling again.  Pt complains of right knee pain and left hip pain.  Pt febrile and hypoxic upon arrival, denies any other complaints.  EDP notified.   Allergies Allergies  Allergen Reactions  . Penicillins Anaphylaxis, Rash and Other (See Comments)  . Succinylcholine Chloride Anaphylaxis and Rash  . Keflex [Cephalexin] Rash  . Sulfa Antibiotics Rash and Other (See Comments)    Level of Care/Admitting Diagnosis ED Disposition    ED Disposition Condition Comment   Admit  Hospital Area: [facility] [[id number]]  Level of Care: Med-Surg [16]  Covid Evaluation: Confirmed COVID Negative  Diagnosis: Infection of right prepatellar bursa [[id number]]  Admitting Physician: Kennedy Bucker [[id number]]  Attending Physician: Kennedy Bucker [phone]  Estimated length of stay: past midnight tomorrow  Certification:: I certify this patient will need inpatient services for at least 2 midnights  PT Class (Do Not Modify): Inpatient [101]  PT Acc Code (Do Not Modify): Private [1]       B Medical/Surgery History Past Medical History:  Diagnosis Date  . Diabetes (HCC)   . GERD (gastroesophageal reflux disease)   . Hyperlipidemia   . Hypertension   . Sleep apnea    Past Surgical History:  Procedure Laterality Date  . CARPAL TUNNEL RELEASE Right 12/28/2016   Procedure: RIGHT ULNAR AND MEDIAN NEUROPLASTY AT WRIST;  Surgeon: Mack Hook, MD;  Location: [facility];  Service: Orthopedics;  Laterality: Right;  . REPLACEMENT TOTAL KNEE BILATERAL  2014   left 2012 rt 2014     A IV Location/Drains/Wounds Patient Lines/Drains/Airways Status   Active Line/Drains/Airways    Name:   Placement date:   Placement time:   Site:   Days:   Peripheral IV 10/18/18 Right Forearm   10/18/18    0952    Forearm   less than 1   Peripheral IV 10/18/18 Right Wrist   10/18/18    1000    Wrist   less than 1   Incision (Closed) 12/28/16 Arm Right   12/28/16    1231     659          Intake/Output Last 24 hours  Intake/Output Summary (Last 24 hours) at 10/18/2018 1237 Last data filed at 10/18/2018 1150 Gross per 24 hour  Intake 1295.81 ml  Output -  Net 1295.81 ml    Labs/Imaging Results for orders placed or performed during the hospital encounter of 10/18/18 (from the past 48 hour(s))  Lactic acid, plasma     Status: None   Collection Time: 10/18/18  9:37 AM  Result Value Ref Range   Lactic Acid, Venous 1.3 0.5 - 1.9 mmol/L    Comment: Performed at [facility], [address] [place], [place] [address]  Comprehensive metabolic panel     Status: Abnormal   Collection Time: 10/18/18  9:37 AM  Result Value Ref Range   Sodium 136 135 - 145 mmol/L   Potassium 3.7 3.5 - 5.1 mmol/L  Chloride 101 98 - 111 mmol/L   CO2 25 22 - 32 mmol/L   Glucose, Bld 163 (H) 70 - 99 mg/dL   BUN 14 8 - 23 mg/dL   Creatinine, Ser 1.32 (L) 0.61 - 1.24 mg/dL   Calcium 8.7 (L) 8.9 - 10.3 mg/dL   Total Protein 6.9 6.5 - 8.1 g/dL   Albumin 3.9 3.5 - 5.0 g/dL   AST 26 15 - 41 U/L   ALT 17 0 - 44 U/L   Alkaline Phosphatase 63 38 - 126 U/L   Total Bilirubin 1.1 0.3 - 1.2 mg/dL   GFR calc non Af Amer >60 >60 mL/min   GFR calc Af Amer >60 >60 mL/min   Anion gap 10 5 - 15    Comment: Performed at [facility], [address] [place], [place] [address]  CBC WITH DIFFERENTIAL     Status: Abnormal   Collection Time: 10/18/18  9:37 AM  Result Value  Ref Range   WBC 10.7 (H) 4.0 - 10.5 K/uL   RBC 4.63 4.22 - 5.81 MIL/uL   Hemoglobin 12.9 (L) 13.0 - 17.0 g/dL   HCT 27.2 53.6 - 64.4 %   MCV 86.4 80.0 - 100.0 fL   MCH 27.9 26.0 - 34.0 pg   MCHC 32.3 30.0 - 36.0 g/dL   RDW 03.4 74.2 - 59.5 %   Platelets 162 150 - 400 K/uL   nRBC 0.0 0.0 - 0.2 %   Neutrophils Relative % 77 %   Neutro Abs 8.2 (H) 1.7 - 7.7 K/uL   Lymphocytes Relative 9 %   Lymphs Abs 0.9 0.7 - 4.0 K/uL   Monocytes Relative 9 %   Monocytes Absolute 1.0 0.1 - 1.0 K/uL   Eosinophils Relative 4 %   Eosinophils Absolute 0.5 0.0 - 0.5 K/uL   Basophils Relative 0 %   Basophils Absolute 0.0 0.0 - 0.1 K/uL   Immature Granulocytes 1 %   Abs Immature Granulocytes 0.05 0.00 - 0.07 K/uL    Comment: Performed at [facility], [address] [place], [place] [address]  SARS Coronavirus 2 (CEPHEID- Performed in [facility] hospital lab), Hosp Order     Status: None   Collection Time: 10/18/18  9:37 AM  Result Value Ref Range   SARS Coronavirus 2 NEGATIVE NEGATIVE    Comment: (NOTE) If result is NEGATIVE SARS-CoV-2 target nucleic acids are NOT DETECTED. The SARS-CoV-2 RNA is generally detectable in upper and lower  respiratory specimens during the acute phase of infection. The lowest  concentration of SARS-CoV-2 viral copies this assay can detect is 250  copies / mL. A negative result does not preclude SARS-CoV-2 infection  and should not be used as the sole basis for treatment or other  patient management decisions.  A negative result may occur with  improper specimen collection / handling, submission of specimen other  than nasopharyngeal swab, presence of viral mutation(s) within the  areas targeted by this assay, and inadequate number of viral copies  (<250 copies / mL). A negative result must be combined with clinical  observations, patient history, and epidemiological information. If result is POSITIVE SARS-CoV-2 target nucleic acids are DETECTED. The  SARS-CoV-2 RNA is generally detectable in upper and lower  respiratory specimens dur ing the acute phase of infection.  Positive  results are indicative of active infection with SARS-CoV-2.  Clinical  correlation with patient history and other diagnostic information is  necessary to determine patient infection status.  Positive  results do  not rule out bacterial infection or co-infection with other viruses. If result is PRESUMPTIVE POSTIVE SARS-CoV-2 nucleic acids MAY BE PRESENT.   A presumptive positive result was obtained on the submitted specimen  and confirmed on repeat testing.  While 2019 novel coronavirus  (SARS-CoV-2) nucleic acids may be present in the submitted sample  additional confirmatory testing may be necessary for epidemiological  and / or clinical management purposes  to differentiate between  SARS-CoV-2 and other Sarbecovirus currently known to infect humans.  If clinically indicated additional testing with an alternate test  methodology [phone]) is advised. The SARS-CoV-2 RNA is generally  detectable in upper and lower respiratory sp ecimens during the acute  phase of infection. The expected result is Negative. Fact Sheet for Patients:  BoilerBrush.com.cy Fact Sheet for Healthcare Providers: https://pope.com/ This test is not yet approved or cleared by the [place] FDA and has been authorized for detection and/or diagnosis of SARS-CoV-2 by FDA under an Emergency Use Authorization (EUA).  This EUA will remain in effect (meaning this test can be used) for the duration of the COVID-19 declaration under Section 564(b)(1) of the Act, 21 U.S.C. section 360bbb-3(b)(1), unless the authorization is terminated or revoked sooner. Performed at [facility], [address] [place], [place] [address]    Ct Angio Chest Pe W And/or Wo Contrast  Result Date: 10/18/2018 CLINICAL DATA:  Shortness of breath, hypoxia  EXAM: CT ANGIOGRAPHY CHEST WITH CONTRAST TECHNIQUE: Multidetector CT imaging of the chest was performed using the standard protocol during bolus administration of intravenous contrast. Multiplanar CT image reconstructions and MIPs were obtained to evaluate the vascular anatomy. CONTRAST:  65mL OMNIPAQUE IOHEXOL 350 MG/ML SOLN COMPARISON:  CT chest 11/29/2013 FINDINGS: Cardiovascular: Satisfactory opacification of the pulmonary arteries to the lobar level. No evidence of pulmonary embolism in the main pulmonary arteries or the lobar pulmonary arteries. Segmental and subsegmental pulmonary arteries are suboptimally evaluated. Normal heart size. No pericardial effusion. Thoracic aortic atherosclerosis. Coronary artery atherosclerosis. Mediastinum/Nodes: No enlarged mediastinal, hilar, or axillary lymph nodes. Thyroid gland, trachea, and esophagus demonstrate no significant findings. Lungs/Pleura: Centrilobular emphysema. No focal consolidation, pleural effusion or pneumothorax. Patchy areas of ground-glass in the bilateral upper lobes, lingula and right lower lobe which may be secondary to an infectious or inflammatory etiology versus air trapping. Upper Abdomen: No acute abnormality. Musculoskeletal: No acute osseous abnormality. No aggressive osseous lesion. Review of the MIP images confirms the above findings. IMPRESSION: 1. Satisfactory opacification of the pulmonary arteries to the lobar level. No evidence of pulmonary embolism in the main pulmonary arteries or the lobar pulmonary arteries. Segmental and subsegmental pulmonary arteries are suboptimally evaluated. 2. Patchy areas of ground-glass in the bilateral upper lobes, lingula and right lower lobe which may be secondary to an infectious or inflammatory etiology versus air trapping. 3. Centrilobular emphysema. 4. Aortic Atherosclerosis (ICD10-I70.0) and Emphysema (ICD10-J43.9). Electronically Signed   By: Elige Ko   On: 10/18/2018 11:53   Dg Chest Port 1  View  Result Date: 10/18/2018 CLINICAL DATA:  63 year old male status post fall 4 days ago. Shortness of breath and fever. EXAM: PORTABLE CHEST 1 VIEW COMPARISON:  Chest radiographs 05/31/2017 and earlier. FINDINGS: Portable AP upright view at 0939 hours. Stable lung volumes with mild elevation of the right hemidiaphragm, normal variant. Normal cardiac size and mediastinal contours. Visualized tracheal air column is within normal limits. Allowing for portable technique the lungs are clear. No pneumothorax or pleural effusion. Paucity of bowel gas in the  upper abdomen. No acute osseous abnormality identified. IMPRESSION: Negative portable chest. Electronically Signed   By: Odessa FlemingH  Hall M.D.   On: 10/18/2018 10:08   Dg Knee Right Port  Result Date: 10/18/2018 CLINICAL DATA:  63 year old male status post fall 4 days ago and again today. Pain. EXAM: PORTABLE RIGHT KNEE - 1-2 VIEW COMPARISON:  Right knee series 06/23/2012. FINDINGS: Chronic right total knee arthroplasty. Hardware appears stable since 2014 and intact. 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 Chronic exostosis from the medial femoral metadiaphysis is stable. No joint effusion is evident on cross-table lateral view. No acute osseous abnormality identified. IMPRESSION: No acute fracture or dislocation identified about the right knee, and hardware intact. Electronically Signed   By: Odessa FlemingH  Hall M.D.   On: 10/18/2018 10:10    Pending Labs Unresulted Labs (From admission, onward)    Start     Ordered   10/25/18 0500  Creatinine, serum  (enoxaparin (LOVENOX)    CrCl >/= 30 ml/min)  Weekly,   STAT    Comments:  while on enoxaparin therapy    10/18/18 1215   10/18/18 1214  Sedimentation rate  Once,   STAT     10/18/18 1215   10/18/18 1213  HIV antibody (Routine Testing)  Once,   STAT     10/18/18 1215   10/18/18 0932  Lactic acid, plasma  Now then every 2 hours,   STAT     10/18/18 0933   10/18/18 0932  Blood Culture (routine x 2)  BLOOD  CULTURE X 2,   STAT    Question:  Patient immune status  Answer:  Normal   10/18/18 0933   10/18/18 0932  Urinalysis, Routine w reflex microscopic  ONCE - STAT,   STAT     10/18/18 0933   10/18/18 0932  Urine culture  ONCE - STAT,   STAT    Question:  Patient immune status  Answer:  Normal   10/18/18 0933          Vitals/Pain Today's Vitals   10/18/18 0927 10/18/18 0928 10/18/18 0929 10/18/18 1045  BP: (!) 154/71     Pulse: 86     Resp: 16     Temp: (!) 101.4 F (38.6 C)     TempSrc: Oral     SpO2: (!) 84%     Weight:   (!) 167.8 kg   Height:   6\' 9"  (2.057 m)   PainSc:  7   2     Isolation Precautions Droplet and Contact precautions  Medications Medications  enoxaparin (LOVENOX) injection 40 mg (has no administration in time range)  0.9 %  sodium chloride infusion (has no administration in time range)  acetaminophen (TYLENOL) tablet 325-650 mg (has no administration in time range)  HYDROcodone-acetaminophen (NORCO/VICODIN) 5-325 MG per tablet 1-2 tablet (has no administration in time range)  HYDROcodone-acetaminophen (NORCO) 7.5-325 MG per tablet 1-2 tablet (has no administration in time range)  morphine 2 MG/ML injection 0.5-1 mg (has no administration in time range)  acetaminophen (TYLENOL) tablet 500 mg (has no administration in time range)  traMADol (ULTRAM) tablet 50 mg (has no administration in time range)  aztreonam (AZACTAM) 2 g in sodium chloride 0.9 % 100 mL IVPB ( Intravenous Stopped 10/18/18 1039)  metroNIDAZOLE (FLAGYL) IVPB 500 mg (500 mg Intravenous New Bag/Given 10/18/18 1044)  vancomycin (VANCOCIN) IVPB 1000 mg/200 mL premix ( Intravenous Stopped 10/18/18 1128)  sodium chloride 0.9 % bolus 1,000 mL (0 mLs Intravenous Stopped 10/18/18  1120)  acetaminophen (TYLENOL) tablet 1,000 mg (1,000 mg Oral Given 10/18/18 1010)  morphine 4 MG/ML injection 6 mg (6 mg Intravenous Given 10/18/18 1009)  ondansetron (ZOFRAN) injection 4 mg (4 mg Intravenous Given 10/18/18 1009)   iohexol (OMNIPAQUE) 350 MG/ML injection 75 mL (75 mLs Intravenous Contrast Given 10/18/18 1059)    Mobility walks with device Low fall risk   Focused Assessments Pulmonary Assessment Handoff:  Lung sounds: Bilateral Breath Sounds: Clear L Breath Sounds: Diminished R Breath Sounds: Diminished O2 Device: Nasal Cannula O2 Flow Rate (L/min): 4 L/min      R Recommendations: See Admitting Provider Note  Report given to:   Additional Notes:

## 2018-10-18 NOTE — ED Triage Notes (Signed)
Pt in via [facility] from home, pt reports mechanical fall on Saturday, injuring right knee.  Pt reports falling again today, reports getting up from couch, knee giving out, falling again.  Pt complains of right knee pain and left hip pain.  Pt febrile and hypoxic upon arrival, denies any other complaints.  EDP notified.

## 2018-10-18 NOTE — ED Notes (Signed)
Pt placed on 4L O2 to maintain SPO2 > 92%.

## 2018-10-18 NOTE — Plan of Care (Signed)

## 2018-10-18 NOTE — ED Provider Notes (Signed)
[facility] Emergency Department Provider Note  ____________________________________________   First MD Initiated Contact with Patient 10/18/18 [phone]     (approximate)  I have reviewed the triage vital signs and the nursing notes.   HISTORY  Chief Complaint Fall    HPI Nathan Dean is a 63 y.o. male with extensive past medical history as below including sleep apnea, diabetes, morbid obesity, here with multiple complaints.  Patient's primary complaint is right knee pain.  States that last Saturday, approximately 13 days ago, he had a mechanical fall.  He slipped on a small raise in a floor at his daughter's house.  He fell onto his right knee and left hip.  He sustained superficial abrasion to his right knee.  Since then, has had increasing right knee pain, as well as redness and some drainage.  He has had some generalized weakness, though is unsure whether this is from weakness or more severe pain with any kind of movement.  Has had to lay on the couch more than usual.  He tried to get up out of bed today to walk his puppy, when he fell due to weakness.  He again landed on his right knee as well as his left hip.  He denies any known fevers or chills.  He does have a slight decrease in appetite.  Denies any cough or shortness of breath.  Patient called EMS today and was found to be hypoxic on arrival, as well as febrile.  He did not recall being febrile.  Denies any known sick contacts.  No pain in the back of his legs.  Of note, he has had multiple knee surgeries, most recently with Dr. Marry Guan on the right knee.        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 Authorizing Provider  amLODipine (NORVASC) 10 MG tablet Take 0.5 tablets (5 mg total) by mouth daily. 11/15/17  Yes Sagardia, Ines Bloomer, MD  aspirin 81 MG tablet Take 1 tablet (81 mg total) by mouth daily. 08/11/16  Yes Karamalegos, Devonne Doughty, DO  Blood Glucose Monitoring Suppl (ONE TOUCH ULTRA SYSTEM KIT) w/Device KIT 1 kit by Does not apply route once. Dx. E11.9 07/28/15  Yes Arlis Porta., MD  carvedilol (COREG) 6.25 MG tablet  TAKE 1 TABLET BY MOUTH TWICE A DAY WITH FOOD Patient taking differently: Take 6.25 mg by mouth 2 (two) times a day.  03/14/18  Yes Sagardia, Ines Bloomer, MD  DULoxetine (CYMBALTA) 30 MG capsule Take 30 mg by mouth daily. 07/24/18  Yes [provider]  gabapentin (NEURONTIN) 300 MG capsule TAKE 1-2 CAPSULES BY MOUTH THREE TIMES A DAY Patient taking differently: Take 900 mg by mouth 3 (three) times daily.  03/24/18  Yes Sagardia, Ines Bloomer, MD  glucose blood test strip 1 each by Other route 4 (four) times daily. Use with One touch meter to check blood sugar. Dx E11.9  09/13/16  Yes Mikey College, NP  hydrochlorothiazide (HYDRODIURIL) 12.5 MG tablet TAKE 1 TABLET BY MOUTH EVERY DAY Patient taking differently: Take 12.5 mg by mouth daily.  06/15/18  Yes Sagardia, Ines Bloomer, MD  Insulin Degludec 200 UNIT/ML SOPN Inject 80 Units into the skin at bedtime.    Yes [provider]  Insulin Pen Needle (BD PEN NEEDLE NANO U/F) 32G X 4 MM MISC Inject 1 each as directed 3 (three) times daily. 04/18/18  Yes Stallings, Zoe A, MD  losartan (COZAAR) 100 MG tablet TAKE 1 TABLET BY MOUTH EVERY DAY Patient taking differently: Take 100 mg by mouth daily.  08/16/18  Yes Sagardia, Ines Bloomer, MD  Melatonin 10 MG TABS Take 10 mg by mouth at bedtime as needed (sleep).   Yes [provider]  Multiple Vitamin (MULTIVITAMIN WITH MINERALS) TABS tablet Take 1 tablet by mouth daily.   Yes [provider]  omeprazole (PRILOSEC) 20 MG capsule Take 20 mg by mouth daily.    Yes [provider]  ONE TOUCH LANCETS MISC 1 each by Does not apply route 4 (four) times daily. Use with one touch meter to check blood sugar. Dx: E11.9 09/16/16  Yes Mikey College, NP  rosuvastatin (CRESTOR) 20 MG tablet Take 20 mg by mouth daily.   Yes [provider]  Semaglutide (OZEMPIC, 1 MG/DOSE, [place]) Inject 1 mg into the skin once a week.    Yes [provider]  SYNJARDY 09-998 MG TABS Take 1 tablet by mouth 2 (two) times daily with a meal. 07/06/18  Yes [provider]    Allergies Penicillins; Succinylcholine chloride; Keflex [cephalexin]; and Sulfa antibiotics  Family History  Problem Relation Age of Onset   Ovarian cancer Mother    Kidney failure Father    Cancer Brother     Social History Social History   Tobacco Use   Smoking status: Former Smoker    Packs/day: 2.00    Years: 20.00    Pack years: 40.00    Types: Cigarettes    Last attempt to quit: 06/28/1996    Years since quitting: 22.3   Smokeless tobacco: Never  Used  Substance Use Topics   Alcohol use: Yes    Alcohol/week: 0.0 standard drinks    Comment: socially   Drug use: No    Review of Systems Review of Systems  Constitutional: Positive for fatigue. Negative for chills and fever.  HENT: Negative for congestion and rhinorrhea.   Eyes: Negative for visual disturbance.  Respiratory: Negative for cough and shortness of breath.   Gastrointestinal: Negative for abdominal pain, diarrhea, nausea and vomiting.  Genitourinary: Negative for dysuria and flank pain.  Musculoskeletal: Positive for arthralgias.  Skin: Positive for rash and wound.  Neurological: Positive for weakness. Negative for light-headedness.  All other systems reviewed and are negative.  ____________________________________________  PHYSICAL EXAM:  VITAL SIGNS: ED Triage Vitals  Enc Vitals Group     BP 10/18/18 0927 (!) 154/71     Pulse Rate 10/18/18 0927 86     Resp 10/18/18 0927 16     Temp 10/18/18 0927 (!) 101.4 F (38.6 C)     Temp Source 10/18/18 0927 Oral     SpO2 10/18/18 0927 (!) 84 %     Weight 10/18/18 0929 (!) 370 lb (167.8 kg)     Height 10/18/18 0929 6' 9"  (2.057 m)     Head Circumference --      Peak Flow --      Pain Score 10/18/18 0928 7     Pain Loc --      Pain Edu? --      Excl. in [place]? --     Physical Exam  Constitutional: He is oriented to person, place, and time and well-developed, well-nourished, and in no distress. No distress.  Morbidly obese  HENT:  Head: Normocephalic and atraumatic.  Mouth/Throat: No oropharyngeal exudate.  Eyes: Pupils are equal, round, and reactive to light.  Neck: Neck supple.  No midline or paraspinal tenderness  Cardiovascular: Normal rate, regular rhythm, normal heart sounds and intact distal pulses. Exam reveals no friction rub.  No murmur heard. Pulmonary/Chest: Effort normal. No respiratory distress. He has no wheezes. He has rales.  Bilateral rhonchi with mild tachypnea  Abdominal: Soft. He  exhibits no distension. There is no abdominal tenderness.  No overt tenderness.  Superficial bruising to bilateral flanks, consistent with this history of trauma.  Musculoskeletal:        General: No edema.     Comments: See below. Mild TTP over L hip w/o bruising or deformity.  Neurological: He is alert and oriented to person, place, and time.  Skin: Skin is warm. Rash noted.  See below  Psychiatric: Affect normal.  Nursing note and vitals reviewed.   LOWER EXTREMITY EXAM: RIGHT  INSPECTION & PALPATION: TTP over R anterior knee with significant edema overlying knee with erythema, induration. Superficial abrasion noted overlying patella, with fibrinous/purulent base. No expressible purulence. TTP extends proximally to distal thigh. Able to passive range knee with mild anterior TTP.  SENSORY: sensation is intact to light touch in:  Superficial peroneal nerve distribution (over dorsum of foot) Deep peroneal nerve distribution (over first dorsal web space) Sural nerve distribution (over lateral aspect 5th metatarsal) Saphenous nerve distribution (over medial instep)  MOTOR:  + Motor EHL (great toe dorsiflexion) + FHL (great toe plantar flexion)  + TA (ankle dorsiflexion)  + GSC (ankle plantar flexion)  VASCULAR: 2+ dorsalis pedis and posterior tibialis pulses Capillary refill < 2 sec, toes warm and well-perfused  COMPARTMENTS: Soft, warm, well-perfused No pain with passive extension No parethesias       ____________________________________________   LABS (all labs ordered are listed, but only abnormal results are displayed)  Labs Reviewed  COMPREHENSIVE METABOLIC PANEL - Abnormal; Notable for the following components:      Result Value   Glucose, Bld 163 (*)    Creatinine, Ser 0.57 (*)    Calcium 8.7 (*)    All other components within normal limits  CBC WITH DIFFERENTIAL/PLATELET - Abnormal; Notable for the following components:   WBC 10.7 (*)    Hemoglobin 12.9  (*)    Neutro Abs 8.2 (*)    All other components within normal limits  URINALYSIS, ROUTINE W REFLEX MICROSCOPIC - Abnormal; Notable for the following  components:   Color, Urine YELLOW (*)    APPearance CLEAR (*)    Glucose, UA >=500 (*)    All other components within normal limits  SARS CORONAVIRUS 2 (HOSPITAL ORDER, [place] [facility])  CULTURE, BLOOD (ROUTINE X 2)  CULTURE, BLOOD (ROUTINE X 2)  URINE CULTURE  RESPIRATORY PANEL BY PCR  LACTIC ACID, PLASMA  LACTIC ACID, PLASMA  HIV ANTIBODY (ROUTINE TESTING W REFLEX)  SEDIMENTATION RATE    ____________________________________________  EKG: Normal sinus rhythm, ventricular rate 84.  PR 184, QRS 86, QTc 449.  No apparent ischemic changes. ________________________________________  RADIOLOGY All imaging, including plain films, CT scans, and ultrasounds, independently reviewed by me, and interpretations confirmed via formal radiology reads.  ED MD interpretation:   Chest x-ray: No acute abnormality Plain films of the knee and hip: No acute abnormality, hardware appears intact, no apparent knee effusion CT chest: No apparent large PE, diffuse patchy areas concerning for multifocal pneumonia  Official radiology report(s): Ct Angio Chest Pe W And/or Wo Contrast  Result Date: 10/18/2018 CLINICAL DATA:  Shortness of breath, hypoxia EXAM: CT ANGIOGRAPHY CHEST WITH CONTRAST TECHNIQUE: Multidetector CT imaging of the chest was performed using the standard protocol during bolus administration of intravenous contrast. Multiplanar CT image reconstructions and MIPs were obtained to evaluate the vascular anatomy. CONTRAST:  61m OMNIPAQUE IOHEXOL 350 MG/ML SOLN COMPARISON:  CT chest 11/29/2013 FINDINGS: Cardiovascular: Satisfactory opacification of the pulmonary arteries to the lobar level. No evidence of pulmonary embolism in the main pulmonary arteries or the lobar pulmonary arteries. Segmental and subsegmental pulmonary  arteries are suboptimally evaluated. Normal heart size. No pericardial effusion. Thoracic aortic atherosclerosis. Coronary artery atherosclerosis. Mediastinum/Nodes: No enlarged mediastinal, hilar, or axillary lymph nodes. Thyroid gland, trachea, and esophagus demonstrate no significant findings. Lungs/Pleura: Centrilobular emphysema. No focal consolidation, pleural effusion or pneumothorax. Patchy areas of ground-glass in the bilateral upper lobes, lingula and right lower lobe which may be secondary to an infectious or inflammatory etiology versus air trapping. Upper Abdomen: No acute abnormality. Musculoskeletal: No acute osseous abnormality. No aggressive osseous lesion. Review of the MIP images confirms the above findings. IMPRESSION: 1. Satisfactory opacification of the pulmonary arteries to the lobar level. No evidence of pulmonary embolism in the main pulmonary arteries or the lobar pulmonary arteries. Segmental and subsegmental pulmonary arteries are suboptimally evaluated. 2. Patchy areas of ground-glass in the bilateral upper lobes, lingula and right lower lobe which may be secondary to an infectious or inflammatory etiology versus air trapping. 3. Centrilobular emphysema. 4. Aortic Atherosclerosis (ICD10-I70.0) and Emphysema (ICD10-J43.9). Electronically Signed   By: HKathreen Devoid  On: 10/18/2018 11:53   Dg Chest Port 1 View  Result Date: 10/18/2018 CLINICAL DATA:  63year old male status post fall 4 days ago. Shortness of breath and fever. EXAM: PORTABLE CHEST 1 VIEW COMPARISON:  Chest radiographs 05/31/2017 and earlier. FINDINGS: Portable AP upright view at 0939 hours. Stable lung volumes with mild elevation of the right hemidiaphragm, normal variant. Normal cardiac size and mediastinal contours. Visualized tracheal air column is within normal limits. Allowing for portable technique the lungs are clear. No pneumothorax or pleural effusion. Paucity of bowel gas in the upper abdomen. No acute osseous  abnormality identified. IMPRESSION: Negative portable chest. Electronically Signed   By: HGenevie AnnM.D.   On: 10/18/2018 10:08   Dg Knee Right Port  Result Date: 10/18/2018 CLINICAL DATA:  63year old male status post fall 4 days ago and again today. Pain. EXAM: PORTABLE RIGHT KNEE -  1-2 VIEW COMPARISON:  Right knee series 06/23/2012. FINDINGS: Chronic right total knee arthroplasty. Hardware appears stable since 2014 and intact. 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 Chronic exostosis from the medial femoral metadiaphysis is stable. No joint effusion is evident on cross-table lateral view. No acute osseous abnormality identified. IMPRESSION: No acute fracture or dislocation identified about the right knee, and hardware intact. Electronically Signed   By: Genevie Ann M.D.   On: 10/18/2018 10:10   Dg Hip Unilat W Or Wo Pelvis 2-3 Views Left  Result Date: 10/18/2018 CLINICAL DATA:  Left hip pain after fall 3 days ago. EXAM: DG HIP (WITH OR WITHOUT PELVIS) 2-3V LEFT COMPARISON:  None. FINDINGS: There is no evidence of hip fracture or dislocation. Moderate narrowing of the left hip joint is noted. IMPRESSION: Moderate osteoarthritis of the left hip.  No acute abnormality seen. Electronically Signed   By: Marijo Conception M.D.   On: 10/18/2018 12:43    ____________________________________________  PROCEDURES   Procedure(s) performed (including Critical Care):  Ultrasound ED Soft Tissue Date/Time: 10/18/2018 10:41 AM Performed by: Duffy Bruce, MD Authorized by: Duffy Bruce, MD   Procedure details:    Indications: limb pain, localization of abscess and evaluate for cellulitis     Transverse view:  Visualized   Longitudinal view:  Visualized   Images: archived     Limitations:  Body habitus Location:    Location comment:  Right anterior knee   Side:  Right Findings:     abscess present    cellulitis present  .Critical Care Performed by: Duffy Bruce, MD Authorized by:  Duffy Bruce, MD   Critical care provider statement:    Critical care time (minutes):  35   Critical care time was exclusive of:  Separately billable procedures and treating other patients and teaching time   Critical care was necessary to treat or prevent imminent or life-threatening deterioration of the following conditions:  Circulatory failure, cardiac failure and sepsis   Critical care was time spent personally by me on the following activities:  Development of treatment plan with patient or surrogate, discussions with consultants, evaluation of patient's response to treatment, examination of patient, obtaining history from patient or surrogate, ordering and performing treatments and interventions, ordering and review of laboratory studies, ordering and review of radiographic studies, pulse oximetry, re-evaluation of patient's condition and review of old charts   I assumed direction of critical care for this patient from another provider in my specialty: no      ____________________________________________  INITIAL IMPRESSION / MDM / [place] / ED COURSE  As part of my medical decision making, I reviewed the following data within the electronic MEDICAL RECORD NUMBER Notes from prior ED visits and [place] Controlled Substance Database      *Nathan Dean was evaluated in Emergency Department on 10/18/2018 for the symptoms described in the history of present illness. He was evaluated in the context of the global COVID-19 pandemic, which necessitated consideration that the patient might be at risk for infection with the SARS-CoV-2 virus that causes COVID-19. Institutional protocols and algorithms that pertain to the evaluation of patients at risk for COVID-19 are in a state of rapid change based on information released by regulatory bodies including the CDC and federal and state organizations. These policies and algorithms were followed during the patient's care in the ED.  Some ED  evaluations and interventions may be delayed as a result of limited staffing during the pandemic.*   Clinical  Course as of Oct 17 1421  Wed Oct 18, 2018  1037 Bedside ultrasound shows subcutaneous, free fluid.  There is no discrete walled off abscess but I do suspect there is purulence in developing abscess of prepatellar space.  Given that he has had multiple surgeries with Dr. Marry Guan, most recently in 2014, will discuss with their group.   [CI]  58 Reassuring, no significant hyponatremia or evidence of AKI.  No evidence of severe sepsis on CMP.  Will follow up remainder of labs.  Comprehensive metabolic panel(!) [CI]  3875 Moderate leukocytosis which is consistent with acute infection.  CBC WITH DIFFERENTIAL(!) [CI]  1045 Normal LA - will continue cautious fluids in absence of signs of severe sepsis  Lactic acid, plasma [CI]    Clinical Course User Index [CI] Duffy Bruce, MD    Medical Decision Making: 63 year old male here with acute sepsis, likely multifactorial.  I suspect primary infection is cellulitis and possible early septic bursitis of the right knee.  Dr. Rudene Christians has seen and will consult.  Agrees with IV antibiotics.  I suspect he also has a component of possible superimposed pneumonia from his decreased mobility.  No evidence of PE.  No evidence of severe sepsis on labs.  Admit to medicine.  ____________________________________________  FINAL CLINICAL IMPRESSION(S) / ED DIAGNOSES  Final diagnoses:  Left hip pain  Sepsis due to cellulitis ([place])  Multifocal pneumonia  Acute respiratory failure with hypoxia ([facility])     MEDICATIONS GIVEN DURING THIS VISIT:  Medications  enoxaparin (LOVENOX) injection 40 mg (has no administration in time range)  0.9 %  sodium chloride infusion (has no administration in time range)  acetaminophen (TYLENOL) tablet 325-650 mg (has no administration in time range)  HYDROcodone-acetaminophen (NORCO/VICODIN) 5-325 MG per tablet 1-2 tablet  (has no administration in time range)  HYDROcodone-acetaminophen (NORCO) 7.5-325 MG per tablet 1-2 tablet (has no administration in time range)  morphine 2 MG/ML injection 0.5-1 mg (has no administration in time range)  acetaminophen (TYLENOL) tablet 500 mg (has no administration in time range)  traMADol (ULTRAM) tablet 50 mg (has no administration in time range)  insulin aspart (novoLOG) injection 0-5 Units (has no administration in time range)  insulin aspart (novoLOG) injection 0-20 Units (has no administration in time range)  aztreonam (AZACTAM) 2 g in sodium chloride 0.9 % 100 mL IVPB ( Intravenous Stopped 10/18/18 1039)  metroNIDAZOLE (FLAGYL) IVPB 500 mg ( Intravenous Stopped 10/18/18 1204)  vancomycin (VANCOCIN) IVPB 1000 mg/200 mL premix ( Intravenous Stopped 10/18/18 1128)  sodium chloride 0.9 % bolus 1,000 mL (0 mLs Intravenous Stopped 10/18/18 1120)  acetaminophen (TYLENOL) tablet 1,000 mg (1,000 mg Oral Given 10/18/18 1010)  morphine 4 MG/ML injection 6 mg (6 mg Intravenous Given 10/18/18 1009)  ondansetron (ZOFRAN) injection 4 mg (4 mg Intravenous Given 10/18/18 1009)  iohexol (OMNIPAQUE) 350 MG/ML injection 75 mL (75 mLs Intravenous Contrast Given 10/18/18 1059)     ED Discharge Orders    None       Note:  This document was prepared using Dragon voice recognition software and may include unintentional dictation errors.   Duffy Bruce, MD 10/18/18 [phone]

## 2018-10-18 NOTE — H&P (Signed)
[facility] NAME: Nathan Dean    MR#:  [id number]  DATE OF BIRTH:  1956-04-16  DATE OF ADMISSION:  10/18/2018  PRIMARY CARE PHYSICIAN: Wardell Honour, MD   REQUESTING/REFERRING PHYSICIAN: Dr. Ellender Hose.    CHIEF COMPLAINT:   Chief Complaint  Patient presents with   Fall    HISTORY OF PRESENT ILLNESS:  Nathan Dean  is a 63 y.o. male with a known history of morbid obesity, obstructive sleep apnea, hypertension hyperlipidemia GERD, and diabetes who presents to the hospital after a fall.  Patient says he was in his usual state of health and this past Saturday he fell at his daughter's house and had a small abrasion to his right knee where he had previous right knee replacement.  He had another fall today and was unable to get up from the floor and therefore came to the ER for further evaluation.  In the ER patient was noted to be in acute respiratory failure with hypoxia with room air O2 sats in the mid 80s, he was also febrile, tachycardic and therefore met sepsis criteria.  Patient underwent a CT of his chest which showed groundglass opacities and nonspecific findings suggestive of possible atypical pneumonia.  Patient's COVID-19 test is negative.  Patient denies any worsening shortness of breath, cough, congestion, chills, nausea vomiting or any other associated symptoms.  Hospitalist services were contacted for admission.  PAST MEDICAL HISTORY:   Past Medical History:  Diagnosis Date   Diabetes ([place])    GERD (gastroesophageal reflux disease)    Hyperlipidemia    Hypertension    Sleep apnea     PAST SURGICAL HISTORY:   Past Surgical History:  Procedure Laterality Date   CARPAL TUNNEL RELEASE Right 12/28/2016   Procedure: RIGHT ULNAR AND MEDIAN NEUROPLASTY AT WRIST;  Surgeon: Milly Jakob, MD;  Location: [facility];  Service: Orthopedics;  Laterality: Right;   REPLACEMENT TOTAL KNEE BILATERAL  2014     left 2012 rt 2014    SOCIAL HISTORY:   Social History   Tobacco Use   Smoking status: Former Smoker    Packs/day: 2.00    Years: 20.00    Pack years: 40.00    Types: Cigarettes    Last attempt to quit: 06/28/1996    Years since quitting: 22.3   Smokeless tobacco: Never Used  Substance Use Topics   Alcohol use: Yes    Alcohol/week: 0.0 standard drinks    Comment: socially    FAMILY HISTORY:   Family History  Problem Relation Age of Onset   Ovarian cancer Mother    Kidney failure Father    Cancer Brother     DRUG ALLERGIES:   Allergies  Allergen Reactions   Penicillins Anaphylaxis, Rash and Other (See Comments)   Succinylcholine Chloride Anaphylaxis and Rash   Keflex [Cephalexin] Rash   Sulfa Antibiotics Rash and Other (See Comments)    REVIEW OF SYSTEMS:   Review of Systems  Constitutional: Negative for fever and weight loss.  HENT: Negative for congestion, nosebleeds and tinnitus.   Eyes: Negative for blurred vision, double vision and redness.  Respiratory: Negative for cough, hemoptysis and shortness of breath.   Cardiovascular: Negative for chest pain, orthopnea, leg swelling and PND.  Gastrointestinal: Negative for abdominal pain, diarrhea, melena, nausea and vomiting.  Genitourinary: Negative for dysuria, hematuria and urgency.  Musculoskeletal: Positive for falls and joint pain (right knee ).  Neurological: Positive for weakness.  Negative for dizziness, tingling, sensory change, focal weakness, seizures and headaches.  Endo/Heme/Allergies: Negative for polydipsia. Does not bruise/bleed easily.  Psychiatric/Behavioral: Negative for depression and memory loss. The patient is not nervous/anxious.     MEDICATIONS AT HOME:   Prior to Admission medications   Medication Sig Start Date End Date Taking? Authorizing Provider  amLODipine (NORVASC) 10 MG tablet Take 0.5 tablets (5 mg total) by mouth daily. 11/15/17  Yes Sagardia, Ines Bloomer, MD   aspirin 81 MG tablet Take 1 tablet (81 mg total) by mouth daily. 08/11/16  Yes Karamalegos, Devonne Doughty, DO  Blood Glucose Monitoring Suppl (ONE TOUCH ULTRA SYSTEM KIT) w/Device KIT 1 kit by Does not apply route once. Dx. E11.9 07/28/15  Yes Arlis Porta., MD  carvedilol (COREG) 6.25 MG tablet TAKE 1 TABLET BY MOUTH TWICE A DAY WITH FOOD Patient taking differently: Take 6.25 mg by mouth 2 (two) times a day.  03/14/18  Yes Sagardia, Ines Bloomer, MD  DULoxetine (CYMBALTA) 30 MG capsule Take 30 mg by mouth daily. 07/24/18  Yes [provider]  gabapentin (NEURONTIN) 300 MG capsule TAKE 1-2 CAPSULES BY MOUTH THREE TIMES A DAY Patient taking differently: Take 900 mg by mouth 3 (three) times daily.  03/24/18  Yes Sagardia, Ines Bloomer, MD  glucose blood test strip 1 each by Other route 4 (four) times daily. Use with One touch meter to check blood sugar. Dx E11.9 09/13/16  Yes Mikey College, NP  hydrochlorothiazide (HYDRODIURIL) 12.5 MG tablet TAKE 1 TABLET BY MOUTH EVERY DAY Patient taking differently: Take 12.5 mg by mouth daily.  06/15/18  Yes Sagardia, Ines Bloomer, MD  Insulin Degludec 200 UNIT/ML SOPN Inject 80 Units into the skin at bedtime.    Yes [provider]  Insulin Pen Needle (BD PEN NEEDLE NANO U/F) 32G X 4 MM MISC Inject 1 each as directed 3 (three) times daily. 04/18/18  Yes Stallings, Zoe A, MD  losartan (COZAAR) 100 MG tablet TAKE 1 TABLET BY MOUTH EVERY DAY Patient taking differently: Take 100 mg by mouth daily.  08/16/18  Yes Sagardia, Ines Bloomer, MD  Melatonin 10 MG TABS Take 10 mg by mouth at bedtime as needed (sleep).   Yes [provider]  Multiple Vitamin (MULTIVITAMIN WITH MINERALS) TABS tablet Take 1 tablet by mouth daily.   Yes [provider]  omeprazole (PRILOSEC) 20 MG capsule Take 20 mg by mouth daily.    Yes [provider]  ONE TOUCH LANCETS MISC 1 each by Does not apply route 4 (four) times daily. Use with one touch  meter to check blood sugar. Dx: E11.9 09/16/16  Yes Mikey College, NP  rosuvastatin (CRESTOR) 20 MG tablet Take 20 mg by mouth daily.   Yes [provider]  Semaglutide (OZEMPIC, 1 MG/DOSE, [place]) Inject 1 mg into the skin once a week.    Yes [provider]  SYNJARDY 09-998 MG TABS Take 1 tablet by mouth 2 (two) times daily with a meal. 07/06/18  Yes [provider]      VITAL SIGNS:  Blood pressure 130/75, pulse 100, temperature 98.8 F (37.1 C), temperature source Oral, resp. rate (!) 24, height 6' 9"  (2.057 m), weight (!) 167.8 kg, SpO2 94 %.  PHYSICAL EXAMINATION:  Physical Exam  GENERAL:  63 y.o.-year-old obese patient lying in the bed with no acute distress.  EYES: Pupils equal, round, reactive to light and accommodation. No scleral icterus. Extraocular muscles intact.  HEENT: Head atraumatic,  normocephalic. Oropharynx and nasopharynx clear. No oropharyngeal erythema, moist oral mucosa  NECK:  Supple, no jugular venous distention. No thyroid enlargement, no tenderness.  LUNGS: Normal breath sounds bilaterally, no wheezing, rales, rhonchi. No use of accessory muscles of respiration.  CARDIOVASCULAR: S1, S2 RRR. No murmurs, rubs, gallops, clicks.  ABDOMEN: Soft, nontender, nondistended. Bowel sounds present. No organomegaly or mass.  EXTREMITIES: No pedal edema, cyanosis, or clubbing. + 2 pedal & radial pulses b/l.  Right knee swelling and slight redness with an abrasion on the knee. NEUROLOGIC: Cranial nerves II through XII are intact. No focal Motor or sensory deficits appreciated b/l. Globally weak.  PSYCHIATRIC: The patient is alert and oriented x 3.  SKIN: No obvious rash, lesion, or ulcer.   LABORATORY PANEL:   CBC Recent Labs  Lab 10/18/18 0937  WBC 10.7*  HGB 12.9*  HCT 40.0  PLT 162   ------------------------------------------------------------------------------------------------------------------  Chemistries  Recent Labs  Lab  10/18/18 0937  NA 136  K 3.7  CL 101  CO2 25  GLUCOSE 163*  BUN 14  CREATININE 0.57*  CALCIUM 8.7*  AST 26  ALT 17  ALKPHOS 63  BILITOT 1.1   ------------------------------------------------------------------------------------------------------------------  Cardiac Enzymes No results for input(s): TROPONINI in the last 168 hours. ------------------------------------------------------------------------------------------------------------------  RADIOLOGY:  Ct Angio Chest Pe W And/or Wo Contrast  Result Date: 10/18/2018 CLINICAL DATA:  Shortness of breath, hypoxia EXAM: CT ANGIOGRAPHY CHEST WITH CONTRAST TECHNIQUE: Multidetector CT imaging of the chest was performed using the standard protocol during bolus administration of intravenous contrast. Multiplanar CT image reconstructions and MIPs were obtained to evaluate the vascular anatomy. CONTRAST:  73m OMNIPAQUE IOHEXOL 350 MG/ML SOLN COMPARISON:  CT chest 11/29/2013 FINDINGS: Cardiovascular: Satisfactory opacification of the pulmonary arteries to the lobar level. No evidence of pulmonary embolism in the main pulmonary arteries or the lobar pulmonary arteries. Segmental and subsegmental pulmonary arteries are suboptimally evaluated. Normal heart size. No pericardial effusion. Thoracic aortic atherosclerosis. Coronary artery atherosclerosis. Mediastinum/Nodes: No enlarged mediastinal, hilar, or axillary lymph nodes. Thyroid gland, trachea, and esophagus demonstrate no significant findings. Lungs/Pleura: Centrilobular emphysema. No focal consolidation, pleural effusion or pneumothorax. Patchy areas of ground-glass in the bilateral upper lobes, lingula and right lower lobe which may be secondary to an infectious or inflammatory etiology versus air trapping. Upper Abdomen: No acute abnormality. Musculoskeletal: No acute osseous abnormality. No aggressive osseous lesion. Review of the MIP images confirms the above findings. IMPRESSION: 1.  Satisfactory opacification of the pulmonary arteries to the lobar level. No evidence of pulmonary embolism in the main pulmonary arteries or the lobar pulmonary arteries. Segmental and subsegmental pulmonary arteries are suboptimally evaluated. 2. Patchy areas of ground-glass in the bilateral upper lobes, lingula and right lower lobe which may be secondary to an infectious or inflammatory etiology versus air trapping. 3. Centrilobular emphysema. 4. Aortic Atherosclerosis (ICD10-I70.0) and Emphysema (ICD10-J43.9). Electronically Signed   By: HKathreen Devoid  On: 10/18/2018 11:53   Dg Chest Port 1 View  Result Date: 10/18/2018 CLINICAL DATA:  63year old male status post fall 4 days ago. Shortness of breath and fever. EXAM: PORTABLE CHEST 1 VIEW COMPARISON:  Chest radiographs 05/31/2017 and earlier. FINDINGS: Portable AP upright view at 0939 hours. Stable lung volumes with mild elevation of the right hemidiaphragm, normal variant. Normal cardiac size and mediastinal contours. Visualized tracheal air column is within normal limits. Allowing for portable technique the lungs are clear. No pneumothorax or pleural effusion. Paucity of bowel gas in the upper abdomen. No acute osseous  abnormality identified. IMPRESSION: Negative portable chest. Electronically Signed   By: Genevie Ann M.D.   On: 10/18/2018 10:08   Dg Knee Right Port  Result Date: 10/18/2018 CLINICAL DATA:  63 year old male status post fall 4 days ago and again today. Pain. EXAM: PORTABLE RIGHT KNEE - 1-2 VIEW COMPARISON:  Right knee series 06/23/2012. FINDINGS: Chronic right total knee arthroplasty. Hardware appears stable since 2014 and intact. 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 Chronic exostosis from the medial femoral metadiaphysis is stable. No joint effusion is evident on cross-table lateral view. No acute osseous abnormality identified. IMPRESSION: No acute fracture or dislocation identified about the right knee, and hardware intact.  Electronically Signed   By: Genevie Ann M.D.   On: 10/18/2018 10:10   Dg Hip Unilat W Or Wo Pelvis 2-3 Views Left  Result Date: 10/18/2018 CLINICAL DATA:  Left hip pain after fall 3 days ago. EXAM: DG HIP (WITH OR WITHOUT PELVIS) 2-3V LEFT COMPARISON:  None. FINDINGS: There is no evidence of hip fracture or dislocation. Moderate narrowing of the left hip joint is noted. IMPRESSION: Moderate osteoarthritis of the left hip.  No acute abnormality seen. Electronically Signed   By: Marijo Conception M.D.   On: 10/18/2018 12:43     IMPRESSION AND PLAN:   63 year old male with past medical history of diabetes, hypertension, obstructive sleep apnea, hyperlipidemia, GERD who presented to the hospital after a fall and also noted to be in acute respiratory failure with hypoxia.  1.  Sepsis-patient meets criteria on admission given fever, leukocytosis and chest x-ray findings suggestive of suspected pneumonia. - We will continue broad-spectrum IV antibiotics with vancomycin, aztreonam and Flagyl as patient is penicillin allergic. -Follow cultures.  2.  Acute respiratory failure with hypoxia- secondary to underlying COPD with superimposed pneumonia. - Patient CT chest shows no acute pulmonary embolism but groundglass opacities and also lingular and right lower lobe pneumonia. - Continue O2 supplementation, will treat with broad-spectrum IV antibiotics as mentioned above. -Wean off oxygen as tolerated.  3.  Pneumonia-source of patient's sepsis/hypoxia as mentioned. -Continue broad-spectrum IV antibiotics with vancomycin, aztreonam, Flagyl.  Follow cultures.  4.  Recurrent falls/weakness- we will get physical therapy consult to assess mobility.  5.  Right knee pain swelling redness- orthopedic consult obtained. - Possible cellulitis with underlying septic arthritis, continue IV antibiotics, await further orthopedic input regarding possible need for arthrocentesis.  6.  Obstructive sleep apnea-continue  CPAP.  7.  Essential hypertension-continue Norvasc, carvedilol, losartan, HCTZ.  8.  Diabetes type 2 with underlying neuropathy- continue sliding scale insulin, insulin Decludec.  - carb control diet  9. Diabetic neuropathy - cont. Gabapentin.    10. Hyperlipidemia - cont. Crestor.   All the records are reviewed and case discussed with ED provider. Management plans discussed with the patient, family and they are in agreement.  CODE STATUS: Full code  TOTAL TIME TAKING CARE OF THIS PATIENT: 45 minutes.    Henreitta Leber M.D on 10/18/2018 at 1:45 PM  Between 7am to 6pm - Pager - [phone]  After 6pm go to [email or web address] - password [email or web address]  Office  [phone]  CC: Primary care physician; Wardell Honour, MD

## 2018-10-18 NOTE — ED Notes (Signed)
Patient transported to CT 

## 2018-10-18 NOTE — Consult Note (Signed)
Patient is a 63 year old who had a revision right total knee done in 2014 by Dr. Ernest Pine and is done well with this.  He suffered a fall a week ago and then again today with development of an infection over the anterior knee.  He has a large area of erythema approximately 5 cm directly over the knee and a more extensive area of cellulitis approximately 10 cm in diameter.  It appears to be all anterior and does not appear to extend into the joint although does appear to extend into the bursa at this time.  Additionally has been having hypoxia and pulmonary issues probably related to not being able to walk around as well.  Additionally on exam he is noted to have significant peripheral edema in both lower extremities he has a prior medial parapatellar skin incision for the right knee with long scar from initial and and subsequent surgery.  Distally he has palpable pulses posterior tib and dorsalis pedis with intact sensation to the foot plantar and dorsal and able to flex and the toes he has some pain with range of motion of the knee but not severe.  Impression is infected prepatellar bursa with multiple prior knee surgeries and medial parapatellar skin incision previously which means there may be diminished blood flow to the area of the infection and concern for potential progression into the joint which could be catastrophic  Recommendation is for antibiotics and close follow-up of this.  Additionally medical care for hypoxia.

## 2018-10-18 NOTE — Consult Note (Signed)
CODE SEPSIS - PHARMACY COMMUNICATION  **Broad Spectrum Antibiotics should be administered within 1 hour of Sepsis diagnosis**  Time Code Sepsis Called/Page Received: 4037  Antibiotics Ordered: vancomycin, aztreonam and metronidazole  Time of 1st antibiotic administration: 1007  Additional action taken by pharmacy: none required  If necessary, Name of Provider/Nurse Contacted: N/A  Lowella Bandy ,PharmD Clinical Pharmacist  10/18/2018  10:54 AM

## 2018-10-18 NOTE — Progress Notes (Signed)
Pharmacy Antibiotic Note  Nathan Dean is a 63 y.o. male admitted on 10/18/2018 with pneumonia and cellulitis.  Pharmacy has been consulted for Vancomcyin and Aztreonam dosing.  Plan: Vancomycin 2000 mg IV Q 12 hrs. Goal AUC 400-550. Expected AUC: 425.5 SCr used: 0.8 Expected Cmin: 10.5 Will need to watch renal function closely  Aztreonam 2g IV q8h, patient with PCN and Keflex allergy.   Height: 6\' 9"  (205.7 cm) Weight: (!) 370 lb (167.8 kg) IBW/kg (Calculated) : 98.3  Temp (24hrs), Avg:100.1 F (37.8 C), Min:98.8 F (37.1 C), Max:101.4 F (38.6 C)  Recent Labs  Lab 10/18/18 0937  WBC 10.7*  CREATININE 0.57*  LATICACIDVEN 1.3    Estimated Creatinine Clearance: 166.4 mL/min (A) (by C-G formula based on SCr of 0.57 mg/dL (L)).    Allergies  Allergen Reactions  . Penicillins Anaphylaxis, Rash and Other (See Comments)  . Succinylcholine Chloride Anaphylaxis and Rash  . Keflex [Cephalexin] Rash  . Sulfa Antibiotics Rash and Other (See Comments)    Antimicrobials this admission: Vancomycin 6/3 >>  Aztreonam 6/3 >>  Metronidazole 6/3 >>   Thank you for allowing pharmacy to be a part of this patient's care.  Clovia Cuff, PharmD, BCPS 10/18/2018 2:54 PM

## 2018-10-19 LAB — CBC
HCT: 37.6 % — ABNORMAL LOW (ref 39.0–52.0)
Hemoglobin: 11.8 g/dL — ABNORMAL LOW (ref 13.0–17.0)
MCH: 28 pg (ref 26.0–34.0)
MCHC: 31.4 g/dL (ref 30.0–36.0)
MCV: 89.1 fL (ref 80.0–100.0)
Platelets: 154 10*3/uL (ref 150–400)
RBC: 4.22 MIL/uL (ref 4.22–5.81)
RDW: 14.5 % (ref 11.5–15.5)
WBC: 9.4 10*3/uL (ref 4.0–10.5)
nRBC: 0 % (ref 0.0–0.2)

## 2018-10-19 LAB — HIV ANTIBODY (ROUTINE TESTING W REFLEX): HIV Screen 4th Generation wRfx: NONREACTIVE

## 2018-10-19 LAB — URINE CULTURE: Culture: NO GROWTH

## 2018-10-19 LAB — GLUCOSE, CAPILLARY
Glucose-Capillary: 86 mg/dL (ref 70–99)
Glucose-Capillary: 162 mg/dL — ABNORMAL HIGH (ref 70–99)
Glucose-Capillary: 136 mg/dL — ABNORMAL HIGH (ref 70–99)
Glucose-Capillary: 134 mg/dL — ABNORMAL HIGH (ref 70–99)

## 2018-10-19 LAB — BASIC METABOLIC PANEL
Anion gap: 7 (ref 5–15)
BUN: 16 mg/dL (ref 8–23)
CO2: 29 mmol/L (ref 22–32)
Calcium: 8.5 mg/dL — ABNORMAL LOW (ref 8.9–10.3)
Chloride: 104 mmol/L (ref 98–111)
Creatinine, Ser: 0.76 mg/dL (ref 0.61–1.24)
GFR calc Af Amer: >60 mL/min (ref >60)
GFR calc non Af Amer: >60 mL/min (ref >60)
Glucose, Bld: 161 mg/dL — ABNORMAL HIGH (ref 70–99)
Potassium: 3.7 mmol/L (ref 3.5–5.1)
Sodium: 140 mmol/L (ref 135–145)

## 2018-10-19 LAB — MRSA PCR SCREENING: MRSA by PCR: NEGATIVE

## 2018-10-19 MED ORDER — PREDNISONE 50 MG PO TABS
50.0000 mg | ORAL_TABLET | Freq: Every day | ORAL | Status: DC
  Administered 2018-10-20: 50 mg via ORAL
  Filled 2018-10-19 (×2): qty 1

## 2018-10-19 MED ORDER — BUDESONIDE 0.5 MG/2ML IN SUSP
0.5000 mg | Freq: Two times a day (BID) | RESPIRATORY_TRACT | Status: DC
  Administered 2018-10-19 – 2018-10-21 (×4): 0.5 mg via RESPIRATORY_TRACT
  Filled 2018-10-19 (×4): qty 2

## 2018-10-19 MED ORDER — ENOXAPARIN SODIUM 40 MG/0.4ML ~~LOC~~ SOLN
40.0000 mg | Freq: Two times a day (BID) | SUBCUTANEOUS | Status: DC
  Administered 2018-10-19 – 2018-10-20 (×4): 40 mg via SUBCUTANEOUS
  Filled 2018-10-19 (×5): qty 0.4

## 2018-10-19 MED ORDER — HYDRALAZINE HCL 20 MG/ML IJ SOLN
10.0000 mg | Freq: Four times a day (QID) | INTRAMUSCULAR | Status: DC | PRN
  Administered 2018-10-19: 10 mg via INTRAVENOUS
  Filled 2018-10-19: qty 1

## 2018-10-19 MED ORDER — IBUPROFEN 400 MG PO TABS
800.0000 mg | ORAL_TABLET | Freq: Once | ORAL | Status: AC
  Administered 2018-10-19: 800 mg via ORAL
  Filled 2018-10-19: qty 2

## 2018-10-19 MED ORDER — IPRATROPIUM-ALBUTEROL 0.5-2.5 (3) MG/3ML IN SOLN
3.0000 mL | Freq: Four times a day (QID) | RESPIRATORY_TRACT | Status: DC
  Administered 2018-10-19: 20:00:00 3 mL via RESPIRATORY_TRACT
  Filled 2018-10-19: qty 3

## 2018-10-19 NOTE — Progress Notes (Signed)
Fever unrelieved by tylenol. Order for 800mg  ibuprofen per Dr. Cherlynn Kaiser.

## 2018-10-19 NOTE — Progress Notes (Signed)
Patient declined changing to low bed after falling earlier today. Educated on safety. Alert and Oriented. Agrees not to get up without calling.

## 2018-10-19 NOTE — Progress Notes (Signed)
Patient had fall this afternoon approximately 1400. Staff came to room after hearing bed alarm and patient was sitting on the floor stating that he was trying to use his urinal and slipped. He denies hitting his head or any other injuries. Reports pain to right knee which was related to admission. Scabbed area on right knee is now open with minimal bleeding. Assisted to back to bed via lift and multiple staff assistance. Dr. Cherlynn Kaiser and wife updated. Low bed ordered. Will continue to monitor.

## 2018-10-19 NOTE — Progress Notes (Signed)
[place] [facility] [place] NAME: Nathan Dean    MR#:  [id number]  DATE OF BIRTH:  1955/10/31  SUBJECTIVE:   She presented to the hospital due to a fall and also acute respiratory failure with hypoxia.  Patient still remains on 3 L nasal cannula.  Patient had a fall this afternoon and has a right knee abrasion.  REVIEW OF SYSTEMS:    Review of Systems  Constitutional: Negative for chills and fever.  HENT: Negative for congestion and tinnitus.   Eyes: Negative for blurred vision and double vision.  Respiratory: Negative for cough, shortness of breath and wheezing.   Cardiovascular: Negative for chest pain, orthopnea and PND.  Gastrointestinal: Negative for abdominal pain, diarrhea, nausea and vomiting.  Genitourinary: Negative for dysuria and hematuria.  Musculoskeletal: Positive for falls.  Neurological: Negative for dizziness, sensory change and focal weakness.  All other systems reviewed and are negative.   Nutrition: Heart Healthy/Carb modified.  Tolerating Diet: Yes Tolerating PT: Eval noted.   DRUG ALLERGIES:   Allergies  Allergen Reactions   Penicillins Anaphylaxis, Rash and Other (See Comments)   Succinylcholine Chloride Anaphylaxis and Rash   Keflex [Cephalexin] Rash   Sulfa Antibiotics Rash and Other (See Comments)    VITALS:  Blood pressure (!) 152/71, pulse 93, temperature 98.8 F (37.1 C), temperature source Oral, resp. rate 18, height 6' 9"  (2.057 m), weight (!) 167.8 kg, SpO2 91 %.  PHYSICAL EXAMINATION:   Physical Exam  GENERAL:  63 y.o.-year-old obese patient lying in bed in no acute distress.  EYES: Pupils equal, round, reactive to light and accommodation. No scleral icterus. Extraocular muscles intact.  HEENT: Head atraumatic, normocephalic. Oropharynx and nasopharynx clear.  NECK:  Supple, no jugular venous distention. No thyroid enlargement, no tenderness.  LUNGS: Normal breath sounds bilaterally, no  wheezing, rales, rhonchi. No use of accessory muscles of respiration.  CARDIOVASCULAR: S1, S2 normal. No murmurs, rubs, or gallops.  ABDOMEN: Soft, nontender, nondistended. Bowel sounds present. No organomegaly or mass.  EXTREMITIES: No cyanosis, clubbing or edema b/l.  Right knee swelling with abrasion noted.    NEUROLOGIC: Cranial nerves II through XII are intact. No focal Motor or sensory deficits b/l.   PSYCHIATRIC: The patient is alert and oriented x 3.  SKIN: No obvious rash, lesion, or ulcer.    LABORATORY PANEL:   CBC Recent Labs  Lab 10/19/18 0308  WBC 9.4  HGB 11.8*  HCT 37.6*  PLT 154   ------------------------------------------------------------------------------------------------------------------  Chemistries  Recent Labs  Lab 10/18/18 0937 10/19/18 0308  NA 136 140  K 3.7 3.7  CL 101 104  CO2 25 29  GLUCOSE 163* 161*  BUN 14 16  CREATININE 0.57* 0.76  CALCIUM 8.7* 8.5*  AST 26  --   ALT 17  --   ALKPHOS 63  --   BILITOT 1.1  --    ------------------------------------------------------------------------------------------------------------------  Cardiac Enzymes No results for input(s): TROPONINI in the last 168 hours. ------------------------------------------------------------------------------------------------------------------  RADIOLOGY:  Ct Angio Chest Pe W And/or Wo Contrast  Result Date: 10/18/2018 CLINICAL DATA:  Shortness of breath, hypoxia EXAM: CT ANGIOGRAPHY CHEST WITH CONTRAST TECHNIQUE: Multidetector CT imaging of the chest was performed using the standard protocol during bolus administration of intravenous contrast. Multiplanar CT image reconstructions and MIPs were obtained to evaluate the vascular anatomy. CONTRAST:  23m OMNIPAQUE IOHEXOL 350 MG/ML SOLN COMPARISON:  CT chest 11/29/2013 FINDINGS: Cardiovascular: Satisfactory opacification of the pulmonary arteries to the lobar  level. No evidence of pulmonary embolism in the main  pulmonary arteries or the lobar pulmonary arteries. Segmental and subsegmental pulmonary arteries are suboptimally evaluated. Normal heart size. No pericardial effusion. Thoracic aortic atherosclerosis. Coronary artery atherosclerosis. Mediastinum/Nodes: No enlarged mediastinal, hilar, or axillary lymph nodes. Thyroid gland, trachea, and esophagus demonstrate no significant findings. Lungs/Pleura: Centrilobular emphysema. No focal consolidation, pleural effusion or pneumothorax. Patchy areas of ground-glass in the bilateral upper lobes, lingula and right lower lobe which may be secondary to an infectious or inflammatory etiology versus air trapping. Upper Abdomen: No acute abnormality. Musculoskeletal: No acute osseous abnormality. No aggressive osseous lesion. Review of the MIP images confirms the above findings. IMPRESSION: 1. Satisfactory opacification of the pulmonary arteries to the lobar level. No evidence of pulmonary embolism in the main pulmonary arteries or the lobar pulmonary arteries. Segmental and subsegmental pulmonary arteries are suboptimally evaluated. 2. Patchy areas of ground-glass in the bilateral upper lobes, lingula and right lower lobe which may be secondary to an infectious or inflammatory etiology versus air trapping. 3. Centrilobular emphysema. 4. Aortic Atherosclerosis (ICD10-I70.0) and Emphysema (ICD10-J43.9). Electronically Signed   By: Kathreen Devoid   On: 10/18/2018 11:53   Dg Chest Port 1 View  Result Date: 10/18/2018 CLINICAL DATA:  63 year old male status post fall 4 days ago. Shortness of breath and fever. EXAM: PORTABLE CHEST 1 VIEW COMPARISON:  Chest radiographs 05/31/2017 and earlier. FINDINGS: Portable AP upright view at 0939 hours. Stable lung volumes with mild elevation of the right hemidiaphragm, normal variant. Normal cardiac size and mediastinal contours. Visualized tracheal air column is within normal limits. Allowing for portable technique the lungs are clear. No  pneumothorax or pleural effusion. Paucity of bowel gas in the upper abdomen. No acute osseous abnormality identified. IMPRESSION: Negative portable chest. Electronically Signed   By: Genevie Ann M.D.   On: 10/18/2018 10:08   Dg Knee Right Port  Result Date: 10/18/2018 CLINICAL DATA:  63 year old male status post fall 4 days ago and again today. Pain. EXAM: PORTABLE RIGHT KNEE - 1-2 VIEW COMPARISON:  Right knee series 06/23/2012. FINDINGS: Chronic right total knee arthroplasty. Hardware appears stable since 2014 and intact. 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 Chronic exostosis from the medial femoral metadiaphysis is stable. No joint effusion is evident on cross-table lateral view. No acute osseous abnormality identified. IMPRESSION: No acute fracture or dislocation identified about the right knee, and hardware intact. Electronically Signed   By: Genevie Ann M.D.   On: 10/18/2018 10:10   Dg Hip Unilat W Or Wo Pelvis 2-3 Views Left  Result Date: 10/18/2018 CLINICAL DATA:  Left hip pain after fall 3 days ago. EXAM: DG HIP (WITH OR WITHOUT PELVIS) 2-3V LEFT COMPARISON:  None. FINDINGS: There is no evidence of hip fracture or dislocation. Moderate narrowing of the left hip joint is noted. IMPRESSION: Moderate osteoarthritis of the left hip.  No acute abnormality seen. Electronically Signed   By: Marijo Conception M.D.   On: 10/18/2018 12:43     ASSESSMENT AND PLAN:   63 year old male with past medical history of diabetes, hypertension, obstructive sleep apnea, hyperlipidemia, GERD who presented to the hospital after a fall and also noted to be in acute respiratory failure with hypoxia.  1.  Sepsis-patient met criteria on admission given fever, leukocytosis and chest x-ray findings suggestive of suspected pneumonia - continue broad-spectrum IV antibiotics with vancomycin, aztreonam and Flagyl as patient is penicillin allergic. Will get MRSA PCR if (-) will d/c Lucianne Lei.  -Follow  cultures which are (-)  so far.   2.  Acute respiratory failure with hypoxia- secondary to underlying COPD with superimposed pneumonia. - Patient CT chest shows no acute pulmonary embolism but groundglass opacities and also lingular and right lower lobe pneumonia. - Continue O2 supplementation, will treat with broad-spectrum IV antibiotics as mentioned above. -Wean off oxygen as tolerated.  3.  Pneumonia-source of patient's sepsis/hypoxia as mentioned. -Continue broad-spectrum IV antibiotics with vancomycin, aztreonam, Flagyl for now. Await MRSA PCR and if (-) then d/c Vanc.   4.  Recurrent falls/weakness - appreciate PT eval and will arrange home health upon discharge.   5.  Right knee pain swelling redness- orthopedic consult obtained. -Seen by Dr. Marry Guan who does not think that the patient needs any arthrocentesis but continue IV antibiotics and supportive care for now..  6.  Obstructive sleep apnea-continue CPAP.  7.  Essential hypertension-continue Norvasc, carvedilol, losartan, HCTZ.  8.  Diabetes type 2 with underlying neuropathy- continue sliding scale insulin, insulin Decludec.  - carb control diet. BS stable.   9. Diabetic neuropathy - cont. Gabapentin.    10. Hyperlipidemia - cont. Crestor.     All the records are reviewed and case discussed with Care Management/Social Worker. Management plans discussed with the patient, family and they are in agreement.  CODE STATUS: Full code  DVT Prophylaxis: Lovenox  TOTAL TIME TAKING CARE OF THIS PATIENT: 30 minutes.   POSSIBLE D/C IN 1-2 DAYS, DEPENDING ON CLINICAL CONDITION.   Henreitta Leber M.D on 10/19/2018 at 2:42 PM  Between 7am to 6pm - Pager - [phone]  After 6pm go to [email or web address] - Technical brewer [place] [facility]  Office  [phone]  CC: Primary care physician; Horald Pollen, MD

## 2018-10-19 NOTE — Progress Notes (Signed)
Lovenox dose adjustment. Patient admitted for fall and is on lovenox 40 mg subq daily. Patient's BW: 167.8 kg CrCl 166.4 ml/min  Will adjust dose to lovenox 40 mg subq bid since BMI > 40 and CrCl 30 ml/min  Thomasene Ripple, PharmD, BCPS Clinical Pharmacist 10/19/2018

## 2018-10-19 NOTE — Evaluation (Signed)
Physical Therapy Evaluation Patient Details Name: Nathan Dean MRN: [id number] DOB: 12-13-1955 Today's Date: 10/19/2018   History of Present Illness  From MD H&P 10/18/18:  Pt is a 63 y.o. male with a known history of morbid obesity, obstructive sleep apnea, hypertension hyperlipidemia GERD, and diabetes who presents to the hospital after a fall.  Patient says he was in his usual state of health and this past Saturday he fell at his daughter's house and had a small abrasion to his right knee where he had previous right knee replacement.  He had another fall today and was unable to get up from the floor and therefore came to the ER for further evaluation.  In the ER patient was noted to be in acute respiratory failure with hypoxia with room air O2 sats in the mid 80s, he was also febrile, tachycardic and therefore met sepsis criteria.  Patient underwent a CT of his chest which showed groundglass opacities and nonspecific findings suggestive of possible atypical pneumonia.  Patient denies any worsening shortness of breath, cough, congestion, chills, nausea vomiting or any other associated symptoms.  Hospitalist services were contacted for admission.  Assessment includes: Sepsis, Acute respiratory failure with hypoxia, PNA, recurrent falls/weakness, R knee pain/swelling/redness, OSA, HTN, DM II, diabetic neuropathy, and HLD.      Clinical Impression  Pt presents with min deficits in strength, transfers, gait, balance, and activity tolerance but was primarily limited by R knee pain with mobility.  Pt was Ind with bed mobility tasks and stood from the EOB with good speed and effort initially although after multiple sit to/from stands the pt began to require extra time and effort to stand secondary to R knee pain.  The pt was only able to amb 10' again due to R knee pain with weight bearing.  Pt does have a history of falling but was mostly steady during limited time in standing with a RW.  Pt will benefit from  HHPT services upon discharge to safely address above deficits for decreased caregiver assistance and eventual return to PLOF.      Follow Up Recommendations Home health PT    Equipment Recommendations  None recommended by PT    Recommendations for Other Services       Precautions / Restrictions Precautions Precautions: Fall Restrictions Weight Bearing Restrictions: No      Mobility  Bed Mobility Overal bed mobility: Independent                Transfers Overall transfer level: Needs assistance Equipment used: Rolling walker (2 wheeled) Transfers: Sit to/from Stand Sit to Stand: Min guard         General transfer comment: Good eccentric and concentric control  Ambulation/Gait Ambulation/Gait assistance: Min guard Gait Distance (Feet): 10 Feet Assistive device: Rolling walker (2 wheeled) Gait Pattern/deviations: Step-through pattern;Decreased step length - right;Decreased step length - left Gait velocity: decreased   General Gait Details: Pt limited with amb distance by R knee pain, SpO2 and HR WNL on 4LO2/min  Stairs            Wheelchair Mobility    Modified Rankin (Stroke Patients Only)       Balance Overall balance assessment: Mild deficits observed, not formally tested;History of Falls                                           Pertinent Vitals/Pain  Pain Assessment: 0-10 Pain Score: 6  Pain Location: R knee and L hip Pain Descriptors / Indicators: Sore Pain Intervention(s): Premedicated before session;Monitored during session    Home Living Family/patient expects to be discharged to:: Private residence Living Arrangements: Spouse/significant other Available Help at Discharge: Family;Available 24 hours/day Type of Home: House Home Access: Stairs to enter Entrance Stairs-Rails: Right;Left;Can reach both Entrance Stairs-Number of Steps: 4 Home Layout: One level Home Equipment: Toilet riser;Walker - 2 wheels;Other  (comment) Additional Comments: owns tripod cane    Prior Function Level of Independence: Independent with assistive device(s)         Comments: Ind amb in the home without an AD, tripod cane in the community, Ind with ADLs, 3 falls in the last 6 months     Hand Dominance        Extremity/Trunk Assessment   Upper Extremity Assessment Upper Extremity Assessment: Overall WFL for tasks assessed    Lower Extremity Assessment Lower Extremity Assessment: Generalized weakness       Communication   Communication: No difficulties  Cognition Arousal/Alertness: Awake/alert Behavior During Therapy: WFL for tasks assessed/performed Overall Cognitive Status: Within Functional Limits for tasks assessed                                        General Comments      Exercises Total Joint Exercises Ankle Circles/Pumps: AROM;Both;10 reps Quad Sets: Strengthening;Both;10 reps Gluteal Sets: Strengthening;Both;10 reps Hip ABduction/ADduction: AROM;Both;5 reps Straight Leg Raises: AROM;Both;5 reps Long Arc Quad: AROM;Both;10 reps;15 reps Knee Flexion: AROM;Both;10 reps;15 reps Other Exercises Other Exercises: HEP for BLE APs, GS, QS, and LAQ x 10 each 5-6x/day   Assessment/Plan    PT Assessment Patient needs continued PT services  PT Problem List Decreased strength;Decreased activity tolerance;Decreased balance;Pain       PT Treatment Interventions DME instruction;Gait training;Stair training;Functional mobility training;Therapeutic activities;Therapeutic exercise;Balance training;Patient/family education    PT Goals (Current goals can be found in the Care Plan section)  Acute Rehab PT Goals Patient Stated Goal: To get stronger PT Goal Formulation: With patient Time For Goal Achievement: 11/01/18 Potential to Achieve Goals: Good    Frequency Min 2X/week   Barriers to discharge        Co-evaluation               AM-PAC PT "6 Clicks" Mobility   Outcome Measure Help needed turning from your back to your side while in a flat bed without using bedrails?: None Help needed moving from lying on your back to sitting on the side of a flat bed without using bedrails?: None Help needed moving to and from a bed to a chair (including a wheelchair)?: A Little Help needed standing up from a chair using your arms (e.g., wheelchair or bedside chair)?: A Little Help needed to walk in hospital room?: A Little Help needed climbing 3-5 steps with a railing? : A Little 6 Click Score: 20    End of Session Equipment Utilized During Treatment: Gait belt;Oxygen Activity Tolerance: Patient limited by pain Patient left: in bed;with bed alarm set;with call bell/phone within reach Nurse Communication: Mobility status PT Visit Diagnosis: Unsteadiness on feet (R26.81);History of falling (Z91.81);Muscle weakness (generalized) (M62.81);Difficulty in walking, not elsewhere classified (R26.2);Pain Pain - Right/Left: Right Pain - part of body: Knee    Time: 1116-1150 PT Time Calculation (min) (ACUTE ONLY): 34 min   Charges:  PT Evaluation $PT Eval Low Complexity: 1 Low PT Treatments $Therapeutic Exercise: 8-22 mins        D. Scott Deshawn Witty PT, DPT 10/19/18, 1:58 PM

## 2018-10-19 NOTE — Consult Note (Signed)
ORTHOPAEDICS PROGRESS NOTE  PATIENT NAME: Nathan Dean DOB: Sep 13, 1955  MRN: [id number]  Subjective: The patient still reports some soreness to the right thigh and knee.  Objective: Vital signs in last 24 hours: Temp:  [98 F (36.7 C)-101.4 F (38.6 C)] 98.8 F (37.1 C) (06/04 0743) Pulse Rate:  [67-110] 81 (06/04 0743) Resp:  [16-26] 17 (06/04 0743) BP: (129-165)/(71-95) 165/95 (06/04 0743) SpO2:  [84 %-100 %] 100 % (06/04 0743) Weight:  [167.8 kg] 167.8 kg (06/03 0929)  Intake/Output from previous day: 06/03 0701 - 06/04 0700 In: 3318.2 [P.O.:360; I.V.:763.4; IV Piggyback:2194.8] Out: 2550 [Urine:2550]  Recent Labs    10/18/18 0937 10/19/18 0308  WBC 10.7* 9.4  HGB 12.9* 11.8*  HCT 40.0 37.6*  PLT 162 154  K 3.7 3.7  CL 101 104  CO2 25 29  BUN 14 16  CREATININE 0.57* 0.76  GLUCOSE 163* 161*  CALCIUM 8.7* 8.5*    EXAM General: Well-developed well-nourished male seen in no apparent discomfort. Right lower extremity: Evaluation of the right knee demonstrates multiple well-healed incisions.  There is an abrasion to the anterior aspect of the knee with a clean base.  No purulence.  Some surrounding erythema is noted.  No apparent knee effusion.  Some tenderness to palpation about the quadriceps.  Fair to good quadriceps tone. Neurologic: Awake, alert, and oriented.  Sensory and motor function are grossly intact.  Assessment: Right prepatellar abrasion with localized cellulitis  Secondary diagnoses: Status post right total knee revision arthroplasty Sleep apnea Hypertension Hyperlipidemia Gastroesophageal reflux disease Diabetes  Plan: The patient has been afebrile.  White count has also improved while on antibiotics. I do not appreciate a knee effusion or even a gross fluid collection within the prepatellar bursa.  No indication for aspiration at this time.  I would agree with continuation of antibiotics. I will order [facility] to the right knee.  Demaya Hardge  P. Angie Fava M.D.

## 2018-10-20 LAB — CBC
HCT: 36.7 % — ABNORMAL LOW (ref 39.0–52.0)
Hemoglobin: 11.4 g/dL — ABNORMAL LOW (ref 13.0–17.0)
MCH: 27.7 pg (ref 26.0–34.0)
MCHC: 31.1 g/dL (ref 30.0–36.0)
MCV: 89.3 fL (ref 80.0–100.0)
Platelets: 168 10*3/uL (ref 150–400)
RBC: 4.11 MIL/uL — ABNORMAL LOW (ref 4.22–5.81)
RDW: 14.6 % (ref 11.5–15.5)
WBC: 14.2 10*3/uL — ABNORMAL HIGH (ref 4.0–10.5)
nRBC: 0 % (ref 0.0–0.2)

## 2018-10-20 LAB — BASIC METABOLIC PANEL
Anion gap: 8 (ref 5–15)
BUN: 22 mg/dL (ref 8–23)
CO2: 26 mmol/L (ref 22–32)
Calcium: 8 mg/dL — ABNORMAL LOW (ref 8.9–10.3)
Chloride: 106 mmol/L (ref 98–111)
Creatinine, Ser: 0.76 mg/dL (ref 0.61–1.24)
GFR calc Af Amer: >60 mL/min (ref >60)
GFR calc non Af Amer: >60 mL/min (ref >60)
Glucose, Bld: 96 mg/dL (ref 70–99)
Potassium: 3.1 mmol/L — ABNORMAL LOW (ref 3.5–5.1)
Sodium: 140 mmol/L (ref 135–145)

## 2018-10-20 LAB — GLUCOSE, CAPILLARY
Glucose-Capillary: 67 mg/dL — ABNORMAL LOW (ref 70–99)
Glucose-Capillary: 206 mg/dL — ABNORMAL HIGH (ref 70–99)
Glucose-Capillary: 340 mg/dL — ABNORMAL HIGH (ref 70–99)
Glucose-Capillary: 222 mg/dL — ABNORMAL HIGH (ref 70–99)

## 2018-10-20 MED ORDER — IPRATROPIUM-ALBUTEROL 0.5-2.5 (3) MG/3ML IN SOLN: 3.0000 mL | Freq: Four times a day (QID) | RESPIRATORY_TRACT | Status: DC | PRN

## 2018-10-20 MED ORDER — IPRATROPIUM-ALBUTEROL 0.5-2.5 (3) MG/3ML IN SOLN
3.0000 mL | Freq: Three times a day (TID) | RESPIRATORY_TRACT | Status: DC
  Administered 2018-10-20 – 2018-10-21 (×4): 3 mL via RESPIRATORY_TRACT
  Filled 2018-10-20 (×4): qty 3

## 2018-10-20 MED ORDER — POTASSIUM CHLORIDE CRYS ER 20 MEQ PO TBCR
40.0000 meq | EXTENDED_RELEASE_TABLET | Freq: Once | ORAL | Status: AC
  Administered 2018-10-20: 40 meq via ORAL
  Filled 2018-10-20: qty 2

## 2018-10-20 MED ORDER — METRONIDAZOLE 500 MG PO TABS
500.0000 mg | ORAL_TABLET | Freq: Three times a day (TID) | ORAL | Status: DC
  Administered 2018-10-20 – 2018-10-21 (×3): 500 mg via ORAL
  Filled 2018-10-20 (×3): qty 1

## 2018-10-20 NOTE — TOC Initial Note (Signed)
Transition of Care [facility]) - Initial/Assessment Note    Patient Details  Name: Nathan Dean MRN: [id number] Date of Birth: 05-26-1955  Transition of [facility]) CM/SW Contact:    Su Hilt, RN Phone Number: 10/20/2018, 11:49 AM  Clinical Narrative:                 Met with the patient to discuss DC Plan and needs, he lives at home with his wife and has a rw at home, he states that he used [facility] in the past when he had knee surgery and chose [facility] for this discharge, he states that he has no other needs at home, he still drives and when he cant his wife drives him  He goes to [place] for PCP and is up to date, [facility] and can afford his medications  Will continue to follow for needs  Expected Discharge Plan: [place] Barriers to Discharge: Continued Medical Work up   Patient Goals and CMS Choice Patient states their goals for this hospitalization and ongoing recovery are:: go home CMS Medicare.gov Compare Post Acute Care list provided to:: Patient Choice offered to / list presented to : Patient  Expected Discharge Plan and Services Expected Discharge Plan: [place]   Discharge Planning Services: CM Consult Post Acute Care Choice: [place] arrangements for the past 2 months: Single Family Home                 DME Arranged: Patient refused services         HH Arranged: PT [place] Agency: [facility] [employer] [facility] [employer] Date [place]: 10/20/18 Time [place]: 1145 Representative spoke with at [place]: [place] Arrangements/Services Living arrangements for the past 2 months: [place] Lives with:: Spouse Patient language and need for interpreter reviewed:: No Do you feel safe going back to the place where you live?: Yes      Need for Family Participation in Patient Care: No (Comment) Care giver support system in place?: Yes (comment) Current  home services: DME(rw) Criminal Activity/Legal Involvement Pertinent to Current Situation/Hospitalization: No - Comment as needed  Activities of Daily Living Home Assistive Devices/Equipment: None ADL Screening (condition at time of admission) Patient's cognitive ability adequate to safely complete daily activities?: Yes Is the patient deaf or have difficulty hearing?: No Does the patient have difficulty seeing, even when wearing glasses/contacts?: No Does the patient have difficulty concentrating, remembering, or making decisions?: No Patient able to express need for assistance with ADLs?: Yes Does the patient have difficulty dressing or bathing?: No Independently performs ADLs?: Yes (appropriate for developmental age) Does the patient have difficulty walking or climbing stairs?: No Weakness of Legs: Both Weakness of Arms/Hands: None  Permission Sought/Granted Permission sought to share information with : Case Manager Permission granted to share information with : Yes, Verbal Permission Granted              Emotional Assessment Appearance:: Appears stated age Attitude/Demeanor/Rapport: Engaged Affect (typically observed): Accepting Orientation: : Oriented to Self, Oriented to Place, Oriented to  Time, Oriented to Situation Alcohol / Substance Use: Never Used Psych Involvement: No (comment)  Admission diagnosis:  Left hip pain [M25.552] Sepsis ([place]) [A41.9] Patient Active Problem List   Diagnosis Date Noted  . Infection of right prepatellar bursa 10/18/2018  . Sepsis ([place]) 10/18/2018  . Chest congestion 07/20/2017  . Viral illness  07/20/2017  . Lower respiratory infection 07/12/2017  . Laryngitis, acute 07/12/2017  . Cough 07/12/2017  . Pleurisy 07/12/2017  . Depression 06/03/2015  . HTN (hypertension) 01/02/2015  . Hyperlipidemia 01/02/2015  . 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 ([place]) 12/23/2014  . Sleep apnea 09/21/2010  . Arthritis,  degenerative 02/17/2009  . Hereditary and idiopathic peripheral neuropathy 11/21/2008  . AD (atopic dermatitis) 07/08/2008  . Acid reflux 04/05/2007   PCP:  Horald Pollen, MD Pharmacy:   [id number] [place] [id number]: [phone]: [phone]    Social Determinants of Health (SDOH) Interventions    Readmission Risk Interventions No flowsheet data found.

## 2018-10-20 NOTE — Progress Notes (Signed)
[facility] NAME: Nathan Dean    MR#:  [id number]  DATE OF BIRTH:  29-Nov-1955  SUBJECTIVE:   Pt. Fell yesterday again.  No fever this a.m. Denies any worsening shortness of breath.    REVIEW OF SYSTEMS:    Review of Systems  Constitutional: Negative for chills and fever.  HENT: Negative for congestion and tinnitus.   Eyes: Negative for blurred vision and double vision.  Respiratory: Negative for cough, shortness of breath and wheezing.   Cardiovascular: Negative for chest pain, orthopnea and PND.  Gastrointestinal: Negative for abdominal pain, diarrhea, nausea and vomiting.  Genitourinary: Negative for dysuria and hematuria.  Musculoskeletal: Positive for falls.  Neurological: Negative for dizziness, sensory change and focal weakness.  All other systems reviewed and are negative.   Nutrition: Heart Healthy/Carb modified.  Tolerating Diet: Yes Tolerating PT: Eval noted.   DRUG ALLERGIES:   Allergies  Allergen Reactions   Penicillins Anaphylaxis, Rash and Other (See Comments)   Succinylcholine Chloride Anaphylaxis and Rash   Keflex [Cephalexin] Rash   Sulfa Antibiotics Rash and Other (See Comments)    VITALS:  Blood pressure 138/64, pulse 74, temperature 97.7 F (36.5 C), temperature source Oral, resp. rate 16, height 6' 9"  (2.057 m), weight (!) 167.8 kg, SpO2 90 %.  PHYSICAL EXAMINATION:   Physical Exam  GENERAL:  63 y.o.-year-old obese patient lying in bed in no acute distress.  EYES: Pupils equal, round, reactive to light and accommodation. No scleral icterus. Extraocular muscles intact.  HEENT: Head atraumatic, normocephalic. Oropharynx and nasopharynx clear.  NECK:  Supple, no jugular venous distention. No thyroid enlargement, no tenderness.  LUNGS: Normal breath sounds bilaterally, no wheezing, rales, rhonchi. No use of accessory muscles of respiration.  CARDIOVASCULAR: S1, S2 normal. No murmurs,  rubs, or gallops.  ABDOMEN: Soft, nontender, nondistended. Bowel sounds present. No organomegaly or mass.  EXTREMITIES: No cyanosis, clubbing or edema b/l.  Right knee swelling with abrasion noted.    NEUROLOGIC: Cranial nerves II through XII are intact. No focal Motor or sensory deficits b/l.   PSYCHIATRIC: The patient is alert and oriented x 3.  SKIN: No obvious rash, lesion, or ulcer.    LABORATORY PANEL:   CBC Recent Labs  Lab 10/20/18 0234  WBC 14.2*  HGB 11.4*  HCT 36.7*  PLT 168   ------------------------------------------------------------------------------------------------------------------  Chemistries  Recent Labs  Lab 10/18/18 0937  10/20/18 0234  NA 136   < > 140  K 3.7   < > 3.1*  CL 101   < > 106  CO2 25   < > 26  GLUCOSE 163*   < > 96  BUN 14   < > 22  CREATININE 0.57*   < > 0.76  CALCIUM 8.7*   < > 8.0*  AST 26  --   --   ALT 17  --   --   ALKPHOS 63  --   --   BILITOT 1.1  --   --    < > = values in this interval not displayed.   ------------------------------------------------------------------------------------------------------------------  Cardiac Enzymes No results for input(s): TROPONINI in the last 168 hours. ------------------------------------------------------------------------------------------------------------------  RADIOLOGY:  No results found.   ASSESSMENT AND PLAN:   63 year old male with past medical history of diabetes, hypertension, obstructive sleep apnea, hyperlipidemia, GERD who presented to the hospital after a fall and also noted to be in acute respiratory failure with hypoxia.  1.  Sepsis-patient  met criteria on admission given fever, leukocytosis and chest x-ray findings suggestive of suspected pneumonia -Patient's MRSA PCR is negative and will DC vancomycin.  Continue aztreonam and Flagyl. -Cultures remain negative.  Patient did spike a fever yesterday but is afebrile today.  2.  Acute respiratory failure with  hypoxia- secondary to underlying COPD with superimposed pneumonia. - Patient CT chest shows no acute pulmonary embolism but groundglass opacities and also lingular and right lower lobe pneumonia. - Continue O2 supplementation, continue IV aztreonam and Flagyl.  Assess the patient for home oxygen prior to discharge.  3.  Pneumonia-source of patient's sepsis/hypoxia as mentioned. - cont. Aztreonam, Flagyl. Cultures are (-). Afebrile today. WBC count slightly elevated but could be steroid mediated.   4.  Recurrent falls/weakness - appreciate PT eval and will need Home health PT.   5.  Right knee pain swelling redness- orthopedic consult obtained. -Seen by Dr. Marry Guan who does not think that the patient needs any arthrocentesis but continue IV antibiotics and supportive care for now..  6.  Obstructive sleep apnea-continue CPAP.  7.  Essential hypertension-continue Norvasc, carvedilol, losartan, HCTZ.  8.  Diabetes type 2 with underlying neuropathy- continue sliding scale insulin, insulin Decludec.  - carb control diet. BS stable.   9. Diabetic neuropathy - cont. Gabapentin.    10. Hyperlipidemia - cont. Crestor. \  11. Hypokalemia - will give oral potassium and repeat in a.m.   Possible d/c home tomorrow with Home Health tomorrow if remains afebrile.   All the records are reviewed and case discussed with Care Management/Social Worker. Management plans discussed with the patient, family and they are in agreement.  CODE STATUS: Full code  DVT Prophylaxis: Lovenox  TOTAL TIME TAKING CARE OF THIS PATIENT: 30 minutes.   POSSIBLE D/C IN 1-2 DAYS, DEPENDING ON CLINICAL CONDITION.   Henreitta Leber M.D on 10/20/2018 at 3:37 PM  Between 7am to 6pm - Pager - [phone]  After 6pm go to [email or web address] - [employer] [facility]  Office  [phone]  CC: Primary care physician; Horald Pollen, MD

## 2018-10-20 NOTE — Progress Notes (Signed)
Physical Therapy Treatment Patient Details Name: Nathan Dean MRN: [id number] DOB: Oct 22, 1955 Today's Date: 10/20/2018    History of Present Illness From MD H&P 10/18/18:  Pt is a 63 y.o. male with a known history of morbid obesity, obstructive sleep apnea, hypertension hyperlipidemia GERD, and diabetes who presents to the hospital after a fall.  Patient says he was in his usual state of health and this past Saturday he fell at his daughter's house and had a small abrasion to his right knee where he had previous right knee replacement.  He had another fall today and was unable to get up from the floor and therefore came to the ER for further evaluation.  In the ER patient was noted to be in acute respiratory failure with hypoxia with room air O2 sats in the mid 80s, he was also febrile, tachycardic and therefore met sepsis criteria.  Patient underwent a CT of his chest which showed groundglass opacities and nonspecific findings suggestive of possible atypical pneumonia.  Patient denies any worsening shortness of breath, cough, congestion, chills, nausea vomiting or any other associated symptoms.  Hospitalist services were contacted for admission.  Assessment includes: Sepsis, Acute respiratory failure with hypoxia, PNA, recurrent falls/weakness, R knee pain/swelling/redness, OSA, HTN, DM II, diabetic neuropathy, and HLD.      PT Comments    Pt still needs elevated surface to rise, but after some A/ROM in bed of RLE,  Pain is improved, and with RW is able to STS with supervision. AMB tolerated up to 243f without instability, still some soreness in the knee with AMB, but pt reports it not heavily limited. Pt progressing well in general, will need to use RW upon return to home.   Follow Up Recommendations  Home health PT     Equipment Recommendations  None recommended by PT    Recommendations for Other Services       Precautions / Restrictions Precautions Precautions: Fall Restrictions Weight  Bearing Restrictions: No    Mobility  Bed Mobility Overal bed mobility: Modified Independent                Transfers Overall transfer level: Needs assistance Equipment used: Rolling walker (2 wheeled) Transfers: Sit to/from Stand Sit to Stand: Supervision         General transfer comment: Good eccentric and concentric control; 5x from EOB  Ambulation/Gait Ambulation/Gait assistance: Min guard Gait Distance (Feet): 200 Feet Assistive device: Rolling walker (2 wheeled) Gait Pattern/deviations: Step-through pattern;Decreased step length - right;Decreased step length - left         Stairs             Wheelchair Mobility    Modified Rankin (Stroke Patients Only)       Balance Overall balance assessment: Mild deficits observed, not formally tested;History of Falls                                          Cognition Arousal/Alertness: Awake/alert Behavior During Therapy: WFL for tasks assessed/performed Overall Cognitive Status: Within Functional Limits for tasks assessed                                        Exercises General Exercises - Lower Extremity Short Arc Quad: AROM;15 reps;Supine Heel Slides: AAROM;Right;15 reps;Supine Hip ABduction/ADduction: AROM;Supine;Right;15 reps  General Comments        Pertinent Vitals/Pain Pain Assessment: 0-10 Pain Score: 4  Pain Location: R knee  Pain Descriptors / Indicators: Sore Pain Intervention(s): Limited activity within patient's tolerance;Monitored during session    Home Living                      Prior Function            PT Goals (current goals can now be found in the care plan section) Acute Rehab PT Goals Patient Stated Goal: To get stronger PT Goal Formulation: With patient Time For Goal Achievement: 11/01/18 Potential to Achieve Goals: Good Progress towards PT goals: Progressing toward goals    Frequency    Min 2X/week      PT  Plan Current plan remains appropriate    Co-evaluation              AM-PAC PT "6 Clicks" Mobility   Outcome Measure  Help needed turning from your back to your side while in a flat bed without using bedrails?: None Help needed moving from lying on your back to sitting on the side of a flat bed without using bedrails?: None Help needed moving to and from a bed to a chair (including a wheelchair)?: A Little Help needed standing up from a chair using your arms (e.g., wheelchair or bedside chair)?: A Little Help needed to walk in hospital room?: A Little Help needed climbing 3-5 steps with a railing? : A Little 6 Click Score: 20    End of Session Equipment Utilized During Treatment: Gait belt;Oxygen Activity Tolerance: Patient limited by pain Patient left: in bed;with bed alarm set;with call bell/phone within reach Nurse Communication: Mobility status PT Visit Diagnosis: Unsteadiness on feet (R26.81);History of falling (Z91.81);Muscle weakness (generalized) (M62.81);Difficulty in walking, not elsewhere classified (R26.2);Pain Pain - Right/Left: Right Pain - part of body: Knee     Time: 8088-1103 PT Time Calculation (min) (ACUTE ONLY): 34 min  Charges:  $Therapeutic Exercise: 23-37 mins               12:55 PM, 10/20/18 Etta Grandchild, PT, DPT Physical Therapist - [facility]  [phone] ([place])    Yuma Pacella C 10/20/2018, 12:53 PM

## 2018-10-20 NOTE — Progress Notes (Signed)
Inpatient Diabetes Program Recommendations  AACE/ADA: New Consensus Statement on Inpatient Glycemic Control   Target Ranges:  Prepandial:   less than 140 mg/dL      Peak postprandial:   less than 180 mg/dL (1-2 hours)      Critically ill patients:  140 - 180 mg/dL   Results for Nathan Dean, Nathan Dean (MRN [id number]) as of 10/20/2018 11:08  Ref. Range 10/19/2018 07:47 10/19/2018 11:33 10/19/2018 16:42 10/19/2018 20:59 10/20/2018 07:51  Glucose-Capillary Latest Ref Range: 70 - 99 mg/dL 86 676 (H) 720 (H) 947 (H) 67 (L)   Review of Glycemic Control  Diabetes history: DM2 Outpatient Diabetes medications: Tresiba 80 units QHS, Ozempic 1 mg Qweek, Synjardy 09-998 mg BID Current orders for Inpatient glycemic control: Lantus 80 units QHS, Novolog 0-20 units TID with meals, Novolog 0-5 units QHS; Prednisone 50 mg QAM  Inpatient Diabetes Program Recommendations:   Insulin - Basal: Please consider decreasing Lantus to 75 units QHS.  Thanks, Orlando Penner, RN, MSN, CDE Diabetes Coordinator Inpatient Diabetes Program [phone] (Team Pager from 8am to 5pm)

## 2018-10-21 ENCOUNTER — Inpatient Hospital Stay
Admission: EM | Admit: 2018-10-21 | Discharge: 2018-10-26 | DRG: 193 | Disposition: A | Discharge status: Skilled Nursing Facility | Payer: Managed Care, Other (non HMO) | Attending: Internal Medicine | Admitting: Internal Medicine

## 2018-10-21 ENCOUNTER — Encounter: Payer: Self-pay | Admitting: Emergency Medicine

## 2018-10-21 ENCOUNTER — Emergency Department: Payer: Managed Care, Other (non HMO)

## 2018-10-21 ENCOUNTER — Other Ambulatory Visit: Payer: Self-pay | Admitting: Emergency Medicine

## 2018-10-21 ENCOUNTER — Other Ambulatory Visit: Payer: Self-pay

## 2018-10-21 DIAGNOSIS — R0602 Shortness of breath: Secondary | ICD-10-CM | POA: Diagnosis not present

## 2018-10-21 DIAGNOSIS — Z888 Allergy status to other drugs, medicaments and biological substances status: Secondary | ICD-10-CM

## 2018-10-21 DIAGNOSIS — E662 Morbid (severe) obesity with alveolar hypoventilation: Secondary | ICD-10-CM | POA: Diagnosis present

## 2018-10-21 DIAGNOSIS — S8002XA Contusion of left knee, initial encounter: Secondary | ICD-10-CM | POA: Diagnosis present

## 2018-10-21 DIAGNOSIS — Z6839 Body mass index (BMI) 39.0-39.9, adult: Secondary | ICD-10-CM

## 2018-10-21 DIAGNOSIS — J9601 Acute respiratory failure with hypoxia: Secondary | ICD-10-CM | POA: Diagnosis present

## 2018-10-21 DIAGNOSIS — R531 Weakness: Secondary | ICD-10-CM

## 2018-10-21 DIAGNOSIS — K219 Gastro-esophageal reflux disease without esophagitis: Secondary | ICD-10-CM | POA: Diagnosis present

## 2018-10-21 DIAGNOSIS — I10 Essential (primary) hypertension: Secondary | ICD-10-CM

## 2018-10-21 DIAGNOSIS — E785 Hyperlipidemia, unspecified: Secondary | ICD-10-CM | POA: Diagnosis present

## 2018-10-21 DIAGNOSIS — S80211A Abrasion, right knee, initial encounter: Secondary | ICD-10-CM | POA: Diagnosis present

## 2018-10-21 DIAGNOSIS — G8929 Other chronic pain: Secondary | ICD-10-CM

## 2018-10-21 DIAGNOSIS — R0902 Hypoxemia: Secondary | ICD-10-CM

## 2018-10-21 DIAGNOSIS — F32A Depression, unspecified: Secondary | ICD-10-CM | POA: Diagnosis present

## 2018-10-21 DIAGNOSIS — I110 Hypertensive heart disease with heart failure: Secondary | ICD-10-CM | POA: Diagnosis present

## 2018-10-21 DIAGNOSIS — M25561 Pain in right knee: Secondary | ICD-10-CM

## 2018-10-21 DIAGNOSIS — J189 Pneumonia, unspecified organism: Secondary | ICD-10-CM | POA: Diagnosis not present

## 2018-10-21 DIAGNOSIS — Z882 Allergy status to sulfonamides status: Secondary | ICD-10-CM

## 2018-10-21 DIAGNOSIS — F329 Major depressive disorder, single episode, unspecified: Secondary | ICD-10-CM | POA: Diagnosis present

## 2018-10-21 DIAGNOSIS — Z79899 Other long term (current) drug therapy: Secondary | ICD-10-CM

## 2018-10-21 DIAGNOSIS — I5033 Acute on chronic diastolic (congestive) heart failure: Secondary | ICD-10-CM | POA: Diagnosis present

## 2018-10-21 DIAGNOSIS — L03115 Cellulitis of right lower limb: Secondary | ICD-10-CM | POA: Diagnosis present

## 2018-10-21 DIAGNOSIS — Z20828 Contact with and (suspected) exposure to other viral communicable diseases: Secondary | ICD-10-CM | POA: Diagnosis present

## 2018-10-21 DIAGNOSIS — Y92003 Bedroom of unspecified non-institutional (private) residence as the place of occurrence of the external cause: Secondary | ICD-10-CM

## 2018-10-21 DIAGNOSIS — Z96653 Presence of artificial knee joint, bilateral: Secondary | ICD-10-CM | POA: Diagnosis present

## 2018-10-21 DIAGNOSIS — E1165 Type 2 diabetes mellitus with hyperglycemia: Secondary | ICD-10-CM | POA: Diagnosis present

## 2018-10-21 DIAGNOSIS — Z87891 Personal history of nicotine dependence: Secondary | ICD-10-CM

## 2018-10-21 DIAGNOSIS — Z794 Long term (current) use of insulin: Secondary | ICD-10-CM

## 2018-10-21 DIAGNOSIS — R296 Repeated falls: Secondary | ICD-10-CM | POA: Diagnosis present

## 2018-10-21 DIAGNOSIS — Z881 Allergy status to other antibiotic agents status: Secondary | ICD-10-CM

## 2018-10-21 DIAGNOSIS — S8001XA Contusion of right knee, initial encounter: Secondary | ICD-10-CM | POA: Diagnosis present

## 2018-10-21 DIAGNOSIS — Z7982 Long term (current) use of aspirin: Secondary | ICD-10-CM

## 2018-10-21 DIAGNOSIS — W06XXXA Fall from bed, initial encounter: Secondary | ICD-10-CM | POA: Diagnosis present

## 2018-10-21 DIAGNOSIS — Z880 Allergy status to penicillin: Secondary | ICD-10-CM

## 2018-10-21 DIAGNOSIS — E1142 Type 2 diabetes mellitus with diabetic polyneuropathy: Secondary | ICD-10-CM | POA: Diagnosis present

## 2018-10-21 LAB — BASIC METABOLIC PANEL
Anion gap: 9 (ref 5–15)
BUN: 19 mg/dL (ref 8–23)
CO2: 28 mmol/L (ref 22–32)
Calcium: 8.6 mg/dL — ABNORMAL LOW (ref 8.9–10.3)
Chloride: 106 mmol/L (ref 98–111)
Creatinine, Ser: 0.73 mg/dL (ref 0.61–1.24)
GFR calc Af Amer: >60 mL/min (ref >60)
GFR calc non Af Amer: >60 mL/min (ref >60)
Glucose, Bld: 92 mg/dL (ref 70–99)
Potassium: 4.3 mmol/L (ref 3.5–5.1)
Sodium: 143 mmol/L (ref 135–145)

## 2018-10-21 LAB — COMPREHENSIVE METABOLIC PANEL
ALT: 14 U/L (ref 0–44)
AST: 26 U/L (ref 15–41)
Albumin: 3 g/dL — ABNORMAL LOW (ref 3.5–5.0)
Alkaline Phosphatase: 57 U/L (ref 38–126)
Anion gap: 9 (ref 5–15)
BUN: 19 mg/dL (ref 8–23)
CO2: 27 mmol/L (ref 22–32)
Calcium: 8.4 mg/dL — ABNORMAL LOW (ref 8.9–10.3)
Chloride: 102 mmol/L (ref 98–111)
Creatinine, Ser: 0.82 mg/dL (ref 0.61–1.24)
GFR calc Af Amer: >60 mL/min (ref >60)
GFR calc non Af Amer: >60 mL/min (ref >60)
Glucose, Bld: 172 mg/dL — ABNORMAL HIGH (ref 70–99)
Potassium: 3.6 mmol/L (ref 3.5–5.1)
Sodium: 138 mmol/L (ref 135–145)
Total Bilirubin: 0.6 mg/dL (ref 0.3–1.2)
Total Protein: 6.7 g/dL (ref 6.5–8.1)

## 2018-10-21 LAB — CBC WITH DIFFERENTIAL/PLATELET
Abs Immature Granulocytes: 0.04 10*3/uL (ref 0.00–0.07)
Basophils Absolute: 0.1 10*3/uL (ref 0.0–0.1)
Basophils Relative: 1 %
Eosinophils Absolute: 0.7 10*3/uL — ABNORMAL HIGH (ref 0.0–0.5)
Eosinophils Relative: 6 %
HCT: 38.3 % — ABNORMAL LOW (ref 39.0–52.0)
Hemoglobin: 11.9 g/dL — ABNORMAL LOW (ref 13.0–17.0)
Immature Granulocytes: 0 %
Lymphocytes Relative: 11 %
Lymphs Abs: 1.2 10*3/uL (ref 0.7–4.0)
MCH: 27.7 pg (ref 26.0–34.0)
MCHC: 31.1 g/dL (ref 30.0–36.0)
MCV: 89.3 fL (ref 80.0–100.0)
Monocytes Absolute: 1 10*3/uL (ref 0.1–1.0)
Monocytes Relative: 10 %
Neutro Abs: 7.7 10*3/uL (ref 1.7–7.7)
Neutrophils Relative %: 72 %
Platelets: 205 10*3/uL (ref 150–400)
RBC: 4.29 MIL/uL (ref 4.22–5.81)
RDW: 14.5 % (ref 11.5–15.5)
WBC: 10.7 10*3/uL — ABNORMAL HIGH (ref 4.0–10.5)
nRBC: 0 % (ref 0.0–0.2)

## 2018-10-21 LAB — CBC
HCT: 37.4 % — ABNORMAL LOW (ref 39.0–52.0)
Hemoglobin: 11.7 g/dL — ABNORMAL LOW (ref 13.0–17.0)
MCH: 28 pg (ref 26.0–34.0)
MCHC: 31.3 g/dL (ref 30.0–36.0)
MCV: 89.5 fL (ref 80.0–100.0)
Platelets: 191 10*3/uL (ref 150–400)
RBC: 4.18 MIL/uL — ABNORMAL LOW (ref 4.22–5.81)
RDW: 14.4 % (ref 11.5–15.5)
WBC: 11.3 10*3/uL — ABNORMAL HIGH (ref 4.0–10.5)
nRBC: 0 % (ref 0.0–0.2)

## 2018-10-21 LAB — LACTIC ACID, PLASMA: Lactic Acid, Venous: 1.4 mmol/L (ref 0.5–1.9)

## 2018-10-21 LAB — GLUCOSE, CAPILLARY: Glucose-Capillary: 96 mg/dL (ref 70–99)

## 2018-10-21 LAB — TROPONIN I: Troponin I: <0.03 ng/mL (ref <0.03)

## 2018-10-21 LAB — PROTIME-INR
INR: 1 (ref 0.8–1.2)
Prothrombin Time: 13.5 seconds (ref 11.4–15.2)

## 2018-10-21 LAB — MAGNESIUM: Magnesium: 2.3 mg/dL (ref 1.7–2.4)

## 2018-10-21 MED ORDER — GABAPENTIN 300 MG PO CAPS: 900.0000 mg | ORAL_CAPSULE | Freq: Three times a day (TID) | ORAL | Status: DC

## 2018-10-21 MED ORDER — DOXYCYCLINE HYCLATE 100 MG PO TABS: 100.0000 mg | ORAL_TABLET | Freq: Two times a day (BID) | ORAL | 0 refills | Status: DC

## 2018-10-21 MED ORDER — HYDROCODONE-ACETAMINOPHEN 5-325 MG PO TABS: 1.0000 | ORAL_TABLET | ORAL | 0 refills | Status: DC | PRN

## 2018-10-21 MED ORDER — SODIUM CHLORIDE 0.9% FLUSH
3.0000 mL | Freq: Once | INTRAVENOUS | Status: AC
  Administered 2018-10-23: 21:00:00 3 mL via INTRAVENOUS

## 2018-10-21 MED ORDER — LEVOFLOXACIN IN D5W 750 MG/150ML IV SOLN
750.0000 mg | Freq: Once | INTRAVENOUS | Status: AC
  Administered 2018-10-22: 750 mg via INTRAVENOUS
  Filled 2018-10-21: qty 150

## 2018-10-21 MED ORDER — DOXYCYCLINE HYCLATE 100 MG PO TABS
100.0000 mg | ORAL_TABLET | Freq: Two times a day (BID) | ORAL | Status: DC
  Administered 2018-10-21: 09:00:00 100 mg via ORAL
  Filled 2018-10-21: qty 1

## 2018-10-21 NOTE — Telephone Encounter (Signed)
Courtesy refill given pt needs OV Requested Prescriptions  Pending Prescriptions Disp Refills  . carvedilol (COREG) 6.25 MG tablet [Pharmacy Med Name: CARVEDILOL 6.25 MG TABLET] 60 tablet 0    Sig: TAKE 1 TABLET BY MOUTH TWICE A DAY WITH FOOD     Cardiovascular:  Beta Blockers Failed - 10/21/2018  9:09 AM      Failed - Last BP in normal range    BP Readings from Last 1 Encounters:  10/21/18 (!) 142/72         Failed - Valid encounter within last 6 months    Recent Outpatient Visits          4 months ago Acute non-recurrent maxillary sinusitis   Primary [facility] at [facility], Ines Bloomer, MD   6 months ago Dermatitis   Primary Care at [facility], Arlie Solomons, MD   7 months ago Paronychia of finger of right hand   Primary Care at [facility], Arlie Solomons, MD   7 months ago Paronychia of finger of right hand   Primary Care at [facility], Arlie Solomons, MD   7 months ago Pain in both hands   Primary Care at [place], [place], MD             Passed - Last Heart Rate in normal range    Pulse Readings from Last 1 Encounters:  10/21/18 73

## 2018-10-21 NOTE — Progress Notes (Signed)
Discharge process complete. Patient has no further questions or concerns. Pain is well under control. Vital signs stable. PIVs removed and personal items gathered and taken with patient.   Wheelchair escort off unit by staff member. Patient is off unit. Care relinquished.

## 2018-10-21 NOTE — Discharge Summary (Signed)
[facility] NAME: Nathan Dean    MR#:  [id number]  DATE OF BIRTH:  Dec 28, 1955  DATE OF ADMISSION:  10/18/2018 ADMITTING PHYSICIAN: Henreitta Leber, MD  DATE OF DISCHARGE: 10/21/2018 12:10 PM  PRIMARY CARE PHYSICIAN: Dr. Reginia Forts   ADMISSION DIAGNOSIS:  Left hip pain [M25.552] Sepsis ([place]) [A41.9]  DISCHARGE DIAGNOSIS:  Active Problems:   Infection of right prepatellar bursa   Sepsis ([place])   SECONDARY DIAGNOSIS:   Past Medical History:  Diagnosis Date  . Diabetes ([place])   . GERD (gastroesophageal reflux disease)   . Hyperlipidemia   . Hypertension   . Sleep apnea     HOSPITAL COURSE:   1.  Clinical sepsis, pneumonia.  Patient was given vancomycin aztreonam and Flagyl while here in the hospital.  Will switch over to doxycycline upon going home. 2.  Acute hypoxic respiratory failure.  CT negative for pulmonary embolism but did show groundglass opacities.  Switch antibiotics over to doxycycline upon going home which would also cover atypicals.  Patient tapered off oxygen during the hospital course 3.  Recurrent falls and weakness.  Physical therapy recommended home with home health 4.  Right knee pain and swelling.  Patient seen by orthopedic surgery and follow-up needed.  Antibiotics would cover this also. 5.  Sleep apnea on CPAP 6.  Hypertension continue usual medications 7.  Type 2 diabetes with neuropathy.  Continue his usual regimen 8.  Hyperlipidemia unspecified on Crestor   DISCHARGE CONDITIONS:  Satisfactory  CONSULTS OBTAINED:  Treatment Team:  Hessie Knows, MD  DRUG ALLERGIES:   Allergies  Allergen Reactions  . Penicillins Anaphylaxis, Rash and Other (See Comments)  . Succinylcholine Chloride Anaphylaxis and Rash  . Keflex [Cephalexin] Rash  . Sulfa Antibiotics Rash and Other (See Comments)    DISCHARGE MEDICATIONS:   Allergies as of 10/21/2018      Reactions   Penicillins Anaphylaxis, Rash,  Other (See Comments)   Succinylcholine Chloride Anaphylaxis, Rash   Keflex [cephalexin] Rash   Sulfa Antibiotics Rash, Other (See Comments)      Medication List    TAKE these medications   amLODipine 10 MG tablet Commonly known as:  NORVASC Take 0.5 tablets (5 mg total) by mouth daily.   aspirin 81 MG tablet Take 1 tablet (81 mg total) by mouth daily.   carvedilol 6.25 MG tablet Commonly known as:  COREG TAKE 1 TABLET BY MOUTH TWICE A DAY WITH FOOD What changed:  when to take this   doxycycline 100 MG tablet Commonly known as:  VIBRA-TABS Take 1 tablet (100 mg total) by mouth every 12 (twelve) hours.   DULoxetine 30 MG capsule Commonly known as:  CYMBALTA Take 30 mg by mouth daily.   gabapentin 300 MG capsule Commonly known as:  NEURONTIN Take 3 capsules (900 mg total) by mouth 3 (three) times daily. What changed:  See the new instructions.   glucose blood test strip 1 each by Other route 4 (four) times daily. Use with One touch meter to check blood sugar. Dx E11.9   hydrochlorothiazide 12.5 MG tablet Commonly known as:  HYDRODIURIL TAKE 1 TABLET BY MOUTH EVERY DAY   HYDROcodone-acetaminophen 5-325 MG tablet Commonly known as:  NORCO/VICODIN Take 1-2 tablets by mouth every 4 (four) hours as needed for moderate pain or severe pain.   Insulin Degludec 200 UNIT/ML Sopn Inject 80 Units into the skin at bedtime.   Insulin Pen Needle 32G X 4 MM  Misc Commonly known as:  BD Pen Needle Nano U/F Inject 1 each as directed 3 (three) times daily.   losartan 100 MG tablet Commonly known as:  COZAAR TAKE 1 TABLET BY MOUTH EVERY DAY   Melatonin 10 MG Tabs Take 10 mg by mouth at bedtime as needed (sleep).   multivitamin with minerals Tabs tablet Take 1 tablet by mouth daily.   omeprazole 20 MG capsule Commonly known as:  PRILOSEC Take 20 mg by mouth daily.   ONE TOUCH LANCETS Misc 1 each by Does not apply route 4 (four) times daily. Use with one touch meter to check  blood sugar. Dx: E11.9   ONE TOUCH ULTRA SYSTEM KIT w/Device Kit 1 kit by Does not apply route once. Dx. E11.9   OZEMPIC (1 MG/DOSE) [place] Inject 1 mg into the skin once a week.   rosuvastatin 20 MG tablet Commonly known as:  CRESTOR Take 20 mg by mouth daily.   Synjardy 09-998 MG Tabs Generic drug:  Empagliflozin-metFORMIN HCl Take 1 tablet by mouth 2 (two) times daily with a meal.        DISCHARGE INSTRUCTIONS:    Follow-up PMD 5 days Follow-up orthopedic surgery 1 week.  If you experience worsening of your admission symptoms, develop shortness of breath, life threatening emergency, suicidal or homicidal thoughts you must seek medical attention immediately by calling 911 or calling your MD immediately  if symptoms less severe.  You Must read complete instructions/literature along with all the possible adverse reactions/side effects for all the Medicines you take and that have been prescribed to you. Take any new Medicines after you have completely understood and accept all the possible adverse reactions/side effects.   Please note  You were cared for by a hospitalist during your hospital stay. If you have any questions about your discharge medications or the care you received while you were in the hospital after you are discharged, you can call the unit and asked to speak with the hospitalist on call if the hospitalist that took care of you is not available. Once you are discharged, your primary care physician will handle any further medical issues. Please note that NO REFILLS for any discharge medications will be authorized once you are discharged, as it is imperative that you return to your primary care physician (or establish a relationship with a primary care physician if you do not have one) for your aftercare needs so that they can reassess your need for medications and monitor your lab values.    Today   CHIEF COMPLAINT:   Chief Complaint  Patient presents with  . Fall     HISTORY OF PRESENT ILLNESS:  Jerel Sardina  is a 63 y.o. male presented after a fall and found to be hypoxic   VITAL SIGNS:  Blood pressure (!) 142/72, pulse 73, temperature 98.4 F (36.9 C), temperature source Oral, resp. rate 18, height 6' 9"  (2.057 m), weight (!) 167.8 kg, SpO2 100 %.    PHYSICAL EXAMINATION:  GENERAL:  63 y.o.-year-old patient lying in the bed with no acute distress.  EYES: Pupils equal, round, reactive to light and accommodation. No scleral icterus. Extraocular muscles intact.  HEENT: Head atraumatic, normocephalic. Oropharynx and nasopharynx clear.  NECK:  Supple, no jugular venous distention. No thyroid enlargement, no tenderness.  LUNGS: Normal breath sounds bilaterally, no wheezing, rales,rhonchi or crepitation. No use of accessory muscles of respiration.  CARDIOVASCULAR: S1, S2 normal. No murmurs, rubs, or gallops.  ABDOMEN: Soft, non-tender, non-distended. Bowel  sounds present. No organomegaly or mass.  EXTREMITIES: 2+ pedal edema, no cyanosis, or clubbing.  NEUROLOGIC: Cranial nerves II through XII are intact. Muscle strength 5/5 in all extremities. Sensation intact. Gait not checked.  PSYCHIATRIC: The patient is alert and oriented x 3.  SKIN: Right knee to skin tears and swelling and slight erythema likely secondary to bruising  DATA REVIEW:   CBC Recent Labs  Lab 10/21/18 0523  WBC 11.3*  HGB 11.7*  HCT 37.4*  PLT 191    Chemistries  Recent Labs  Lab 10/18/18 0937  10/21/18 0523  NA 136   < > 143  K 3.7   < > 4.3  CL 101   < > 106  CO2 25   < > 28  GLUCOSE 163*   < > 92  BUN 14   < > 19  CREATININE 0.57*   < > 0.73  CALCIUM 8.7*   < > 8.6*  MG  --   --  2.3  AST 26  --   --   ALT 17  --   --   ALKPHOS 63  --   --   BILITOT 1.1  --   --    < > = values in this interval not displayed.     Microbiology Results  Results for orders placed or performed during the hospital encounter of 10/18/18  Blood Culture (routine x 2)      Status: None (Preliminary result)   Collection Time: 10/18/18  9:37 AM  Result Value Ref Range Status   Specimen Description BLOOD BLOOD RIGHT FOREARM  Final   Special Requests   Final    BOTTLES DRAWN AEROBIC AND ANAEROBIC Blood Culture results may not be optimal due to an excessive volume of blood received in culture bottles   Culture   Final    NO GROWTH 3 DAYS Performed at [facility], [address] [place], [place] [address]    Report Status PENDING  Incomplete  Blood Culture (routine x 2)     Status: None (Preliminary result)   Collection Time: 10/18/18  9:37 AM  Result Value Ref Range Status   Specimen Description BLOOD BLOOD RIGHT WRIST  Final   Special Requests   Final    BOTTLES DRAWN AEROBIC AND ANAEROBIC Blood Culture results may not be optimal due to an excessive volume of blood received in culture bottles   Culture   Final    NO GROWTH 3 DAYS Performed at [facility], [address] [place], [place] [address]    Report Status PENDING  Incomplete  SARS Coronavirus 2 (CEPHEID- Performed in [place] hospital lab), Hosp Order     Status: None   Collection Time: 10/18/18  9:37 AM  Result Value Ref Range Status   SARS Coronavirus 2 NEGATIVE NEGATIVE Final    Comment: (NOTE) If result is NEGATIVE SARS-CoV-2 target nucleic acids are NOT DETECTED. The SARS-CoV-2 RNA is generally detectable in upper and lower  respiratory specimens during the acute phase of infection. The lowest  concentration of SARS-CoV-2 viral copies this assay can detect is 250  copies / mL. A negative result does not preclude SARS-CoV-2 infection  and should not be used as the sole basis for treatment or other  patient management decisions.  A negative result may occur with  improper specimen collection / handling, submission of specimen other  than nasopharyngeal swab, presence of viral mutation(s) within the  areas targeted by this assay,  and inadequate number of viral  copies  (<250 copies / mL). A negative result must be combined with clinical  observations, patient history, and epidemiological information. If result is POSITIVE SARS-CoV-2 target nucleic acids are DETECTED. The SARS-CoV-2 RNA is generally detectable in upper and lower  respiratory specimens dur ing the acute phase of infection.  Positive  results are indicative of active infection with SARS-CoV-2.  Clinical  correlation with patient history and other diagnostic information is  necessary to determine patient infection status.  Positive results do  not rule out bacterial infection or co-infection with other viruses. If result is PRESUMPTIVE POSTIVE SARS-CoV-2 nucleic acids MAY BE PRESENT.   A presumptive positive result was obtained on the submitted specimen  and confirmed on repeat testing.  While 2019 novel coronavirus  (SARS-CoV-2) nucleic acids may be present in the submitted sample  additional confirmatory testing may be necessary for epidemiological  and / or clinical management purposes  to differentiate between  SARS-CoV-2 and other Sarbecovirus currently known to infect humans.  If clinically indicated additional testing with an alternate test  methodology [phone]) is advised. The SARS-CoV-2 RNA is generally  detectable in upper and lower respiratory sp ecimens during the acute  phase of infection. The expected result is Negative. Fact Sheet for Patients:  StrictlyIdeas.no Fact Sheet for Healthcare Providers: BankingDealers.co.za This test is not yet approved or cleared by the [place] FDA and has been authorized for detection and/or diagnosis of SARS-CoV-2 by FDA under an Emergency Use Authorization (EUA).  This EUA will remain in effect (meaning this test can be used) for the duration of the COVID-19 declaration under Section 564(b)(1) of the Act, 21 U.S.C. section 360bbb-3(b)(1), unless the authorization is terminated  or revoked sooner. Performed at [facility], [address], [place], [place] [address]   Urine culture     Status: None   Collection Time: 10/18/18  1:15 PM  Result Value Ref Range Status   Specimen Description   Final    URINE, CLEAN CATCH Performed at [facility], [address], [place], [place] [address]    Special Requests NONE  Final   Culture   Final    NO GROWTH Performed at [facility], [place] [address] [place],  [address]    Report Status 10/19/2018 FINAL  Final  Respiratory Panel by PCR     Status: None   Collection Time: 10/18/18  1:16 PM  Result Value Ref Range Status   Adenovirus NOT DETECTED NOT DETECTED Final   Coronavirus 229E NOT DETECTED NOT DETECTED Final    Comment: (NOTE) The Coronavirus on the Respiratory Panel, DOES NOT test for the novel  Coronavirus (2019 nCoV)    Coronavirus HKU1 NOT DETECTED NOT DETECTED Final   Coronavirus NL63 NOT DETECTED NOT DETECTED Final   Coronavirus OC43 NOT DETECTED NOT DETECTED Final   Metapneumovirus NOT DETECTED NOT DETECTED Final   Rhinovirus / Enterovirus NOT DETECTED NOT DETECTED Final   Influenza A NOT DETECTED NOT DETECTED Final   Influenza B NOT DETECTED NOT DETECTED Final   Parainfluenza Virus 1 NOT DETECTED NOT DETECTED Final   Parainfluenza Virus 2 NOT DETECTED NOT DETECTED Final   Parainfluenza Virus 3 NOT DETECTED NOT DETECTED Final   Parainfluenza Virus 4 NOT DETECTED NOT DETECTED Final   Respiratory Syncytial Virus NOT DETECTED NOT DETECTED Final   Bordetella pertussis NOT DETECTED NOT DETECTED Final   Chlamydophila pneumoniae NOT DETECTED NOT DETECTED Final   Mycoplasma  pneumoniae NOT DETECTED NOT DETECTED Final    Comment: Performed at [facility], [place] [address] [place], [place] [address]  MRSA PCR Screening     Status: None   Collection Time: 10/19/18  2:44 PM  Result Value Ref Range Status   MRSA by PCR NEGATIVE NEGATIVE Final    Comment:        The  GeneXpert MRSA Assay (FDA approved for NASAL specimens only), is one component of a comprehensive MRSA colonization surveillance program. It is not intended to diagnose MRSA infection nor to guide or monitor treatment for MRSA infections. Performed at [facility], [address] [place], [place] [address]       Management plans discussed with the patient, family and they are in agreement.  CODE STATUS:     Code Status Orders  (From admission, onward)         Start     Ordered   10/18/18 1432  Full code  Continuous     10/18/18 1431        Code Status History    Date Active Date Inactive Code Status Order ID Comments User Context   10/18/2018 1215 10/18/2018 1431 Full Code [id number]  Hessie Knows, MD ED      TOTAL TIME TAKING CARE OF THIS PATIENT: 35 minutes.    Loletha Grayer M.D on 10/21/2018 at 1:39 PM  Between 7am to 6pm - Pager - [phone]  After 6pm go to [email or web address] - password [email or web address]  [employer] Office  [phone]  CC: Primary care physician; Dr Reginia Forts

## 2018-10-21 NOTE — ED Notes (Addendum)
Approx this time Dr Cinda Quest found pt on floor calling for help, att this RN was on the phone to pt's wife, Pamala Hurry, [phone], and had been tasked by pt's wife to figure out who "will pay for the EMS having to come to the house twice since you discharged him"  Pt wife relates pt fall this am since coming home from hospital that rescue had to come and help pt up, then after the 1200 pain meds pt sat on toilet and then "wouldn't even make the effort to stand", "he's been going down hill since his first fall, he's on the couch and he's not working and it's affecting his emotional well being - I need some counseling for him for this"  Wife reports I can't take care of him at home like this, I love and want him home but I we can't function like this - I want him to go to rehab after this to rebuild his strength."  Wife also relates hx of multiple falls, weakness, shortness of breath, confusion, urinary urgency, (what sounds like impaired self-esteem) and malaise  After extensive phone call this RN stayed at A side to relate info and inquiry charge for direction with pt's wife billing, when informed that my pt in C-pod is on the floor yelling

## 2018-10-21 NOTE — ED Triage Notes (Signed)
Patient presents to Emergency Department via [facility] from home with complaints of right knee swelling and pain - area appears red and feels warm, pt also c/o of SOB - wheeze heard.    Pt was discharged from hospital today for PNE admission.    Pt reports fall with injury to right knee 1.5 weeks ago.      History of DM, HTN

## 2018-10-21 NOTE — ED Notes (Signed)
Patient transported to CT 

## 2018-10-21 NOTE — Plan of Care (Signed)

## 2018-10-21 NOTE — ED Notes (Addendum)
Pt found on floor, assessed by Dr Cinda Quest, assessed by this RN, reports only pain at right knee, denies head strike, no pain on palpation to prominences, dressing to knee still intact, cut off, weeping serosanguinous   Pt lift by 8 person team to bed, pt appears to have urinated on self, pt reports hitting call bell and when no one responded, pt reports attempting to leave bed by sliding down at the foot then unable to maintain weight  Pt appears hesitant to discuss urinary urgency, pt reoriented to call bell light, urinal at bedside, pt underwear bagged and cleaned of urine, pt appears oriented and alert though not forth coming as wife about hx excepting falls

## 2018-10-21 NOTE — ED Provider Notes (Addendum)
[facility] Emergency Department Provider Note   ____________________________________________   First MD Initiated Contact with Patient 10/21/18 2153     (approximate)  I have reviewed the triage vital signs and the nursing notes.   HISTORY  Chief Complaint Leg Injury and Shortness of Breath    HPI Nathan Dean is a 63 y.o. male who was admitted to the hospital for multifocal pneumonia and discharged earlier today.  He also has pain and swelling in the right knee.  This was present in the hospital as well.  He is weak and unable to get around at home unable to manage himself at home and his wife cannot help him as he is 6 foot 9 and quite heavy.  Additionally he is hypoxic his O2 sats are ranging from 84 to 88%.  He is running a low-grade fever 99.3.  He was discharged on IV antibiotics but seems actually to be getting worse.  He fell here in the emergency room try to get to move so he could urinate.  Slipped out of bed.  He has no injuries from that.  Did not hit his head has no headache or neck pain chest pain belly pain or leg pain except the knee which is been hurting him.         Past Medical History:  Diagnosis Date  . Diabetes ([place])   . GERD (gastroesophageal reflux disease)   . Hyperlipidemia   . Hypertension   . Sleep apnea     Patient Active Problem List   Diagnosis Date Noted  . Infection of right prepatellar bursa 10/18/2018  . Sepsis ([place]) 10/18/2018  . Chest congestion 07/20/2017  . Viral illness 07/20/2017  . Lower respiratory infection 07/12/2017  . Laryngitis, acute 07/12/2017  . Cough 07/12/2017  . Pleurisy 07/12/2017  . Depression 06/03/2015  . HTN (hypertension) 01/02/2015  . Hyperlipidemia 01/02/2015  . 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) 12/23/2014  . Sleep apnea 09/21/2010  . Arthritis, degenerative 02/17/2009  . Hereditary and idiopathic peripheral neuropathy 11/21/2008  . AD  (atopic dermatitis) 07/08/2008  . Acid reflux 04/05/2007    Past Surgical History:  Procedure Laterality Date  . CARPAL TUNNEL RELEASE Right 12/28/2016   Procedure: RIGHT ULNAR AND MEDIAN NEUROPLASTY AT WRIST;  Surgeon: Milly Jakob, MD;  Location: [place];  Service: Orthopedics;  Laterality: Right;  . REPLACEMENT TOTAL KNEE BILATERAL  2014   left 2012 rt 2014    Prior to Admission medications   Medication Sig Start Date End Date Taking? Authorizing Provider  amLODipine (NORVASC) 10 MG tablet Take 0.5 tablets (5 mg total) by mouth daily. 11/15/17  Yes Sagardia, Ines Bloomer, MD  aspirin 81 MG tablet Take 1 tablet (81 mg total) by mouth daily. 08/11/16  Yes Karamalegos, Devonne Doughty, DO  Blood Glucose Monitoring Suppl (ONE TOUCH ULTRA SYSTEM KIT) w/Device KIT 1 kit by Does not apply route once. Dx. E11.9 07/28/15  Yes Arlis Porta., MD  carvedilol (COREG) 6.25 MG tablet TAKE 1 TABLET BY MOUTH TWICE A DAY WITH FOOD 10/21/18  Yes Sagardia, Ines Bloomer, MD  doxycycline (VIBRA-TABS) 100 MG tablet Take 1 tablet (100 mg total) by mouth every 12 (twelve) hours. 10/21/18  Yes Wieting, Richard, MD  DULoxetine (CYMBALTA) 30 MG capsule Take 30 mg by mouth daily. 07/24/18  Yes [provider]  gabapentin (NEURONTIN) 300 MG capsule Take 3 capsules (900 mg total) by mouth 3 (three)  times daily. 10/21/18  Yes Wieting, Richard, MD  glucose blood test strip 1 each by Other route 4 (four) times daily. Use with One touch meter to check blood sugar. Dx E11.9 09/13/16  Yes Mikey College, NP  hydrochlorothiazide (HYDRODIURIL) 12.5 MG tablet TAKE 1 TABLET BY MOUTH EVERY DAY Patient taking differently: Take 12.5 mg by mouth daily.  06/15/18  Yes Sagardia, Ines Bloomer, MD  HYDROcodone-acetaminophen (NORCO/VICODIN) 5-325 MG tablet Take 1-2 tablets by mouth every 4 (four) hours as needed for moderate pain or severe pain. 10/21/18  Yes Wieting, Richard, MD  Insulin Degludec 200 UNIT/ML SOPN  Inject 80 Units into the skin at bedtime.    Yes [provider]  Insulin Pen Needle (BD PEN NEEDLE NANO U/F) 32G X 4 MM MISC Inject 1 each as directed 3 (three) times daily. 04/18/18  Yes Stallings, Zoe A, MD  losartan (COZAAR) 100 MG tablet TAKE 1 TABLET BY MOUTH EVERY DAY Patient taking differently: Take 100 mg by mouth daily.  08/16/18  Yes Sagardia, Ines Bloomer, MD  Melatonin 10 MG TABS Take 10 mg by mouth at bedtime as needed (sleep).   Yes [provider]  Multiple Vitamin (MULTIVITAMIN WITH MINERALS) TABS tablet Take 1 tablet by mouth daily.   Yes [provider]  omeprazole (PRILOSEC) 20 MG capsule Take 20 mg by mouth daily.    Yes [provider]  ONE TOUCH LANCETS MISC 1 each by Does not apply route 4 (four) times daily. Use with one touch meter to check blood sugar. Dx: E11.9 09/16/16  Yes Mikey College, NP  rosuvastatin (CRESTOR) 20 MG tablet Take 20 mg by mouth daily.   Yes [provider]  Semaglutide (OZEMPIC, 1 MG/DOSE, [place]) Inject 1 mg into the skin once a week.    Yes [provider]  SYNJARDY 09-998 MG TABS Take 1 tablet by mouth 2 (two) times daily with a meal. 07/06/18  Yes [provider]    Allergies Penicillins; Succinylcholine chloride; Keflex [cephalexin]; and Sulfa antibiotics  Family History  Problem Relation Age of Onset  . Ovarian cancer Mother   . Kidney failure Father   . Cancer Brother     Social History Social History   Tobacco Use  . Smoking status: Former Smoker    Packs/day: 2.00    Years: 20.00    Pack years: 40.00    Types: Cigarettes    Last attempt to quit: 06/28/1996    Years since quitting: 22.3  . Smokeless tobacco: Never Used  Substance Use Topics  . Alcohol use: Yes    Alcohol/week: 0.0 standard drinks    Comment: socially  . Drug use: No    Review of Systems  Constitutional:  fever/chills Eyes: No visual changes. ENT: No sore throat. Cardiovascular: Denies  chest pain. Respiratory:  shortness of breath. Gastrointestinal: No abdominal pain.  No nausea, no vomiting.  No diarrhea.  No constipation. Genitourinary: Negative for dysuria. Musculoskeletal: Negative for back pain. Skin: Negative for rash. Neurological: Negative for headaches, focal weakness   ____________________________________________   PHYSICAL EXAM:  VITAL SIGNS: ED Triage Vitals  Enc Vitals Group     BP 10/21/18 2141 139/67     Pulse Rate 10/21/18 2141 93     Resp 10/21/18 2141 (!) 22     Temp 10/21/18 2141 99.3 F (37.4 C)     Temp Source 10/21/18 2141 Oral     SpO2 10/21/18 2133 (!) 85 %  Weight 10/21/18 2142 (!) 370 lb 6 oz (168 kg)     Height 10/21/18 2142 6' 9"  (2.057 m)     Head Circumference --      Peak Flow --      Pain Score 10/21/18 2142 9     Pain Loc --      Pain Edu? --      Excl. in [place]? --     Constitutional: Alert and oriented.  Planing of pain in his knee and shortness of breath. Eyes: Conjunctivae are normal. Head: Atraumatic. Nose: No congestion/rhinnorhea. Mouth/Throat: Mucous membranes are moist.  Oropharynx non-erythematous. Neck: No stridor.  No cervical spine tenderness to palpation. Cardiovascular: Normal rate, regular rhythm. Grossly normal heart sounds.  Good peripheral circulation. Respiratory: Normal respiratory effort.  No retractions. Lungs occasional crackle. Gastrointestinal: Soft and nontender. No distention. No abdominal bruits. No CVA tenderness. Musculoskeletal: No lower extremity tenderness nor edema.  Except in the right knee where there is redness and swelling anteriorly with some skin loss and warmth. Neurologic:  Normal speech and language. No gross focal neurologic deficits are appreciated.  Patient has no focal weakness but he is kind of weak all over his knee is not supporting him well. Skin:  Skin is warm, dry and intact except for the knee as noted. No rash noted. Psychiatric: Mood and affect are normal. Speech  and behavior are normal.  ____________________________________________   LABS (all labs ordered are listed, but only abnormal results are displayed)  Labs Reviewed  CBC WITH DIFFERENTIAL/PLATELET - Abnormal; Notable for the following components:      Result Value   WBC 10.7 (*)    Hemoglobin 11.9 (*)    HCT 38.3 (*)    Eosinophils Absolute 0.7 (*)    All other components within normal limits  CULTURE, BLOOD (ROUTINE X 2)  CULTURE, BLOOD (ROUTINE X 2)  LACTIC ACID, PLASMA  PROTIME-INR  COMPREHENSIVE METABOLIC PANEL  URINALYSIS, COMPLETE (UACMP) WITH MICROSCOPIC  TROPONIN I  BRAIN NATRIURETIC PEPTIDE   ____________________________________________  EKG   ____________________________________________  RADIOLOGY  ED MD interpretation: X-ray read by radiology reviewed by me shows worsening multifocal patchy infiltrate  Official radiology report(s): Dg Chest Portable 1 View  Result Date: 10/21/2018 CLINICAL DATA:  63 year old male with worsening shortness of breath, low oxygen saturations and fever. EXAM: PORTABLE CHEST 1 VIEW COMPARISON:  Chest x-ray 10/18/2018. FINDINGS: Low lung volumes. Patchy ill-defined opacities and areas of interstitial prominence. No pleural effusions. Crowding of the pulmonary vasculature, without frank pulmonary edema. Heart size is upper limits of normal. Upper mediastinal contours are within normal limits. IMPRESSION: 1. Patchy ill-defined multifocal interstitial and airspace opacities concerning for developing multilobar pneumonia. Electronically Signed   By: Vinnie Langton M.D.   On: 10/21/2018 22:30    ____________________________________________   PROCEDURES  Procedure(s) performed (including Critical Care): Medical care time half an hour this includes discussing patient with the hospitalist evaluating the patient's old records and the patient himself reviewing the patient's studies and trying to see how mobile the patient was.  Procedures    ____________________________________________   INITIAL IMPRESSION / ASSESSMENT AND PLAN / ED COURSE Patient is too weak and unstable to get around at home.  He cannot manage his care at home and his wife cannot manage him as he is too large.  Additionally he is hypoxic and his chest x-ray looks worse.  We will have to get him back in the hospital.  Clinical Course as of Oct 30 843  Sat Oct 21, 2018  2339 Creatinine: 0.82 [PM]  Sun Oct 22, 2018  0052 Creatinine: 0.82 [PM]    Clinical Course User Index [PM] Nena Polio, MD     ____________________________________________   FINAL CLINICAL IMPRESSION(S) / ED DIAGNOSES  Final diagnoses:  Weakness  Hypoxia  Healthcare-associated pneumonia  Chronic pain of right knee     ED Discharge Orders    None       Note:  This document was prepared using Dragon voice recognition software and may include unintentional dictation errors.    Nena Polio, MD 10/21/18 2376    Nena Polio, MD 10/30/18 [phone]

## 2018-10-21 NOTE — TOC Transition Note (Signed)
Transition of Care [facility]) - CM/SW Discharge Note   Patient Details  Name: Nathan Dean MRN: [id number] Date of Birth: 11-21-1955  [facility] CM/SW Contact:  Latanya Maudlin, RN Phone Number: 10/21/2018, 8:29 AM   Clinical Narrative:   Patient to be discharged per MD order. Orders in place for home health services. Previous RNCM had began disposition for home health via [facility]. Notified Helene Kelp of pending discharge. Patient already has DME in place. Family to transport.      Final next level of care: [place] Barriers to Discharge: No Barriers Identified   Patient Goals and CMS Choice Patient states their goals for this hospitalization and ongoing recovery are:: go home CMS Medicare.gov Compare Post Acute Care list provided to:: Patient Choice offered to / list presented to : Patient  Discharge Placement                       Discharge Plan and Services   Discharge Planning Services: CM Consult Post Acute Care Choice: Home Health          DME Arranged: Patient refused services         HH Arranged: PT [place] Agency: [facility] (formerly [employer]) Date [place]: 10/21/18 Time [place]: [phone] Representative spoke with at [place]: [place] ([place]) Interventions     Readmission Risk Interventions Readmission Risk Prevention Plan 10/21/2018  Post Dischage Appt Complete  Medication Screening Complete  Transportation Screening Complete  Some recent data might be hidden

## 2018-10-22 ENCOUNTER — Encounter: Payer: Self-pay | Admitting: Radiology

## 2018-10-22 ENCOUNTER — Emergency Department: Payer: Managed Care, Other (non HMO)

## 2018-10-22 ENCOUNTER — Observation Stay
Admit: 2018-10-22 | Discharge: 2018-10-22 | Disposition: A | Discharge status: Home / Self Care | Payer: Managed Care, Other (non HMO) | Attending: Internal Medicine | Admitting: Internal Medicine

## 2018-10-22 DIAGNOSIS — I10 Essential (primary) hypertension: Secondary | ICD-10-CM

## 2018-10-22 DIAGNOSIS — R0902 Hypoxemia: Secondary | ICD-10-CM | POA: Diagnosis present

## 2018-10-22 DIAGNOSIS — R0989 Other specified symptoms and signs involving the circulatory and respiratory systems: Secondary | ICD-10-CM

## 2018-10-22 LAB — GLUCOSE, CAPILLARY
Glucose-Capillary: 119 mg/dL — ABNORMAL HIGH (ref 70–99)
Glucose-Capillary: 138 mg/dL — ABNORMAL HIGH (ref 70–99)
Glucose-Capillary: 128 mg/dL — ABNORMAL HIGH (ref 70–99)
Glucose-Capillary: 286 mg/dL — ABNORMAL HIGH (ref 70–99)

## 2018-10-22 LAB — URINALYSIS, COMPLETE (UACMP) WITH MICROSCOPIC
Bacteria, UA: NONE SEEN
Bilirubin Urine: NEGATIVE
Glucose, UA: >=500 mg/dL — AB
Ketones, ur: NEGATIVE mg/dL
Leukocytes,Ua: NEGATIVE
Nitrite: NEGATIVE
Protein, ur: NEGATIVE mg/dL
Specific Gravity, Urine: 1.036 — ABNORMAL HIGH (ref 1.005–1.030)
pH: 5 (ref 5.0–8.0)

## 2018-10-22 LAB — SARS CORONAVIRUS 2 BY RT PCR (HOSPITAL ORDER, PERFORMED IN ~~LOC~~ HOSPITAL LAB): SARS Coronavirus 2: NEGATIVE

## 2018-10-22 LAB — BRAIN NATRIURETIC PEPTIDE: B Natriuretic Peptide: 114 pg/mL — ABNORMAL HIGH (ref 0.0–100.0)

## 2018-10-22 MED ORDER — PERFLUTREN LIPID MICROSPHERE
1.0000 mL | INTRAVENOUS | Status: AC | PRN
  Administered 2018-10-22: 14:00:00 2 mL via INTRAVENOUS
  Filled 2018-10-22: qty 10

## 2018-10-22 MED ORDER — ONDANSETRON HCL 4 MG PO TABS: 4.0000 mg | ORAL_TABLET | Freq: Four times a day (QID) | ORAL | Status: DC | PRN

## 2018-10-22 MED ORDER — FUROSEMIDE 10 MG/ML IJ SOLN
40.0000 mg | Freq: Two times a day (BID) | INTRAMUSCULAR | Status: DC
  Administered 2018-10-22 – 2018-10-25 (×7): 40 mg via INTRAVENOUS
  Filled 2018-10-22 (×7): qty 4

## 2018-10-22 MED ORDER — ADULT MULTIVITAMIN W/MINERALS CH
1.0000 | ORAL_TABLET | Freq: Every day | ORAL | Status: DC
  Administered 2018-10-22 – 2018-10-26 (×5): 1 via ORAL
  Filled 2018-10-22 (×5): qty 1

## 2018-10-22 MED ORDER — IOHEXOL 350 MG/ML SOLN
75.0000 mL | Freq: Once | INTRAVENOUS | Status: AC | PRN
  Administered 2018-10-22: 75 mL via INTRAVENOUS
  Filled 2018-10-22: qty 75

## 2018-10-22 MED ORDER — HYDROCHLOROTHIAZIDE 25 MG PO TABS
12.5000 mg | ORAL_TABLET | Freq: Every day | ORAL | Status: DC
  Administered 2018-10-22: 12.5 mg via ORAL
  Filled 2018-10-22: qty 1

## 2018-10-22 MED ORDER — ACETAMINOPHEN 325 MG PO TABS: 650.0000 mg | ORAL_TABLET | Freq: Four times a day (QID) | ORAL | Status: DC | PRN

## 2018-10-22 MED ORDER — FUROSEMIDE 10 MG/ML IJ SOLN: 40.0000 mg | Freq: Two times a day (BID) | INTRAMUSCULAR | Status: DC

## 2018-10-22 MED ORDER — EMPAGLIFLOZIN-METFORMIN HCL 5-1000 MG PO TABS: 1.0000 | ORAL_TABLET | Freq: Two times a day (BID) | ORAL | Status: DC

## 2018-10-22 MED ORDER — GABAPENTIN 300 MG PO CAPS
900.0000 mg | ORAL_CAPSULE | Freq: Three times a day (TID) | ORAL | Status: DC
  Administered 2018-10-22 – 2018-10-26 (×13): 900 mg via ORAL
  Filled 2018-10-22 (×14): qty 3

## 2018-10-22 MED ORDER — ACETAMINOPHEN 650 MG RE SUPP: 650.0000 mg | Freq: Four times a day (QID) | RECTAL | Status: DC | PRN

## 2018-10-22 MED ORDER — INSULIN ASPART 100 UNIT/ML ~~LOC~~ SOLN
0.0000 [IU] | Freq: Three times a day (TID) | SUBCUTANEOUS | Status: DC
  Administered 2018-10-22 (×2): 1 [IU] via SUBCUTANEOUS
  Administered 2018-10-23 (×2): 2 [IU] via SUBCUTANEOUS
  Administered 2018-10-24: 5 [IU] via SUBCUTANEOUS
  Administered 2018-10-24: 3 [IU] via SUBCUTANEOUS
  Administered 2018-10-24: 1 [IU] via SUBCUTANEOUS
  Administered 2018-10-25: 2 [IU] via SUBCUTANEOUS
  Administered 2018-10-25: 1 [IU] via SUBCUTANEOUS
  Administered 2018-10-25: 3 [IU] via SUBCUTANEOUS
  Administered 2018-10-26: 2 [IU] via SUBCUTANEOUS
  Filled 2018-10-22 (×11): qty 1

## 2018-10-22 MED ORDER — ROSUVASTATIN CALCIUM 20 MG PO TABS
20.0000 mg | ORAL_TABLET | Freq: Every day | ORAL | Status: DC
  Administered 2018-10-22 – 2018-10-25 (×4): 20 mg via ORAL
  Filled 2018-10-22 (×2): qty 2
  Filled 2018-10-22 (×5): qty 1
  Filled 2018-10-22 (×2): qty 2

## 2018-10-22 MED ORDER — DOXYCYCLINE HYCLATE 100 MG PO TABS
100.0000 mg | ORAL_TABLET | Freq: Two times a day (BID) | ORAL | Status: DC
  Administered 2018-10-22 – 2018-10-26 (×9): 100 mg via ORAL
  Filled 2018-10-22 (×9): qty 1

## 2018-10-22 MED ORDER — MELATONIN 5 MG PO TABS
10.0000 mg | ORAL_TABLET | Freq: Every evening | ORAL | Status: DC | PRN
  Filled 2018-10-22: qty 2

## 2018-10-22 MED ORDER — HYDROCODONE-ACETAMINOPHEN 5-325 MG PO TABS
1.0000 | ORAL_TABLET | ORAL | Status: DC | PRN
  Administered 2018-10-22 (×2): 2 via ORAL
  Administered 2018-10-22: 1 via ORAL
  Administered 2018-10-23 – 2018-10-24 (×6): 2 via ORAL
  Administered 2018-10-25 (×2): 1 via ORAL
  Administered 2018-10-25 – 2018-10-26 (×2): 2 via ORAL
  Filled 2018-10-22 (×4): qty 2
  Filled 2018-10-22: qty 1
  Filled 2018-10-22 (×2): qty 2
  Filled 2018-10-22: qty 1
  Filled 2018-10-22: qty 2
  Filled 2018-10-22: qty 1
  Filled 2018-10-22 (×3): qty 2

## 2018-10-22 MED ORDER — LOSARTAN POTASSIUM 50 MG PO TABS
100.0000 mg | ORAL_TABLET | Freq: Every day | ORAL | Status: DC
  Administered 2018-10-22 – 2018-10-26 (×5): 100 mg via ORAL
  Filled 2018-10-22 (×5): qty 2

## 2018-10-22 MED ORDER — DULOXETINE HCL 30 MG PO CPEP
30.0000 mg | ORAL_CAPSULE | Freq: Every day | ORAL | Status: DC
  Administered 2018-10-22 – 2018-10-26 (×5): 30 mg via ORAL
  Filled 2018-10-22 (×5): qty 1

## 2018-10-22 MED ORDER — ENOXAPARIN SODIUM 40 MG/0.4ML ~~LOC~~ SOLN
40.0000 mg | Freq: Two times a day (BID) | SUBCUTANEOUS | Status: DC
  Administered 2018-10-22 – 2018-10-26 (×9): 40 mg via SUBCUTANEOUS
  Filled 2018-10-22 (×9): qty 0.4

## 2018-10-22 MED ORDER — AMLODIPINE BESYLATE 5 MG PO TABS
5.0000 mg | ORAL_TABLET | Freq: Every day | ORAL | Status: DC
  Administered 2018-10-22 – 2018-10-26 (×5): 5 mg via ORAL
  Filled 2018-10-22 (×5): qty 1

## 2018-10-22 MED ORDER — INSULIN DEGLUDEC 200 UNIT/ML ~~LOC~~ SOPN: 80.0000 [IU] | PEN_INJECTOR | Freq: Every day | SUBCUTANEOUS | Status: DC

## 2018-10-22 MED ORDER — TRAZODONE HCL 50 MG PO TABS: 25.0000 mg | ORAL_TABLET | Freq: Every evening | ORAL | Status: DC | PRN

## 2018-10-22 MED ORDER — LEVOFLOXACIN IN D5W 750 MG/150ML IV SOLN
750.0000 mg | INTRAVENOUS | Status: DC
  Administered 2018-10-23 – 2018-10-25 (×3): 750 mg via INTRAVENOUS
  Filled 2018-10-22 (×4): qty 150

## 2018-10-22 MED ORDER — INSULIN GLARGINE 100 UNIT/ML ~~LOC~~ SOLN
60.0000 [IU] | Freq: Every day | SUBCUTANEOUS | Status: DC
  Administered 2018-10-22: 60 [IU] via SUBCUTANEOUS
  Filled 2018-10-22 (×2): qty 0.6

## 2018-10-22 MED ORDER — CARVEDILOL 6.25 MG PO TABS
6.2500 mg | ORAL_TABLET | Freq: Two times a day (BID) | ORAL | Status: DC
  Administered 2018-10-22 – 2018-10-26 (×9): 6.25 mg via ORAL
  Filled 2018-10-22 (×9): qty 1

## 2018-10-22 MED ORDER — ASPIRIN EC 81 MG PO TBEC
81.0000 mg | DELAYED_RELEASE_TABLET | Freq: Every day | ORAL | Status: DC
  Administered 2018-10-22 – 2018-10-26 (×5): 81 mg via ORAL
  Filled 2018-10-22 (×5): qty 1

## 2018-10-22 MED ORDER — SEMAGLUTIDE (1 MG/DOSE) 2 MG/1.5ML ~~LOC~~ SOPN: 1.0000 mg | PEN_INJECTOR | SUBCUTANEOUS | Status: DC

## 2018-10-22 MED ORDER — ONDANSETRON HCL 4 MG/2ML IJ SOLN: 4.0000 mg | Freq: Four times a day (QID) | INTRAMUSCULAR | Status: DC | PRN

## 2018-10-22 MED ORDER — SODIUM CHLORIDE 0.9 % IV SOLN
INTRAVENOUS | Status: DC
  Administered 2018-10-22: 07:00:00 via INTRAVENOUS

## 2018-10-22 MED ORDER — MAGNESIUM HYDROXIDE 400 MG/5ML PO SUSP
30.0000 mL | Freq: Every day | ORAL | Status: DC | PRN
  Filled 2018-10-22: qty 30

## 2018-10-22 MED ORDER — POTASSIUM CHLORIDE CRYS ER 20 MEQ PO TBCR
40.0000 meq | EXTENDED_RELEASE_TABLET | Freq: Once | ORAL | Status: AC
  Administered 2018-10-22: 40 meq via ORAL
  Filled 2018-10-22: qty 2

## 2018-10-22 NOTE — ED Notes (Signed)
Call bell light, pt inquiry for room status "what's going on", [facility] called to ask to "fast track pt's admission"  Pt oriented to call bell, stretcher in lowest position, urinal at bedside

## 2018-10-22 NOTE — ED Notes (Signed)
Report finished and pt cleaned and bedding changed, apologized to pt for fall and delay to room, pt thanked this staff for help

## 2018-10-22 NOTE — ED Notes (Signed)
COVID swab collected at [place] but not "in process" until this time, lab called to assure "test running"

## 2018-10-22 NOTE — Progress Notes (Signed)
*  PRELIMINARY RESULTS* Echocardiogram 2D Echocardiogram has been performed. Definity IV Contrast used on this study.  Nathan Dean 10/22/2018, 2:17 PM

## 2018-10-22 NOTE — Progress Notes (Signed)
As it relates to daily phone updates for a family member, I believe the ER already had to contact patient's wife r/t a fall earlier this AM. I received a call from the wife earlier today during which she said she was "pissed" and "wanted a damn update". I attempted to calm her down and answer her questions, and I informed her I would request the physician contact her soon with a more extensive update. Per the physician's note she did. Haven't heard anything from wife since. It seems she was most likely upset from what did (or didn't) occur during his prior admission, which ended just yesterday. Will continue to monitor. Wenda Low [facility]

## 2018-10-22 NOTE — H&P (Signed)
[facility] NAME: Nathan Dean    MR#:  [id number]  DATE OF BIRTH:  01/21/1956  DATE OF ADMISSION:  10/21/2018  PRIMARY CARE PHYSICIAN: Horald Pollen, MD   REQUESTING/REFERRING PHYSICIAN: Conni Slipper, MD  CHIEF COMPLAINT:   Chief Complaint  Patient presents with   Leg Injury   Shortness of Breath    HISTORY OF PRESENT ILLNESS:  Nathan Dean  is a 63 y.o. Caucasian male with a known history of hypertension, dyslipidemia, obstructive sleep apnea and GERD as well as diabetes mellitus, who was just admitted here for pneumonia with subsequent sepsis and was managed with IV vancomycin aztreonam and Flagyl then discharged yesterday on p.o. doxycycline.  He had acute respiratory failure and a chest CT was negative for PE but showed groundglass opacities.  His initial COVID-19 test came back negative.  He was tapered off oxygen during his hospital course.  He was seen by physical therapy for recurrent falls and weakness and home health and home physical therapy was recommended.  He was having right knee pain and swelling apparently with mild cellulitis for which doxycycline was thought to be helping besides his pneumonia and orthopedic evaluation was planned.  After he went home he ran a low-grade fever of 99.3 and became hypoxic with O2 sat from 84 to 88%.  He was weak and unable to get around.  He slept and fell from his bed on his right knee with subsequent abrasion to his already swollen knee.  He did not have any head injuries.  No neck pain or headache or dizziness or blurred vision.  No chest pain or dyspnea or palpitations.  No presyncope or syncope.  Upon presentation to the emergency room, his vascularity was 88% on room air respiratory rate was 22 with a temperature of 99.3 and blood pressure 139/67 with a pulse of 93.  Labs showed a blood glucose of 117 potassium of 3.6 and his BNP was 114 with a WBC of 10.7 hemoglobin  of 11.9 hematocrit 38.3.  His troponin I was less than 0.03.  His right knee CT showed this total right knee arthroplasty and no evidence for hardware complication as well as diffuse subcutaneous soft tissue swelling with stranding and edema anterior to the knee along the vertical surgical incision.  There was no drainable fluid collection.  There was no fracture or dislocation.  A chest CT without contrast showed no acute pathology and interval resolution of the previously seen airspace densities with multivessel coronary vascular calcification.  The patient was given IV Levaquin in the emergency room.  He will be admitted to an observation medical bed for further evaluation and management. PAST MEDICAL HISTORY:   Past Medical History:  Diagnosis Date   Diabetes (Gas)    GERD (gastroesophageal reflux disease)    Hyperlipidemia    Hypertension    Sleep apnea     PAST SURGICAL HISTORY:   Past Surgical History:  Procedure Laterality Date   CARPAL TUNNEL RELEASE Right 12/28/2016   Procedure: RIGHT ULNAR AND MEDIAN NEUROPLASTY AT WRIST;  Surgeon: Milly Jakob, MD;  Location: [facility];  Service: Orthopedics;  Laterality: Right;   REPLACEMENT TOTAL KNEE BILATERAL  2014   left 2012 rt 2014    SOCIAL HISTORY:   Social History   Tobacco Use   Smoking status: Former Smoker    Packs/day: 2.00    Years: 20.00    Pack years: 40.00  Types: Cigarettes    Last attempt to quit: 06/28/1996    Years since quitting: 22.3   Smokeless tobacco: Never Used  Substance Use Topics   Alcohol use: Yes    Alcohol/week: 0.0 standard drinks    Comment: socially    FAMILY HISTORY:   Family History  Problem Relation Age of Onset   Ovarian cancer Mother    Kidney failure Father    Cancer Brother     DRUG ALLERGIES:   Allergies  Allergen Reactions   Penicillins Anaphylaxis, Rash and Other (See Comments)   Succinylcholine Chloride Anaphylaxis and Rash   Keflex  [Cephalexin] Rash   Sulfa Antibiotics Rash and Other (See Comments)    REVIEW OF SYSTEMS:   ROS As per history of present illness. All pertinent systems were reviewed above. Constitutional,  HEENT, cardiovascular, respiratory, GI, GU, musculoskeletal, neuro, psychiatric, endocrine,  integumentary and hematologic systems were reviewed and are otherwise  negative/unremarkable except for positive findings mentioned above in the HPI.   MEDICATIONS AT HOME:   Prior to Admission medications   Medication Sig Start Date End Date Taking? Authorizing Provider  amLODipine (NORVASC) 10 MG tablet Take 0.5 tablets (5 mg total) by mouth daily. 11/15/17  Yes Sagardia, Ines Bloomer, MD  aspirin 81 MG tablet Take 1 tablet (81 mg total) by mouth daily. 08/11/16  Yes Karamalegos, Devonne Doughty, DO  Blood Glucose Monitoring Suppl (ONE TOUCH ULTRA SYSTEM KIT) w/Device KIT 1 kit by Does not apply route once. Dx. E11.9 07/28/15  Yes Arlis Porta., MD  carvedilol (COREG) 6.25 MG tablet TAKE 1 TABLET BY MOUTH TWICE A DAY WITH FOOD 10/21/18  Yes Sagardia, Ines Bloomer, MD  doxycycline (VIBRA-TABS) 100 MG tablet Take 1 tablet (100 mg total) by mouth every 12 (twelve) hours. 10/21/18  Yes Wieting, Richard, MD  DULoxetine (CYMBALTA) 30 MG capsule Take 30 mg by mouth daily. 07/24/18  Yes [provider]  gabapentin (NEURONTIN) 300 MG capsule Take 3 capsules (900 mg total) by mouth 3 (three) times daily. 10/21/18  Yes Wieting, Richard, MD  glucose blood test strip 1 each by Other route 4 (four) times daily. Use with One touch meter to check blood sugar. Dx E11.9 09/13/16  Yes Mikey College, NP  hydrochlorothiazide (HYDRODIURIL) 12.5 MG tablet TAKE 1 TABLET BY MOUTH EVERY DAY Patient taking differently: Take 12.5 mg by mouth daily.  06/15/18  Yes Sagardia, Ines Bloomer, MD  HYDROcodone-acetaminophen (NORCO/VICODIN) 5-325 MG tablet Take 1-2 tablets by mouth every 4 (four) hours as needed for moderate pain or severe  pain. 10/21/18  Yes Wieting, Richard, MD  Insulin Degludec 200 UNIT/ML SOPN Inject 80 Units into the skin at bedtime.    Yes [provider]  Insulin Pen Needle (BD PEN NEEDLE NANO U/F) 32G X 4 MM MISC Inject 1 each as directed 3 (three) times daily. 04/18/18  Yes Stallings, Zoe A, MD  losartan (COZAAR) 100 MG tablet TAKE 1 TABLET BY MOUTH EVERY DAY Patient taking differently: Take 100 mg by mouth daily.  08/16/18  Yes Sagardia, Ines Bloomer, MD  Melatonin 10 MG TABS Take 10 mg by mouth at bedtime as needed (sleep).   Yes [provider]  Multiple Vitamin (MULTIVITAMIN WITH MINERALS) TABS tablet Take 1 tablet by mouth daily.   Yes [provider]  omeprazole (PRILOSEC) 20 MG capsule Take 20 mg by mouth daily.    Yes [provider]  ONE TOUCH LANCETS [place] 1 each by Does not apply route  4 (four) times daily. Use with one touch meter to check blood sugar. Dx: E11.9 09/16/16  Yes Mikey College, NP  rosuvastatin (CRESTOR) 20 MG tablet Take 20 mg by mouth daily.   Yes [provider]  Semaglutide (OZEMPIC, 1 MG/DOSE, [place]) Inject 1 mg into the skin once a week.    Yes [provider]  SYNJARDY 09-998 MG TABS Take 1 tablet by mouth 2 (two) times daily with a meal. 07/06/18  Yes [provider]      VITAL SIGNS:  Blood pressure 114/64, pulse 78, temperature 99.3 F (37.4 C), temperature source Oral, resp. rate 18, height _0  (2.057 m), weight (!) 168 kg, SpO2 96 %.  PHYSICAL EXAMINATION:  Physical Exam  GENERAL:  63 y.o.-year-old Caucasian male patient lying in the bed with no acute distress.  EYES: Pupils equal, round, reactive to light and accommodation. No scleral icterus. Extraocular muscles intact.  HEENT: Head atraumatic, normocephalic. Oropharynx and nasopharynx clear.  NECK:  Supple, no jugular venous distention. No thyroid enlargement, no tenderness.  LUNGS: Normal breath sounds bilaterally, no wheezing, rales,rhonchi or  crepitation. No use of accessory muscles of respiration.  CARDIOVASCULAR: Regular rate and rhythm, S1, S2 normal. No murmurs, rubs, or gallops.  ABDOMEN: Soft, nondistended, nontender. Bowel sounds present. No organomegaly or mass.  EXTREMITIES: Trace bilateral lower extremity edema, with no cyanosis, or clubbing.  NEUROLOGIC: Cranial nerves II through XII are intact. Muscle strength 5/5 in all extremities. Sensation intact. Gait not checked.  PSYCHIATRIC: The patient is alert and oriented x 3.  Normal affect and good eye contact. SKIN: Right knee contusion with associated weeping abrasions and tenderness with mild induration and warmth.  LABORATORY PANEL:   CBC Recent Labs  Lab 10/21/18 2155  WBC 10.7*  HGB 11.9*  HCT 38.3*  PLT 205   ------------------------------------------------------------------------------------------------------------------  Chemistries  Recent Labs  Lab 10/21/18 0523 10/21/18 2155  NA 143 138  K 4.3 3.6  CL 106 102  CO2 28 27  GLUCOSE 92 172*  BUN 19 19  CREATININE 0.73 0.82  CALCIUM 8.6* 8.4*  MG 2.3  --   AST  --  26  ALT  --  14  ALKPHOS  --  53  BILITOT  --  0.6   ------------------------------------------------------------------------------------------------------------------  Cardiac Enzymes Recent Labs  Lab 10/21/18 2155  TROPONINI <0.03   ------------------------------------------------------------------------------------------------------------------  RADIOLOGY:  Ct Chest Wo Contrast  Result Date: 10/21/2018 CLINICAL DATA:  63 year old male with recently discharged from hospital for pneumonia admission. Worsening pneumonia was seen on chest radiograph. EXAM: CT CHEST WITHOUT CONTRAST TECHNIQUE: Multidetector CT imaging of the chest was performed following the standard protocol without IV contrast. COMPARISON:  Chest radiograph dated 10/21/2018 and CT dated 10/18/2018 FINDINGS: Evaluation of this exam is limited in the absence of  intravenous contrast. Cardiovascular: There is no cardiomegaly or pericardial effusion. Multi vessel coronary vascular calcification with involvement of the LAD, RCA, and left circumflex artery. Mild atherosclerotic calcification of the thoracic aorta. Mild dilatation of the main pulmonary trunk suggestive of a degree of pulmonary hypertension. Mediastinum/Nodes: There is no hilar or mediastinal adenopathy. The esophagus is grossly unremarkable. No mediastinal fluid collection. Lungs/Pleura: Focal area of scarring in the inferior right middle lobe. The lungs are otherwise clear. There is no pleural effusion or pneumothorax. Overall interval clearance of the previously seen airspace densities. There is a punctate right middle lobe calcified granuloma. The central airways are patent. Upper Abdomen: There is a 14 mm low attenuating  nodular density from the anterior aspect of the neck of the pancreas (series 2 image 143. This lesion is not characterized on this CT but appears similar to the CT of 11/29/2013, likely a benign lesion such as a side branch IPMN. There is probable mild fatty infiltration of the liver. Musculoskeletal: Degenerative changes of the spine. No acute osseous pathology. IMPRESSION: 1. No acute intrathoracic pathology. Interval resolution of the previously seen airspace densities. 2. Multi vessel coronary vascular calcification. Electronically Signed   By: Anner Crete M.D.   On: 10/21/2018 23:55   Ct Angio Chest Pe W And/or Wo Contrast  Result Date: 10/22/2018 CLINICAL DATA:  63 year old male with history of acute onset of shortness of breath. Hypoxia. EXAM: CT ANGIOGRAPHY CHEST WITH CONTRAST TECHNIQUE: Multidetector CT imaging of the chest was performed using the standard protocol during bolus administration of intravenous contrast. Multiplanar CT image reconstructions and MIPs were obtained to evaluate the vascular anatomy. CONTRAST:  44m OMNIPAQUE IOHEXOL 350 MG/ML SOLN COMPARISON:   Chest CT 10/21/2018. FINDINGS: Cardiovascular: No filling defects within the pulmonary arterial tree to suggest underlying pulmonary embolism. Heart size is normal. There is no significant pericardial fluid, thickening or pericardial calcification. There is aortic atherosclerosis, as well as atherosclerosis of the great vessels of the mediastinum and the coronary arteries, including calcified atherosclerotic plaque in the left anterior descending and right coronary arteries. Severe thickening calcifications of the aortic valve. Mild calcifications of the mitral annulus/valve. Mediastinum/Nodes: No pathologically enlarged mediastinal or hilar lymph nodes. Esophagus is unremarkable in appearance. No axillary lymphadenopathy. Lungs/Pleura: Mild diffuse ground-glass attenuation in patchy septal thickening in the lungs bilaterally, similar to recent prior examinations, favored to represent mild interstitial pulmonary edema. Nodular area of architectural distortion in the right middle lobe, similar to recent prior examinations, likely to reflect an area of post infectious or inflammatory scarring. No confluent consolidative airspace disease. No pleural effusions. Upper Abdomen: Unremarkable. Musculoskeletal: There are no aggressive appearing lytic or blastic lesions noted in the visualized portions of the skeleton. Review of the MIP images confirms the above findings. IMPRESSION: 1. No evidence of pulmonary embolism on today's examination. 2. Mild cardiomegaly with mild interstitial pulmonary edema; imaging findings suggestive of congestive heart failure. 3. There are calcifications of the aortic valve and mitral valve/annulus. Echocardiographic correlation for evaluation of potential valvular dysfunction may be warranted if clinically indicated. 4. Aortic atherosclerosis, in addition to 2 vessel coronary artery disease. Please note that although the presence of coronary artery calcium documents the presence of coronary  artery disease, the severity of this disease and any potential stenosis cannot be assessed on this non-gated CT examination. Assessment for potential risk factor modification, dietary therapy or pharmacologic therapy may be warranted, if clinically indicated. Aortic Atherosclerosis (ICD10-I70.0). Electronically Signed   By: DVinnie LangtonM.D.   On: 10/22/2018 01:30   Ct Knee Right Wo Contrast  Result Date: 10/22/2018 CLINICAL DATA:  63year old male with right knee replacement presenting with knee pain and swelling after fall. EXAM: CT OF THE right KNEE WITHOUT CONTRAST TECHNIQUE: Multidetector CT imaging of the right knee was performed according to the standard protocol. Multiplanar CT image reconstructions were also generated. COMPARISON:  Right knee radiograph dated 10/18/2018 FINDINGS: Evaluation is limited due to streak artifact caused by metallic knee arthroplasty. Bones/Joint/Cartilage There is a total right knee arthroplasty. The arthroplasty components appear intact and in anatomic alignment. No evidence of hardware loosening. No acute fracture or dislocation identified. There is no significant joint effusion. Ligaments Suboptimally  assessed by CT. Muscles and Tendons No intramuscular fluid collection or hematoma. There is moderate fatty atrophy of the musculature. Soft tissues Diffuse subcutaneous soft tissue stranding and edema anterior to the knee and along the anterior vertical surgical incision. This may be inflammatory or infectious in etiology. Clinical correlation is recommended. No definite drainable fluid collection identified. IMPRESSION: 1. No acute fracture or dislocation. 2. Total right knee arthroplasty. No evidence of hardware complication. 3. Diffuse subcutaneous soft tissue stranding and edema anterior to the knee and along the vertical surgical incision. Clinical correlation is recommended to evaluate for infection. No drainable fluid collection identified. Electronically Signed    By: Anner Crete M.D.   On: 10/22/2018 00:02   Dg Chest Portable 1 View  Result Date: 10/21/2018 CLINICAL DATA:  63 year old male with worsening shortness of breath, low oxygen saturations and fever. EXAM: PORTABLE CHEST 1 VIEW COMPARISON:  Chest x-ray 10/18/2018. FINDINGS: Low lung volumes. Patchy ill-defined opacities and areas of interstitial prominence. No pleural effusions. Crowding of the pulmonary vasculature, without frank pulmonary edema. Heart size is upper limits of normal. Upper mediastinal contours are within normal limits. IMPRESSION: 1. Patchy ill-defined multifocal interstitial and airspace opacities concerning for developing multilobar pneumonia. Electronically Signed   By: Vinnie Langton M.D.   On: 10/21/2018 22:30      IMPRESSION AND PLAN:   1.  Right knee cellulitis with associated contusion status post mechanical fall with recent recurrent falls.  He will be admitted to a medically monitored bed.  We will continue him on IV Levaquin for now and given his recent pneumonia we will continue his p.o. doxycycline for MRSA coverage.  Wound Gram stain culture and sensitivity will be sent.  Blood cultures were sent as well. Physical therapy consult will be obtained as well as case management consult to assess for short-term rehabilitation.  Pain management will be provided.  2.  Hypoxia.  It is unclear if this was related to his obstructive sleep apnea or recent pneumonia.  His chest CT is showing resolution of his pneumonia.  O2 protocol will be followed for now and antibiotic therapy as mentioned above with IV Levaquin and p.o. doxycycline.  3.  Hypertension.  Amlodipine, HCTZ and Coreg will be resumed.  4.  Dyslipidemia.  Crestor will be resumed.  5.  Obstructive sleep apnea.  We will continue him on CPAP nightly.  6.  DVT prophylaxis.  Subcutaneous Lovenox    All the records are reviewed and case discussed with ED provider. The plan of care was discussed in details  with the patient (and family). I answered all questions. The patient agreed to proceed with the above mentioned plan. Further management will depend upon hospital course.   CODE STATUS: Full code  TOTAL TIME TAKING CARE OF THIS PATIENT: 50 minutes.    Christel Mormon M.D on 10/22/2018 at 5:12 AM  Pager - [phone]  After 6pm go to [email or web address] - Proofreader  [facility]  Office  [phone]  CC: Primary care physician; Horald Pollen, MD   Note: This dictation was prepared with Dragon dictation along with smaller phrase technology. Any transcriptional errors that result from this process are unintentional.

## 2018-10-22 NOTE — ED Notes (Signed)
Patient transported to CT 

## 2018-10-22 NOTE — ED Notes (Signed)
Pt assisted into urinal, pt left in room to facilitate urinating, returned 1 minute later, pt declined TV or further intervention, lying in stretcher without distress, breathing effort appears relieved from triage, knee redressed from weeping serosanguinous, legs elevated slightly

## 2018-10-22 NOTE — Evaluation (Signed)
Physical Therapy Evaluation Patient Details Name: Nathan Dean MRN: [id number] DOB: [birth year] Today's Date: 10/22/2018   History of Present Illness  Nathan Dean is a 47yoM who comes to [facility] after a fall onto his Right knee. Knee was already swollen and injured from 3 prior falls the week before, 1 of which occured while admitted and required nearly an hour to get patient off floor d/t the patient's large size. Pt has since fallen again in hospital. Pt was noted to have ARF upon arrival requiring O2 to maintain saturations.  2DA while admitted pt AMB 243ft with RW without signs of instability. PMH: OSA, HTN, HLD, GERD, DM.   Clinical Impression  Pt admitted with above diagnosis. Pt currently with functional limitations due to the deficits listed below (see "PT Problem List"). Upon entry, pt in bed in a dark and quiet room, TV off. Pt is awake and agreeable to participate. The pt is familiar to author from prior admission. He expresses calm anger and frustration with current situation and being back in the hospital after just recently leaving. Generally patient is pleasant, conversational, and generally a good historian. He offers detail on two recent falls since last session, where he denies LOC, presyncope, dizziness, but endorses Knee buckling and landing on knee. Pt reports he was not using a RW when these occurred and is agreeable that he would have been able to avoid fall to floor had he been using a RW. This date, his knee looks much worse than 2DA, now with additional edema, ecchymosis, skin tear, and drainage. ROM is more limited, limb is far weaker, and pain is more extreme. Pt requires assistance moving limb in/out of bed. Transfers and AMB performed with RW and minGuard assist for very high elevated surface, but AMB limited to 61ft 2/2 pain, whereas 2DA he tolerated 241ft AMB with author. Functional mobility assessment demonstrates increased effort/time requirements, poor tolerance, and need  for physical assistance, whereas the patient performed these at a higher level of independence PTA. As patient would benefit from extensive rehab process for improved strength and wife has been unable to physically assist the patient off the floor after multiple falls, STR remains the best option. Pt will benefit from skilled PT intervention to increase independence and safety with basic mobility in preparation for discharge to the venue listed below.       Follow Up Recommendations SNF    Equipment Recommendations  None recommended by PT    Recommendations for Other Services       Precautions / Restrictions Precautions Precautions: Fall Precaution Comments: Right knee wound, edema Restrictions Weight Bearing Restrictions: No      Mobility  Bed Mobility Overal bed mobility: Needs Assistance Bed Mobility: Supine to Sit;Sit to Supine     Supine to sit: Min assist Sit to supine: Min assist   General bed mobility comments: needs assist with Rt leg in and out of bed  Transfers Overall transfer level: Needs assistance Equipment used: Rolling walker (2 wheeled)(bariatric RW (500lb)) Transfers: Sit to/from Stand Sit to Stand: Min guard;From elevated surface         General transfer comment: Able to perform 3x, but failure upon 4th attempt and pt stops d/t worseing pain in knee  Ambulation/Gait Ambulation/Gait assistance: Supervision Gait Distance (Feet): 10 Feet Assistive device: Rolling walker (2 wheeled) Gait Pattern/deviations: WFL(Within Functional Limits)     General Gait Details: limited by pain in Right knee, pt elects to return to sitting. SpO2 on 2L 96%.  Stairs            Wheelchair Mobility    Modified Rankin (Stroke Patients Only)       Balance Overall balance assessment: History of Falls;Mild deficits observed, not formally tested                                           Pertinent Vitals/Pain Pain Assessment: 0-10 Pain  Score: 9  Pain Location: R knee  Pain Descriptors / Indicators: Burning Pain Intervention(s): Limited activity within patient's tolerance;Monitored during session;Repositioned;Patient requesting pain meds-RN notified;Ice applied    Home Living Family/patient expects to be discharged to:: Private residence Living Arrangements: Spouse/significant other Available Help at Discharge: Family;Available 24 hours/day Type of Home: House Home Access: Stairs to enter Entrance Stairs-Rails: Right;Left;Can reach both Entrance Stairs-Number of Steps: 4 Home Layout: One level Home Equipment: Toilet riser;Walker - 2 wheels;Other (comment) Additional Comments: owns tripod cane    Prior Function Level of Independence: Independent with assistive device(s)         Comments: Ind amb in the home without an AD, tripod cane in the community, Ind with ADLs, 5 falls in the last 2 weeks, no prior significant falls history     Hand Dominance        Extremity/Trunk Assessment        Lower Extremity Assessment Lower Extremity Assessment: Generalized weakness;RLE deficits/detail(Bilat LEE, pt reports as baseline. ) RLE Deficits / Details: generally edematous knee, with areas of purple and dark red echymosis about the patella, acute trauma apparent since visualized by author 2DA (ROM and strength heavily limited by pain) RLE Sensation: WNL    Cervical / Trunk Assessment Cervical / Trunk Assessment: Normal  Communication   Communication: No difficulties  Cognition Arousal/Alertness: Awake/alert Behavior During Therapy: WFL for tasks assessed/performed(withdrawn, quiet) Overall Cognitive Status: Within Functional Limits for tasks assessed                                        General Comments      Exercises General Exercises - Lower Extremity Heel Slides: AAROM;Right;Supine;10 reps;Limitations Heel Slides Limitations: ROM more limited and pain more extrmeme than 2DA prior  admission   Assessment/Plan    PT Assessment Patient needs continued PT services  PT Problem List Decreased strength;Decreased activity tolerance;Decreased balance;Pain;Decreased mobility;Decreased range of motion       PT Treatment Interventions DME instruction;Gait training;Stair training;Functional mobility training;Therapeutic activities;Therapeutic exercise;Balance training;Patient/family education    PT Goals (Current goals can be found in the Care Plan section)  Acute Rehab PT Goals Patient Stated Goal: To get stronger PT Goal Formulation: With patient Time For Goal Achievement: 11/05/18 Potential to Achieve Goals: Good    Frequency 7X/week   Barriers to discharge        Co-evaluation               AM-PAC PT "6 Clicks" Mobility  Outcome Measure Help needed turning from your back to your side while in a flat bed without using bedrails?: None Help needed moving from lying on your back to sitting on the side of a flat bed without using bedrails?: A Little Help needed moving to and from a bed to a chair (including a wheelchair)?: A Little Help needed standing up from a chair using your  arms (e.g., wheelchair or bedside chair)?: A Lot Help needed to walk in hospital room?: A Little Help needed climbing 3-5 steps with a railing? : A Little 6 Click Score: 18    End of Session Equipment Utilized During Treatment: Oxygen Activity Tolerance: Patient limited by pain Patient left: in bed;with call bell/phone within reach;with nursing/sitter in room;Other (comment)(2 Ice packs applied to knee) Nurse Communication: Mobility status PT Visit Diagnosis: Unsteadiness on feet (R26.81);History of falling (Z91.81);Muscle weakness (generalized) (M62.81);Difficulty in walking, not elsewhere classified (R26.2);Pain;Other abnormalities of gait and mobility (R26.89);Repeated falls (R29.6) Pain - Right/Left: Right Pain - part of body: Knee    Time: [id number] PT Time Calculation (min)  (ACUTE ONLY): 38 min   Charges:   PT Evaluation $PT Eval Moderate Complexity: 1 Mod PT Treatments $Therapeutic Exercise: 8-22 mins        4:35 PM, 10/22/18 Rosamaria LintsAllan C Kamdon Dean, PT, DPT Physical Therapist - [facility]  [phone] (ASCOM)   Yvana Samonte C 10/22/2018, 4:29 PM

## 2018-10-22 NOTE — ED Notes (Addendum)
Unable to call reports in timely manner d/t COVID pt assessment in adjacent room with Dr Archie Balboa  Call bell light answered from adjacent COVID positive room, pt reports already urinated on self "you're too late", pt asked why urinal on bed rail not used - pt denies knowledge on urinal  Call from [place], sec, reports wife calling and upset: "no one is checking on him for tha last two hours and he's still in the basement"  Charge called to notify of ongoing problem to safely handle pt, directed to document that

## 2018-10-22 NOTE — ED Notes (Signed)
Call bell answered, pt asking about status of room, charge notified, prime paged

## 2018-10-22 NOTE — H&P (Signed)
[place] [facility] [place] NAME: Nathan Dean    MR#:  [id number]  DATE OF BIRTH:  01-13-1956  SUBJECTIVE: Charge yesterday, readmitted last night because patient had a fall, also her shortness of breath.  Patient now on 6 L of oxygen, saturation is more than 96%, will wean off oxygen, because of recurrent falls now wife is requesting if we can place him in rehab.  Appears to be confused, patient thinks this is evening when I saw him around 8 AM today morning he thinks it is evening  CHIEF COMPLAINT:   Chief Complaint  Patient presents with  . Leg Injury  . Shortness of Breath    REVIEW OF SYSTEMS:   ROS CONSTITUTIONAL: Fatigue, generalized weakness, EYES: No blurred or double vision.  EARS, NOSE, AND THROAT: No tinnitus or ear pain.  RESPIRATORY: No cough, shortness of breath, wheezing or hemoptysis.  CARDIOVASCULAR: No chest pain, orthopnea, edema.  GASTROINTESTINAL: No nausea, vomiting, diarrhea or abdominal pain.  GENITOURINARY: No dysuria, hematuria.  ENDOCRINE: No polyuria, nocturia,  HEMATOLOGY: No anemia, easy bruising or bleeding SKIN: No rash or lesion. MUSCULOSKELETAL: Right knee swelling, limited range of motion. NEUROLOGIC: No tingling, numbness, weakness.  PSYCHIATRY: No anxiety or depression.   DRUG ALLERGIES:   Allergies  Allergen Reactions  . Penicillins Anaphylaxis, Rash and Other (See Comments)  . Succinylcholine Chloride Anaphylaxis and Rash  . Keflex [Cephalexin] Rash  . Sulfa Antibiotics Rash and Other (See Comments)    VITALS:  Blood pressure (!) 123/53, pulse 79, temperature 98.3 F (36.8 C), temperature source Oral, resp. rate 17, height 6\' 9"  (2.057 m), weight (!) 170.4 kg, SpO2 98 %.  PHYSICAL EXAMINATION:  GENERAL:  63 y.o.-year-old patient lying in the bed with no acute distress.  EYES: Pupils equal, round, reactive to light and accommodation. No scleral icterus. Extraocular muscles intact.   HEENT: Head atraumatic, normocephalic. Oropharynx and nasopharynx clear.  NECK:  Supple, no jugular venous distention. No thyroid enlargement, no tenderness.  LUNGS: Normal breath sounds bilaterally, no wheezing, rales,rhonchi or crepitation. No use of accessory muscles of respiration.  CARDIOVASCULAR: S1, S2 normal. No murmurs, rubs, or gallops.  ABDOMEN: Soft, nontender, nondistended. Bowel sounds present. No organomegaly or mass.  EXTREMITIES: Right knee swelling, limited range of motion. NEUROLOGIC: Cranial nerves II through XII are intact. Muscle strength 5/5 in all extremities. Sensation intact. Gait not checked.  PSYCHIATRIC: The patient is alert and oriented x 3.  SKIN: No obvious rash, lesion, or ulcer.    LABORATORY PANEL:   CBC Recent Labs  Lab 10/21/18 2155  WBC 10.7*  HGB 11.9*  HCT 38.3*  PLT 205   ------------------------------------------------------------------------------------------------------------------  Chemistries  Recent Labs  Lab 10/21/18 0523 10/21/18 2155  NA 143 138  K 4.3 3.6  CL 106 102  CO2 28 27  GLUCOSE 92 172*  BUN 19 19  CREATININE 0.73 0.82  CALCIUM 8.6* 8.4*  MG 2.3  --   AST  --  26  ALT  --  14  ALKPHOS  --  20  BILITOT  --  0.6   ------------------------------------------------------------------------------------------------------------------  Cardiac Enzymes Recent Labs  Lab 10/21/18 2155  TROPONINI <0.03   ------------------------------------------------------------------------------------------------------------------  RADIOLOGY:  Ct Chest Wo Contrast  Result Date: 10/21/2018 CLINICAL DATA:  63 year old male with recently discharged from hospital for pneumonia admission. Worsening pneumonia was seen on chest radiograph. EXAM: CT CHEST WITHOUT CONTRAST TECHNIQUE: Multidetector CT imaging of the chest was performed following the  standard protocol without IV contrast. COMPARISON:  Chest radiograph dated 10/21/2018 and  CT dated 10/18/2018 FINDINGS: Evaluation of this exam is limited in the absence of intravenous contrast. Cardiovascular: There is no cardiomegaly or pericardial effusion. Multi vessel coronary vascular calcification with involvement of the LAD, RCA, and left circumflex artery. Mild atherosclerotic calcification of the thoracic aorta. Mild dilatation of the main pulmonary trunk suggestive of a degree of pulmonary hypertension. Mediastinum/Nodes: There is no hilar or mediastinal adenopathy. The esophagus is grossly unremarkable. No mediastinal fluid collection. Lungs/Pleura: Focal area of scarring in the inferior right middle lobe. The lungs are otherwise clear. There is no pleural effusion or pneumothorax. Overall interval clearance of the previously seen airspace densities. There is a punctate right middle lobe calcified granuloma. The central airways are patent. Upper Abdomen: There is a 14 mm low attenuating nodular density from the anterior aspect of the neck of the pancreas (series 2 image 143. This lesion is not characterized on this CT but appears similar to the CT of 11/29/2013, likely a benign lesion such as a side branch IPMN. There is probable mild fatty infiltration of the liver. Musculoskeletal: Degenerative changes of the spine. No acute osseous pathology. IMPRESSION: 1. No acute intrathoracic pathology. Interval resolution of the previously seen airspace densities. 2. Multi vessel coronary vascular calcification. Electronically Signed   By: Elgie CollardArash  Radparvar M.D.   On: 10/21/2018 23:55   Ct Angio Chest Pe W And/or Wo Contrast  Result Date: 10/22/2018 CLINICAL DATA:  63 year old male with history of acute onset of shortness of breath. Hypoxia. EXAM: CT ANGIOGRAPHY CHEST WITH CONTRAST TECHNIQUE: Multidetector CT imaging of the chest was performed using the standard protocol during bolus administration of intravenous contrast. Multiplanar CT image reconstructions and MIPs were obtained to evaluate the  vascular anatomy. CONTRAST:  75mL OMNIPAQUE IOHEXOL 350 MG/ML SOLN COMPARISON:  Chest CT 10/21/2018. FINDINGS: Cardiovascular: No filling defects within the pulmonary arterial tree to suggest underlying pulmonary embolism. Heart size is normal. There is no significant pericardial fluid, thickening or pericardial calcification. There is aortic atherosclerosis, as well as atherosclerosis of the great vessels of the mediastinum and the coronary arteries, including calcified atherosclerotic plaque in the left anterior descending and right coronary arteries. Severe thickening calcifications of the aortic valve. Mild calcifications of the mitral annulus/valve. Mediastinum/Nodes: No pathologically enlarged mediastinal or hilar lymph nodes. Esophagus is unremarkable in appearance. No axillary lymphadenopathy. Lungs/Pleura: Mild diffuse ground-glass attenuation in patchy septal thickening in the lungs bilaterally, similar to recent prior examinations, favored to represent mild interstitial pulmonary edema. Nodular area of architectural distortion in the right middle lobe, similar to recent prior examinations, likely to reflect an area of post infectious or inflammatory scarring. No confluent consolidative airspace disease. No pleural effusions. Upper Abdomen: Unremarkable. Musculoskeletal: There are no aggressive appearing lytic or blastic lesions noted in the visualized portions of the skeleton. Review of the MIP images confirms the above findings. IMPRESSION: 1. No evidence of pulmonary embolism on today's examination. 2. Mild cardiomegaly with mild interstitial pulmonary edema; imaging findings suggestive of congestive heart failure. 3. There are calcifications of the aortic valve and mitral valve/annulus. Echocardiographic correlation for evaluation of potential valvular dysfunction may be warranted if clinically indicated. 4. Aortic atherosclerosis, in addition to 2 vessel coronary artery disease. Please note that  although the presence of coronary artery calcium documents the presence of coronary artery disease, the severity of this disease and any potential stenosis cannot be assessed on this non-gated CT examination. Assessment for potential  risk factor modification, dietary therapy or pharmacologic therapy may be warranted, if clinically indicated. Aortic Atherosclerosis (ICD10-I70.0). Electronically Signed   By: Trudie Reedaniel  Entrikin M.D.   On: 10/22/2018 01:30   Ct Knee Right Wo Contrast  Result Date: 10/22/2018 CLINICAL DATA:  63 year old male with right knee replacement presenting with knee pain and swelling after fall. EXAM: CT OF THE right KNEE WITHOUT CONTRAST TECHNIQUE: Multidetector CT imaging of the right knee was performed according to the standard protocol. Multiplanar CT image reconstructions were also generated. COMPARISON:  Right knee radiograph dated 10/18/2018 FINDINGS: Evaluation is limited due to streak artifact caused by metallic knee arthroplasty. Bones/Joint/Cartilage There is a total right knee arthroplasty. The arthroplasty components appear intact and in anatomic alignment. No evidence of hardware loosening. No acute fracture or dislocation identified. There is no significant joint effusion. Ligaments Suboptimally assessed by CT. Muscles and Tendons No intramuscular fluid collection or hematoma. There is moderate fatty atrophy of the musculature. Soft tissues Diffuse subcutaneous soft tissue stranding and edema anterior to the knee and along the anterior vertical surgical incision. This may be inflammatory or infectious in etiology. Clinical correlation is recommended. No definite drainable fluid collection identified. IMPRESSION: 1. No acute fracture or dislocation. 2. Total right knee arthroplasty. No evidence of hardware complication. 3. Diffuse subcutaneous soft tissue stranding and edema anterior to the knee and along the vertical surgical incision. Clinical correlation is recommended to  evaluate for infection. No drainable fluid collection identified. Electronically Signed   By: Elgie CollardArash  Radparvar M.D.   On: 10/22/2018 00:02   Dg Chest Portable 1 View  Result Date: 10/21/2018 CLINICAL DATA:  63 year old male with worsening shortness of breath, low oxygen saturations and fever. EXAM: PORTABLE CHEST 1 VIEW COMPARISON:  Chest x-ray 10/18/2018. FINDINGS: Low lung volumes. Patchy ill-defined opacities and areas of interstitial prominence. No pleural effusions. Crowding of the pulmonary vasculature, without frank pulmonary edema. Heart size is upper limits of normal. Upper mediastinal contours are within normal limits. IMPRESSION: 1. Patchy ill-defined multifocal interstitial and airspace opacities concerning for developing multilobar pneumonia. Electronically Signed   By: Trudie Reedaniel  Entrikin M.D.   On: 10/21/2018 22:30    EKG:   Orders placed or performed during the hospital encounter of 10/18/18  . EKG 12-Lead  . EKG 12-Lead  . ED EKG 12-Lead  . ED EKG 12-Lead    ASSESSMENT AND PLAN:  63 year old male with morbid obesity, recently discharged yesterday after he was diagnosed with prepatellar bursitis, sepsis discharged home with doxycycline had a fall again, wife mentioned that she went to pick up doxycycline prescription she came back she  noted that he fell, he looked confused. #1 multiple recurrent falls, right knee contusion, CT of the right knee did not show any hardware infection, right now he is on doxycycline, consult wound care nurse, if patient continues to have pain, swelling will consult Dr. Allena KatzPatel.  Patient was seen by Dr. Ernest PineHooten last time. 2.  Recurrent falls, physical therapy to see the patient: Wife is requesting rehab placement this time. 3.  Morbid obesity, sleep apnea, use CPAP at night 4.  Acute respiratory failure likely due to combination of obesity hypoventilation, clinical pneumonia, continue doxycycline, wean off oxygen to keep saturation more than 90%, also look  at echocardiogram to evaluate for EF.  BNP is within normal range.  I increase the Lasix/same explained to patient's wife.  CT angios chest done did not show any PE. 5.  Depression, patient's wife mentioned that patient has message to  give up, patient brother recently passed away, wife mentions that he is rest, recommend psychiatric consult, wife is okay with that. 6.  Diabetes mellitus type 2: Continue home medicines.   More than 50% time spent in counseling, coordination of care.  Plan to consult wound care, echocardiogram, psychiatric consult, physical therapy evaluation.  I spoke to patient's wife at length, call her at [phone].  Her name is Daemon Dowty  All the records are reviewed and case discussed with Care Management/Social Workerr. Management plans discussed with the patient, family and they are in agreement.  CODE STATUS: Full code  TOTAL TIME TAKING CARE OF THIS PATIENT: 45 minutes.   POSSIBLE D/C IN 2-3 DAYS, DEPENDING ON CLINICAL CONDITION.   Epifanio Lesches M.D on 10/22/2018 at 1:22 PM  Between 7am to 6pm - Pager - [phone]  After 6pm go to [email or web address] - password [email or web address] [place] [facility]  Office  [phone]  CC: Primary care physician; Horald Pollen, MD   Note: This dictation was prepared with Dragon dictation along with smaller phrase technology. Any transcriptional errors that result from this process are unintentional.

## 2018-10-22 NOTE — ED Notes (Signed)
ED TO INPATIENT HANDOFF REPORT  ED Nurse Name and Phone #: Danelle Earthlyoel RN #[phone]  S Name/Age/Gender Nathan Dean 63 y.o. male Room/Bed: ED35A/ED35A  Code Status   Code Status: Full Code  Home/SNF/Other Rehab Patient oriented to: self, place, time and situation Is this baseline? Yes   Triage Complete: Triage complete  Chief Complaint poss sepsis  Triage Note Patient presents to Emergency Department via [facility] from home with complaints of right knee swelling and pain - area appears red and feels warm, pt also c/o of SOB - wheeze heard.    Pt was discharged from hospital today for PNE admission.    Pt reports fall with injury to right knee 1.5 weeks ago.      History of DM, HTN   Allergies Allergies  Allergen Reactions  . Penicillins Anaphylaxis, Rash and Other (See Comments)  . Succinylcholine Chloride Anaphylaxis and Rash  . Keflex [Cephalexin] Rash  . Sulfa Antibiotics Rash and Other (See Comments)    Level of Care/Admitting Diagnosis ED Disposition    ED Disposition Condition Comment   Admit  Hospital Area[facility] [[id number]]  Level of Care: Med-Surg [16]  Covid Evaluation: Confirmed COVID Negative  Diagnosis: Hypoxia [300808]  Admitting Physician: Hannah BeatMANSY, JAN A [[id number]]  Attending Physician: Hannah BeatMANSY, JAN A [[id number]]  PT Class (Do Not Modify): Observation [104]  PT Acc Code (Do Not Modify): Observation [10022]       B Medical/Surgery History Past Medical History:  Diagnosis Date  . Diabetes (HCC)   . GERD (gastroesophageal reflux disease)   . Hyperlipidemia   . Hypertension   . Sleep apnea    Past Surgical History:  Procedure Laterality Date  . CARPAL TUNNEL RELEASE Right 12/28/2016   Procedure: RIGHT ULNAR AND MEDIAN NEUROPLASTY AT WRIST;  Surgeon: Mack Hookhompson, David, MD;  Location: [facility];  Service: Orthopedics;  Laterality: Right;  . REPLACEMENT TOTAL KNEE BILATERAL  2014   left 2012 rt 2014     A IV  Location/Drains/Wounds Patient Lines/Drains/Airways Status   Active Line/Drains/Airways    Name:   Placement date:   Placement time:   Site:   Days:   Peripheral IV 10/21/18 Left Forearm   10/21/18    2133    Forearm   1   Peripheral IV 10/21/18 Right Antecubital   10/21/18    2133    Antecubital   1          Intake/Output Last 24 hours  Intake/Output Summary (Last 24 hours) at 10/22/2018 0521 Last data filed at 10/22/2018 0244 Gross per 24 hour  Intake 150 ml  Output 425 ml  Net -275 ml    Labs/Imaging Results for orders placed or performed during the hospital encounter of 10/21/18 (from the past 48 hour(s))  Comprehensive metabolic panel     Status: Abnormal   Collection Time: 10/21/18  9:55 PM  Result Value Ref Range   Sodium 138 135 - 145 mmol/L   Potassium 3.6 3.5 - 5.1 mmol/L   Chloride 102 98 - 111 mmol/L   CO2 27 22 - 32 mmol/L   Glucose, Bld 172 (H) 70 - 99 mg/dL   BUN 19 8 - 23 mg/dL   Creatinine, Ser 0.860.82 0.61 - 1.24 mg/dL   Calcium 8.4 (L) 8.9 - 10.3 mg/dL   Total Protein 6.7 6.5 - 8.1 g/dL   Albumin 3.0 (L) 3.5 - 5.0 g/dL   AST 26 15 - 41 U/L   ALT 14  0 - 44 U/L   Alkaline Phosphatase 57 38 - 126 U/L   Total Bilirubin 0.6 0.3 - 1.2 mg/dL   GFR calc non Af Amer >60 >60 mL/min   GFR calc Af Amer >60 >60 mL/min   Anion gap 9 5 - 15    Comment: Performed at [facility], [address] [place], [place] [address]  Lactic acid, plasma     Status: None   Collection Time: 10/21/18  9:55 PM  Result Value Ref Range   Lactic Acid, Venous 1.4 0.5 - 1.9 mmol/L    Comment: Performed at [facility], [address] [place], [place] [address]  CBC with Differential     Status: Abnormal   Collection Time: 10/21/18  9:55 PM  Result Value Ref Range   WBC 10.7 (H) 4.0 - 10.5 K/uL   RBC 4.29 4.22 - 5.81 MIL/uL   Hemoglobin 11.9 (L) 13.0 - 17.0 g/dL   HCT 57.838.3 (L) 46.939.0 - 62.952.0 %   MCV 89.3 80.0 - 100.0 fL   MCH 27.7 26.0 - 34.0 pg   MCHC 31.1 30.0 -  36.0 g/dL   RDW 52.814.5 41.311.5 - 24.415.5 %   Platelets 205 150 - 400 K/uL   nRBC 0.0 0.0 - 0.2 %   Neutrophils Relative % 72 %   Neutro Abs 7.7 1.7 - 7.7 K/uL   Lymphocytes Relative 11 %   Lymphs Abs 1.2 0.7 - 4.0 K/uL   Monocytes Relative 10 %   Monocytes Absolute 1.0 0.1 - 1.0 K/uL   Eosinophils Relative 6 %   Eosinophils Absolute 0.7 (H) 0.0 - 0.5 K/uL   Basophils Relative 1 %   Basophils Absolute 0.1 0.0 - 0.1 K/uL   Immature Granulocytes 0 %   Abs Immature Granulocytes 0.04 0.00 - 0.07 K/uL    Comment: Performed at [facility], [address] [place], [place] [address]  Protime-INR     Status: None   Collection Time: 10/21/18  9:55 PM  Result Value Ref Range   Prothrombin Time 13.5 11.4 - 15.2 seconds   INR 1.0 0.8 - 1.2    Comment: (NOTE) INR goal varies based on device and disease states. Performed at [facility], [address] [place], [place] [address]   Troponin I - Add-On to previous collection     Status: None   Collection Time: 10/21/18  9:55 PM  Result Value Ref Range   Troponin I <0.03 <0.03 ng/mL    Comment: Performed at [facility], [address] [place], [place] [address]  Brain natriuretic peptide     Status: Abnormal   Collection Time: 10/21/18  9:55 PM  Result Value Ref Range   B Natriuretic Peptide 114.0 (H) 0.0 - 100.0 pg/mL    Comment: Performed at [facility], [address] [place], [place] [address]  SARS Coronavirus 2 (CEPHEID - Performed in [facility] hospital lab), Hosp Order     Status: None   Collection Time: 10/22/18  1:48 AM  Result Value Ref Range   SARS Coronavirus 2 NEGATIVE NEGATIVE    Comment: (NOTE) If result is NEGATIVE SARS-CoV-2 target nucleic acids are NOT DETECTED. The SARS-CoV-2 RNA is generally detectable in upper and lower  respiratory specimens during the acute phase of infection. The lowest  concentration of SARS-CoV-2 viral copies this assay can detect is 250  copies / mL. A  negative result does not preclude SARS-CoV-2 infection  and should not  be used as the sole basis for treatment or other  patient management decisions.  A negative result may occur with  improper specimen collection / handling, submission of specimen other  than nasopharyngeal swab, presence of viral mutation(s) within the  areas targeted by this assay, and inadequate number of viral copies  (<250 copies / mL). A negative result must be combined with clinical  observations, patient history, and epidemiological information. If result is POSITIVE SARS-CoV-2 target nucleic acids are DETECTED. The SARS-CoV-2 RNA is generally detectable in upper and lower  respiratory specimens dur ing the acute phase of infection.  Positive  results are indicative of active infection with SARS-CoV-2.  Clinical  correlation with patient history and other diagnostic information is  necessary to determine patient infection status.  Positive results do  not rule out bacterial infection or co-infection with other viruses. If result is PRESUMPTIVE POSTIVE SARS-CoV-2 nucleic acids MAY BE PRESENT.   A presumptive positive result was obtained on the submitted specimen  and confirmed on repeat testing.  While 2019 novel coronavirus  (SARS-CoV-2) nucleic acids may be present in the submitted sample  additional confirmatory testing may be necessary for epidemiological  and / or clinical management purposes  to differentiate between  SARS-CoV-2 and other Sarbecovirus currently known to infect humans.  If clinically indicated additional testing with an alternate test  methodology [phone]) is advised. The SARS-CoV-2 RNA is generally  detectable in upper and lower respiratory sp ecimens during the acute  phase of infection. The expected result is Negative. Fact Sheet for Patients:  BoilerBrush.com.cy Fact Sheet for Healthcare Providers: https://pope.com/ This test is not  yet approved or cleared by the [place] FDA and has been authorized for detection and/or diagnosis of SARS-CoV-2 by FDA under an Emergency Use Authorization (EUA).  This EUA will remain in effect (meaning this test can be used) for the duration of the COVID-19 declaration under Section 564(b)(1) of the Act, 21 U.S.C. section 360bbb-3(b)(1), unless the authorization is terminated or revoked sooner. Performed at [facility], [address] [place], [place] [address]   Urinalysis, Complete w Microscopic     Status: Abnormal   Collection Time: 10/22/18  2:40 AM  Result Value Ref Range   Color, Urine YELLOW (A) YELLOW   APPearance CLEAR (A) CLEAR   Specific Gravity, Urine 1.036 (H) 1.005 - 1.030   pH 5.0 5.0 - 8.0   Glucose, UA >=500 (A) NEGATIVE mg/dL   Hgb urine dipstick SMALL (A) NEGATIVE   Bilirubin Urine NEGATIVE NEGATIVE   Ketones, ur NEGATIVE NEGATIVE mg/dL   Protein, ur NEGATIVE NEGATIVE mg/dL   Nitrite NEGATIVE NEGATIVE   Leukocytes,Ua NEGATIVE NEGATIVE   RBC / HPF 11-20 0 - 5 RBC/hpf   WBC, UA 0-5 0 - 5 WBC/hpf   Bacteria, UA NONE SEEN NONE SEEN   Squamous Epithelial / LPF 0-5 0 - 5   Mucus PRESENT     Comment: Performed at [facility], [address], [place], [place] [address]   Ct Chest Wo Contrast  Result Date: 10/21/2018 CLINICAL DATA:  63 year old male with recently discharged from hospital for pneumonia admission. Worsening pneumonia was seen on chest radiograph. EXAM: CT CHEST WITHOUT CONTRAST TECHNIQUE: Multidetector CT imaging of the chest was performed following the standard protocol without IV contrast. COMPARISON:  Chest radiograph dated 10/21/2018 and CT dated 10/18/2018 FINDINGS: Evaluation of this exam is limited in the absence of intravenous contrast. Cardiovascular: There is no cardiomegaly or pericardial effusion. Multi  vessel coronary vascular calcification with involvement of the LAD, RCA, and left circumflex artery. Mild  atherosclerotic calcification of the thoracic aorta. Mild dilatation of the main pulmonary trunk suggestive of a degree of pulmonary hypertension. Mediastinum/Nodes: There is no hilar or mediastinal adenopathy. The esophagus is grossly unremarkable. No mediastinal fluid collection. Lungs/Pleura: Focal area of scarring in the inferior right middle lobe. The lungs are otherwise clear. There is no pleural effusion or pneumothorax. Overall interval clearance of the previously seen airspace densities. There is a punctate right middle lobe calcified granuloma. The central airways are patent. Upper Abdomen: There is a 14 mm low attenuating nodular density from the anterior aspect of the neck of the pancreas (series 2 image 143. This lesion is not characterized on this CT but appears similar to the CT of 11/29/2013, likely a benign lesion such as a side branch IPMN. There is probable mild fatty infiltration of the liver. Musculoskeletal: Degenerative changes of the spine. No acute osseous pathology. IMPRESSION: 1. No acute intrathoracic pathology. Interval resolution of the previously seen airspace densities. 2. Multi vessel coronary vascular calcification. Electronically Signed   By: Elgie Collard M.D.   On: 10/21/2018 23:55   Ct Angio Chest Pe W And/or Wo Contrast  Result Date: 10/22/2018 CLINICAL DATA:  63 year old male with history of acute onset of shortness of breath. Hypoxia. EXAM: CT ANGIOGRAPHY CHEST WITH CONTRAST TECHNIQUE: Multidetector CT imaging of the chest was performed using the standard protocol during bolus administration of intravenous contrast. Multiplanar CT image reconstructions and MIPs were obtained to evaluate the vascular anatomy. CONTRAST:  75mL OMNIPAQUE IOHEXOL 350 MG/ML SOLN COMPARISON:  Chest CT 10/21/2018. FINDINGS: Cardiovascular: No filling defects within the pulmonary arterial tree to suggest underlying pulmonary embolism. Heart size is normal. There is no significant pericardial  fluid, thickening or pericardial calcification. There is aortic atherosclerosis, as well as atherosclerosis of the great vessels of the mediastinum and the coronary arteries, including calcified atherosclerotic plaque in the left anterior descending and right coronary arteries. Severe thickening calcifications of the aortic valve. Mild calcifications of the mitral annulus/valve. Mediastinum/Nodes: No pathologically enlarged mediastinal or hilar lymph nodes. Esophagus is unremarkable in appearance. No axillary lymphadenopathy. Lungs/Pleura: Mild diffuse ground-glass attenuation in patchy septal thickening in the lungs bilaterally, similar to recent prior examinations, favored to represent mild interstitial pulmonary edema. Nodular area of architectural distortion in the right middle lobe, similar to recent prior examinations, likely to reflect an area of post infectious or inflammatory scarring. No confluent consolidative airspace disease. No pleural effusions. Upper Abdomen: Unremarkable. Musculoskeletal: There are no aggressive appearing lytic or blastic lesions noted in the visualized portions of the skeleton. Review of the MIP images confirms the above findings. IMPRESSION: 1. No evidence of pulmonary embolism on today's examination. 2. Mild cardiomegaly with mild interstitial pulmonary edema; imaging findings suggestive of congestive heart failure. 3. There are calcifications of the aortic valve and mitral valve/annulus. Echocardiographic correlation for evaluation of potential valvular dysfunction may be warranted if clinically indicated. 4. Aortic atherosclerosis, in addition to 2 vessel coronary artery disease. Please note that although the presence of coronary artery calcium documents the presence of coronary artery disease, the severity of this disease and any potential stenosis cannot be assessed on this non-gated CT examination. Assessment for potential risk factor modification, dietary therapy or  pharmacologic therapy may be warranted, if clinically indicated. Aortic Atherosclerosis (ICD10-I70.0). Electronically Signed   By: Trudie Reed M.D.   On: 10/22/2018 01:30   Ct Knee Right Wo  Contrast  Result Date: 10/22/2018 CLINICAL DATA:  63 year old male with right knee replacement presenting with knee pain and swelling after fall. EXAM: CT OF THE right KNEE WITHOUT CONTRAST TECHNIQUE: Multidetector CT imaging of the right knee was performed according to the standard protocol. Multiplanar CT image reconstructions were also generated. COMPARISON:  Right knee radiograph dated 10/18/2018 FINDINGS: Evaluation is limited due to streak artifact caused by metallic knee arthroplasty. Bones/Joint/Cartilage There is a total right knee arthroplasty. The arthroplasty components appear intact and in anatomic alignment. No evidence of hardware loosening. No acute fracture or dislocation identified. There is no significant joint effusion. Ligaments Suboptimally assessed by CT. Muscles and Tendons No intramuscular fluid collection or hematoma. There is moderate fatty atrophy of the musculature. Soft tissues Diffuse subcutaneous soft tissue stranding and edema anterior to the knee and along the anterior vertical surgical incision. This may be inflammatory or infectious in etiology. Clinical correlation is recommended. No definite drainable fluid collection identified. IMPRESSION: 1. No acute fracture or dislocation. 2. Total right knee arthroplasty. No evidence of hardware complication. 3. Diffuse subcutaneous soft tissue stranding and edema anterior to the knee and along the vertical surgical incision. Clinical correlation is recommended to evaluate for infection. No drainable fluid collection identified. Electronically Signed   By: Anner Crete M.D.   On: 10/22/2018 00:02   Dg Chest Portable 1 View  Result Date: 10/21/2018 CLINICAL DATA:  63 year old male with worsening shortness of breath, low oxygen saturations  and fever. EXAM: PORTABLE CHEST 1 VIEW COMPARISON:  Chest x-ray 10/18/2018. FINDINGS: Low lung volumes. Patchy ill-defined opacities and areas of interstitial prominence. No pleural effusions. Crowding of the pulmonary vasculature, without frank pulmonary edema. Heart size is upper limits of normal. Upper mediastinal contours are within normal limits. IMPRESSION: 1. Patchy ill-defined multifocal interstitial and airspace opacities concerning for developing multilobar pneumonia. Electronically Signed   By: Vinnie Langton M.D.   On: 10/21/2018 22:30    Pending Labs Unresulted Labs (From admission, onward)    Start     Ordered   10/23/18 7253  Basic metabolic panel  Tomorrow morning,   STAT     10/22/18 0511   10/23/18 0500  CBC  Tomorrow morning,   STAT     10/22/18 0511   10/22/18 0520  Wound or Superficial Culture  Once,   STAT    Comments:  Right knee wound   Question:  Patient immune status  Answer:  Normal   10/22/18 0520   10/22/18 0506  Hemoglobin A1c  Once,   STAT    Comments:  To assess prior glycemic control    10/22/18 0511   10/21/18 2148  Culture, blood (Routine x 2)  BLOOD CULTURE X 2,   STAT     10/21/18 2148          Vitals/Pain Today's Vitals   10/21/18 2307 10/21/18 2309 10/22/18 0244 10/22/18 0444  BP:   117/67 114/64  Pulse:   81 78  Resp:   18 18  Temp:      TempSrc:      SpO2: 90% 96% 96% 96%  Weight:      Height:      PainSc:   9  9     Isolation Precautions No active isolations  Medications Medications  sodium chloride flush (NS) 0.9 % injection 3 mL (has no administration in time range)  aspirin tablet 81 mg (has no administration in time range)  HYDROcodone-acetaminophen (NORCO/VICODIN) 5-325 MG per tablet 1-2  tablet (has no administration in time range)  doxycycline (VIBRA-TABS) tablet 100 mg (has no administration in time range)  amLODipine (NORVASC) tablet 5 mg (has no administration in time range)  carvedilol (COREG) tablet 6.25 mg (has  no administration in time range)  hydrochlorothiazide (HYDRODIURIL) tablet 12.5 mg (has no administration in time range)  losartan (COZAAR) tablet 100 mg (has no administration in time range)  rosuvastatin (CRESTOR) tablet 20 mg (has no administration in time range)  DULoxetine (CYMBALTA) DR capsule 30 mg (has no administration in time range)  Insulin Degludec SOPN 80 Units (has no administration in time range)  Semaglutide (1 MG/DOSE) SOPN 1 mg (has no administration in time range)  Empagliflozin-metFORMIN HCl 09-998 MG TABS 1 tablet (has no administration in time range)  insulin aspart (novoLOG) injection 0-9 Units (has no administration in time range)  Melatonin TABS 10 mg (has no administration in time range)  gabapentin (NEURONTIN) capsule 900 mg (has no administration in time range)  multivitamin with minerals tablet 1 tablet (has no administration in time range)  enoxaparin (LOVENOX) injection 40 mg (has no administration in time range)  0.9 %  sodium chloride infusion (has no administration in time range)  acetaminophen (TYLENOL) tablet 650 mg (has no administration in time range)    Or  acetaminophen (TYLENOL) suppository 650 mg (has no administration in time range)  traZODone (DESYREL) tablet 25 mg (has no administration in time range)  magnesium hydroxide (MILK OF MAGNESIA) suspension 30 mL (has no administration in time range)  ondansetron (ZOFRAN) tablet 4 mg (has no administration in time range)    Or  ondansetron (ZOFRAN) injection 4 mg (has no administration in time range)  levofloxacin (LEVAQUIN) IVPB 750 mg (has no administration in time range)  levofloxacin (LEVAQUIN) IVPB 750 mg (0 mg Intravenous Stopped 10/22/18 0233)  iohexol (OMNIPAQUE) 350 MG/ML injection 75 mL (75 mLs Intravenous Contrast Given 10/22/18 0028)    Mobility walks with device High fall risk   Focused Assessments Pulmonary Assessment Handoff:  Lung sounds: L Breath Sounds: Diminished R Breath  Sounds: Diminished O2 Device: Nasal Cannula O2 Flow Rate (L/min): 6 L/min      R Recommendations: See Admitting Provider Note  Report given to:   Additional Notes: wife Nathan Dean updated

## 2018-10-22 NOTE — H&P (Signed)
[place] [facility] [place] NAME: Nathan Dean    MR#:  [id number]  DATE OF BIRTH:  01-13-1956  SUBJECTIVE: Charge yesterday, readmitted last night because patient had a fall, also her shortness of breath.  Patient now on 6 L of oxygen, saturation is more than 96%, will wean off oxygen, because of recurrent falls now wife is requesting if we can place him in rehab.  Appears to be confused, patient thinks this is evening when I saw him around 8 AM today morning he thinks it is evening  CHIEF COMPLAINT:   Chief Complaint  Patient presents with  . Leg Injury  . Shortness of Breath    REVIEW OF SYSTEMS:   ROS CONSTITUTIONAL: Fatigue, generalized weakness, EYES: No blurred or double vision.  EARS, NOSE, AND THROAT: No tinnitus or ear pain.  RESPIRATORY: No cough, shortness of breath, wheezing or hemoptysis.  CARDIOVASCULAR: No chest pain, orthopnea, edema.  GASTROINTESTINAL: No nausea, vomiting, diarrhea or abdominal pain.  GENITOURINARY: No dysuria, hematuria.  ENDOCRINE: No polyuria, nocturia,  HEMATOLOGY: No anemia, easy bruising or bleeding SKIN: No rash or lesion. MUSCULOSKELETAL: Right knee swelling, limited range of motion. NEUROLOGIC: No tingling, numbness, weakness.  PSYCHIATRY: No anxiety or depression.   DRUG ALLERGIES:   Allergies  Allergen Reactions  . Penicillins Anaphylaxis, Rash and Other (See Comments)  . Succinylcholine Chloride Anaphylaxis and Rash  . Keflex [Cephalexin] Rash  . Sulfa Antibiotics Rash and Other (See Comments)    VITALS:  Blood pressure (!) 123/53, pulse 79, temperature 98.3 F (36.8 C), temperature source Oral, resp. rate 17, height 6\' 9"  (2.057 m), weight (!) 170.4 kg, SpO2 98 %.  PHYSICAL EXAMINATION:  GENERAL:  63 y.o.-year-old patient lying in the bed with no acute distress.  EYES: Pupils equal, round, reactive to light and accommodation. No scleral icterus. Extraocular muscles intact.   HEENT: Head atraumatic, normocephalic. Oropharynx and nasopharynx clear.  NECK:  Supple, no jugular venous distention. No thyroid enlargement, no tenderness.  LUNGS: Normal breath sounds bilaterally, no wheezing, rales,rhonchi or crepitation. No use of accessory muscles of respiration.  CARDIOVASCULAR: S1, S2 normal. No murmurs, rubs, or gallops.  ABDOMEN: Soft, nontender, nondistended. Bowel sounds present. No organomegaly or mass.  EXTREMITIES: Right knee swelling, limited range of motion. NEUROLOGIC: Cranial nerves II through XII are intact. Muscle strength 5/5 in all extremities. Sensation intact. Gait not checked.  PSYCHIATRIC: The patient is alert and oriented x 3.  SKIN: No obvious rash, lesion, or ulcer.    LABORATORY PANEL:   CBC Recent Labs  Lab 10/21/18 2155  WBC 10.7*  HGB 11.9*  HCT 38.3*  PLT 205   ------------------------------------------------------------------------------------------------------------------  Chemistries  Recent Labs  Lab 10/21/18 0523 10/21/18 2155  NA 143 138  K 4.3 3.6  CL 106 102  CO2 28 27  GLUCOSE 92 172*  BUN 19 19  CREATININE 0.73 0.82  CALCIUM 8.6* 8.4*  MG 2.3  --   AST  --  26  ALT  --  14  ALKPHOS  --  20  BILITOT  --  0.6   ------------------------------------------------------------------------------------------------------------------  Cardiac Enzymes Recent Labs  Lab 10/21/18 2155  TROPONINI <0.03   ------------------------------------------------------------------------------------------------------------------  RADIOLOGY:  Ct Chest Wo Contrast  Result Date: 10/21/2018 CLINICAL DATA:  63 year old male with recently discharged from hospital for pneumonia admission. Worsening pneumonia was seen on chest radiograph. EXAM: CT CHEST WITHOUT CONTRAST TECHNIQUE: Multidetector CT imaging of the chest was performed following the  standard protocol without IV contrast. COMPARISON:  Chest radiograph dated 10/21/2018 and  CT dated 10/18/2018 FINDINGS: Evaluation of this exam is limited in the absence of intravenous contrast. Cardiovascular: There is no cardiomegaly or pericardial effusion. Multi vessel coronary vascular calcification with involvement of the LAD, RCA, and left circumflex artery. Mild atherosclerotic calcification of the thoracic aorta. Mild dilatation of the main pulmonary trunk suggestive of a degree of pulmonary hypertension. Mediastinum/Nodes: There is no hilar or mediastinal adenopathy. The esophagus is grossly unremarkable. No mediastinal fluid collection. Lungs/Pleura: Focal area of scarring in the inferior right middle lobe. The lungs are otherwise clear. There is no pleural effusion or pneumothorax. Overall interval clearance of the previously seen airspace densities. There is a punctate right middle lobe calcified granuloma. The central airways are patent. Upper Abdomen: There is a 14 mm low attenuating nodular density from the anterior aspect of the neck of the pancreas (series 2 image 143. This lesion is not characterized on this CT but appears similar to the CT of 11/29/2013, likely a benign lesion such as a side branch IPMN. There is probable mild fatty infiltration of the liver. Musculoskeletal: Degenerative changes of the spine. No acute osseous pathology. IMPRESSION: 1. No acute intrathoracic pathology. Interval resolution of the previously seen airspace densities. 2. Multi vessel coronary vascular calcification. Electronically Signed   By: Elgie CollardArash  Radparvar M.D.   On: 10/21/2018 23:55   Ct Angio Chest Pe W And/or Wo Contrast  Result Date: 10/22/2018 CLINICAL DATA:  63 year old male with history of acute onset of shortness of breath. Hypoxia. EXAM: CT ANGIOGRAPHY CHEST WITH CONTRAST TECHNIQUE: Multidetector CT imaging of the chest was performed using the standard protocol during bolus administration of intravenous contrast. Multiplanar CT image reconstructions and MIPs were obtained to evaluate the  vascular anatomy. CONTRAST:  75mL OMNIPAQUE IOHEXOL 350 MG/ML SOLN COMPARISON:  Chest CT 10/21/2018. FINDINGS: Cardiovascular: No filling defects within the pulmonary arterial tree to suggest underlying pulmonary embolism. Heart size is normal. There is no significant pericardial fluid, thickening or pericardial calcification. There is aortic atherosclerosis, as well as atherosclerosis of the great vessels of the mediastinum and the coronary arteries, including calcified atherosclerotic plaque in the left anterior descending and right coronary arteries. Severe thickening calcifications of the aortic valve. Mild calcifications of the mitral annulus/valve. Mediastinum/Nodes: No pathologically enlarged mediastinal or hilar lymph nodes. Esophagus is unremarkable in appearance. No axillary lymphadenopathy. Lungs/Pleura: Mild diffuse ground-glass attenuation in patchy septal thickening in the lungs bilaterally, similar to recent prior examinations, favored to represent mild interstitial pulmonary edema. Nodular area of architectural distortion in the right middle lobe, similar to recent prior examinations, likely to reflect an area of post infectious or inflammatory scarring. No confluent consolidative airspace disease. No pleural effusions. Upper Abdomen: Unremarkable. Musculoskeletal: There are no aggressive appearing lytic or blastic lesions noted in the visualized portions of the skeleton. Review of the MIP images confirms the above findings. IMPRESSION: 1. No evidence of pulmonary embolism on today's examination. 2. Mild cardiomegaly with mild interstitial pulmonary edema; imaging findings suggestive of congestive heart failure. 3. There are calcifications of the aortic valve and mitral valve/annulus. Echocardiographic correlation for evaluation of potential valvular dysfunction may be warranted if clinically indicated. 4. Aortic atherosclerosis, in addition to 2 vessel coronary artery disease. Please note that  although the presence of coronary artery calcium documents the presence of coronary artery disease, the severity of this disease and any potential stenosis cannot be assessed on this non-gated CT examination. Assessment for potential  risk factor modification, dietary therapy or pharmacologic therapy may be warranted, if clinically indicated. Aortic Atherosclerosis (ICD10-I70.0). Electronically Signed   By: Trudie Reedaniel  Entrikin M.D.   On: 10/22/2018 01:30   Ct Knee Right Wo Contrast  Result Date: 10/22/2018 CLINICAL DATA:  63 year old male with right knee replacement presenting with knee pain and swelling after fall. EXAM: CT OF THE right KNEE WITHOUT CONTRAST TECHNIQUE: Multidetector CT imaging of the right knee was performed according to the standard protocol. Multiplanar CT image reconstructions were also generated. COMPARISON:  Right knee radiograph dated 10/18/2018 FINDINGS: Evaluation is limited due to streak artifact caused by metallic knee arthroplasty. Bones/Joint/Cartilage There is a total right knee arthroplasty. The arthroplasty components appear intact and in anatomic alignment. No evidence of hardware loosening. No acute fracture or dislocation identified. There is no significant joint effusion. Ligaments Suboptimally assessed by CT. Muscles and Tendons No intramuscular fluid collection or hematoma. There is moderate fatty atrophy of the musculature. Soft tissues Diffuse subcutaneous soft tissue stranding and edema anterior to the knee and along the anterior vertical surgical incision. This may be inflammatory or infectious in etiology. Clinical correlation is recommended. No definite drainable fluid collection identified. IMPRESSION: 1. No acute fracture or dislocation. 2. Total right knee arthroplasty. No evidence of hardware complication. 3. Diffuse subcutaneous soft tissue stranding and edema anterior to the knee and along the vertical surgical incision. Clinical correlation is recommended to  evaluate for infection. No drainable fluid collection identified. Electronically Signed   By: Elgie CollardArash  Radparvar M.D.   On: 10/22/2018 00:02   Dg Chest Portable 1 View  Result Date: 10/21/2018 CLINICAL DATA:  63 year old male with worsening shortness of breath, low oxygen saturations and fever. EXAM: PORTABLE CHEST 1 VIEW COMPARISON:  Chest x-ray 10/18/2018. FINDINGS: Low lung volumes. Patchy ill-defined opacities and areas of interstitial prominence. No pleural effusions. Crowding of the pulmonary vasculature, without frank pulmonary edema. Heart size is upper limits of normal. Upper mediastinal contours are within normal limits. IMPRESSION: 1. Patchy ill-defined multifocal interstitial and airspace opacities concerning for developing multilobar pneumonia. Electronically Signed   By: Trudie Reedaniel  Entrikin M.D.   On: 10/21/2018 22:30    EKG:   Orders placed or performed during the hospital encounter of 10/18/18  . EKG 12-Lead  . EKG 12-Lead  . ED EKG 12-Lead  . ED EKG 12-Lead    ASSESSMENT AND PLAN:  63 year old male with morbid obesity, recently discharged yesterday after he was diagnosed with prepatellar bursitis, sepsis discharged home with doxycycline had a fall again, wife mentioned that she went to pick up doxycycline prescription she came back she  noted that he fell, he looked confused. #1 multiple recurrent falls, right knee contusion, CT of the right knee did not show any hardware infection, right now he is on doxycycline, consult wound care nurse, if patient continues to have pain, swelling will consult Dr. Allena KatzPatel.  Patient was seen by Dr. Ernest PineHooten last time. 2.  Recurrent falls, physical therapy to see the patient: Wife is requesting rehab placement this time. 3.  Morbid obesity, sleep apnea, use CPAP at night 4.  Acute respiratory failure likely due to combination of obesity hypoventilation, clinical pneumonia, continue doxycycline, wean off oxygen to keep saturation more than 90%, also look  at echocardiogram to evaluate for EF.  BNP is within normal range.  I increase the Lasix/same explained to patient's wife.  CT angios chest done did not show any PE. 5.  Depression, patient's wife mentioned that patient has message to  give up, patient brother recently passed away, wife mentions that he is rest, recommend psychiatric consult, wife is okay with that. 6.  Diabetes mellitus type 2: Continue home medicines.   More than 50% time spent in counseling, coordination of care.  Plan to consult wound care, echocardiogram, psychiatric consult, physical therapy evaluation.  I spoke to patient's wife at length, call her at [phone].  Her name is Huel CoteBarbara Vitullo  All the records are reviewed and case discussed with Care Management/Social Workerr. Management plans discussed with the patient, family and they are in agreement.  CODE STATUS: Full code  TOTAL TIME TAKING CARE OF THIS PATIENT: 45 minutes.   POSSIBLE D/C IN 2-3 DAYS, DEPENDING ON CLINICAL CONDITION.   Katha HammingSnehalatha Dominie Benedick M.D on 10/22/2018 at 1:11 PM  Between 7am to 6pm - Pager - [phone]  After 6pm go to [email or web address] - password [email or web address] [facility]  [facility]  Office  [phone]  CC: Primary care physician; Georgina QuintSagardia, Miguel Jose, MD   Note: This dictation was prepared with Dragon dictation along with smaller phrase technology. Any transcriptional errors that result from this process are unintentional.

## 2018-10-22 NOTE — ED Notes (Signed)
Vital signs stable. 

## 2018-10-23 DIAGNOSIS — Z6839 Body mass index (BMI) 39.0-39.9, adult: Secondary | ICD-10-CM | POA: Diagnosis not present

## 2018-10-23 DIAGNOSIS — Z881 Allergy status to other antibiotic agents status: Secondary | ICD-10-CM | POA: Diagnosis not present

## 2018-10-23 DIAGNOSIS — G4733 Obstructive sleep apnea (adult) (pediatric): Secondary | ICD-10-CM

## 2018-10-23 DIAGNOSIS — Z9989 Dependence on other enabling machines and devices: Secondary | ICD-10-CM

## 2018-10-23 DIAGNOSIS — Z7982 Long term (current) use of aspirin: Secondary | ICD-10-CM | POA: Diagnosis not present

## 2018-10-23 DIAGNOSIS — Z20828 Contact with and (suspected) exposure to other viral communicable diseases: Secondary | ICD-10-CM | POA: Diagnosis present

## 2018-10-23 DIAGNOSIS — R0602 Shortness of breath: Secondary | ICD-10-CM | POA: Diagnosis present

## 2018-10-23 DIAGNOSIS — R0902 Hypoxemia: Secondary | ICD-10-CM | POA: Diagnosis not present

## 2018-10-23 DIAGNOSIS — Z882 Allergy status to sulfonamides status: Secondary | ICD-10-CM | POA: Diagnosis not present

## 2018-10-23 DIAGNOSIS — F411 Generalized anxiety disorder: Secondary | ICD-10-CM

## 2018-10-23 DIAGNOSIS — E1142 Type 2 diabetes mellitus with diabetic polyneuropathy: Secondary | ICD-10-CM | POA: Diagnosis present

## 2018-10-23 DIAGNOSIS — Z87891 Personal history of nicotine dependence: Secondary | ICD-10-CM | POA: Diagnosis not present

## 2018-10-23 DIAGNOSIS — J189 Pneumonia, unspecified organism: Secondary | ICD-10-CM | POA: Diagnosis present

## 2018-10-23 DIAGNOSIS — E662 Morbid (severe) obesity with alveolar hypoventilation: Secondary | ICD-10-CM | POA: Diagnosis present

## 2018-10-23 DIAGNOSIS — S8001XA Contusion of right knee, initial encounter: Secondary | ICD-10-CM | POA: Diagnosis present

## 2018-10-23 DIAGNOSIS — S8002XA Contusion of left knee, initial encounter: Secondary | ICD-10-CM | POA: Diagnosis present

## 2018-10-23 DIAGNOSIS — L03115 Cellulitis of right lower limb: Secondary | ICD-10-CM | POA: Diagnosis present

## 2018-10-23 DIAGNOSIS — Z794 Long term (current) use of insulin: Secondary | ICD-10-CM | POA: Diagnosis not present

## 2018-10-23 DIAGNOSIS — F329 Major depressive disorder, single episode, unspecified: Secondary | ICD-10-CM | POA: Diagnosis present

## 2018-10-23 DIAGNOSIS — R296 Repeated falls: Secondary | ICD-10-CM | POA: Diagnosis present

## 2018-10-23 DIAGNOSIS — J9601 Acute respiratory failure with hypoxia: Secondary | ICD-10-CM | POA: Diagnosis present

## 2018-10-23 DIAGNOSIS — Y92003 Bedroom of unspecified non-institutional (private) residence as the place of occurrence of the external cause: Secondary | ICD-10-CM | POA: Diagnosis not present

## 2018-10-23 DIAGNOSIS — Z96653 Presence of artificial knee joint, bilateral: Secondary | ICD-10-CM | POA: Diagnosis present

## 2018-10-23 DIAGNOSIS — I5033 Acute on chronic diastolic (congestive) heart failure: Secondary | ICD-10-CM | POA: Diagnosis present

## 2018-10-23 DIAGNOSIS — W06XXXA Fall from bed, initial encounter: Secondary | ICD-10-CM | POA: Diagnosis present

## 2018-10-23 DIAGNOSIS — Z888 Allergy status to other drugs, medicaments and biological substances status: Secondary | ICD-10-CM | POA: Diagnosis not present

## 2018-10-23 DIAGNOSIS — I110 Hypertensive heart disease with heart failure: Secondary | ICD-10-CM | POA: Diagnosis present

## 2018-10-23 DIAGNOSIS — Z880 Allergy status to penicillin: Secondary | ICD-10-CM | POA: Diagnosis not present

## 2018-10-23 DIAGNOSIS — K219 Gastro-esophageal reflux disease without esophagitis: Secondary | ICD-10-CM | POA: Diagnosis present

## 2018-10-23 DIAGNOSIS — E785 Hyperlipidemia, unspecified: Secondary | ICD-10-CM | POA: Diagnosis present

## 2018-10-23 DIAGNOSIS — S80211A Abrasion, right knee, initial encounter: Secondary | ICD-10-CM | POA: Diagnosis present

## 2018-10-23 LAB — GLUCOSE, CAPILLARY
Glucose-Capillary: 61 mg/dL — ABNORMAL LOW (ref 70–99)
Glucose-Capillary: 71 mg/dL (ref 70–99)
Glucose-Capillary: 175 mg/dL — ABNORMAL HIGH (ref 70–99)
Glucose-Capillary: 174 mg/dL — ABNORMAL HIGH (ref 70–99)
Glucose-Capillary: 220 mg/dL — ABNORMAL HIGH (ref 70–99)

## 2018-10-23 LAB — CBC
HCT: 36.8 % — ABNORMAL LOW (ref 39.0–52.0)
Hemoglobin: 11.5 g/dL — ABNORMAL LOW (ref 13.0–17.0)
MCH: 27.6 pg (ref 26.0–34.0)
MCHC: 31.3 g/dL (ref 30.0–36.0)
MCV: 88.2 fL (ref 80.0–100.0)
Platelets: 219 10*3/uL (ref 150–400)
RBC: 4.17 MIL/uL — ABNORMAL LOW (ref 4.22–5.81)
RDW: 14.6 % (ref 11.5–15.5)
WBC: 7.6 10*3/uL (ref 4.0–10.5)
nRBC: 0 % (ref 0.0–0.2)

## 2018-10-23 LAB — CULTURE, BLOOD (ROUTINE X 2)
Culture: NO GROWTH
Culture: NO GROWTH

## 2018-10-23 LAB — BASIC METABOLIC PANEL
Anion gap: 9 (ref 5–15)
BUN: 19 mg/dL (ref 8–23)
CO2: 30 mmol/L (ref 22–32)
Calcium: 8.3 mg/dL — ABNORMAL LOW (ref 8.9–10.3)
Chloride: 103 mmol/L (ref 98–111)
Creatinine, Ser: 0.7 mg/dL (ref 0.61–1.24)
GFR calc Af Amer: >60 mL/min (ref >60)
GFR calc non Af Amer: >60 mL/min (ref >60)
Glucose, Bld: 93 mg/dL (ref 70–99)
Potassium: 3 mmol/L — ABNORMAL LOW (ref 3.5–5.1)
Sodium: 142 mmol/L (ref 135–145)

## 2018-10-23 LAB — HEMOGLOBIN A1C
Hgb A1c MFr Bld: 7.4 % — ABNORMAL HIGH (ref 4.8–5.6)
Mean Plasma Glucose: 165.68 mg/dL

## 2018-10-23 LAB — ECHOCARDIOGRAM COMPLETE
Height: 81 in
Weight: 6009.6 oz

## 2018-10-23 MED ORDER — INSULIN GLARGINE 100 UNIT/ML ~~LOC~~ SOLN
58.0000 [IU] | Freq: Every day | SUBCUTANEOUS | Status: DC
  Administered 2018-10-23: 21:00:00 58 [IU] via SUBCUTANEOUS
  Filled 2018-10-23 (×2): qty 0.58

## 2018-10-23 MED ORDER — DOCUSATE SODIUM 100 MG PO CAPS
100.0000 mg | ORAL_CAPSULE | Freq: Two times a day (BID) | ORAL | Status: DC
  Administered 2018-10-23 – 2018-10-26 (×7): 100 mg via ORAL
  Filled 2018-10-23 (×7): qty 1

## 2018-10-23 MED ORDER — BUPROPION HCL ER (XL) 150 MG PO TB24
150.0000 mg | ORAL_TABLET | Freq: Every day | ORAL | Status: DC
  Administered 2018-10-23 – 2018-10-26 (×4): 150 mg via ORAL
  Filled 2018-10-23 (×4): qty 1

## 2018-10-23 MED ORDER — POTASSIUM CHLORIDE CRYS ER 20 MEQ PO TBCR
40.0000 meq | EXTENDED_RELEASE_TABLET | Freq: Once | ORAL | Status: AC
  Administered 2018-10-23: 40 meq via ORAL
  Filled 2018-10-23: qty 2

## 2018-10-23 MED ORDER — SODIUM CHLORIDE 0.9 % IV SOLN
INTRAVENOUS | Status: DC | PRN
  Administered 2018-10-23 – 2018-10-24 (×2): 250 mL via INTRAVENOUS

## 2018-10-23 NOTE — Progress Notes (Signed)
PT Cancellation Note  Patient Details Name: Nathan Dean MRN: [id number] DOB: Oct 19, 1955   Cancelled Treatment:    Reason Eval/Treat Not Completed: Other (comment)   Pt offered session this am but declined due to pain and needing L elbow dressed due to bloody drainage.  Returned later in pm as request.  Pt sleeping but awoke easily and did not want to participate at this time due to fatigue.  Requested to return later in the day.  Told pt that it was getting late in the day and I was unsure if we could do that.  He requested to try tomorrow.   Chesley Noon 10/23/2018, 4:03 PM

## 2018-10-23 NOTE — TOC Progression Note (Signed)
Transition of Care [facility]) - Progression Note    Patient Details  Name: Jasani Dolney MRN: [id number] Date of Birth: 13-Sep-1955  [facility] CM/SW Contact  Ross Ludwig, [place] Phone Number: 10/23/2018, 4:41 PM  Clinical Narrative:     Patient was presented bed offers, and he chose [facility].  CSW contacted [facility] and asked if they could start insurance auth, they were going to start authorization today because it can take 2-3 days.   Expected Discharge Plan: [place] Barriers to Discharge: Continued Medical Work up, Ship broker  Expected Discharge Plan and Services Expected Discharge Plan: [place]   Discharge Planning Services: CM Consult Post Acute Care Choice: [place] Living arrangements for the past 2 months: Single Family Home Expected Discharge Date: 10/23/18               DME Arranged: N/A DME Agency: NA       HH Arranged: NA HH Agency: NA         Social Determinants of Health (SDOH) Interventions    Readmission Risk Interventions Readmission Risk Prevention Plan 10/21/2018  Post Dischage Appt Complete  Medication Screening Complete  Transportation Screening Complete  Some recent data might be hidden

## 2018-10-23 NOTE — Consult Note (Signed)
[facility]   Reason for Consult: Depression Referring Physician: Dr. Luberta MutterKonidena Patient Identification: Nathan MottGregory Dean MRN:  [id number] Principal Diagnosis: Hypoxia Diagnosis:  Principal Problem:   Hypoxia Active Problems:   Depression   Total Time spent with patient: 1 hour  Subjective: "I have been really depressed."  HPI:  Nathan Dean is a 63 y.o. male patient admitted with shortness of breath and leg pain.  Patient was recently discharged after he was diagnosed with prepatellar bursitis, sepsis discharged home with doxycycline had a fall again, wife mentioned that she went to pick up doxycycline prescription she came back she  noted that he fell, he looked confused.  Patient has had multiple falls, now recommending skilled nursing facility for rehabilitation.  Patient has morbid obesity with sleep apnea using CPAP at night. Acute respiratory failure likely due to combination of obesity hypoventilation, clinical pneumonia, continue doxycycline, ct angio chest negative for PE, patient hypoxia resolved, spoke with patient's wife about CT angios chest results, CT of the knee results.  Patient's wife mentioned that patient keeps telling her that he is giving up, feels very depressed.  Patient also has medical history of  Diabetes mellitus type 2 and acute on chronic diastolic heart failure, improved with IV Lasix, echo showed EF more than 50%.   Psychiatry consult is requested for assistance with management of depression.  On evaluation, patient is awake and alert, calm and cooperative in bed.  He reports significant depression, describing low energy and "feeling blah, with no light at the end of the tunnel.  Patient reports his appetite has been fine.  He reports his sleep is adequate as long as he is wearing his CPAP.  He has lost interest in doing things that are enjoyable, and has felt frustrated that he has been unable to work after being laid off in January  and now having COVID quarantine restrictions.  He finds it difficult to move about, partly due to his mood and partly due to his medical comorbidities.  Patient denies suicidal ideation.  He is denying HI or auditory and visual hallucinations.  He has no history of mania.  He has no history of psychosis.  Patient does not abuse alcohol or drugs.  He is not under the care of a psychiatrist or a counselor, but is open to both.  He is prescribed Cymbalta currently by his primary care provider but believes that that was added for pain management and neuropathy.  Patient does not have access to weapons.  Past Psychiatric History: Depressed mood  Risk to Self:  Denies Risk to Others:  Denies Prior Inpatient Therapy:  Denies Prior Outpatient Therapy:  Denies  Patient has provided consent to speak with wife Britta MccreedyBarbara [phone]).  We will plan to attempt collateral.  Past Medical History:  Past Medical History:  Diagnosis Date  . Diabetes (HCC)   . GERD (gastroesophageal reflux disease)   . Hyperlipidemia   . Hypertension   . Sleep apnea     Past Surgical History:  Procedure Laterality Date  . CARPAL TUNNEL RELEASE Right 12/28/2016   Procedure: RIGHT ULNAR AND MEDIAN NEUROPLASTY AT WRIST;  Surgeon: Mack Hookhompson, David, MD;  Location: [facility];  Service: Orthopedics;  Laterality: Right;  . 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 Ovarian cancer Mother   . Kidney failure Father   . Cancer Brother  Family Psychiatric  History: no family history   Social History:  Social History   Substance and Sexual Activity  Alcohol Use Yes  . Alcohol/week: 0.0 standard drinks   Comment: socially     Social History   Substance and Sexual Activity  Drug Use No    Social History   Socioeconomic History  . Marital status: Married    Spouse name: Not on file  . Number of children: 0  . Years  of education: Not on file  . Highest education level: Associate degree: academic program  Occupational History  . Occupation: Currently Working   Social Needs  . Financial resource strain: Not on file  . Food insecurity:    Worry: Not on file    Inability: Not on file  . Transportation needs:    Medical: Not on file    Non-medical: Not on file  Tobacco Use  . Smoking status: Former Smoker    Packs/day: 2.00    Years: 20.00    Pack years: 40.00    Types: Cigarettes    Last attempt to quit: 06/28/1996    Years since quitting: 22.3  . Smokeless tobacco: Never Used  Substance and Sexual Activity  . Alcohol use: Yes    Alcohol/week: 0.0 standard drinks    Comment: socially  . Drug use: No  . Sexual activity: Not on file  Lifestyle  . Physical activity:    Days per week: Not on file    Minutes per session: Not on file  . Stress: Not on file  Relationships  . Social connections:    Talks on phone: Not on file    Gets together: Not on file    Attends religious service: Not on file    Active member of club or organization: Not on file    Attends meetings of clubs or organizations: Not on file    Relationship status: Not on file  Other Topics Concern  . Not on file  Social History Narrative   Right handed    Lives at home spouse    Caffeine 5 -6 cups     Additional Social History:    Works as Health and safety inspectorpurchasing agent Associates degree and experience in [employer]  Married 2004-2009 -divorced.  Doing temp contract work for 8 years, Last with [employer] and laid off January 2020.  Wife is Comptrollerlibrarian at [employer]. Married x 7 years. 2 adult step-daughters.  No legal issues.  Quit smoking 20+ years ago Alcohol 1/week 2-3 glasses No illicit drugs    Allergies:   Allergies  Allergen Reactions  . Penicillins Anaphylaxis, Rash and Other (See Comments)  . Succinylcholine Chloride Anaphylaxis and Rash  . Keflex [Cephalexin] Rash  . Sulfa Antibiotics  Rash and Other (See Comments)    Labs:  Results for orders placed or performed during the hospital encounter of 10/21/18 (from the past 48 hour(s))  Comprehensive metabolic panel     Status: Abnormal   Collection Time: 10/21/18  9:55 PM  Result Value Ref Range   Sodium 138 135 - 145 mmol/L   Potassium 3.6 3.5 - 5.1 mmol/L   Chloride 102 98 - 111 mmol/L   CO2 27 22 - 32 mmol/L   Glucose, Bld 172 (H) 70 - 99 mg/dL   BUN 19 8 - 23 mg/dL   Creatinine, Ser 1.610.82 0.61 - 1.24 mg/dL   Calcium 8.4 (L) 8.9 - 10.3 mg/dL   Total Protein 6.7 6.5 - 8.1 g/dL  Albumin 3.0 (L) 3.5 - 5.0 g/dL   AST 26 15 - 41 U/L   ALT 14 0 - 44 U/L   Alkaline Phosphatase 57 38 - 126 U/L   Total Bilirubin 0.6 0.3 - 1.2 mg/dL   GFR calc non Af Amer >60 >60 mL/min   GFR calc Af Amer >60 >60 mL/min   Anion gap 9 5 - 15    Comment: Performed at [facility], [address] [place], [place] [address]  Lactic acid, plasma     Status: None   Collection Time: 10/21/18  9:55 PM  Result Value Ref Range   Lactic Acid, Venous 1.4 0.5 - 1.9 mmol/L    Comment: Performed at [facility], [address] [place], [place] [address]  CBC with Differential     Status: Abnormal   Collection Time: 10/21/18  9:55 PM  Result Value Ref Range   WBC 10.7 (H) 4.0 - 10.5 K/uL   RBC 4.29 4.22 - 5.81 MIL/uL   Hemoglobin 11.9 (L) 13.0 - 17.0 g/dL   HCT 14.738.3 (L) 82.939.0 - 56.252.0 %   MCV 89.3 80.0 - 100.0 fL   MCH 27.7 26.0 - 34.0 pg   MCHC 31.1 30.0 - 36.0 g/dL   RDW 13.014.5 86.511.5 - 78.415.5 %   Platelets 205 150 - 400 K/uL   nRBC 0.0 0.0 - 0.2 %   Neutrophils Relative % 72 %   Neutro Abs 7.7 1.7 - 7.7 K/uL   Lymphocytes Relative 11 %   Lymphs Abs 1.2 0.7 - 4.0 K/uL   Monocytes Relative 10 %   Monocytes Absolute 1.0 0.1 - 1.0 K/uL   Eosinophils Relative 6 %   Eosinophils Absolute 0.7 (H) 0.0 - 0.5 K/uL   Basophils Relative 1 %   Basophils Absolute 0.1 0.0 - 0.1 K/uL   Immature Granulocytes 0 %   Abs Immature  Granulocytes 0.04 0.00 - 0.07 K/uL    Comment: Performed at [facility], [address] [place], [place] [address]  Protime-INR     Status: None   Collection Time: 10/21/18  9:55 PM  Result Value Ref Range   Prothrombin Time 13.5 11.4 - 15.2 seconds   INR 1.0 0.8 - 1.2    Comment: (NOTE) INR goal varies based on device and disease states. Performed at [facility], [address] [place], [place] [address]   Culture, blood (Routine x 2)     Status: None (Preliminary result)   Collection Time: 10/21/18  9:55 PM  Result Value Ref Range   Specimen Description BLOOD BLOOD LEFT FOREARM    Special Requests      BOTTLES DRAWN AEROBIC AND ANAEROBIC Blood Culture results may not be optimal due to an excessive volume of blood received in culture bottles   Culture      NO GROWTH 2 DAYS Performed at [facility], [address] [place], [place] [address]    Report Status PENDING   Culture, blood (Routine x 2)     Status: None (Preliminary result)   Collection Time: 10/21/18  9:55 PM  Result Value Ref Range   Specimen Description BLOOD RIGHT ANTECUBITAL    Special Requests      BOTTLES DRAWN AEROBIC AND ANAEROBIC Blood Culture results may not be optimal due to an excessive volume of blood received in culture bottles   Culture      NO GROWTH 2 DAYS Performed at [facility], [address]  [place], [place] [address]    Report Status PENDING   Troponin I - Add-On to previous collection     Status: None   Collection Time: 10/21/18  9:55 PM  Result Value Ref Range   Troponin I <0.03 <0.03 ng/mL    Comment: Performed at [facility], [address] [place], [place] [address]  Brain natriuretic peptide     Status: Abnormal   Collection Time: 10/21/18  9:55 PM  Result Value Ref Range   B Natriuretic Peptide 114.0 (H) 0.0 - 100.0 pg/mL    Comment: Performed at [facility], [address] [place], [place] [address]  SARS  Coronavirus 2 (CEPHEID - Performed in [facility] hospital lab), Hosp Order     Status: None   Collection Time: 10/22/18  1:48 AM  Result Value Ref Range   SARS Coronavirus 2 NEGATIVE NEGATIVE    Comment: (NOTE) If result is NEGATIVE SARS-CoV-2 target nucleic acids are NOT DETECTED. The SARS-CoV-2 RNA is generally detectable in upper and lower  respiratory specimens during the acute phase of infection. The lowest  concentration of SARS-CoV-2 viral copies this assay can detect is 250  copies / mL. A negative result does not preclude SARS-CoV-2 infection  and should not be used as the sole basis for treatment or other  patient management decisions.  A negative result may occur with  improper specimen collection / handling, submission of specimen other  than nasopharyngeal swab, presence of viral mutation(s) within the  areas targeted by this assay, and inadequate number of viral copies  (<250 copies / mL). A negative result must be combined with clinical  observations, patient history, and epidemiological information. If result is POSITIVE SARS-CoV-2 target nucleic acids are DETECTED. The SARS-CoV-2 RNA is generally detectable in upper and lower  respiratory specimens dur ing the acute phase of infection.  Positive  results are indicative of active infection with SARS-CoV-2.  Clinical  correlation with patient history and other diagnostic information is  necessary to determine patient infection status.  Positive results do  not rule out bacterial infection or co-infection with other viruses. If result is PRESUMPTIVE POSTIVE SARS-CoV-2 nucleic acids MAY BE PRESENT.   A presumptive positive result was obtained on the submitted specimen  and confirmed on repeat testing.  While 2019 novel coronavirus  (SARS-CoV-2) nucleic acids may be present in the submitted sample  additional confirmatory testing may be necessary for epidemiological  and / or clinical management purposes  to  differentiate between  SARS-CoV-2 and other Sarbecovirus currently known to infect humans.  If clinically indicated additional testing with an alternate test  methodology [phone]) is advised. The SARS-CoV-2 RNA is generally  detectable in upper and lower respiratory sp ecimens during the acute  phase of infection. The expected result is Negative. Fact Sheet for Patients:  BoilerBrush.com.cy Fact Sheet for Healthcare Providers: https://pope.com/ This test is not yet approved or cleared by the [place] FDA and has been authorized for detection and/or diagnosis of SARS-CoV-2 by FDA under an Emergency Use Authorization (EUA).  This EUA will remain in effect (meaning this test can be used) for the duration of the COVID-19 declaration under Section 564(b)(1) of the Act, 21 U.S.C. section 360bbb-3(b)(1), unless the authorization is terminated or revoked sooner. Performed at [facility], [address] [place], [place] [address]   Urinalysis, Complete w Microscopic     Status: Abnormal   Collection Time: 10/22/18  2:40 AM  Result Value Ref Range  Color, Urine YELLOW (A) YELLOW   APPearance CLEAR (A) CLEAR   Specific Gravity, Urine 1.036 (H) 1.005 - 1.030   pH 5.0 5.0 - 8.0   Glucose, UA >=500 (A) NEGATIVE mg/dL   Hgb urine dipstick SMALL (A) NEGATIVE   Bilirubin Urine NEGATIVE NEGATIVE   Ketones, ur NEGATIVE NEGATIVE mg/dL   Protein, ur NEGATIVE NEGATIVE mg/dL   Nitrite NEGATIVE NEGATIVE   Leukocytes,Ua NEGATIVE NEGATIVE   RBC / HPF 11-20 0 - 5 RBC/hpf   WBC, UA 0-5 0 - 5 WBC/hpf   Bacteria, UA NONE SEEN NONE SEEN   Squamous Epithelial / LPF 0-5 0 - 5   Mucus PRESENT     Comment: Performed at [facility], [address] [place], [place] [address]  Wound or Superficial Culture     Status: None (Preliminary result)   Collection Time: 10/22/18  2:40 AM  Result Value Ref Range   Specimen Description       WOUND RIGHT KNEE Performed at [facility], [address], [place], [place] [address]    Special Requests      Normal Performed at [facility], [address] [place], [place] [address]    Gram Stain NO WBC SEEN NO ORGANISMS SEEN     Culture      RARE STAPHYLOCOCCUS AUREUS SUSCEPTIBILITIES TO FOLLOW Performed at [facility], [address], [place], [place] [address]    Report Status PENDING   Hemoglobin A1c     Status: Abnormal   Collection Time: 10/22/18  7:09 AM  Result Value Ref Range   Hgb A1c MFr Bld 7.4 (H) 4.8 - 5.6 %    Comment: (NOTE) Pre diabetes:          5.7%-6.4% Diabetes:              >6.4% Glycemic control for   <7.0% adults with diabetes    Mean Plasma Glucose 165.68 mg/dL    Comment: Performed at [facility], [address], [place], [place] [address]  Glucose, capillary     Status: Abnormal   Collection Time: 10/22/18  7:48 AM  Result Value Ref Range   Glucose-Capillary 119 (H) 70 - 99 mg/dL  Glucose, capillary     Status: Abnormal   Collection Time: 10/22/18 11:40 AM  Result Value Ref Range   Glucose-Capillary 138 (H) 70 - 99 mg/dL  Glucose, capillary     Status: Abnormal   Collection Time: 10/22/18  4:40 PM  Result Value Ref Range   Glucose-Capillary 128 (H) 70 - 99 mg/dL  Glucose, capillary     Status: Abnormal   Collection Time: 10/22/18 10:01 PM  Result Value Ref Range   Glucose-Capillary 286 (H) 70 - 99 mg/dL  Basic metabolic panel     Status: Abnormal   Collection Time: 10/23/18  5:30 AM  Result Value Ref Range   Sodium 142 135 - 145 mmol/L   Potassium 3.0 (L) 3.5 - 5.1 mmol/L   Chloride 103 98 - 111 mmol/L   CO2 30 22 - 32 mmol/L   Glucose, Bld 93 70 - 99 mg/dL   BUN 19 8 - 23 mg/dL   Creatinine, Ser 1.30 0.61 - 1.24 mg/dL   Calcium 8.3 (L) 8.9 - 10.3 mg/dL   GFR calc non Af Amer >60 >60 mL/min   GFR calc Af Amer >60 >60 mL/min   Anion gap 9 5 - 15    Comment: Performed at [facility],  [address] [place], [place] [address]  CBC     Status: Abnormal   Collection Time: 10/23/18  5:30 AM  Result Value Ref Range   WBC 7.6 4.0 - 10.5 K/uL   RBC 4.17 (L) 4.22 - 5.81 MIL/uL   Hemoglobin 11.5 (L) 13.0 - 17.0 g/dL   HCT 60.4 (L) 54.0 - 98.1 %   MCV 88.2 80.0 - 100.0 fL   MCH 27.6 26.0 - 34.0 pg   MCHC 31.3 30.0 - 36.0 g/dL   RDW 19.1 47.8 - 29.5 %   Platelets 219 150 - 400 K/uL   nRBC 0.0 0.0 - 0.2 %    Comment: Performed at [facility], [address] [place], [place] [address]  Glucose, capillary     Status: Abnormal   Collection Time: 10/23/18  7:53 AM  Result Value Ref Range   Glucose-Capillary 61 (L) 70 - 99 mg/dL  Glucose, capillary     Status: None   Collection Time: 10/23/18  8:28 AM  Result Value Ref Range   Glucose-Capillary 71 70 - 99 mg/dL  Glucose, capillary     Status: Abnormal   Collection Time: 10/23/18 11:35 AM  Result Value Ref Range   Glucose-Capillary 175 (H) 70 - 99 mg/dL    Current Facility-Administered Medications  Medication Dose Route Frequency Provider Last Rate Last Dose  . 0.9 %  sodium chloride infusion   Intravenous PRN Katha Hamming, MD 10 mL/hr at 10/23/18 0315    . acetaminophen (TYLENOL) tablet 650 mg  650 mg Oral Q6H PRN Mansy, Jan A, MD       Or  . acetaminophen (TYLENOL) suppository 650 mg  650 mg Rectal Q6H PRN Mansy, Jan A, MD      . amLODipine (NORVASC) tablet 5 mg  5 mg Oral Daily Mansy, Jan A, MD   5 mg at 10/23/18 0841  . aspirin EC tablet 81 mg  81 mg Oral Daily Mansy, Jan A, MD   81 mg at 10/23/18 0839  . carvedilol (COREG) tablet 6.25 mg  6.25 mg Oral BID WC Mansy, Jan A, MD   6.25 mg at 10/23/18 0839  . docusate sodium (COLACE) capsule 100 mg  100 mg Oral BID Katha Hamming, MD   100 mg at 10/23/18 1605  . doxycycline (VIBRA-TABS) tablet 100 mg  100 mg Oral Q12H Mansy, Jan A, MD   100 mg at 10/23/18 0841  . DULoxetine (CYMBALTA) DR capsule 30 mg  30 mg Oral Daily Mansy, Jan A, MD   30 mg at  10/23/18 0841  . enoxaparin (LOVENOX) injection 40 mg  40 mg Subcutaneous BID Mansy, Jan A, MD   40 mg at 10/23/18 0841  . furosemide (LASIX) injection 40 mg  40 mg Intravenous BID Katha Hamming, MD   40 mg at 10/23/18 0841  . gabapentin (NEURONTIN) capsule 900 mg  900 mg Oral TID Mansy, Jan A, MD   900 mg at 10/23/18 1605  . HYDROcodone-acetaminophen (NORCO/VICODIN) 5-325 MG per tablet 1-2 tablet  1-2 tablet Oral Q4H PRN Mansy, Vernetta Honey, MD   2 tablet at 10/22/18 2209  . insulin aspart (novoLOG) injection 0-9 Units  0-9 Units Subcutaneous TID WC Mansy, Vernetta Honey, MD   2 Units at 10/23/18 1226  . insulin glargine (LANTUS) injection 58 Units  58 Units Subcutaneous QHS Katha Hamming, MD      . levofloxacin (LEVAQUIN) IVPB 750 mg  750 mg Intravenous Q24H Mansy, Jan A,  MD   Stopped at 10/23/18 0251  . losartan (COZAAR) tablet 100 mg  100 mg Oral Daily Mansy, Jan A, MD   100 mg at 10/23/18 0840  . magnesium hydroxide (MILK OF MAGNESIA) suspension 30 mL  30 mL Oral Daily PRN Mansy, Jan A, MD      . Melatonin TABS 10 mg  10 mg Oral QHS PRN Mansy, Jan A, MD      . multivitamin with minerals tablet 1 tablet  1 tablet Oral Daily Mansy, Jan A, MD   1 tablet at 10/23/18 0840  . ondansetron (ZOFRAN) tablet 4 mg  4 mg Oral Q6H PRN Mansy, Jan A, MD       Or  . ondansetron [facility]) injection 4 mg  4 mg Intravenous Q6H PRN Mansy, Jan A, MD      . rosuvastatin (CRESTOR) tablet 20 mg  20 mg Oral Daily Mansy, Jan A, MD   20 mg at 10/22/18 1725  . sodium chloride flush (NS) 0.9 % injection 3 mL  3 mL Intravenous Once Arnaldo Natal, MD      . traZODone (DESYREL) tablet 25 mg  25 mg Oral QHS PRN Mansy, Vernetta Honey, MD        Musculoskeletal: Strength & Muscle Tone: decreased Gait & Station: Not assessed Patient leans: N/A  Psychiatric Specialty Exam: Physical Exam  Nursing note and vitals reviewed. Constitutional: He is oriented to person, place, and time. He appears well-developed. No distress.  Obese   HENT:  Head: Normocephalic and atraumatic.  Eyes: EOM are normal.  Neck: Normal range of motion.  Cardiovascular: Normal rate and regular rhythm.  Respiratory: Effort normal. No respiratory distress.  Musculoskeletal: Normal range of motion.  Neurological: He is alert and oriented to person, place, and time.    Review of Systems  Constitutional: Negative.   Respiratory: Negative for shortness of breath.   Cardiovascular: Positive for leg swelling. Negative for chest pain and palpitations.  Musculoskeletal: Positive for joint pain and myalgias.  Neurological: Positive for weakness.  Psychiatric/Behavioral: Positive for depression. Negative for hallucinations, memory loss, substance abuse and suicidal ideas. The patient is nervous/anxious and has insomnia.     Blood pressure (!) 125/55, pulse 79, temperature 99.1 F (37.3 C), temperature source Axillary, resp. rate 18, height  (2.057 m), weight (!) 167.3 kg, SpO2 94 %.Body mass index is 39.52 kg/m.  General Appearance: Casual  Eye Contact:  Good  Speech:  Clear and Coherent and Normal Rate  Volume:  Normal  Mood:  Anxious and Depressed  Affect:  Congruent  Thought Process:  Coherent, Goal Directed and Descriptions of Associations: Intact  Orientation:  Full (Time, Place, and Person)  Thought Content:  Logical and Hallucinations: None  Suicidal Thoughts:  No  Homicidal Thoughts:  No  Memory:  Good  Judgement:  Good  Insight:  Good  Psychomotor Activity:  Normal  Concentration:  Concentration: Good  Recall:  Good  Fund of Knowledge:  Good  Language:  Good  Akathisia:  No  Handed:  Right  AIMS (if indicated):     Assets:  Communication Skills Desire for Improvement Financial Resources/Insurance Housing Intimacy Resilience Social Support  ADL's:  Impaired, requires assistance  Cognition:  WNL  Sleep:   Adequate with CPAP     Treatment Plan Summary: Daily contact with patient to assess and evaluate symptoms and  progress in treatment and Medication management  Continue Cymbalta 30 mg daily for depression and neuropathy Start Wellbutrin XL  150 mg daily for depression Continue other medications as ordered for medical comorbidities Continue CPAP for OSA   Disposition: No evidence of imminent risk to self or others at present.   Supportive therapy provided about ongoing stressors.  Plan to contact wife for collateral information and for discharge planning to outpatient psychiatrist and therapy.  Patient is interested in starting psychotherapy.  Will review with wife how to find providers.   Lavella Hammock, MD 10/23/2018 4:10 PM

## 2018-10-23 NOTE — Plan of Care (Signed)
  Problem: Education: Goal: Knowledge of General Education information will improve Description: Including pain rating scale, medication(s)/side effects and non-pharmacologic comfort measures Outcome: Progressing   Problem: Clinical Measurements: Goal: Respiratory complications will improve Outcome: Progressing   Problem: Safety: Goal: Ability to remain free from injury will improve Outcome: Progressing   

## 2018-10-23 NOTE — Consult Note (Signed)
ORTHOPAEDIC CONSULTATION  PATIENT NAME: Nathan Dean DOB: 01-Jul-1955  MRN: [id number]  REQUESTING PHYSICIAN: Epifanio Lesches, MD  Chief Complaint: Right knee pain  HPI: Nathan Dean is a 63 y.o. male who complains of right knee pain and swelling.  The patient had been admitted last week after a fall when he sustained an abrasion to the anterior aspect of the right knee.  He had developed a localized cellulitis and was treated with antibiotics.  He was subsequently discharged from the hospital to home, but sustained 2 additional falls, both times involving the right lower extremity.  At his previous admission he was noted to have an atypical pneumonia with evidence of hypoxia and generalized weakness.  Although his breathing has improved, he still reports generalized weakness and fatigue.  He denies any fevers or chills.  He reports bruising to the pretibial region as well as soreness to the area of the quadriceps tendon.  Of note, the patient had his first right total knee arthroplasty in [facility] in 1992.  In February 2014 he had a right total knee revision arthroplasty for loosening.  He had done well until the recent falls.  He was not typically exercising on a regular basis.  Past Medical History:  Diagnosis Date  . Diabetes ([place])   . GERD (gastroesophageal reflux disease)   . Hyperlipidemia   . Hypertension   . Sleep apnea    Past Surgical History:  Procedure Laterality Date  . CARPAL TUNNEL RELEASE Right 12/28/2016   Procedure: RIGHT ULNAR AND MEDIAN NEUROPLASTY AT WRIST;  Surgeon: Milly Jakob, MD;  Location: [facility];  Service: Orthopedics;  Laterality: Right;  . REPLACEMENT TOTAL KNEE BILATERAL  2014   left 2012 rt 2014   Social History   Socioeconomic History  . Marital status: Married    Spouse name: Not on file  . Number of children: 0  . Years of education: Not on file  . Highest education level: Associate degree: academic program   Occupational History  . Occupation: Currently Working   Social Needs  . Financial resource strain: Not on file  . Food insecurity:    Worry: Not on file    Inability: Not on file  . Transportation needs:    Medical: Not on file    Non-medical: Not on file  Tobacco Use  . Smoking status: Former Smoker    Packs/day: 2.00    Years: 20.00    Pack years: 40.00    Types: Cigarettes    Last attempt to quit: 06/28/1996    Years since quitting: 22.3  . Smokeless tobacco: Never Used  Substance and Sexual Activity  . Alcohol use: Yes    Alcohol/week: 0.0 standard drinks    Comment: socially  . Drug use: No  . Sexual activity: Not on file  Lifestyle  . Physical activity:    Days per week: Not on file    Minutes per session: Not on file  . Stress: Not on file  Relationships  . Social connections:    Talks on phone: Not on file    Gets together: Not on file    Attends religious service: Not on file    Active member of club or organization: Not on file    Attends meetings of clubs or organizations: Not on file    Relationship status: Not on file  Other Topics Concern  . Not on file  Social History Narrative   Right handed    Lives at home  spouse    Caffeine 5 -6 cups     Family History  Problem Relation Age of Onset  . Ovarian cancer Mother   . Kidney failure Father   . Cancer Brother    Allergies  Allergen Reactions  . Penicillins Anaphylaxis, Rash and Other (See Comments)  . Succinylcholine Chloride Anaphylaxis and Rash  . Keflex [Cephalexin] Rash  . Sulfa Antibiotics Rash and Other (See Comments)   Prior to Admission medications   Medication Sig Start Date End Date Taking? Authorizing Provider  amLODipine (NORVASC) 10 MG tablet Take 0.5 tablets (5 mg total) by mouth daily. 11/15/17  Yes Sagardia, Ines Bloomer, MD  aspirin 81 MG tablet Take 1 tablet (81 mg total) by mouth daily. 08/11/16  Yes Karamalegos, Devonne Doughty, DO  Blood Glucose Monitoring Suppl (ONE TOUCH ULTRA  SYSTEM KIT) w/Device KIT 1 kit by Does not apply route once. Dx. E11.9 07/28/15  Yes Arlis Porta., MD  carvedilol (COREG) 6.25 MG tablet TAKE 1 TABLET BY MOUTH TWICE A DAY WITH FOOD 10/21/18  Yes Sagardia, Ines Bloomer, MD  doxycycline (VIBRA-TABS) 100 MG tablet Take 1 tablet (100 mg total) by mouth every 12 (twelve) hours. 10/21/18  Yes Wieting, Richard, MD  DULoxetine (CYMBALTA) 30 MG capsule Take 30 mg by mouth daily. 07/24/18  Yes [provider]  gabapentin (NEURONTIN) 300 MG capsule Take 3 capsules (900 mg total) by mouth 3 (three) times daily. 10/21/18  Yes Wieting, Richard, MD  glucose blood test strip 1 each by Other route 4 (four) times daily. Use with One touch meter to check blood sugar. Dx E11.9 09/13/16  Yes Mikey College, NP  hydrochlorothiazide (HYDRODIURIL) 12.5 MG tablet TAKE 1 TABLET BY MOUTH EVERY DAY Patient taking differently: Take 12.5 mg by mouth daily.  06/15/18  Yes Sagardia, Ines Bloomer, MD  HYDROcodone-acetaminophen (NORCO/VICODIN) 5-325 MG tablet Take 1-2 tablets by mouth every 4 (four) hours as needed for moderate pain or severe pain. 10/21/18  Yes Wieting, Richard, MD  Insulin Degludec 200 UNIT/ML SOPN Inject 80 Units into the skin at bedtime.    Yes [provider]  Insulin Pen Needle (BD PEN NEEDLE NANO U/F) 32G X 4 MM MISC Inject 1 each as directed 3 (three) times daily. 04/18/18  Yes Stallings, Zoe A, MD  losartan (COZAAR) 100 MG tablet TAKE 1 TABLET BY MOUTH EVERY DAY Patient taking differently: Take 100 mg by mouth daily.  08/16/18  Yes Sagardia, Ines Bloomer, MD  Melatonin 10 MG TABS Take 10 mg by mouth at bedtime as needed (sleep).   Yes [provider]  Multiple Vitamin (MULTIVITAMIN WITH MINERALS) TABS tablet Take 1 tablet by mouth daily.   Yes [provider]  omeprazole (PRILOSEC) 20 MG capsule Take 20 mg by mouth daily.    Yes [provider]  ONE TOUCH LANCETS MISC 1 each by Does not apply route 4 (four) times  daily. Use with one touch meter to check blood sugar. Dx: E11.9 09/16/16  Yes Mikey College, NP  rosuvastatin (CRESTOR) 20 MG tablet Take 20 mg by mouth daily.   Yes [provider]  Semaglutide (OZEMPIC, 1 MG/DOSE, [place]) Inject 1 mg into the skin once a week.    Yes [provider]  SYNJARDY 09-998 MG TABS Take 1 tablet by mouth 2 (two) times daily with a meal. 07/06/18  Yes [provider]   Ct Chest Wo Contrast  Result Date: 10/21/2018 CLINICAL DATA:  63 year old male with recently  discharged from hospital for pneumonia admission. Worsening pneumonia was seen on chest radiograph. EXAM: CT CHEST WITHOUT CONTRAST TECHNIQUE: Multidetector CT imaging of the chest was performed following the standard protocol without IV contrast. COMPARISON:  Chest radiograph dated 10/21/2018 and CT dated 10/18/2018 FINDINGS: Evaluation of this exam is limited in the absence of intravenous contrast. Cardiovascular: There is no cardiomegaly or pericardial effusion. Multi vessel coronary vascular calcification with involvement of the LAD, RCA, and left circumflex artery. Mild atherosclerotic calcification of the thoracic aorta. Mild dilatation of the main pulmonary trunk suggestive of a degree of pulmonary hypertension. Mediastinum/Nodes: There is no hilar or mediastinal adenopathy. The esophagus is grossly unremarkable. No mediastinal fluid collection. Lungs/Pleura: Focal area of scarring in the inferior right middle lobe. The lungs are otherwise clear. There is no pleural effusion or pneumothorax. Overall interval clearance of the previously seen airspace densities. There is a punctate right middle lobe calcified granuloma. The central airways are patent. Upper Abdomen: There is a 14 mm low attenuating nodular density from the anterior aspect of the neck of the pancreas (series 2 image 143. This lesion is not characterized on this CT but appears similar to the CT of 11/29/2013, likely a benign  lesion such as a side branch IPMN. There is probable mild fatty infiltration of the liver. Musculoskeletal: Degenerative changes of the spine. No acute osseous pathology. IMPRESSION: 1. No acute intrathoracic pathology. Interval resolution of the previously seen airspace densities. 2. Multi vessel coronary vascular calcification. Electronically Signed   By: Anner Crete M.D.   On: 10/21/2018 23:55   Ct Angio Chest Pe W And/or Wo Contrast  Result Date: 10/22/2018 CLINICAL DATA:  63 year old male with history of acute onset of shortness of breath. Hypoxia. EXAM: CT ANGIOGRAPHY CHEST WITH CONTRAST TECHNIQUE: Multidetector CT imaging of the chest was performed using the standard protocol during bolus administration of intravenous contrast. Multiplanar CT image reconstructions and MIPs were obtained to evaluate the vascular anatomy. CONTRAST:  52m OMNIPAQUE IOHEXOL 350 MG/ML SOLN COMPARISON:  Chest CT 10/21/2018. FINDINGS: Cardiovascular: No filling defects within the pulmonary arterial tree to suggest underlying pulmonary embolism. Heart size is normal. There is no significant pericardial fluid, thickening or pericardial calcification. There is aortic atherosclerosis, as well as atherosclerosis of the great vessels of the mediastinum and the coronary arteries, including calcified atherosclerotic plaque in the left anterior descending and right coronary arteries. Severe thickening calcifications of the aortic valve. Mild calcifications of the mitral annulus/valve. Mediastinum/Nodes: No pathologically enlarged mediastinal or hilar lymph nodes. Esophagus is unremarkable in appearance. No axillary lymphadenopathy. Lungs/Pleura: Mild diffuse ground-glass attenuation in patchy septal thickening in the lungs bilaterally, similar to recent prior examinations, favored to represent mild interstitial pulmonary edema. Nodular area of architectural distortion in the right middle lobe, similar to recent prior examinations,  likely to reflect an area of post infectious or inflammatory scarring. No confluent consolidative airspace disease. No pleural effusions. Upper Abdomen: Unremarkable. Musculoskeletal: There are no aggressive appearing lytic or blastic lesions noted in the visualized portions of the skeleton. Review of the MIP images confirms the above findings. IMPRESSION: 1. No evidence of pulmonary embolism on today's examination. 2. Mild cardiomegaly with mild interstitial pulmonary edema; imaging findings suggestive of congestive heart failure. 3. There are calcifications of the aortic valve and mitral valve/annulus. Echocardiographic correlation for evaluation of potential valvular dysfunction may be warranted if clinically indicated. 4. Aortic atherosclerosis, in addition to 2 vessel coronary artery disease. Please note that although the presence of coronary artery  calcium documents the presence of coronary artery disease, the severity of this disease and any potential stenosis cannot be assessed on this non-gated CT examination. Assessment for potential risk factor modification, dietary therapy or pharmacologic therapy may be warranted, if clinically indicated. Aortic Atherosclerosis (ICD10-I70.0). Electronically Signed   By: Vinnie Langton M.D.   On: 10/22/2018 01:30   Ct Knee Right Wo Contrast  Result Date: 10/22/2018 CLINICAL DATA:  63 year old male with right knee replacement presenting with knee pain and swelling after fall. EXAM: CT OF THE right KNEE WITHOUT CONTRAST TECHNIQUE: Multidetector CT imaging of the right knee was performed according to the standard protocol. Multiplanar CT image reconstructions were also generated. COMPARISON:  Right knee radiograph dated 10/18/2018 FINDINGS: Evaluation is limited due to streak artifact caused by metallic knee arthroplasty. Bones/Joint/Cartilage There is a total right knee arthroplasty. The arthroplasty components appear intact and in anatomic alignment. No evidence of  hardware loosening. No acute fracture or dislocation identified. There is no significant joint effusion. Ligaments Suboptimally assessed by CT. Muscles and Tendons No intramuscular fluid collection or hematoma. There is moderate fatty atrophy of the musculature. Soft tissues Diffuse subcutaneous soft tissue stranding and edema anterior to the knee and along the anterior vertical surgical incision. This may be inflammatory or infectious in etiology. Clinical correlation is recommended. No definite drainable fluid collection identified. IMPRESSION: 1. No acute fracture or dislocation. 2. Total right knee arthroplasty. No evidence of hardware complication. 3. Diffuse subcutaneous soft tissue stranding and edema anterior to the knee and along the vertical surgical incision. Clinical correlation is recommended to evaluate for infection. No drainable fluid collection identified. Electronically Signed   By: Anner Crete M.D.   On: 10/22/2018 00:02   Dg Chest Portable 1 View  Result Date: 10/21/2018 CLINICAL DATA:  63 year old male with worsening shortness of breath, low oxygen saturations and fever. EXAM: PORTABLE CHEST 1 VIEW COMPARISON:  Chest x-ray 10/18/2018. FINDINGS: Low lung volumes. Patchy ill-defined opacities and areas of interstitial prominence. No pleural effusions. Crowding of the pulmonary vasculature, without frank pulmonary edema. Heart size is upper limits of normal. Upper mediastinal contours are within normal limits. IMPRESSION: 1. Patchy ill-defined multifocal interstitial and airspace opacities concerning for developing multilobar pneumonia. Electronically Signed   By: Vinnie Langton M.D.   On: 10/21/2018 22:30    Positive ROS: All other systems have been reviewed and were otherwise negative with the exception of those mentioned in the HPI and as above.  Physical Exam: General: Well developed, well nourished male seen in no acute distress. HEENT: Atraumatic and normocephalic. Sclera are  clear. Extraocular motion is intact. Oropharynx is clear. Neck: Supple, nontender, good range of motion. No JVD or carotid bruits. Lungs: Clear to auscultation bilaterally. Cardiovascular: Regular rate and rhythm with normal S1 and S2. No murmurs. No gallops or rubs. Pedal pulses are palpable bilaterally. Homans test is negative bilaterally. No significant pretibial or ankle edema. Abdomen: Soft, nontender, and nondistended. Skin: See below. Neurologic: Awake, alert, and oriented. Sensory function is grossly intact. Motor strength is felt to be 5 over 5 bilaterally. No clonus or tremor. Good motor coordination. Lymphatic: No axillary or cervical lymphadenopathy  MUSCULOSKELETAL: Examination of the right knee demonstrates an area of ecchymosis to the prepatellar region.  To localized areas are noted with scant bloody drainage.  No gross erythema.  No knee effusion.  The patient is able to perform an independent straight leg raise.  The knee is stable to varus and valgus stress.  There  is generalized tenderness to the anterior aspect of the knee and distal quadriceps musculature.  No palpable defect is appreciated to the quadriceps.  Assessment: Multiple contusions to the left knee with ecchymosis Quadriceps atrophy  Plan: Findings were discussed in detail with the patient.  I have asked him to instruct his wife to bring the [facility] so we may continue with cold therapy to the knee so as to decrease the soft tissue swelling.  Physical therapy attempted to work with the patient today and will try again tomorrow.  I have suggested use of a knee immobilizer when the patient is out of bed so as to prevent buckling of the knee.  I put a new dressing on the right knee wound.  Continue with local wound care and cold therapy.  James P. Holley Bouche M.D.

## 2018-10-23 NOTE — Plan of Care (Signed)
  Problem: Education: Goal: Knowledge of General Education information will improve Description: Including pain rating scale, medication(s)/side effects and non-pharmacologic comfort measures Outcome: Progressing   Problem: Clinical Measurements: Goal: Diagnostic test results will improve Outcome: Progressing Goal: Respiratory complications will improve Outcome: Progressing   Problem: Activity: Goal: Risk for activity intolerance will decrease Outcome: Progressing   Problem: Pain Managment: Goal: General experience of comfort will improve Outcome: Progressing   Problem: Safety: Goal: Ability to remain free from injury will improve Outcome: Progressing   

## 2018-10-23 NOTE — Consult Note (Addendum)
[facility] Nurse wound consult note Reason for Consult: Consult requested for right knee.  Pt had previous surgery with hardware in place and recently fell.  He has a large area of bruising to right kneecap; approx 10X10cm reddish-purple yellow, with generalized edema and erythremia, painful to touch.  Ortho team is following for assessment and plan of care to site.  CT scan indicates soft tissue swelling. Requested to recommend topical treatment.  Wound type: 3 full thickness wounds to middle of bruised area; approx 2X4cm affected area, largest wound is 1X1X.3cm when probed with a swab, appearance is consistent with probable  dark reddish tan drainage old blood.  Edges of raised bruised area beginning to blister and peel in patchy areas to the edges, no odor.  Dressing procedure/placement/frequency:  Continue to refer to ortho service for further plan of care related to hardware underneath the surface of the wound. Xeroform gauze to promote drying and healing of wound. Please re-consult if further assistance is needed.  Thank-you,  Julien Girt MSN, [place], [place], [place], [place]

## 2018-10-23 NOTE — Progress Notes (Signed)
Hypoglycemic Event  CBG: 61  Treatment:4oz juice, breakfast ordered  Symptoms: none   Follow-up CBG: Time:0820 CBG Result:71  Possible Reasons for Event: pt was asleep on bipap  Comments/MD notified MD at bedside     Feliberto Gottron

## 2018-10-23 NOTE — TOC Initial Note (Addendum)
Transition of Care [facility]) - Initial/Assessment Note    Patient Details  Name: Nathan Dean MRN: [id number] Date of Birth: 07-11-1955  Transition of Care [facility] CM/SW Contact:    Ross Ludwig, LCSW Phone Number: 10/23/2018, 4:37 PM  Clinical Narrative:                  Patient is a 63 year old male who is married, alert and oriented x4.  Patient states that he has been to rehab in the past, and was at [facility].  Patient stated that he would prefer to return, since he felt like they did a good job getting patient the rehab he needed.  Patient was explained how insurance will pay for stay, and what to expect at SNF.  Patient stated he was familiar with the process.  Patient is hopeful that he will not have to be in rehab for very long.  Patient gave CSW permission to begin bed search in [place].  Patient did not express any other questions or concerns.  Expected Discharge Plan: Skilled Nursing Facility Barriers to Discharge: Continued Medical Work up, Ship broker   Patient Goals and CMS Choice Patient states their goals for this hospitalization and ongoing recovery are:: Plan to go to SNF for some rehab, then return back home. CMS Medicare.gov Compare Post Acute Care list provided to:: Patient Choice offered to / list presented to : Patient  Expected Discharge Plan and Services Expected Discharge Plan: [place]   Discharge Planning Services: CM Consult Post Acute Care Choice: [place] Living arrangements for the past 2 months: Single Family Home Expected Discharge Date: 10/23/18               DME Arranged: N/A DME Agency: NA       HH Arranged: NA [place] Agency: NA        Prior Living Arrangements/Services Living arrangements for the past 2 months: Single Family Home Lives with:: Spouse Patient language and need for interpreter reviewed:: Yes        Need for Family Participation in Patient Care: No  (Comment) Care giver support system in place?: Yes (comment) Current home services: DME Criminal Activity/Legal Involvement Pertinent to Current Situation/Hospitalization: No - Comment as needed  Activities of Daily Living Home Assistive Devices/Equipment: CPAP, Cane (specify quad or straight), Eyeglasses, Dentures (specify type), CBG Meter, Scales ADL Screening (condition at time of admission) Patient's cognitive ability adequate to safely complete daily activities?: Yes Is the patient deaf or have difficulty hearing?: No Does the patient have difficulty seeing, even when wearing glasses/contacts?: No Does the patient have difficulty concentrating, remembering, or making decisions?: No Patient able to express need for assistance with ADLs?: Yes Does the patient have difficulty dressing or bathing?: No Independently performs ADLs?: Yes (appropriate for developmental age) Does the patient have difficulty walking or climbing stairs?: Yes Weakness of Legs: Both Weakness of Arms/Hands: Both  Permission Sought/Granted Permission sought to share information with : Case Manager, Customer service manager, Family Supports Permission granted to share information with : Yes, Verbal Permission Granted  Share Information with NAME: [place], [place] [phone]   Permission granted to share info w AGENCY: SNF admissions        Emotional Assessment Appearance:: Appears stated age Attitude/Demeanor/Rapport: Engaged Affect (typically observed): Accepting, Hopeful, Stable, Appropriate Orientation: : Oriented to Self, Oriented to Place, Oriented to  Time, Oriented to Situation Alcohol / Substance Use: Never Used Psych Involvement: No (comment)  Admission diagnosis:  Weakness [R53.1] Hypoxia [R09.02] Healthcare-associated pneumonia [J18.9] Chronic pain of right knee [M25.561, G89.29] Patient Active Problem List   Diagnosis Date Noted  . Hypoxia 10/22/2018  .  Infection of right prepatellar bursa 10/18/2018  . Sepsis (HCC) 10/18/2018  . Chest congestion 07/20/2017  . Viral illness 07/20/2017  . Lower respiratory infection 07/12/2017  . Laryngitis, acute 07/12/2017  . Cough 07/12/2017  . Pleurisy 07/12/2017  . Depression 06/03/2015  . HTN (hypertension) 01/02/2015  . Hyperlipidemia 01/02/2015  . 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 Sleep apnea 09/21/2010  . Arthritis, degenerative 02/17/2009  . Hereditary and idiopathic peripheral neuropathy 11/21/2008  . AD (atopic dermatitis) 07/08/2008  . Acid reflux 04/05/2007   PCP:  Georgina QuintSagardia, Miguel Jose, MD Pharmacy:   [facility] [phone][id number] - [place], [place] - [address] [place] [address] Phone: [phone] Fax: [phone]     Social Determinants of Health (SDOH) Interventions    Readmission Risk Interventions Readmission Risk Prevention Plan 10/21/2018  Post Dischage Appt Complete  Medication Screening Complete  Transportation Screening Complete  Some recent data might be hidden

## 2018-10-23 NOTE — H&P (Signed)
[facility] [place] [facility]   PATIENT NAME: Nathan MottGregory Dean    MR#:  [id number]  DATE OF BIRTH:  06/19/1955  SUBJECTIVE: Still has significant right knee pain.  Hypoxia resolving, was on CPAP last night.  CHIEF COMPLAINT:   Chief Complaint  Patient presents with  . Leg Injury  . Shortness of Breath    REVIEW OF SYSTEMS:   Review of Systems  Constitutional: Negative for chills and fever.  HENT: Negative for hearing loss.   Eyes: Negative for blurred vision, double vision and photophobia.  Respiratory: Negative for cough, hemoptysis and shortness of breath.   Cardiovascular: Negative for palpitations, orthopnea and leg swelling.  Gastrointestinal: Negative for abdominal pain, diarrhea and vomiting.  Genitourinary: Negative for dysuria and urgency.  Musculoskeletal: Positive for joint pain. Negative for back pain, myalgias and neck pain.  Skin: Negative for rash.  Neurological: Negative for dizziness, focal weakness, seizures, weakness and headaches.  Psychiatric/Behavioral: Negative for memory loss. The patient does not have insomnia.       DRUG ALLERGIES:   Allergies  Allergen Reactions  . Penicillins Anaphylaxis, Rash and Other (See Comments)  . Succinylcholine Chloride Anaphylaxis and Rash  . Keflex [Cephalexin] Rash  . Sulfa Antibiotics Rash and Other (See Comments)    VITALS:  Blood pressure (!) 125/55, pulse 79, temperature 99.1 F (37.3 C), temperature source Axillary, resp. rate 18, height 6\' 9"  (2.057 m), weight (!) 167.3 kg, SpO2 94 %.  PHYSICAL EXAMINATION:  GENERAL:  63 y.o.-year-old patient lying in the bed with no acute distress.  EYES: Pupils equal, round, reactive to light and accommodation. No scleral icterus. Extraocular muscles intact.  HEENT: Head atraumatic, normocephalic. Oropharynx and nasopharynx clear.  NECK:  Supple, no jugular venous distention. No thyroid enlargement, no tenderness.  LUNGS: Normal breath  sounds bilaterally, no wheezing, rales,rhonchi or crepitation. No use of accessory muscles of respiration.  CARDIOVASCULAR: S1, S2 normal. No murmurs, rubs, or gallops.  ABDOMEN: Soft, nontender, nondistended. Bowel sounds present. No organomegaly or mass.  EXTREMITIES: Right knee swelling, limited range of motion. NEUROLOGIC: Cranial nerves II through XII are intact. Muscle strength 5/5 in all extremities. Sensation intact. Gait not checked.  PSYCHIATRIC: The patient is alert and oriented x 3.  SKIN: No obvious rash, lesion, or ulcer.    LABORATORY PANEL:   CBC Recent Labs  Lab 10/23/18 0530  WBC 7.6  HGB 11.5*  HCT 36.8*  PLT 219   ------------------------------------------------------------------------------------------------------------------  Chemistries  Recent Labs  Lab 10/21/18 0523 10/21/18 2155 10/23/18 0530  NA 143 138 142  K 4.3 3.6 3.0*  CL 106 102 103  CO2 28 27 30   GLUCOSE 92 172* 93  BUN 19 19 19   CREATININE 0.73 0.82 0.70  CALCIUM 8.6* 8.4* 8.3*  MG 2.3  --   --   AST  --  26  --   ALT  --  14  --   ALKPHOS  --  57  --   BILITOT  --  0.6  --    ------------------------------------------------------------------------------------------------------------------  Cardiac Enzymes Recent Labs  Lab 10/21/18 2155  TROPONINI <0.03   ------------------------------------------------------------------------------------------------------------------  RADIOLOGY:  Ct Chest Wo Contrast  Result Date: 10/21/2018 CLINICAL DATA:  63 year old male with recently discharged from hospital for pneumonia admission. Worsening pneumonia was seen on chest radiograph. EXAM: CT CHEST WITHOUT CONTRAST TECHNIQUE: Multidetector CT imaging of the chest was performed following the standard protocol without IV contrast. COMPARISON:  Chest radiograph dated 10/21/2018 and  CT dated 10/18/2018 FINDINGS: Evaluation of this exam is limited in the absence of intravenous contrast.  Cardiovascular: There is no cardiomegaly or pericardial effusion. Multi vessel coronary vascular calcification with involvement of the LAD, RCA, and left circumflex artery. Mild atherosclerotic calcification of the thoracic aorta. Mild dilatation of the main pulmonary trunk suggestive of a degree of pulmonary hypertension. Mediastinum/Nodes: There is no hilar or mediastinal adenopathy. The esophagus is grossly unremarkable. No mediastinal fluid collection. Lungs/Pleura: Focal area of scarring in the inferior right middle lobe. The lungs are otherwise clear. There is no pleural effusion or pneumothorax. Overall interval clearance of the previously seen airspace densities. There is a punctate right middle lobe calcified granuloma. The central airways are patent. Upper Abdomen: There is a 14 mm low attenuating nodular density from the anterior aspect of the neck of the pancreas (series 2 image 143. This lesion is not characterized on this CT but appears similar to the CT of 11/29/2013, likely a benign lesion such as a side branch IPMN. There is probable mild fatty infiltration of the liver. Musculoskeletal: Degenerative changes of the spine. No acute osseous pathology. IMPRESSION: 1. No acute intrathoracic pathology. Interval resolution of the previously seen airspace densities. 2. Multi vessel coronary vascular calcification. Electronically Signed   By: Anner Crete M.D.   On: 10/21/2018 23:55   Ct Angio Chest Pe W And/or Wo Contrast  Result Date: 10/22/2018 CLINICAL DATA:  63 year old male with history of acute onset of shortness of breath. Hypoxia. EXAM: CT ANGIOGRAPHY CHEST WITH CONTRAST TECHNIQUE: Multidetector CT imaging of the chest was performed using the standard protocol during bolus administration of intravenous contrast. Multiplanar CT image reconstructions and MIPs were obtained to evaluate the vascular anatomy. CONTRAST:  62mL OMNIPAQUE IOHEXOL 350 MG/ML SOLN COMPARISON:  Chest CT 10/21/2018.  FINDINGS: Cardiovascular: No filling defects within the pulmonary arterial tree to suggest underlying pulmonary embolism. Heart size is normal. There is no significant pericardial fluid, thickening or pericardial calcification. There is aortic atherosclerosis, as well as atherosclerosis of the great vessels of the mediastinum and the coronary arteries, including calcified atherosclerotic plaque in the left anterior descending and right coronary arteries. Severe thickening calcifications of the aortic valve. Mild calcifications of the mitral annulus/valve. Mediastinum/Nodes: No pathologically enlarged mediastinal or hilar lymph nodes. Esophagus is unremarkable in appearance. No axillary lymphadenopathy. Lungs/Pleura: Mild diffuse ground-glass attenuation in patchy septal thickening in the lungs bilaterally, similar to recent prior examinations, favored to represent mild interstitial pulmonary edema. Nodular area of architectural distortion in the right middle lobe, similar to recent prior examinations, likely to reflect an area of post infectious or inflammatory scarring. No confluent consolidative airspace disease. No pleural effusions. Upper Abdomen: Unremarkable. Musculoskeletal: There are no aggressive appearing lytic or blastic lesions noted in the visualized portions of the skeleton. Review of the MIP images confirms the above findings. IMPRESSION: 1. No evidence of pulmonary embolism on today's examination. 2. Mild cardiomegaly with mild interstitial pulmonary edema; imaging findings suggestive of congestive heart failure. 3. There are calcifications of the aortic valve and mitral valve/annulus. Echocardiographic correlation for evaluation of potential valvular dysfunction may be warranted if clinically indicated. 4. Aortic atherosclerosis, in addition to 2 vessel coronary artery disease. Please note that although the presence of coronary artery calcium documents the presence of coronary artery disease, the  severity of this disease and any potential stenosis cannot be assessed on this non-gated CT examination. Assessment for potential risk factor modification, dietary therapy or pharmacologic therapy may be warranted, if  clinically indicated. Aortic Atherosclerosis (ICD10-I70.0). Electronically Signed   By: Trudie Reedaniel  Entrikin M.D.   On: 10/22/2018 01:30   Ct Knee Right Wo Contrast  Result Date: 10/22/2018 CLINICAL DATA:  63 year old male with right knee replacement presenting with knee pain and swelling after fall. EXAM: CT OF THE right KNEE WITHOUT CONTRAST TECHNIQUE: Multidetector CT imaging of the right knee was performed according to the standard protocol. Multiplanar CT image reconstructions were also generated. COMPARISON:  Right knee radiograph dated 10/18/2018 FINDINGS: Evaluation is limited due to streak artifact caused by metallic knee arthroplasty. Bones/Joint/Cartilage There is a total right knee arthroplasty. The arthroplasty components appear intact and in anatomic alignment. No evidence of hardware loosening. No acute fracture or dislocation identified. There is no significant joint effusion. Ligaments Suboptimally assessed by CT. Muscles and Tendons No intramuscular fluid collection or hematoma. There is moderate fatty atrophy of the musculature. Soft tissues Diffuse subcutaneous soft tissue stranding and edema anterior to the knee and along the anterior vertical surgical incision. This may be inflammatory or infectious in etiology. Clinical correlation is recommended. No definite drainable fluid collection identified. IMPRESSION: 1. No acute fracture or dislocation. 2. Total right knee arthroplasty. No evidence of hardware complication. 3. Diffuse subcutaneous soft tissue stranding and edema anterior to the knee and along the vertical surgical incision. Clinical correlation is recommended to evaluate for infection. No drainable fluid collection identified. Electronically Signed   By: Elgie CollardArash  Radparvar  M.D.   On: 10/22/2018 00:02   Dg Chest Portable 1 View  Result Date: 10/21/2018 CLINICAL DATA:  63 year old male with worsening shortness of breath, low oxygen saturations and fever. EXAM: PORTABLE CHEST 1 VIEW COMPARISON:  Chest x-ray 10/18/2018. FINDINGS: Low lung volumes. Patchy ill-defined opacities and areas of interstitial prominence. No pleural effusions. Crowding of the pulmonary vasculature, without frank pulmonary edema. Heart size is upper limits of normal. Upper mediastinal contours are within normal limits. IMPRESSION: 1. Patchy ill-defined multifocal interstitial and airspace opacities concerning for developing multilobar pneumonia. Electronically Signed   By: Trudie Reedaniel  Entrikin M.D.   On: 10/21/2018 22:30    EKG:   Orders placed or performed during the hospital encounter of 10/18/18  . EKG 12-Lead  . EKG 12-Lead  . ED EKG 12-Lead  . ED EKG 12-Lead  . EKG    ASSESSMENT AND PLAN:  63 year old male with morbid obesity, recently discharged yesterday after he was diagnosed with prepatellar bursitis, sepsis discharged home with doxycycline had a fall again, wife mentioned that she went to pick up doxycycline prescription she came back she  noted that he fell, he looked confused. #1 multiple recurrent falls, right knee contusion, CT of the right knee did not show any hardware infection, right now he is on doxycycline,  Will consult Dr. Ernest PineHooten again,  Right kneecap bruise, seen by wound care nurse, dressing changes as per them.  2.  Recurrent falls,  seen by physical therapy again, recommending SNF.   3.  Morbid obesity, sleep apnea, use CPAP at night 4.  Acute respiratory failure likely due to combination of obesity hypoventilation, clinical pneumonia, continue doxycycline, ct angio chest negative for PE, patient hypoxia resolved, spoke with patient's wife about CT angios chest results, CT of the knee results.    5.  Depression, patient's wife mentioned that patient keeps telling her  that that he is giving up, feels very depressed psychiatric consult requested.   6.  Diabetes mellitus type 2: Blood sugar this morning, patient asymptomatic.  Continue home medicines. #7/  acute on chronic diastolic heart failure, improved with IV Lasix, echo showed EF more than 50%.  More than 50% time spent in counseling, coordination of care.   All the records are reviewed and case discussed with Care Management/Social Workerr. Management plans discussed with the patient, family and they are in agreement.  CODE STATUS: Full code  TOTAL TIME TAKING CARE OF THIS PATIENT: 45 minutes.   POSSIBLE D/C IN 2-3 DAYS, DEPENDING ON CLINICAL CONDITION.   Katha HammingSnehalatha Charlize Hathaway M.D on 10/23/2018 at 12:32 PM  Between 7am to 6pm - Pager - [phone]  After 6pm go to [email or web address] - password [email or web address] [facility]  [facility]  Office  [phone]  CC: Primary care physician; Georgina QuintSagardia, Miguel Jose, MD   Note: This dictation was prepared with Dragon dictation along with smaller phrase technology. Any transcriptional errors that result from this process are unintentional.

## 2018-10-23 NOTE — [place] FL2 (Signed)
[place] MEDICAID FL2 LEVEL OF CARE SCREENING TOOL     IDENTIFICATION  Patient Name: Nathan Dean Birthdate: 03/19/1956 Sex: male Admission Date (Current Location): 10/21/2018  [place]   Facility and Address:  [facility], [address], [place], [place] [address]      Provider Number: [id number]  Attending Physician Name and Address:  Katha HammingKonidena, Snehalatha, MD  Relative Name and Phone Number:  Tiney RougeSeibold,Barbara R Spouse [phone]     Current Level of Care: Hospital Recommended Level of Care: Skilled Nursing Facility Prior Approval Number:    Date Approved/Denied:   PASRR Number: [id number]  Discharge Plan: SNF    Current Diagnoses: Patient Active Problem List   Diagnosis Date Noted  . Hypoxia 10/22/2018  . Infection of right prepatellar bursa 10/18/2018  . Sepsis (HCC) 10/18/2018  . Chest congestion 07/20/2017  . Viral illness 07/20/2017  . Lower respiratory infection 07/12/2017  . Laryngitis, acute 07/12/2017  . Cough 07/12/2017  . Pleurisy 07/12/2017  . Depression 06/03/2015  . HTN (hypertension) 01/02/2015  . Hyperlipidemia 01/02/2015  . 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 Sleep apnea 09/21/2010  . Arthritis, degenerative 02/17/2009  . Hereditary and idiopathic peripheral neuropathy 11/21/2008  . AD (atopic dermatitis) 07/08/2008  . Acid reflux 04/05/2007    Orientation RESPIRATION BLADDER Height & Weight     Self, Time, Situation, Place  Normal Continent Weight: (!) 368 lb 12.8 oz (167.3 kg) Height:  6\' 9"  (205.7 cm)  BEHAVIORAL SYMPTOMS/MOOD NEUROLOGICAL BOWEL NUTRITION STATUS      Continent Diet(Carb Modified)  AMBULATORY STATUS COMMUNICATION OF NEEDS Skin   Limited Assist Verbally Surgical wounds                       Personal Care Assistance Level of Assistance  Bathing, Feeding, Dressing Bathing Assistance: Limited  assistance Feeding assistance: Independent Dressing Assistance: Limited assistance     Functional Limitations Info  Sight, Hearing, Speech Sight Info: Adequate Hearing Info: Adequate Speech Info: Adequate    SPECIAL CARE FACTORS FREQUENCY  PT (By licensed PT), OT (By licensed OT)     PT Frequency: Minimum 5x a week OT Frequency: Minimum 5x a week            Contractures Contractures Info: Not present    Additional Factors Info  Code Status, Allergies, Insulin Sliding Scale Code Status Info: Full Code Allergies Info: PENICILLINS, SUCCINYLCHOLINE CHLORIDE, KEFLEX CEPHALEXIN, SULFA ANTIBIOTICS    Insulin Sliding Scale Info: insulin aspart (novoLOG) injection 0-9 Units 3x a day with meals       Current Medications (10/23/2018):  This is the current hospital active medication list Current Facility-Administered Medications  Medication Dose Route Frequency Provider Last Rate Last Dose  . 0.9 %  sodium chloride infusion   Intravenous PRN Katha HammingKonidena, Snehalatha, MD 10 mL/hr at 10/23/18 0315    . acetaminophen (TYLENOL) tablet 650 mg  650 mg Oral Q6H PRN Mansy, Jan A, MD       Or  . acetaminophen (TYLENOL) suppository 650 mg  650 mg Rectal Q6H PRN Mansy, Jan A, MD      . amLODipine (NORVASC) tablet 5 mg  5 mg Oral Daily Mansy, Jan A, MD   5 mg at 10/23/18 0841  . aspirin EC tablet 81 mg  81 mg Oral Daily Mansy, Jan A, MD   81 mg at 10/23/18 0839  . carvedilol (COREG)  tablet 6.25 mg  6.25 mg Oral BID WC Mansy, Jan A, MD   6.25 mg at 10/23/18 0839  . doxycycline (VIBRA-TABS) tablet 100 mg  100 mg Oral Q12H Mansy, Jan A, MD   100 mg at 10/23/18 0841  . DULoxetine (CYMBALTA) DR capsule 30 mg  30 mg Oral Daily Mansy, Jan A, MD   30 mg at 10/23/18 0841  . enoxaparin (LOVENOX) injection 40 mg  40 mg Subcutaneous BID Mansy, Jan A, MD   40 mg at 10/23/18 0841  . furosemide (LASIX) injection 40 mg  40 mg Intravenous BID Epifanio Lesches, MD   40 mg at 10/23/18 0841  . gabapentin  (NEURONTIN) capsule 900 mg  900 mg Oral TID Mansy, Jan A, MD   900 mg at 10/23/18 0839  . HYDROcodone-acetaminophen (NORCO/VICODIN) 5-325 MG per tablet 1-2 tablet  1-2 tablet Oral Q4H PRN Mansy, Arvella Merles, MD   2 tablet at 10/22/18 2209  . insulin aspart (novoLOG) injection 0-9 Units  0-9 Units Subcutaneous TID WC Mansy, Arvella Merles, MD   2 Units at 10/23/18 1226  . insulin glargine (LANTUS) injection 58 Units  58 Units Subcutaneous QHS Epifanio Lesches, MD      . levofloxacin (LEVAQUIN) IVPB 750 mg  750 mg Intravenous Q24H Mansy, Arvella Merles, MD   Stopped at 10/23/18 0251  . losartan (COZAAR) tablet 100 mg  100 mg Oral Daily Mansy, Jan A, MD   100 mg at 10/23/18 0840  . magnesium hydroxide (MILK OF MAGNESIA) suspension 30 mL  30 mL Oral Daily PRN Mansy, Jan A, MD      . Melatonin TABS 10 mg  10 mg Oral QHS PRN Mansy, Jan A, MD      . multivitamin with minerals tablet 1 tablet  1 tablet Oral Daily Mansy, Jan A, MD   1 tablet at 10/23/18 0840  . ondansetron (ZOFRAN) tablet 4 mg  4 mg Oral Q6H PRN Mansy, Jan A, MD       Or  . ondansetron [facility]) injection 4 mg  4 mg Intravenous Q6H PRN Mansy, Jan A, MD      . rosuvastatin (CRESTOR) tablet 20 mg  20 mg Oral Daily Mansy, Jan A, MD   20 mg at 10/22/18 1725  . sodium chloride flush (NS) 0.9 % injection 3 mL  3 mL Intravenous Once Nena Polio, MD      . traZODone (DESYREL) tablet 25 mg  25 mg Oral QHS PRN Mansy, Arvella Merles, MD         Discharge Medications: Please see discharge summary for a list of discharge medications.  Relevant Imaging Results:  Relevant Lab Results:   Additional Information SSN [id number]  Ross Ludwig, LCSW

## 2018-10-24 DIAGNOSIS — R0902 Hypoxemia: Secondary | ICD-10-CM | POA: Diagnosis not present

## 2018-10-24 DIAGNOSIS — F329 Major depressive disorder, single episode, unspecified: Secondary | ICD-10-CM | POA: Diagnosis not present

## 2018-10-24 DIAGNOSIS — G4733 Obstructive sleep apnea (adult) (pediatric): Secondary | ICD-10-CM | POA: Diagnosis not present

## 2018-10-24 DIAGNOSIS — F411 Generalized anxiety disorder: Secondary | ICD-10-CM | POA: Diagnosis not present

## 2018-10-24 LAB — GLUCOSE, CAPILLARY
Glucose-Capillary: 150 mg/dL — ABNORMAL HIGH (ref 70–99)
Glucose-Capillary: 259 mg/dL — ABNORMAL HIGH (ref 70–99)
Glucose-Capillary: 194 mg/dL — ABNORMAL HIGH (ref 70–99)
Glucose-Capillary: 259 mg/dL — ABNORMAL HIGH (ref 70–99)

## 2018-10-24 LAB — AEROBIC CULTURE W GRAM STAIN (SUPERFICIAL SPECIMEN)
Gram Stain: NONE SEEN
Special Requests: NORMAL

## 2018-10-24 MED ORDER — SODIUM CHLORIDE 0.9% FLUSH
3.0000 mL | Freq: Two times a day (BID) | INTRAVENOUS | Status: DC
  Administered 2018-10-24 – 2018-10-26 (×4): 3 mL via INTRAVENOUS

## 2018-10-24 MED ORDER — INSULIN GLARGINE 100 UNIT/ML ~~LOC~~ SOLN
60.0000 [IU] | Freq: Every day | SUBCUTANEOUS | Status: DC
  Administered 2018-10-24 – 2018-10-25 (×2): 60 [IU] via SUBCUTANEOUS
  Filled 2018-10-24 (×3): qty 0.6

## 2018-10-24 NOTE — H&P (Signed)
[facility] [place] NAME: Nathan Dean    MR#:  [id number]  DATE OF BIRTH:  January 12, 1956  SUBJECTIVE: Shortness of breath improved still has significant right knee pain,  CHIEF COMPLAINT:   Chief Complaint  Patient presents with  . Leg Injury  . Shortness of Breath    REVIEW OF SYSTEMS:   Review of Systems  Constitutional: Negative for chills and fever.  HENT: Negative for hearing loss.   Eyes: Negative for blurred vision, double vision and photophobia.  Respiratory: Negative for cough, hemoptysis and shortness of breath.   Cardiovascular: Negative for palpitations, orthopnea and leg swelling.  Gastrointestinal: Negative for abdominal pain, diarrhea and vomiting.  Genitourinary: Negative for dysuria and urgency.  Musculoskeletal: Positive for joint pain. Negative for back pain, myalgias and neck pain.  Skin: Negative for rash.  Neurological: Negative for dizziness, focal weakness, seizures, weakness and headaches.  Psychiatric/Behavioral: Negative for memory loss. The patient does not have insomnia.       DRUG ALLERGIES:   Allergies  Allergen Reactions  . Penicillins Anaphylaxis, Rash and Other (See Comments)  . Succinylcholine Chloride Anaphylaxis and Rash  . Keflex [Cephalexin] Rash  . Sulfa Antibiotics Rash and Other (See Comments)    VITALS:  Blood pressure 120/71, pulse 73, temperature 98.1 F (36.7 C), temperature source Oral, resp. rate 17, height 6\' 9"  (2.057 m), weight (!) 165.4 kg, SpO2 92 %.  PHYSICAL EXAMINATION:  GENERAL:  63 y.o.-year-old patient lying in the bed with no acute distress.  EYES: Pupils equal, round, reactive to light and accommodation. No scleral icterus. Extraocular muscles intact.  HEENT: Head atraumatic, normocephalic. Oropharynx and nasopharynx clear.  NECK:  Supple, no jugular venous distention. No thyroid enlargement, no tenderness.  LUNGS: Normal breath sounds bilaterally, no  wheezing, rales,rhonchi or crepitation. No use of accessory muscles of respiration.  CARDIOVASCULAR: S1, S2 normal. No murmurs, rubs, or gallops.  ABDOMEN: Soft, nontender, nondistended. Bowel sounds present. No organomegaly or mass.  EXTREMITIES: Right knee swelling, limited range of motion. NEUROLOGIC: Cranial nerves II through XII are intact. Muscle strength 5/5 in all extremities. Sensation intact. Gait not checked.  PSYCHIATRIC: The patient is alert and oriented x 3.  SKIN: No obvious rash, lesion, or ulcer.    LABORATORY PANEL:   CBC Recent Labs  Lab 10/23/18 0530  WBC 7.6  HGB 11.5*  HCT 36.8*  PLT 219   ------------------------------------------------------------------------------------------------------------------  Chemistries  Recent Labs  Lab 10/21/18 0523 10/21/18 2155 10/23/18 0530  NA 143 138 142  K 4.3 3.6 3.0*  CL 106 102 103  CO2 28 27 30   GLUCOSE 92 172* 93  BUN 19 19 19   CREATININE 0.73 0.82 0.70  CALCIUM 8.6* 8.4* 8.3*  MG 2.3  --   --   AST  --  26  --   ALT  --  14  --   ALKPHOS  --  57  --   BILITOT  --  0.6  --    ------------------------------------------------------------------------------------------------------------------  Cardiac Enzymes Recent Labs  Lab 10/21/18 2155  TROPONINI <0.03   ------------------------------------------------------------------------------------------------------------------  RADIOLOGY:  No results found.  EKG:   Orders placed or performed during the hospital encounter of 10/18/18  . EKG 12-Lead  . EKG 12-Lead  . ED EKG 12-Lead  . ED EKG 12-Lead  . EKG    ASSESSMENT AND PLAN:  63 year old male with morbid obesity, recently discharged yesterday after he was diagnosed with prepatellar bursitis, sepsis  discharged home with doxycycline had a fall again, wife mentioned that she went to pick up doxycycline prescription she came back she  noted that he fell, he looked confused. #1 .multiple recurrent  falls, right knee contusion, CT of the right knee did not show any hardware infection, right now he is on doxycycline,  Seen by orthopedic, quadriceps atrophy, patient have follow-up care for the need to decrease soft tissue swelling, physical therapy, apply knee immobilizer and the patient is out of bed to prevent buckling of the knee. Right kneecap bruise, seen by wound care nurse, dressing changes as per them.  2.  Recurrent falls,  seen by physical therapy again, recommending SNF.  Spoke to patient's wife yesterday in detail. 3.  Morbid obesity, sleep apnea, use CPAP at night 4.  Acute respiratory failure likely due to combination of obesity hypoventilation, clinical pneumonia, continue doxycycline, ct angio chest negative for PE, patient hypoxia resolved, spoke with patient's wife about CT angios chest results, CT of the knee results.    5.  Depression, seen by psychiatry, started on Wellbutrin.    6.  Diabetes mellitus type 2: Uncontrolled, increase Lantus to 60 units at bedtime.   #7/ acute on chronic diastolic heart failure, improved with IV Lasix, echo showed EF more than 50%. Will discharge to rehab tomorrow if arrangements are made.  To wife yesterday and she is in agreement with that. More than 50% time spent in counseling, coordination of care.   All the records are reviewed and case discussed with Care Management/Social Workerr. Management plans discussed with the patient, family and they are in agreement.  CODE STATUS: Full code  TOTAL TIME TAKING CARE OF THIS PATIENT: 45 minutes.   POSSIBLE D/C IN 2-3 DAYS, DEPENDING ON CLINICAL CONDITION.   Katha HammingSnehalatha Dala Breault M.D on 10/24/2018 at 1:56 PM  Between 7am to 6pm - Pager - [phone]  After 6pm go to [email or web address] - password [email or web address] [facility]  [facility]  Office  [phone]  CC: Primary care physician; Georgina QuintSagardia, Miguel Jose, MD   Note: This dictation was prepared with Dragon dictation along with smaller  phrase technology. Any transcriptional errors that result from this process are unintentional.

## 2018-10-24 NOTE — Progress Notes (Signed)
Physical Therapy Treatment Patient Details Name: Negan Grudzien MRN: [id number] DOB: 21-Jan-1956 Today's Date: 10/24/2018    History of Present Illness Diego Ulbricht is a 68yoM who comes to [facility] after a fall onto his Right knee. Knee was already swollen and injured from 3 prior falls the week before, 1 of which occured while admitted and required nearly an hour to get patient off floor d/t the patient's large size. Pt has since fallen again in hospital. Pt was noted to have ARF upon arrival requiring O2 to maintain saturations.  2DA while admitted pt AMB 249ft with RW without signs of instability. PMH: OSA, HTN, HLD, GERD, DM.     PT Comments    Patient is willing to try therapy today. He performs BLE exercises supine and sitting with pain to right knee with extension exercises. He has extensor lag in right knee with pain during SAQ, LAQ, SLR. He has drainage showing on right knee kerlix that was put on earlier this AM. He has pain and stiffness with seated right knee flex/ext.  KI is donned and he performs transfers sit to stand x 2 and is able to stand for several minutes, but when he shifts to weight bear to RLE he is unable to tolerate the increased pain in right knee and needs to sit down. He is left seated at the bed side without KI , bed is in low position with call bell in reach and nsg notified that he would like his bed changed due to it being soaking wet. He will continue to benefit from skilled PT to improve strength and mobility.   Follow Up Recommendations  SNF     Equipment Recommendations  None recommended by PT    Recommendations for Other Services       Precautions / Restrictions Precautions Precautions: Fall Required Braces or Orthoses: (KI) Restrictions Weight Bearing Restrictions: No    Mobility  Bed Mobility Overal bed mobility: Modified Independent Bed Mobility: Supine to Sit     Supine to sit: Modified independent (Device/Increase time)     General bed  mobility comments: (max assist to don the KI)  Transfers Overall transfer level: Needs assistance Equipment used: (RW, KI, elevated hospital bed )   Sit to Stand: (min assist to hold down the RW for extra support)         General transfer comment: (Needs hospital bed elevated and KI)  Ambulation/Gait Ambulation/Gait assistance: (Not attempted due to increased pain with WB RLE )               Stairs             Wheelchair Mobility    Modified Rankin (Stroke Patients Only)       Balance Overall balance assessment: History of Falls;Mild deficits observed, not formally tested                                          Cognition Arousal/Alertness: Awake/alert                                            Exercises Total Joint Exercises Hip ABduction/ADduction: 10 reps Straight Leg Raises: 10 reps Long Arc Quad: 10 reps Knee Flexion: 10 reps General Exercises - Lower Extremity Short Arc Quad: 10  reps Heel Slides: 10 reps    General Comments        Pertinent Vitals/Pain Pain Assessment: Faces Pain Score: 5  Pain Location: (right knee) Pain Descriptors / Indicators: Aching Pain Intervention(s): Limited activity within patient's tolerance;Monitored during session    Home Living                      Prior Function            PT Goals (current goals can now be found in the care plan section) Acute Rehab PT Goals Patient Stated Goal: To get stronger PT Goal Formulation: With patient Time For Goal Achievement: 11/05/18 Potential to Achieve Goals: Good    Frequency    7X/week      PT Plan      Co-evaluation              AM-PAC PT "6 Clicks" Mobility   Outcome Measure  Help needed turning from your back to your side while in a flat bed without using bedrails?: None Help needed moving from lying on your back to sitting on the side of a flat bed without using bedrails?: A Little Help needed  moving to and from a bed to a chair (including a wheelchair)?: A Little Help needed standing up from a chair using your arms (e.g., wheelchair or bedside chair)?: A Little Help needed to walk in hospital room?: Total Help needed climbing 3-5 steps with a railing? : Total 6 Click Score: 15    End of Session   Activity Tolerance: Patient limited by pain Patient left: (side of bed sitting and NSG notified due to wet sheets) Nurse Communication: (Patient would be unable to stand from recliner chair) PT Visit Diagnosis: Unsteadiness on feet (R26.81);History of falling (Z91.81);Muscle weakness (generalized) (M62.81);Difficulty in walking, not elsewhere classified (R26.2);Pain;Other abnormalities of gait and mobility (R26.89);Repeated falls (R29.6) Pain - Right/Left: Right Pain - part of body: Knee     Time: [id number] PT Time Calculation (min) (ACUTE ONLY): 30 min  Charges:  $Therapeutic Exercise: 8-22 mins $Therapeutic Activity: 8-22 mins                        Ezekiel InaMansfield, Jada Kuhnert S, PT DPT 10/24/2018, 9:44 AM

## 2018-10-24 NOTE — TOC Progression Note (Signed)
Transition of Care [facility]) - Progression Note    Patient Details  Name: Nathan Dean MRN: [id number] Date of Birth: July 28, 1955  Transition of [facility] CM/SW Contact  Ross Ludwig, [place] Phone Number: 10/24/2018, 3:43 PM  Clinical Narrative:     CSW received phone call from [employer] that patient has been approved for SNF once he is medically ready.  Authorization is good through June 11, from [employer].   Expected Discharge Plan: [place] Barriers to Discharge: Continued Medical Work up, Ship broker  Expected Discharge Plan and Services Expected Discharge Plan: [place]   Discharge Planning Services: CM Consult Post Acute Care Choice: [place] Living arrangements for the past 2 months: Single Family Home Expected Discharge Date: 10/23/18               DME Arranged: N/A DME Agency: NA       HH Arranged: NA HH Agency: NA         Social Determinants of Health (SDOH) Interventions    Readmission Risk Interventions Readmission Risk Prevention Plan 10/21/2018  Post Dischage Appt Complete  Medication Screening Complete  Transportation Screening Complete  Some recent data might be hidden

## 2018-10-24 NOTE — Progress Notes (Signed)
Dressing to right knee changed per order. Moderate amount of serosanguinous drainage is on old dressing. Pt tolerated the procedure. Pt had wife bring polar care from home. This was applied to right knee after dressing changed. I will continue to assess.

## 2018-10-24 NOTE — Plan of Care (Signed)
Right knee pain relieved with prn meds.

## 2018-10-24 NOTE — Consult Note (Signed)
[facility] Face-to-Face Psychiatry Consult   Reason for Consult: Depression Referring Physician: Dr. Luberta Mutter Patient Identification: Nathan Dean MRN:  [id number] Principal Diagnosis: Hypoxia Diagnosis:  Principal Problem:   Hypoxia Active Problems:   Depression  Patient seen, chart reviewed, and collateral obtained from wife Nathan Dean [phone]) Total Time spent with patient: 35 minutes  Subjective: "I feel a little bit better today.  I be discharged tomorrow."  HPI:  Nathan Dean is a 63 y.o. male patient admitted with shortness of breath and leg pain.  Patient was recently discharged after he was diagnosed with prepatellar bursitis, sepsis discharged home with doxycycline had a fall again, wife mentioned that she went to pick up doxycycline prescription she came back she  noted that he fell, he looked confused.  Patient has had multiple falls, now recommending skilled nursing facility for rehabilitation.  Patient has morbid obesity with sleep apnea using CPAP at night. Acute respiratory failure likely due to combination of obesity hypoventilation, clinical pneumonia, continue doxycycline, ct angio chest negative for PE, patient hypoxia resolved, spoke with patient's wife about CT angios chest results, CT of the knee results.  Patient's wife mentioned that patient keeps telling her that he is giving up, feels very depressed.  Patient also has medical history of  Diabetes mellitus type 2 and acute on chronic diastolic heart failure, improved with IV Lasix, echo showed EF more than 50%.  Psychiatry consult is requested for assistance with management of depression.  On initial psychiatric evaluation, patient is awake and alert, calm and cooperative in bed.  He reports significant depression, describing low energy and "feeling blah, with no light at the end of the tunnel.  Patient reports his appetite has been fine.  He reports his sleep is adequate as long as he is wearing his CPAP.  He has  lost interest in doing things that are enjoyable, and has felt frustrated that he has been unable to work after being laid off in January and now having COVID quarantine restrictions.  He finds it difficult to move about, partly due to his mood and partly due to his medical comorbidities.  Patient denies suicidal ideation.  He is denying HI or auditory and visual hallucinations.  He has no history of mania.  He has no history of psychosis.  Patient does not abuse alcohol or drugs.  He is not under the care of a psychiatrist or a counselor, but is open to both.  He is prescribed Cymbalta currently by his primary care provider but believes that that was added for pain management and neuropathy.  Patient does not have access to weapons.  On reevaluation 10/24/2018: Patient is awake and alert in bed.  He is smiling with euthymic affect and reported improved mood.  Patient reports he tolerated addition of Wellbutrin XL without any side effect.  Patient states that he continues to sleep well using his CPAP.  He has not required sleep aids that have been prescribed as needed during this hospitalization.  Patient is future oriented and forward thinking.  He is hopeful for discharge to rehabilitation therapy tomorrow so that he is one step closer to returning home independently.  Patient denies SI, HI, AVH.  Past Psychiatric History: Depressed mood  Risk to Self:  Denies Risk to Others:  Denies Prior Inpatient Therapy:  Denies Prior Outpatient Therapy:  Denies  Patient has provided consent to speak with wife Nathan Dean [phone]).  We will plan to attempt collateral.  Past Medical History:  Past Medical History:  Diagnosis Date  . Diabetes (HCC)   . GERD (gastroesophageal reflux disease)   . Hyperlipidemia   . Hypertension   . Sleep apnea     Past Surgical History:  Procedure Laterality Date  . CARPAL TUNNEL RELEASE Right 12/28/2016   Procedure: RIGHT ULNAR AND MEDIAN NEUROPLASTY AT WRIST;  Surgeon:  Mack Hookhompson, David, MD;  Location: [facility];  Service: Orthopedics;  Laterality: Right;  . 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 Ovarian cancer Mother   . Kidney failure Father   . Cancer Brother    Family Psychiatric  History: no family history  Social History:  Social History   Substance and Sexual Activity  Alcohol Use Yes  . Alcohol/week: 0.0 standard drinks   Comment: socially     Social History   Substance and Sexual Activity  Drug Use No    Social History   Socioeconomic History  . Marital status: Married    Spouse name: Not on file  . Number of children: 0  . Years of education: Not on file  . Highest education level: Associate degree: academic program  Occupational History  . Occupation: Currently Working   Social Needs  . Financial resource strain: Not on file  . Food insecurity:    Worry: Not on file    Inability: Not on file  . Transportation needs:    Medical: Not on file    Non-medical: Not on file  Tobacco Use  . Smoking status: Former Smoker    Packs/day: 2.00    Years: 20.00    Pack years: 40.00    Types: Cigarettes    Last attempt to quit: 06/28/1996    Years since quitting: 22.3  . Smokeless tobacco: Never Used  Substance and Sexual Activity  . Alcohol use: Yes    Alcohol/week: 0.0 standard drinks    Comment: socially  . Drug use: No  . Sexual activity: Not on file  Lifestyle  . Physical activity:    Days per week: Not on file    Minutes per session: Not on file  . Stress: Not on file  Relationships  . Social connections:    Talks on phone: Not on file    Gets together: Not on file    Attends religious service: Not on file    Active member of club or organization: Not on file    Attends meetings of clubs or organizations: Not on file    Relationship status: Not on file  Other Topics Concern  . Not on file  Social History  Narrative   Right handed    Lives at home spouse    Caffeine 5 -6 cups     Additional Social History:    Works as Health and safety inspectorpurchasing agent Associates degree and experience in [employer]  Married 2004-2009 -divorced.  Doing temp contract work for 8 years, Last with [employer] and laid off January 2020.  Wife is Comptrollerlibrarian at [employer]. Married x 7 years. 2 adult step-daughters.  No legal issues.  Quit smoking 20+ years ago Alcohol 1/week 2-3 glasses No illicit drugs    Allergies:   Allergies  Allergen Reactions  . Penicillins Anaphylaxis, Rash and Other (See Comments)  . Succinylcholine Chloride Anaphylaxis and Rash  . Keflex [Cephalexin] Rash  . Sulfa Antibiotics Rash and Other (See Comments)    Labs:  Results for orders  placed or performed during the hospital encounter of 10/21/18 (from the past 48 hour(s))  Glucose, capillary     Status: Abnormal   Collection Time: 10/22/18 10:01 PM  Result Value Ref Range   Glucose-Capillary 286 (H) 70 - 99 mg/dL  Basic metabolic panel     Status: Abnormal   Collection Time: 10/23/18  5:30 AM  Result Value Ref Range   Sodium 142 135 - 145 mmol/L   Potassium 3.0 (L) 3.5 - 5.1 mmol/L   Chloride 103 98 - 111 mmol/L   CO2 30 22 - 32 mmol/L   Glucose, Bld 93 70 - 99 mg/dL   BUN 19 8 - 23 mg/dL   Creatinine, Ser 0.70 0.61 - 1.24 mg/dL   Calcium 8.3 (L) 8.9 - 10.3 mg/dL   GFR calc non Af Amer >60 >60 mL/min   GFR calc Af Amer >60 >60 mL/min   Anion gap 9 5 - 15    Comment: Performed at [facility], [address] [place], [place] [address]  CBC     Status: Abnormal   Collection Time: 10/23/18  5:30 AM  Result Value Ref Range   WBC 7.6 4.0 - 10.5 K/uL   RBC 4.17 (L) 4.22 - 5.81 MIL/uL   Hemoglobin 11.5 (L) 13.0 - 17.0 g/dL   HCT 36.8 (L) 39.0 - 52.0 %   MCV 88.2 80.0 - 100.0 fL   MCH 27.6 26.0 - 34.0 pg   MCHC 31.3 30.0 - 36.0 g/dL   RDW 14.6 11.5 - 15.5 %   Platelets 219 150 - 400 K/uL   nRBC 0.0  0.0 - 0.2 %    Comment: Performed at [facility], [address] [place], [place] [address]  Glucose, capillary     Status: Abnormal   Collection Time: 10/23/18  7:53 AM  Result Value Ref Range   Glucose-Capillary 61 (L) 70 - 99 mg/dL  Glucose, capillary     Status: None   Collection Time: 10/23/18  8:28 AM  Result Value Ref Range   Glucose-Capillary 71 70 - 99 mg/dL  Glucose, capillary     Status: Abnormal   Collection Time: 10/23/18 11:35 AM  Result Value Ref Range   Glucose-Capillary 175 (H) 70 - 99 mg/dL  Glucose, capillary     Status: Abnormal   Collection Time: 10/23/18  5:01 PM  Result Value Ref Range   Glucose-Capillary 174 (H) 70 - 99 mg/dL  Glucose, capillary     Status: Abnormal   Collection Time: 10/23/18  8:14 PM  Result Value Ref Range   Glucose-Capillary 220 (H) 70 - 99 mg/dL   Comment 1 Notify RN    Comment 2 Document in Chart   Glucose, capillary     Status: Abnormal   Collection Time: 10/24/18  7:33 AM  Result Value Ref Range   Glucose-Capillary 150 (H) 70 - 99 mg/dL  Glucose, capillary     Status: Abnormal   Collection Time: 10/24/18 12:05 PM  Result Value Ref Range   Glucose-Capillary 259 (H) 70 - 99 mg/dL  Glucose, capillary     Status: Abnormal   Collection Time: 10/24/18  4:39 PM  Result Value Ref Range   Glucose-Capillary 194 (H) 70 - 99 mg/dL    Current Facility-Administered Medications  Medication Dose Route Frequency Provider Last Rate Last Dose  . 0.9 %  sodium chloride infusion   Intravenous PRN Epifanio Lesches, MD   Stopped at 10/24/18 0341  . acetaminophen (TYLENOL)  tablet 650 mg  650 mg Oral Q6H PRN Mansy, Jan A, MD       Or  . acetaminophen (TYLENOL) suppository 650 mg  650 mg Rectal Q6H PRN Mansy, Jan A, MD      . amLODipine (NORVASC) tablet 5 mg  5 mg Oral Daily Mansy, Jan A, MD   5 mg at 10/24/18 0916  . aspirin EC tablet 81 mg  81 mg Oral Daily Mansy, Jan A, MD   81 mg at 10/24/18 0916  . buPROPion (WELLBUTRIN XL) 24  hr tablet 150 mg  150 mg Oral Daily Mariel CraftMaurer, Yurem Viner M, MD   150 mg at 10/24/18 0916  . carvedilol (COREG) tablet 6.25 mg  6.25 mg Oral BID WC Mansy, Jan A, MD   6.25 mg at 10/24/18 1802  . docusate sodium (COLACE) capsule 100 mg  100 mg Oral BID Katha HammingKonidena, Snehalatha, MD   100 mg at 10/24/18 0916  . doxycycline (VIBRA-TABS) tablet 100 mg  100 mg Oral Q12H Mansy, Jan A, MD   100 mg at 10/24/18 0916  . DULoxetine (CYMBALTA) DR capsule 30 mg  30 mg Oral Daily Mansy, Jan A, MD   30 mg at 10/24/18 0916  . enoxaparin (LOVENOX) injection 40 mg  40 mg Subcutaneous BID Mansy, Jan A, MD   40 mg at 10/24/18 0915  . furosemide (LASIX) injection 40 mg  40 mg Intravenous BID Katha HammingKonidena, Snehalatha, MD   40 mg at 10/24/18 0915  . gabapentin (NEURONTIN) capsule 900 mg  900 mg Oral TID Mansy, Jan A, MD   900 mg at 10/24/18 1641  . HYDROcodone-acetaminophen (NORCO/VICODIN) 5-325 MG per tablet 1-2 tablet  1-2 tablet Oral Q4H PRN Mansy, Vernetta HoneyJan A, MD   2 tablet at 10/24/18 1801  . insulin aspart (novoLOG) injection 0-9 Units  0-9 Units Subcutaneous TID WC Mansy, Vernetta HoneyJan A, MD   3 Units at 10/24/18 1802  . insulin glargine (LANTUS) injection 60 Units  60 Units Subcutaneous QHS Katha HammingKonidena, Snehalatha, MD      . levofloxacin (LEVAQUIN) IVPB 750 mg  750 mg Intravenous Q24H Mansy, Vernetta HoneyJan A, MD   Stopped at 10/24/18 [id number]  . losartan (COZAAR) tablet 100 mg  100 mg Oral Daily Mansy, Jan A, MD   100 mg at 10/24/18 0915  . magnesium hydroxide (MILK OF MAGNESIA) suspension 30 mL  30 mL Oral Daily PRN Mansy, Jan A, MD      . Melatonin TABS 10 mg  10 mg Oral QHS PRN Mansy, Jan A, MD      . multivitamin with minerals tablet 1 tablet  1 tablet Oral Daily Mansy, Jan A, MD   1 tablet at 10/24/18 0916  . ondansetron (ZOFRAN) tablet 4 mg  4 mg Oral Q6H PRN Mansy, Jan A, MD       Or  . ondansetron [facility](ZOFRAN) injection 4 mg  4 mg Intravenous Q6H PRN Mansy, Jan A, MD      . rosuvastatin (CRESTOR) tablet 20 mg  20 mg Oral Daily Mansy, Jan A, MD   20 mg at  10/24/18 1801  . traZODone (DESYREL) tablet 25 mg  25 mg Oral QHS PRN Mansy, Vernetta HoneyJan A, MD        Musculoskeletal: Strength & Muscle Tone: decreased Gait & Station: Not assessed, patient in bed Patient leans: N/A  Psychiatric Specialty Exam: Physical Exam  Nursing note and vitals reviewed. Constitutional: He is oriented to person, place, and time. He appears well-developed. No distress.  Obese  HENT:  Head: Normocephalic and atraumatic.  Eyes: EOM are normal.  Neck: Normal range of motion.  Cardiovascular: Normal rate and regular rhythm.  Respiratory: Effort normal. No respiratory distress.  Musculoskeletal: Normal range of motion.  Neurological: He is alert and oriented to person, place, and time.    Review of Systems  Constitutional: Negative.   Respiratory: Negative for shortness of breath.   Cardiovascular: Positive for leg swelling. Negative for chest pain and palpitations.  Musculoskeletal: Positive for joint pain and myalgias.  Neurological: Positive for weakness.  Psychiatric/Behavioral: Positive for depression. Negative for hallucinations, memory loss, substance abuse and suicidal ideas. The patient is nervous/anxious. The patient does not have insomnia.     Blood pressure (!) 124/59, pulse 76, temperature 98.3 F (36.8 C), temperature source Oral, resp. rate 18, height 6\' 9"  (2.057 m), weight (!) 165.4 kg, SpO2 98 %.Body mass index is 39.07 kg/m.  General Appearance: Casual  Eye Contact:  Good  Speech:  Clear and Coherent and Normal Rate  Volume:  Normal  Mood:  Anxious  Affect:  Congruent  Thought Process:  Coherent, Goal Directed and Descriptions of Associations: Intact  Orientation:  Full (Time, Place, and Person)  Thought Content:  Logical and Hallucinations: None  Suicidal Thoughts:  No  Homicidal Thoughts:  No  Memory:  Good  Judgement:  Good  Insight:  Good  Psychomotor Activity:  Normal  Concentration:  Concentration: Good  Recall:  Good  Fund of  Knowledge:  Good  Language:  Good  Akathisia:  No  Handed:  Right  AIMS (if indicated):     Assets:  Communication Skills Desire for Improvement Financial Resources/Insurance Housing Intimacy Resilience Social Support  ADL's:  Impaired, requires assistance  Cognition:  WNL  Sleep:   Adequate with CPAP    Treatment Plan Summary: Daily contact with patient to assess and evaluate symptoms and progress in treatment and Medication management  Continue Cymbalta 30 mg daily for depression and neuropathy Continue Wellbutrin XL 150 mg daily for depression Continue other medications as ordered for medical comorbidities Continue CPAP for OSA  Disposition: No evidence of imminent risk to self or others at present.   Supportive therapy provided about ongoing stressors. Contacted wife for collateral information and for discharge planning to outpatient psychiatrist and therapy.  Patient is interested in starting psychotherapy.  Reviewed with wife how to find providers.   Mariel CraftSHEILA M Dandre Sisler, MD 10/24/2018 7:39 PM

## 2018-10-25 DIAGNOSIS — R0902 Hypoxemia: Secondary | ICD-10-CM | POA: Diagnosis not present

## 2018-10-25 DIAGNOSIS — F411 Generalized anxiety disorder: Secondary | ICD-10-CM | POA: Diagnosis not present

## 2018-10-25 DIAGNOSIS — G4733 Obstructive sleep apnea (adult) (pediatric): Secondary | ICD-10-CM | POA: Diagnosis not present

## 2018-10-25 DIAGNOSIS — F329 Major depressive disorder, single episode, unspecified: Secondary | ICD-10-CM | POA: Diagnosis not present

## 2018-10-25 LAB — GLUCOSE, CAPILLARY
Glucose-Capillary: 141 mg/dL — ABNORMAL HIGH (ref 70–99)
Glucose-Capillary: 217 mg/dL — ABNORMAL HIGH (ref 70–99)
Glucose-Capillary: 221 mg/dL — ABNORMAL HIGH (ref 70–99)
Glucose-Capillary: 242 mg/dL — ABNORMAL HIGH (ref 70–99)

## 2018-10-25 MED ORDER — FUROSEMIDE 20 MG PO TABS
20.0000 mg | ORAL_TABLET | Freq: Every day | ORAL | Status: DC
  Administered 2018-10-25 – 2018-10-26 (×2): 20 mg via ORAL
  Filled 2018-10-25 (×2): qty 1

## 2018-10-25 MED ORDER — INSULIN ASPART 100 UNIT/ML ~~LOC~~ SOLN
3.0000 [IU] | Freq: Three times a day (TID) | SUBCUTANEOUS | Status: DC
  Administered 2018-10-25 – 2018-10-26 (×3): 3 [IU] via SUBCUTANEOUS
  Filled 2018-10-25 (×3): qty 1

## 2018-10-25 MED ORDER — LEVOFLOXACIN 750 MG PO TABS
750.0000 mg | ORAL_TABLET | Freq: Every day | ORAL | Status: DC
  Administered 2018-10-25: 750 mg via ORAL
  Filled 2018-10-25: qty 1

## 2018-10-25 NOTE — Progress Notes (Signed)
Subjective : Patient is day for admission for fall sustaining hematoma with abrasion anterior bursa region of right knee.  Patient states he is doing well this morning.  No complaints other than some soreness Patient reports pain as mild.    Objective: Vital signs in last 24 hours: Temp:  [97.8 F (36.6 C)-98.4 F (36.9 C)] 97.8 F (36.6 C) (06/10 0735) Pulse Rate:  [73-77] 74 (06/10 0735) Resp:  [18-20] 19 (06/10 0735) BP: (118-130)/(59-72) 130/72 (06/10 0735) SpO2:  [92 %-98 %] 92 % (06/10 0735) Wound check Patient is noted to have quite a bit of bruising to the anterior patellar region.  Abrasion area is without drainage today.  I did not appreciate any signs of infection.  No appreciate swelling of the knee or effusion was noted.  Most of this appears to be superficial and not intra-articular.  Intake/Output from previous day: 06/09 0701 - 06/10 0700 In: -  Out: 2775 [Urine:2775] Intake/Output this shift: No intake/output data recorded.  Recent Labs    10/23/18 0530  HGB 11.5*   Recent Labs    10/23/18 0530  WBC 7.6  RBC 4.17*  HCT 36.8*  PLT 219   Recent Labs    10/23/18 0530  NA 142  K 3.0*  CL 103  CO2 30  BUN 19  CREATININE 0.70  GLUCOSE 93  CALCIUM 8.3*   No results for input(s): LABPT, INR in the last 72 hours.  Neurologically intact Neurovascular intact Sensation intact distally Intact pulses distally  Assessment/Plan: Contusion right knee with abrasion and hematoma Case management to assist with discharge planning Physical therapy today Bowel movement today Plan to discharge when medically stable.  Will need to follow-up in [place] [facility] in 6 weeks unless complications Patient will need to wear knee immobilizer when ambulating at all times  Watt Climes 10/25/2018, 8:08 AM

## 2018-10-25 NOTE — TOC Progression Note (Signed)
Transition of Care [facility]) - Progression Note    Patient Details  Name: Nathan Dean MRN: [id number] Date of Birth: April 19, 1956  Transition of Care [facility]) CM/SW Contact  Ross Ludwig, [place] Phone Number: 10/25/2018, 12:21 PM  Clinical Narrative:     CSW updated patient that he has been approved by insurance company to go to [facility].  CSW continuing to follow patient's progress throughout discharge planning.   Expected Discharge Plan: [place] Barriers to Discharge: Continued Medical Work up, Ship broker  Expected Discharge Plan and Services Expected Discharge Plan: [place]   Discharge Planning Services: CM Consult Post Acute Care Choice: [place] Living arrangements for the past 2 months: Single Family Home Expected Discharge Date: 10/23/18               DME Arranged: N/A DME Agency: NA       HH Arranged: NA HH Agency: NA         Social Determinants of Health (SDOH) Interventions    Readmission Risk Interventions Readmission Risk Prevention Plan 10/21/2018  Post Dischage Appt Complete  Medication Screening Complete  Transportation Screening Complete  Some recent data might be hidden

## 2018-10-25 NOTE — Progress Notes (Signed)
Inpatient Diabetes Program Recommendations  AACE/ADA: New Consensus Statement on Inpatient Glycemic Control   Target Ranges:  Prepandial:   less than 140 mg/dL      Peak postprandial:   less than 180 mg/dL (1-2 hours)      Critically ill patients:  140 - 180 mg/dL   Results for VANDERBILT, RANIERI (MRN [id number]) as of 10/25/2018 09:52  Ref. Range 10/24/2018 07:33 10/24/2018 12:05 10/24/2018 16:39 10/24/2018 19:56 10/25/2018 07:36  Glucose-Capillary Latest Ref Range: 70 - 99 mg/dL 150 (H) 259 (H) 194 (H) 259 (H) 141 (H)   Review of Glycemic Control  Current orders for Inpatient glycemic control: Lantsu 60 units QHS, Novolog 0-9 units TID with meals  Inpatient Diabetes Program Recommendations:  Insulin - Meal Coverage:Post prandial glucose is consistently elevated.  Please consider ordering Novolog 3 units TID with meals for meal coverage if patient eats at least 50% of meals.  Thanks, Barnie Alderman, RN, MSN, CDE Diabetes Coordinator Inpatient Diabetes Program [phone] (Team Pager from 8am to 5pm)

## 2018-10-25 NOTE — H&P (Signed)
[facility]   PATIENT NAME: Nathan MottGregory Dean    MR#:  [id number]  DATE OF BIRTH:  02/20/1956  SUBJECTIVE: Shortness of breath improved, still has right knee pain but better with polar ice pack.  Possible discharge to rehab tomorrow.,  CHIEF COMPLAINT:   Chief Complaint  Patient presents with  . Leg Injury  . Shortness of Breath    REVIEW OF SYSTEMS:   Review of Systems  Constitutional: Negative for chills and fever.  HENT: Negative for hearing loss.   Eyes: Negative for blurred vision, double vision and photophobia.  Respiratory: Negative for cough, hemoptysis and shortness of breath.   Cardiovascular: Negative for palpitations, orthopnea and leg swelling.  Gastrointestinal: Negative for abdominal pain, diarrhea and vomiting.  Genitourinary: Negative for dysuria and urgency.  Musculoskeletal: Positive for joint pain. Negative for back pain, myalgias and neck pain.  Skin: Negative for rash.  Neurological: Negative for dizziness, focal weakness, seizures, weakness and headaches.  Psychiatric/Behavioral: Negative for memory loss. The patient does not have insomnia.       DRUG ALLERGIES:   Allergies  Allergen Reactions  . Penicillins Anaphylaxis, Rash and Other (See Comments)  . Succinylcholine Chloride Anaphylaxis and Rash  . Keflex [Cephalexin] Rash  . Sulfa Antibiotics Rash and Other (See Comments)    VITALS:  Blood pressure 130/72, pulse 74, temperature 97.8 F (36.6 C), temperature source Oral, resp. rate 19, height 6\' 9"  (2.057 m), weight (!) 165.4 kg, SpO2 92 %.  PHYSICAL EXAMINATION:  GENERAL:  63 y.o.-year-old patient lying in the bed with no acute distress.  EYES: Pupils equal, round, reactive to light and accommodation. No scleral icterus. Extraocular muscles intact.  HEENT: Head atraumatic, normocephalic. Oropharynx and nasopharynx clear.  NECK:  Supple, no jugular venous distention. No thyroid enlargement, no  tenderness.  LUNGS: Normal breath sounds bilaterally, no wheezing, rales,rhonchi or crepitation. No use of accessory muscles of respiration.  CARDIOVASCULAR: S1, S2 normal. No murmurs, rubs, or gallops.  ABDOMEN: Soft, nontender, nondistended. Bowel sounds present. No organomegaly or mass.  EXTREMITIES: Right knee swelling, limited range of motion. NEUROLOGIC: Cranial nerves II through XII are intact. Muscle strength 5/5 in all extremities. Sensation intact. Gait not checked.  PSYCHIATRIC: The patient is alert and oriented x 3.  SKIN: No obvious rash, lesion, or ulcer.    LABORATORY PANEL:   CBC Recent Labs  Lab 10/23/18 0530  WBC 7.6  HGB 11.5*  HCT 36.8*  PLT 219   ------------------------------------------------------------------------------------------------------------------  Chemistries  Recent Labs  Lab 10/21/18 0523 10/21/18 2155 10/23/18 0530  NA 143 138 142  K 4.3 3.6 3.0*  CL 106 102 103  CO2 28 27 30   GLUCOSE 92 172* 93  BUN 19 19 19   CREATININE 0.73 0.82 0.70  CALCIUM 8.6* 8.4* 8.3*  MG 2.3  --   --   AST  --  26  --   ALT  --  14  --   ALKPHOS  --  57  --   BILITOT  --  0.6  --    ------------------------------------------------------------------------------------------------------------------  Cardiac Enzymes Recent Labs  Lab 10/21/18 2155  TROPONINI <0.03   ------------------------------------------------------------------------------------------------------------------  RADIOLOGY:  No results found.  EKG:   Orders placed or performed during the hospital encounter of 10/18/18  . EKG 12-Lead  . EKG 12-Lead  . ED EKG 12-Lead  . ED EKG 12-Lead  . EKG    ASSESSMENT AND PLAN:  63 year old male with morbid obesity,  recently discharged yesterday after he was diagnosed with prepatellar bursitis, sepsis discharged home with doxycycline had a fall again, wife mentioned that she went to pick up doxycycline prescription she came back she  noted  that he fell, he looked confused. #1 .multiple recurrent falls, right knee contusion, CT of the right knee did not show any hardware infection, right now he is on doxycycline,  Seen by orthopedic, quadriceps atrophy, patient have follow-up care for the need to decrease soft tissue swelling, physical therapy, apply knee immobilizer and the patient is out of bed to prevent buckling of the knee. Right kneecap bruise, seen by wound care nurse, dressing changes as per them.  Possible discharge to [facility] tomorrow, follow-up with orthopedic in 6 weeks.  2.  Recurrent falls,  seen by physical therapy again, recommending SNF.   3.  Morbid obesity, sleep apnea, use CPAP at night 4.  Acute respiratory failure likely due to combination of obesity hypoventilation, clinical pneumonia, continue doxycycline, ct angio chest negative for PE, patient hypoxia resolved, spoke with patient's wife about CT angios chest results, CT of the knee results.    5.  Depression, seen by psychiatry, started on Wellbutrin.,  Continue Wellbutrin, Cymbalta.  6.  Diabetes mellitus type 2: Uncontrolled new Lantus 60 units nightly, also will add NovoLog 3 units 3 times daily with meals as per diabetic coordinator recommendation.  #7/ acute on chronic diastolic heart failure, improved with IV Lasix, echo showed EF more than 50%.  Will stop IV Lasix. Spoke with patient, possible [facility].    More than 50% time spent in counseling, coordination of care.   All the records are reviewed and case discussed with Care Management/Social Workerr. Management plans discussed with the patient, family and they are in agreement.  CODE STATUS: Full code  TOTAL TIME TAKING CARE OF THIS PATIENT: 45 minutes.   POSSIBLE D/C IN 2-3 DAYS, DEPENDING ON CLINICAL CONDITION.   Nathan Dean M.D on 10/25/2018 at 1:43 PM  Between 7am to 6pm - Pager - [phone]  After 6pm go to [email or web address] -  password [email or web address] [facility]  Office  [phone]  CC: Primary care physician; Horald Pollen, MD   Note: This dictation was prepared with Dragon dictation along with smaller phrase technology. Any transcriptional errors that result from this process are unintentional.

## 2018-10-25 NOTE — Progress Notes (Signed)
Physical Therapy Treatment Patient Details Name: Nathan Dean MRN: [id number] DOB: [birth year] Today's Date: 10/25/2018    History of Present Illness Nathan Dean is a 37yoM who comes to [facility] after a fall onto his Right knee. Knee was already swollen and injured from 3 prior falls the week before, 1 of which occured while admitted and required nearly an hour to get patient off floor d/t the patient's large size. Pt has since fallen again in hospital. Pt was noted to have ARF upon arrival requiring O2 to maintain saturations.  2DA while admitted pt AMB 223ft with RW without signs of instability. PMH: OSA, HTN, HLD, GERD, DM.     PT Comments    Patient received on low commode and required +2 max assistance to complete sit to stand transfer. He initially did not have KI on and declined use until he could sit on the bed. He did not demonstrate any buckling or loss of balance with KI doffed or donned. He did become acutely "light headed" and was returned to bed, though no significant abnormality identified in vitals. He is certainly ambulating much more independently today, though requires substantial assistance to complete transfers and would likely continue to benefit from a bout of short term rehabilitation to improve his gait stability and mobility tolerance.    Follow Up Recommendations  SNF     Equipment Recommendations  Rolling walker with 5" wheels    Recommendations for Other Services       Precautions / Restrictions Precautions Precautions: Knee;Fall Precaution Comments: Right knee wound, edema Required Braces or Orthoses: Knee Immobilizer - Right Knee Immobilizer - Right: On when out of bed or walking Restrictions Weight Bearing Restrictions: No    Mobility  Bed Mobility Overal bed mobility: Needs Assistance Bed Mobility: Sit to Supine       Sit to supine: Min guard   General bed mobility comments: Patient is able to complete transfer without assistance, but did  require balance assistance.   Transfers Overall transfer level: Needs assistance Equipment used: Rolling walker (2 wheeled) Transfers: Sit to/from Stand Sit to Stand: Max assist         General transfer comment: Initially patient received on commode, required +2 max assist. From elevated bed position required +2 for min-mod assist with RW  Ambulation/Gait Ambulation/Gait assistance: Min guard Gait Distance (Feet): 25 Feet Assistive device: Rolling walker (2 wheeled) Gait Pattern/deviations: WFL(Within Functional Limits)   Gait velocity interpretation: <1.31 ft/sec, indicative of household ambulator General Gait Details: Patient did not have KI donned on toilet, declined for initial ambulation to bed. Once to bed KI donned. No buckling or deficits observed with or without KI. He became acutely light headed and asked to sit down.    Stairs             Wheelchair Mobility    Modified Rankin (Stroke Patients Only)       Balance Overall balance assessment: History of Falls;Mild deficits observed, not formally tested                                          Cognition Arousal/Alertness: Awake/alert Behavior During Therapy: WFL for tasks assessed/performed Overall Cognitive Status: Within Functional Limits for tasks assessed  Exercises      General Comments General comments (skin integrity, edema, etc.): Edema and ecchymosis noted around R knee. Dried blood on L elbow.       Pertinent Vitals/Pain Pain Assessment: Faces Faces Pain Scale: Hurts little more Pain Location: R knee medially Pain Descriptors / Indicators: Aching Pain Intervention(s): Limited activity within patient's tolerance;Monitored during session(Asked RN staff to fill up his ice pack after session)    Home Living                      Prior Function            PT Goals (current goals can now be found in the  care plan section) Acute Rehab PT Goals Patient Stated Goal: To get stronger PT Goal Formulation: With patient Time For Goal Achievement: 11/05/18 Potential to Achieve Goals: Good Progress towards PT goals: Progressing toward goals    Frequency    7X/week      PT Plan Current plan remains appropriate    Co-evaluation              AM-PAC PT "6 Clicks" Mobility   Outcome Measure  Help needed turning from your back to your side while in a flat bed without using bedrails?: None Help needed moving from lying on your back to sitting on the side of a flat bed without using bedrails?: A Little Help needed moving to and from a bed to a chair (including a wheelchair)?: A Lot Help needed standing up from a chair using your arms (e.g., wheelchair or bedside chair)?: A Lot Help needed to walk in hospital room?: A Little Help needed climbing 3-5 steps with a railing? : Total 6 Click Score: 15    End of Session Equipment Utilized During Treatment: Gait belt;Right knee immobilizer Activity Tolerance: Patient tolerated treatment well;Patient limited by fatigue Patient left: in bed;with nursing/sitter in room Nurse Communication: Mobility status(Need for ice in ice machine.) PT Visit Diagnosis: Unsteadiness on feet (R26.81);History of falling (Z91.81);Muscle weakness (generalized) (M62.81);Difficulty in walking, not elsewhere classified (R26.2);Pain;Other abnormalities of gait and mobility (R26.89);Repeated falls (R29.6) Pain - Right/Left: Right Pain - part of body: Knee     Time: [id number] PT Time Calculation (min) (ACUTE ONLY): 24 min  Charges:  $Gait Training: 23-37 mins           Alva GarnetPatrick Sajjad Dean PT, DPT, CSCS             10/25/2018, 3:52 PM

## 2018-10-25 NOTE — Consult Note (Signed)
[facility]  Reason for Consult: Depression Referring Physician: Dr. Luberta MutterKonidena Patient Identification: Nathan MottGregory Dean MRN:  [id number] Principal Diagnosis: Hypoxia Diagnosis:  Principal Problem:   Hypoxia Active Problems:   Depression  Patient seen, chart reviewed, and collateral obtained from wife Nathan Dean(Nathan Dean [phone]) Total Time spent with patient: 25 minutes  Subjective: "I am okay."  HPI:  Nathan MottGregory Dean is a 63 y.o. male patient admitted with shortness of breath and leg pain.  Patient was recently discharged after he was diagnosed with prepatellar bursitis, sepsis discharged home with doxycycline had a fall again, wife mentioned that she went to pick up doxycycline prescription she came back she  noted that he fell, he looked confused.  Patient has had multiple falls, now recommending skilled nursing facility for rehabilitation.  Patient has morbid obesity with sleep apnea using CPAP at night. Acute respiratory failure likely due to combination of obesity hypoventilation, clinical pneumonia, continue doxycycline, ct angio chest negative for PE, patient hypoxia resolved, spoke with patient's wife about CT angios chest results, CT of the knee results.  Patient's wife mentioned that patient keeps telling her that he is giving up, feels very depressed.  Patient also has medical history of  Diabetes mellitus type 2 and acute on chronic diastolic heart failure, improved with IV Lasix, echo showed EF more than 50%.  Psychiatry consult is requested for assistance with management of depression.  On initial psychiatric evaluation, patient is awake and alert, calm and cooperative in bed.  He reports significant depression, describing low energy and "feeling blah, with no light at the end of the tunnel.  Patient reports his appetite has been fine.  He reports his sleep is adequate as long as he is wearing his CPAP.  He has lost interest in doing things that are  enjoyable, and has felt frustrated that he has been unable to work after being laid off in January and now having COVID quarantine restrictions.  He finds it difficult to move about, partly due to his mood and partly due to his medical comorbidities.  Patient denies suicidal ideation.  He is denying HI or auditory and visual hallucinations.  He has no history of mania.  He has no history of psychosis.  Patient does not abuse alcohol or drugs.  He is not under the care of a psychiatrist or a counselor, but is open to both.  He is prescribed Cymbalta currently by his primary care provider but believes that that was added for pain management and neuropathy.  Patient does not have access to weapons.  On reevaluation 10/25/2018: Patient is awake and alert in bed.  He is smiling with euthymic affect and reported improved mood.  Patient reports he tolerated addition of Wellbutrin XL without any side effect.  Patient states that he continues to sleep well using his CPAP.  Reviewed with patient conversation this Clinical research associatewriter had with wife in regards to seeking outpatient therapy.  Patient is open to starting therapy at discharge along with medication management.  Discussed current life stressors, which patient endorses, "it would be helpful to speak with someone."  Patient denies SI, HI, AVH.  Past Psychiatric History: Depressed mood  Risk to Self:  Denies Risk to Others:  Denies Prior Inpatient Therapy:  Denies Prior Outpatient Therapy:  Denies  Patient has provided consent to speak with wife Nathan MccreedyBarbara [phone]).  Spoke with wife on 10/24/2018 to review outpatient follow-up with mental health services.  Wife is pleased to know that patient is agreeable to  seeking treatment.  Past Medical History:  Past Medical History:  Diagnosis Date  . Diabetes (HCC)   . GERD (gastroesophageal reflux disease)   . Hyperlipidemia   . Hypertension   . Sleep apnea     Past Surgical History:  Procedure Laterality Date  .  CARPAL TUNNEL RELEASE Right 12/28/2016   Procedure: RIGHT ULNAR AND MEDIAN NEUROPLASTY AT WRIST;  Surgeon: Mack Hook, MD;  Location: [facility];  Service: Orthopedics;  Laterality: Right;  . 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 Ovarian cancer Mother   . Kidney failure Father   . Cancer Brother    Family Psychiatric  History: no family history  Social History:  Social History   Substance and Sexual Activity  Alcohol Use Yes  . Alcohol/week: 0.0 standard drinks   Comment: socially     Social History   Substance and Sexual Activity  Drug Use No    Social History   Socioeconomic History  . Marital status: Married    Spouse name: Not on file  . Number of children: 0  . Years of education: Not on file  . Highest education level: Associate degree: academic program  Occupational History  . Occupation: Currently Working   Social Needs  . Financial resource strain: Not on file  . Food insecurity:    Worry: Not on file    Inability: Not on file  . Transportation needs:    Medical: Not on file    Non-medical: Not on file  Tobacco Use  . Smoking status: Former Smoker    Packs/day: 2.00    Years: 20.00    Pack years: 40.00    Types: Cigarettes    Last attempt to quit: 06/28/1996    Years since quitting: 22.3  . Smokeless tobacco: Never Used  Substance and Sexual Activity  . Alcohol use: Yes    Alcohol/week: 0.0 standard drinks    Comment: socially  . Drug use: No  . Sexual activity: Not on file  Lifestyle  . Physical activity:    Days per week: Not on file    Minutes per session: Not on file  . Stress: Not on file  Relationships  . Social connections:    Talks on phone: Not on file    Gets together: Not on file    Attends religious service: Not on file    Active member of club or organization: Not on file    Attends meetings of clubs or organizations:  Not on file    Relationship status: Not on file  Other Topics Concern  . Not on file  Social History Narrative   Right handed    Lives at home spouse    Caffeine 5 -6 cups     Additional Social History:    Works as Health and safety inspector and experience in [employer]  Married 2004-2009 -divorced.  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  Wife is Comptroller at [employer]. Married x 7 years. 2 adult step-daughters.  No legal issues.  Quit smoking 20+ years ago Alcohol 1/week 2-3 glasses No illicit drugs    Allergies:   Allergies  Allergen Reactions  . Penicillins Anaphylaxis, Rash and Other (See Comments)  . Succinylcholine Chloride Anaphylaxis and Rash  . Keflex [Cephalexin] Rash  . Sulfa Antibiotics Rash and  Other (See Comments)    Labs:  Results for orders placed or performed during the hospital encounter of 10/21/18 (from the past 48 hour(s))  Glucose, capillary     Status: Abnormal   Collection Time: 10/23/18  5:01 PM  Result Value Ref Range   Glucose-Capillary 174 (H) 70 - 99 mg/dL  Glucose, capillary     Status: Abnormal   Collection Time: 10/23/18  8:14 PM  Result Value Ref Range   Glucose-Capillary 220 (H) 70 - 99 mg/dL   Comment 1 Notify RN    Comment 2 Document in Chart   Glucose, capillary     Status: Abnormal   Collection Time: 10/24/18  7:33 AM  Result Value Ref Range   Glucose-Capillary 150 (H) 70 - 99 mg/dL  Glucose, capillary     Status: Abnormal   Collection Time: 10/24/18 12:05 PM  Result Value Ref Range   Glucose-Capillary 259 (H) 70 - 99 mg/dL  Glucose, capillary     Status: Abnormal   Collection Time: 10/24/18  4:39 PM  Result Value Ref Range   Glucose-Capillary 194 (H) 70 - 99 mg/dL  Glucose, capillary     Status: Abnormal   Collection Time: 10/24/18  7:56 PM  Result Value Ref Range   Glucose-Capillary 259 (H) 70 - 99 mg/dL   Comment 1 Notify RN     Comment 2 Document in Chart   Glucose, capillary     Status: Abnormal   Collection Time: 10/25/18  7:36 AM  Result Value Ref Range   Glucose-Capillary 141 (H) 70 - 99 mg/dL   Comment 1 Notify RN    Comment 2 Document in Chart   Glucose, capillary     Status: Abnormal   Collection Time: 10/25/18 11:41 AM  Result Value Ref Range   Glucose-Capillary 217 (H) 70 - 99 mg/dL   Comment 1 Notify RN    Comment 2 Document in Chart   Glucose, capillary     Status: Abnormal   Collection Time: 10/25/18  4:15 PM  Result Value Ref Range   Glucose-Capillary 221 (H) 70 - 99 mg/dL   Comment 1 Notify RN    Comment 2 Document in Chart     Current Facility-Administered Medications  Medication Dose Route Frequency Provider Last Rate Last Dose  . 0.9 %  sodium chloride infusion   Intravenous PRN Katha HammingKonidena, Snehalatha, MD   Stopped at 10/24/18 0341  . acetaminophen (TYLENOL) tablet 650 mg  650 mg Oral Q6H PRN Mansy, Jan A, MD       Or  . acetaminophen (TYLENOL) suppository 650 mg  650 mg Rectal Q6H PRN Mansy, Jan A, MD      . amLODipine (NORVASC) tablet 5 mg  5 mg Oral Daily Mansy, Jan A, MD   5 mg at 10/25/18 0840  . aspirin EC tablet 81 mg  81 mg Oral Daily Mansy, Jan A, MD   81 mg at 10/25/18 0841  . buPROPion (WELLBUTRIN XL) 24 hr tablet 150 mg  150 mg Oral Daily Mariel CraftMaurer, Maleea Camilo M, MD   150 mg at 10/25/18 0842  . carvedilol (COREG) tablet 6.25 mg  6.25 mg Oral BID WC Mansy, Jan A, MD   6.25 mg at 10/25/18 0841  . docusate sodium (COLACE) capsule 100 mg  100 mg Oral BID Katha HammingKonidena, Snehalatha, MD   100 mg at 10/25/18 0842  . doxycycline (VIBRA-TABS) tablet 100 mg  100 mg Oral Q12H Mansy, Jan A, MD   100 mg at  10/25/18 0841  . DULoxetine (CYMBALTA) DR capsule 30 mg  30 mg Oral Daily Mansy, Jan A, MD   30 mg at 10/25/18 0841  . enoxaparin (LOVENOX) injection 40 mg  40 mg Subcutaneous BID Mansy, Jan A, MD   40 mg at 10/25/18 16100839  . furosemide (LASIX) tablet 20 mg  20 mg Oral Daily Katha HammingKonidena, Snehalatha, MD       . gabapentin (NEURONTIN) capsule 900 mg  900 mg Oral TID Mansy, Jan A, MD   900 mg at 10/25/18 0841  . HYDROcodone-acetaminophen (NORCO/VICODIN) 5-325 MG per tablet 1-2 tablet  1-2 tablet Oral Q4H PRN Mansy, Vernetta HoneyJan A, MD   1 tablet at 10/25/18 0840  . insulin aspart (novoLOG) injection 0-9 Units  0-9 Units Subcutaneous TID WC Mansy, Vernetta HoneyJan A, MD   2 Units at 10/25/18 1209  . insulin aspart (novoLOG) injection 3 Units  3 Units Subcutaneous TID WC Katha HammingKonidena, Snehalatha, MD      . insulin glargine (LANTUS) injection 60 Units  60 Units Subcutaneous QHS Katha HammingKonidena, Snehalatha, MD   60 Units at 10/24/18 2046  . [START ON 10/26/2018] levofloxacin (LEVAQUIN) tablet 750 mg  750 mg Oral Daily Katha HammingKonidena, Snehalatha, MD      . losartan (COZAAR) tablet 100 mg  100 mg Oral Daily Mansy, Jan A, MD   100 mg at 10/25/18 0841  . magnesium hydroxide (MILK OF MAGNESIA) suspension 30 mL  30 mL Oral Daily PRN Mansy, Jan A, MD      . Melatonin TABS 10 mg  10 mg Oral QHS PRN Mansy, Jan A, MD      . multivitamin with minerals tablet 1 tablet  1 tablet Oral Daily Mansy, Jan A, MD   1 tablet at 10/25/18 0840  . ondansetron (ZOFRAN) tablet 4 mg  4 mg Oral Q6H PRN Mansy, Jan A, MD       Or  . ondansetron [facility](ZOFRAN) injection 4 mg  4 mg Intravenous Q6H PRN Mansy, Jan A, MD      . rosuvastatin (CRESTOR) tablet 20 mg  20 mg Oral Daily Mansy, Jan A, MD   20 mg at 10/24/18 1801  . sodium chloride flush (NS) 0.9 % injection 3 mL  3 mL Intravenous Q12H Katha HammingKonidena, Snehalatha, MD   3 mL at 10/25/18 0842  . traZODone (DESYREL) tablet 25 mg  25 mg Oral QHS PRN Mansy, Vernetta HoneyJan A, MD        Musculoskeletal: Strength & Muscle Tone: decreased Gait & Station: Not assessed, patient in bed Patient leans: N/A  Psychiatric Specialty Exam: Physical Exam  Nursing note and vitals reviewed. Constitutional: He is oriented to person, place, and time. He appears well-developed. No distress.  Obese  HENT:  Head: Normocephalic and atraumatic.  Eyes: EOM are  normal.  Neck: Normal range of motion.  Cardiovascular: Normal rate and regular rhythm.  Respiratory: Effort normal. No respiratory distress.  Musculoskeletal: Normal range of motion.  Neurological: He is alert and oriented to person, place, and time.    Review of Systems  Constitutional: Negative.   Respiratory: Negative for shortness of breath.   Cardiovascular: Positive for leg swelling. Negative for chest pain and palpitations.  Musculoskeletal: Positive for joint pain and myalgias.  Neurological: Positive for weakness.  Psychiatric/Behavioral: Positive for depression. Negative for hallucinations, memory loss, substance abuse and suicidal ideas. The patient is nervous/anxious. The patient does not have insomnia.     Blood pressure 127/67, pulse 78, temperature 98.1 F (36.7 C), temperature source Oral,  resp. rate 19, height 6\' 9"  (2.057 m), weight (!) 165.4 kg, SpO2 95 %.Body mass index is 39.07 kg/m.  General Appearance: Casual  Eye Contact:  Good  Speech:  Clear and Coherent and Normal Rate  Volume:  Normal  Mood:  Euthymic  Affect:  Congruent  Thought Process:  Coherent, Goal Directed and Descriptions of Associations: Intact  Orientation:  Full (Time, Place, and Person)  Thought Content:  Logical and Hallucinations: None  Suicidal Thoughts:  No  Homicidal Thoughts:  No  Memory:  Good  Judgement:  Good  Insight:  Good  Psychomotor Activity:  Normal  Concentration:  Concentration: Good  Recall:  Good  Fund of Knowledge:  Good  Language:  Good  Akathisia:  No  Handed:  Right  AIMS (if indicated):     Assets:  Communication Skills Desire for Improvement Financial Resources/Insurance Housing Intimacy Resilience Social Support  ADL's:  Impaired, requires assistance  Cognition:  WNL  Sleep:   Adequate with CPAP    Treatment Plan Summary: Daily contact with patient to assess and evaluate symptoms and progress in treatment and Medication management  Continue  Cymbalta 30 mg daily for depression and neuropathy Continue Wellbutrin XL 150 mg daily for depression Continue other medications as ordered for medical comorbidities Continue CPAP for OSA  Disposition: No evidence of imminent risk to self or others at present.   Supportive therapy provided about ongoing stressors.  Patient is interested in starting psychotherapy.  Reviewed with patient how to find providers.  10/24/2018 contacted wife for collateral information and for discharge planning to outpatient psychiatrist and therapy.    Lavella Hammock, MD 10/25/2018 4:28 PM

## 2018-10-26 DIAGNOSIS — F329 Major depressive disorder, single episode, unspecified: Secondary | ICD-10-CM | POA: Diagnosis not present

## 2018-10-26 DIAGNOSIS — R0902 Hypoxemia: Secondary | ICD-10-CM | POA: Diagnosis not present

## 2018-10-26 DIAGNOSIS — F411 Generalized anxiety disorder: Secondary | ICD-10-CM | POA: Diagnosis not present

## 2018-10-26 DIAGNOSIS — G4733 Obstructive sleep apnea (adult) (pediatric): Secondary | ICD-10-CM | POA: Diagnosis not present

## 2018-10-26 LAB — NOVEL CORONAVIRUS, NAA (HOSP ORDER, SEND-OUT TO REF LAB; TAT 18-24 HRS): SARS-CoV-2, NAA: NOT DETECTED

## 2018-10-26 LAB — BASIC METABOLIC PANEL
Anion gap: 8 (ref 5–15)
BUN: 21 mg/dL (ref 8–23)
CO2: 32 mmol/L (ref 22–32)
Calcium: 8.7 mg/dL — ABNORMAL LOW (ref 8.9–10.3)
Chloride: 99 mmol/L (ref 98–111)
Creatinine, Ser: 0.68 mg/dL (ref 0.61–1.24)
GFR calc Af Amer: >60 mL/min (ref >60)
GFR calc non Af Amer: >60 mL/min (ref >60)
Glucose, Bld: 129 mg/dL — ABNORMAL HIGH (ref 70–99)
Potassium: 3.5 mmol/L (ref 3.5–5.1)
Sodium: 139 mmol/L (ref 135–145)

## 2018-10-26 LAB — CULTURE, BLOOD (ROUTINE X 2)
Culture: NO GROWTH
Culture: NO GROWTH

## 2018-10-26 LAB — GLUCOSE, CAPILLARY
Glucose-Capillary: 85 mg/dL (ref 70–99)
Glucose-Capillary: 189 mg/dL — ABNORMAL HIGH (ref 70–99)

## 2018-10-26 MED ORDER — LEVOFLOXACIN 750 MG PO TABS: 750.0000 mg | ORAL_TABLET | Freq: Every day | ORAL | 0 refills | Status: DC

## 2018-10-26 MED ORDER — HYDROCODONE-ACETAMINOPHEN 5-325 MG PO TABS: 1.0000 | ORAL_TABLET | ORAL | 0 refills | Status: DC | PRN

## 2018-10-26 MED ORDER — BUPROPION HCL ER (XL) 150 MG PO TB24: 150.0000 mg | ORAL_TABLET | Freq: Every day | ORAL | 0 refills | Status: DC

## 2018-10-26 MED ORDER — DOXYCYCLINE HYCLATE 100 MG PO TABS: 100.0000 mg | ORAL_TABLET | Freq: Two times a day (BID) | ORAL | 0 refills | Status: DC

## 2018-10-26 NOTE — Plan of Care (Signed)
Pt ready for transfer to rehab facility.   Problem: Acute Rehab PT Goals(only PT should resolve) Goal: Patient Will Transfer Sit To/From Stand Outcome: Completed/Met Goal: Pt Will Ambulate Outcome: Completed/Met Goal: Pt Will Go Up/Down Stairs Outcome: Completed/Met Goal: Pt/caregiver will Perform Home Exercise Program Outcome: Completed/Met   Problem: Education: Goal: Knowledge of General Education information will improve Description: Including pain rating scale, medication(s)/side effects and non-pharmacologic comfort measures Outcome: Completed/Met   Problem: Health Behavior/Discharge Planning: Goal: Ability to manage health-related needs will improve Outcome: Completed/Met   Problem: Clinical Measurements: Goal: Ability to maintain clinical measurements within normal limits will improve Outcome: Completed/Met Goal: Will remain free from infection Outcome: Completed/Met Goal: Diagnostic test results will improve Outcome: Completed/Met Goal: Respiratory complications will improve Outcome: Completed/Met Goal: Cardiovascular complication will be avoided Outcome: Completed/Met   Problem: Activity: Goal: Risk for activity intolerance will decrease Outcome: Completed/Met   Problem: Pain Managment: Goal: General experience of comfort will improve Outcome: Completed/Met   Problem: Safety: Goal: Ability to remain free from injury will improve Outcome: Completed/Met   Problem: Skin Integrity: Goal: Risk for impaired skin integrity will decrease Outcome: Completed/Met   Problem: Education: Goal: Ability to demonstrate management of disease process will improve Outcome: Completed/Met Goal: Ability to verbalize understanding of medication therapies will improve Outcome: Completed/Met Goal: Individualized Educational Video(s) Outcome: Completed/Met   Problem: Cardiac: Goal: Ability to achieve and maintain adequate cardiopulmonary perfusion will improve Outcome:  Completed/Met

## 2018-10-26 NOTE — Consult Note (Signed)
[facility] Face-to-Face Psychiatry Consult follow-up  Reason for Consult: Depression Referring Physician: Dr. Luberta Mutter Patient Identification: Nathan Dean MRN:  [id number] Principal Diagnosis: Hypoxia Diagnosis:  Principal Problem:   Hypoxia Active Problems:   Depression  Patient seen, chart reviewed, and collateral obtained from wife Nathan Dean [phone]) Total Time spent with patient: 15 minutes  Subjective: "I feel good, I am ready to go to rehab.  I am thinking it might be time for me to go on disability."  HPI:  Nathan Dean is a 63 y.o. male patient admitted with shortness of breath and leg pain.  Patient was recently discharged after he was diagnosed with prepatellar bursitis, sepsis discharged home with doxycycline had a fall again, wife mentioned that she went to pick up doxycycline prescription she came back she  noted that he fell, he looked confused.  Patient has had multiple falls, now recommending skilled nursing facility for rehabilitation.  Patient has morbid obesity with sleep apnea using CPAP at night. Acute respiratory failure likely due to combination of obesity hypoventilation, clinical pneumonia, continue doxycycline, ct angio chest negative for PE, patient hypoxia resolved, spoke with patient's wife about CT angios chest results, CT of the knee results.  Patient's wife mentioned that patient keeps telling her that he is giving up, feels very depressed.  Patient also has medical history of  Diabetes mellitus type 2 and acute on chronic diastolic heart failure, improved with IV Lasix, echo showed EF more than 50%.  Psychiatry consult is requested for assistance with management of depression.  On initial psychiatric evaluation, patient is awake and alert, calm and cooperative in bed.  He reports significant depression, describing low energy and "feeling blah, with no light at the end of the tunnel.  Patient reports his appetite has been fine.  He reports his sleep is  adequate as long as he is wearing his CPAP.  He has lost interest in doing things that are enjoyable, and has felt frustrated that he has been unable to work after being laid off in January and now having COVID quarantine restrictions.  He finds it difficult to move about, partly due to his mood and partly due to his medical comorbidities.  Patient denies suicidal ideation.  He is denying HI or auditory and visual hallucinations.  He has no history of mania.  He has no history of psychosis.  Patient does not abuse alcohol or drugs.  He is not under the care of a psychiatrist or a counselor, but is open to both.  He is prescribed Cymbalta currently by his primary care provider but believes that that was added for pain management and neuropathy.  Patient does not have access to weapons.  On reevaluation 10/26/2018: Patient is awake and alert in bed.  He is smiling with euthymic affect and reported improved mood.  Patient is future oriented and looking forward to, "the next saga of my life."  He is optimistic that he will do well in rehab.  He is agreeable to continuing medication, Wellbutrin XL which she has tolerated without any side effect, and starting therapy. Patient denies SI, HI, AVH.  Past Psychiatric History: Depressed mood  Risk to Self:  Denies Risk to Others:  Denies Prior Inpatient Therapy:  Denies Prior Outpatient Therapy:  Denies  Patient has provided consent to speak with wife Nathan Dean [phone]).  Spoke with wife on 10/24/2018 to review outpatient follow-up with mental health services.  Wife is pleased to know that patient is agreeable to seeking treatment.  Past  Medical History:  Past Medical History:  Diagnosis Date  . Diabetes ([place])   . GERD (gastroesophageal reflux disease)   . Hyperlipidemia   . Hypertension   . Sleep apnea     Past Surgical History:  Procedure Laterality Date  . CARPAL TUNNEL RELEASE Right 12/28/2016   Procedure: RIGHT ULNAR AND MEDIAN NEUROPLASTY AT  WRIST;  Surgeon: Milly Jakob, MD;  Location: [facility];  Service: Orthopedics;  Laterality: Right;  . 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 Ovarian cancer Mother   . Kidney failure Father   . Cancer Brother    Family Psychiatric  History: no family history  Social History:  Social History   Substance and Sexual Activity  Alcohol Use Yes  . Alcohol/week: 0.0 standard drinks   Comment: socially     Social History   Substance and Sexual Activity  Drug Use No    Social History   Socioeconomic History  . Marital status: Married    Spouse name: Not on file  . Number of children: 0  . Years of education: Not on file  . Highest education level: Associate degree: academic program  Occupational History  . Occupation: Currently Working   Social Needs  . Financial resource strain: Not on file  . Food insecurity    Worry: Not on file    Inability: Not on file  . Transportation needs    Medical: Not on file    Non-medical: Not on file  Tobacco Use  . Smoking status: Former Smoker    Packs/day: 2.00    Years: 20.00    Pack years: 40.00    Types: Cigarettes    Quit date: 06/28/1996    Years since quitting: 22.3  . Smokeless tobacco: Never Used  Substance and Sexual Activity  . Alcohol use: Yes    Alcohol/week: 0.0 standard drinks    Comment: socially  . Drug use: No  . Sexual activity: Not on file  Lifestyle  . Physical activity    Days per week: Not on file    Minutes per session: Not on file  . Stress: Not on file  Relationships  . Social Herbalist on phone: Not on file    Gets together: Not on file    Attends religious service: Not on file    Active member of club or organization: Not on file    Attends meetings of clubs or organizations: Not on file    Relationship status: Not on file  Other Topics Concern  . Not on file  Social History  Narrative   Right handed    Lives at home spouse    Caffeine 5 -6 cups     Additional Social History:    Works as Artist and experience in [employer]  Married 2004-2009 -divorced.  Doing temp contract work for 8 years, Last with [employer] and laid off January 2020.  Wife is Licensed conveyancer at [employer]. Married x 7 years. 2 adult step-daughters.  No legal issues.  Quit smoking 20+ years ago Alcohol 1/week 2-3 glasses No illicit drugs    Allergies:   Allergies  Allergen Reactions  . Penicillins Anaphylaxis, Rash and Other (See Comments)  . Succinylcholine Chloride Anaphylaxis and Rash  . Keflex [Cephalexin] Rash  . Sulfa Antibiotics Rash and Other (See Comments)  Labs:  Results for orders placed or performed during the hospital encounter of 10/21/18 (from the past 48 hour(s))  Glucose, capillary     Status: Abnormal   Collection Time: 10/24/18  4:39 PM  Result Value Ref Range   Glucose-Capillary 194 (H) 70 - 99 mg/dL  Glucose, capillary     Status: Abnormal   Collection Time: 10/24/18  7:56 PM  Result Value Ref Range   Glucose-Capillary 259 (H) 70 - 99 mg/dL   Comment 1 Notify RN    Comment 2 Document in Chart   Glucose, capillary     Status: Abnormal   Collection Time: 10/25/18  7:36 AM  Result Value Ref Range   Glucose-Capillary 141 (H) 70 - 99 mg/dL   Comment 1 Notify RN    Comment 2 Document in Chart   Glucose, capillary     Status: Abnormal   Collection Time: 10/25/18 11:41 AM  Result Value Ref Range   Glucose-Capillary 217 (H) 70 - 99 mg/dL   Comment 1 Notify RN    Comment 2 Document in Chart   Novel Coronavirus, NAA (hospital order; send-out to ref lab)     Status: None   Collection Time: 10/25/18  3:34 PM   Specimen: Nasopharyngeal Swab; Respiratory  Result Value Ref Range   SARS-CoV-2, NAA NOT DETECTED NOT DETECTED    Comment: (NOTE) This test was developed and its performance  characteristics determined by [employer]. This test has not been FDA cleared or approved. This test has been authorized by FDA under an Emergency Use Authorization (EUA). This test is only authorized for the duration of time the declaration that circumstances exist justifying the authorization of the emergency use of in vitro diagnostic tests for detection of SARS-CoV-2 virus and/or diagnosis of COVID-19 infection under section 564(b)(1) of the Act, 21 U.S.C. 865HQI-6(N)(6), unless the authorization is terminated or revoked sooner. When diagnostic testing is negative, the possibility of a false negative result should be considered in the context of a patient's recent exposures and the presence of clinical signs and symptoms consistent with COVID-19. An individual without symptoms of COVID-19 and who is not shedding SARS-CoV-2 virus would expect to have a negative (not detected) result in this assay. Performed  At: [facility] [address] [place], [place] [id number] Jolene Schimke MD [id number]    Coronavirus Source NASOPHARYNGEAL     Comment: Performed at [facility], [address] [place], [place] [address]  Glucose, capillary     Status: Abnormal   Collection Time: 10/25/18  4:15 PM  Result Value Ref Range   Glucose-Capillary 221 (H) 70 - 99 mg/dL   Comment 1 Notify RN    Comment 2 Document in Chart   Glucose, capillary     Status: Abnormal   Collection Time: 10/25/18  8:55 PM  Result Value Ref Range   Glucose-Capillary 242 (H) 70 - 99 mg/dL  Basic metabolic panel     Status: Abnormal   Collection Time: 10/26/18  4:18 AM  Result Value Ref Range   Sodium 139 135 - 145 mmol/L   Potassium 3.5 3.5 - 5.1 mmol/L   Chloride 99 98 - 111 mmol/L   CO2 32 22 - 32 mmol/L   Glucose, Bld 129 (H) 70 - 99 mg/dL   BUN 21 8 - 23 mg/dL   Creatinine, Ser 7.42 0.61 - 1.24 mg/dL   Calcium 8.7 (L) 8.9 - 10.3 mg/dL   GFR calc non Af Amer >60 >60 mL/min  GFR calc Af Amer >60 >60 mL/min   Anion gap 8 5 - 15    Comment: Performed at [facility], [address] [place], [place] [address]  Glucose, capillary     Status: None   Collection Time: 10/26/18  7:43 AM  Result Value Ref Range   Glucose-Capillary 85 70 - 99 mg/dL  Glucose, capillary     Status: Abnormal   Collection Time: 10/26/18 11:48 AM  Result Value Ref Range   Glucose-Capillary 189 (H) 70 - 99 mg/dL    Current Facility-Administered Medications  Medication Dose Route Frequency Provider Last Rate Last Dose  . 0.9 %  sodium chloride infusion   Intravenous PRN Katha HammingKonidena, Snehalatha, MD   Stopped at 10/24/18 0341  . acetaminophen (TYLENOL) tablet 650 mg  650 mg Oral Q6H PRN Mansy, Jan A, MD       Or  . acetaminophen (TYLENOL) suppository 650 mg  650 mg Rectal Q6H PRN Mansy, Jan A, MD      . amLODipine (NORVASC) tablet 5 mg  5 mg Oral Daily Mansy, Jan A, MD   5 mg at 10/26/18 0927  . aspirin EC tablet 81 mg  81 mg Oral Daily Mansy, Jan A, MD   81 mg at 10/26/18 [id number]  . buPROPion (WELLBUTRIN XL) 24 hr tablet 150 mg  150 mg Oral Daily Mariel CraftMaurer,  M, MD   150 mg at 10/26/18 0927  . carvedilol (COREG) tablet 6.25 mg  6.25 mg Oral BID WC Mansy, Jan A, MD   6.25 mg at 10/26/18 0926  . docusate sodium (COLACE) capsule 100 mg  100 mg Oral BID Katha HammingKonidena, Snehalatha, MD   100 mg at 10/26/18 0926  . doxycycline (VIBRA-TABS) tablet 100 mg  100 mg Oral Q12H Mansy, Jan A, MD   100 mg at 10/26/18 0927  . DULoxetine (CYMBALTA) DR capsule 30 mg  30 mg Oral Daily Mansy, Jan A, MD   30 mg at 10/26/18 0927  . enoxaparin (LOVENOX) injection 40 mg  40 mg Subcutaneous BID Mansy, Jan A, MD   40 mg at 10/26/18 [id number]  . furosemide (LASIX) tablet 20 mg  20 mg Oral Daily Katha HammingKonidena, Snehalatha, MD   20 mg at 10/26/18 0927  . gabapentin (NEURONTIN) capsule 900 mg  900 mg Oral TID Mansy, Jan A, MD   900 mg at 10/26/18 0926  . HYDROcodone-acetaminophen (NORCO/VICODIN) 5-325 MG per tablet 1-2 tablet  1-2  tablet Oral Q4H PRN Mansy, Vernetta HoneyJan A, MD   2 tablet at 10/26/18 1216  . insulin aspart (novoLOG) injection 0-9 Units  0-9 Units Subcutaneous TID WC Mansy, Vernetta HoneyJan A, MD   2 Units at 10/26/18 1216  . insulin aspart (novoLOG) injection 3 Units  3 Units Subcutaneous TID WC Katha HammingKonidena, Snehalatha, MD   3 Units at 10/26/18 1216  . insulin glargine (LANTUS) injection 60 Units  60 Units Subcutaneous QHS Katha HammingKonidena, Snehalatha, MD   60 Units at 10/25/18 2115  . levofloxacin (LEVAQUIN) tablet 750 mg  750 mg Oral Daily Katha HammingKonidena, Snehalatha, MD   750 mg at 10/25/18 2208  . losartan (COZAAR) tablet 100 mg  100 mg Oral Daily Mansy, Jan A, MD   100 mg at 10/26/18 0927  . magnesium hydroxide (MILK OF MAGNESIA) suspension 30 mL  30 mL Oral Daily PRN Mansy, Jan A, MD      . Melatonin TABS 10 mg  10 mg Oral QHS PRN Mansy, Vernetta HoneyJan A, MD      . multivitamin with  minerals tablet 1 tablet  1 tablet Oral Daily Mansy, Jan A, MD   1 tablet at 10/26/18 0927  . ondansetron (ZOFRAN) tablet 4 mg  4 mg Oral Q6H PRN Mansy, Jan A, MD       Or  . ondansetron [facility](ZOFRAN) injection 4 mg  4 mg Intravenous Q6H PRN Mansy, Jan A, MD      . rosuvastatin (CRESTOR) tablet 20 mg  20 mg Oral Daily Mansy, Jan A, MD   20 mg at 10/25/18 1704  . sodium chloride flush (NS) 0.9 % injection 3 mL  3 mL Intravenous Q12H Katha HammingKonidena, Snehalatha, MD   3 mL at 10/26/18 1000  . traZODone (DESYREL) tablet 25 mg  25 mg Oral QHS PRN Mansy, Vernetta HoneyJan A, MD        Musculoskeletal: Strength & Muscle Tone: decreased Gait & Station: Not assessed, patient in bed Patient leans: N/A  Psychiatric Specialty Exam: Physical Exam  Nursing note and vitals reviewed. Constitutional: He is oriented to person, place, and time. He appears well-developed. No distress.  Obese  HENT:  Head: Normocephalic and atraumatic.  Eyes: EOM are normal.  Neck: Normal range of motion.  Cardiovascular: Normal rate and regular rhythm.  Respiratory: Effort normal. No respiratory distress.  Musculoskeletal:  Normal range of motion.  Neurological: He is alert and oriented to person, place, and time.    Review of Systems  Constitutional: Negative.   Respiratory: Negative for shortness of breath.   Cardiovascular: Positive for leg swelling. Negative for chest pain and palpitations.  Musculoskeletal: Positive for joint pain and myalgias.  Neurological: Positive for weakness.  Psychiatric/Behavioral: Positive for depression. Negative for hallucinations, memory loss, substance abuse and suicidal ideas. The patient is nervous/anxious. The patient does not have insomnia.     Blood pressure 123/66, pulse 67, temperature 97.7 F (36.5 C), temperature source Oral, resp. rate 16, height 6\' 9"  (2.057 m), weight (!) 165.4 kg, SpO2 99 %.Body mass index is 39.07 kg/m.  General Appearance: Casual  Eye Contact:  Good  Speech:  Clear and Coherent and Normal Rate  Volume:  Normal  Mood:  Euthymic  Affect:  Congruent  Thought Process:  Coherent, Goal Directed and Descriptions of Associations: Intact  Orientation:  Full (Time, Place, and Person)  Thought Content:  Logical and Hallucinations: None  Suicidal Thoughts:  No  Homicidal Thoughts:  No  Memory:  Good  Judgement:  Good  Insight:  Good  Psychomotor Activity:  Normal  Concentration:  Concentration: Good  Recall:  Good  Fund of Knowledge:  Good  Language:  Good  Akathisia:  No  Handed:  Right  AIMS (if indicated):     Assets:  Communication Skills Desire for Improvement Financial Resources/Insurance Housing Intimacy Resilience Social Support  ADL's:  Impaired, requires assistance  Cognition:  WNL  Sleep:   Adequate with CPAP    Treatment Plan Summary: Daily contact with patient to assess and evaluate symptoms and progress in treatment and Medication management  Continue Cymbalta 30 mg daily for depression and neuropathy Continue Wellbutrin XL 150 mg daily for depression Continue other medications as ordered for medical  comorbidities Continue CPAP for OSA  Disposition: No evidence of imminent risk to self or others at present.   Supportive therapy provided about ongoing stressors.  Patient is interested in starting psychotherapy.  Reviewed with patient how to find providers.  10/24/2018 contacted wife for collateral information and for discharge planning to outpatient psychiatrist and therapy.    Patient may  be discharged to rehabilitation and agrees to psychiatric follow-up as an outpatient after discharge from rehab.  Mariel CraftSHEILA M , MD 10/26/2018 4:06 PM

## 2018-10-26 NOTE — Progress Notes (Addendum)
Physical Therapy Treatment Patient Details Name: Nathan Dean MRN: [id number] DOB: 03/18/1956 Today's Date: 10/26/2018    History of Present Illness Nathan Dean is a 63yoM who [facility] after a fall onto his Right knee. Knee was already swollen and injured from 3 prior falls the week before, 1 of which occured while admitted and required nearly an hour to get patient off floor d/t the patient's large size. Pt has since fallen again in hospital. Pt was noted to have ARF upon arrival requiring O2 to maintain saturations.  2DA while admitted pt AMB 28600ft with RW without signs of instability. PMH: OSA, HTN, HLD, GERD, DM.     PT Comments    PT in bed upon entry. Pain control improving since prior visits. Exercises performed in bed, tolerance improved, but pt still needs physical assist to perform TKE last few degrees during SAQ 2/2 pain. Pt requires modA to rise from sitting with KI donned, but later performed 10x STS without KI doffed and moderate effort. AMB tolerance improved, no dizziness this date, pain well controlled, but pt fatigues requiring standing rest breaks twice, as his reliance on BUE support is exhausting.     Follow Up Recommendations  SNF     Equipment Recommendations  Rolling walker with 5" wheels    Recommendations for Other Services       Precautions / Restrictions Precautions Precautions: Knee;Fall Precaution Comments: Right knee wound, edema Required Braces or Orthoses: Knee Immobilizer - Right Knee Immobilizer - Right: On when out of bed or walking    Mobility  Bed Mobility Overal bed mobility: Modified Independent Bed Mobility: Supine to Sit     Supine to sit: Modified independent (Device/Increase time) Sit to supine: Min guard;HOB elevated;Mod assist   General bed mobility comments: Patient is able to complete transfer without assistance, but did require balance assistance.   Transfers Overall transfer level: Needs assistance Equipment used:  Rolling walker (2 wheeled) Transfers: Sit to/from Stand Sit to Stand: Mod assist         General transfer comment: ModA with KI donned, but MinGuard-supervision from elevated surface  Ambulation/Gait Ambulation/Gait assistance: Min guard Gait Distance (Feet): 160 Feet Assistive device: Rolling walker (2 wheeled) Gait Pattern/deviations: WFL(Within Functional Limits)     General Gait Details: c KI donned, still with heavy BUE support needed, pt fatigues quickly stops to take standing rest breaks 2 times or Q2675ft   Stairs             Wheelchair Mobility    Modified Rankin (Stroke Patients Only)       Balance                                            Cognition Arousal/Alertness: Awake/alert Behavior During Therapy: WFL for tasks assessed/performed Overall Cognitive Status: Within Functional Limits for tasks assessed                                        Exercises General Exercises - Lower Extremity Ankle Circles/Pumps: AROM;15 reps;Both;Supine Short Arc Quad: 20 reps;AAROM;Right;Supine;Limitations Short Arc Quad Limitations: Needs assist for TKE Heel Slides: 15 reps;Supine;AAROM;Right Other Exercises Other Exercises: STS from elevated bed x10    General Comments        Pertinent Vitals/Pain Pain Score: 4  Pain Location: R knee medially Pain Descriptors / Indicators: Aching Pain Intervention(s): Limited activity within patient's tolerance;Monitored during session;Premedicated before session;Repositioned    Home Living                      Prior Function            PT Goals (current goals can now be found in the care plan section) Acute Rehab PT Goals Patient Stated Goal: To get stronger PT Goal Formulation: With patient Time For Goal Achievement: 11/05/18 Potential to Achieve Goals: Good Progress towards PT goals: Progressing toward goals    Frequency    7X/week      PT Plan Current plan  remains appropriate    Co-evaluation              AM-PAC PT "6 Clicks" Mobility   Outcome Measure  Help needed turning from your back to your side while in a flat bed without using bedrails?: None Help needed moving from lying on your back to sitting on the side of a flat bed without using bedrails?: A Little Help needed moving to and from a bed to a chair (including a wheelchair)?: A Lot Help needed standing up from a chair using your arms (e.g., wheelchair or bedside chair)?: A Lot Help needed to walk in hospital room?: A Little Help needed climbing 3-5 steps with a railing? : Total 6 Click Score: 15    End of Session Equipment Utilized During Treatment: Gait belt;Right knee immobilizer Activity Tolerance: Patient tolerated treatment well;Patient limited by fatigue Patient left: in bed;with nursing/sitter in room Nurse Communication: Mobility status;Other (comment)(pt needs new gown) PT Visit Diagnosis: Unsteadiness on feet (R26.81);History of falling (Z91.81);Muscle weakness (generalized) (M62.81);Difficulty in walking, not elsewhere classified (R26.2);Pain;Other abnormalities of gait and mobility (R26.89);Repeated falls (R29.6) Pain - Right/Left: Right Pain - part of body: Knee     Time: 6659-9357 PT Time Calculation (min) (ACUTE ONLY): 30 min  Charges:  $Gait Training: 8-22 mins $Therapeutic Exercise: 8-22 mins                     1:31 PM, 10/26/18 Etta Grandchild, PT, DPT Physical Therapist - [facility]  [phone] ([place])    Wray C 10/26/2018, 1:29 PM

## 2018-10-26 NOTE — Progress Notes (Signed)
Report called to [place], Therapist, sports at [employer].  Writer spoke with pt's wife, Pamala Hurry, and provided her with an update on pt's progress and imminent discharge.

## 2018-10-26 NOTE — TOC Transition Note (Signed)
Transition of Care [facility]) - CM/SW Discharge Note   Patient Details  Name: Nathan Dean MRN: [id number] Date of Birth: 02/23/1956  [facility] of Care [facility]) CM/SW Contact:  Ross Ludwig, LCSW Phone Number: 10/26/2018, 3:34 PM   Clinical Narrative:     Patient will be discharging to [facility] for short term rehab.  Patient has been approved by [facility].  Patient to be d/c'ed today to room 805.  Patient and family agreeable to plans will transport via ems RN to call report.  Patient updated his wife about discharging to SNF.  Patient's wife will bring his Cpap machine that he uses at night to [facility].  Patient did not have any other questions or concerns.   Final next level of care: [place] Barriers to Discharge: No Barriers Identified   Patient Goals and CMS Choice Patient states their goals for this hospitalization and ongoing recovery are:: To receive some rehab, then return back home with home health. CMS Medicare.gov Compare Post Acute Care list provided to:: Patient Choice offered to / list presented to : Patient  Discharge Placement PASRR number recieved: 10/25/18            Patient chooses bed at: [facility] [place] Patient to be transferred to facility by: [facility] Name of family member notified: Antonious, Omahoney [phone] Patient and family notified of of transfer: 10/26/18  Discharge Plan and Services   Discharge Planning Services: CM Consult Post Acute Care Choice: [place]          DME Arranged: N/A DME Agency: NA       HH Arranged: NA HH Agency: NA        Social Determinants of Health (SDOH) Interventions     Readmission Risk Interventions Readmission Risk Prevention Plan 10/21/2018  Post Dischage Appt Complete  Medication Screening Complete  Transportation Screening Complete  Some recent data might be hidden

## 2018-10-26 NOTE — Discharge Summary (Signed)
Nathan Dean, is a 63 y.o. male  DOB 11-27-1955  MRN [id number].  Admission date:  10/21/2018  Admitting Physician  Christel Mormon, MD  Discharge Date:  10/26/2018   Primary MD  Horald Pollen, MD  Recommendations for primary care physician for things to follow:  Follow with PCP in 1 week Follow-up with Dr. Marry Guan in 3 to 4 weeks.   Admission Diagnosis  Weakness [R53.1] Hypoxia [R09.02] Healthcare-associated pneumonia [J18.9] Chronic pain of right knee [M25.561, G89.29]   Discharge Diagnosis  Weakness [R53.1] Hypoxia [R09.02] Healthcare-associated pneumonia [J18.9] Chronic pain of right knee [M25.561, G89.29]   Principal Problem:   Hypoxia Active Problems:   Depression      Past Medical History:  Diagnosis Date   Diabetes ([place])    GERD (gastroesophageal reflux disease)    Hyperlipidemia    Hypertension    Sleep apnea     Past Surgical History:  Procedure Laterality Date   CARPAL TUNNEL RELEASE Right 12/28/2016   Procedure: RIGHT ULNAR AND MEDIAN NEUROPLASTY AT WRIST;  Surgeon: Milly Jakob, MD;  Location: [facility];  Service: Orthopedics;  Laterality: Right;   REPLACEMENT TOTAL KNEE BILATERAL  2014   left 2012 rt 2014       History of present illness and  Hospital Course:     Kindly see H&P for history of present illness and admission details, please review complete Labs, Consult reports and Test reports for all details in brief  HPI  from the history and physical done on the day of admission 63 year old morbidly obese male admitted because of recurrent falls, patient was discharged the day before, readmitted because of recurrent falls, shortness of breath.   Hospital Course  Epifanio Lesches, MD  Physician  Specialty:  Internal Medicine  H&P  Signed  Date of  Service:  10/25/2018 1:43 PM          Signed      Expand All Collapse All    Show:Clear all _0 Manual_1 Template_2 Copied  Added by: _3 Epifanio Lesches, MD  _4 Hover for details [facility] NAME: Booker Bhatnagar    MR#:  [id number]  DATE OF BIRTH:  06-04-1955  SUBJECTIVE: Shortness of breath improved, still has right knee pain but better with polar ice pack.  Possible discharge to rehab tomorrow.,  CHIEF COMPLAINT:      Chief Complaint  Patient presents with   Leg Injury   Shortness of Breath    REVIEW OF SYSTEMS:   Review of Systems  Constitutional: Negative for chills and fever.  HENT: Negative for hearing loss.   Eyes: Negative for blurred vision, double vision and photophobia.  Respiratory: Negative for cough, hemoptysis and shortness of breath.   Cardiovascular: Negative for palpitations, orthopnea and leg swelling.  Gastrointestinal: Negative for abdominal pain, diarrhea and vomiting.  Genitourinary: Negative for dysuria and urgency.  Musculoskeletal: Positive for joint pain. Negative for back pain, myalgias and neck pain.  Skin: Negative for rash.  Neurological: Negative for dizziness, focal weakness, seizures, weakness and headaches.  Psychiatric/Behavioral: Negative for memory loss. The patient does not have insomnia.       DRUG ALLERGIES:       Allergies  Allergen Reactions   Penicillins Anaphylaxis, Rash and Other (See Comments)   Succinylcholine Chloride Anaphylaxis and Rash   Keflex [Cephalexin] Rash   Sulfa Antibiotics Rash and Other (See Comments)    VITALS:  Blood pressure 130/72, pulse 74, temperature  97.8 F (36.6 C), temperature source Oral, resp. rate 19, height _0  (2.057 m), weight (!) 165.4 kg, SpO2 92 %.  PHYSICAL EXAMINATION:  GENERAL:  63 y.o.-year-old patient lying in the bed with no acute distress.  EYES: Pupils equal, round, reactive to light  and accommodation. No scleral icterus. Extraocular muscles intact.  HEENT: Head atraumatic, normocephalic. Oropharynx and nasopharynx clear.  NECK:  Supple, no jugular venous distention. No thyroid enlargement, no tenderness.  LUNGS: Normal breath sounds bilaterally, no wheezing, rales,rhonchi or crepitation. No use of accessory muscles of respiration.  CARDIOVASCULAR: S1, S2 normal. No murmurs, rubs, or gallops.  ABDOMEN: Soft, nontender, nondistended. Bowel sounds present. No organomegaly or mass.  EXTREMITIES: Right knee swelling, limited range of motion. NEUROLOGIC: Cranial nerves II through XII are intact. Muscle strength 5/5 in all extremities. Sensation intact. Gait not checked.  PSYCHIATRIC: The patient is alert and oriented x 3.  SKIN: No obvious rash, lesion, or ulcer.    LABORATORY PANEL:   CBC Last Labs      Recent Labs  Lab 10/23/18 0530  WBC 7.6  HGB 11.5*  HCT 36.8*  PLT 219     ------------------------------------------------------------------------------------------------------------------  Chemistries  Last Labs        Recent Labs  Lab 10/21/18 0523 10/21/18 2155 10/23/18 0530  NA 143 138 142  K 4.3 3.6 3.0*  CL 106 102 103  CO2 _1 GLUCOSE 92 172* 93  BUN _2 CREATININE 0.73 0.82 0.70  CALCIUM 8.6* 8.4* 8.3*  MG 2.3  --   --   AST  --  26  --   ALT  --  14  --   ALKPHOS  --  57  --   BILITOT  --  0.6  --      ------------------------------------------------------------------------------------------------------------------  Cardiac Enzymes Last Labs      Recent Labs  Lab 10/21/18 2155  TROPONINI <0.03     ------------------------------------------------------------------------------------------------------------------  RADIOLOGY:  Imaging Results (Last 48 hours)  No results found.    EKG:      Orders placed or performed during the hospital encounter of 10/18/18   EKG 12-Lead   EKG 12-Lead   ED EKG  12-Lead   ED EKG 12-Lead   EKG    ASSESSMENT AND PLAN:  63 year old male with morbid obesity, recently discharged readmitted as  after he was diagnosed with prepatellar bursitis, sepsis discharged home with doxycycline had a fall again, wife mentioned that she went to pick up doxycycline prescription she came back she  noted that he fell, he looked confused. #1 .multiple recurrent falls, right knee contusion, CT of the right knee did not show any hardware infection, right now he is on doxycycline,  Seen by orthopedic, quadriceps atrophy, patient have polar ice packthe need to decrease soft tissue swelling, physical therapy, apply knee immobilizer and the patient is out of bed to prevent buckling of the knee. Right kneecap bruise, seen by wound care nurse, dressing changes as per them.   Spoke to primary nurse to do dressing changes for the right knee before the discharge. [facility],  3.  Morbid obesity, sleep apnea, use CPAP at night 4.  Acute respiratory failure likely due to combination of obesity hypoventilation, clinical pneumonia, continue doxycycline, ct angio chest negative for PE, patient hypoxia resolved, spoke with patient's wife about CT angios chest results, CT of the knee results.    5.  Depression, seen by psychiatry, started  on Wellbutrin.,  Continue Wellbutrin, Cymbalta.  6.  Diabetes mellitus type 2: Restart home medications including Synjardy, Ozempic, continue long-acting insulin.  #7/ acute on chronic diastolic heart failure, improved with IV Lasix, echo showed EF more than 50%.    Patient received IV Lasix in the hospital, can resume HCTZ at discharge.    right knee wound description as per wound care nurse::full thickness wounds to middle of bruised area; approx 2X4cm affected area, largest wound is 1X1X.3cm when probed with a swab, appearance is consistent with probable  dark reddish tan drainage old blood.  Edges of raised bruised area  beginning to blister and peel in patchy areas to the edges, no odor.  . Xeroform gauze to promote drying and healing of wound         Discharge Condition: Stable   Follow UP   Contact information for follow-up providers    Horald Pollen, MD. Schedule an appointment as soon as possible for a visit in 1 week(s).   Specialty: Internal Medicine Contact information: [address] [place] [address] [phone]        Dereck Leep, MD. Schedule an appointment as soon as possible for a visit in 3 week(s).   Specialty: Orthopedic Surgery Contact information: [place] [address] [phone]            Contact information for after-discharge care    Destination    [facility] Preferred SNF .   Service: Skilled Nursing Contact information: [address] [place] [phone]                    Discharge Instructions  and  Discharge Medications      Allergies as of 10/26/2018      Reactions   Penicillins Anaphylaxis, Rash, Other (See Comments)   Succinylcholine Chloride Anaphylaxis, Rash   Keflex [cephalexin] Rash   Sulfa Antibiotics Rash, Other (See Comments)      Medication List    TAKE these medications   amLODipine 10 MG tablet Commonly known as: NORVASC Take 0.5 tablets (5 mg total) by mouth daily.   aspirin 81 MG tablet Take 1 tablet (81 mg total) by mouth daily.   buPROPion 150 MG 24 hr tablet Commonly known as: WELLBUTRIN XL Take 1 tablet (150 mg total) by mouth daily. Start taking on: October 27, 2018   carvedilol 6.25 MG tablet Commonly known as: COREG TAKE 1 TABLET BY MOUTH TWICE A DAY WITH FOOD   doxycycline 100 MG tablet Commonly known as: VIBRA-TABS Take 1 tablet (100 mg total) by mouth every 12 (twelve) hours.   DULoxetine 30 MG capsule Commonly known as: CYMBALTA Take 30 mg by mouth daily.   gabapentin 300 MG capsule Commonly  known as: NEURONTIN Take 3 capsules (900 mg total) by mouth 3 (three) times daily.   glucose blood test strip 1 each by Other route 4 (four) times daily. Use with One touch meter to check blood sugar. Dx E11.9   hydrochlorothiazide 12.5 MG tablet Commonly known as: HYDRODIURIL TAKE 1 TABLET BY MOUTH EVERY DAY   HYDROcodone-acetaminophen 5-325 MG tablet Commonly known as: NORCO/VICODIN Take 1-2 tablets by mouth every 4 (four) hours as needed for moderate pain or severe pain.   Insulin Degludec 200 UNIT/ML Sopn Inject 80 Units into the skin at bedtime.   Insulin Pen Needle 32G X 4 MM Misc Commonly known as: BD Pen Needle Nano U/F  Inject 1 each as directed 3 (three) times daily.   levofloxacin 750 MG tablet Commonly known as: LEVAQUIN Take 1 tablet (750 mg total) by mouth daily. Start taking on: October 27, 2018   losartan 100 MG tablet Commonly known as: COZAAR TAKE 1 TABLET BY MOUTH EVERY DAY   Melatonin 10 MG Tabs Take 10 mg by mouth at bedtime as needed (sleep).   multivitamin with minerals Tabs tablet Take 1 tablet by mouth daily.   omeprazole 20 MG capsule Commonly known as: PRILOSEC Take 20 mg by mouth daily.   ONE TOUCH LANCETS Misc 1 each by Does not apply route 4 (four) times daily. Use with one touch meter to check blood sugar. Dx: E11.9   ONE TOUCH ULTRA SYSTEM KIT w/Device Kit 1 kit by Does not apply route once. Dx. E11.9   OZEMPIC (1 MG/DOSE) [place] Inject 1 mg into the skin once a week.   rosuvastatin 20 MG tablet Commonly known as: CRESTOR Take 20 mg by mouth daily.   Synjardy 09-998 MG Tabs Generic drug: Empagliflozin-metFORMIN HCl Take 1 tablet by mouth 2 (two) times daily with a meal.         Diet and Activity recommendation: See Discharge Instructions above   Consults obtained ; orthopedic   Major procedures and Radiology Reports - PLEASE review detailed and final reports for all details, in brief -      Ct Chest Wo Contrast  Result  Date: 10/21/2018 CLINICAL DATA:  63 year old male with recently discharged from hospital for pneumonia admission. Worsening pneumonia was seen on chest radiograph. EXAM: CT CHEST WITHOUT CONTRAST TECHNIQUE: Multidetector CT imaging of the chest was performed following the standard protocol without IV contrast. COMPARISON:  Chest radiograph dated 10/21/2018 and CT dated 10/18/2018 FINDINGS: Evaluation of this exam is limited in the absence of intravenous contrast. Cardiovascular: There is no cardiomegaly or pericardial effusion. Multi vessel coronary vascular calcification with involvement of the LAD, RCA, and left circumflex artery. Mild atherosclerotic calcification of the thoracic aorta. Mild dilatation of the main pulmonary trunk suggestive of a degree of pulmonary hypertension. Mediastinum/Nodes: There is no hilar or mediastinal adenopathy. The esophagus is grossly unremarkable. No mediastinal fluid collection. Lungs/Pleura: Focal area of scarring in the inferior right middle lobe. The lungs are otherwise clear. There is no pleural effusion or pneumothorax. Overall interval clearance of the previously seen airspace densities. There is a punctate right middle lobe calcified granuloma. The central airways are patent. Upper Abdomen: There is a 14 mm low attenuating nodular density from the anterior aspect of the neck of the pancreas (series 2 image 143. This lesion is not characterized on this CT but appears similar to the CT of 11/29/2013, likely a benign lesion such as a side branch IPMN. There is probable mild fatty infiltration of the liver. Musculoskeletal: Degenerative changes of the spine. No acute osseous pathology. IMPRESSION: 1. No acute intrathoracic pathology. Interval resolution of the previously seen airspace densities. 2. Multi vessel coronary vascular calcification. Electronically Signed   By: Anner Crete M.D.   On: 10/21/2018 23:55   Ct Angio Chest Pe W And/or Wo Contrast  Result Date:  10/22/2018 CLINICAL DATA:  63 year old male with history of acute onset of shortness of breath. Hypoxia. EXAM: CT ANGIOGRAPHY CHEST WITH CONTRAST TECHNIQUE: Multidetector CT imaging of the chest was performed using the standard protocol during bolus administration of intravenous contrast. Multiplanar CT image reconstructions and MIPs were obtained to evaluate the vascular anatomy. CONTRAST:  41m OMNIPAQUE IOHEXOL  350 MG/ML SOLN COMPARISON:  Chest CT 10/21/2018. FINDINGS: Cardiovascular: No filling defects within the pulmonary arterial tree to suggest underlying pulmonary embolism. Heart size is normal. There is no significant pericardial fluid, thickening or pericardial calcification. There is aortic atherosclerosis, as well as atherosclerosis of the great vessels of the mediastinum and the coronary arteries, including calcified atherosclerotic plaque in the left anterior descending and right coronary arteries. Severe thickening calcifications of the aortic valve. Mild calcifications of the mitral annulus/valve. Mediastinum/Nodes: No pathologically enlarged mediastinal or hilar lymph nodes. Esophagus is unremarkable in appearance. No axillary lymphadenopathy. Lungs/Pleura: Mild diffuse ground-glass attenuation in patchy septal thickening in the lungs bilaterally, similar to recent prior examinations, favored to represent mild interstitial pulmonary edema. Nodular area of architectural distortion in the right middle lobe, similar to recent prior examinations, likely to reflect an area of post infectious or inflammatory scarring. No confluent consolidative airspace disease. No pleural effusions. Upper Abdomen: Unremarkable. Musculoskeletal: There are no aggressive appearing lytic or blastic lesions noted in the visualized portions of the skeleton. Review of the MIP images confirms the above findings. IMPRESSION: 1. No evidence of pulmonary embolism on today's examination. 2. Mild cardiomegaly with mild interstitial  pulmonary edema; imaging findings suggestive of congestive heart failure. 3. There are calcifications of the aortic valve and mitral valve/annulus. Echocardiographic correlation for evaluation of potential valvular dysfunction may be warranted if clinically indicated. 4. Aortic atherosclerosis, in addition to 2 vessel coronary artery disease. Please note that although the presence of coronary artery calcium documents the presence of coronary artery disease, the severity of this disease and any potential stenosis cannot be assessed on this non-gated CT examination. Assessment for potential risk factor modification, dietary therapy or pharmacologic therapy may be warranted, if clinically indicated. Aortic Atherosclerosis (ICD10-I70.0). Electronically Signed   By: Vinnie Langton M.D.   On: 10/22/2018 01:30   Ct Angio Chest Pe W And/or Wo Contrast  Result Date: 10/18/2018 CLINICAL DATA:  Shortness of breath, hypoxia EXAM: CT ANGIOGRAPHY CHEST WITH CONTRAST TECHNIQUE: Multidetector CT imaging of the chest was performed using the standard protocol during bolus administration of intravenous contrast. Multiplanar CT image reconstructions and MIPs were obtained to evaluate the vascular anatomy. CONTRAST:  19m OMNIPAQUE IOHEXOL 350 MG/ML SOLN COMPARISON:  CT chest 11/29/2013 FINDINGS: Cardiovascular: Satisfactory opacification of the pulmonary arteries to the lobar level. No evidence of pulmonary embolism in the main pulmonary arteries or the lobar pulmonary arteries. Segmental and subsegmental pulmonary arteries are suboptimally evaluated. Normal heart size. No pericardial effusion. Thoracic aortic atherosclerosis. Coronary artery atherosclerosis. Mediastinum/Nodes: No enlarged mediastinal, hilar, or axillary lymph nodes. Thyroid gland, trachea, and esophagus demonstrate no significant findings. Lungs/Pleura: Centrilobular emphysema. No focal consolidation, pleural effusion or pneumothorax. Patchy areas of ground-glass  in the bilateral upper lobes, lingula and right lower lobe which may be secondary to an infectious or inflammatory etiology versus air trapping. Upper Abdomen: No acute abnormality. Musculoskeletal: No acute osseous abnormality. No aggressive osseous lesion. Review of the MIP images confirms the above findings. IMPRESSION: 1. Satisfactory opacification of the pulmonary arteries to the lobar level. No evidence of pulmonary embolism in the main pulmonary arteries or the lobar pulmonary arteries. Segmental and subsegmental pulmonary arteries are suboptimally evaluated. 2. Patchy areas of ground-glass in the bilateral upper lobes, lingula and right lower lobe which may be secondary to an infectious or inflammatory etiology versus air trapping. 3. Centrilobular emphysema. 4. Aortic Atherosclerosis (ICD10-I70.0) and Emphysema (ICD10-J43.9). Electronically Signed   By: HKathreen Devoid  On:  10/18/2018 11:53   Ct Knee Right Wo Contrast  Result Date: 10/22/2018 CLINICAL DATA:  63 year old male with right knee replacement presenting with knee pain and swelling after fall. EXAM: CT OF THE right KNEE WITHOUT CONTRAST TECHNIQUE: Multidetector CT imaging of the right knee was performed according to the standard protocol. Multiplanar CT image reconstructions were also generated. COMPARISON:  Right knee radiograph dated 10/18/2018 FINDINGS: Evaluation is limited due to streak artifact caused by metallic knee arthroplasty. Bones/Joint/Cartilage There is a total right knee arthroplasty. The arthroplasty components appear intact and in anatomic alignment. No evidence of hardware loosening. No acute fracture or dislocation identified. There is no significant joint effusion. Ligaments Suboptimally assessed by CT. Muscles and Tendons No intramuscular fluid collection or hematoma. There is moderate fatty atrophy of the musculature. Soft tissues Diffuse subcutaneous soft tissue stranding and edema anterior to the knee and along the  anterior vertical surgical incision. This may be inflammatory or infectious in etiology. Clinical correlation is recommended. No definite drainable fluid collection identified. IMPRESSION: 1. No acute fracture or dislocation. 2. Total right knee arthroplasty. No evidence of hardware complication. 3. Diffuse subcutaneous soft tissue stranding and edema anterior to the knee and along the vertical surgical incision. Clinical correlation is recommended to evaluate for infection. No drainable fluid collection identified. Electronically Signed   By: Anner Crete M.D.   On: 10/22/2018 00:02   Dg Chest Portable 1 View  Result Date: 10/21/2018 CLINICAL DATA:  63 year old male with worsening shortness of breath, low oxygen saturations and fever. EXAM: PORTABLE CHEST 1 VIEW COMPARISON:  Chest x-ray 10/18/2018. FINDINGS: Low lung volumes. Patchy ill-defined opacities and areas of interstitial prominence. No pleural effusions. Crowding of the pulmonary vasculature, without frank pulmonary edema. Heart size is upper limits of normal. Upper mediastinal contours are within normal limits. IMPRESSION: 1. Patchy ill-defined multifocal interstitial and airspace opacities concerning for developing multilobar pneumonia. Electronically Signed   By: Vinnie Langton M.D.   On: 10/21/2018 22:30   Dg Chest Port 1 View  Result Date: 10/18/2018 CLINICAL DATA:  63 year old male status post fall 4 days ago. Shortness of breath and fever. EXAM: PORTABLE CHEST 1 VIEW COMPARISON:  Chest radiographs 05/31/2017 and earlier. FINDINGS: Portable AP upright view at 0939 hours. Stable lung volumes with mild elevation of the right hemidiaphragm, normal variant. Normal cardiac size and mediastinal contours. Visualized tracheal air column is within normal limits. Allowing for portable technique the lungs are clear. No pneumothorax or pleural effusion. Paucity of bowel gas in the upper abdomen. No acute osseous abnormality identified. IMPRESSION:  Negative portable chest. Electronically Signed   By: Genevie Ann M.D.   On: 10/18/2018 10:08   Dg Knee Right Port  Result Date: 10/18/2018 CLINICAL DATA:  63 year old male status post fall 4 days ago and again today. Pain. EXAM: PORTABLE RIGHT KNEE - 1-2 VIEW COMPARISON:  Right knee series 06/23/2012. FINDINGS: Chronic right total knee arthroplasty. Hardware appears stable since 2014 and intact. 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 Chronic exostosis from the medial femoral metadiaphysis is stable. No joint effusion is evident on cross-table lateral view. No acute osseous abnormality identified. IMPRESSION: No acute fracture or dislocation identified about the right knee, and hardware intact. Electronically Signed   By: Genevie Ann M.D.   On: 10/18/2018 10:10   Dg Hip Unilat W Or Wo Pelvis 2-3 Views Left  Result Date: 10/18/2018 CLINICAL DATA:  Left hip pain after fall 3 days ago. EXAM: DG HIP (WITH OR WITHOUT PELVIS)  2-3V LEFT COMPARISON:  None. FINDINGS: There is no evidence of hip fracture or dislocation. Moderate narrowing of the left hip joint is noted. IMPRESSION: Moderate osteoarthritis of the left hip.  No acute abnormality seen. Electronically Signed   By: Marijo Conception M.D.   On: 10/18/2018 12:43    Micro Results     Recent Results (from the past 240 hour(s))  Blood Culture (routine x 2)     Status: None   Collection Time: 10/18/18  9:37 AM   Specimen: BLOOD  Result Value Ref Range Status   Specimen Description BLOOD BLOOD RIGHT FOREARM  Final   Special Requests   Final    BOTTLES DRAWN AEROBIC AND ANAEROBIC Blood Culture results may not be optimal due to an excessive volume of blood received in culture bottles   Culture   Final    NO GROWTH 5 DAYS Performed at [facility], [address] [place], [place] [address]    Report Status 10/23/2018 FINAL  Final  Blood Culture (routine x 2)     Status: None   Collection Time: 10/18/18  9:37 AM   Specimen: BLOOD    Result Value Ref Range Status   Specimen Description BLOOD BLOOD RIGHT WRIST  Final   Special Requests   Final    BOTTLES DRAWN AEROBIC AND ANAEROBIC Blood Culture results may not be optimal due to an excessive volume of blood received in culture bottles   Culture   Final    NO GROWTH 5 DAYS Performed at [facility], [address] [place],  [address]    Report Status 10/23/2018 FINAL  Final  SARS Coronavirus 2 (CEPHEID- Performed in [place] hospital lab), Hosp Order     Status: None   Collection Time: 10/18/18  9:37 AM   Specimen: Nasopharyngeal Swab  Result Value Ref Range Status   SARS Coronavirus 2 NEGATIVE NEGATIVE Final    Comment: (NOTE) If result is NEGATIVE SARS-CoV-2 target nucleic acids are NOT DETECTED. The SARS-CoV-2 RNA is generally detectable in upper and lower  respiratory specimens during the acute phase of infection. The lowest  concentration of SARS-CoV-2 viral copies this assay can detect is 250  copies / mL. A negative result does not preclude SARS-CoV-2 infection  and should not be used as the sole basis for treatment or other  patient management decisions.  A negative result may occur with  improper specimen collection / handling, submission of specimen other  than nasopharyngeal swab, presence of viral mutation(s) within the  areas targeted by this assay, and inadequate number of viral copies  (<250 copies / mL). A negative result must be combined with clinical  observations, patient history, and epidemiological information. If result is POSITIVE SARS-CoV-2 target nucleic acids are DETECTED. The SARS-CoV-2 RNA is generally detectable in upper and lower  respiratory specimens dur ing the acute phase of infection.  Positive  results are indicative of active infection with SARS-CoV-2.  Clinical  correlation with patient history and other diagnostic information is  necessary to determine patient infection status.  Positive results do   not rule out bacterial infection or co-infection with other viruses. If result is PRESUMPTIVE POSTIVE SARS-CoV-2 nucleic acids MAY BE PRESENT.   A presumptive positive result was obtained on the submitted specimen  and confirmed on repeat testing.  While 2019 novel coronavirus  (SARS-CoV-2) nucleic acids may be present in the submitted sample  additional confirmatory testing may be necessary for epidemiological  and /  or clinical management purposes  to differentiate between  SARS-CoV-2 and other Sarbecovirus currently known to infect humans.  If clinically indicated additional testing with an alternate test  methodology [phone]) is advised. The SARS-CoV-2 RNA is generally  detectable in upper and lower respiratory sp ecimens during the acute  phase of infection. The expected result is Negative. Fact Sheet for Patients:  StrictlyIdeas.no Fact Sheet for Healthcare Providers: BankingDealers.co.za This test is not yet approved or cleared by the [place] FDA and has been authorized for detection and/or diagnosis of SARS-CoV-2 by FDA under an Emergency Use Authorization (EUA).  This EUA will remain in effect (meaning this test can be used) for the duration of the COVID-19 declaration under Section 564(b)(1) of the Act, 21 U.S.C. section 360bbb-3(b)(1), unless the authorization is terminated or revoked sooner. Performed at [facility], [address] [place], [place] [address]   Urine culture     Status: None   Collection Time: 10/18/18  1:15 PM   Specimen: Urine, Clean Catch  Result Value Ref Range Status   Specimen Description   Final    URINE, CLEAN CATCH Performed at [facility], [address], [place], [place] [address]    Special Requests NONE  Final   Culture   Final    NO GROWTH Performed at [facility], [place] [address] [place], [place] [address]    Report Status 10/19/2018 FINAL  Final   Respiratory Panel by PCR     Status: None   Collection Time: 10/18/18  1:16 PM   Specimen: Nasopharyngeal Swab; Respiratory  Result Value Ref Range Status   Adenovirus NOT DETECTED NOT DETECTED Final   Coronavirus 229E NOT DETECTED NOT DETECTED Final    Comment: (NOTE) The Coronavirus on the Respiratory Panel, DOES NOT test for the novel  Coronavirus (2019 nCoV)    Coronavirus HKU1 NOT DETECTED NOT DETECTED Final   Coronavirus NL63 NOT DETECTED NOT DETECTED Final   Coronavirus OC43 NOT DETECTED NOT DETECTED Final   Metapneumovirus NOT DETECTED NOT DETECTED Final   Rhinovirus / Enterovirus NOT DETECTED NOT DETECTED Final   Influenza A NOT DETECTED NOT DETECTED Final   Influenza B NOT DETECTED NOT DETECTED Final   Parainfluenza Virus 1 NOT DETECTED NOT DETECTED Final   Parainfluenza Virus 2 NOT DETECTED NOT DETECTED Final   Parainfluenza Virus 3 NOT DETECTED NOT DETECTED Final   Parainfluenza Virus 4 NOT DETECTED NOT DETECTED Final   Respiratory Syncytial Virus NOT DETECTED NOT DETECTED Final   Bordetella pertussis NOT DETECTED NOT DETECTED Final   Chlamydophila pneumoniae NOT DETECTED NOT DETECTED Final   Mycoplasma pneumoniae NOT DETECTED NOT DETECTED Final    Comment: Performed at [facility], [place] [address], [place], [place] [address]  MRSA PCR Screening     Status: None   Collection Time: 10/19/18  2:44 PM   Specimen: Nasal Mucosa; Nasopharyngeal  Result Value Ref Range Status   MRSA by PCR NEGATIVE NEGATIVE Final    Comment:        The GeneXpert MRSA Assay (FDA approved for NASAL specimens only), is one component of a comprehensive MRSA colonization surveillance program. It is not intended to diagnose MRSA infection nor to guide or monitor treatment for MRSA infections. Performed at [facility], [address] [place],  [address]   Culture, blood (Routine x 2)     Status: None   Collection Time: 10/21/18  9:55 PM   Specimen: BLOOD  Result  Value Ref Range Status   Specimen Description BLOOD BLOOD LEFT FOREARM  Final   Special Requests   Final    BOTTLES DRAWN AEROBIC AND ANAEROBIC Blood Culture results may not be optimal due to an excessive volume of blood received in culture bottles   Culture   Final    NO GROWTH 5 DAYS Performed at [facility], [address] [place], [place] [address]    Report Status 10/26/2018 FINAL  Final  Culture, blood (Routine x 2)     Status: None   Collection Time: 10/21/18  9:55 PM   Specimen: BLOOD  Result Value Ref Range Status   Specimen Description BLOOD RIGHT ANTECUBITAL  Final   Special Requests   Final    BOTTLES DRAWN AEROBIC AND ANAEROBIC Blood Culture results may not be optimal due to an excessive volume of blood received in culture bottles   Culture   Final    NO GROWTH 5 DAYS Performed at [facility], [address], [place], [place] [address]    Report Status 10/26/2018 FINAL  Final  SARS Coronavirus 2 (CEPHEID - Performed in [place] hospital lab), Hosp Order     Status: None   Collection Time: 10/22/18  1:48 AM   Specimen: Nasopharyngeal Swab  Result Value Ref Range Status   SARS Coronavirus 2 NEGATIVE NEGATIVE Final    Comment: (NOTE) If result is NEGATIVE SARS-CoV-2 target nucleic acids are NOT DETECTED. The SARS-CoV-2 RNA is generally detectable in upper and lower  respiratory specimens during the acute phase of infection. The lowest  concentration of SARS-CoV-2 viral copies this assay can detect is 250  copies / mL. A negative result does not preclude SARS-CoV-2 infection  and should not be used as the sole basis for treatment or other  patient management decisions.  A negative result may occur with  improper specimen collection / handling, submission of specimen other  than nasopharyngeal swab, presence of viral mutation(s) within the  areas targeted by this assay, and inadequate number of viral copies  (<250 copies / mL). A negative result  must be combined with clinical  observations, patient history, and epidemiological information. If result is POSITIVE SARS-CoV-2 target nucleic acids are DETECTED. The SARS-CoV-2 RNA is generally detectable in upper and lower  respiratory specimens dur ing the acute phase of infection.  Positive  results are indicative of active infection with SARS-CoV-2.  Clinical  correlation with patient history and other diagnostic information is  necessary to determine patient infection status.  Positive results do  not rule out bacterial infection or co-infection with other viruses. If result is PRESUMPTIVE POSTIVE SARS-CoV-2 nucleic acids MAY BE PRESENT.   A presumptive positive result was obtained on the submitted specimen  and confirmed on repeat testing.  While 2019 novel coronavirus  (SARS-CoV-2) nucleic acids may be present in the submitted sample  additional confirmatory testing may be necessary for epidemiological  and / or clinical management purposes  to differentiate between  SARS-CoV-2 and other Sarbecovirus currently known to infect humans.  If clinically indicated additional testing with an alternate test  methodology [phone]) is advised. The SARS-CoV-2 RNA is generally  detectable in upper and lower respiratory sp ecimens during the acute  phase of infection. The expected result is Negative. Fact Sheet for Patients:  StrictlyIdeas.no Fact Sheet for Healthcare Providers: BankingDealers.co.za This test is not yet approved or cleared by the [place] FDA and has been authorized for detection and/or diagnosis of  SARS-CoV-2 by FDA under an Emergency Use Authorization (EUA).  This EUA will remain in effect (meaning this test can be used) for the duration of the COVID-19 declaration under Section 564(b)(1) of the Act, 21 U.S.C. section 360bbb-3(b)(1), unless the authorization is terminated or revoked sooner. Performed at [facility], [address] [place], [place] [address]   Wound or Superficial Culture     Status: None   Collection Time: 10/22/18  2:40 AM   Specimen: Wound  Result Value Ref Range Status   Specimen Description   Final    WOUND RIGHT KNEE Performed at [facility], [address], [place], [place] [address]    Special Requests   Final    Normal Performed at [facility], [address] [place], [place] [address]    Gram Stain   Final    NO WBC SEEN NO ORGANISMS SEEN Performed at [facility], [place] [address] [place], [place] [address]    Culture RARE STAPHYLOCOCCUS AUREUS  Final   Report Status 10/24/2018 FINAL  Final   Organism ID, Bacteria STAPHYLOCOCCUS AUREUS  Final      Susceptibility   Staphylococcus aureus - MIC*    CIPROFLOXACIN <=0.5 SENSITIVE Sensitive     ERYTHROMYCIN >=8 RESISTANT Resistant     GENTAMICIN <=0.5 SENSITIVE Sensitive     OXACILLIN <=0.25 SENSITIVE Sensitive     TETRACYCLINE <=1 SENSITIVE Sensitive     VANCOMYCIN 1 SENSITIVE Sensitive     TRIMETH/SULFA <=10 SENSITIVE Sensitive     CLINDAMYCIN RESISTANT Resistant     RIFAMPIN <=0.5 SENSITIVE Sensitive     Inducible Clindamycin POSITIVE Resistant     * RARE STAPHYLOCOCCUS AUREUS  Novel Coronavirus, NAA (hospital order; send-out to ref lab)     Status: None   Collection Time: 10/25/18  3:34 PM   Specimen: Nasopharyngeal Swab; Respiratory  Result Value Ref Range Status   SARS-CoV-2, NAA NOT DETECTED NOT DETECTED Final    Comment: (NOTE) This test was developed and its performance characteristics determined by [employer]. This test has not been FDA cleared or approved. This test has been authorized by FDA under an Emergency Use Authorization (EUA). This test is only authorized for the duration of time the declaration that circumstances exist justifying the authorization of the emergency use of in vitro diagnostic tests for detection of SARS-CoV-2 virus and/or  diagnosis of COVID-19 infection under section 564(b)(1) of the Act, 21 U.S.C. 704UGQ-9(V)(6), unless the authorization is terminated or revoked sooner. When diagnostic testing is negative, the possibility of a false negative result should be considered in the context of a patient's recent exposures and the presence of clinical signs and symptoms consistent with COVID-19. An individual without symptoms of COVID-19 and who is not shedding SARS-CoV-2 virus would expect to have a negative (not detected) result in this assay. Performed  At: [facility] [address] [place], [place] [id number] Rush Farmer MD CM:[id number]    [place]  Final    Comment: Performed at [facility], [address] [place], [place] [address]       Today   Subjective:   [place] is stable for discharge  Objective:   Blood pressure 123/66, pulse 67, temperature 97.7 F (36.5 C), temperature source Oral, resp. rate 16, height _0  (2.057 m), weight (!) 165.4 kg, SpO2 99 %.   Intake/Output Summary (Last 24 hours) at 10/26/2018 1218 Last data filed at 10/26/2018 1056 Gross per 24 hour  Intake 360 ml  Output 950 ml  Net -590 ml    Exam Awake Alert, Oriented x 3, No new F.N deficits, Normal affect [place].AT,PERRAL Supple Neck,No JVD, No cervical lymphadenopathy appriciated.  Symmetrical Chest wall movement, Good air movement bilaterally, CTAB RRR,No Gallops,Rubs or new Murmurs, No Parasternal Heave +ve B.Sounds, Abd Soft, Non tender, No organomegaly appriciated, No rebound -guarding or rigidity. Right knee swelling, pole icepack present.  Data Review   CBC w Diff:  Lab Results  Component Value Date   WBC 7.6 10/23/2018   HGB 11.5 (L) 10/23/2018   HGB 16.0 12/23/2014   HCT 36.8 (L) 10/23/2018   HCT 49.2 12/23/2014   PLT 219 10/23/2018   PLT 202 12/23/2014   LYMPHOPCT 11 10/21/2018   MONOPCT 10 10/21/2018   EOSPCT 6 10/21/2018   BASOPCT 1  10/21/2018    CMP:  Lab Results  Component Value Date   NA 139 10/26/2018   NA 140 07/20/2017   NA 135 (L) 06/25/2012   K 3.5 10/26/2018   K 3.8 06/25/2012   CL 99 10/26/2018   CL 100 06/25/2012   CO2 32 10/26/2018   CO2 28 06/25/2012   BUN 21 10/26/2018   BUN 24 07/20/2017   BUN 9 06/25/2012   CREATININE 0.68 10/26/2018   CREATININE 0.66 06/25/2012   PROT 6.7 10/21/2018   PROT 6.7 05/04/2018   ALBUMIN 3.0 (L) 10/21/2018   ALBUMIN 4.3 07/20/2017   BILITOT 0.6 10/21/2018   BILITOT 0.3 07/20/2017   ALKPHOS 57 10/21/2018   AST 26 10/21/2018   ALT 14 10/21/2018  .   Total Time in preparing paper work, data evaluation and todays exam - 35 minutes  Epifanio Lesches M.D on 10/26/2018 at 12:18 PM    Note: This dictation was prepared with Dragon dictation along with smaller phrase technology. Any transcriptional errors that result from this process are unintentional.

## 2018-10-26 NOTE — Discharge Instructions (Signed)

## 2018-11-07 ENCOUNTER — Inpatient Hospital Stay: Payer: Managed Care, Other (non HMO) | Admitting: Emergency Medicine

## 2018-11-14 ENCOUNTER — Other Ambulatory Visit: Payer: Self-pay | Admitting: Emergency Medicine

## 2018-11-14 DIAGNOSIS — I10 Essential (primary) hypertension: Secondary | ICD-10-CM

## 2018-11-22 ENCOUNTER — Ambulatory Visit
Admission: EM | Admit: 2018-11-22 | Discharge: 2018-11-22 | Disposition: A | Discharge status: Home / Self Care | Payer: Managed Care, Other (non HMO) | Attending: Family Medicine | Admitting: Family Medicine

## 2018-11-22 ENCOUNTER — Other Ambulatory Visit: Payer: Self-pay

## 2018-11-22 ENCOUNTER — Encounter: Payer: Self-pay | Admitting: Emergency Medicine

## 2018-11-22 DIAGNOSIS — W0110XA Fall on same level from slipping, tripping and stumbling with subsequent striking against unspecified object, initial encounter: Secondary | ICD-10-CM

## 2018-11-22 DIAGNOSIS — S0101XA Laceration without foreign body of scalp, initial encounter: Secondary | ICD-10-CM | POA: Diagnosis not present

## 2018-11-22 MED ORDER — MUPIROCIN 2 % EX OINT: 1.0000 "application " | TOPICAL_OINTMENT | Freq: Two times a day (BID) | CUTANEOUS | 0 refills | Status: AC

## 2018-11-22 NOTE — ED Provider Notes (Signed)
[facility]    CSN: [id number] Arrival date & time: 11/22/18  1218  History   Chief Complaint Chief Complaint  Patient presents with  . Head Laceration   HPI  63 year old male presents with a scalp laceration.  Patient suffered a scalp laceration last night around 11 PM.  He states that he fell in his bedroom and hit his head on a dresser.  States that he had a lot of bleeding.  Bleeding well controlled currently.  Mild pain - 2/10 in severity. No drainage from the wound.  No loss of consciousness.  No vision changes.  He is currently feeling well.  No other associated symptoms.  No other complaints.  PMH, Surgical Hx, Family Hx, Social History reviewed and updated as below.  Past Medical History:  Diagnosis Date  . Diabetes ([place])   . GERD (gastroesophageal reflux disease)   . Hyperlipidemia   . Hypertension   . Sleep apnea    Patient Active Problem List   Diagnosis Date Noted  . Hypoxia 10/22/2018  . Infection of right prepatellar bursa 10/18/2018  . Sepsis ([place]) 10/18/2018  . Chest congestion 07/20/2017  . Viral illness 07/20/2017  . Lower respiratory infection 07/12/2017  . Laryngitis, acute 07/12/2017  . Cough 07/12/2017  . Pleurisy 07/12/2017  . Depression 06/03/2015  . HTN (hypertension) 01/02/2015  . Hyperlipidemia 01/02/2015  . 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 Sleep apnea 09/21/2010  . Arthritis, degenerative 02/17/2009  . Hereditary and idiopathic peripheral neuropathy 11/21/2008  . AD (atopic dermatitis) 07/08/2008  . Acid reflux 04/05/2007   Past Surgical History:  Procedure Laterality Date  . CARPAL TUNNEL RELEASE Right 12/28/2016   Procedure: RIGHT ULNAR AND MEDIAN NEUROPLASTY AT WRIST;  Surgeon: Milly Jakob, MD;  Location: [facility];  Service: Orthopedics;  Laterality: Right;  . REPLACEMENT TOTAL KNEE BILATERAL  2014   left 2012 rt 2014   Home Medications    Prior  to Admission medications   Medication Sig Start Date End Date Taking? Authorizing Provider  amLODipine (NORVASC) 10 MG tablet Take 0.5 tablets (5 mg total) by mouth daily. 11/15/17  Yes Sagardia, Ines Bloomer, MD  aspirin 81 MG tablet Take 1 tablet (81 mg total) by mouth daily. 08/11/16  Yes Karamalegos, Devonne Doughty, DO  Blood Glucose Monitoring Suppl (ONE TOUCH ULTRA SYSTEM KIT) w/Device KIT 1 kit by Does not apply route once. Dx. E11.9 07/28/15  Yes Arlis Porta., MD  buPROPion (WELLBUTRIN XL) 150 MG 24 hr tablet Take 1 tablet (150 mg total) by mouth daily. 10/27/18  Yes Epifanio Lesches, MD  carvedilol (COREG) 6.25 MG tablet TAKE 1 TABLET BY MOUTH TWICE A DAY WITH FOOD 10/21/18  Yes Sagardia, Ines Bloomer, MD  DULoxetine (CYMBALTA) 30 MG capsule Take 30 mg by mouth daily. 07/24/18  Yes [provider]  gabapentin (NEURONTIN) 300 MG capsule Take 3 capsules (900 mg total) by mouth 3 (three) times daily. 10/21/18  Yes Wieting, Richard, MD  glucose blood test strip 1 each by Other route 4 (four) times daily. Use with One touch meter to check blood sugar. Dx E11.9 09/13/16  Yes Mikey College, NP  hydrochlorothiazide (HYDRODIURIL) 12.5 MG tablet TAKE 1 TABLET BY MOUTH EVERY DAY Patient taking differently: Take 12.5 mg by mouth daily.  06/15/18  Yes Sagardia, Ines Bloomer, MD  HYDROcodone-acetaminophen (NORCO/VICODIN) 5-325 MG tablet Take 1-2 tablets by mouth every 4 (four) hours as needed for  moderate pain or severe pain. 10/26/18  Yes Epifanio Lesches, MD  Insulin Degludec 200 UNIT/ML SOPN Inject 80 Units into the skin at bedtime.    Yes [provider]  Insulin Pen Needle (BD PEN NEEDLE NANO U/F) 32G X 4 MM MISC Inject 1 each as directed 3 (three) times daily. 04/18/18  Yes Stallings, Zoe A, MD  losartan (COZAAR) 100 MG tablet TAKE 1 TABLET BY MOUTH EVERY DAY Patient taking differently: Take 100 mg by mouth daily.  08/16/18  Yes Sagardia, Ines Bloomer, MD  Melatonin 10 MG TABS  Take 10 mg by mouth at bedtime as needed (sleep).   Yes [provider]  Multiple Vitamin (MULTIVITAMIN WITH MINERALS) TABS tablet Take 1 tablet by mouth daily.   Yes [provider]  omeprazole (PRILOSEC) 20 MG capsule Take 20 mg by mouth daily.    Yes [provider]  ONE TOUCH LANCETS MISC 1 each by Does not apply route 4 (four) times daily. Use with one touch meter to check blood sugar. Dx: E11.9 09/16/16  Yes Mikey College, NP  rosuvastatin (CRESTOR) 20 MG tablet Take 20 mg by mouth daily.   Yes [provider]  Semaglutide (OZEMPIC, 1 MG/DOSE, [place]) Inject 1 mg into the skin once a week.    Yes [provider]  SYNJARDY 09-998 MG TABS Take 1 tablet by mouth 2 (two) times daily with a meal. 07/06/18  Yes [provider]  mupirocin ointment (BACTROBAN) 2 % Apply 1 application topically 2 (two) times daily for 7 days. 11/22/18 11/29/18  Coral Spikes, DO    Family History Family History  Problem Relation Age of Onset  . Ovarian cancer Mother   . Kidney failure Father   . Cancer Brother     Social History Social History   Tobacco Use  . Smoking status: Former Smoker    Packs/day: 2.00    Years: 20.00    Pack years: 40.00    Types: Cigarettes    Quit date: 06/28/1996    Years since quitting: 22.4  . Smokeless tobacco: Never Used  Substance Use Topics  . Alcohol use: Yes    Alcohol/week: 0.0 standard drinks    Comment: socially  . Drug use: No    Allergies   Penicillins, Succinylcholine chloride, Keflex [cephalexin], and Sulfa antibiotics   Review of Systems Review of Systems  Constitutional: Negative.   Skin: Positive for wound.  Neurological: Negative.    Physical Exam Triage Vital Signs ED Triage Vitals  Enc Vitals Group     BP 11/22/18 1231 121/78     Pulse Rate 11/22/18 1231 84     Resp 11/22/18 1231 18     Temp 11/22/18 1231 98.5 F (36.9 C)     Temp Source 11/22/18 1231 Oral     SpO2 11/22/18 1231 98  %     Weight 11/22/18 1230 (!) 365 lb (165.6 kg)     Height 11/22/18 1230 6' 9"  (2.057 m)     Head Circumference --      Peak Flow --      Pain Score 11/22/18 1229 2     Pain Loc --      Pain Edu? --      Excl. in [place]? --    Updated Vital Signs BP 121/78 (BP Location: Right Arm)   Pulse 84   Temp 98.5 F (36.9 C) (Oral)   Resp 18   Ht 6' 9"  (2.057 m)  Wt (!) 165.6 kg   SpO2 98%   BMI 39.11 kg/m   Visual Acuity Right Eye Distance:   Left Eye Distance:   Bilateral Distance:    Right Eye Near:   Left Eye Near:    Bilateral Near:     Physical Exam Vitals signs and nursing note reviewed.  Constitutional:      General: He is not in acute distress.    Appearance: Normal appearance. He is obese.  HENT:     Head:      Comments: 4 cm laceration at the labelled location. Well approximated. No bleeding. Eyes:     General:        Right eye: No discharge.        Left eye: No discharge.     Conjunctiva/sclera: Conjunctivae normal.  Cardiovascular:     Rate and Rhythm: Normal rate and regular rhythm.  Pulmonary:     Effort: Pulmonary effort is normal. No respiratory distress.  Neurological:     Mental Status: He is alert.  Psychiatric:        Mood and Affect: Mood normal.        Behavior: Behavior normal.    UC Treatments / Results  Labs (all labs ordered are listed, but only abnormal results are displayed) Labs Reviewed - No data to display  EKG   Radiology No results found.  Procedures Procedures (including critical care time)  Medications Ordered in UC Medications - No data to display  Initial Impression / Assessment and Plan / UC Course  I have reviewed the triage vital signs and the nursing notes.  Pertinent labs & imaging results that were available during my care of the patient were reviewed by me and considered in my medical decision making (see chart for details).    63 year old male presents with scalp laceration.  No indications for repair at  this time as it is well approximated and is healing.  Advised to keep clean.  Bactroban ointment as prescribed.  Supportive care.  Final Clinical Impressions(s) / UC Diagnoses   Final diagnoses:  Laceration of scalp, initial encounter     Discharge Instructions     Antibiotic ointment as prescribed.  Keep clean.  Take care  Dr. Lacinda Axon    ED Prescriptions    Medication Sig Dispense Auth. Provider   mupirocin ointment (BACTROBAN) 2 % Apply 1 application topically 2 (two) times daily for 7 days. 22 g Coral Spikes, DO     Controlled Substance Prescriptions [place] Controlled Substance Registry consulted? Not Applicable   Coral Spikes, DO 11/22/18 1348

## 2018-11-22 NOTE — Discharge Instructions (Signed)
Antibiotic ointment as prescribed.  Keep clean.  Take care  Dr. Lacinda Axon

## 2018-11-22 NOTE — ED Triage Notes (Signed)
Patient states he tripped last night at 11pm and fell hitting his head on a dresser.

## 2019-01-07 ENCOUNTER — Other Ambulatory Visit: Payer: Self-pay | Admitting: Emergency Medicine

## 2019-01-07 DIAGNOSIS — I10 Essential (primary) hypertension: Secondary | ICD-10-CM

## 2019-01-08 NOTE — Telephone Encounter (Signed)
Last appointment was 05/26/2018 Last refilled on 11/15/2017 Left vm for patient to call office and schedule appointment  Requested Prescriptions  Pending Prescriptions Disp Refills  . amLODipine (NORVASC) 10 MG tablet [Pharmacy Med Name: AMLODIPINE BESYLATE 10 MG TAB] 45 tablet 3    Sig: TAKE 0.5 TABLETS (5 MG TOTAL) BY MOUTH DAILY.     Cardiovascular:  Calcium Channel Blockers Failed - 01/07/2019  6:44 PM      Failed - Valid encounter within last 6 months    Recent Outpatient Visits          7 months ago Acute non-recurrent maxillary sinusitis   Primary Care at [facility], Ines Bloomer, MD   8 months ago Dermatitis   Primary Care at [facility], Arlie Solomons, MD   9 months ago Paronychia of finger of right hand   Primary Care at [facility], Arlie Solomons, MD   9 months ago Paronychia of finger of right hand   [facility] Care at [facility], Arlie Solomons, MD   10 months ago Pain in both hands   Primary Care at [facility], [place], MD             [place] BP in normal range    BP Readings from Last 1 Encounters:  11/22/18 121/78

## 2019-01-08 NOTE — Telephone Encounter (Signed)
Will refill but needs OV.

## 2019-01-15 ENCOUNTER — Other Ambulatory Visit: Payer: Self-pay

## 2019-01-15 DIAGNOSIS — Z20822 Contact with and (suspected) exposure to covid-19: Secondary | ICD-10-CM

## 2019-01-16 ENCOUNTER — Other Ambulatory Visit: Payer: Self-pay | Admitting: Neurosurgery

## 2019-01-16 DIAGNOSIS — M4722 Other spondylosis with radiculopathy, cervical region: Secondary | ICD-10-CM

## 2019-01-17 LAB — NOVEL CORONAVIRUS, NAA: SARS-CoV-2, NAA: NOT DETECTED

## 2019-02-06 ENCOUNTER — Other Ambulatory Visit: Payer: Self-pay | Admitting: Emergency Medicine

## 2019-02-06 DIAGNOSIS — I10 Essential (primary) hypertension: Secondary | ICD-10-CM

## 2019-02-06 NOTE — Telephone Encounter (Signed)
Requested medication (s) are due for refill today: yes  Requested medication (s) are on the active medication list: yes  Last refill:  01/14/2019  Future visit scheduled: no  Notes to clinic:  Review for refill   Requested Prescriptions  Pending Prescriptions Disp Refills   carvedilol (COREG) 6.25 MG tablet [Pharmacy Med Name: CARVEDILOL 6.25 MG TABLET] 60 tablet 0    Sig: TAKE 1 TABLET BY MOUTH TWICE A DAY WITH FOOD     Cardiovascular:  Beta Blockers Failed - 02/06/2019  1:25 AM      Failed - Valid encounter within last 6 months    Recent Outpatient Visits          8 months ago Acute non-recurrent maxillary sinusitis   Primary Care at [facility], Ines Bloomer, MD   9 months ago Dermatitis   Primary Care at [facility], Arlie Solomons, MD   10 months ago Paronychia of finger of right hand   Primary Care at [facility], Arlie Solomons, MD   10 months ago Paronychia of finger of right hand   Primary Care at [facility], Arlie Solomons, MD   11 months ago Pain in both hands   Primary Care at [place], [place], MD             [place] BP in normal range    BP Readings from Last 1 Encounters:  11/22/18 121/78         Passed - Last Heart Rate in normal range    Pulse Readings from Last 1 Encounters:  11/22/18 84

## 2019-02-10 ENCOUNTER — Ambulatory Visit
Admission: RE | Admit: 2019-02-10 | Discharge: 2019-02-10 | Disposition: A | Discharge status: Home / Self Care | Payer: Managed Care, Other (non HMO) | Source: Ambulatory Visit | Attending: Neurosurgery | Admitting: Neurosurgery

## 2019-02-10 ENCOUNTER — Other Ambulatory Visit: Payer: Self-pay

## 2019-02-10 DIAGNOSIS — M4722 Other spondylosis with radiculopathy, cervical region: Secondary | ICD-10-CM

## 2019-02-23 ENCOUNTER — Other Ambulatory Visit: Payer: Self-pay | Admitting: Emergency Medicine

## 2019-02-23 DIAGNOSIS — I10 Essential (primary) hypertension: Secondary | ICD-10-CM

## 2019-02-23 NOTE — Telephone Encounter (Signed)
Requested medication (s) are due for refill today: yes  Requested medication (s) are on the active medication list: yes  Last refill:  01/14/2019  Future visit scheduled: no  Notes to clinic:  Looks like patient is seeing another provider  Review for refill    Requested Prescriptions  Pending Prescriptions Disp Refills   carvedilol (COREG) 6.25 MG tablet [Pharmacy Med Name: CARVEDILOL 6.25 MG TABLET] 60 tablet 0    Sig: TAKE 1 TABLET BY MOUTH TWICE A DAY WITH FOOD     Cardiovascular:  Beta Blockers Failed - 02/23/2019 11:44 AM      Failed - Valid encounter within last 6 months    Recent Outpatient Visits          9 months ago Acute non-recurrent maxillary sinusitis   Primary Care at [facility], Ines Bloomer, MD   10 months ago Dermatitis   Primary Care at [facility], Arlie Solomons, MD   11 months ago Paronychia of finger of right hand   Primary Care at [facility], Arlie Solomons, MD   11 months ago Paronychia of finger of right hand   Primary Care at [facility], Arlie Solomons, MD   11 months ago Pain in both hands   Primary Care at [place], [place], MD             [place] BP in normal range    BP Readings from Last 1 Encounters:  11/22/18 121/78         Passed - Last Heart Rate in normal range    Pulse Readings from Last 1 Encounters:  11/22/18 84

## 2019-02-27 ENCOUNTER — Other Ambulatory Visit: Payer: Self-pay | Admitting: Emergency Medicine

## 2019-02-27 DIAGNOSIS — I10 Essential (primary) hypertension: Secondary | ICD-10-CM

## 2019-02-27 NOTE — Telephone Encounter (Signed)
Requested medication (s) are due for refill today: yes  Requested medication (s) are on the active medication list: yes  Last refill:  01/14/2019  Future visit scheduled:no  Notes to clinic: patient overdue for office visit Review for refill   Requested Prescriptions  Pending Prescriptions Disp Refills   carvedilol (COREG) 6.25 MG tablet [Pharmacy Med Name: CARVEDILOL 6.25 MG TABLET] 60 tablet 0    Sig: TAKE 1 TABLET BY MOUTH TWICE A DAY WITH FOOD     Cardiovascular:  Beta Blockers Failed - 02/27/2019  1:06 PM      Failed - Valid encounter within last 6 months    Recent Outpatient Visits          9 months ago Acute non-recurrent maxillary sinusitis   Primary Care at [facility], Ines Bloomer, MD   10 months ago Dermatitis   Primary Care at [facility], Arlie Solomons, MD   11 months ago Paronychia of finger of right hand   Primary Care at [facility], Arlie Solomons, MD   11 months ago Paronychia of finger of right hand   Primary Care at [facility], Arlie Solomons, MD   12 months ago Pain in both hands   Primary Care at [place], [place], MD             [place] BP in normal range    BP Readings from Last 1 Encounters:  11/22/18 121/78         Passed - Last Heart Rate in normal range    Pulse Readings from Last 1 Encounters:  11/22/18 84

## 2019-03-01 ENCOUNTER — Other Ambulatory Visit: Payer: Self-pay | Admitting: Emergency Medicine

## 2019-03-01 DIAGNOSIS — I10 Essential (primary) hypertension: Secondary | ICD-10-CM

## 2019-05-01 ENCOUNTER — Other Ambulatory Visit: Payer: Self-pay | Admitting: Emergency Medicine

## 2019-05-01 DIAGNOSIS — I10 Essential (primary) hypertension: Secondary | ICD-10-CM

## 2019-05-01 NOTE — Telephone Encounter (Signed)
Requested medication (s) are due for refill today: yes  Requested medication (s) are on the active medication list: yes  Last refill:  01/28/2019  Future visit scheduled: no  Notes to clinic: Patient overdue for office visit    Requested Prescriptions  Pending Prescriptions Disp Refills   losartan (COZAAR) 100 MG tablet [Pharmacy Med Name: LOSARTAN POTASSIUM 100 MG TAB] 90 tablet 1    Sig: TAKE 1 TABLET BY MOUTH EVERY DAY      Cardiovascular:  Angiotensin Receptor Blockers Failed - 05/01/2019  2:30 AM      Failed - Cr in normal range and within 180 days    Creatinine  Date Value Ref Range Status  06/25/2012 0.66 0.60 - 1.30 mg/dL Final   Creatinine, Ser  Date Value Ref Range Status  10/26/2018 0.68 0.61 - 1.24 mg/dL Final          Failed - K in normal range and within 180 days    Potassium  Date Value Ref Range Status  10/26/2018 3.5 3.5 - 5.1 mmol/L Final  06/25/2012 3.8 3.5 - 5.1 mmol/L Final          Failed - Valid encounter within last 6 months    Recent Outpatient Visits           11 months ago Acute non-recurrent maxillary sinusitis   Primary Care at [facility], Ines Bloomer, MD   1 year ago Dermatitis   Primary Care at [place], MD   1 year ago Paronychia of finger of right hand   Primary Care at [place], MD   1 year ago Paronychia of finger of right hand   Primary Care at [place], MD   1 year ago Pain in both hands   Primary Care at [place], [place], [place] - Patient is not pregnant      Passed - Last BP in normal range    BP Readings from Last 1 Encounters:  11/22/18 121/78

## 2019-05-25 ENCOUNTER — Other Ambulatory Visit: Payer: Self-pay | Admitting: Emergency Medicine

## 2019-05-25 DIAGNOSIS — I10 Essential (primary) hypertension: Secondary | ICD-10-CM

## 2019-05-25 NOTE — Telephone Encounter (Signed)
Please get patient scheduled to be seen for refills

## 2019-05-25 NOTE — Telephone Encounter (Signed)
Lvm for patient to call back

## 2019-05-25 NOTE — Telephone Encounter (Signed)
Forwarding medication refill request to the clinical pool for review. 

## 2019-07-30 ENCOUNTER — Encounter
Admission: RE | Admit: 2019-07-30 | Discharge: 2019-07-30 | Disposition: A | Discharge status: Home / Self Care | Payer: Medicare Other | Source: Ambulatory Visit | Attending: Orthopedic Surgery | Admitting: Orthopedic Surgery

## 2019-07-30 ENCOUNTER — Other Ambulatory Visit: Payer: Self-pay

## 2019-07-30 ENCOUNTER — Other Ambulatory Visit: Payer: Self-pay | Admitting: Orthopedic Surgery

## 2019-07-30 HISTORY — DX: Other complications of anesthesia, initial encounter: T88.59XA

## 2019-07-30 NOTE — Patient Instructions (Signed)
Your procedure is scheduled on: August 07, 2019 TUESDAY Report to Day Surgery on the 2nd floor of the [facility]. To find out your arrival time, please call [phone] between 1PM - 3PM on: August 06, 2019 MONDAY  REMEMBER: Instructions that are not followed completely may result in serious medical risk, up to and including death; or upon the discretion of your surgeon and anesthesiologist your surgery may need to be rescheduled.  Do not eat food after midnight the night before surgery.  No gum chewing, lozengers or hard candies.  You may however, drink CLEAR liquids up to 2 hours before you are scheduled to arrive for your surgery. Do not drink anything within 2 hours of the start of your surgery.  Clear liquids include: - water   Do NOT drink anything that is not on this list.  Type 1 and Type 2 diabetics should only drink water.  TAKE THESE MEDICATIONS THE MORNING OF SURGERY WITH A SIP OF WATER: Carvedilol Gabapentin rosuvastatin omeprazole (take one the night before and one on the morning of surgery - helps to prevent nausea after surgery.)  Stop Metformin  2 days prior to surgery. LAST DOSE Saturday August 04, 2019  Follow recommendations from Cardiologist, Pulmonologist or PCP regarding stopping Aspirin, Coumadin, Plavix, Eliquis, Pradaxa, or Pletal. STOP 7 DAYS BEFORE SURGERY  Stop Anti-inflammatories (NSAIDS) such as Advil, Aleve, Ibuprofen, Motrin, Naproxen, Naprosyn and Aspirin based products such as Excedrin, Goodys Powder, BC Powder. STOP 7 DAYS BEFORE SURGERY. (May take Tylenol or Acetaminophen if needed.)  Stop ANY OVER THE COUNTER supplements until after surgery. (May continue Vitamin D, Vitamin B, and multivitamin.)  No Alcohol for 24 hours before or after surgery.  No Smoking including e-cigarettes for 24 hours prior to surgery.  No chewable tobacco products for at least 6 hours prior to surgery.  No nicotine patches on the day of surgery.  On the morning  of surgery brush your teeth with toothpaste and water, you may rinse your mouth with mouthwash if you wish. Do not swallow any toothpaste or mouthwash.  Do not wear jewelry, make-up, hairpins, clips or nail polish.  Do not wear lotions, powders, or perfumes.   Do not shave 48 hours prior to surgery.   Contact lenses, hearing aids and dentures may not be worn into surgery.  Do not bring valuables to the hospital, including drivers license, insurance or credit cards.  [facility] is not responsible for any belongings or valuables.   Use CHG Soap  as directed on instruction sheet.  Notify your doctor if there is any change in your medical condition (cold, fever, infection).  Wear comfortable clothing (specific to your surgery type) to the hospital.  Plan for stool softeners for home use.  If you are being discharged the day of surgery, you will not be allowed to drive home. You will need a responsible adult to drive you home and stay with you that night.   Please call [phone] if you have any questions about these instructions.  Visitation Policy:  Patients undergoing a surgery or procedure in a hospital may have one family member or support person with them as long as that person is not COVID-19 positive or experiencing its symptoms. That person may remain in the waiting area during the procedure. Should the patient need to stay at the hospital during part of their recovery, the support person may visit during visiting hours; 10 am to 8 pm.  Children under 18 years of  age may have both parents or legal guardians with them during their hospital stay.

## 2019-08-03 ENCOUNTER — Encounter
Admission: RE | Admit: 2019-08-03 | Discharge: 2019-08-03 | Disposition: A | Discharge status: Home / Self Care | Payer: Medicare Other | Source: Ambulatory Visit | Attending: Orthopedic Surgery | Admitting: Orthopedic Surgery

## 2019-08-03 ENCOUNTER — Other Ambulatory Visit: Payer: Self-pay

## 2019-08-03 DIAGNOSIS — I10 Essential (primary) hypertension: Secondary | ICD-10-CM

## 2019-08-03 DIAGNOSIS — Z01818 Encounter for other preprocedural examination: Secondary | ICD-10-CM | POA: Insufficient documentation

## 2019-08-03 DIAGNOSIS — Z20822 Contact with and (suspected) exposure to covid-19: Secondary | ICD-10-CM | POA: Insufficient documentation

## 2019-08-03 LAB — CBC WITH DIFFERENTIAL/PLATELET
Abs Immature Granulocytes: 0.02 10*3/uL (ref 0.00–0.07)
Basophils Absolute: 0 10*3/uL (ref 0.0–0.1)
Basophils Relative: 1 %
Eosinophils Absolute: 0.5 10*3/uL (ref 0.0–0.5)
Eosinophils Relative: 7 %
HCT: 47.4 % (ref 39.0–52.0)
Hemoglobin: 14.7 g/dL (ref 13.0–17.0)
Immature Granulocytes: 0 %
Lymphocytes Relative: 21 %
Lymphs Abs: 1.6 10*3/uL (ref 0.7–4.0)
MCH: 27.7 pg (ref 26.0–34.0)
MCHC: 31 g/dL (ref 30.0–36.0)
MCV: 89.3 fL (ref 80.0–100.0)
Monocytes Absolute: 0.7 10*3/uL (ref 0.1–1.0)
Monocytes Relative: 9 %
Neutro Abs: 4.7 10*3/uL (ref 1.7–7.7)
Neutrophils Relative %: 62 %
Platelets: 234 10*3/uL (ref 150–400)
RBC: 5.31 MIL/uL (ref 4.22–5.81)
RDW: 15.7 % — ABNORMAL HIGH (ref 11.5–15.5)
WBC: 7.5 10*3/uL (ref 4.0–10.5)
nRBC: 0 % (ref 0.0–0.2)

## 2019-08-03 LAB — BASIC METABOLIC PANEL
Anion gap: 9 (ref 5–15)
BUN: 30 mg/dL — ABNORMAL HIGH (ref 8–23)
CO2: 29 mmol/L (ref 22–32)
Calcium: 9.3 mg/dL (ref 8.9–10.3)
Chloride: 103 mmol/L (ref 98–111)
Creatinine, Ser: 0.86 mg/dL (ref 0.61–1.24)
GFR calc Af Amer: >60 mL/min (ref >60)
GFR calc non Af Amer: >60 mL/min (ref >60)
Glucose, Bld: 215 mg/dL — ABNORMAL HIGH (ref 70–99)
Potassium: 3.9 mmol/L (ref 3.5–5.1)
Sodium: 141 mmol/L (ref 135–145)

## 2019-08-03 LAB — SARS CORONAVIRUS 2 (TAT 6-24 HRS): SARS Coronavirus 2: NEGATIVE

## 2019-08-03 LAB — PROTIME-INR
INR: 1 (ref 0.8–1.2)
Prothrombin Time: 12.8 seconds (ref 11.4–15.2)

## 2019-08-03 LAB — APTT: aPTT: 31 seconds (ref 24–36)

## 2019-08-07 ENCOUNTER — Encounter: Payer: Self-pay | Admitting: Orthopedic Surgery

## 2019-08-07 ENCOUNTER — Inpatient Hospital Stay
Admission: RE | Admit: 2019-08-07 | Discharge: 2019-08-10 | DRG: 501 | Disposition: A | Discharge status: Skilled Nursing Facility | Payer: Medicare Other | Attending: Orthopedic Surgery | Admitting: Orthopedic Surgery

## 2019-08-07 ENCOUNTER — Ambulatory Visit: Payer: Medicare Other | Admitting: Certified Registered Nurse Anesthetist

## 2019-08-07 ENCOUNTER — Other Ambulatory Visit: Payer: Self-pay

## 2019-08-07 ENCOUNTER — Encounter
Admission: RE | Disposition: A | Discharge status: Skilled Nursing Facility | Payer: Self-pay | Source: Home / Self Care | Attending: Orthopedic Surgery

## 2019-08-07 ENCOUNTER — Ambulatory Visit: Payer: Medicare Other

## 2019-08-07 DIAGNOSIS — Z888 Allergy status to other drugs, medicaments and biological substances status: Secondary | ICD-10-CM

## 2019-08-07 DIAGNOSIS — I10 Essential (primary) hypertension: Secondary | ICD-10-CM | POA: Diagnosis not present

## 2019-08-07 DIAGNOSIS — X58XXXA Exposure to other specified factors, initial encounter: Secondary | ICD-10-CM | POA: Diagnosis present

## 2019-08-07 DIAGNOSIS — E1169 Type 2 diabetes mellitus with other specified complication: Secondary | ICD-10-CM | POA: Diagnosis not present

## 2019-08-07 DIAGNOSIS — Z87891 Personal history of nicotine dependence: Secondary | ICD-10-CM

## 2019-08-07 DIAGNOSIS — Z841 Family history of disorders of kidney and ureter: Secondary | ICD-10-CM

## 2019-08-07 DIAGNOSIS — R0602 Shortness of breath: Secondary | ICD-10-CM

## 2019-08-07 DIAGNOSIS — Z79899 Other long term (current) drug therapy: Secondary | ICD-10-CM

## 2019-08-07 DIAGNOSIS — Z7982 Long term (current) use of aspirin: Secondary | ICD-10-CM

## 2019-08-07 DIAGNOSIS — Z881 Allergy status to other antibiotic agents status: Secondary | ICD-10-CM

## 2019-08-07 DIAGNOSIS — Z961 Presence of intraocular lens: Secondary | ICD-10-CM | POA: Diagnosis present

## 2019-08-07 DIAGNOSIS — Z9842 Cataract extraction status, left eye: Secondary | ICD-10-CM

## 2019-08-07 DIAGNOSIS — R609 Edema, unspecified: Secondary | ICD-10-CM

## 2019-08-07 DIAGNOSIS — Z9889 Other specified postprocedural states: Secondary | ICD-10-CM | POA: Diagnosis not present

## 2019-08-07 DIAGNOSIS — M75111 Incomplete rotator cuff tear or rupture of right shoulder, not specified as traumatic: Secondary | ICD-10-CM | POA: Diagnosis present

## 2019-08-07 DIAGNOSIS — S43431A Superior glenoid labrum lesion of right shoulder, initial encounter: Principal | ICD-10-CM | POA: Diagnosis present

## 2019-08-07 DIAGNOSIS — E785 Hyperlipidemia, unspecified: Secondary | ICD-10-CM | POA: Diagnosis not present

## 2019-08-07 DIAGNOSIS — Z20822 Contact with and (suspected) exposure to covid-19: Secondary | ICD-10-CM | POA: Diagnosis present

## 2019-08-07 DIAGNOSIS — Z794 Long term (current) use of insulin: Secondary | ICD-10-CM

## 2019-08-07 DIAGNOSIS — M7541 Impingement syndrome of right shoulder: Secondary | ICD-10-CM | POA: Diagnosis present

## 2019-08-07 DIAGNOSIS — Z419 Encounter for procedure for purposes other than remedying health state, unspecified: Secondary | ICD-10-CM

## 2019-08-07 DIAGNOSIS — F329 Major depressive disorder, single episode, unspecified: Secondary | ICD-10-CM | POA: Diagnosis present

## 2019-08-07 DIAGNOSIS — Z96653 Presence of artificial knee joint, bilateral: Secondary | ICD-10-CM | POA: Diagnosis present

## 2019-08-07 DIAGNOSIS — Z79890 Hormone replacement therapy (postmenopausal): Secondary | ICD-10-CM

## 2019-08-07 DIAGNOSIS — R0603 Acute respiratory distress: Secondary | ICD-10-CM | POA: Diagnosis not present

## 2019-08-07 DIAGNOSIS — Z8781 Personal history of (healed) traumatic fracture: Secondary | ICD-10-CM

## 2019-08-07 DIAGNOSIS — Z9841 Cataract extraction status, right eye: Secondary | ICD-10-CM

## 2019-08-07 DIAGNOSIS — R0902 Hypoxemia: Secondary | ICD-10-CM | POA: Diagnosis present

## 2019-08-07 DIAGNOSIS — E669 Obesity, unspecified: Secondary | ICD-10-CM | POA: Diagnosis present

## 2019-08-07 DIAGNOSIS — Z880 Allergy status to penicillin: Secondary | ICD-10-CM

## 2019-08-07 DIAGNOSIS — J9811 Atelectasis: Secondary | ICD-10-CM | POA: Diagnosis not present

## 2019-08-07 DIAGNOSIS — M19011 Primary osteoarthritis, right shoulder: Secondary | ICD-10-CM | POA: Diagnosis present

## 2019-08-07 DIAGNOSIS — E1165 Type 2 diabetes mellitus with hyperglycemia: Secondary | ICD-10-CM | POA: Diagnosis not present

## 2019-08-07 DIAGNOSIS — G4730 Sleep apnea, unspecified: Secondary | ICD-10-CM | POA: Diagnosis present

## 2019-08-07 DIAGNOSIS — Z8041 Family history of malignant neoplasm of ovary: Secondary | ICD-10-CM

## 2019-08-07 DIAGNOSIS — Z6838 Body mass index (BMI) 38.0-38.9, adult: Secondary | ICD-10-CM

## 2019-08-07 DIAGNOSIS — K219 Gastro-esophageal reflux disease without esophagitis: Secondary | ICD-10-CM | POA: Diagnosis present

## 2019-08-07 DIAGNOSIS — Z882 Allergy status to sulfonamides status: Secondary | ICD-10-CM

## 2019-08-07 HISTORY — PX: SHOULDER OPEN ROTATOR CUFF REPAIR: SHX2407

## 2019-08-07 LAB — HEMOGLOBIN A1C
Hgb A1c MFr Bld: 6.8 % — ABNORMAL HIGH (ref 4.8–5.6)
Mean Plasma Glucose: 148.46 mg/dL

## 2019-08-07 LAB — GLUCOSE, CAPILLARY
Glucose-Capillary: 206 mg/dL — ABNORMAL HIGH (ref 70–99)
Glucose-Capillary: 182 mg/dL — ABNORMAL HIGH (ref 70–99)
Glucose-Capillary: 330 mg/dL — ABNORMAL HIGH (ref 70–99)
Glucose-Capillary: 250 mg/dL — ABNORMAL HIGH (ref 70–99)
Glucose-Capillary: 310 mg/dL — ABNORMAL HIGH (ref 70–99)

## 2019-08-07 SURGERY — SHOULDER ARTHROSCOPY WITH SUBACROMIAL DECOMPRESSION AND DISTAL CLAVICLE EXCISION
Anesthesia: General | Site: Shoulder | Laterality: Right

## 2019-08-07 MED ORDER — INSULIN GLARGINE 100 UNIT/ML ~~LOC~~ SOLN
80.0000 [IU] | Freq: Every day | SUBCUTANEOUS | Status: DC
  Administered 2019-08-07: 80 [IU] via SUBCUTANEOUS
  Filled 2019-08-07 (×2): qty 0.8

## 2019-08-07 MED ORDER — FENTANYL CITRATE (PF) 100 MCG/2ML IJ SOLN
INTRAMUSCULAR | Status: AC
  Administered 2019-08-07: 50 ug via INTRAVENOUS
  Filled 2019-08-07: qty 2

## 2019-08-07 MED ORDER — INSULIN ASPART 100 UNIT/ML ~~LOC~~ SOLN
SUBCUTANEOUS | Status: AC
  Administered 2019-08-07: 10 [IU] via SUBCUTANEOUS
  Filled 2019-08-07: qty 1

## 2019-08-07 MED ORDER — LIDOCAINE HCL (PF) 1 % IJ SOLN
INTRAMUSCULAR | Status: AC
  Filled 2019-08-07: qty 5

## 2019-08-07 MED ORDER — ALUM & MAG HYDROXIDE-SIMETH 200-200-20 MG/5ML PO SUSP: 30.0000 mL | ORAL | Status: DC | PRN

## 2019-08-07 MED ORDER — BUPIVACAINE HCL (PF) 0.25 % IJ SOLN
INTRAMUSCULAR | Status: AC
  Filled 2019-08-07: qty 30

## 2019-08-07 MED ORDER — INSULIN ASPART 100 UNIT/ML ~~LOC~~ SOLN: 10.0000 [IU] | Freq: Once | SUBCUTANEOUS | Status: DC

## 2019-08-07 MED ORDER — ONDANSETRON HCL 4 MG/2ML IJ SOLN: 4.0000 mg | Freq: Four times a day (QID) | INTRAMUSCULAR | Status: DC | PRN

## 2019-08-07 MED ORDER — BUPIVACAINE HCL (PF) 0.5 % IJ SOLN
INTRAMUSCULAR | Status: AC
  Filled 2019-08-07: qty 10

## 2019-08-07 MED ORDER — LIDOCAINE HCL (CARDIAC) PF 100 MG/5ML IV SOSY
PREFILLED_SYRINGE | INTRAVENOUS | Status: DC | PRN
  Administered 2019-08-07: 100 mg via INTRAVENOUS

## 2019-08-07 MED ORDER — HYDROCODONE-ACETAMINOPHEN 7.5-325 MG PO TABS
1.0000 | ORAL_TABLET | ORAL | Status: DC | PRN
  Administered 2019-08-08: 10:00:00 2 via ORAL
  Filled 2019-08-07 (×2): qty 2

## 2019-08-07 MED ORDER — ONETOUCH LANCETS MISC: 1.0000 | Freq: Four times a day (QID) | Status: DC

## 2019-08-07 MED ORDER — DULOXETINE HCL 30 MG PO CPEP
30.0000 mg | ORAL_CAPSULE | Freq: Every day | ORAL | Status: DC
  Administered 2019-08-08 – 2019-08-10 (×3): 30 mg via ORAL
  Filled 2019-08-07 (×4): qty 1

## 2019-08-07 MED ORDER — PROPOFOL 500 MG/50ML IV EMUL
INTRAVENOUS | Status: AC
  Filled 2019-08-07: qty 50

## 2019-08-07 MED ORDER — ASPIRIN EC 81 MG PO TBEC
81.0000 mg | DELAYED_RELEASE_TABLET | Freq: Every day | ORAL | Status: DC
  Administered 2019-08-07 – 2019-08-10 (×4): 81 mg via ORAL
  Filled 2019-08-07 (×4): qty 1

## 2019-08-07 MED ORDER — LIDOCAINE HCL (PF) 2 % IJ SOLN
INTRAMUSCULAR | Status: AC
  Filled 2019-08-07: qty 10

## 2019-08-07 MED ORDER — MIDAZOLAM HCL 2 MG/2ML IJ SOLN
INTRAMUSCULAR | Status: AC
  Administered 2019-08-07: 07:00:00 1 mg via INTRAVENOUS
  Filled 2019-08-07: qty 2

## 2019-08-07 MED ORDER — ACETAMINOPHEN 500 MG PO TABS
500.0000 mg | ORAL_TABLET | Freq: Four times a day (QID) | ORAL | Status: AC
  Administered 2019-08-08 (×2): 500 mg via ORAL
  Filled 2019-08-07 (×2): qty 1

## 2019-08-07 MED ORDER — ONDANSETRON HCL 4 MG/2ML IJ SOLN
INTRAMUSCULAR | Status: DC | PRN
  Administered 2019-08-07: 4 mg via INTRAVENOUS

## 2019-08-07 MED ORDER — CARVEDILOL 3.125 MG PO TABS
6.2500 mg | ORAL_TABLET | Freq: Two times a day (BID) | ORAL | Status: DC
  Administered 2019-08-07 – 2019-08-10 (×6): 6.25 mg via ORAL
  Filled 2019-08-07 (×6): qty 2

## 2019-08-07 MED ORDER — CLINDAMYCIN PHOSPHATE 900 MG/50ML IV SOLN
INTRAVENOUS | Status: AC
  Filled 2019-08-07: qty 50

## 2019-08-07 MED ORDER — BUPIVACAINE HCL (PF) 0.5 % IJ SOLN
INTRAMUSCULAR | Status: DC | PRN
  Administered 2019-08-07: 10 mL via PERINEURAL

## 2019-08-07 MED ORDER — MORPHINE SULFATE (PF) 2 MG/ML IV SOLN: 0.5000 mg | INTRAVENOUS | Status: DC | PRN

## 2019-08-07 MED ORDER — DOCUSATE SODIUM 100 MG PO CAPS
100.0000 mg | ORAL_CAPSULE | Freq: Two times a day (BID) | ORAL | Status: DC
  Administered 2019-08-07 – 2019-08-10 (×6): 100 mg via ORAL
  Filled 2019-08-07 (×7): qty 1

## 2019-08-07 MED ORDER — SODIUM CHLORIDE 0.9 % IV SOLN
75.0000 mL/h | INTRAVENOUS | Status: DC
  Administered 2019-08-07 – 2019-08-08 (×2): 75 mL/h via INTRAVENOUS

## 2019-08-07 MED ORDER — BUPROPION HCL ER (XL) 150 MG PO TB24: 150.0000 mg | ORAL_TABLET | Freq: Every day | ORAL | Status: DC

## 2019-08-07 MED ORDER — ROCURONIUM BROMIDE 10 MG/ML (PF) SYRINGE
PREFILLED_SYRINGE | INTRAVENOUS | Status: AC
  Filled 2019-08-07: qty 10

## 2019-08-07 MED ORDER — OXYCODONE HCL 5 MG PO TABS: 5.0000 mg | ORAL_TABLET | ORAL | 0 refills | Status: DC | PRN

## 2019-08-07 MED ORDER — FENTANYL CITRATE (PF) 100 MCG/2ML IJ SOLN: 50.0000 ug | INTRAMUSCULAR | Status: DC | PRN

## 2019-08-07 MED ORDER — SENNA 8.6 MG PO TABS
1.0000 | ORAL_TABLET | Freq: Two times a day (BID) | ORAL | Status: DC
  Administered 2019-08-07 – 2019-08-10 (×6): 8.6 mg via ORAL
  Filled 2019-08-07 (×6): qty 1

## 2019-08-07 MED ORDER — SUGAMMADEX SODIUM 500 MG/5ML IV SOLN
INTRAVENOUS | Status: AC
  Filled 2019-08-07: qty 5

## 2019-08-07 MED ORDER — CLINDAMYCIN PHOSPHATE 900 MG/50ML IV SOLN
900.0000 mg | INTRAVENOUS | Status: AC
  Administered 2019-08-07: 900 mg via INTRAVENOUS

## 2019-08-07 MED ORDER — HYDROCODONE-ACETAMINOPHEN 5-325 MG PO TABS
1.0000 | ORAL_TABLET | ORAL | Status: DC | PRN
  Administered 2019-08-08 – 2019-08-10 (×2): 1 via ORAL
  Filled 2019-08-07 (×2): qty 1

## 2019-08-07 MED ORDER — MIDAZOLAM HCL 2 MG/2ML IJ SOLN
INTRAMUSCULAR | Status: AC
  Filled 2019-08-07: qty 2

## 2019-08-07 MED ORDER — EMPAGLIFLOZIN-METFORMIN HCL 5-1000 MG PO TABS: 1.0000 | ORAL_TABLET | Freq: Two times a day (BID) | ORAL | Status: DC

## 2019-08-07 MED ORDER — MAGNESIUM CITRATE PO SOLN
1.0000 | Freq: Once | ORAL | Status: DC | PRN
  Filled 2019-08-07: qty 296

## 2019-08-07 MED ORDER — TRAMADOL HCL 50 MG PO TABS
50.0000 mg | ORAL_TABLET | Freq: Four times a day (QID) | ORAL | Status: DC
  Administered 2019-08-08 – 2019-08-10 (×8): 50 mg via ORAL
  Filled 2019-08-07 (×9): qty 1

## 2019-08-07 MED ORDER — INSULIN ASPART 100 UNIT/ML ~~LOC~~ SOLN: 10.0000 [IU] | Freq: Once | SUBCUTANEOUS | Status: AC

## 2019-08-07 MED ORDER — PROPOFOL 10 MG/ML IV BOLUS
INTRAVENOUS | Status: AC
  Filled 2019-08-07: qty 20

## 2019-08-07 MED ORDER — DEXAMETHASONE SODIUM PHOSPHATE 10 MG/ML IJ SOLN
INTRAMUSCULAR | Status: DC | PRN
  Administered 2019-08-07: 10 mg via INTRAVENOUS

## 2019-08-07 MED ORDER — INSULIN PEN NEEDLE 32G X 4 MM MISC: 1.0000 | Freq: Three times a day (TID) | Status: DC

## 2019-08-07 MED ORDER — POLYETHYLENE GLYCOL 3350 17 G PO PACK: 17.0000 g | PACK | Freq: Every day | ORAL | Status: DC | PRN

## 2019-08-07 MED ORDER — ACETAMINOPHEN 10 MG/ML IV SOLN
INTRAVENOUS | Status: AC
  Filled 2019-08-07: qty 100

## 2019-08-07 MED ORDER — FENTANYL CITRATE (PF) 100 MCG/2ML IJ SOLN
INTRAMUSCULAR | Status: DC | PRN
  Administered 2019-08-07 (×2): 50 ug via INTRAVENOUS

## 2019-08-07 MED ORDER — FENTANYL CITRATE (PF) 100 MCG/2ML IJ SOLN
INTRAMUSCULAR | Status: AC
  Filled 2019-08-07: qty 2

## 2019-08-07 MED ORDER — DEXAMETHASONE SODIUM PHOSPHATE 10 MG/ML IJ SOLN
INTRAMUSCULAR | Status: AC
  Filled 2019-08-07: qty 1

## 2019-08-07 MED ORDER — METFORMIN HCL 500 MG PO TABS
1000.0000 mg | ORAL_TABLET | Freq: Two times a day (BID) | ORAL | Status: DC
  Administered 2019-08-08 – 2019-08-10 (×5): 1000 mg via ORAL
  Filled 2019-08-07 (×5): qty 2

## 2019-08-07 MED ORDER — INSULIN ASPART 100 UNIT/ML ~~LOC~~ SOLN
0.0000 [IU] | Freq: Three times a day (TID) | SUBCUTANEOUS | Status: DC
  Administered 2019-08-08 (×2): 3 [IU] via SUBCUTANEOUS
  Administered 2019-08-08: 09:00:00 5 [IU] via SUBCUTANEOUS
  Administered 2019-08-09: 2 [IU] via SUBCUTANEOUS
  Administered 2019-08-09 – 2019-08-10 (×2): 3 [IU] via SUBCUTANEOUS
  Filled 2019-08-07 (×6): qty 1

## 2019-08-07 MED ORDER — AMLODIPINE BESYLATE 5 MG PO TABS
5.0000 mg | ORAL_TABLET | Freq: Every day | ORAL | Status: DC
  Administered 2019-08-07 – 2019-08-10 (×4): 5 mg via ORAL
  Filled 2019-08-07 (×4): qty 1

## 2019-08-07 MED ORDER — SODIUM CHLORIDE 0.9 % IV SOLN
INTRAVENOUS | Status: DC | PRN
  Administered 2019-08-07: 50 ug/min via INTRAVENOUS

## 2019-08-07 MED ORDER — HYDRALAZINE HCL 20 MG/ML IJ SOLN
10.0000 mg | Freq: Four times a day (QID) | INTRAMUSCULAR | Status: DC | PRN
  Filled 2019-08-07: qty 0.5

## 2019-08-07 MED ORDER — ACETAMINOPHEN 10 MG/ML IV SOLN
INTRAVENOUS | Status: DC | PRN
  Administered 2019-08-07: 1000 mg via INTRAVENOUS

## 2019-08-07 MED ORDER — GABAPENTIN 300 MG PO CAPS
900.0000 mg | ORAL_CAPSULE | Freq: Three times a day (TID) | ORAL | Status: DC
  Administered 2019-08-07 – 2019-08-10 (×8): 900 mg via ORAL
  Filled 2019-08-07 (×10): qty 3

## 2019-08-07 MED ORDER — ROCURONIUM BROMIDE 100 MG/10ML IV SOLN
INTRAVENOUS | Status: DC | PRN
  Administered 2019-08-07: 20 mg via INTRAVENOUS
  Administered 2019-08-07: 10 mg via INTRAVENOUS
  Administered 2019-08-07: 70 mg via INTRAVENOUS

## 2019-08-07 MED ORDER — EPINEPHRINE PF 1 MG/ML IJ SOLN
INTRAMUSCULAR | Status: AC
  Filled 2019-08-07: qty 4

## 2019-08-07 MED ORDER — METHOCARBAMOL 500 MG PO TABS: 500.0000 mg | ORAL_TABLET | Freq: Four times a day (QID) | ORAL | Status: DC | PRN

## 2019-08-07 MED ORDER — FENTANYL CITRATE (PF) 100 MCG/2ML IJ SOLN: 25.0000 ug | INTRAMUSCULAR | Status: DC | PRN

## 2019-08-07 MED ORDER — CHLORHEXIDINE GLUCONATE CLOTH 2 % EX PADS
6.0000 | MEDICATED_PAD | Freq: Once | CUTANEOUS | Status: AC
  Administered 2019-08-07: 07:00:00 6 via TOPICAL

## 2019-08-07 MED ORDER — BUPIVACAINE LIPOSOME 1.3 % IJ SUSP
INTRAMUSCULAR | Status: DC | PRN
  Administered 2019-08-07: 20 mL via PERINEURAL

## 2019-08-07 MED ORDER — BUPIVACAINE LIPOSOME 1.3 % IJ SUSP
INTRAMUSCULAR | Status: AC
  Filled 2019-08-07: qty 20

## 2019-08-07 MED ORDER — LIDOCAINE HCL (PF) 1 % IJ SOLN
INTRAMUSCULAR | Status: DC | PRN
  Administered 2019-08-07: 3 mL

## 2019-08-07 MED ORDER — METHOCARBAMOL 1000 MG/10ML IJ SOLN
500.0000 mg | Freq: Four times a day (QID) | INTRAVENOUS | Status: DC | PRN
  Filled 2019-08-07: qty 5

## 2019-08-07 MED ORDER — ADULT MULTIVITAMIN W/MINERALS CH
1.0000 | ORAL_TABLET | Freq: Every day | ORAL | Status: DC
  Administered 2019-08-08 – 2019-08-10 (×3): 1 via ORAL
  Filled 2019-08-07 (×3): qty 1

## 2019-08-07 MED ORDER — PROPOFOL 10 MG/ML IV BOLUS
INTRAVENOUS | Status: DC | PRN
  Administered 2019-08-07: 20 mg via INTRAVENOUS
  Administered 2019-08-07: 200 mg via INTRAVENOUS

## 2019-08-07 MED ORDER — MELATONIN 5 MG PO TABS: 10.0000 mg | ORAL_TABLET | Freq: Every evening | ORAL | Status: DC | PRN

## 2019-08-07 MED ORDER — GLYCOPYRROLATE 0.2 MG/ML IJ SOLN
INTRAMUSCULAR | Status: AC
  Filled 2019-08-07: qty 1

## 2019-08-07 MED ORDER — MIDAZOLAM HCL 2 MG/2ML IJ SOLN: 1.0000 mg | INTRAMUSCULAR | Status: DC | PRN

## 2019-08-07 MED ORDER — PHENYLEPHRINE HCL (PRESSORS) 10 MG/ML IV SOLN
INTRAVENOUS | Status: DC | PRN
  Administered 2019-08-07: 100 ug via INTRAVENOUS

## 2019-08-07 MED ORDER — CLINDAMYCIN PHOSPHATE 600 MG/50ML IV SOLN
600.0000 mg | Freq: Four times a day (QID) | INTRAVENOUS | Status: AC
  Administered 2019-08-07 – 2019-08-08 (×2): 600 mg via INTRAVENOUS
  Filled 2019-08-07 (×2): qty 50

## 2019-08-07 MED ORDER — PROPOFOL 500 MG/50ML IV EMUL
INTRAVENOUS | Status: AC
  Filled 2019-08-07: qty 100

## 2019-08-07 MED ORDER — ROSUVASTATIN CALCIUM 10 MG PO TABS
20.0000 mg | ORAL_TABLET | Freq: Every day | ORAL | Status: DC
  Administered 2019-08-08 – 2019-08-10 (×3): 20 mg via ORAL
  Filled 2019-08-07 (×3): qty 2

## 2019-08-07 MED ORDER — ONETOUCH ULTRA SYSTEM W/DEVICE KIT: 1.0000 | PACK | Freq: Once | Status: DC

## 2019-08-07 MED ORDER — LOSARTAN POTASSIUM 50 MG PO TABS
100.0000 mg | ORAL_TABLET | Freq: Every day | ORAL | Status: DC
  Administered 2019-08-08 – 2019-08-10 (×3): 100 mg via ORAL
  Filled 2019-08-07 (×3): qty 2

## 2019-08-07 MED ORDER — INSULIN ASPART 100 UNIT/ML ~~LOC~~ SOLN
0.0000 [IU] | Freq: Every day | SUBCUTANEOUS | Status: DC
  Administered 2019-08-07: 4 [IU] via SUBCUTANEOUS
  Administered 2019-08-08: 2 [IU] via SUBCUTANEOUS
  Filled 2019-08-07 (×2): qty 1

## 2019-08-07 MED ORDER — ONDANSETRON HCL 4 MG/2ML IJ SOLN
INTRAMUSCULAR | Status: AC
  Filled 2019-08-07: qty 2

## 2019-08-07 MED ORDER — ACETAMINOPHEN 325 MG PO TABS
325.0000 mg | ORAL_TABLET | Freq: Four times a day (QID) | ORAL | Status: DC | PRN
  Administered 2019-08-09: 650 mg via ORAL
  Filled 2019-08-07: qty 2

## 2019-08-07 MED ORDER — ENOXAPARIN SODIUM 40 MG/0.4ML ~~LOC~~ SOLN
40.0000 mg | SUBCUTANEOUS | Status: DC
  Administered 2019-08-08 – 2019-08-10 (×3): 40 mg via SUBCUTANEOUS
  Filled 2019-08-07 (×3): qty 0.4

## 2019-08-07 MED ORDER — PHENOL 1.4 % MT LIQD
1.0000 | OROMUCOSAL | Status: DC | PRN
  Filled 2019-08-07: qty 177

## 2019-08-07 MED ORDER — SUGAMMADEX SODIUM 200 MG/2ML IV SOLN
INTRAVENOUS | Status: DC | PRN
  Administered 2019-08-07: 300 mg via INTRAVENOUS

## 2019-08-07 MED ORDER — ONDANSETRON HCL 4 MG PO TABS: 4.0000 mg | ORAL_TABLET | Freq: Three times a day (TID) | ORAL | 0 refills | Status: DC | PRN

## 2019-08-07 MED ORDER — PROPOFOL 500 MG/50ML IV EMUL
INTRAVENOUS | Status: DC | PRN
  Administered 2019-08-07: 125 ug/kg/min via INTRAVENOUS
  Administered 2019-08-07: 100 ug/kg/min via INTRAVENOUS

## 2019-08-07 MED ORDER — BISACODYL 10 MG RE SUPP: 10.0000 mg | Freq: Every day | RECTAL | Status: DC | PRN

## 2019-08-07 MED ORDER — SODIUM CHLORIDE 0.9 % IV SOLN: INTRAVENOUS | Status: DC

## 2019-08-07 MED ORDER — GLUCOSE BLOOD VI STRP: 1.0000 | ORAL_STRIP | Freq: Four times a day (QID) | Status: DC

## 2019-08-07 MED ORDER — MENTHOL 3 MG MT LOZG
1.0000 | LOZENGE | OROMUCOSAL | Status: DC | PRN
  Filled 2019-08-07: qty 9

## 2019-08-07 MED ORDER — ONDANSETRON HCL 4 MG PO TABS: 4.0000 mg | ORAL_TABLET | Freq: Four times a day (QID) | ORAL | Status: DC | PRN

## 2019-08-07 MED ORDER — CHLORHEXIDINE GLUCONATE CLOTH 2 % EX PADS: 6.0000 | MEDICATED_PAD | Freq: Once | CUTANEOUS | Status: DC

## 2019-08-07 MED ORDER — ONDANSETRON HCL 4 MG/2ML IJ SOLN: 4.0000 mg | Freq: Once | INTRAMUSCULAR | Status: DC | PRN

## 2019-08-07 MED ORDER — LIDOCAINE HCL (PF) 1 % IJ SOLN
INTRAMUSCULAR | Status: AC
  Filled 2019-08-07: qty 30

## 2019-08-07 MED ORDER — PANTOPRAZOLE SODIUM 40 MG PO TBEC
40.0000 mg | DELAYED_RELEASE_TABLET | Freq: Every day | ORAL | Status: DC
  Administered 2019-08-08 – 2019-08-10 (×3): 40 mg via ORAL
  Filled 2019-08-07 (×3): qty 1

## 2019-08-07 MED ORDER — SEMAGLUTIDE (1 MG/DOSE) 2 MG/1.5ML ~~LOC~~ SOPN: 1.0000 mg | PEN_INJECTOR | SUBCUTANEOUS | Status: DC

## 2019-08-07 SURGICAL SUPPLY — 64 items
ADAPTER IRRIG TUBE 2 SPIKE SOL (ADAPTER) ×6 IMPLANT
ANCHOR ALL-SUT Q-FIX 2.8 (Anchor) ×2 IMPLANT
ANCHOR SUT 5.5 MULTIFIX (Orthopedic Implant) ×1 IMPLANT
BUR RADIUS 4.0X18.5 (BURR) ×3 IMPLANT
BUR RADIUS 5.5 (BURR) ×3 IMPLANT
CANNULA 5.75X7 CRYSTAL CLEAR (CANNULA) ×6 IMPLANT
CANNULA PARTIAL THREAD 2X7 (CANNULA) ×3 IMPLANT
CANNULA TWIST IN 8.25X9CM (CANNULA) IMPLANT
CONNECTOR PERFECT PASSER (CONNECTOR) ×1 IMPLANT
COOLER POLAR GLACIER W/PUMP (MISCELLANEOUS) ×3 IMPLANT
COVER WAND RF STERILE (DRAPES) ×3 IMPLANT
CRADLE LAMINECT ARM (MISCELLANEOUS) ×6 IMPLANT
DEVICE SUCT BLK HOLE OR FLOOR (MISCELLANEOUS) ×1 IMPLANT
DRAPE 3/4 80X56 (DRAPES) ×3 IMPLANT
DRAPE IMP U-DRAPE 54X76 (DRAPES) ×6 IMPLANT
DRAPE INCISE IOBAN 66X45 STRL (DRAPES) ×3 IMPLANT
DRAPE SPLIT 6X30 W/TAPE (DRAPES) ×6 IMPLANT
DRAPE U-SHAPE 47X51 STRL (DRAPES) IMPLANT
DURAPREP 26ML APPLICATOR (WOUND CARE) ×9 IMPLANT
ELECT REM PT RETURN 9FT ADLT (ELECTROSURGICAL) ×3
ELECTRODE REM PT RTRN 9FT ADLT (ELECTROSURGICAL) ×2 IMPLANT
GAUZE SPONGE 4X4 12PLY STRL (GAUZE/BANDAGES/DRESSINGS) ×3 IMPLANT
GAUZE XEROFORM 1X8 LF (GAUZE/BANDAGES/DRESSINGS) ×3 IMPLANT
GLOVE BIOGEL PI IND STRL 9 (GLOVE) ×2 IMPLANT
GLOVE BIOGEL PI INDICATOR 9 (GLOVE) ×1
GLOVE SURG 9.0 ORTHO LTXF (GLOVE) ×6 IMPLANT
GOWN STRL REUS TWL 2XL XL LVL4 (GOWN DISPOSABLE) ×3 IMPLANT
IV LACTATED RINGER IRRG 3000ML (IV SOLUTION) ×16
IV LR IRRIG 3000ML ARTHROMATIC (IV SOLUTION) ×12 IMPLANT
KIT INSERTION 2.9 PUSHLOCK (KITS) ×3 IMPLANT
KIT STABILIZATION SHOULDER (MISCELLANEOUS) ×3 IMPLANT
KIT SUTURE 2.8 Q-FIX DISP (MISCELLANEOUS) ×1 IMPLANT
KIT TURNOVER KIT A (KITS) ×3 IMPLANT
MANIFOLD NEPTUNE II (INSTRUMENTS) ×3 IMPLANT
MASK FACE SPIDER DISP (MASK) ×3 IMPLANT
MAT ABSORB  FLUID 56X50 GRAY (MISCELLANEOUS) ×2
MAT ABSORB FLUID 56X50 GRAY (MISCELLANEOUS) ×4 IMPLANT
NEEDLE HYPO 22GX1.5 SAFETY (NEEDLE) ×3 IMPLANT
PACK ARTHROSCOPY SHOULDER (MISCELLANEOUS) ×3 IMPLANT
PAD WRAPON POLAR SHDR XLG (MISCELLANEOUS) ×2 IMPLANT
PASSER SUT FIRSTPASS SELF (INSTRUMENTS) ×1 IMPLANT
SET TUBE SUCT SHAVER OUTFL 24K (TUBING) ×3 IMPLANT
SET TUBE TIP INTRA-ARTICULAR (MISCELLANEOUS) ×3 IMPLANT
SLING ULTRA II M (MISCELLANEOUS) ×1 IMPLANT
STRAP SAFETY 5IN WIDE (MISCELLANEOUS) ×3 IMPLANT
STRIP CLOSURE SKIN 1/2X4 (GAUZE/BANDAGES/DRESSINGS) ×3 IMPLANT
SUT ETHILON 4-0 (SUTURE) ×1
SUT ETHILON 4-0 FS2 18XMFL BLK (SUTURE) ×2
SUT MNCRL 4-0 (SUTURE) ×1
SUT MNCRL 4-0 27XMFL (SUTURE) ×2
SUT PDS AB 0 CT1 27 (SUTURE) IMPLANT
SUT PERFECTPASSER WHITE CART (SUTURE) ×2 IMPLANT
SUT SMART STITCH CARTRIDGE (SUTURE) ×1 IMPLANT
SUT VIC AB 0 CT1 36 (SUTURE) IMPLANT
SUT VIC AB 2-0 CT2 27 (SUTURE) IMPLANT
SUTURE ETHLN 4-0 FS2 18XMF BLK (SUTURE) ×2 IMPLANT
SUTURE MNCRL 4-0 27XMF (SUTURE) ×2 IMPLANT
SUTURE TAPE FIBERLINK 1.3 LOOP (SUTURE) IMPLANT
SUTURETAPE FIBERLINK 1.3 LOOP (SUTURE)
TAPE MICROFOAM 4IN (TAPE) ×3 IMPLANT
TUBING ARTHRO INFLOW-ONLY STRL (TUBING) ×3 IMPLANT
TUBING CONNECTING 10 (TUBING) ×3 IMPLANT
WAND HAND CNTRL MULTIVAC 90 (MISCELLANEOUS) ×3 IMPLANT
WRAPON POLAR PAD SHDR XLG (MISCELLANEOUS) ×3

## 2019-08-07 NOTE — Progress Notes (Addendum)
Pt to PACU s/p shoulder arthroscopy with ISNB. Pt oxygen 92-94% 6L simple mask. Pt sitting up in bed. Deep breathing encouraged, incentive spirometry teaching given. Oxygen 85-90% on room air and 89-92% on 3L. Lungs clear.  Notified Dr. Priscella Mann who advises to continue to encourage incentive spirometry, cough and deep breathe. Will continue to assess.

## 2019-08-07 NOTE — TOC Progression Note (Signed)
Transition of Care [facility]) - Progression Note    Patient Details  Name: Nathan Dean MRN: [id number] Date of Birth: 1956/05/11  Transition of [facility] CM/SW Contact  Barrie Dunker, RN Phone Number: 08/07/2019, 11:49 AM  Clinical Narrative:     RNCM set up with patient with [facility] for HH PT. And aide,  A platform walker will be delivered to the home by [employer].  I notified Brad with [employer].       Expected Discharge Plan and Services           Expected Discharge Date: 08/07/19                                     Social Determinants of Health (SDOH) Interventions    Readmission Risk Interventions Readmission Risk Prevention Plan 10/21/2018  Post Dischage Appt Complete  Medication Screening Complete  Transportation Screening Complete  Some recent data might be hidden

## 2019-08-07 NOTE — Progress Notes (Signed)
Wife back to see patient, patient moved around to Post op to await bed, wife with patient also.  Patient happy and thankful of all the staff.

## 2019-08-07 NOTE — H&P (Signed)
PREOPERATIVE H&P  Chief Complaint: Right shoulder SLAP tear, subacromial Impingement and acromioclavicular joint arthrosis  HPI: Nathan Dean is a 64 y.o. male who presents for preoperative history and physical with a diagnosis of right shoulder SLAP tear, subacromial Impingement and acromioclavicular joint arthrosis.  Symptoms of pain, limited ROM and weakness of the right shoulder are significantly impairing activities of daily living.  MRI of the right shoulder has demonstrated a SLAP tear without rotator cuff tear.  Patient has failed nonoperative management including subacromial injection.  He wished to proceed with shoulder arthroscopy with biceps tenotomy versus tenodesis, subacromial decompression and distal clavicle excision.   Past Medical History:  Diagnosis Date  . 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 Diabetes ([place])   . GERD (gastroesophageal reflux disease)   . Hyperlipidemia   . Hypertension   . Sleep apnea    Past Surgical History:  Procedure Laterality Date  . BACK SURGERY     in the 80's -herniated disc  . BACK SURGERY     L4 bone spur 1990  . CARPAL TUNNEL RELEASE Right 12/28/2016   Procedure: RIGHT ULNAR AND MEDIAN NEUROPLASTY AT WRIST;  Surgeon: Milly Jakob, MD;  Location: [facility];  Service: Orthopedics;  Laterality: Right;  . CARPAL TUNNEL RELEASE Left    25 years ago  . CATARACT EXTRACTION W/ INTRAOCULAR LENS IMPLANT Bilateral   . 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 TONSILLECTOMY     Social History   Socioeconomic History  . Marital status: Married    Spouse name: Not on file  . Number of children: 0  . Years of education: Not on file  . Highest education level: Associate degree: academic program  Occupational History  . Occupation: Currently Working   Tobacco Use  . Smoking status: Former Smoker    Packs/day: 2.00    Years: 20.00    Pack years: 40.00    Types:  Cigarettes    Quit date: 06/28/1996    Years since quitting: 23.1  . Smokeless tobacco: Never Used  Substance and Sexual Activity  . Alcohol use: Not Currently    Alcohol/week: 0.0 standard drinks  . Drug use: No  . Sexual activity: Not on file  Other Topics Concern  . Not on file  Social History Narrative   Right handed    Lives at home spouse    Caffeine 5 -6 cups     Social Determinants of Health   Financial Resource Strain:   . Difficulty of Paying Living Expenses:   Food Insecurity:   . Worried About Charity fundraiser in the Last Year:   . Arboriculturist in the Last Year:   Transportation Needs:   . Film/video editor (Medical):   Marland Kitchen Lack of Transportation (Non-Medical):   Physical Activity:   . Days of Exercise per Week:   . Minutes of Exercise per Session:   Stress:   . Feeling of Stress :   Social Connections:   . Frequency of Communication with Friends and Family:   . Frequency of Social Gatherings with Friends and Family:   . Attends Religious Services:   . Active Member of Clubs or Organizations:   . Attends Archivist Meetings:   Marland Kitchen Marital Status:    Family History  Problem Relation Age of Onset  . Ovarian cancer Mother   . Kidney failure Father   .  Cancer Brother    Allergies  Allergen Reactions  . Penicillins Anaphylaxis, Rash and Other (See Comments)  . Succinylcholine Chloride Anaphylaxis and Rash  . Keflex [Cephalexin] Rash  . Sulfa Antibiotics Rash and Other (See Comments)   Prior to Admission medications   Medication Sig Start Date End Date Taking? Authorizing Provider  amLODipine (NORVASC) 10 MG tablet TAKE 0.5 TABLETS (5 MG TOTAL) BY MOUTH DAILY. 01/08/19  Yes SagardiaInes Bloomer, MD  aspirin 81 MG tablet Take 1 tablet (81 mg total) by mouth daily. 08/11/16  Yes Karamalegos, Alexander J, DO  carvedilol (COREG) 6.25 MG tablet TAKE 1 TABLET BY MOUTH TWICE A DAY WITH FOOD 10/21/18  Yes Sagardia, Ines Bloomer, MD  DULoxetine  (CYMBALTA) 30 MG capsule Take 30 mg by mouth daily. 07/24/18  Yes [provider]  gabapentin (NEURONTIN) 300 MG capsule Take 3 capsules (900 mg total) by mouth 3 (three) times daily. 10/21/18  Yes Wieting, Richard, MD  hydrochlorothiazide (HYDRODIURIL) 12.5 MG tablet TAKE 1 TABLET BY MOUTH EVERY DAY Patient taking differently: Take 12.5 mg by mouth daily.  06/15/18  Yes Sagardia, Ines Bloomer, MD  losartan (COZAAR) 100 MG tablet Take 1 tablet (100 mg total) by mouth daily. 05/01/19  Yes Sagardia, Ines Bloomer, MD  Melatonin 10 MG TABS Take 10 mg by mouth at bedtime as needed (sleep).   Yes [provider]  omeprazole (PRILOSEC) 20 MG capsule Take 20 mg by mouth daily.    Yes [provider]  Semaglutide (OZEMPIC, 1 MG/DOSE, [place]) Inject 1 mg into the skin once a week.    Yes [provider]  Blood Glucose Monitoring Suppl (ONE TOUCH ULTRA SYSTEM KIT) w/Device KIT 1 kit by Does not apply route once. Dx. E11.9 07/28/15   Arlis Porta., MD  buPROPion (WELLBUTRIN XL) 150 MG 24 hr tablet Take 1 tablet (150 mg total) by mouth daily. Patient not taking: Reported on 07/30/2019 10/27/18   Epifanio Lesches, MD  glucose blood test strip 1 each by Other route 4 (four) times daily. Use with One touch meter to check blood sugar. Dx E11.9 09/13/16   Mikey College, NP  HYDROcodone-acetaminophen (NORCO/VICODIN) 5-325 MG tablet Take 1-2 tablets by mouth every 4 (four) hours as needed for moderate pain or severe pain. Patient not taking: Reported on 07/30/2019 10/26/18   Epifanio Lesches, MD  Insulin Degludec 200 UNIT/ML SOPN Inject 80 Units into the skin at bedtime.     [provider]  Insulin Pen Needle (BD PEN NEEDLE NANO U/F) 32G X 4 MM MISC Inject 1 each as directed 3 (three) times daily. 04/18/18   Forrest Moron, MD  metFORMIN (GLUCOPHAGE) 500 MG tablet Take 1,000 mg by mouth 2 (two) times daily with a meal.    [provider]  Multiple Vitamin  (MULTIVITAMIN WITH MINERALS) TABS tablet Take 1 tablet by mouth daily.    [provider]  ONE TOUCH LANCETS MISC 1 each by Does not apply route 4 (four) times daily. Use with one touch meter to check blood sugar. Dx: E11.9 09/16/16   Mikey College, NP  rosuvastatin (CRESTOR) 20 MG tablet Take 20 mg by mouth daily.    [provider]  SYNJARDY 09-998 MG TABS Take 1 tablet by mouth 2 (two) times daily with a meal. 07/06/18   [provider]     Positive ROS: All other systems have been reviewed and were otherwise negative with the exception of those mentioned in  the HPI and as above.  Physical Exam: General: Alert, no acute distress Cardiovascular: Regular rate and rhythm, no murmurs rubs or gallops.  No pedal edema Respiratory: Clear to auscultation bilaterally, no wheezes rales or rhonchi. No cyanosis, no use of accessory musculature GI: No organomegaly, abdomen is soft and non-tender nondistended with positive bowel sounds. Skin: Skin intact, no lesions within the operative field. Neurologic: Sensation intact distally Psychiatric: Patient is competent for consent with normal mood and affect Lymphatic: No cervical lymphadenopathy  MUSCULOSKELETAL: Right shoulder: Patient has pain at forward elevation abduction of 90 degrees.  He did not demonstrate significant weakness of the rotator cuff however.  Patient has proximal impingement signs, but no apprehension or instability.  He has full digital, wrist and elbow range of motion, intact sensation light touch and palpable radial pulse.  He has a positive O'Brien's test and pain with speeds test.  Assessment: Right shoulder SLAP tear, subacromial Impingement and acromioclavicular joint arthrosis  Plan: Plan for Procedure(s): RIGHT SHOULDER ARTHROSCOPIC SUBACROMIAL DECOMPRESSION, DISTAL CLAVICLE EXCISION and Biceps Tenotomy  I reviewed the details of the operation as well as the postoperative course with the  patient.  I discussed the risks and benefits of surgery. The risks include but are not limited to infection, bleeding, nerve or blood vessel injury, joint stiffness or loss of motion, persistent pain, weakness or instability and the need for further surgery. Medical risks include but are not limited to DVT and pulmonary embolism, myocardial infarction, stroke, pneumonia, respiratory failure and death. Patient understood these risks and wished to proceed.    [place]ton [place], MD   08/07/2019 7:27 AM

## 2019-08-07 NOTE — Progress Notes (Signed)
Repositioned patient in recliner, feet let down, back of chair upright and patient on 2 liters oxygen via [place].  Patient talkative and alert.

## 2019-08-07 NOTE — Progress Notes (Signed)
While attempting to stand with physical therapy, oxygen is 88-89 on room air.  Dr. Martha Clan aware and is at the desk watching patient.  Will speak with wife and admit patient.

## 2019-08-07 NOTE — Progress Notes (Signed)
Physical therapy at bedside to evaluate patient.  Oxygen 94-95 on room air.  No acute distress.

## 2019-08-07 NOTE — Op Note (Signed)
08/07/2019  4:26 PM  PATIENT:  Nathan Dean  64 y.o. male  PRE-OPERATIVE DIAGNOSIS:  Right shoulder SLAP tear, subacromial impingement and AC joint arthrosis  POST-OPERATIVE DIAGNOSIS: Right shoulder high grade partial thickness rotator cuff tear, SLAP tear, biceps tendonosis, subacromial impingement, intra-articular loose body and AC joint arthrosis  PROCEDURE:  Procedure(s): RIGHT SHOULDER ARTHROSCOPIC BICEPS TENOTOMY, DEBRIDEMENT OF SUPERIOR LABRUM, REMOVAL OF CHONDRAL LOOSE BODY, SUBACROMIAL DECOMPRESSION, DISTAL CLAVICLE EXCISION AND MINI-OPEN ROTATOR CUFF REPAIR  SURGEON:  Surgeon(s) and Role:    Thornton Park, MD - Primary  ANESTHESIA:   general and paracervical block   PREOPERATIVE INDICATIONS:  Nathan Dean is a  64 y.o. male with a diagnosis of Right shoulder high-grade partial-thickness rotator cuff tear, SLAP Tear, Impingement and AC Joint Arthrosis who failed conservative measures and elected for surgical management.    The risks benefits and alternatives were discussed with the patient preoperatively including but not limited to the risks of infection, bleeding, nerve injury, persistent pain or weakness, failure of the hardware, re-tear of the rotator cuff and the need for further surgery. Medical risks include DVT and pulmonary embolism, myocardial infarction, stroke, pneumonia, respiratory failure and death. Patient understood these risks and wished to proceed.  OPERATIVE IMPLANTS: ArthroCare Magnum 2 anchors x 1 & [employer]hew Q Fix anchors x 2  OPERATIVE PROCEDURE: The patient was met in the preoperative area. The right shoulder was signed with the word yes and my initials according the hospital's correct site of surgery protocol.  The patient underwent placement of an interscalene block with Exparel by the anesthesia service in the preoperative area.  The patient was then brought to the OR and underwent general endotracheal intubation by the anesthesia  service.  The patient was placed in a beachchair position. A spider arm positioner was used for this case. Examination under anesthesia revealed no significant loss of passive motion, no instability to load-and-shift testing and a negative sulcus sign.  The patient was prepped and draped in a sterile fashion. A timeout was performed to verify the patient's name, date of birth, medical record number, correct site of surgery and correct procedure to be performed there was also used to verify the patient received antibiotics that all appropriate instruments, implants and radiographs studies were available in the room. Once all in attendance were in agreement case began.  Patient received 900 mg of IV clindamycin due to a penicillin (anaphylaxis) and cephalosporin allergy.  Bony landmarks were drawn out with a surgical marker along with proposed arthroscopy incisions.  An 11 blade was used to establish a posterior portal through which the arthroscope was placed in the glenohumeral joint. A full diagnostic examination of the shoulder was performed. The anterior portal was established under direct visualization with an 18-gauge spinal needle.  A 5.75 mm arthroscopic cannula was placed through the anterior portal.   The intra-articular portion of the biceps tendon was found to have a diffuse tendinosis in the setting of a superior labral tear. Therefore the decision was made to perform a tenotomy.  An arthroscopic scissor was used to release the biceps tendon off the superior labrum. A 4.44m arthroscopic resector shaver was then used to debride the frayed edges of the superior labrum. There were no anterior labral tear seen.  Subscapularis tendon was intact. Patient had a high-grade partial thickness tear involving the supraspinatus.  A chondral loose body was seen at the greater tuberosity just inferior to the infraspinatus and removed with a four oh resector  shaver blade.  The scope was placed into the inferior  recess and no HAGL lesion or chondral loose body was seen.  The greater tuberosity was debrided of torn fibers of the supraspinatus with a 4.0 mm resector shaver blade.  The greater tuberosity was debrided on burr mode with a 4.0 mm resector shaver blade until punctate bleeding was identified.  The arthroscope was then placed in the subacromial space. A lateral portal was then established using an 18-gauge spinal needle for localization.   Extensive bursitis was encountered and debrided using a 4.0 resector shaver blade and a 90 ArthroCare wand from the lateral portal. A subacromial decompression was also performed using a 5.5 mm resector shaver blade from the lateral portal. The 5.5 mm resector shaver blade was then placed through the anterior portal and distal clavicle excision was performed. The high grade partial-thickness supraspinatus tear was then completed from the lateral portal using a 4.0 mm resector shaver blade.  Three ArthroCare Perfect Pass sutures were then placed in the lateral border of the rotator cuff tear. All arthroscopic instruments were then removed and the mini-open portion of the procedure began.   A saber-type incision was made along the lateral border of the acromion. The deltoid muscle was identified and split in line with its fibers which allowed visualization of the rotator cuff. The three Perfect Pass sutures previously placed in the lateral border of the rotator cuff werealso brought out through the deltoid split. Two Q-Fix anchors were then placed at the articular margin of the humeral head and greater tuberosity. The four suture limbs of each Q Fix anchor were passed medially through the rotator cuff using a first pass suture passer. The Perfect Pass sutures from the lateral border of the rotator cuff were then anchored to thegreater tuberosity of the humeral head using a single Multifix anchor. The sutures were tensioned to allow for anatomic reduction of the rotator  cuff to the greater tuberosity footprint. The medial row repair was then completed using an arthroscopic knot tying technique with the Q fix anchor sutures. Once all sutures were tied down, arthroscopic images of the double row repair were taken with the arthroscope both externally and arthroscopically fromthe glenohumeral joint  All incisions were copiously irrigated. The deltoid fascia was repaired using a 0 Vicryl suturean interrupted fashion. The subcutaneous tissue of all incisions were closed with a 2-0 Vicryl. Skin closure for the arthroscopic incisions was performed with 4-0 nylon. The skin edges of the saber incision were approximated with a running 4-0 undyed Monocryl. A dry sterile dressing including Steri-Strips was applied . The patient was placed in an abduction sling, with a Polar Care sleeve.  All sharp and instrument counts were correct at the conclusion of the case. I was scrubbed and present for the entire case. I spoke with the patient's wife in the post-op consultation room and informed her that the case had been performed without complication and the patient was stable in recovery room.     Timoteo Gaul, MD

## 2019-08-07 NOTE — Progress Notes (Signed)
Patient care taken over at 11:50.

## 2019-08-07 NOTE — Progress Notes (Signed)
Spoke with wife updated patient, she has his walker here. Will speak with Dr. Martha Clan about using weight bearing with the walker or not.

## 2019-08-07 NOTE — Transfer of Care (Signed)
Immediate Anesthesia Transfer of Care Note  Patient: Nathan Dean  Procedure(s) Performed: SHOULDER ARTHROSCOPY WITH SUBACROMIAL DECOMPRESSION AND DISTAL CLAVICLE EXCISION and Biceps Tenotomy (Right ) ROTATOR CUFF REPAIR SHOULDER OPEN (Right Shoulder)  Patient Location: PACU  Anesthesia Type:General  Level of Consciousness: awake, alert  and oriented  Airway & Oxygen Therapy: Patient Spontanous Breathing and Patient connected to face mask oxygen  Post-op Assessment: Report given to RN and Post -op Vital signs reviewed and stable  Post vital signs: Reviewed and stable  Last Vitals:  Vitals Value Taken Time  BP 117/78 08/07/19 1059  Temp 35.3 C 08/07/19 1059  Pulse 74 08/07/19 1100  Resp 17 08/07/19 1100  SpO2 92 % 08/07/19 1100  Vitals shown include unvalidated device data.  Last Pain:  Vitals:   08/07/19 1059  TempSrc: Temporal  PainSc:          Complications: No apparent anesthesia complications

## 2019-08-07 NOTE — Anesthesia Postprocedure Evaluation (Signed)
Anesthesia Post Note  Patient: Clenton Esper  Procedure(s) Performed: SHOULDER ARTHROSCOPY WITH SUBACROMIAL DECOMPRESSION AND DISTAL CLAVICLE EXCISION and Biceps Tenotomy (Right ) ROTATOR CUFF REPAIR SHOULDER OPEN (Right Shoulder)  Patient location during evaluation: PACU Anesthesia Type: General Level of consciousness: awake and alert and oriented Pain management: pain level controlled Vital Signs Assessment: post-procedure vital signs reviewed and stable Respiratory status: spontaneous breathing, nonlabored ventilation and respiratory function stable Cardiovascular status: blood pressure returned to baseline and stable Postop Assessment: no signs of nausea or vomiting Anesthetic complications: no     Last Vitals:  Vitals:   08/07/19 1059 08/07/19 1124  BP: 117/78   Pulse: 74 74  Resp: (!) 23 18  Temp: (!) 35.3 C (!) 36.1 C  SpO2: 93% 92%    Last Pain:  Vitals:   08/07/19 1059  TempSrc: Temporal  PainSc: 0-No pain                 Edona Schreffler

## 2019-08-07 NOTE — Consult Note (Signed)
[facility] [place] NAME: Nathan Dean    MR#:  [id number]  DATE OF BIRTH:  1956/02/24  DATE OF ADMISSION:  08/07/2019  PRIMARY CARE PHYSICIAN: Wardell Honour, MD   REQUESTING/REFERRING PHYSICIAN: Thornton Park, MD  CHIEF COMPLAINT:  No chief complaint on file.  HISTORY OF PRESENT ILLNESS:  Nathan Dean  is a 64 y.o. male with a known history of DM,sleep apnea underwent shoulder arthroscopy with biceps tenotomy versus tenodesis, subacromial decompression and distal clavicle excision for right shoulder SLAP tear, subacromial Impingement and acromioclavicular joint arthrosis by Dr Mack Guise. Medical c/s requested for DM mgmt. PAST MEDICAL HISTORY:   Past Medical History:  Diagnosis Date  . 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 Diabetes ([place])   . GERD (gastroesophageal reflux disease)   . Hyperlipidemia   . Hypertension   . Sleep apnea     PAST SURGICAL HISTOIRY:   Past Surgical History:  Procedure Laterality Date  . BACK SURGERY     in the 80's -herniated disc  . BACK SURGERY     L4 bone spur 1990  . CARPAL TUNNEL RELEASE Right 12/28/2016   Procedure: RIGHT ULNAR AND MEDIAN NEUROPLASTY AT WRIST;  Surgeon: Milly Jakob, MD;  Location: [facility];  Service: Orthopedics;  Laterality: Right;  . CARPAL TUNNEL RELEASE Left    25 years ago  . CATARACT EXTRACTION W/ INTRAOCULAR LENS IMPLANT Bilateral   . 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 TONSILLECTOMY      SOCIAL HISTORY:   Social History   Tobacco Use  . Smoking status: Former Smoker    Packs/day: 2.00    Years: 20.00    Pack years: 40.00    Types: Cigarettes    Quit date: 06/28/1996    Years since quitting: 23.1  . Smokeless tobacco: Never Used  Substance Use Topics  . Alcohol use: Not Currently    Alcohol/week: 0.0 standard drinks    FAMILY HISTORY:   Family History  Problem Relation Age of  Onset  . Ovarian cancer Mother   . Kidney failure Father   . Cancer Brother     DRUG ALLERGIES:   Allergies  Allergen Reactions  . Penicillins Anaphylaxis, Rash and Other (See Comments)  . Succinylcholine Chloride Anaphylaxis and Rash  . Keflex [Cephalexin] Rash  . Sulfa Antibiotics Rash and Other (See Comments)    REVIEW OF SYSTEMS:  CONSTITUTIONAL: No fever, fatigue or weakness.  EYES: No blurred or double vision.  EARS, NOSE, AND THROAT: No tinnitus or ear pain.  RESPIRATORY: No cough, shortness of breath, wheezing or hemoptysis.  CARDIOVASCULAR: No chest pain, orthopnea, edema.  GASTROINTESTINAL: No nausea, vomiting, diarrhea or abdominal pain.  GENITOURINARY: No dysuria, hematuria.  ENDOCRINE: No polyuria, nocturia,  HEMATOLOGY: No anemia, easy bruising or bleeding SKIN: No rash or lesion. MUSCULOSKELETAL: rt shoulder joint pain  NEUROLOGIC: No tingling, numbness, weakness.  PSYCHIATRY: No anxiety or depression.   MEDICATIONS AT HOME:   Prior to Admission medications   Medication Sig Start Date End Date Taking? Authorizing Provider  amLODipine (NORVASC) 10 MG tablet TAKE 0.5 TABLETS (5 MG TOTAL) BY MOUTH DAILY. 01/08/19  Yes SagardiaInes Bloomer, MD  aspirin 81 MG tablet Take 1 tablet (81 mg total) by mouth daily. 08/11/16  Yes Karamalegos, Alexander J, DO  carvedilol (COREG) 6.25 MG tablet TAKE 1 TABLET BY MOUTH TWICE A DAY WITH FOOD 10/21/18  Yes Horald Pollen, MD  DULoxetine (CYMBALTA) 30 MG capsule Take 30 mg by mouth daily. 07/24/18  Yes [provider]  gabapentin (NEURONTIN) 300 MG capsule Take 3 capsules (900 mg total) by mouth 3 (three) times daily. 10/21/18  Yes Wieting, Richard, MD  hydrochlorothiazide (HYDRODIURIL) 12.5 MG tablet TAKE 1 TABLET BY MOUTH EVERY DAY Patient taking differently: Take 12.5 mg by mouth daily.  06/15/18  Yes Sagardia, Ines Bloomer, MD  losartan (COZAAR) 100 MG tablet Take 1 tablet (100 mg total) by mouth daily. 05/01/19  Yes  Sagardia, Ines Bloomer, MD  Melatonin 10 MG TABS Take 10 mg by mouth at bedtime as needed (sleep).   Yes [provider]  omeprazole (PRILOSEC) 20 MG capsule Take 20 mg by mouth daily.    Yes [provider]  Semaglutide (OZEMPIC, 1 MG/DOSE, [place]) Inject 1 mg into the skin once a week.    Yes [provider]  Blood Glucose Monitoring Suppl (ONE TOUCH ULTRA SYSTEM KIT) w/Device KIT 1 kit by Does not apply route once. Dx. E11.9 07/28/15   Arlis Porta., MD  buPROPion (WELLBUTRIN XL) 150 MG 24 hr tablet Take 1 tablet (150 mg total) by mouth daily. Patient not taking: Reported on 07/30/2019 10/27/18   Epifanio Lesches, MD  glucose blood test strip 1 each by Other route 4 (four) times daily. Use with One touch meter to check blood sugar. Dx E11.9 09/13/16   Mikey College, NP  HYDROcodone-acetaminophen (NORCO/VICODIN) 5-325 MG tablet Take 1-2 tablets by mouth every 4 (four) hours as needed for moderate pain or severe pain. Patient not taking: Reported on 07/30/2019 10/26/18   Epifanio Lesches, MD  Insulin Degludec 200 UNIT/ML SOPN Inject 80 Units into the skin at bedtime.     [provider]  Insulin Pen Needle (BD PEN NEEDLE NANO U/F) 32G X 4 MM MISC Inject 1 each as directed 3 (three) times daily. 04/18/18   Forrest Moron, MD  metFORMIN (GLUCOPHAGE) 500 MG tablet Take 1,000 mg by mouth 2 (two) times daily with a meal.    [provider]  Multiple Vitamin (MULTIVITAMIN WITH MINERALS) TABS tablet Take 1 tablet by mouth daily.    [provider]  ondansetron (ZOFRAN) 4 MG tablet Take 1 tablet (4 mg total) by mouth every 8 (eight) hours as needed for nausea or vomiting. 08/07/19   Thornton Park, MD  ONE TOUCH LANCETS MISC 1 each by Does not apply route 4 (four) times daily. Use with one touch meter to check blood sugar. Dx: E11.9 09/16/16   Mikey College, NP  oxyCODONE (OXY IR/ROXICODONE) 5 MG immediate release tablet Take 1 tablet  (5 mg total) by mouth every 4 (four) hours as needed. 08/07/19   Thornton Park, MD  rosuvastatin (CRESTOR) 20 MG tablet Take 20 mg by mouth daily.    [provider]  SYNJARDY 09-998 MG TABS Take 1 tablet by mouth 2 (two) times daily with a meal. 07/06/18   [provider]      VITAL SIGNS:  Blood pressure (!) 161/81, pulse 83, temperature (!) 96.8 F (36 C), temperature source Temporal, resp. rate 18, SpO2 92 %. PHYSICAL EXAMINATION:  GENERAL:  64 y.o.-year-old patient lying in the bed with no acute distress.  EYES: Pupils equal, round, reactive to light and accommodation. No scleral icterus. Extraocular muscles intact.  HEENT: Head atraumatic, normocephalic. Oropharynx and nasopharynx clear.  NECK:  Supple, no jugular venous distention. No thyroid enlargement,  no tenderness.  LUNGS: Normal breath sounds bilaterally, no wheezing, rales,rhonchi or crepitation. No use of accessory muscles of respiration.  CARDIOVASCULAR: S1, S2 normal. No murmurs, rubs, or gallops.  ABDOMEN: Soft, nontender, nondistended. Bowel sounds present. No organomegaly or mass.  EXTREMITIES: No pedal edema, cyanosis, or clubbing. Rt shoulder dressing intact NEUROLOGIC: Cranial nerves II through XII are intact. Muscle strength 5/5 in all extremities. Sensation intact. Gait not checked.  PSYCHIATRIC: The patient is alert and oriented x 3.  SKIN: No obvious rash, lesion, or ulcer.  LABORATORY PANEL:   CBC Recent Labs  Lab 08/03/19 0914  WBC 7.5  HGB 14.7  HCT 47.4  PLT 234   ------------------------------------------------------------------------------------------------------------------  Chemistries  Recent Labs  Lab 08/03/19 0914  NA 141  K 3.9  CL 103  CO2 29  GLUCOSE 215*  BUN 30*  CREATININE 0.86  CALCIUM 9.3   ------------------------------------------------------------------------------------------------------------------  Cardiac Enzymes No results for input(s):  TROPONINI in the last 168 hours. ------------------------------------------------------------------------------------------------------------------  RADIOLOGY:  [name] OR NERVE BLOCK-IMAGE ONLY [facility])  Result Date: 08/07/2019 There is no interpretation for this exam.  This order is for images obtained during a surgical procedure.  Please See "Surgeries" Tab for more information regarding the procedure.    EKG:   Orders placed or performed during the hospital encounter of 08/03/19  . EKG 12 lead  . EKG 12 lead    IMPRESSION AND PLAN:  36 y m with DM s/p shoulder arthroscopy for tear  * DM -SSI for now. Check A1c - c/s DM nurse  * HTN - continue home meds for now  * hyperlipidemia - continue statin  * rt shoulder pain S/p arthroscopy. Mgmt per primary team    All the records are reviewed and case discussed with Consulting provider. Management plans discussed with the patient, ortho, nursing and they are in agreement.  CODE STATUS: FULL CODE  TOTAL TIME TAKING CARE OF THIS PATIENT: 25 minutes.    Max Sane M.D on 08/07/2019 at 5:44 PM  [facility]   CC: Primary care Physician: Wardell Honour, MD   Note: This dictation was prepared with Dragon dictation along with smaller phrase technology. Any transcriptional errors that result from this process are unintentional.

## 2019-08-07 NOTE — Discharge Instructions (Addendum)
Interscalene Nerve Block, Care After This sheet gives you information about how to care for yourself after your procedure. Your health care provider may also give you more specific instructions. If you have problems or questions, contact your health care provider. What can I expect after the procedure? After the procedure, it is common to have:  Soreness or tenderness in your neck.  Numbness in your shoulder, upper arm, and some fingers.  Weakness in your shoulder and arm muscles. The feeling and strength in your shoulder, arm, and fingers should return to normal within hours after your procedure. Follow these instructions at home: For at least 24 hours after the procedure:  Do not: ? Participate in activities in which you could fall or become injured. ? Drive. ? Use heavy machinery. ? Drink alcohol. ? Take sleeping pills or medicines that cause drowsiness. ? Make important decisions or sign legal documents. ? Take care of children on your own.  Rest. Eating and drinking  If you vomit, drink water, juice, or soup when you can drink without vomiting.  Make sure you have little or no nausea before eating solid foods.  Follow the diet that is recommended by your health care provider. If you have a sling:  Wear it as told by your health care provider. Remove it only as told by your health care provider.  Loosen the sling if your fingers tingle, become numb, or turn cold and blue.  Make sure that your entire arm, including your wrist, is supported. Do not allow your wrist to dangle over the end of the sling.  Do not let your sling get wet if it is not waterproof.  Keep the sling clean. Bathing  Do not take baths, swim, or use a hot tub until your health care provider approves.  If you have a nerve block catheter in place, keep the incision site and tubing dry. Injection site care   Wash your hands with soap and water before you change your bandage (dressing). If soap and  water are not available, use hand sanitizer.  Change your dressing as told by your health care provider.  Keep your dressing dry.  Check your nerve block injection site every day for signs of infection. Check for: ? Redness, swelling, or pain. ? Fluid or blood. ? Warmth. Activity  Do not perform complex or risky activities while taking prescription pain medicine and until you have fully recovered.  Return to your normal activities as told by your health care provider and as you can tolerate them. Ask your health care provider what activities are safe for you.  Rest and take it easy. This will help you heal and recover more quickly and fully.  Be very cautious until you have regained strength and sensation. General instructions  Have a responsible adult stay with you until you are awake and alert.  Do not drive or use heavy machinery while taking prescription pain medicine and until you have fully recovered. Ask your health care provider when it is safe to drive.  Take over-the-counter and prescription medicines only as told by your health care provider.  If you smoke, do not smoke without supervision.  Do not expose your arm or shoulder to very cold or very hot temperatures until you have full feeling back.  If you have a nerve block catheter in place: ? Try to keep the catheter from getting kinked or pinched. ? Avoid pulling or tugging on the catheter.  Keep all follow-up visits as told  by your health care provider. This is important. Contact a health care provider if:  You have chills or fever.  You have redness, swelling, or pain around your injection site.  You have fluid or blood coming from the injection site.  The skin around the injection site is warm to the touch.  There is a bad smell coming from your dressing.  You have hoarseness or a drooping or dry eye that lasts more than a few days.  You have pain that is poorly controlled with the block or with pain  medicine.  You have numbness, tingling, or weakness in your shoulder or arm that lasts for more than one week. Get help right away if:  You have severe pain.  You lose or do not regain strength and sensation in your arm even after the nerve block medicine has stopped.  You have trouble breathing.  You have a nerve block catheter still in place and you begin to shiver.  You have a nerve block catheter still in place and you are getting more and more numb or weak. This information is not intended to replace advice given to you by your health care provider. Make sure you discuss any questions you have with your health care provider. Document Revised: 05/06/2017 Document Reviewed: 01/02/2016 Elsevier Patient Education  2020 [employer]   AMBULATORY SURGERY  DISCHARGE INSTRUCTIONS   1) The drugs that you were given will stay in your system until tomorrow so for the next 24 hours you should not:  A) Drive an automobile B) Make any legal decisions C) Drink any alcoholic beverage   2) You may resume regular meals tomorrow.  Today it is better to start with liquids and gradually work up to solid foods.  You may eat anything you prefer, but it is better to start with liquids, then soup and crackers, and gradually work up to solid foods.   3) Please notify your doctor immediately if you have any unusual bleeding, trouble breathing, redness and pain at the surgery site, drainage, fever, or pain not relieved by medication.    4) Additional Instructions:        Please contact your physician with any problems or Same Day Surgery at [phone], Monday through Friday 6 am to 4 pm, or [facility] Main number at [phone]. 

## 2019-08-07 NOTE — Anesthesia Preprocedure Evaluation (Addendum)
Anesthesia Evaluation  Patient identified by MRN, date of birth, ID band Patient awake    Reviewed: Allergy & Precautions, NPO status , Patient's Chart, lab work & pertinent test results  History of Anesthesia Complications (+) DIFFICULT AIRWAY and history of anesthetic complications (succinylcholine allergy)  Airway Mallampati: III  TM Distance: >3 FB Neck ROM: Full    Dental  (+) Edentulous Upper, Poor Dentition   Pulmonary sleep apnea and Continuous Positive Airway Pressure Ventilation , neg COPD, former smoker,    breath sounds clear to auscultation- rhonchi (-) wheezing      Cardiovascular hypertension, Pt. 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  No date: Diabetes (HCC) No date: GERD (gastroesophageal reflux disease) No date: Hyperlipidemia No date: Hypertension No date: Sleep apnea   Reproductive/Obstetrics                             Anesthesia Physical Anesthesia Plan  ASA: III  Anesthesia Plan: General   Post-op Pain Management:  Regional for Post-op pain   Induction: Intravenous  PONV Risk Score and Plan: 1 and Ondansetron, Dexamethasone and Midazolam  Airway Management Planned: Oral ETT  Additional Equipment:   Intra-op Plan:   Post-operative Plan: Extubation in OR  Informed Consent: I have reviewed the patients History and  Physical, chart, labs and discussed the procedure including the risks, benefits and alternatives for the proposed anesthesia with the patient or authorized representative who has indicated his/her understanding and acceptance.     Dental advisory given  Plan Discussed with: CRNA and Anesthesiologist  Anesthesia Plan Comments: (Discussed importance of CPAP use post op while nerve block is functioning)       Anesthesia Quick Evaluation

## 2019-08-07 NOTE — Progress Notes (Signed)
Spoke with Dr. Martha Clan and physical therapy again, patients home walker is here, ideally Dr. Martha Clan does not want weight bearing on the operative arm but if we have to modify we will.  Physical therapy said they will be up shortly to see What patient can do.

## 2019-08-07 NOTE — Anesthesia Procedure Notes (Signed)
Procedure Name: Intubation Date/Time: 08/07/2019 7:49 AM Performed by: Ginger Carne, CRNA Pre-anesthesia Checklist: Patient identified, Emergency Drugs available, Suction available, Timeout performed and Patient being monitored Patient Re-evaluated:Patient Re-evaluated prior to induction Oxygen Delivery Method: Circle system utilized Preoxygenation: Pre-oxygenation with 100% oxygen Induction Type: IV induction Ventilation: Two handed mask ventilation required and Oral airway inserted - appropriate to patient size Laryngoscope Size: McGraph and 4 Grade View: Grade I Tube type: Oral Tube size: 8.0 mm Number of attempts: 1 Airway Equipment and Method: Stylet,  Video-laryngoscopy and Oral airway Placement Confirmation: ETT inserted through vocal cords under direct vision,  positive ETCO2 and breath sounds checked- equal and bilateral Secured at: 25 cm Tube secured with: Tape Dental Injury: Teeth and Oropharynx as per pre-operative assessment  Difficulty Due To: Difficulty was anticipated, Difficult Airway- due to anterior larynx and Difficult Airway- due to dentition

## 2019-08-07 NOTE — Evaluation (Signed)
Physical Therapy Evaluation Patient Details Name: Nathan Dean MRN: [id number] DOB: 07/19/1955 Today's Date: 08/07/2019   History of Present Illness  Pt is 64 yo male s/p R shoulder arthroscopy with subacromial decompression and distal clavicle excision, biceps tenotomy, and rotator cuff repair. PMH of OSA, HTN, GERD, DM, TKA  Clinical Impression  Pt alert, in recliner with RN at bedside. On 2L, trialled on room air. During activity patient satting around 89-90%, after standing ~60min, 85% and RN placed pt back on 2L. The patient exhibited significant difficulty with sit <> stand transfers. Per pt at baseline he relies on his RUE to pull up on the walker to come into standing. MaxAx2 first attempt and modAx2 second attempt. In standing the patient exhibited posterior lean that improved with verbal and assist of hip extension, but unable to tolerate upright position for longer than 1 minute. Pt attempted standing marching as well, unable to clear feet from ground. Attempted to have patient stand with platform attachment (per MD permitted for a light lean through the elbow), unable to position correctly.  Overall the patient demonstrated deficits (see "PT Problem List") that impede the patient's functional abilities, safety, and mobility and would benefit from skilled PT intervention. Recommendation is SNF pending pt progress due to decline in functional mobility, inaccessible home environment, and decreased caregiver support.     Follow Up Recommendations SNF    Equipment Recommendations  Other (comment)(TBD)    Recommendations for Other Services       Precautions / Restrictions Precautions Precautions: Fall;Shoulder Shoulder Interventions: Shoulder sling/immobilizer;Shoulder abduction pillow;At all times Required Braces or Orthoses: Sling Restrictions Weight Bearing Restrictions: Yes RUE Weight Bearing: (Per MD, prefers pt to be NWB to RUE, but would permit a "light lean" of R elbow on  platform walker) Other Position/Activity Restrictions: Per MD, prefers pt to be NWB to RUE, but would permit a "light lean" of R elbow on platform walker      Mobility  Bed Mobility               General bed mobility comments: deferred, pt up in recliner  Transfers Overall transfer level: Needs assistance Equipment used: Rolling walker (2 wheeled) Transfers: Sit to/from Stand Sit to Stand: Max assist;+2 physical assistance;Mod assist         General transfer comment: pt with significant difficulty; pt very tall, needed to rock and at least modA to achieve.  Ambulation/Gait             General Gait Details: deferred due to safety concerns  Stairs            Wheelchair Mobility    Modified Rankin (Stroke Patients Only)       Balance Overall balance assessment: Needs assistance Sitting-balance support: Feet supported Sitting balance-Leahy Scale: Fair       Standing balance-Leahy Scale: Poor Standing balance comment: pt reliant on UE support, min-modAx2 to maintain standing balance, fatigued quickly                             Pertinent Vitals/Pain Pain Assessment: No/denies pain    Home Living Family/patient expects to be discharged to:: Private residence Living Arrangements: Spouse/significant other Available Help at Discharge: Family;Available PRN/intermittently(pt wife works during the day) Type of Home: House Home Access: Stairs to enter Entrance Stairs-Rails: Right;Left;Can reach both Technical brewer of Steps: 3-4 Home Layout: One level Home Equipment: Toilet riser;Walker - 2 wheels;Other (comment);Cane - quad Additional  Comments: owns tripod cane    Prior Function Level of Independence: Independent with assistive device(s)         Comments: Ind amb in the home without an AD, tripod cane in the community, Ind with ADLs, 5 falls in the last 2 weeks, no prior significant falls history     Hand Dominance    Dominant Hand: Right    Extremity/Trunk Assessment   Upper Extremity Assessment Upper Extremity Assessment: RUE deficits/detail;LUE deficits/detail RUE Deficits / Details: s/p surgery, in sling at all times RUE: Unable to fully assess due to immobilization LUE Deficits / Details: WFLs for transfers/RW use    Lower Extremity Assessment Lower Extremity Assessment: Generalized weakness;LLE deficits/detail    Cervical / Trunk Assessment Cervical / Trunk Assessment: Normal  Communication   Communication: No difficulties  Cognition Arousal/Alertness: Awake/alert Behavior During Therapy: WFL for tasks assessed/performed Overall Cognitive Status: Within Functional Limits for tasks assessed                                        General Comments      Exercises     Assessment/Plan    PT Assessment Patient needs continued PT services  PT Problem List Decreased strength;Decreased mobility;Decreased range of motion;Decreased activity tolerance;Decreased balance;Decreased knowledge of use of DME;Pain;Decreased knowledge of precautions;Decreased safety awareness       PT Treatment Interventions DME instruction;Therapeutic activities;Gait training;Therapeutic exercise;Stair training;Balance training;Functional mobility training;Neuromuscular re-education;Patient/family education    PT Goals (Current goals can be found in the Care Plan section)  Acute Rehab PT Goals Patient Stated Goal: to go home PT Goal Formulation: With patient Time For Goal Achievement: 08/21/19 Potential to Achieve Goals: Good    Frequency BID   Barriers to discharge Decreased caregiver support;Inaccessible home environment      Co-evaluation               AM-PAC PT "6 Clicks" Mobility  Outcome Measure Help needed turning from your back to your side while in a flat bed without using bedrails?: A Lot Help needed moving from lying on your back to sitting on the side of a flat bed  without using bedrails?: A Lot Help needed moving to and from a bed to a chair (including a wheelchair)?: A Lot Help needed standing up from a chair using your arms (e.g., wheelchair or bedside chair)?: A Lot Help needed to walk in hospital room?: Total Help needed climbing 3-5 steps with a railing? : Total 6 Click Score: 10    End of Session Equipment Utilized During Treatment: Gait belt;Oxygen;Other (comment)(R sling) Activity Tolerance: Patient tolerated treatment well Patient left: in chair;with nursing/sitter in room Nurse Communication: Mobility status PT Visit Diagnosis: Other abnormalities of gait and mobility (R26.89);Muscle weakness (generalized) (M62.81);Pain Pain - Right/Left: Right Pain - part of body: Shoulder    Time: 1310-1343 PT Time Calculation (min) (ACUTE ONLY): 33 min   Charges:   PT Evaluation $PT Eval Low Complexity: 1 Low PT Treatments $Therapeutic Exercise: 23-37 mins        Olga Coaster PT, DPT 2:08 PM,08/07/19

## 2019-08-07 NOTE — Progress Notes (Signed)
  Subjective:  POST-OP CHECK: Patient in PACU stepdown unit.  Patient is alert and denies any significant pain.  States his block is still working.  Patient's latest blood sugar was over 300.  Objective:   VITALS:   Vitals:   08/07/19 1257 08/07/19 1345 08/07/19 1417 08/07/19 1719  BP: 125/79 (!) 146/93 136/78 (!) 161/81  Pulse: 78 80 83   Resp: 16 19 19 18   Temp:   (!) 96.8 F (36 C)   TempSrc:   Temporal   SpO2: 93% 95% 95% 92%    PHYSICAL EXAM: Right upper extremity: Patient's dressing is clean dry and intact.  Patient can flex and extend his fingers.  He has intact sensation light touch in his fingers and upper arm.  His fingers well-perfused and he is a palpable radial pulse.  His right arm is in a sling.  He is wearing a [employer] Care.  LABS  Results for orders placed or performed during the hospital encounter of 08/07/19 (from the past 24 hour(s))  Glucose, capillary     Status: Abnormal   Collection Time: 08/07/19  6:24 AM  Result Value Ref Range   Glucose-Capillary 206 (H) 70 - 99 mg/dL  Glucose, capillary     Status: Abnormal   Collection Time: 08/07/19 11:01 AM  Result Value Ref Range   Glucose-Capillary 182 (H) 70 - 99 mg/dL  Glucose, capillary     Status: Abnormal   Collection Time: 08/07/19  4:57 PM  Result Value Ref Range   Glucose-Capillary 330 (H) 70 - 99 mg/dL    08/09/19 OR NERVE BLOCK-IMAGE ONLY [facility])  Result Date: 08/07/2019 There is no interpretation for this exam.  This order is for images obtained during a surgical procedure.  Please See "Surgeries" Tab for more information regarding the procedure.    Assessment/Plan: Day of Surgery   Active Problems:   S/P right rotator cuff repair  Patient is stable postop.  I have spoken with Dr. 08/09/2019 via haiku text to ask his assistance with management of the patient's hyperglycemia.  Dr. Delfino Lovett will see patient in consultation.  Patient is being admitted overnight for observation after he had oxygen  desaturations postoperatively in the PACU.  Patient will also need further evaluation by physical and Occupational Therapy.  Patient uses a walker at baseline and is 6 foot 9 inches tall.  He will need appropriately sized assistance devices and DME.  Ideally the patient will be nonweightbearing on the right upper extremity in his sling.  Given issues with balance, he may need to weight-bear through the right operative shoulder to prevent falling.  He may require a platform walker.   Sherryll Burger , MD 08/07/2019, 5:31 PM

## 2019-08-07 NOTE — Progress Notes (Addendum)
Patient arrived on unit. Breathing is even and unlabored. No distress noted. All safety measures are in place. Cold therapy in place on right shoulder. Released signed and held orders. Orders are confusing and not clear, with all home meds showing as due. Patient's blood pressure is also elevated. Dr. Sherryll Burger at bedside, ordered PRN hydralazine 10 mg IV for SBP above 160. He is aware of patient's BP. He also ordered sliding scale.   Many of medications due patient refused because he no longer takes them. Dr. Susette Racer notified and stated to give patient BP meds and all evening medications he normally takes at home. Holding hydralazine for now so as not to bottom patient out.   Notified Dr. Sherryll Burger that patient's blood sugar is 250 but the sliding scale is not on chart so cannot be covered. Requested order be placed. SWOT nurse performed admission over phone. Patient is stable. RN did not have time to complete a full assessment, but patient does not appear to be in distress. He is oriented to room and unit and stable.

## 2019-08-07 NOTE — Progress Notes (Signed)
Asked patient about eating , declined for now, gave some popsicle and continues to drink his drink.  No acute distress. Dr. Martha Clan speaking with his wife and awaiting admit orders.

## 2019-08-07 NOTE — Anesthesia Procedure Notes (Signed)
Anesthesia Regional Block: Interscalene brachial plexus block   Pre-Anesthetic Checklist: ,, timeout performed, Correct Patient, Correct Site, Correct Laterality, Correct Procedure, Correct Position, site marked, Risks and benefits discussed,  Surgical consent,  Pre-op evaluation,  At surgeon's request and post-op pain management  Laterality: Right  Prep: chloraprep       Needles:  Injection technique: Single-shot  Needle Type: Stimiplex     Needle Length: 10cm  Needle Gauge: 21     Additional Needles:   Procedures:,,,, ultrasound used (permanent image in chart),,,,  Narrative:  Start time: 08/07/2019 7:24 AM End time: 08/07/2019 7:28 AM Injection made incrementally with aspirations every 5 mL.  Performed by: Personally  Anesthesiologist: Alver Fisher, MD  Additional Notes: Functioning IV was confirmed and monitors were applied.  A Stimuplex needle was used. Sterile prep and drape,hand hygiene and sterile gloves were used.  Negative aspiration and negative test dose prior to incremental administration of local anesthetic. The patient tolerated the procedure well.

## 2019-08-07 NOTE — Progress Notes (Signed)
Patient sitting up in a recliner, uses his incentive spirometer from time to time.  Notified PT, to come evaluate the ability of the patient so we know if he can discharge or not.  Oxygen on room air ranges from 90-94.  Patient alert and oriented, no acute distress.

## 2019-08-08 ENCOUNTER — Inpatient Hospital Stay: Payer: Medicare Other

## 2019-08-08 DIAGNOSIS — X58XXXA Exposure to other specified factors, initial encounter: Secondary | ICD-10-CM | POA: Diagnosis present

## 2019-08-08 DIAGNOSIS — J9811 Atelectasis: Secondary | ICD-10-CM | POA: Diagnosis not present

## 2019-08-08 DIAGNOSIS — Z7982 Long term (current) use of aspirin: Secondary | ICD-10-CM | POA: Diagnosis not present

## 2019-08-08 DIAGNOSIS — M7541 Impingement syndrome of right shoulder: Secondary | ICD-10-CM | POA: Diagnosis present

## 2019-08-08 DIAGNOSIS — E785 Hyperlipidemia, unspecified: Secondary | ICD-10-CM | POA: Diagnosis present

## 2019-08-08 DIAGNOSIS — Z888 Allergy status to other drugs, medicaments and biological substances status: Secondary | ICD-10-CM | POA: Diagnosis not present

## 2019-08-08 DIAGNOSIS — Z9889 Other specified postprocedural states: Secondary | ICD-10-CM | POA: Diagnosis not present

## 2019-08-08 DIAGNOSIS — Z87891 Personal history of nicotine dependence: Secondary | ICD-10-CM | POA: Diagnosis not present

## 2019-08-08 DIAGNOSIS — R0902 Hypoxemia: Secondary | ICD-10-CM

## 2019-08-08 DIAGNOSIS — Z79890 Hormone replacement therapy (postmenopausal): Secondary | ICD-10-CM | POA: Diagnosis not present

## 2019-08-08 DIAGNOSIS — Z880 Allergy status to penicillin: Secondary | ICD-10-CM | POA: Diagnosis not present

## 2019-08-08 DIAGNOSIS — Z794 Long term (current) use of insulin: Secondary | ICD-10-CM | POA: Diagnosis not present

## 2019-08-08 DIAGNOSIS — Z20822 Contact with and (suspected) exposure to covid-19: Secondary | ICD-10-CM | POA: Diagnosis present

## 2019-08-08 DIAGNOSIS — S43431A Superior glenoid labrum lesion of right shoulder, initial encounter: Secondary | ICD-10-CM | POA: Diagnosis present

## 2019-08-08 DIAGNOSIS — M19011 Primary osteoarthritis, right shoulder: Secondary | ICD-10-CM | POA: Diagnosis present

## 2019-08-08 DIAGNOSIS — G4730 Sleep apnea, unspecified: Secondary | ICD-10-CM | POA: Diagnosis present

## 2019-08-08 DIAGNOSIS — Z96653 Presence of artificial knee joint, bilateral: Secondary | ICD-10-CM | POA: Diagnosis present

## 2019-08-08 DIAGNOSIS — Z961 Presence of intraocular lens: Secondary | ICD-10-CM | POA: Diagnosis present

## 2019-08-08 DIAGNOSIS — K219 Gastro-esophageal reflux disease without esophagitis: Secondary | ICD-10-CM | POA: Diagnosis present

## 2019-08-08 DIAGNOSIS — Z9841 Cataract extraction status, right eye: Secondary | ICD-10-CM | POA: Diagnosis not present

## 2019-08-08 DIAGNOSIS — Z882 Allergy status to sulfonamides status: Secondary | ICD-10-CM | POA: Diagnosis not present

## 2019-08-08 DIAGNOSIS — Z841 Family history of disorders of kidney and ureter: Secondary | ICD-10-CM | POA: Diagnosis not present

## 2019-08-08 DIAGNOSIS — Z8041 Family history of malignant neoplasm of ovary: Secondary | ICD-10-CM | POA: Diagnosis not present

## 2019-08-08 DIAGNOSIS — M75111 Incomplete rotator cuff tear or rupture of right shoulder, not specified as traumatic: Secondary | ICD-10-CM | POA: Diagnosis present

## 2019-08-08 DIAGNOSIS — R0603 Acute respiratory distress: Secondary | ICD-10-CM | POA: Diagnosis not present

## 2019-08-08 DIAGNOSIS — Z9842 Cataract extraction status, left eye: Secondary | ICD-10-CM | POA: Diagnosis not present

## 2019-08-08 DIAGNOSIS — I10 Essential (primary) hypertension: Secondary | ICD-10-CM | POA: Diagnosis present

## 2019-08-08 LAB — CBC
HCT: 41.3 % (ref 39.0–52.0)
Hemoglobin: 13.1 g/dL (ref 13.0–17.0)
MCH: 28 pg (ref 26.0–34.0)
MCHC: 31.7 g/dL (ref 30.0–36.0)
MCV: 88.2 fL (ref 80.0–100.0)
Platelets: 188 10*3/uL (ref 150–400)
RBC: 4.68 MIL/uL (ref 4.22–5.81)
RDW: 15.2 % (ref 11.5–15.5)
WBC: 9.3 10*3/uL (ref 4.0–10.5)
nRBC: 0 % (ref 0.0–0.2)

## 2019-08-08 LAB — BASIC METABOLIC PANEL
Anion gap: 7 (ref 5–15)
BUN: 13 mg/dL (ref 8–23)
CO2: 28 mmol/L (ref 22–32)
Calcium: 8.7 mg/dL — ABNORMAL LOW (ref 8.9–10.3)
Chloride: 106 mmol/L (ref 98–111)
Creatinine, Ser: 0.63 mg/dL (ref 0.61–1.24)
GFR calc Af Amer: >60 mL/min (ref >60)
GFR calc non Af Amer: >60 mL/min (ref >60)
Glucose, Bld: 215 mg/dL — ABNORMAL HIGH (ref 70–99)
Potassium: 4.2 mmol/L (ref 3.5–5.1)
Sodium: 141 mmol/L (ref 135–145)

## 2019-08-08 LAB — GLUCOSE, CAPILLARY
Glucose-Capillary: 209 mg/dL — ABNORMAL HIGH (ref 70–99)
Glucose-Capillary: 159 mg/dL — ABNORMAL HIGH (ref 70–99)
Glucose-Capillary: 182 mg/dL — ABNORMAL HIGH (ref 70–99)
Glucose-Capillary: 229 mg/dL — ABNORMAL HIGH (ref 70–99)

## 2019-08-08 LAB — BRAIN NATRIURETIC PEPTIDE: B Natriuretic Peptide: 152 pg/mL — ABNORMAL HIGH (ref 0.0–100.0)

## 2019-08-08 MED ORDER — FUROSEMIDE 10 MG/ML IJ SOLN
40.0000 mg | Freq: Once | INTRAMUSCULAR | Status: AC
  Administered 2019-08-08: 40 mg via INTRAVENOUS
  Filled 2019-08-08: qty 4

## 2019-08-08 MED ORDER — INSULIN GLARGINE 100 UNIT/ML ~~LOC~~ SOLN
85.0000 [IU] | Freq: Every day | SUBCUTANEOUS | Status: DC
  Administered 2019-08-08: 85 [IU] via SUBCUTANEOUS
  Filled 2019-08-08 (×2): qty 0.85

## 2019-08-08 NOTE — TOC Initial Note (Signed)
Transition of Care [facility]) - Initial/Assessment Note    Patient Details  Name: Nathan Dean MRN: [id number] Date of Birth: 27-Oct-1955  Transition of Care [facility]) CM/SW Contact:    Su Hilt, RN Phone Number: 08/08/2019, 9:35 AM  Clinical Narrative:                 Met with the patient in the room, He lives at home with his wife, his wife works all day and he is alone, He has a tall walker he brought from home  He has tall special made toilets in the home and does not need a BSC He said that he needs a lift chair but is unable to find one to accommodate his height, I encouraged him to go to a medical supply store and they could maybe order one. He said that His chair at home is for tall people however the controls are on the right side and he had the right shoulder surgery and can't operate it.  He said that there is a medical supply store in [place] that he will try.  I have set him up with [facility] for [place], He is recommended to go to rehab.  I explained to him that his shoulder surgery does not meet criteria for inpatient and with basic medicare he has to have a 3 night inpatient stay to go to rehab.  He requested that I come back at 10 to explain to his wife and the physician        Patient Goals and CMS Choice        Expected Discharge Plan and Services           Expected Discharge Date: 08/07/19                                    Prior Living Arrangements/Services                       Activities of Daily Living Home Assistive Devices/Equipment: Eyeglasses, Dentures (specify type) ADL Screening (condition at time of admission) Patient's cognitive ability adequate to safely complete daily activities?: Yes Is the patient deaf or have difficulty hearing?: No Does the patient have difficulty seeing, even when wearing glasses/contacts?: No Does the patient have difficulty concentrating, remembering, or making decisions?: No Patient able to express  need for assistance with ADLs?: Yes Does the patient have difficulty dressing or bathing?: No Independently performs ADLs?: Yes (appropriate for developmental age) Does the patient have difficulty walking or climbing stairs?: No Weakness of Legs: Both Weakness of Arms/Hands: None  Permission Sought/Granted                  Emotional Assessment              Admission diagnosis:  S/P right rotator cuff repair [V61.607] Patient Active Problem List   Diagnosis Date Noted  . S/P right rotator cuff repair 08/07/2019  . Hypoxia 10/22/2018  . Infection of right prepatellar bursa 10/18/2018  . Sepsis ([place]) 10/18/2018  . Chest congestion 07/20/2017  . Viral illness 07/20/2017  . Lower respiratory infection 07/12/2017  . Laryngitis, acute 07/12/2017  . Cough 07/12/2017  . Pleurisy 07/12/2017  . Depression 06/03/2015  . HTN (hypertension) 01/02/2015  . Hyperlipidemia 01/02/2015  . 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 Sleep apnea 09/21/2010  .  Arthritis, degenerative 02/17/2009  . Hereditary and idiopathic peripheral neuropathy 11/21/2008  . AD (atopic dermatitis) 07/08/2008  . Acid reflux 04/05/2007   PCP:  Wardell Honour, MD Pharmacy:   [id number] [id number]: [phone]: [phone]    Social Determinants of Health (SDOH) Interventions    Readmission Risk Interventions Readmission Risk Prevention Plan 10/21/2018  Post Dischage Appt Complete  Medication Screening Complete  Transportation Screening Complete  Some recent data might be hidden

## 2019-08-08 NOTE — Progress Notes (Signed)
Physical Therapy Treatment Patient Details Name: Nathan Dean MRN: [id number] DOB: June 10, 1955 Today's Date: 08/08/2019    History of Present Illness Pt is 64 yo male s/p R shoulder arthroscopy with subacromial decompression and distal clavicle excision, biceps tenotomy, and rotator cuff repair. PMH of OSA, HTN, GERD, DM, TKA    PT Comments    Pt alert, oriented, agreeable to PT. Demonstrated bed mobility with minA and heavy use of bed rails. Trialled on room air, 88-90%. Sit <> stand attempted several times during session. From elevated bed surface pt was a maxAx2 with RW and stabilization of RW. Once in standing, the patient was able to ambulate ~32ft total with RW and CGA, close chair follow. SpO2 initially 89%, pt reported dizziness and assisted to a controlled sit in recliner, spO2 86% on room air. Placed on 2L spO2 up to 92%, pt reported resolvment of dizziness. Pt unable to transfer from recliner despite maxAx2 and RW, further assessment of ambulation deferred. Current plan remains appropriate due to decreased care giver support, decline in functional status, and inaccessible home environment.      Follow Up Recommendations  SNF     Equipment Recommendations  Other (comment)(platform attachment for pt's bariatric walker)    Recommendations for Other Services       Precautions / Restrictions Precautions Precautions: Fall;Shoulder Shoulder Interventions: Shoulder sling/immobilizer;Shoulder abduction pillow;At all times Required Braces or Orthoses: Sling Restrictions Weight Bearing Restrictions: Yes RUE Weight Bearing: Non weight bearing Other Position/Activity Restrictions: Per MD, prefers pt to be NWB to RUE, but would permit a "light lean" of R elbow on platform walker    Mobility  Bed Mobility Overal bed mobility: Needs Assistance Bed Mobility: Supine to Sit     Supine to sit: HOB elevated;Min assist     General bed mobility comments: trunk elevation minA, heavy  use of bed rails  Transfers Overall transfer level: Needs assistance Equipment used: Rolling walker (2 wheeled) Transfers: Sit to/from Stand Sit to Stand: Max assist;+2 physical assistance         General transfer comment: maxAx2 from elevated bed surface. Unable to transfer from recliner with two pillows in place to elevate surface despite maxAx2.  Ambulation/Gait   Gait Distance (Feet): 35 Feet Assistive device: Rolling walker (2 wheeled)       General Gait Details: Pt exhibited decreased stride length, decreased gait velocity, wide base of support.   Stairs             Wheelchair Mobility    Modified Rankin (Stroke Patients Only)       Balance Overall balance assessment: Needs assistance Sitting-balance support: Feet supported Sitting balance-Leahy Scale: Fair         Standing balance comment: Pt reliant on RW for static standing                            Cognition Arousal/Alertness: Awake/alert Behavior During Therapy: WFL for tasks assessed/performed Overall Cognitive Status: Within Functional Limits for tasks assessed                                        Exercises Other Exercises Other Exercises: sling adjusted for comfort/optimal wear Other Exercises: Pt ambulated ~31ft with RW, CGA and platform attachment. Endorsed dizziness, legs shaking, controlled sit to recliner (chair follow provided). spO2 on room air 86%, placed on 2L and  up to 92%. Other Exercises: Pt unable to stand despite maxAx2 from recliner to attempt further ambulation, returned to room and repositioned for comfort.    General Comments        Pertinent Vitals/Pain Pain Assessment: No/denies pain    Home Living                      Prior Function            PT Goals (current goals can now be found in the care plan section) Progress towards PT goals: Progressing toward goals    Frequency    BID      PT Plan Current plan  remains appropriate    Co-evaluation              AM-PAC PT "6 Clicks" Mobility   Outcome Measure  Help needed turning from your back to your side while in a flat bed without using bedrails?: A Lot Help needed moving from lying on your back to sitting on the side of a flat bed without using bedrails?: A Lot Help needed moving to and from a bed to a chair (including a wheelchair)?: A Lot Help needed standing up from a chair using your arms (e.g., wheelchair or bedside chair)?: A Lot Help needed to walk in hospital room?: A Little Help needed climbing 3-5 steps with a railing? : Total 6 Click Score: 12    End of Session Equipment Utilized During Treatment: Gait belt;Oxygen;Other (comment)(2L) Activity Tolerance: Other (comment)(Pt limited by dizziness) Patient left: in chair;with family/visitor present;with call bell/phone within reach;with SCD's reapplied Nurse Communication: Mobility status PT Visit Diagnosis: Other abnormalities of gait and mobility (R26.89);Muscle weakness (generalized) (M62.81);Pain Pain - Right/Left: Right Pain - part of body: Shoulder     Time: 1700-1749 PT Time Calculation (min) (ACUTE ONLY): 41 min  Charges:  $Therapeutic Exercise: 38-52 mins                     Lieutenant Diego PT, DPT 12:23 PM,08/08/19

## 2019-08-08 NOTE — Progress Notes (Addendum)
Nutrition Brief Note  Patient identified on the Malnutrition Screening Tool (MST) Report  Wt Readings from Last 15 Encounters:  07/30/19 (!) 163.3 kg  11/22/18 (!) 165.6 kg  10/24/18 (!) 165.4 kg  10/18/18 (!) 167.8 kg  05/26/18 (!) 171.9 kg  05/04/18 (!) 171.5 kg  04/18/18 (!) 170.6 kg  03/24/18 (!) 170.2 kg  03/20/18 (!) 170.4 kg  03/01/18 (!) 169.6 kg  09/06/17 (!) 171.1 kg  07/20/17 (!) 167.9 kg  07/12/17 (!) 167.3 kg  07/05/17 (!) 169.2 kg  05/31/17 (!) 174.2 kg   Met with patient and his wife at bedside. Patient reports his appetite and intake are good at baseline. His appetite is good here as well but he is hungry as he is not getting enough food. Patient is 6' 9"  and has higher calorie needs than can be obtained on current carbohydrate modified diet. RD will order double protein portions at meals. Also recommend Ensure Max Protein po BID between meals to help meet calorie/protein needs. No subcutaneous fat or muscle wasting on NFPE. Patient reports he is weight-stable. Patient does not meet criteria for malnutrition at this time.  Current diet order is carbohydrate modified, patient is consuming approximately 100% of meals at this time. Labs and medications reviewed.   No nutrition interventions warranted at this time. If nutrition issues arise, please consult RD.   Jacklynn Barnacle, MS, RD, LDN Pager number available on Amion

## 2019-08-08 NOTE — Progress Notes (Addendum)
PROGRESS NOTE    Patient: Nathan Dean                            PCP: Wardell Honour, MD                    DOB: June 03, 1955            DOA: 08/07/2019 [id number]             DOS: 08/08/2019, 1:25 PM   LOS: 0 days   Date of Service: The patient was seen and examined on 08/08/2019  Subjective:   The patient was seen and examined this morning, awake alert, very pleasant. Reporting his blood sugar spiked yesterday as he had a sugary popsicle.  Patient needing 2 L of oxygen at rest to maintain O2 sat of 93% Patient states he is not O2 dependent Per nursing staff and PT he desats with ambulation and exertion  Stating he has a meeting with social worker and orthopedic team later today regarding his disposition he prefers home with home health versus skilled facility as it was recommended by PT  Brief Narrative:    Nathan Dean  is a 64 y.o. male with a known history of DM,sleep apnea underwent shoulder arthroscopy with biceps tenotomy versus tenodesis, subacromial decompression and distal clavicle excision for rightshoulder SLAPtear, subacromialImpingement andacromioclavicular jointarthrosis by Dr Mack Guise. Medical c/s requested for DM mgmt.   Assessment & Plan:   Active Problems:   S/P right rotator cuff repair   Oxygen desaturation   Acute respiratory distress : -Likely due to deconditioning, generalized weakness, Patient continues to need minimal 2 L of oxygen to maintain O2 sat at 93% Per PT/OT with any exertion or ambulation he desats down to 80s -Monitor very closely, continue O2 supplements to maintain O2 sat greater than 92% -DC IV fluids today ... May benefit from a small dose of diuretics -We will obtain a chest x-ray -RT will be consulted, as needed DuoNeb bronchodilator treatments  diabetes mellitus type 2, uncontrolled, hyperglycemia -Lantus was 80 units nightly we will increase that to 85  Units -CBG 330, 250, 310, 209 this a.m. -Continue with SSI,  check in CBG before every meal at bedtime Home medication of Metformin, One Touch insulin -on hold   Hypertension -We will continue current medication, no titration needed Continue Cozaar, Coreg Norvasc on hold  Hyperlipidemia -Statins no changes  Right shoulder arthropathy -Status post arthroscopy, rotator cuff repair -Currently in the sling, -Pain management PT per Ortho recommendation  Depression Continue Cymbalta    Nutritional status:         Cultures;  Antimicrobials:  DVT prophylaxis: SCD/Compression stockings and Lovenox SQ Code Status:   Code Status: Full Code Family Communication: No family member present at bedside- attempt will be made to update daily The above findings and plan of care has been discussed with patient and family in detail,  they expressed understanding and agreement of above. Disposition Plan:   Anticipated 1-2 days likely home with home health Admission status:  NPATIEN -     Procedures:   SHOULDER ARTHROSCOPY WITH SUBACROMIAL DECOMPRESSION AND DISTAL CLAVICLE EXCISION     Antimicrobials:  Anti-infectives (From admission, onward)   Start     Dose/Rate Route Frequency Ordered Stop   08/07/19 1800  clindamycin (CLEOCIN) IVPB 600 mg     600 mg 100 mL/hr over 30 Minutes Intravenous Every 6 hours 08/07/19 1759 08/08/19  0136   08/07/19 0629  clindamycin (CLEOCIN) 900 MG/50ML IVPB    Note to Pharmacy: Renie Ora   : cabinet override      08/07/19 0629 08/07/19 0810   08/07/19 0615  clindamycin (CLEOCIN) IVPB 900 mg     900 mg 100 mL/hr over 30 Minutes Intravenous On call to O.R. 08/07/19 1062 08/07/19 0818       Medication:  . acetaminophen  500 mg Oral Q6H  . amLODipine  5 mg Oral Daily  . aspirin EC  81 mg Oral Daily  . carvedilol  6.25 mg Oral BID WC  . docusate sodium  100 mg Oral BID  . DULoxetine  30 mg Oral Daily  . enoxaparin (LOVENOX) injection  40 mg Subcutaneous Q24H  . gabapentin  900 mg Oral TID  .  insulin aspart  0-15 Units Subcutaneous TID WC  . insulin aspart  0-5 Units Subcutaneous QHS  . insulin glargine  85 Units Subcutaneous QHS  . losartan  100 mg Oral Daily  . metFORMIN  1,000 mg Oral BID WC  . multivitamin with minerals  1 tablet Oral Daily  . pantoprazole  40 mg Oral Daily  . rosuvastatin  20 mg Oral Daily  . senna  1 tablet Oral BID  . traMADol  50 mg Oral Q6H    acetaminophen, alum & mag hydroxide-simeth, bisacodyl, hydrALAZINE, HYDROcodone-acetaminophen, HYDROcodone-acetaminophen, magnesium citrate, melatonin, menthol-cetylpyridinium **OR** phenol, methocarbamol **OR** methocarbamol (ROBAXIN) IV, morphine injection, ondansetron **OR** ondansetron (ZOFRAN) IV, polyethylene glycol   Objective:   Vitals:   08/08/19 0725 08/08/19 1115 08/08/19 1117 08/08/19 1143  BP: (!) 166/83   (!) 143/84  Pulse: 77   77  Resp: 20   20  Temp: 97.9 F (36.6 C)   98.8 F (37.1 C)  TempSrc: Oral   Oral  SpO2: 95% (!) 86% 92% 93%    Intake/Output Summary (Last 24 hours) at 08/08/2019 1325 Last data filed at 08/08/2019 6948 Gross per 24 hour  Intake 1874.56 ml  Output 2250 ml  Net -375.44 ml   There were no vitals filed for this visit.   Examination:   Physical Exam  Constitution:  Alert, cooperative, no distress,  Appears calm and comfortable  Psychiatric: Normal and stable mood and affect, cognition intact,   HEENT: Normocephalic, PERRL, otherwise with in Normal limits  Chest:Chest symmetric Cardio vascular:  S1/S2, RRR, No murmure, No Rubs or Gallops  pulmonary: Clear to auscultation bilaterally, respirations unlabored, negative wheezes / crackles Abdomen: Soft, non-tender, non-distended, bowel sounds,no masses, no organomegaly Muscular skeletal: Limited exam - in bed, able to move all 4 extremities, Normal strength,  Neuro: CNII-XII intact. , normal motor and sensation, reflexes intact  Extremities: No pitting edema lower extremities, +2 pulses, left shoulder  sling Skin: Dry, warm to touch, negative for any Rashes, No open wounds Wounds: None visible, please see nurses documentation, Shoulder in the sling, dressing   LABs:  CBC Latest Ref Rng & Units 08/08/2019 08/03/2019 10/23/2018  WBC 4.0 - 10.5 K/uL 9.3 7.5 7.6  Hemoglobin 13.0 - 17.0 g/dL 54.6 27.0 11.5(L)  Hematocrit 39.0 - 52.0 % 41.3 47.4 36.8(L)  Platelets 150 - 400 K/uL 188 234 219   CMP Latest Ref Rng & Units 08/08/2019 08/03/2019 10/26/2018  Glucose 70 - 99 mg/dL 350(K) 938(H) 829(H)  BUN 8 - 23 mg/dL 13 37(J) 21  Creatinine 0.61 - 1.24 mg/dL 6.96 7.89 3.81  Sodium 135 - 145 mmol/L 141 141 139  Potassium 3.5 -  5.1 mmol/L 4.2 3.9 3.5  Chloride 98 - 111 mmol/L 106 103 99  CO2 22 - 32 mmol/L 28 29 32  Calcium 8.9 - 10.3 mg/dL 5.3(Z) 9.3 7.6(B)  Total Protein 6.5 - 8.1 g/dL - - -  Total Bilirubin 0.3 - 1.2 mg/dL - - -  Alkaline Phos 38 - 126 U/L - - -  AST 15 - 41 U/L - - -  ALT 0 - 44 U/L - - -    SIGNED: Kendell Bane, MD, FACP, FHM. [facility],  Pager [phone]  If 7PM-7AM, please contact night-coverage [email or web address], Password [facility] 08/08/2019, 1:25 PM

## 2019-08-08 NOTE — Plan of Care (Signed)
  Problem: Education: Goal: Knowledge of General Education information will improve Description: Including pain rating scale, medication(s)/side effects and non-pharmacologic comfort measures Outcome: Progressing   Problem: Clinical Measurements: Goal: Will remain free from infection Outcome: Progressing Note: No s/s of infection noted this shift   Problem: Coping: Goal: Level of anxiety will decrease Outcome: Progressing

## 2019-08-08 NOTE — Progress Notes (Signed)
Inpatient Diabetes Program Recommendations  AACE/ADA: New Consensus Statement on Inpatient Glycemic Control   Target Ranges:  Prepandial:   less than 140 mg/dL      Peak postprandial:   less than 180 mg/dL (1-2 hours)      Critically ill patients:  140 - 180 mg/dL  Results for NICHOLES, HIBLER (MRN [id number]) as of 08/08/2019 07:23  Ref. Range 08/08/2019 05:53  Glucose Latest Ref Range: 70 - 99 mg/dL 397 (H)    Results for JAQUELL, SEDDON (MRN [id number]) as of 08/08/2019 07:23  Ref. Range 08/07/2019 06:24 08/07/2019 11:01 08/07/2019 16:57 08/07/2019 18:07 08/07/2019 21:07  Glucose-Capillary Latest Ref Range: 70 - 99 mg/dL 024 (H) 097 (H) 353 (H) 250 (H) 310 (H)   Results for NAZIER, NEYHART (MRN [id number]) as of 08/08/2019 07:23  Ref. Range 10/22/2018 07:09 08/07/2019 18:06  Hemoglobin A1C Latest Ref Range: 4.8 - 5.6 % 7.4 (H) 6.8 (H)   Review of Glycemic Control  Diabetes history: DM2 Outpatient Diabetes medications: Basaglar 80 units QAM, Bydureon 2 mg Qweek, Metformin 1000 mg BID Current orders for Inpatient glycemic control: Lantus 80 units QHS, Novolog 0-15 units TID with meals, Novolog 0-5 units QHS, Metformin 1000 mg BID  Inpatient Diabetes Program Recommendations:    A1C: A1C 6.8% on 08/07/19 indicating an average glucose of 148 mg/dl over the past 2-3 months.  NOTE: Noted consult. Chart reviewed. Agree with current orders for inpatient glycemic control. Patient had shoulder surgery on 08/07/19 an received Decadron 10 mg at 9:03 am which is impacting glycemic control.  Lab glucose 215 mg/dl this morning.  In reviewing chart noted telephone note by Mollie Germany, NP (Endocrinology) which indicates patient is taking Basaglr 80 units QAM, Bydureon 2 mg Qweek, Metformin 1000 mg BID. Per current home medication list, Ozempic 1 mg Awilda Metro, Tresiba 80 units QHS, Metformin 1000 mg BID, Synjardy 09-998 mg BID are listed.  Spoke with patient over the phone and he confirms that he is taking Hospital doctor,  Bydureon, and Metformin as noted above. He states that he used up his Snyjardy and had a lot of Metformin so he asked H.Blackwood, NP if he could just take the Metformin for now. Patient reports glucose usually runs well in 90-low 100's mg/dl. Informed patient that A1C 6.8% on 08/07/19 indicating an average glucose of 148 mg/dl. Also informed patient that he received one time Decadron yesterday which is a steroid and is contributing to hyperglycemia. Explained that Decadron should wear off and glucose should improve.  Patient verbalized understanding of information discussed and states that he has no questions at this time.  Thanks, Orlando Penner, RN, MSN, CDE Diabetes Coordinator Inpatient Diabetes Program [phone] (Team Pager from 8am to 5pm)

## 2019-08-08 NOTE — Progress Notes (Signed)
Subjective:  POD #1 s/p right shoulder mini-open rotator cuff repair.  Patient is having issues with oxygen desaturation requiring supplemental oxygen.  His oxygen desaturation is exacerbated with physical activity such as physical therapy.  Patient reports right shoulder pain as mild to moderate.  Patient is struggling with physical therapy.  He is requiring significant assistance to stand.  He is having issues with ambulation and balance even with a platform walker.  Objective:   VITALS:   Vitals:   08/08/19 0725 08/08/19 1115 08/08/19 1117 08/08/19 1143  BP: (!) 166/83   (!) 143/84  Pulse: 77   77  Resp: 20   20  Temp: 97.9 F (36.6 C)   98.8 F (37.1 C)  TempSrc: Oral   Oral  SpO2: 95% (!) 86% 92% 93%    PHYSICAL EXAM: Right upper extremity Neurovascular intact Sensation intact distally Intact pulses distally Incision: dressing C/D/I Compartment soft  LABS  Results for orders placed or performed during the hospital encounter of 08/07/19 (from the past 24 hour(s))  Glucose, capillary     Status: Abnormal   Collection Time: 08/07/19  4:57 PM  Result Value Ref Range   Glucose-Capillary 330 (H) 70 - 99 mg/dL  Hemoglobin A1c     Status: Abnormal   Collection Time: 08/07/19  6:06 PM  Result Value Ref Range   Hgb A1c MFr Bld 6.8 (H) 4.8 - 5.6 %   Mean Plasma Glucose 148.46 mg/dL  Glucose, capillary     Status: Abnormal   Collection Time: 08/07/19  6:07 PM  Result Value Ref Range   Glucose-Capillary 250 (H) 70 - 99 mg/dL  Glucose, capillary     Status: Abnormal   Collection Time: 08/07/19  9:07 PM  Result Value Ref Range   Glucose-Capillary 310 (H) 70 - 99 mg/dL   Comment 1 Notify RN   CBC     Status: None   Collection Time: 08/08/19  5:53 AM  Result Value Ref Range   WBC 9.3 4.0 - 10.5 K/uL   RBC 4.68 4.22 - 5.81 MIL/uL   Hemoglobin 13.1 13.0 - 17.0 g/dL   HCT 41.3 39.0 - 52.0 %   MCV 88.2 80.0 - 100.0 fL   MCH 28.0 26.0 - 34.0 pg   MCHC 31.7 30.0 - 36.0 g/dL    RDW 15.2 11.5 - 15.5 %   Platelets 188 150 - 400 K/uL   nRBC 0.0 0.0 - 0.2 %  Basic metabolic panel     Status: Abnormal   Collection Time: 08/08/19  5:53 AM  Result Value Ref Range   Sodium 141 135 - 145 mmol/L   Potassium 4.2 3.5 - 5.1 mmol/L   Chloride 106 98 - 111 mmol/L   CO2 28 22 - 32 mmol/L   Glucose, Bld 215 (H) 70 - 99 mg/dL   BUN 13 8 - 23 mg/dL   Creatinine, Ser 0.63 0.61 - 1.24 mg/dL   Calcium 8.7 (L) 8.9 - 10.3 mg/dL   GFR calc non Af Amer >60 >60 mL/min   GFR calc Af Amer >60 >60 mL/min   Anion gap 7 5 - 15  Glucose, capillary     Status: Abnormal   Collection Time: 08/08/19  8:16 AM  Result Value Ref Range   Glucose-Capillary 209 (H) 70 - 99 mg/dL  Glucose, capillary     Status: Abnormal   Collection Time: 08/08/19 11:45 AM  Result Value Ref Range   Glucose-Capillary 159 (H) 70 - 99 mg/dL  [name] OR NERVE BLOCK-IMAGE ONLY [facility])  Result Date: 08/07/2019 There is no interpretation for this exam.  This order is for images obtained during a surgical procedure.  Please See "Surgeries" Tab for more information regarding the procedure.    Assessment/Plan: 1 Day Post-Op   Active Problems:   S/P right rotator cuff repair   Oxygen desaturation  Patient is having issues with oxygen desaturation.  I have contacted the hospitalist service to see the patient for this desaturation.  The hospital service has already provided assistance for diabetes management.  Patient's blood sugars are better controlled today.  Patient will continue physical therapy.  The hospitalist will guide work-up for patient's oxygen desaturation.  Patient will benefit from a skilled nursing facility upon discharge as he has a significant fall risk due to issues with balance and deconditioning.  Patient has had knee replacement surgery and has not fully recovered with his lower extremity strength and balance.  This has been exacerbated after his right shoulder rotator cuff surgery.  Continue strict  diabetes management.   Juanell Fairly , MD 08/08/2019, 2:08 PM

## 2019-08-08 NOTE — Progress Notes (Signed)
Physical Therapy Treatment Patient Details Name: Nathan Dean MRN: [id number] DOB: 1955-10-10 Today's Date: 08/08/2019    History of Present Illness Pt is 64 yo male s/p R shoulder arthroscopy with subacromial decompression and distal clavicle excision, biceps tenotomy, and rotator cuff repair. PMH of OSA, HTN, GERD, DM, TKA    PT Comments    Pt alert, in recliner, eager to return to bed. Sit<> stand attempted multiple times, unable to come to fully standing without 3 person assist, and platform walker attachment lowered, and out of the patients way. 2-3 attempts pt was unable to fully straighten his legs and had a controlled descent to recliner. Once in standing, pt was CGA with RW to take several steps to get into the bed. Sit to supine with modA, and instructed in, and assisted with repositioning of R shoulder for protection and comfort. Pt in bed with all needs in reach, current recommendation remains appropriate.        Follow Up Recommendations  SNF     Equipment Recommendations  Other (comment)(platform attachment for pt's bariatric walker)    Recommendations for Other Services       Precautions / Restrictions Precautions Precautions: Fall;Shoulder Shoulder Interventions: Shoulder sling/immobilizer;Shoulder abduction pillow;At all times Required Braces or Orthoses: Sling Restrictions Weight Bearing Restrictions: Yes RUE Weight Bearing: Non weight bearing Other Position/Activity Restrictions: Per MD, prefers pt to be NWB to RUE, but would permit a "light lean" of R elbow on platform walker    Mobility  Bed Mobility Overal bed mobility: Needs Assistance Bed Mobility: Sit to Supine     Supine to sit: HOB elevated;Min assist Sit to supine: Mod assist;+2 for physical assistance   General bed mobility comments: trunk elevation minA, heavy use of bed rails  Transfers Overall transfer level: Needs assistance Equipment used: Rolling walker (2 wheeled) Transfers: Sit  to/from Stand Sit to Stand: +2 safety/equipment;Max assist(maxAx3, heavy stabilization of RW)         General transfer comment: 3+ person assist  Ambulation/Gait   Gait Distance (Feet): 35 Feet Assistive device: Rolling walker (2 wheeled)       General Gait Details: deferred due to pt fatigue and request to return to bed   Stairs             Wheelchair Mobility    Modified Rankin (Stroke Patients Only)       Balance Overall balance assessment: Needs assistance Sitting-balance support: Feet supported Sitting balance-Leahy Scale: Fair       Standing balance-Leahy Scale: Poor Standing balance comment: Pt reliant on RW for static standing                            Cognition Arousal/Alertness: Awake/alert Behavior During Therapy: WFL for tasks assessed/performed Overall Cognitive Status: Within Functional Limits for tasks assessed                                        Exercises Other Exercises Other Exercises: sling adjusted for comfort/optimal wear Other Exercises: Pt ambulated ~67ft with RW, CGA and platform attachment. Endorsed dizziness, legs shaking, controlled sit to recliner (chair follow provided). spO2 on room air 86%, placed on 2L and up to 92%. Other Exercises: Pt unable to stand despite maxAx2 from recliner to attempt further ambulation, returned to room and repositioned for comfort.    General Comments  Pertinent Vitals/Pain Pain Assessment: No/denies pain    Home Living                      Prior Function            PT Goals (current goals can now be found in the care plan section) Progress towards PT goals: Progressing toward goals    Frequency    BID      PT Plan Current plan remains appropriate    Co-evaluation              AM-PAC PT "6 Clicks" Mobility   Outcome Measure  Help needed turning from your back to your side while in a flat bed without using bedrails?: A  Lot Help needed moving from lying on your back to sitting on the side of a flat bed without using bedrails?: A Lot Help needed moving to and from a bed to a chair (including a wheelchair)?: A Lot Help needed standing up from a chair using your arms (e.g., wheelchair or bedside chair)?: A Lot Help needed to walk in hospital room?: A Little Help needed climbing 3-5 steps with a railing? : Total 6 Click Score: 12    End of Session Equipment Utilized During Treatment: Gait belt;Oxygen;Other (comment) Activity Tolerance: Patient tolerated treatment well Patient left: in chair;with family/visitor present;with call bell/phone within reach;with SCD's reapplied Nurse Communication: Mobility status PT Visit Diagnosis: Other abnormalities of gait and mobility (R26.89);Muscle weakness (generalized) (M62.81);Pain Pain - Right/Left: Right Pain - part of body: Shoulder     Time: 9528-4132 PT Time Calculation (min) (ACUTE ONLY): 16 min  Charges:  $Therapeutic Exercise: 8-22 mins                     Lieutenant Diego PT, DPT 2:16 PM,08/08/19

## 2019-08-08 NOTE — NC FL2 (Signed)
[place] MEDICAID FL2 LEVEL OF CARE SCREENING TOOL     IDENTIFICATION  Patient Name: Nathan Dean Birthdate: 1955/12/11 Sex: male Admission Date (Current Location): 08/07/2019  [place] and [place] Number:  Chiropodist and Address:  [facility], [address] [place], [place] [address]      Provider Number: [id number]  Attending Physician Name and Address:  Juanell Fairly, MD  Relative Name and Phone Number:  [phone]    Current Level of Care: Hospital Recommended Level of Care: Skilled Nursing Facility Prior Approval Number:    Date Approved/Denied:   PASRR Number: [id number]  Discharge Plan: SNF    Current Diagnoses: Patient Active Problem List   Diagnosis Date Noted  . Oxygen desaturation 08/08/2019  . S/P right rotator cuff repair 08/07/2019  . Hypoxia 10/22/2018  . Infection of right prepatellar bursa 10/18/2018  . Sepsis (HCC) 10/18/2018  . Chest congestion 07/20/2017  . Viral illness 07/20/2017  . Lower respiratory infection 07/12/2017  . Laryngitis, acute 07/12/2017  . Cough 07/12/2017  . Pleurisy 07/12/2017  . Depression 06/03/2015  . HTN (hypertension) 01/02/2015  . Hyperlipidemia 01/02/2015  . 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 Sleep apnea 09/21/2010  . Arthritis, degenerative 02/17/2009  . Hereditary and idiopathic peripheral neuropathy 11/21/2008  . AD (atopic dermatitis) 07/08/2008  . Acid reflux 04/05/2007    Orientation RESPIRATION BLADDER Height & Weight     Self, Time, Situation, Place  Normal Continent Weight:   Height:     BEHAVIORAL SYMPTOMS/MOOD NEUROLOGICAL BOWEL NUTRITION STATUS      Continent Diet(carb Modified)  AMBULATORY STATUS COMMUNICATION OF NEEDS Skin   Extensive Assist Verbally Surgical wounds                       Personal Care Assistance Level of Assistance  Dressing, Bathing Bathing Assistance: Limited assistance    Dressing Assistance: Limited assistance     Functional Limitations Info             SPECIAL CARE FACTORS FREQUENCY  PT (By licensed PT), OT (By licensed OT)     PT Frequency: 5 times per week OT Frequency: 5 times per week            Contractures Contractures Info: Not present    Additional Factors Info  Allergies   Allergies Info: Penicillins, Succinylcholine Chloride, Keflex Cephalexin, Sulfa Antibiotics           Current Medications (08/08/2019):  This is the current hospital active medication list Current Facility-Administered Medications  Medication Dose Route Frequency Provider Last Rate Last Admin  . acetaminophen (TYLENOL) tablet 325-650 mg  325-650 mg Oral Q6H PRN Juanell Fairly, MD      . acetaminophen (TYLENOL) tablet 500 mg  500 mg Oral Q6H Juanell Fairly, MD   500 mg at 08/08/19 1307  . alum & mag hydroxide-simeth (MAALOX/MYLANTA) 200-200-20 MG/5ML suspension 30 mL  30 mL Oral Q4H PRN Juanell Fairly, MD      . amLODipine (NORVASC) tablet 5 mg  5 mg Oral Daily Juanell Fairly, MD   5 mg at 08/08/19 0915  . aspirin EC tablet 81 mg  81 mg Oral Daily Juanell Fairly, MD   81 mg at 08/08/19 0915  . bisacodyl (DULCOLAX) suppository 10 mg  10 mg Rectal Daily PRN Juanell Fairly, MD      . carvedilol (COREG) tablet 6.25 mg  6.25 mg Oral BID WC  Thornton Park, MD   6.25 mg at 08/08/19 0916  . docusate sodium (COLACE) capsule 100 mg  100 mg Oral BID Thornton Park, MD   100 mg at 08/08/19 0915  . DULoxetine (CYMBALTA) DR capsule 30 mg  30 mg Oral Daily Thornton Park, MD   30 mg at 08/08/19 0916  . enoxaparin (LOVENOX) injection 40 mg  40 mg Subcutaneous Q24H Thornton Park, MD   40 mg at 08/08/19 0981  . furosemide (LASIX) injection 40 mg  40 mg Intravenous Once Shahmehdi, Seyed A, MD      . gabapentin (NEURONTIN) capsule 900 mg  900 mg Oral TID Thornton Park, MD   900 mg at 08/08/19 0915  . hydrALAZINE (APRESOLINE) injection 10 mg  10 mg  Intravenous Q6H PRN Max Sane, MD      . HYDROcodone-acetaminophen (NORCO) 7.5-325 MG per tablet 1-2 tablet  1-2 tablet Oral Q4H PRN Thornton Park, MD   2 tablet at 08/08/19 0930  . HYDROcodone-acetaminophen (NORCO/VICODIN) 5-325 MG per tablet 1-2 tablet  1-2 tablet Oral Q4H PRN Thornton Park, MD      . insulin aspart (novoLOG) injection 0-15 Units  0-15 Units Subcutaneous TID WC Max Sane, MD   3 Units at 08/08/19 1304  . insulin aspart (novoLOG) injection 0-5 Units  0-5 Units Subcutaneous QHS Max Sane, MD   4 Units at 08/07/19 2247  . insulin glargine (LANTUS) injection 85 Units  85 Units Subcutaneous QHS Shahmehdi, Seyed A, MD      . losartan (COZAAR) tablet 100 mg  100 mg Oral Daily Thornton Park, MD   100 mg at 08/08/19 1000  . magnesium citrate solution 1 Bottle  1 Bottle Oral Once PRN Thornton Park, MD      . melatonin tablet 10 mg  10 mg Oral QHS PRN Thornton Park, MD      . menthol-cetylpyridinium (CEPACOL) lozenge 3 mg  1 lozenge Oral PRN Thornton Park, MD       Or  . phenol (CHLORASEPTIC) mouth spray 1 spray  1 spray Mouth/Throat PRN Thornton Park, MD      . metFORMIN (GLUCOPHAGE) tablet 1,000 mg  1,000 mg Oral BID WC Thornton Park, MD   1,000 mg at 08/08/19 0915  . methocarbamol (ROBAXIN) tablet 500 mg  500 mg Oral Q6H PRN Thornton Park, MD       Or  . methocarbamol (ROBAXIN) 500 mg in dextrose 5 % 50 mL IVPB  500 mg Intravenous Q6H PRN Thornton Park, MD      . morphine 2 MG/ML injection 0.5-1 mg  0.5-1 mg Intravenous Q2H PRN Thornton Park, MD      . multivitamin with minerals tablet 1 tablet  1 tablet Oral Daily Thornton Park, MD   1 tablet at 08/08/19 [phone]  . ondansetron (ZOFRAN) tablet 4 mg  4 mg Oral Q6H PRN Thornton Park, MD       Or  . ondansetron [facility]) injection 4 mg  4 mg Intravenous Q6H PRN Thornton Park, MD      . pantoprazole (PROTONIX) EC tablet 40 mg  40 mg Oral Daily Thornton Park, MD   40 mg at 08/08/19 0915  .  polyethylene glycol (MIRALAX / GLYCOLAX) packet 17 g  17 g Oral Daily PRN Thornton Park, MD      . rosuvastatin (CRESTOR) tablet 20 mg  20 mg Oral Daily Thornton Park, MD   20 mg at 08/08/19 0915  . senna (SENOKOT) tablet 8.6 mg  1 tablet Oral  BID Juanell Fairly, MD   8.6 mg at 08/08/19 0915  . traMADol (ULTRAM) tablet 50 mg  50 mg Oral Q6H Juanell Fairly, MD   50 mg at 08/08/19 1303     Discharge Medications: Please see discharge summary for a list of discharge medications.  Relevant Imaging Results:  Relevant Lab Results:   Additional Information SSN [id number]  Barrie Dunker, RN

## 2019-08-08 NOTE — TOC Progression Note (Signed)
Transition of Care [facility]) - Progression Note    Patient Details  Name: Nathan Dean MRN: [id number] Date of Birth: 11/15/1955  Transition of Care [facility]) CM/SW [place], RN Phone Number: 08/08/2019, 10:39 AM  Clinical Narrative:     Met with the patient and his wife, They  Agree for him to go home with [facility] services and [facility] has already called them., I explained to them the issue with insurance covering rehab and it would be out of pocket, they do not want to do that.  I provided them with a phone number to a medical supply store that may be able to order a lift chair to accommodate his height, his wife will call them. I provided them with a list for private pay sitters.  He will get a platform applied to his walker to take home with him.    Expected Discharge Plan: [place] Barriers to Discharge: Continued Medical Work up  Expected Discharge Plan and Services Expected Discharge Plan: [place]   Discharge Planning Services: CM Consult   Living arrangements for the past 2 months: Single Family Home Expected Discharge Date: 08/07/19                         HH Arranged: PT, Nurse's Aide, OT HH Agency: [place] Date [place]: 08/08/19 Time [place]: [phone] Representative spoke with at [place]: [place] ([place]) Interventions    Readmission Risk Interventions Readmission Risk Prevention Plan 10/21/2018  Post Dischage Appt Complete  Medication Screening Complete  Transportation Screening Complete  Some recent data might be hidden

## 2019-08-08 NOTE — Care Management Obs Status (Signed)
MEDICARE OBSERVATION STATUS NOTIFICATION   Patient Details  Name: Nathan Dean MRN: [id number] Date of Birth: 1955-09-30   Medicare Observation Status Notification Given:  Yes    Deakon Frix Clementeen Hoof, RN 08/08/2019, 9:41 AM

## 2019-08-08 NOTE — Evaluation (Signed)
Occupational Therapy Evaluation Patient Details Name: Nathan Dean MRN: [id number] DOB: 1956/02/08 Today's Date: 08/08/2019    History of Present Illness Pt is 64 yo male s/p R shoulder arthroscopy with subacromial decompression and distal clavicle excision, biceps tenotomy, and rotator cuff repair. PMH of OSA, HTN, GERD, DM, TKA   Clinical Impression   Patient is supine in bed upon entry and agreeable to therapy.  Patient presents after R shoulder surgery and is in immobilizer currently (NWB R UE).  Previously, patient was MOD I with ADLs and functional mobility, living with his wife.  Patient presents with several questions regarding DME/AE, surgical precautions and discharge recommendations.  Provided education on all subjects with wife and case manager present. Currently the patient requires MAX A 2-3 for functional transfers and MAX A - TOTAL A for self care tasks.  The patient would benefit from continued occupational therapy services to address numerous performance component deficits outlined below.  Patient and wife inclined to discharge home with [facility] therapies due to insurance not covering other options.  However, based on the extensive level of care needed for patient, recommending SNF and 24/7 SPV assistance.     Follow Up Recommendations  SNF;Supervision/Assistance - 24 hour    Equipment Recommendations  Toilet riser;Other (comment)(platform walker, raised toilet seat, cushion)    Recommendations for Other Services       Precautions / Restrictions Precautions Precautions: Fall;Shoulder Shoulder Interventions: Shoulder sling/immobilizer;Shoulder abduction pillow;At all times Required Braces or Orthoses: Sling Restrictions Weight Bearing Restrictions: Yes RUE Weight Bearing: Non weight bearing Other Position/Activity Restrictions: Per MD, prefers pt to be NWB to RUE, but would permit a "light lean" of R elbow on platform walker      Mobility Bed  Mobility   Transfers Overall transfer level: Needs assistance Equipment used: Rolling walker (2 wheeled) Transfers: Sit to/from Stand Sit to Stand: +2 safety/equipment;Max assist(+2-3)         General transfer comment: 3+ person assist    Balance                            ADL either performed or assessed with clinical judgement   ADL Overall ADL's : Needs assistance/impaired     Grooming: Minimal assistance;Sitting;Cueing for compensatory techniques;Cueing for sequencing;Cueing for UE precautions           Upper Body Dressing : Maximal assistance;Sitting;Adhering to UE precautions;Cueing for safety;Cueing for sequencing;Cueing for compensatory techniques   Lower Body Dressing: Maximal assistance;Bed level;Cueing for compensatory techniques;Cueing for sequencing;Cueing for safety   Toilet Transfer: Maximal assistance;+2 for physical assistance;+2 for safety/equipment   Toileting- Clothing Manipulation and Hygiene: Total assistance         General ADL Comments: Demonstrating significantly decreased ability to safely perform ADLs     Vision Patient Visual Report: No change from baseline       Perception     Praxis      Pertinent Vitals/Pain Pain Assessment: No/denies pain     Hand Dominance Right   Extremity/Trunk Assessment Upper Extremity Assessment Upper Extremity Assessment: RUE deficits/detail RUE Deficits / Details: s/p surgery, in sling at all times RUE: Unable to fully assess due to immobilization RUE Sensation: WNL LUE Deficits / Details: WFLs for transfers/RW use   Lower Extremity Assessment Lower Extremity Assessment: Defer to PT evaluation   Cervical / Trunk Assessment Cervical / Trunk Assessment: Normal   Communication Communication Communication: No difficulties   Cognition Arousal/Alertness: Awake/alert Behavior During  Therapy: WFL for tasks assessed/performed Overall Cognitive Status: Within Functional Limits for  tasks assessed                                     General Comments  Patient with poor understanding of UE precautions and wear time of sling. Will continue to provide education.    Exercises Other Exercises Other Exercises: Provided education on goals and role of OT in acute care Other Exercises: Family meeting with wife and case manager present.  Discussing discharge options within insurance policy and needed DME/AE/AD for safe transition to next level of care Other Exercises: Pt unable to stand despite maxAx2 from recliner to attempt further ambulation, returned to room and repositioned for comfort.   Shoulder Instructions      Home Living Family/patient expects to be discharged to:: Private residence Living Arrangements: Spouse/significant other Available Help at Discharge: Family;Available PRN/intermittently(Wife works during the day) Type of Home: House Home Access: Stairs to enter [employer] of Steps: 3-4 Entrance Stairs-Rails: Right;Left;Can reach both Home Layout: One level     Bathroom Shower/Tub: Chief Strategy Officer: Toilet riser;Walker - 2 wheels;Other (comment);Cane - quad          Prior Functioning/Environment Level of Independence: Independent with assistive device(s)        Comments: Patient reports he was MOD I for BADLs and did not use AD for functional mobility within home.  Increasing amount of falls in the past 2 weeks.  uses tripod cane for community mobility.        OT Problem List: Decreased strength;Decreased range of motion;Decreased activity tolerance;Impaired balance (sitting and/or standing);Decreased safety awareness;Impaired UE functional use;Decreased knowledge of precautions;Decreased knowledge of use of DME or AE      OT Treatment/Interventions: Self-care/ADL training;Therapeutic exercise;Energy conservation;DME and/or AE instruction;Therapeutic activities;Patient/family education    OT  Goals(Current goals can be found in the care plan section) Acute Rehab OT Goals Patient Stated Goal: "I want to get up" OT Goal Formulation: With patient Time For Goal Achievement: 08/22/19 Potential to Achieve Goals: Fair  OT Frequency: Min 2X/week   Barriers to D/C: Inaccessible home environment;Decreased caregiver support  Recommending 24/7 assistance with heavy assistance for transfers/mobility/ADLs.  Wife works and is unable to provide safe level of care.       Co-evaluation              AM-PAC OT "6 Clicks" Daily Activity     Outcome Measure Help from another person eating meals?: A Little Help from another person taking care of personal grooming?: A Little Help from another person toileting, which includes using toliet, bedpan, or urinal?: A Lot Help from another person bathing (including washing, rinsing, drying)?: A Lot Help from another person to put on and taking off regular upper body clothing?: A Lot Help from another person to put on and taking off regular lower body clothing?: A Lot 6 Click Score: 14   End of Session    Activity Tolerance: Patient tolerated treatment well Patient left: in bed;with call bell/phone within reach;with bed alarm set;with family/visitor present;Other (comment)(Case Freight forwarder)  OT Visit Diagnosis: Unsteadiness on feet (R26.81);History of falling (Z91.81);Muscle weakness (generalized) (M62.81)                Time: 3329-5188 OT Time Calculation (min): 40 min Charges:  OT General Charges $OT Visit: 1 Visit OT  Evaluation $OT Eval Moderate Complexity: 1 Mod OT Treatments $Therapeutic Activity: 38-52 mins  Louanne Belton, MS, OTR/L 08/08/19, 3:02 PM

## 2019-08-09 LAB — BASIC METABOLIC PANEL
Anion gap: 5 (ref 5–15)
BUN: 16 mg/dL (ref 8–23)
CO2: 29 mmol/L (ref 22–32)
Calcium: 8.4 mg/dL — ABNORMAL LOW (ref 8.9–10.3)
Chloride: 108 mmol/L (ref 98–111)
Creatinine, Ser: 0.8 mg/dL (ref 0.61–1.24)
GFR calc Af Amer: >60 mL/min (ref >60)
GFR calc non Af Amer: >60 mL/min (ref >60)
Glucose, Bld: 107 mg/dL — ABNORMAL HIGH (ref 70–99)
Potassium: 3.7 mmol/L (ref 3.5–5.1)
Sodium: 142 mmol/L (ref 135–145)

## 2019-08-09 LAB — GLUCOSE, CAPILLARY
Glucose-Capillary: 69 mg/dL — ABNORMAL LOW (ref 70–99)
Glucose-Capillary: 123 mg/dL — ABNORMAL HIGH (ref 70–99)
Glucose-Capillary: 153 mg/dL — ABNORMAL HIGH (ref 70–99)
Glucose-Capillary: 143 mg/dL — ABNORMAL HIGH (ref 70–99)
Glucose-Capillary: 194 mg/dL — ABNORMAL HIGH (ref 70–99)

## 2019-08-09 LAB — CBC
HCT: 42.6 % (ref 39.0–52.0)
Hemoglobin: 13.2 g/dL (ref 13.0–17.0)
MCH: 27.7 pg (ref 26.0–34.0)
MCHC: 31 g/dL (ref 30.0–36.0)
MCV: 89.5 fL (ref 80.0–100.0)
Platelets: 195 10*3/uL (ref 150–400)
RBC: 4.76 MIL/uL (ref 4.22–5.81)
RDW: 15.8 % — ABNORMAL HIGH (ref 11.5–15.5)
WBC: 7.8 10*3/uL (ref 4.0–10.5)
nRBC: 0 % (ref 0.0–0.2)

## 2019-08-09 LAB — SARS CORONAVIRUS 2 (TAT 6-24 HRS): SARS Coronavirus 2: NEGATIVE

## 2019-08-09 MED ORDER — FUROSEMIDE 10 MG/ML IJ SOLN
40.0000 mg | Freq: Once | INTRAMUSCULAR | Status: AC
  Administered 2019-08-09: 40 mg via INTRAVENOUS
  Filled 2019-08-09: qty 4

## 2019-08-09 MED ORDER — ENOXAPARIN SODIUM 40 MG/0.4ML ~~LOC~~ SOLN: 40.0000 mg | SUBCUTANEOUS | 0 refills | Status: DC

## 2019-08-09 MED ORDER — INSULIN GLARGINE 100 UNIT/ML ~~LOC~~ SOLN
80.0000 [IU] | Freq: Every day | SUBCUTANEOUS | Status: DC
  Administered 2019-08-09: 80 [IU] via SUBCUTANEOUS
  Filled 2019-08-09 (×2): qty 0.8

## 2019-08-09 NOTE — Progress Notes (Signed)
Physical Therapy Treatment Patient Details Name: Nathan Dean MRN: [id number] DOB: 23-May-1955 Today's Date: 08/09/2019    History of Present Illness Pt is 64 yo male s/p R shoulder arthroscopy with subacromial decompression and distal clavicle excision, biceps tenotomy, and rotator cuff repair. PMH of OSA, HTN, GERD, DM, TKA    PT Comments    Pt alert, oriented, agreeable to PT with encouragement and reassurance that he did not need to sit up in chair at end of session. Pt on room air, 88-89% down to 86% with exertion and sitting EOB, placed on 2L for all mobility this session. The patient performed bed mobility with minA for trunk elevation, and then for LE management to return to supine. Sit <> stands performed several times (at least 7 reps) with seated rest breaks in between from significantly elevated bed surface, majority of transfers with modAx2. Platform attachment removed to allow pt to transfer without feeling as if he had to lift his arm to avoid the attachment. Pt did endorse dizziness with several attempts with standing, able to sit safely and dizziness resolved, may benefit from orthostatic assessment. Pt returned to bed with all needs in reach, current plan remains appropriate.       Follow Up Recommendations  SNF     Equipment Recommendations  Other (comment)(platform attachment for bariatric walker)    Recommendations for Other Services       Precautions / Restrictions Precautions Precautions: Fall;Shoulder Shoulder Interventions: Shoulder sling/immobilizer;Shoulder abduction pillow;At all times Required Braces or Orthoses: Sling Restrictions Weight Bearing Restrictions: Yes RUE Weight Bearing: Non weight bearing Other Position/Activity Restrictions: Per MD, prefers pt to be NWB to RUE, but would permit a "light lean" of R elbow on platform walker    Mobility  Bed Mobility Overal bed mobility: Needs Assistance Bed Mobility: Sit to Supine     Supine to sit:  HOB elevated;Min assist Sit to supine: Min assist;HOB elevated   General bed mobility comments: extended time needed for pt to reposition in bed, cueing for technique, hand/foot placement  Transfers Overall transfer level: Needs assistance Equipment used: Rolling walker (2 wheeled) Transfers: Sit to/from Stand Sit to Stand: Mod assist;+2 physical assistance;From elevated surface         General transfer comment: significantly elevated surface, performed x7 this session, modAx2.  Ambulation/Gait             General Gait Details: Pt able to to side step toward [facility] with minA.   Stairs             Wheelchair Mobility    Modified Rankin (Stroke Patients Only)       Balance Overall balance assessment: Needs assistance Sitting-balance support: Feet supported Sitting balance-Leahy Scale: Fair       Standing balance-Leahy Scale: Poor                              Cognition Arousal/Alertness: Awake/alert Behavior During Therapy: WFL for tasks assessed/performed Overall Cognitive Status: Within Functional Limits for tasks assessed                                        Exercises Other Exercises Other Exercises: Pt on room air, 88-89% at rest, 86% with exertion, placed on 2L throughout session. Other Exercises: Several sit <> stands performed this session with significantly elevated bed surface, modAx2. Platform  attachment removed, improved transfer ability noted. Pt did endorse dizziness during several trials, able to sit safely and dizziness resolved.    General Comments        Pertinent Vitals/Pain Pain Assessment: No/denies pain    Home Living                      Prior Function            PT Goals (current goals can now be found in the care plan section) Progress towards PT goals: Progressing toward goals    Frequency    BID      PT Plan Current plan remains appropriate    Co-evaluation               AM-PAC PT "6 Clicks" Mobility   Outcome Measure  Help needed turning from your back to your side while in a flat bed without using bedrails?: A Lot Help needed moving from lying on your back to sitting on the side of a flat bed without using bedrails?: A Lot Help needed moving to and from a bed to a chair (including a wheelchair)?: A Lot Help needed standing up from a chair using your arms (e.g., wheelchair or bedside chair)?: A Lot Help needed to walk in hospital room?: A Little Help needed climbing 3-5 steps with a railing? : Total 6 Click Score: 12    End of Session Equipment Utilized During Treatment: Gait belt;Oxygen Activity Tolerance: Patient tolerated treatment well Patient left: in bed;with call bell/phone within reach;with bed alarm set;with SCD's reapplied Nurse Communication: Mobility status PT Visit Diagnosis: Other abnormalities of gait and mobility (R26.89);Muscle weakness (generalized) (M62.81);Pain Pain - Right/Left: Right Pain - part of body: Shoulder     Time: 8657-8469 PT Time Calculation (min) (ACUTE ONLY): 27 min  Charges:  $Therapeutic Exercise: 23-37 mins                     Lieutenant Diego PT, DPT 10:47 AM,08/09/19

## 2019-08-09 NOTE — Plan of Care (Signed)

## 2019-08-09 NOTE — Progress Notes (Signed)
PT Cancellation Note  Patient Details Name: Nathan Dean MRN: [id number] DOB: [birth year]   Cancelled Treatment:    Reason Eval/Treat Not Completed: Other (comment)(Pt reported throat soreness, fatigue, and some SOB, and that he was not up for the PM session of PT. RN placed [place] back on patient, spO2 92%. Pt with all needs in reach.)   Olga Coaster PT, DPT 3:56 PM,08/09/19

## 2019-08-09 NOTE — Progress Notes (Signed)
Subjective:  POD #2 s/p right shoulder mini-open RC repair complicated by post-op oxygen desaturation.   Patient reports right shoulder pain as mild.    Objective:   VITALS:   Vitals:   08/08/19 2324 08/09/19 0755 08/09/19 0755 08/09/19 1553  BP: 134/89 (!) 159/75  (!) 157/95  Pulse: 91 79 80 88  Resp: 17 18  18   Temp: 98.8 F (37.1 C) 98.1 F (36.7 C)  98.6 F (37 C)  TempSrc: Oral Oral  Oral  SpO2: 91% (!) 88% 92% 93%    PHYSICAL EXAM: Right upper extremity: Neurovascular intact Sensation intact distally Intact pulses distally Dorsiflexion/Plantar flexion intact Incision: dressing C/D/I No cellulitis present Compartment soft  LABS  Results for orders placed or performed during the hospital encounter of 08/07/19 (from the past 24 hour(s))  Glucose, capillary     Status: Abnormal   Collection Time: 08/08/19  4:18 PM  Result Value Ref Range   Glucose-Capillary 182 (H) 70 - 99 mg/dL  Glucose, capillary     Status: Abnormal   Collection Time: 08/08/19  9:25 PM  Result Value Ref Range   Glucose-Capillary 229 (H) 70 - 99 mg/dL   Comment 1 Notify RN   CBC     Status: Abnormal   Collection Time: 08/09/19  5:19 AM  Result Value Ref Range   WBC 7.8 4.0 - 10.5 K/uL   RBC 4.76 4.22 - 5.81 MIL/uL   Hemoglobin 13.2 13.0 - 17.0 g/dL   HCT 42.6 39.0 - 52.0 %   MCV 89.5 80.0 - 100.0 fL   MCH 27.7 26.0 - 34.0 pg   MCHC 31.0 30.0 - 36.0 g/dL   RDW 15.8 (H) 11.5 - 15.5 %   Platelets 195 150 - 400 K/uL   nRBC 0.0 0.0 - 0.2 %  Basic metabolic panel     Status: Abnormal   Collection Time: 08/09/19  5:19 AM  Result Value Ref Range   Sodium 142 135 - 145 mmol/L   Potassium 3.7 3.5 - 5.1 mmol/L   Chloride 108 98 - 111 mmol/L   CO2 29 22 - 32 mmol/L   Glucose, Bld 107 (H) 70 - 99 mg/dL   BUN 16 8 - 23 mg/dL   Creatinine, Ser 0.80 0.61 - 1.24 mg/dL   Calcium 8.4 (L) 8.9 - 10.3 mg/dL   GFR calc non Af Amer >60 >60 mL/min   GFR calc Af Amer >60 >60 mL/min   Anion gap 5 5 - 15   Glucose, capillary     Status: Abnormal   Collection Time: 08/09/19  7:56 AM  Result Value Ref Range   Glucose-Capillary 69 (L) 70 - 99 mg/dL   Comment 1 Notify RN   Glucose, capillary     Status: Abnormal   Collection Time: 08/09/19  8:34 AM  Result Value Ref Range   Glucose-Capillary 123 (H) 70 - 99 mg/dL   Comment 1 Notify RN   Glucose, capillary     Status: Abnormal   Collection Time: 08/09/19 11:38 AM  Result Value Ref Range   Glucose-Capillary 153 (H) 70 - 99 mg/dL   Comment 1 Notify RN     DG Chest Port 1 View  Result Date: 08/08/2019 CLINICAL DATA:  Pitting edema, shortness of breath, recent RIGHT shoulder surgery, diabetes mellitus, hypertension EXAM: PORTABLE CHEST 1 VIEW COMPARISON:  Portable exam 1402 hours compared to 10/21/2018 FINDINGS: Normal heart size, mediastinal contours, and pulmonary vascularity. Elevation of RIGHT diaphragm with RIGHT basilar atelectasis.  Remaining lungs clear. No pleural effusion or pneumothorax. Osseous structures unremarkable. IMPRESSION: RIGHT basilar atelectasis. Electronically Signed   By: Ulyses Southward M.D.   On: 08/08/2019 14:32    Assessment/Plan: 2 Days Post-Op   Active Problems:   S/P right rotator cuff repair   Oxygen desaturation  Continue PT/OT.  Continue supplemental O2 as needed to maintain O2 sat above 92%.  Plan for discharge to [facility] tomorrow.    Juanell Fairly , MD 08/09/2019, 4:00 PM

## 2019-08-09 NOTE — TOC Progression Note (Signed)
Transition of Care [facility]) - Progression Note    Patient Details  Name: Nathan Dean MRN: [id number] Date of Birth: [birth year]  Transition of Care [facility]) CM/SW [place], RN Phone Number: 08/09/2019, 9:45 AM  Clinical Narrative:     Met with the patient and his wife at the bed side at their request, They said this hospital stay has been far better than any other he has had in the past.  They stated that they are so pleased with the team of staff members that have all went above and beyond to meet his needs.  I explained that he will be discharged tomorrow to [facility], [place] has requested that he get there early so that he can go ahead and start his therapy.  The patient is in agreement.  Expected Discharge Plan: [place] Barriers to Discharge: Continued Medical Work up  Expected Discharge Plan and Services Expected Discharge Plan: [place]   Discharge Planning Services: CM Consult   Living arrangements for the past 2 months: Single Family Home Expected Discharge Date: 08/07/19                         HH Arranged: PT, Nurse's Aide, OT HH Agency: [place] Date [place]: 08/08/19 Time [place]: [phone] Representative spoke with at [place]: [place] ([place]) Interventions    Readmission Risk Interventions Readmission Risk Prevention Plan 10/21/2018  Post Dischage Appt Complete  Medication Screening Complete  Transportation Screening Complete  Some recent data might be hidden

## 2019-08-09 NOTE — Progress Notes (Signed)
PROGRESS NOTE    Patient: Nathan Dean                            PCP: Ethelda Chick, MD                    DOB: 01/06/1956            DOA: 08/07/2019 [id number]             DOS: 08/09/2019, 10:39 AM   LOS: 1 day   Date of Service: The patient was seen and examined on 08/09/2019  Subjective:   She was seen in the patient was seen and examined this morning, stable, still on supplemental oxygen, and deconditioned.  With minimal exertion patient desats to 80s.    Brief Narrative:    Nathan Dean  is a 64 y.o. male with a known history of DM,sleep apnea underwent shoulder arthroscopy with biceps tenotomy versus tenodesis, subacromial decompression and distal clavicle excision for rightshoulder SLAPtear, subacromialImpingement andacromioclavicular jointarthrosis by Dr Martha Clan. Medical c/s requested for DM mgmt.   Assessment & Plan:   Active Problems:   S/P right rotator cuff repair   Oxygen desaturation   Acute respiratory distress : -Likely due to deconditioning, generalized weakness, -Per documentation, and nursing staff patient desats with exertion on room air as low as 88%, Patient continues to need minimal 2 L of oxygen to maintain O2 sat at 92%  -Monitor very closely, continue O2 supplements to maintain O2 sat greater than 92% -IV fluid has been DC'd, yesterday -Lasix 40 mg yesterday and today will be given, monitoring I's and O's Daily weight No signs of overt volume overload, or CHF May benefit from a small dose of diuretics -Chest x-ray was reviewed -right basilar atelectasis was noted -RT will be consulted, as needed DuoNeb bronchodilator treatments  diabetes mellitus type 2, uncontrolled, hyperglycemia -Lantus was 80 units nightly we will increase that to 85  Units -CBG: 182, 229, 69 this morning, currently 123 -Continue with SSI, check in CBG before every meal at bedtime Home medication of Metformin, One Touch insulin -on  hold   Hypertension -We will continue current medication, no titration needed Continue Cozaar, Coreg Norvasc on hold  Hyperlipidemia -Statins no changes  Right shoulder arthropathy -Status post arthroscopy, rotator cuff repair -Currently in the sling, -Pain management PT per Ortho recommendation  Depression Continue Cymbalta    Nutritional status:         Cultures;  Antimicrobials:  DVT prophylaxis: SCD/Compression stockings and Lovenox SQ Code Status:   Code Status: Full Code Family Communication: No family member present at bedside- attempt will be made to update daily The above findings and plan of care has been discussed with patientin detail,  they expressed understanding and agreement of above. Disposition Plan:   Anticipated 1-2 days SNF Will obtain SARS-CoV-2 screen today 08/09/2019 for placement  Admission status:  NPATIEN -     Procedures:   SHOULDER ARTHROSCOPY WITH SUBACROMIAL DECOMPRESSION AND DISTAL CLAVICLE EXCISION     Antimicrobials:  Anti-infectives (From admission, onward)   Start     Dose/Rate Route Frequency Ordered Stop   08/07/19 1800  clindamycin (CLEOCIN) IVPB 600 mg     600 mg 100 mL/hr over 30 Minutes Intravenous Every 6 hours 08/07/19 1759 08/08/19 0136   08/07/19 0629  clindamycin (CLEOCIN) 900 MG/50ML IVPB    Note to Pharmacy: Renie Ora   : cabinet override  08/07/19 0629 08/07/19 0810   08/07/19 0615  clindamycin (CLEOCIN) IVPB 900 mg     900 mg 100 mL/hr over 30 Minutes Intravenous On call to O.R. 08/07/19 0160 08/07/19 0818       Medication:  . amLODipine  5 mg Oral Daily  . aspirin EC  81 mg Oral Daily  . carvedilol  6.25 mg Oral BID WC  . docusate sodium  100 mg Oral BID  . DULoxetine  30 mg Oral Daily  . enoxaparin (LOVENOX) injection  40 mg Subcutaneous Q24H  . gabapentin  900 mg Oral TID  . insulin aspart  0-15 Units Subcutaneous TID WC  . insulin aspart  0-5 Units Subcutaneous QHS  . insulin  glargine  85 Units Subcutaneous QHS  . losartan  100 mg Oral Daily  . metFORMIN  1,000 mg Oral BID WC  . multivitamin with minerals  1 tablet Oral Daily  . pantoprazole  40 mg Oral Daily  . rosuvastatin  20 mg Oral Daily  . senna  1 tablet Oral BID  . traMADol  50 mg Oral Q6H    acetaminophen, alum & mag hydroxide-simeth, bisacodyl, hydrALAZINE, HYDROcodone-acetaminophen, HYDROcodone-acetaminophen, magnesium citrate, melatonin, menthol-cetylpyridinium **OR** phenol, methocarbamol **OR** methocarbamol (ROBAXIN) IV, morphine injection, ondansetron **OR** ondansetron (ZOFRAN) IV, polyethylene glycol   Objective:   Vitals:   08/08/19 1617 08/08/19 2324 08/09/19 0755 08/09/19 0755  BP: 130/76 134/89 (!) 159/75   Pulse: 87 91 79 80  Resp: 20 17 18    Temp: 98.9 F (37.2 C) 98.8 F (37.1 C) 98.1 F (36.7 C)   TempSrc: Oral Oral Oral   SpO2: (!) 88% 91% (!) 88% 92%    Intake/Output Summary (Last 24 hours) at 08/09/2019 1039 Last data filed at 08/09/2019 1000 Gross per 24 hour  Intake --  Output 2175 ml  Net -2175 ml   There were no vitals filed for this visit.   Examination:   BP (!) 159/75 (BP Location: Left Arm)   Pulse 80   Temp 98.1 F (36.7 C) (Oral)   Resp 18   SpO2 92%    Physical Exam  Constitution:  Alert, cooperative, no distress,  Psychiatric: Normal and stable mood and affect, cognition intact,   HEENT: Normocephalic, PERRL, otherwise with in Normal limits  Chest:Chest symmetric Cardio vascular:  S1/S2, RRR, No murmure, No Rubs or Gallops  pulmonary: Clear to auscultation bilaterally, respirations unlabored, negative wheezes / crackles Abdomen: Soft, non-tender, non-distended, bowel sounds,no masses, no organomegaly Muscular skeletal: Limited exam - in bed, able to move all 4 extremities, Normal strength,  Neuro: CNII-XII intact. , normal motor and sensation, reflexes intact  Extremities: No pitting edema lower extremities, +2 pulses, shoulder sling in place,  immobile Skin: Dry, warm to touch, negative for any Rashes, No open wounds Wounds: Shoulder surgical wound, dressing in place, sling in place      LABs:  CBC Latest Ref Rng & Units 08/09/2019 08/08/2019 08/03/2019  WBC 4.0 - 10.5 K/uL 7.8 9.3 7.5  Hemoglobin 13.0 - 17.0 g/dL 13.2 13.1 14.7  Hematocrit 39.0 - 52.0 % 42.6 41.3 47.4  Platelets 150 - 400 K/uL 195 188 234   CMP Latest Ref Rng & Units 08/09/2019 08/08/2019 08/03/2019  Glucose 70 - 99 mg/dL 107(H) 215(H) 215(H)  BUN 8 - 23 mg/dL 16 13 30(H)  Creatinine 0.61 - 1.24 mg/dL 0.80 0.63 0.86  Sodium 135 - 145 mmol/L 142 141 141  Potassium 3.5 - 5.1 mmol/L 3.7 4.2 3.9  Chloride  98 - 111 mmol/L 108 106 103  CO2 22 - 32 mmol/L 29 28 29   Calcium 8.9 - 10.3 mg/dL ) 6.2(X) 9.3  Total Protein 6.5 - 8.1 g/dL - - -  Total Bilirubin 0.3 - 1.2 mg/dL - - -  Alkaline Phos 38 - 126 U/L - - -  AST 15 - 41 U/L - - -  ALT 0 - 44 U/L - - -    SIGNED: 5.2(W, MD, FACP, FHM. [facility],  Pager [phone]  If 7PM-7AM, please contact night-coverage [email or web address] [facility] 08/09/2019, 10:39 AM

## 2019-08-09 NOTE — Discharge Summary (Addendum)
Physician Discharge Summary  Patient ID: Nathan Dean MRN: [id number] DOB/AGE: 11-14-1955 64 y.o.  Admit date: 08/07/2019 Discharge date: 08/10/2019  Admission Diagnoses:  Right Shoulder SLAP Tear and Impingement and AC Joint Arthrosis <principal problem not specified>  Discharge Diagnoses:  Right Shoulder mini-open rotator cuff repair Active Problems:   S/P right rotator cuff repair   Oxygen desaturation/acute respiratory distress   Past Medical History:  Diagnosis Date  . 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 Diabetes ([place])   . GERD (gastroesophageal reflux disease)   . Hyperlipidemia   . Hypertension   . Sleep apnea     Surgeries: Procedure(s): RIGHT SHOULDER ARTHROSCOPY WITH SUBACROMIAL DECOMPRESSION AND DISTAL CLAVICLE EXCISION and Biceps Tenotomy, mini-open ROTATOR CUFF REPAIR on 08/07/2019   Consultants (if any):   Discharged Condition: Improved  Hospital Course: Nathan Dean is an 64 y.o. male who was admitted 08/07/2019 after undergoing an uncomplicated mini-open rotator cuff repair.  Patient was admitted for oxygen desaturation in PACU.  Patient was admitted to the orthopaedic floor.  The hosptialist service was consulted for assistance in managing the patient's diabetes and oxygen desaturation. His blood sugars initially postop were over 300. 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 He has lower extremity weakness which makes it difficult for him to stand independently. Patient worked with physical therapy/Occupational Therapy while an inpatient here at [facility].  He used a Landscape architect while getting up keeping his right arm, which was in a sling on the platform.  For his oxygen desaturations, an xray was ordered which showed atelectasis but no CHF or pneumonia.  He was given supplemental O2.  He was given lasix.  He received respiratory therapy and DuoNebs prn.    Diabetes  was controled on a sliding scale in addition to his baseline medications.  Given his clinical improvement he was prepared for discharge on postop day #3.  He was given perioperative antibiotics:  Anti-infectives (From admission, onward)   Start     Dose/Rate Route Frequency Ordered Stop   08/07/19 1800  clindamycin (CLEOCIN) IVPB 600 mg     600 mg 100 mL/hr over 30 Minutes Intravenous Every 6 hours 08/07/19 1759 08/08/19 0136   08/07/19 0629  clindamycin (CLEOCIN) 900 MG/50ML IVPB    Note to Pharmacy: Milinda Cave   : cabinet override      08/07/19 0629 08/07/19 0810   08/07/19 0615  clindamycin (CLEOCIN) IVPB 900 mg     900 mg 100 mL/hr over 30 Minutes Intravenous On call to O.R. 08/07/19 3016 08/07/19 0818    .  He was given sequential compression devices, early ambulation, and lovenox for DVT prophylaxis.  He benefited maximally from the hospital stay and there were no complications.    Recent vital signs:  Vitals:   08/09/19 0755 08/09/19 0755  BP: (!) 159/75   Pulse: 79 80  Resp: 18   Temp: 98.1 F (36.7 C)   SpO2: (!) 88% 92%    Recent laboratory studies:  Lab Results  Component Value Date   HGB 13.2 08/09/2019   HGB 13.1 08/08/2019   HGB 14.7 08/03/2019   Lab Results  Component Value Date   WBC 7.8 08/09/2019   PLT 195 08/09/2019   Lab Results  Component Value Date   INR 1.0 08/03/2019   Lab Results  Component Value Date   NA 142 08/09/2019   K 3.7 08/09/2019   CL 108 08/09/2019   CO2  29 08/09/2019   BUN 16 08/09/2019   CREATININE 0.80 08/09/2019   GLUCOSE 107 (H) 08/09/2019    Discharge Medications:   Allergies as of 08/09/2019      Reactions   Penicillins Anaphylaxis, Rash, Other (See Comments)   Succinylcholine Chloride Anaphylaxis, Rash   Keflex [cephalexin] Rash   Sulfa Antibiotics Rash, Other (See Comments)      Medication List    STOP taking these medications   buPROPion 150 MG 24 hr tablet Commonly known as: WELLBUTRIN XL    hydrochlorothiazide 12.5 MG tablet Commonly known as: HYDRODIURIL   HYDROcodone-acetaminophen 5-325 MG tablet Commonly known as: NORCO/VICODIN        TAKE these medications   amLODipine 10 MG tablet Commonly known as: NORVASC TAKE 0.5 TABLETS (5 MG TOTAL) BY MOUTH DAILY.   aspirin 81 MG tablet Take 1 tablet (81 mg total) by mouth daily.   Basaglar KwikPen 100 UNIT/ML Inject 80 Units into the skin daily.   Bydureon 2 MG Pen Generic drug: Exenatide ER Inject 2 mg into the skin once a week.   carvedilol 6.25 MG tablet Commonly known as: COREG TAKE 1 TABLET BY MOUTH TWICE A DAY WITH FOOD   DULoxetine 30 MG capsule Commonly known as: CYMBALTA Take 30 mg by mouth daily.  LOVENOX 40 mg Subcutaneous injection daily x 2 weeks post-op.   gabapentin 300 MG capsule Commonly known as: NEURONTIN Take 3 capsules (900 mg total) by mouth 3 (three) times daily.   glucose blood test strip 1 each by Other route 4 (four) times daily. Use with One touch meter to check blood sugar. Dx E11.9   Insulin Pen Needle 32G X 4 MM Misc Commonly known as: BD Pen Needle Nano U/F Inject 1 each as directed 3 (three) times daily.   losartan 100 MG tablet Commonly known as: COZAAR Take 1 tablet (100 mg total) by mouth daily.   Melatonin 10 MG Tabs Take 10 mg by mouth at bedtime as needed (sleep).   metFORMIN 500 MG tablet Commonly known as: GLUCOPHAGE Take 1,000 mg by mouth 2 (two) times daily with a meal.   multivitamin with minerals Tabs tablet Take 1 tablet by mouth daily.   omeprazole 20 MG capsule Commonly known as: PRILOSEC Take 20 mg by mouth daily.   ondansetron 4 MG tablet Commonly known as: Zofran Take 1 tablet (4 mg total) by mouth every 8 (eight) hours as needed for nausea or vomiting.   ONE TOUCH LANCETS Misc 1 each by Does not apply route 4 (four) times daily. Use with one touch meter to check blood sugar. Dx: E11.9   ONE TOUCH ULTRA SYSTEM KIT w/Device Kit 1 kit by  Does not apply route once. Dx. E11.9   oxyCODONE 5 MG immediate release tablet Commonly known as: Oxy IR/ROXICODONE Take 1 tablet (5 mg total) by mouth every 4 (four) hours as needed.   rosuvastatin 20 MG tablet Commonly known as: CRESTOR Take 20 mg by mouth daily.            Durable Medical Equipment  (From admission, onward)         Start     Ordered   08/07/19 1119  For home use only DME Walker platform  Once    Question:  Patient needs a walker to treat with the following condition  Answer:  Lower extremity weakness   08/07/19 1121          Diagnostic Studies: [facility] 1 [address]  Result Date: 08/08/2019 CLINICAL DATA:  Pitting edema, shortness of breath, recent RIGHT shoulder surgery, diabetes mellitus, hypertension EXAM: PORTABLE CHEST 1 VIEW COMPARISON:  Portable exam 1402 hours compared to 10/21/2018 FINDINGS: Normal heart size, mediastinal contours, and pulmonary vascularity. Elevation of RIGHT diaphragm with RIGHT basilar atelectasis. Remaining lungs clear. No pleural effusion or pneumothorax. Osseous structures unremarkable. IMPRESSION: RIGHT basilar atelectasis. Electronically Signed   By: Lavonia Dana M.D.   On: 08/08/2019 14:32   [name] OR NERVE BLOCK-IMAGE ONLY [facility])  Result Date: 08/07/2019 There is no interpretation for this exam.  This order is for images obtained during a surgical procedure.  Please See "Surgeries" Tab for more information regarding the procedure.    Disposition: Discharge disposition: 01-Home or Self Care       Discharge Instructions    Call MD / Call 911   Complete by: As directed    If you experience chest pain or shortness of breath, CALL 911 and be transported to the hospital emergency room.  If you develope a fever above 101 F, pus (white drainage) or increased drainage or redness at the wound, or calf pain, call your surgeon's office.   Call MD / Call 911   Complete by: As directed    If you experience chest pain or shortness  of breath, CALL 911 and be transported to the hospital emergency room.  If you develope a fever above 101 F, pus (white drainage) or increased drainage or redness at the wound, or calf pain, call your surgeon's office.   Constipation Prevention   Complete by: As directed    Drink plenty of fluids.  Prune juice may be helpful.  You may use a stool softener, such as Colace (over the counter) 100 mg twice a day.  Use MiraLax (over the counter) for constipation as needed.   Constipation Prevention   Complete by: As directed    Drink plenty of fluids.  Prune juice may be helpful.  You may use a stool softener, such as Colace (over the counter) 100 mg twice a day.  Use MiraLax (over the counter) for constipation as needed.   Diet - low sodium heart healthy   Complete by: As directed    Diet - low sodium heart healthy   Complete by: As directed    Discharge instructions   Complete by: As directed    Wear sling at all times, including sleep.  You will need to use the sling for a total of 4 weeks following surgery.  Do not try and lift your arm up or away from your body for any reason.   Keep the dressing dry.  You may remove bandage in 3 days.  Leave the Steri-Strips (white medical tape) in place.  You may place additional Band-Aids over top of the Steri-Strips if you wish.  May shower once dressing is removed in 3 days.  Remove sling carefully only for showers, leaving arm down by your side while in the shower.  Make sure to take some pain medication this evening before you fall asleep, in preparation for the nerve block wearing off in the middle of the night.  If the the pain medication causes itching try taking Benadryl.  You may be most comfortable sleeping in a recliner.  If you do sleep in near bed, placed pillows behind the shoulder that have the operation to support it.   Discharge instructions   Complete by: As directed    Patient should continue wearing his abduction  sling on the right  arm at all times. Patient should use a platform walker for assistance with ambulation. Patient should place as little weight as possible through the right upper extremity while using the walker. Patient should avoid active forward elevation or abduction of the right shoulder to protect his rotator cuff repair. Patient should continue physical therapy for lower extremity strengthening, gait training and balance. He should continue to receive occupational therapy for assistance with ADLs. Patient's dressing should remain in place until Saturday, 08/11/2019. His bandage may be removed after that and Steri-Strips should be left in place. Patient may shower after the bandage has been removed on 08/11/2019. Prior to that he should sponge bathe.   Driving restrictions   Complete by: As directed    No driving until follow up in the office with Dr. Mack Guise   Driving restrictions   Complete by: As directed    No driving for 4 weeks   Increase activity slowly as tolerated   Complete by: As directed    Increase activity slowly as tolerated   Complete by: As directed    Lifting restrictions   Complete by: As directed    No lifting for 12-16 weeks   Lifting restrictions   Complete by: As directed    No lifting for 12-16 weeks       Contact information for follow-up providers    Thornton Park, MD Follow up.   Specialty: Orthopedic Surgery Why: August 15, 2019 @ 11:30 am  Contact information: [place]  [address] [phone]            Contact information for after-discharge care    Destination    [facility] .   Service: Skilled Nursing Contact information: [address] [place] [phone]                   Signed: Thornton Park ,MD 08/09/2019, 3:21 PM

## 2019-08-10 DIAGNOSIS — M6281 Muscle weakness (generalized): Secondary | ICD-10-CM | POA: Insufficient documentation

## 2019-08-10 DIAGNOSIS — R4189 Other symptoms and signs involving cognitive functions and awareness: Secondary | ICD-10-CM | POA: Insufficient documentation

## 2019-08-10 DIAGNOSIS — G9340 Encephalopathy, unspecified: Secondary | ICD-10-CM

## 2019-08-10 DIAGNOSIS — F329 Major depressive disorder, single episode, unspecified: Secondary | ICD-10-CM

## 2019-08-10 DIAGNOSIS — R278 Other lack of coordination: Secondary | ICD-10-CM | POA: Insufficient documentation

## 2019-08-10 DIAGNOSIS — Z741 Need for assistance with personal care: Secondary | ICD-10-CM

## 2019-08-10 DIAGNOSIS — R2681 Unsteadiness on feet: Secondary | ICD-10-CM | POA: Insufficient documentation

## 2019-08-10 DIAGNOSIS — S43431A Superior glenoid labrum lesion of right shoulder, initial encounter: Secondary | ICD-10-CM | POA: Insufficient documentation

## 2019-08-10 DIAGNOSIS — R262 Difficulty in walking, not elsewhere classified: Secondary | ICD-10-CM | POA: Insufficient documentation

## 2019-08-10 HISTORY — DX: Other symptoms and signs involving cognitive functions and awareness: R41.89

## 2019-08-10 HISTORY — DX: Unsteadiness on feet: R26.81

## 2019-08-10 HISTORY — DX: Need for assistance with personal care: Z74.1

## 2019-08-10 HISTORY — DX: Superior glenoid labrum lesion of right shoulder, initial encounter: S43.431A

## 2019-08-10 HISTORY — DX: Other lack of coordination: R27.8

## 2019-08-10 HISTORY — DX: Encephalopathy, unspecified: G93.40

## 2019-08-10 HISTORY — DX: Major depressive disorder, single episode, unspecified: F32.9

## 2019-08-10 HISTORY — DX: Muscle weakness (generalized): M62.81

## 2019-08-10 HISTORY — DX: Difficulty in walking, not elsewhere classified: R26.2

## 2019-08-10 LAB — CBC
HCT: 45.4 % (ref 39.0–52.0)
Hemoglobin: 14.2 g/dL (ref 13.0–17.0)
MCH: 27.7 pg (ref 26.0–34.0)
MCHC: 31.3 g/dL (ref 30.0–36.0)
MCV: 88.7 fL (ref 80.0–100.0)
Platelets: 245 10*3/uL (ref 150–400)
RBC: 5.12 MIL/uL (ref 4.22–5.81)
RDW: 15.4 % (ref 11.5–15.5)
WBC: 8.9 10*3/uL (ref 4.0–10.5)
nRBC: 0 % (ref 0.0–0.2)

## 2019-08-10 LAB — GLUCOSE, CAPILLARY: Glucose-Capillary: 177 mg/dL — ABNORMAL HIGH (ref 70–99)

## 2019-08-10 NOTE — TOC Transition Note (Signed)
Transition of Care [facility]) - CM/SW Discharge Note   Patient Details  Name: Nathan Dean MRN: [id number] Date of Birth: Aug 15, 1955  Transition of Care [facility]) CM/SW Contact:  Barrie Dunker, RN Phone Number: 08/10/2019, 9:02 AM   Clinical Narrative:    Report was called to [facility] for the patient to go to room 316 Bedside nurse to remove the IV, DC packet on the chart, The patient is aware and so is the wife Nathan Dean,  EMS has been called and he is the first on the list   Final next level of care: Skilled Nursing Facility Barriers to Discharge: Barriers Resolved   Patient Goals and CMS Choice Patient states their goals for this hospitalization and ongoing recovery are:: get stronger      Discharge Placement              Patient chooses bed at: [facility] Patient to be transferred to facility by: EMS Name of family member notified: Nathan Dean Patient and family notified of of transfer: 08/10/19  Discharge Plan and Services   Discharge Planning Services: CM Consult                      HH Arranged: PT, Nurse's Aide, OT [facility] Agency: [facility] Date [facility] Agency Contacted: 08/08/19 Time HH Agency Contacted: [phone] Representative spoke with at [facility] Agency: Barbara Cower  Social Determinants of Health (SDOH) Interventions     Readmission Risk Interventions Readmission Risk Prevention Plan 10/21/2018  Post Dischage Appt Complete  Medication Screening Complete  Transportation Screening Complete  Some recent data might be hidden

## 2019-08-10 NOTE — Plan of Care (Signed)
  Problem: Education: Goal: Knowledge of General Education information will improve Description: Including pain rating scale, medication(s)/side effects and non-pharmacologic comfort measures 08/10/2019 1100 by Garwin Brothers, RN Outcome: Adequate for Discharge 08/10/2019 1100 by Garwin Brothers, RN Outcome: Adequate for Discharge   Problem: Health Behavior/Discharge Planning: Goal: Ability to manage health-related needs will improve 08/10/2019 1100 by Garwin Brothers, RN Outcome: Adequate for Discharge 08/10/2019 1100 by Garwin Brothers, RN Outcome: Adequate for Discharge   Problem: Clinical Measurements: Goal: Ability to maintain clinical measurements within normal limits will improve 08/10/2019 1100 by Garwin Brothers, RN Outcome: Adequate for Discharge 08/10/2019 1100 by Garwin Brothers, RN Outcome: Adequate for Discharge Goal: Will remain free from infection 08/10/2019 1100 by Garwin Brothers, RN Outcome: Adequate for Discharge 08/10/2019 1100 by Garwin Brothers, RN Outcome: Adequate for Discharge Goal: Diagnostic test results will improve 08/10/2019 1100 by Garwin Brothers, RN Outcome: Adequate for Discharge 08/10/2019 1100 by Garwin Brothers, RN Outcome: Adequate for Discharge Goal: Respiratory complications will improve 08/10/2019 1100 by Garwin Brothers, RN Outcome: Adequate for Discharge 08/10/2019 1100 by Garwin Brothers, RN Outcome: Adequate for Discharge Goal: Cardiovascular complication will be avoided 08/10/2019 1100 by Garwin Brothers, RN Outcome: Adequate for Discharge 08/10/2019 1100 by Garwin Brothers, RN Outcome: Adequate for Discharge   Problem: Activity: Goal: Risk for activity intolerance will decrease 08/10/2019 1100 by Garwin Brothers, RN Outcome: Adequate for Discharge 08/10/2019 1100 by Garwin Brothers, RN Outcome: Adequate for Discharge   Problem: Nutrition: Goal: Adequate nutrition will be  maintained 08/10/2019 1100 by Garwin Brothers, RN Outcome: Adequate for Discharge 08/10/2019 1100 by Garwin Brothers, RN Outcome: Adequate for Discharge   Problem: Coping: Goal: Level of anxiety will decrease 08/10/2019 1100 by Garwin Brothers, RN Outcome: Adequate for Discharge 08/10/2019 1100 by Garwin Brothers, RN Outcome: Adequate for Discharge   Problem: Elimination: Goal: Will not experience complications related to bowel motility 08/10/2019 1100 by Garwin Brothers, RN Outcome: Adequate for Discharge 08/10/2019 1100 by Garwin Brothers, RN Outcome: Adequate for Discharge Goal: Will not experience complications related to urinary retention 08/10/2019 1100 by Garwin Brothers, RN Outcome: Adequate for Discharge 08/10/2019 1100 by Garwin Brothers, RN Outcome: Adequate for Discharge   Problem: Pain Managment: Goal: General experience of comfort will improve 08/10/2019 1100 by Garwin Brothers, RN Outcome: Adequate for Discharge 08/10/2019 1100 by Garwin Brothers, RN Outcome: Adequate for Discharge   Problem: Safety: Goal: Ability to remain free from injury will improve 08/10/2019 1100 by Garwin Brothers, RN Outcome: Adequate for Discharge 08/10/2019 1100 by Garwin Brothers, RN Outcome: Adequate for Discharge   Problem: Skin Integrity: Goal: Risk for impaired skin integrity will decrease 08/10/2019 1100 by Garwin Brothers, RN Outcome: Adequate for Discharge 08/10/2019 1100 by Garwin Brothers, RN Outcome: Adequate for Discharge

## 2019-08-10 NOTE — Progress Notes (Signed)
AVS added to chart by SWOT RN; report called to [facility] by [facility] RN

## 2019-08-10 NOTE — Progress Notes (Signed)
EMS present for pt discharge; discharge packet given to EMS personnel to take to [facility] facility; pt transferred from chair to EMS stretcher x 3 person max assist with gait belt on 2L O2 [place], O2 transferred to portable tank; pt discharged via stretcher to [facility]

## 2019-08-10 NOTE — Progress Notes (Signed)
PROGRESS NOTE    Patient: Nathan Dean                            PCP: Wardell Honour, MD                    DOB: 03-Apr-1956            DOA: 08/07/2019 MPN:[id number]             DOS: 08/10/2019, 10:31 AM   LOS: 2 days   Date of Service: The patient was seen and examined on 08/10/2019  Subjective:   The patient was seen and examined, stable blood sugar has improved,  No complaints, no issues overnight.  Sitting up in chair, awake alert oriented not complaining of chest pain or shortness of breath.  cleared for discharge to SNF today.    Brief Narrative:    Gabriel Conry  is a 64 y.o. male with a known history of DM,sleep apnea underwent shoulder arthroscopy with biceps tenotomy versus tenodesis, subacromial decompression and distal clavicle excision for rightshoulder SLAPtear, subacromialImpingement andacromioclavicular jointarthrosis by Dr Mack Guise. Medical c/s requested for DM mgmt.   Assessment & Plan:   Active Problems:   S/P right rotator cuff repair   Oxygen desaturation   Acute respiratory distress : -Continues to improve, -Per documentation, and nursing staff patient desats with exertion on room air as low as 88%, Patient continues to need minimal 2 L of oxygen to maintain O2 sat at 92%  -Monitor very closely, continue O2 supplements to maintain O2 sat greater than 92% -IV fluid has been DC'd, yesterday -Lasix 40 mg yesterday and today will be given, monitoring I's and O's Daily weight No signs of overt volume overload, or CHF May benefit from a small dose of diuretics -Chest x-ray was reviewed -right basilar atelectasis was noted -RT will be consulted, as needed DuoNeb bronchodilator treatments  diabetes mellitus type 2, uncontrolled, hyperglycemia -Lantus was 80 units nightly-dose will need to be titrated down as patient is blood sugars improving -CBG: 153, 143, 194, 177 this Am  -Continue with SSI, check in CBG before every meal at bedtime May  resume Metformin, ISS high with One Touch insulin   Hypertension -Blood pressure remained stable - Continue Cozaar, Coreg Norvasc DC'd  Hyperlipidemia -Statins no changes  Right shoulder arthropathy -Status post arthroscopy, rotator cuff repair -Currently in the sling, -Pain management PT per Ortho recommendation  Depression Continue Cymbalta    Nutritional status:         Cultures;  Antimicrobials:  DVT prophylaxis: SCD/Compression stockings and Lovenox SQ Code Status:   Code Status: Full Code Family Communication: No family member present at bedside- attempt will be made to update daily The above findings and plan of care has been discussed with patientin detail,  they expressed understanding and agreement of above. Disposition Plan: Anticipated discharge to SNF today, cleared from an medical management Will obtain SARS-CoV-2 screen today 08/09/2019 negative Admission status:  NPATIEN -     Procedures:   SHOULDER ARTHROSCOPY WITH SUBACROMIAL DECOMPRESSION AND DISTAL CLAVICLE EXCISION     Antimicrobials:  Anti-infectives (From admission, onward)   Start     Dose/Rate Route Frequency Ordered Stop   08/07/19 1800  clindamycin (CLEOCIN) IVPB 600 mg     600 mg 100 mL/hr over 30 Minutes Intravenous Every 6 hours 08/07/19 1759 08/08/19 0136   08/07/19 0629  clindamycin (CLEOCIN) 900 MG/50ML IVPB  Note to Pharmacy: Renie Ora   : cabinet override      08/07/19 0629 08/07/19 0810   08/07/19 0615  clindamycin (CLEOCIN) IVPB 900 mg     900 mg 100 mL/hr over 30 Minutes Intravenous On call to O.R. 08/07/19 6010 08/07/19 0818       Medication:  . amLODipine  5 mg Oral Daily  . aspirin EC  81 mg Oral Daily  . carvedilol  6.25 mg Oral BID WC  . docusate sodium  100 mg Oral BID  . DULoxetine  30 mg Oral Daily  . enoxaparin (LOVENOX) injection  40 mg Subcutaneous Q24H  . gabapentin  900 mg Oral TID  . insulin aspart  0-15 Units Subcutaneous TID WC  .  insulin aspart  0-5 Units Subcutaneous QHS  . insulin glargine  80 Units Subcutaneous QHS  . losartan  100 mg Oral Daily  . metFORMIN  1,000 mg Oral BID WC  . multivitamin with minerals  1 tablet Oral Daily  . pantoprazole  40 mg Oral Daily  . rosuvastatin  20 mg Oral Daily  . senna  1 tablet Oral BID  . traMADol  50 mg Oral Q6H    acetaminophen, alum & mag hydroxide-simeth, bisacodyl, hydrALAZINE, HYDROcodone-acetaminophen, HYDROcodone-acetaminophen, magnesium citrate, melatonin, menthol-cetylpyridinium **OR** phenol, methocarbamol **OR** methocarbamol (ROBAXIN) IV, morphine injection, ondansetron **OR** ondansetron (ZOFRAN) IV, polyethylene glycol   Objective:   Vitals:   08/09/19 2345 08/09/19 2355 08/10/19 0736 08/10/19 1014  BP: (!) 144/78 135/78 138/90 134/89  Pulse: 86 87 87 90  Resp: 18 18 18  (!) 22  Temp: 98.2 F (36.8 C) 98.4 F (36.9 C) 98 F (36.7 C)   TempSrc: Oral Oral Oral   SpO2: 95% 96% 95% 96%    Intake/Output Summary (Last 24 hours) at 08/10/2019 1031 Last data filed at 08/09/2019 1449 Gross per 24 hour  Intake --  Output 900 ml  Net -900 ml   There were no vitals filed for this visit.   Examination:  BP 134/89 (BP Location: Left Arm)   Pulse 90   Temp 98 F (36.7 C) (Oral)   Resp (!) 22   SpO2 96%    Physical Exam  Constitution:  Alert, cooperative, no distress,  Psychiatric: Normal and stable mood and affect, cognition intact,   HEENT: Normocephalic, PERRL, otherwise with in Normal limits  Chest:Chest symmetric Cardio vascular:  S1/S2, RRR, No murmure, No Rubs or Gallops  pulmonary: Clear to auscultation bilaterally, respirations unlabored, negative wheezes / crackles Abdomen: Soft, non-tender, non-distended, bowel sounds,no masses, no organomegaly Muscular skeletal: Limited exam - in bed, able to move all 4 extremities, Normal strength,  Neuro: CNII-XII intact. , normal motor and sensation, reflexes intact  Extremities: No pitting edema  lower extremities, +2 pulses  shoulder sling in place, immobile Skin: Dry, warm to touch, negative for any Rashes, No open wounds Wounds: per nursing documentation        LABs:  CBC Latest Ref Rng & Units 08/10/2019 08/09/2019 08/08/2019  WBC 4.0 - 10.5 K/uL 8.9 7.8 9.3  Hemoglobin 13.0 - 17.0 g/dL 08/10/2019 93.2 35.5  Hematocrit 39.0 - 52.0 % 45.4 42.6 41.3  Platelets 150 - 400 K/uL 245 195 188   CMP Latest Ref Rng & Units 08/09/2019 08/08/2019 08/03/2019  Glucose 70 - 99 mg/dL 08/05/2019) 202(R) 427(C)  BUN 8 - 23 mg/dL 16 13 623(J)  Creatinine 0.61 - 1.24 mg/dL 62(G 3.15 1.76  Sodium 135 - 145 mmol/L 142 141 141  Potassium 3.5 - 5.1 mmol/L 3.7 4.2 3.9  Chloride 98 - 111 mmol/L 108 106 103  CO2 22 - 32 mmol/L 29 28 29   Calcium 8.9 - 10.3 mg/dL ) 4.4(W) 9.3  Total Protein 6.5 - 8.1 g/dL - - -  Total Bilirubin 0.3 - 1.2 mg/dL - - -  Alkaline Phos 38 - 126 U/L - - -  AST 15 - 41 U/L - - -  ALT 0 - 44 U/L - - -    SIGNED: 1.0(U, MD, FACP, FHM. [facility],  Pager [phone]  If 7PM-7AM, please contact night-coverage [email or web address] [phone] [facility] 08/10/2019, 10:31 AM

## 2019-08-10 NOTE — Progress Notes (Signed)
Occupational Therapy Treatment Patient Details Name: Nathan Dean MRN: [id number] DOB: 1956-03-24 Today's Date: 08/10/2019    History of present illness Pt is 64 yo male s/p R shoulder arthroscopy with subacromial decompression and distal clavicle excision, biceps tenotomy, and rotator cuff repair. PMH of OSA, HTN, GERD, DM, TKA   OT comments  Pt seen for OT tx this date to f/u re: task modification for self care ADLs, sleep considerations, polar care and sling mgt, and hand/wrist exercise. Pt demos good understanding of exercise and verbalizes feeling more confident in sequence of donning sling and polar care. Pt does verbalize concerns about falling with new hardware and expresses relief to be going to rehab. Pt requires MAX A to don polar care and abduction pillow/sling and MOD/MAX A with UB dressing to adjust gown under polar care and sling. Overall, pt feeling more confident, but recognizing fxl areas that need improvement to confidently return home. SNF remains most prudent d/c recommendation at this time.    Follow Up Recommendations  SNF;Supervision - Intermittent(supv for all OOB activity)    Equipment Recommendations  Toilet riser;Other (comment)    Recommendations for Other Services      Precautions / Restrictions Precautions Precautions: Fall;Shoulder Shoulder Interventions: Shoulder sling/immobilizer;Shoulder abduction pillow;At all times Required Braces or Orthoses: Sling Restrictions RUE Weight Bearing: Non weight bearing Other Position/Activity Restrictions: Per MD, prefers pt to be NWB to RUE, but would permit a "light lean" of R elbow on platform walker       Mobility Bed Mobility               General bed mobility comments: Pt up in chair when OT presents for session.  Transfers                      Balance                                           ADL either performed or assessed with clinical judgement   ADL Overall  ADL's : Needs assistance/impaired                 Upper Body Dressing : Moderate assistance;Maximal assistance;Cueing for compensatory techniques;Cueing for UE precautions;Sitting Upper Body Dressing Details (indicate cue type and reason): cues to limit R shoulder shrug when pt attempting to assist OT with dressing. Informed to instead use L UE to modify task                         Vision Patient Visual Report: No change from baseline     Perception     Praxis      Cognition Arousal/Alertness: Awake/alert Behavior During Therapy: WFL for tasks assessed/performed Overall Cognitive Status: Within Functional Limits for tasks assessed                                          Exercises Other Exercises Other Exercises: OT facilitates education re: polar care and sling management including advocating for himself if he notes that staff assisting him are donning incorrectly. With education from OT, pt feels confident with sling and polar care positioning and feels he can help direct staff at [facility]. Other Exercises: OT facilitates education re: UB dressing task  modification including not attempting to shrug R shoulder to assist staff with donning R sleeve and using L UE modified technique with button up shirts primarily. Other Exercises: OT facilitaes education re: hand and wrist exercise to prevent swelling. Pt demos good understanding.   Shoulder Instructions       General Comments      Pertinent Vitals/ Pain       Pain Assessment: No/denies pain  Home Living                                          Prior Functioning/Environment              Frequency  Min 2X/week        Progress Toward Goals  OT Goals(current goals can now be found in the care plan section)  Progress towards OT goals: Progressing toward goals  Acute Rehab OT Goals Patient Stated Goal: to be able to move around and fall less often. OT Goal  Formulation: With patient Time For Goal Achievement: 08/22/19 Potential to Achieve Goals: Fair  Plan Discharge plan remains appropriate    Co-evaluation                 AM-PAC OT "6 Clicks" Daily Activity     Outcome Measure   Help from another person eating meals?: A Little Help from another person taking care of personal grooming?: A Little Help from another person toileting, which includes using toliet, bedpan, or urinal?: A Lot Help from another person bathing (including washing, rinsing, drying)?: A Lot Help from another person to put on and taking off regular upper body clothing?: A Lot Help from another person to put on and taking off regular lower body clothing?: A Lot 6 Click Score: 14    End of Session    OT Visit Diagnosis: Unsteadiness on feet (R26.81);History of falling (Z91.81);Muscle weakness (generalized) (M62.81)   Activity Tolerance Patient tolerated treatment well   Patient Left in chair;with call bell/phone within reach   Nurse Communication          Time: 9604-5409 OT Time Calculation (min): 29 min  Charges: OT General Charges $OT Visit: 1 Visit OT Treatments $Self Care/Home Management : 8-22 mins $Therapeutic Exercise: 8-22 mins  Rejeana Brock, MS, OTR/L ascom [phone] 08/10/19, 10:17 AM

## 2019-08-11 ENCOUNTER — Inpatient Hospital Stay
Admission: EM | Admit: 2019-08-11 | Discharge: 2019-08-17 | DRG: 175 | Disposition: A | Discharge status: Skilled Nursing Facility | Payer: Medicare Other | Attending: Internal Medicine | Admitting: Internal Medicine

## 2019-08-11 ENCOUNTER — Encounter: Payer: Self-pay | Admitting: Emergency Medicine

## 2019-08-11 ENCOUNTER — Emergency Department: Payer: Medicare Other

## 2019-08-11 ENCOUNTER — Other Ambulatory Visit: Payer: Self-pay

## 2019-08-11 DIAGNOSIS — M21371 Foot drop, right foot: Secondary | ICD-10-CM | POA: Diagnosis present

## 2019-08-11 DIAGNOSIS — I82401 Acute embolism and thrombosis of unspecified deep veins of right lower extremity: Secondary | ICD-10-CM | POA: Diagnosis present

## 2019-08-11 DIAGNOSIS — I160 Hypertensive urgency: Secondary | ICD-10-CM

## 2019-08-11 DIAGNOSIS — K219 Gastro-esophageal reflux disease without esophagitis: Secondary | ICD-10-CM | POA: Diagnosis present

## 2019-08-11 DIAGNOSIS — I5031 Acute diastolic (congestive) heart failure: Secondary | ICD-10-CM | POA: Diagnosis not present

## 2019-08-11 DIAGNOSIS — Z96653 Presence of artificial knee joint, bilateral: Secondary | ICD-10-CM | POA: Diagnosis present

## 2019-08-11 DIAGNOSIS — Z8041 Family history of malignant neoplasm of ovary: Secondary | ICD-10-CM | POA: Diagnosis not present

## 2019-08-11 DIAGNOSIS — I110 Hypertensive heart disease with heart failure: Secondary | ICD-10-CM | POA: Diagnosis present

## 2019-08-11 DIAGNOSIS — R0902 Hypoxemia: Secondary | ICD-10-CM

## 2019-08-11 DIAGNOSIS — Z7901 Long term (current) use of anticoagulants: Secondary | ICD-10-CM | POA: Diagnosis not present

## 2019-08-11 DIAGNOSIS — J810 Acute pulmonary edema: Secondary | ICD-10-CM | POA: Diagnosis not present

## 2019-08-11 DIAGNOSIS — I82441 Acute embolism and thrombosis of right tibial vein: Secondary | ICD-10-CM | POA: Diagnosis present

## 2019-08-11 DIAGNOSIS — Z87891 Personal history of nicotine dependence: Secondary | ICD-10-CM

## 2019-08-11 DIAGNOSIS — J9811 Atelectasis: Secondary | ICD-10-CM | POA: Diagnosis present

## 2019-08-11 DIAGNOSIS — Z20822 Contact with and (suspected) exposure to covid-19: Secondary | ICD-10-CM | POA: Diagnosis present

## 2019-08-11 DIAGNOSIS — Z9111 Patient's noncompliance with dietary regimen: Secondary | ICD-10-CM | POA: Diagnosis not present

## 2019-08-11 DIAGNOSIS — I5033 Acute on chronic diastolic (congestive) heart failure: Secondary | ICD-10-CM | POA: Diagnosis present

## 2019-08-11 DIAGNOSIS — R55 Syncope and collapse: Secondary | ICD-10-CM

## 2019-08-11 DIAGNOSIS — G4733 Obstructive sleep apnea (adult) (pediatric): Secondary | ICD-10-CM | POA: Diagnosis not present

## 2019-08-11 DIAGNOSIS — J690 Pneumonitis due to inhalation of food and vomit: Secondary | ICD-10-CM

## 2019-08-11 DIAGNOSIS — I10 Essential (primary) hypertension: Secondary | ICD-10-CM

## 2019-08-11 DIAGNOSIS — E1165 Type 2 diabetes mellitus with hyperglycemia: Secondary | ICD-10-CM | POA: Diagnosis present

## 2019-08-11 DIAGNOSIS — K5900 Constipation, unspecified: Secondary | ICD-10-CM | POA: Diagnosis present

## 2019-08-11 DIAGNOSIS — Z9841 Cataract extraction status, right eye: Secondary | ICD-10-CM

## 2019-08-11 DIAGNOSIS — R609 Edema, unspecified: Secondary | ICD-10-CM

## 2019-08-11 DIAGNOSIS — G9341 Metabolic encephalopathy: Secondary | ICD-10-CM | POA: Diagnosis present

## 2019-08-11 DIAGNOSIS — Z6838 Body mass index (BMI) 38.0-38.9, adult: Secondary | ICD-10-CM

## 2019-08-11 DIAGNOSIS — J9621 Acute and chronic respiratory failure with hypoxia: Secondary | ICD-10-CM | POA: Diagnosis present

## 2019-08-11 DIAGNOSIS — I2609 Other pulmonary embolism with acute cor pulmonale: Secondary | ICD-10-CM | POA: Diagnosis present

## 2019-08-11 DIAGNOSIS — Z961 Presence of intraocular lens: Secondary | ICD-10-CM | POA: Diagnosis present

## 2019-08-11 DIAGNOSIS — Z7982 Long term (current) use of aspirin: Secondary | ICD-10-CM

## 2019-08-11 DIAGNOSIS — R4182 Altered mental status, unspecified: Secondary | ICD-10-CM | POA: Diagnosis not present

## 2019-08-11 DIAGNOSIS — Z79899 Other long term (current) drug therapy: Secondary | ICD-10-CM

## 2019-08-11 DIAGNOSIS — Z9119 Patient's noncompliance with other medical treatment and regimen: Secondary | ICD-10-CM

## 2019-08-11 DIAGNOSIS — E785 Hyperlipidemia, unspecified: Secondary | ICD-10-CM | POA: Diagnosis present

## 2019-08-11 DIAGNOSIS — Z841 Family history of disorders of kidney and ureter: Secondary | ICD-10-CM

## 2019-08-11 DIAGNOSIS — E1142 Type 2 diabetes mellitus with diabetic polyneuropathy: Secondary | ICD-10-CM | POA: Diagnosis present

## 2019-08-11 DIAGNOSIS — Z8673 Personal history of transient ischemic attack (TIA), and cerebral infarction without residual deficits: Secondary | ICD-10-CM

## 2019-08-11 DIAGNOSIS — Z9842 Cataract extraction status, left eye: Secondary | ICD-10-CM

## 2019-08-11 DIAGNOSIS — Z794 Long term (current) use of insulin: Secondary | ICD-10-CM

## 2019-08-11 DIAGNOSIS — G910 Communicating hydrocephalus: Secondary | ICD-10-CM | POA: Diagnosis present

## 2019-08-11 HISTORY — DX: Obesity, unspecified: E66.9

## 2019-08-11 LAB — COMPREHENSIVE METABOLIC PANEL
ALT: 18 U/L (ref 0–44)
AST: 22 U/L (ref 15–41)
Albumin: 3.5 g/dL (ref 3.5–5.0)
Alkaline Phosphatase: 68 U/L (ref 38–126)
Anion gap: 7 (ref 5–15)
BUN: 18 mg/dL (ref 8–23)
CO2: 31 mmol/L (ref 22–32)
Calcium: 9 mg/dL (ref 8.9–10.3)
Chloride: 101 mmol/L (ref 98–111)
Creatinine, Ser: 0.71 mg/dL (ref 0.61–1.24)
GFR calc Af Amer: >60 mL/min (ref >60)
GFR calc non Af Amer: >60 mL/min (ref >60)
Glucose, Bld: 237 mg/dL — ABNORMAL HIGH (ref 70–99)
Potassium: 4.3 mmol/L (ref 3.5–5.1)
Sodium: 139 mmol/L (ref 135–145)
Total Bilirubin: 0.9 mg/dL (ref 0.3–1.2)
Total Protein: 6.7 g/dL (ref 6.5–8.1)

## 2019-08-11 LAB — URINALYSIS, COMPLETE (UACMP) WITH MICROSCOPIC
Bacteria, UA: NONE SEEN
Bilirubin Urine: NEGATIVE
Glucose, UA: >=500 mg/dL — AB
Hgb urine dipstick: NEGATIVE
Ketones, ur: 5 mg/dL — AB
Leukocytes,Ua: NEGATIVE
Nitrite: NEGATIVE
Protein, ur: NEGATIVE mg/dL
Specific Gravity, Urine: 1.04 — ABNORMAL HIGH (ref 1.005–1.030)
pH: 7 (ref 5.0–8.0)

## 2019-08-11 LAB — CBC WITH DIFFERENTIAL/PLATELET
Abs Immature Granulocytes: 0.05 10*3/uL (ref 0.00–0.07)
Basophils Absolute: 0.1 10*3/uL (ref 0.0–0.1)
Basophils Relative: 0 %
Eosinophils Absolute: 0.5 10*3/uL (ref 0.0–0.5)
Eosinophils Relative: 4 %
HCT: 46.8 % (ref 39.0–52.0)
Hemoglobin: 15.1 g/dL (ref 13.0–17.0)
Immature Granulocytes: 0 %
Lymphocytes Relative: 9 %
Lymphs Abs: 1.1 10*3/uL (ref 0.7–4.0)
MCH: 28.2 pg (ref 26.0–34.0)
MCHC: 32.3 g/dL (ref 30.0–36.0)
MCV: 87.3 fL (ref 80.0–100.0)
Monocytes Absolute: 0.8 10*3/uL (ref 0.1–1.0)
Monocytes Relative: 7 %
Neutro Abs: 9.3 10*3/uL — ABNORMAL HIGH (ref 1.7–7.7)
Neutrophils Relative %: 80 %
Platelets: 242 10*3/uL (ref 150–400)
RBC: 5.36 MIL/uL (ref 4.22–5.81)
RDW: 15.2 % (ref 11.5–15.5)
WBC: 11.8 10*3/uL — ABNORMAL HIGH (ref 4.0–10.5)
nRBC: 0 % (ref 0.0–0.2)

## 2019-08-11 LAB — TROPONIN I (HIGH SENSITIVITY)
Troponin I (High Sensitivity): 8 ng/L
Troponin I (High Sensitivity): 10 ng/L

## 2019-08-11 LAB — BRAIN NATRIURETIC PEPTIDE: B Natriuretic Peptide: 62 pg/mL (ref 0.0–100.0)

## 2019-08-11 LAB — GLUCOSE, CAPILLARY: Glucose-Capillary: 119 mg/dL — ABNORMAL HIGH (ref 70–99)

## 2019-08-11 LAB — LACTIC ACID, PLASMA
Lactic Acid, Venous: 2 mmol/L (ref 0.5–1.9)
Lactic Acid, Venous: 1.6 mmol/L (ref 0.5–1.9)

## 2019-08-11 LAB — RESPIRATORY PANEL BY RT PCR (FLU A&B, COVID)
Influenza A by PCR: NEGATIVE
Influenza B by PCR: NEGATIVE
SARS Coronavirus 2 by RT PCR: NEGATIVE

## 2019-08-11 LAB — PROCALCITONIN: Procalcitonin: <0.1 ng/mL

## 2019-08-11 MED ORDER — HYDROCOD POLST-CPM POLST ER 10-8 MG/5ML PO SUER: 5.0000 mL | Freq: Two times a day (BID) | ORAL | Status: DC | PRN

## 2019-08-11 MED ORDER — ONDANSETRON HCL 4 MG PO TABS: 4.0000 mg | ORAL_TABLET | Freq: Four times a day (QID) | ORAL | Status: DC | PRN

## 2019-08-11 MED ORDER — ROSUVASTATIN CALCIUM 10 MG PO TABS
20.0000 mg | ORAL_TABLET | Freq: Every day | ORAL | Status: DC
  Administered 2019-08-12 – 2019-08-16 (×5): 20 mg via ORAL
  Filled 2019-08-11 (×5): qty 2

## 2019-08-11 MED ORDER — FLEET ENEMA 7-19 GM/118ML RE ENEM
1.0000 | ENEMA | Freq: Every day | RECTAL | Status: DC | PRN
  Administered 2019-08-12: 1 via RECTAL

## 2019-08-11 MED ORDER — FUROSEMIDE 10 MG/ML IJ SOLN
40.0000 mg | Freq: Two times a day (BID) | INTRAMUSCULAR | Status: DC
  Administered 2019-08-11 – 2019-08-13 (×5): 40 mg via INTRAVENOUS
  Filled 2019-08-11 (×5): qty 4

## 2019-08-11 MED ORDER — ENOXAPARIN SODIUM 40 MG/0.4ML ~~LOC~~ SOLN: 40.0000 mg | SUBCUTANEOUS | Status: DC

## 2019-08-11 MED ORDER — CARVEDILOL 6.25 MG PO TABS
6.2500 mg | ORAL_TABLET | Freq: Two times a day (BID) | ORAL | Status: DC
  Administered 2019-08-12 – 2019-08-13 (×4): 6.25 mg via ORAL
  Filled 2019-08-11 (×4): qty 1

## 2019-08-11 MED ORDER — ACETAMINOPHEN 325 MG PO TABS
650.0000 mg | ORAL_TABLET | Freq: Four times a day (QID) | ORAL | Status: DC | PRN
  Administered 2019-08-15 – 2019-08-16 (×2): 650 mg via ORAL
  Filled 2019-08-11 (×2): qty 2

## 2019-08-11 MED ORDER — PANTOPRAZOLE SODIUM 40 MG PO TBEC
40.0000 mg | DELAYED_RELEASE_TABLET | Freq: Every day | ORAL | Status: DC
  Administered 2019-08-12 – 2019-08-17 (×7): 40 mg via ORAL
  Filled 2019-08-11 (×6): qty 1

## 2019-08-11 MED ORDER — ONDANSETRON HCL 4 MG PO TABS: 4.0000 mg | ORAL_TABLET | Freq: Three times a day (TID) | ORAL | Status: DC | PRN

## 2019-08-11 MED ORDER — VANCOMYCIN HCL IN DEXTROSE 1-5 GM/200ML-% IV SOLN
1000.0000 mg | Freq: Once | INTRAVENOUS | Status: DC
  Filled 2019-08-11: qty 200

## 2019-08-11 MED ORDER — LEVOFLOXACIN IN D5W 750 MG/150ML IV SOLN
750.0000 mg | INTRAVENOUS | Status: DC
  Administered 2019-08-11: 750 mg via INTRAVENOUS
  Filled 2019-08-11 (×2): qty 150

## 2019-08-11 MED ORDER — ADULT MULTIVITAMIN W/MINERALS CH
1.0000 | ORAL_TABLET | Freq: Every day | ORAL | Status: DC
  Administered 2019-08-11 – 2019-08-17 (×7): 1 via ORAL
  Filled 2019-08-11 (×7): qty 1

## 2019-08-11 MED ORDER — ACETAMINOPHEN 650 MG RE SUPP: 650.0000 mg | Freq: Four times a day (QID) | RECTAL | Status: DC | PRN

## 2019-08-11 MED ORDER — ONDANSETRON HCL 4 MG/2ML IJ SOLN: 4.0000 mg | Freq: Four times a day (QID) | INTRAMUSCULAR | Status: DC | PRN

## 2019-08-11 MED ORDER — EXENATIDE ER 2 MG ~~LOC~~ PEN: 2.0000 mg | PEN_INJECTOR | SUBCUTANEOUS | Status: DC

## 2019-08-11 MED ORDER — VANCOMYCIN HCL 1500 MG/300ML IV SOLN
1500.0000 mg | Freq: Once | INTRAVENOUS | Status: AC
  Administered 2019-08-11: 1500 mg via INTRAVENOUS
  Filled 2019-08-11: qty 300

## 2019-08-11 MED ORDER — ACETAMINOPHEN 500 MG PO TABS
1000.0000 mg | ORAL_TABLET | Freq: Once | ORAL | Status: AC
  Administered 2019-08-11: 1000 mg via ORAL
  Filled 2019-08-11: qty 2

## 2019-08-11 MED ORDER — MAGNESIUM HYDROXIDE 400 MG/5ML PO SUSP
30.0000 mL | Freq: Every day | ORAL | Status: DC | PRN
  Administered 2019-08-12: 30 mL via ORAL
  Filled 2019-08-11: qty 30

## 2019-08-11 MED ORDER — DOCUSATE SODIUM 100 MG PO CAPS
100.0000 mg | ORAL_CAPSULE | Freq: Two times a day (BID) | ORAL | Status: DC | PRN
  Administered 2019-08-12: 100 mg via ORAL
  Filled 2019-08-11: qty 1

## 2019-08-11 MED ORDER — LABETALOL HCL 5 MG/ML IV SOLN: 20.0000 mg | INTRAVENOUS | Status: DC | PRN

## 2019-08-11 MED ORDER — INSULIN PEN NEEDLE 32G X 4 MM MISC: 1.0000 | Freq: Three times a day (TID) | Status: DC

## 2019-08-11 MED ORDER — METRONIDAZOLE IN NACL 5-0.79 MG/ML-% IV SOLN: 500.0000 mg | Freq: Once | INTRAVENOUS | Status: DC

## 2019-08-11 MED ORDER — SODIUM CHLORIDE 0.9 % IV SOLN
1.0000 g | Freq: Once | INTRAVENOUS | Status: AC
  Administered 2019-08-11: 1 g via INTRAVENOUS
  Filled 2019-08-11: qty 1

## 2019-08-11 MED ORDER — INSULIN GLARGINE 100 UNIT/ML ~~LOC~~ SOLN
80.0000 [IU] | Freq: Every day | SUBCUTANEOUS | Status: DC
  Administered 2019-08-11 – 2019-08-17 (×7): 80 [IU] via SUBCUTANEOUS
  Filled 2019-08-11 (×8): qty 0.8

## 2019-08-11 MED ORDER — DULOXETINE HCL 30 MG PO CPEP
30.0000 mg | ORAL_CAPSULE | Freq: Every day | ORAL | Status: DC
  Administered 2019-08-12 – 2019-08-17 (×6): 30 mg via ORAL
  Filled 2019-08-11 (×7): qty 1

## 2019-08-11 MED ORDER — TRAZODONE HCL 50 MG PO TABS
25.0000 mg | ORAL_TABLET | Freq: Every evening | ORAL | Status: DC | PRN
  Administered 2019-08-12 – 2019-08-13 (×2): 25 mg via ORAL
  Filled 2019-08-11 (×3): qty 1

## 2019-08-11 MED ORDER — INSULIN ASPART 100 UNIT/ML ~~LOC~~ SOLN
0.0000 [IU] | Freq: Three times a day (TID) | SUBCUTANEOUS | Status: DC
  Administered 2019-08-12 (×2): 3 [IU] via SUBCUTANEOUS
  Administered 2019-08-12: 2 [IU] via SUBCUTANEOUS
  Administered 2019-08-13 (×2): 3 [IU] via SUBCUTANEOUS
  Administered 2019-08-13: 2 [IU] via SUBCUTANEOUS
  Administered 2019-08-13: 3 [IU] via SUBCUTANEOUS
  Administered 2019-08-14: 5 [IU] via SUBCUTANEOUS
  Administered 2019-08-14: 3 [IU] via SUBCUTANEOUS
  Administered 2019-08-14: 5 [IU] via SUBCUTANEOUS
  Administered 2019-08-14: 2 [IU] via SUBCUTANEOUS
  Administered 2019-08-15: 3 [IU] via SUBCUTANEOUS
  Administered 2019-08-15: 5 [IU] via SUBCUTANEOUS
  Administered 2019-08-15: 22:00:00 3 [IU] via SUBCUTANEOUS
  Administered 2019-08-15: 8 [IU] via SUBCUTANEOUS
  Administered 2019-08-16: 5 [IU] via SUBCUTANEOUS
  Administered 2019-08-16: 3 [IU] via SUBCUTANEOUS
  Administered 2019-08-16: 5 [IU] via SUBCUTANEOUS
  Administered 2019-08-17: 12:00:00 8 [IU] via SUBCUTANEOUS
  Administered 2019-08-17: 10:00:00 3 [IU] via SUBCUTANEOUS
  Filled 2019-08-11 (×19): qty 1

## 2019-08-11 MED ORDER — GUAIFENESIN ER 600 MG PO TB12
600.0000 mg | ORAL_TABLET | Freq: Two times a day (BID) | ORAL | Status: DC
  Administered 2019-08-12 – 2019-08-17 (×12): 600 mg via ORAL
  Filled 2019-08-11 (×12): qty 1

## 2019-08-11 MED ORDER — SODIUM CHLORIDE 0.9 % IV SOLN: INTRAVENOUS | Status: DC

## 2019-08-11 MED ORDER — ENOXAPARIN SODIUM 40 MG/0.4ML ~~LOC~~ SOLN
40.0000 mg | SUBCUTANEOUS | Status: DC
  Administered 2019-08-11: 40 mg via SUBCUTANEOUS
  Filled 2019-08-11: qty 0.4

## 2019-08-11 MED ORDER — GABAPENTIN 300 MG PO CAPS
900.0000 mg | ORAL_CAPSULE | Freq: Three times a day (TID) | ORAL | Status: DC
  Administered 2019-08-11 – 2019-08-13 (×5): 900 mg via ORAL
  Filled 2019-08-11 (×5): qty 3

## 2019-08-11 MED ORDER — VANCOMYCIN HCL 10 G IV SOLR
2500.0000 mg | Freq: Once | INTRAVENOUS | Status: DC
  Filled 2019-08-11: qty 2500

## 2019-08-11 MED ORDER — BISACODYL 5 MG PO TBEC
5.0000 mg | DELAYED_RELEASE_TABLET | Freq: Every day | ORAL | Status: DC | PRN
  Administered 2019-08-12: 5 mg via ORAL
  Filled 2019-08-11: qty 1

## 2019-08-11 MED ORDER — LOSARTAN POTASSIUM 50 MG PO TABS
100.0000 mg | ORAL_TABLET | Freq: Every day | ORAL | Status: DC
  Administered 2019-08-12 – 2019-08-13 (×3): 100 mg via ORAL
  Filled 2019-08-11 (×3): qty 2

## 2019-08-11 MED ORDER — MELATONIN 5 MG PO TABS
10.0000 mg | ORAL_TABLET | Freq: Every evening | ORAL | Status: DC | PRN
  Administered 2019-08-14 (×2): 10 mg via ORAL
  Filled 2019-08-11 (×3): qty 2

## 2019-08-11 MED ORDER — ASPIRIN EC 81 MG PO TBEC
81.0000 mg | DELAYED_RELEASE_TABLET | Freq: Every day | ORAL | Status: DC
  Administered 2019-08-12 – 2019-08-17 (×6): 81 mg via ORAL
  Filled 2019-08-11 (×6): qty 1

## 2019-08-11 MED ORDER — VANCOMYCIN HCL IN DEXTROSE 1-5 GM/200ML-% IV SOLN
1000.0000 mg | Freq: Once | INTRAVENOUS | Status: AC
  Administered 2019-08-11: 1000 mg via INTRAVENOUS

## 2019-08-11 MED ORDER — IOHEXOL 350 MG/ML SOLN
100.0000 mL | Freq: Once | INTRAVENOUS | Status: AC | PRN
  Administered 2019-08-11: 100 mL via INTRAVENOUS

## 2019-08-11 MED ORDER — OXYCODONE HCL 5 MG PO TABS
5.0000 mg | ORAL_TABLET | ORAL | Status: DC | PRN
  Administered 2019-08-12: 5 mg via ORAL
  Filled 2019-08-11: qty 1

## 2019-08-11 NOTE — ED Notes (Signed)
Pt transported to CT ?

## 2019-08-11 NOTE — ED Notes (Signed)
MD [facility] aware of pt's lactic acid level.

## 2019-08-11 NOTE — ED Notes (Signed)
Pt transferred to hospital bed due to pt's size and unable to move and turn on hospital stretcher.

## 2019-08-11 NOTE — ED Triage Notes (Signed)
Pt via EMS from [facility], he is there for rehab for his rotator cuff surgery. Pt was having a BM and had a near syncopal episode. Denies LOC and head injury. Pt c/o weakness and denies pain. Pt is on 2L [place] chronically since his surgery, pt was sat 90% on 2L and placed on 4L [place] on arrival, O2 sat 95. Pt is A&Ox4 and NAD at this time.

## 2019-08-11 NOTE — H&P (Addendum)
UA other conditions that would help you save your lifeDiminished bibasal breath sounds with bibasal rales   [place] [facility] [place] NAME: Nathan Dean    MR#:  [id number]  DATE OF BIRTH:  [birth year]  DATE OF ADMISSION:  08/11/2019  PRIMARY CARE PHYSICIAN: Wardell Honour, MD   REQUESTING/REFERRING PHYSICIAN: Derrell Lolling, MD  CHIEF COMPLAINT:   Chief Complaint  Patient presents with  . Near Syncope    HISTORY OF PRESENT ILLNESS:  Nathan Dean  is a 64 y.o. Caucasian male with a known history of hypertension, dyslipidemia, type 2 diabetes mellitus, obstructive sleep apnea with noncompliance to CPAP, who presented to the emergency room with acute onset of presyncope while having a bowel movement.  The patient had a recent right rotator cuff tear repair and was discharged from here on 3/25 to [facility] rehabilitation.  During his hospitalization and specifically postoperatively he was not able to maintain his O2 saturation and therefore was placed on 2 L of O2 by nasal cannula.  He has a CPAP but has not been using it on a regular basis.  His wife stated he has been having more sleepiness and lethargy during daytime.  He admitted to recent dyspnea on exertion as well as orthopnea lower extremity edema.  He has been constipated with narcotic use for his right shoulder recently.  He had a very hard time having a bowel movement today before he had presyncope when he felt so weak and per staff turned gray.  He denied any significant cough or wheezing.  No fever or chills.  No nausea or vomiting.  No bleeding diathesis.  The patient had his second dose of Pfizer vaccine for COVID-19 on 08/02/2019.  Upon presentation to the emergency room, blood pressure was 190/89 with a pulse of 111 pulse currently is 94% on 2 L O2 by nasal cannula and that was increased later to 4 L when he was down to 90%.  He was also noted to have a fever of 100.4.  Labs revealed a WBC of 11.8 with  neutrophilia and CMP was remarkable for blood glucose of 237.  BNP was 62 and high-sensitivity troponin I 8 than 10 with lactic acid of 2 and procalcitonin less than 0.1.  COVID-19 2-hour PCR is pending.  Urinalysis showed more than 500 glucose.  Chest x-ray showed persistent elevation of the right hemidiaphragm with right lower lung zone airspace opacity that may represent atelectasis or infiltrate.  Chest CTA found no evidence for pulmonary malaise and however showed cardiomegaly and mild bilateral upper lobe groundglass opacities that may reflect pulmonary edema with moderate right lower and right middle lobe atelectasis.  The patient was given IV meropenem and vancomycin.  He will be admitted to the medical monitored bed for further evaluation and management. PAST MEDICAL HISTORY:   Past Medical History:  Diagnosis Date  . 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 Diabetes ([place])   . GERD (gastroesophageal reflux disease)   . Hyperlipidemia   . Hypertension   . Sleep apnea     PAST SURGICAL HISTORY:   Past Surgical History:  Procedure Laterality Date  . BACK SURGERY     in the 80's -herniated disc  . BACK SURGERY     L4 bone spur 1990  . CARPAL TUNNEL RELEASE Right 12/28/2016   Procedure: RIGHT ULNAR AND MEDIAN NEUROPLASTY AT WRIST;  Surgeon: Milly Jakob, MD;  Location: [place];  Service: Orthopedics;  Laterality: Right;  . CARPAL TUNNEL RELEASE Left    25 years ago  . CATARACT EXTRACTION W/ INTRAOCULAR LENS IMPLANT Bilateral   . 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 SHOULDER OPEN ROTATOR CUFF REPAIR Right 08/07/2019   Procedure: ROTATOR CUFF REPAIR SHOULDER OPEN;  Surgeon: Thornton Park, MD;  Location: [facility];  Service: Orthopedics;  Laterality: Right;  . TONSILLECTOMY      SOCIAL HISTORY:   Social History   Tobacco Use  . Smoking status: Former Smoker    Packs/day: 2.00    Years: 20.00     Pack years: 40.00    Types: Cigarettes    Quit date: 06/28/1996    Years since quitting: 23.1  . Smokeless tobacco: Never Used  Substance Use Topics  . Alcohol use: Not Currently    Alcohol/week: 0.0 standard drinks    FAMILY HISTORY:   Family History  Problem Relation Age of Onset  . Ovarian cancer Mother   . Kidney failure Father   . Cancer Brother     DRUG ALLERGIES:   Allergies  Allergen Reactions  . Penicillins Anaphylaxis, Rash and Other (See Comments)  . Succinylcholine Chloride Anaphylaxis and Rash  . Keflex [Cephalexin] Rash  . Sulfa Antibiotics Rash and Other (See Comments)    REVIEW OF SYSTEMS:   ROS As per history of present illness. All pertinent systems were reviewed above. Constitutional,  HEENT, cardiovascular, respiratory, GI, GU, musculoskeletal, neuro, psychiatric, endocrine,  integumentary and hematologic systems were reviewed and are otherwise  negative/unremarkable except for positive findings mentioned above in the HPI.   MEDICATIONS AT HOME:   Prior to Admission medications   Medication Sig Start Date End Date Taking? Authorizing Provider  aspirin 81 MG tablet Take 1 tablet (81 mg total) by mouth daily. 08/11/16   Karamalegos, Devonne Doughty, DO  Blood Glucose Monitoring Suppl (ONE TOUCH ULTRA SYSTEM KIT) w/Device KIT 1 kit by Does not apply route once. Dx. E11.9 07/28/15   Arlis Porta., MD  carvedilol (COREG) 6.25 MG tablet TAKE 1 TABLET BY MOUTH TWICE A DAY WITH FOOD 10/21/18   Horald Pollen, MD  DULoxetine (CYMBALTA) 30 MG capsule Take 30 mg by mouth daily. 07/24/18   [provider]  enoxaparin (LOVENOX) 40 MG/0.4ML injection Inject 0.4 mLs (40 mg total) into the skin daily for 14 days. 08/10/19 08/24/19  Thornton Park, MD  Exenatide ER (BYDUREON) 2 MG PEN Inject 2 mg into the skin once a week.    [provider]  gabapentin (NEURONTIN) 300 MG capsule Take 3 capsules (900 mg total) by mouth 3 (three) times daily.  10/21/18   Wieting, Richard, MD  glucose blood test strip 1 each by Other route 4 (four) times daily. Use with One touch meter to check blood sugar. Dx E11.9 09/13/16   Mikey College, NP  Insulin Glargine [facility]) 100 UNIT/ML Inject 80 Units into the skin daily.    [provider]  Insulin Pen Needle (BD PEN NEEDLE NANO U/F) 32G X 4 MM MISC Inject 1 each as directed 3 (three) times daily. 04/18/18   Forrest Moron, MD  losartan (COZAAR) 100 MG tablet Take 1 tablet (100 mg total) by mouth daily. 05/01/19   Horald Pollen, MD  Melatonin 10 MG TABS Take 10 mg by mouth at bedtime as needed (sleep).    [provider]  metFORMIN (GLUCOPHAGE) 500 MG tablet Take 1,000  mg by mouth 2 (two) times daily with a meal.    [provider]  Multiple Vitamin (MULTIVITAMIN WITH MINERALS) TABS tablet Take 1 tablet by mouth daily.    [provider]  omeprazole (PRILOSEC) 20 MG capsule Take 20 mg by mouth daily.     [provider]  ondansetron (ZOFRAN) 4 MG tablet Take 1 tablet (4 mg total) by mouth every 8 (eight) hours as needed for nausea or vomiting. 08/07/19   Thornton Park, MD  ONE TOUCH LANCETS MISC 1 each by Does not apply route 4 (four) times daily. Use with one touch meter to check blood sugar. Dx: E11.9 09/16/16   Mikey College, NP  oxyCODONE (OXY IR/ROXICODONE) 5 MG immediate release tablet Take 1 tablet (5 mg total) by mouth every 4 (four) hours as needed. 08/07/19   Thornton Park, MD  rosuvastatin (CRESTOR) 20 MG tablet Take 20 mg by mouth daily.    [provider]      VITAL SIGNS:  Blood pressure (!) 185/75, pulse (!) 112, temperature (!) 100.4 F (38 C), temperature source Oral, resp. rate 17, height '6\' 9"'$  (2.057 m), weight (!) 163.3 kg, SpO2 94 %.  PHYSICAL EXAMINATION:  Physical Exam  GENERAL:  64 y.o.-year-old Caucasian male patient lying in the bed with mild respiratory stress with conversational dyspnea.    EYES: Pupils equal, round, reactive to light and accommodation. No scleral icterus. Extraocular muscles intact.  HEENT: Head atraumatic, normocephalic. Oropharynx and nasopharynx clear.  NECK:  Supple, no jugular venous distention. No thyroid enlargement, no tenderness.  LUNGS: Diminished bibasal breath sounds with bibasal rales. CARDIOVASCULAR: Regular rate and rhythm, S1, S2 normal. No murmurs, rubs, or gallops.  ABDOMEN: Soft, nondistended, nontender. Bowel sounds present. No organomegaly or mass.  EXTREMITIES: 1-2+ pitting bilateral  lower extremity edema,with no cyanosis, or clubbing.  NEUROLOGIC: Cranial nerves II through XII are intact. Muscle strength 5/5 in all extremities. Sensation intact. Gait not checked.  PSYCHIATRIC: The patient is alert and oriented x 3.  Normal affect and good eye contact. SKIN: No obvious rash, lesion, or ulcer.   LABORATORY PANEL:   CBC Recent Labs  Lab 08/11/19 1645  WBC 11.8*  HGB 15.1  HCT 46.8  PLT 242   ------------------------------------------------------------------------------------------------------------------  Chemistries  Recent Labs  Lab 08/11/19 1645  NA 139  K 4.3  CL 101  CO2 31  GLUCOSE 237*  BUN 18  CREATININE 0.71  CALCIUM 9.0  AST 22  ALT 18  ALKPHOS 68  BILITOT 0.9   ------------------------------------------------------------------------------------------------------------------  Cardiac Enzymes No results for input(s): TROPONINI in the last 168 hours. ------------------------------------------------------------------------------------------------------------------  RADIOLOGY:  CT Angio Chest PE W/Cm &/Or Wo Cm  Result Date: 08/11/2019 CLINICAL DATA:  Near syncopal episode. EXAM: CT ANGIOGRAPHY CHEST WITH CONTRAST TECHNIQUE: Multidetector CT imaging of the chest was performed using the standard protocol during bolus administration of intravenous contrast. Multiplanar CT image reconstructions and MIPs were  obtained to evaluate the vascular anatomy. CONTRAST:  139m OMNIPAQUE IOHEXOL 350 MG/ML SOLN COMPARISON:  Chest radiograph dated 08/11/2019 and CT chest dated 10/22/2018. FINDINGS: Cardiovascular: Satisfactory opacification of the pulmonary arteries to the segmental level. No evidence of pulmonary embolism. Vascular calcifications are seen in the aortic arch. The heart is mildly enlarged. No pericardial effusion. Mediastinum/Nodes: No enlarged mediastinal, hilar, or axillary lymph nodes. Thyroid gland, trachea, and esophagus demonstrate no significant findings. Lungs/Pleura: There is moderate right lower and right middle lobe atelectasis. Mild ground-glass opacities are seen in the bilateral  upper lobes, similar to prior exam. There is no pleural effusion or pneumothorax. Upper Abdomen: No acute abnormality. Musculoskeletal: No chest wall abnormality. No acute or significant osseous findings. Review of the MIP images confirms the above findings. IMPRESSION: 1. No evidence of pulmonary embolism. 2. Cardiomegaly and mild bilateral upper lobe ground-glass opacities likely reflect pulmonary edema. Moderate right lower and right middle lobe atelectasis. Aortic Atherosclerosis (ICD10-I70.0). Electronically Signed   By: Zerita Boers M.D.   On: 08/11/2019 18:30   DG Chest Port 1 View  Result Date: 08/11/2019 CLINICAL DATA:  Syncope. EXAM: PORTABLE CHEST 1 VIEW COMPARISON:  August 08, 2019 FINDINGS: There is persistent elevation of the right hemidiaphragm with a right lower lung zone airspace opacity. There is no pneumothorax. No large pleural effusion. No acute osseous abnormality. IMPRESSION: Persistent elevation of the right hemidiaphragm with a right lower lung zone airspace opacity which may represent atelectasis or infiltrate. Electronically Signed   By: Constance Holster M.D.   On: 08/11/2019 17:24      IMPRESSION AND PLAN:   1.  Acute pulmonary edema, possibly with new onset acute CHF likely diastolic  with acute hypoxic respiratory failure..  This could be associated with acute cor pulmonale secondary to underlying aspiration pneumonia given abnormal chest CT findings together with fever and leukocytosis. -The patient will be admitted to a medical monitored bed. -He will be diuresed with IV Lasix. -We will obtain a 2D echo in a.m. and a cardiology consultation.  I notified Dr. Rayann Heman with [facility] about the patient. -The patient will be continued on antibiotic therapy with IV Levaquin. -Obtain sputum Gram stain culture and sensitivity as well as follow blood cultures. -Pneumonia antigens will be obtained.  2.  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 -The patient will be placed on supplement coverage with NovoLog and will continue his basal coverage. -We will continue Neurontin.  3.  Hypertensive urgency. -This could be contributing to new onset acute CHF. -We will continue his antihypertensives including Cozaar and Coreg. -He will be placed on as needed IV labetalol.  4.  Presyncope. -This could be neurally mediated and possibly secondary to worsening hypoxemia. -We will check orthostatics every 12 hours. -O2 protocol will be followed. -2D echo will be obtained as mentioned above.  5.  Obstructive sleep apnea with noncompliance to CPAP. -This could be contributing to his hypertensive urgency as well as hypoxemia. -We will have a CPAP nightly here.  6.  Dyslipidemia. -Statin therapy will be resumed.  7.  Constipation. -He will be placed on as needed Colace, Dulcolax and Fleet enema in addition to milk of magnesia.  8.  DVT prophylaxis. -Subcutaneous Lovenox  All the records are reviewed and case discussed with ED provider. The plan of care was discussed in details with the patient (and family). I answered all questions. The patient agreed to proceed with the above mentioned plan. Further management will depend upon hospital course.   CODE STATUS: Full  code    The patient is admitted to inpatient status due to the intensity of medical problems, potential risks involved and management requirement that will necessitate more than 2 midnights.   TOTAL TIME TAKING CARE OF THIS PATIENT: 55 minutes.    Christel Mormon M.D on 08/11/2019 at 8:12 PM  [facility]   From 7 PM-7 AM, contact night-coverage [email or web address]  CC: Primary care physician; Wardell Honour, MD   Note: This dictation was prepared with Dragon dictation along with smaller phrase technology.  Any transcriptional errors that result from this process are unintentional.

## 2019-08-11 NOTE — ED Notes (Signed)
Pt assisted with urinal

## 2019-08-11 NOTE — Progress Notes (Signed)
PHARMACY -  BRIEF ANTIBIOTIC NOTE   Pharmacy has received consult(s) for Vancomycin, Meropenem from an ED provider.  The patient's profile has been reviewed for ht/wt/allergies/indication/available labs.    One time order(s) placed for Vancomycin 2500 mg IV X 1 and Meropenem 1 gm IV X 1.   Further antibiotics/pharmacy consults should be ordered by admitting physician if indicated.                       Thank you, Jannat Rosemeyer D 08/11/2019  7:25 PM

## 2019-08-11 NOTE — ED Provider Notes (Signed)
[facility] Emergency Department Provider Note  ____________________________________________   First MD Initiated Contact with Patient 08/11/19 1640     (approximate)  I have reviewed the triage vital signs and the nursing notes.  History  Chief Complaint Near Syncope    HPI Nathan Dean is a 64 y.o. male with a history of HTN, HLD, sleep apnea, DM, recent right rotator cuff repair on 08/07/19 who presents to the emergency department from rehab for an episode of near syncope.  Patient states he was originally trying to have a bowel movement on a bedpan, but was unable to.  The facility assisted in transferring him over to the toilet.  On the toilet he was straining to have a bowel movement and had an episode of near syncope.  He denies any complete loss of consciousness.  Complains of some generalized weakness, but otherwise has no acute complaints.  Denies any chest pain or shortness of breath.  Patient has been on 2 L nasal cannula since discharge from his recent surgery, prior to this was not on any supplemental oxygen.  On EMS arrival patient's oxygen was 90% on 2 L, increased to 4 L nasal cannula.  Tachycardic on arrival to 111.  Patient reports some mild, symmetric BLE swelling, which is chronic and unchanged.  No history of VTE.  Denies any fevers, cough, sick contacts.  Denies any history of seizures or arrhythmias. Last BM was 1 week ago, which is normal for him.   Past Medical Hx Past Medical History:  Diagnosis Date  . 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 Diabetes ([place])   . GERD (gastroesophageal reflux disease)   . Hyperlipidemia   . Hypertension   . Sleep apnea     Problem List Patient Active Problem List   Diagnosis Date Noted  . Oxygen desaturation 08/08/2019  . S/P right rotator cuff repair 08/07/2019  . Hypoxia 10/22/2018  . Infection of right prepatellar bursa 10/18/2018  . Sepsis ([place])  10/18/2018  . Chest congestion 07/20/2017  . Viral illness 07/20/2017  . Lower respiratory infection 07/12/2017  . Laryngitis, acute 07/12/2017  . Cough 07/12/2017  . Pleurisy 07/12/2017  . Depression 06/03/2015  . HTN (hypertension) 01/02/2015  . Hyperlipidemia 01/02/2015  . 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 Sleep apnea 09/21/2010  . Arthritis, degenerative 02/17/2009  . Hereditary and idiopathic peripheral neuropathy 11/21/2008  . AD (atopic dermatitis) 07/08/2008  . Acid reflux 04/05/2007    Past Surgical Hx Past Surgical History:  Procedure Laterality Date  . BACK SURGERY     in the 80's -herniated disc  . BACK SURGERY     L4 bone spur 1990  . CARPAL TUNNEL RELEASE Right 12/28/2016   Procedure: RIGHT ULNAR AND MEDIAN NEUROPLASTY AT WRIST;  Surgeon: Milly Jakob, MD;  Location: [facility];  Service: Orthopedics;  Laterality: Right;  . CARPAL TUNNEL RELEASE Left    25 years ago  . CATARACT EXTRACTION W/ INTRAOCULAR LENS IMPLANT Bilateral   . 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 SHOULDER OPEN ROTATOR CUFF REPAIR Right 08/07/2019   Procedure: ROTATOR CUFF REPAIR SHOULDER OPEN;  Surgeon: Thornton Park, MD;  Location: [facility];  Service: Orthopedics;  Laterality: Right;  . TONSILLECTOMY      Medications Prior to Admission medications   Medication Sig Start Date End Date Taking? Authorizing Provider  aspirin  81 MG tablet Take 1 tablet (81 mg total) by mouth daily. 08/11/16   Karamalegos, Devonne Doughty, DO  Blood Glucose Monitoring Suppl (ONE TOUCH ULTRA SYSTEM KIT) w/Device KIT 1 kit by Does not apply route once. Dx. E11.9 07/28/15   Arlis Porta., MD  carvedilol (COREG) 6.25 MG tablet TAKE 1 TABLET BY MOUTH TWICE A DAY WITH FOOD 10/21/18   Horald Pollen, MD  DULoxetine (CYMBALTA) 30 MG capsule Take 30 mg by mouth daily. 07/24/18   [provider]  enoxaparin  (LOVENOX) 40 MG/0.4ML injection Inject 0.4 mLs (40 mg total) into the skin daily for 14 days. 08/10/19 08/24/19  Thornton Park, MD  Exenatide ER (BYDUREON) 2 MG PEN Inject 2 mg into the skin once a week.    [provider]  gabapentin (NEURONTIN) 300 MG capsule Take 3 capsules (900 mg total) by mouth 3 (three) times daily. 10/21/18   Wieting, Richard, MD  glucose blood test strip 1 each by Other route 4 (four) times daily. Use with One touch meter to check blood sugar. Dx E11.9 09/13/16   Mikey College, NP  Insulin Glargine [facility]) 100 UNIT/ML Inject 80 Units into the skin daily.    [provider]  Insulin Pen Needle (BD PEN NEEDLE NANO U/F) 32G X 4 MM MISC Inject 1 each as directed 3 (three) times daily. 04/18/18   Forrest Moron, MD  losartan (COZAAR) 100 MG tablet Take 1 tablet (100 mg total) by mouth daily. 05/01/19   Horald Pollen, MD  Melatonin 10 MG TABS Take 10 mg by mouth at bedtime as needed (sleep).    [provider]  metFORMIN (GLUCOPHAGE) 500 MG tablet Take 1,000 mg by mouth 2 (two) times daily with a meal.    [provider]  Multiple Vitamin (MULTIVITAMIN WITH MINERALS) TABS tablet Take 1 tablet by mouth daily.    [provider]  omeprazole (PRILOSEC) 20 MG capsule Take 20 mg by mouth daily.     [provider]  ondansetron (ZOFRAN) 4 MG tablet Take 1 tablet (4 mg total) by mouth every 8 (eight) hours as needed for nausea or vomiting. 08/07/19   Thornton Park, MD  ONE TOUCH LANCETS MISC 1 each by Does not apply route 4 (four) times daily. Use with one touch meter to check blood sugar. Dx: E11.9 09/16/16   Mikey College, NP  oxyCODONE (OXY IR/ROXICODONE) 5 MG immediate release tablet Take 1 tablet (5 mg total) by mouth every 4 (four) hours as needed. 08/07/19   Thornton Park, MD  rosuvastatin (CRESTOR) 20 MG tablet Take 20 mg by mouth daily.    [provider]    Allergies Penicillins,  Succinylcholine chloride, Keflex [cephalexin], and Sulfa antibiotics  Family Hx Family History  Problem Relation Age of Onset  . Ovarian cancer Mother   . Kidney failure Father   . Cancer Brother     Social Hx Social History   Tobacco Use  . Smoking status: Former Smoker    Packs/day: 2.00    Years: 20.00    Pack years: 40.00    Types: Cigarettes    Quit date: 06/28/1996    Years since quitting: 23.1  . Smokeless tobacco: Never Used  Substance Use Topics  . Alcohol use: Not Currently    Alcohol/week: 0.0 standard drinks  . Drug use: No     Review of Systems  Constitutional: Negative for fever. Negative for chills. + near syncope Eyes:  Negative for visual changes. ENT: Negative for sore throat. Cardiovascular: Negative for chest pain. Respiratory: Negative for shortness of breath. Gastrointestinal: Negative for nausea. Negative for vomiting.  Genitourinary: Negative for dysuria. Musculoskeletal: Negative for leg swelling. Skin: Negative for rash. Neurological: Negative for headaches.   Physical Exam  Vital Signs: ED Triage Vitals  Enc Vitals Group     BP 08/11/19 1643 (!) 190/89     Pulse Rate 08/11/19 1643 (!) 111     Resp 08/11/19 1643 13     Temp --      Temp src --      SpO2 08/11/19 1643 94 %     Weight 08/11/19 1645 (!) 360 lb (163.3 kg)     Height 08/11/19 1645 6' 9"  (2.057 m)     Head Circumference --      Peak Flow --      Pain Score 08/11/19 1643 0     Pain Loc --      Pain Edu? --      Excl. in [place]? --     Constitutional: Alert and oriented. Obese.  Head: Normocephalic. Atraumatic. Eyes: Conjunctivae clear, sclera anicteric. Pupils equal and symmetric. Nose: No masses or lesions. No congestion or rhinorrhea. Mouth/Throat: Wearing mask.  Neck: No stridor. Trachea midline.  Cardiovascular: Tachycardic, regular rhythm. Extremities well perfused. Respiratory: Normal respiratory effort.  Lungs CTAB, though limited due to body habitus. On 4-5 L  [place]. Gastrointestinal: Soft. Non-distended. Non-tender.  Genitourinary: Deferred. Musculoskeletal: RUE dressed and sling 2/2 recent surgery. Trace, if any LE edema/swelling. No asymmetry.  Neurologic:  Normal speech and language. No gross focal or lateralizing neurologic deficits are appreciated.  Skin: Skin is warm, dry and intact. No rash noted. Psychiatric: Mood and affect are appropriate for situation.  EKG  Personally reviewed and interpreted by myself.   Rate: 112 Rhythm: sinus Axis: leftward Intervals: WNL No STEMI    Radiology  CT  IMPRESSION:  1. No evidence of pulmonary embolism.  2. Cardiomegaly and mild bilateral upper lobe ground-glass opacities  likely reflect pulmonary edema. Moderate right lower and right  middle lobe atelectasis.   CXR  IMPRESSION:  Persistent elevation of the right hemidiaphragm with a right lower  lung zone airspace opacity which may represent atelectasis or  infiltrate.    Procedures  Procedure(s) performed (including critical care):  .Critical Care Performed by: Lilia Pro., MD Authorized by: Lilia Pro., MD   Critical care provider statement:    Critical care time (minutes):  35   Critical care was time spent personally by me on the following activities:  Discussions with consultants, evaluation of patient's response to treatment, examination of patient, ordering and performing treatments and interventions, ordering and review of laboratory studies, ordering and review of radiographic studies, pulse oximetry, re-evaluation of patient's condition, obtaining history from patient or surrogate and review of old charts     Initial Impression / Assessment and Plan / MDM / ED Course  64 y.o. male who presents to the ED for an episode of near syncope while trying to have a BM, as above. Recent rotator cuff surgery.   Ddx: defecation syncope, vasovagal, dehydration, arrhythmia, anemia, PE, pulmonary infection  Will plan for  labs, imaging, EKG  7:05 PM Patient noted to have a fever while in the ED. Given fever, tachycardia, increased O2 requirement, and recent surgery, will cover with antibiotics. Will cover broadly given recent hospitalization and potential aspiration with surgery.    CT is negative  for PE but findings do also suggest some pulmonary edema, will therefore defer 30 cc/kg bolus.  Concern for possible infection and edema contributing.  Given CT findings, rapid COVID ordered.  10:35 PM Labs notable for WBC 11.8, normal troponin, normal BNP, negative COVID, initial lactic acid 2. Received ~500 cc fluids with antibiotic volume (deferred full 30 cc/kg as noted above) with improvement in lactic acid to 1.6.  Patient admitted to hospitalist.   _______________________________   As part of my medical decision making I have reviewed available labs, radiology tests, reviewed old records.  Final Clinical Impression(s) / ED Diagnosis  Final diagnoses:  Near syncope  Hypoxia       Note:  This document was prepared using Dragon voice recognition software and may include unintentional dictation errors.   Lilia Pro., MD 08/12/19 [id number]

## 2019-08-12 ENCOUNTER — Inpatient Hospital Stay: Payer: Medicare Other

## 2019-08-12 ENCOUNTER — Inpatient Hospital Stay (HOSPITAL_COMMUNITY)
Admit: 2019-08-12 | Discharge: 2019-08-12 | Disposition: A | Discharge status: Home / Self Care | Payer: Medicare Other | Attending: Family Medicine | Admitting: Family Medicine

## 2019-08-12 ENCOUNTER — Encounter: Payer: Self-pay | Admitting: Family Medicine

## 2019-08-12 DIAGNOSIS — I5031 Acute diastolic (congestive) heart failure: Secondary | ICD-10-CM

## 2019-08-12 DIAGNOSIS — G9341 Metabolic encephalopathy: Secondary | ICD-10-CM | POA: Diagnosis present

## 2019-08-12 DIAGNOSIS — J810 Acute pulmonary edema: Secondary | ICD-10-CM | POA: Diagnosis present

## 2019-08-12 DIAGNOSIS — I110 Hypertensive heart disease with heart failure: Secondary | ICD-10-CM

## 2019-08-12 DIAGNOSIS — G4733 Obstructive sleep apnea (adult) (pediatric): Secondary | ICD-10-CM

## 2019-08-12 LAB — ECHOCARDIOGRAM COMPLETE
Height: 81 in
Weight: 5764.8 oz

## 2019-08-12 LAB — BASIC METABOLIC PANEL
Anion gap: 8 (ref 5–15)
BUN: 19 mg/dL (ref 8–23)
CO2: 29 mmol/L (ref 22–32)
Calcium: 8.5 mg/dL — ABNORMAL LOW (ref 8.9–10.3)
Chloride: 102 mmol/L (ref 98–111)
Creatinine, Ser: 0.7 mg/dL (ref 0.61–1.24)
GFR calc Af Amer: >60 mL/min (ref >60)
GFR calc non Af Amer: >60 mL/min (ref >60)
Glucose, Bld: 157 mg/dL — ABNORMAL HIGH (ref 70–99)
Potassium: 3.9 mmol/L (ref 3.5–5.1)
Sodium: 139 mmol/L (ref 135–145)

## 2019-08-12 LAB — GLUCOSE, CAPILLARY
Glucose-Capillary: 194 mg/dL — ABNORMAL HIGH (ref 70–99)
Glucose-Capillary: 169 mg/dL — ABNORMAL HIGH (ref 70–99)
Glucose-Capillary: 101 mg/dL — ABNORMAL HIGH (ref 70–99)
Glucose-Capillary: 147 mg/dL — ABNORMAL HIGH (ref 70–99)
Glucose-Capillary: 199 mg/dL — ABNORMAL HIGH (ref 70–99)

## 2019-08-12 LAB — HEPARIN LEVEL (UNFRACTIONATED): Heparin Unfractionated: 1.05 IU/mL — ABNORMAL HIGH (ref 0.30–0.70)

## 2019-08-12 LAB — CBC
HCT: 45.4 % (ref 39.0–52.0)
Hemoglobin: 14.1 g/dL (ref 13.0–17.0)
MCH: 28.3 pg (ref 26.0–34.0)
MCHC: 31.1 g/dL (ref 30.0–36.0)
MCV: 91 fL (ref 80.0–100.0)
Platelets: 184 10*3/uL (ref 150–400)
RBC: 4.99 MIL/uL (ref 4.22–5.81)
RDW: 15.6 % — ABNORMAL HIGH (ref 11.5–15.5)
WBC: 10.2 10*3/uL (ref 4.0–10.5)
nRBC: 0 % (ref 0.0–0.2)

## 2019-08-12 LAB — STREP PNEUMONIAE URINARY ANTIGEN: Strep Pneumo Urinary Antigen: NEGATIVE

## 2019-08-12 LAB — HIV ANTIBODY (ROUTINE TESTING W REFLEX): HIV Screen 4th Generation wRfx: NONREACTIVE

## 2019-08-12 MED ORDER — HEPARIN (PORCINE) 25000 UT/250ML-% IV SOLN
2400.0000 [IU]/h | INTRAVENOUS | Status: DC
  Administered 2019-08-12: 2400 [IU]/h via INTRAVENOUS
  Filled 2019-08-12 (×2): qty 250

## 2019-08-12 MED ORDER — MORPHINE BOLUS VIA INFUSION: 1.0000 mg | Freq: Four times a day (QID) | INTRAVENOUS | Status: DC | PRN

## 2019-08-12 MED ORDER — SENNOSIDES-DOCUSATE SODIUM 8.6-50 MG PO TABS
1.0000 | ORAL_TABLET | Freq: Two times a day (BID) | ORAL | Status: DC
  Administered 2019-08-12 – 2019-08-16 (×7): 1 via ORAL
  Filled 2019-08-12 (×10): qty 1

## 2019-08-12 MED ORDER — MORPHINE SULFATE (PF) 2 MG/ML IV SOLN
2.0000 mg | INTRAVENOUS | Status: DC | PRN
  Administered 2019-08-12 – 2019-08-13 (×3): 2 mg via INTRAVENOUS
  Filled 2019-08-12 (×3): qty 1

## 2019-08-12 MED ORDER — PERFLUTREN LIPID MICROSPHERE
1.0000 mL | INTRAVENOUS | Status: AC | PRN
  Administered 2019-08-12: 4 mL via INTRAVENOUS
  Filled 2019-08-12: qty 10

## 2019-08-12 MED ORDER — MORPHINE SULFATE (PF) 2 MG/ML IV SOLN
1.0000 mg | Freq: Four times a day (QID) | INTRAVENOUS | Status: DC | PRN
  Administered 2019-08-12: 1 mg via INTRAVENOUS
  Filled 2019-08-12: qty 1

## 2019-08-12 MED ORDER — OXYCODONE-ACETAMINOPHEN 5-325 MG PO TABS
1.0000 | ORAL_TABLET | ORAL | Status: DC | PRN
  Administered 2019-08-12 – 2019-08-13 (×4): 1 via ORAL
  Filled 2019-08-12 (×4): qty 1

## 2019-08-12 MED ORDER — HEPARIN BOLUS VIA INFUSION
5000.0000 [IU] | Freq: Once | INTRAVENOUS | Status: AC
  Administered 2019-08-12: 5000 [IU] via INTRAVENOUS
  Filled 2019-08-12: qty 5000

## 2019-08-12 MED ORDER — POLYETHYLENE GLYCOL 3350 17 G PO PACK
17.0000 g | PACK | Freq: Every day | ORAL | Status: DC
  Administered 2019-08-13 – 2019-08-14 (×2): 17 g via ORAL
  Filled 2019-08-12 (×5): qty 1

## 2019-08-12 NOTE — Progress Notes (Signed)
Pts BMI is 38.62. ReDs reading unable to be done d/t BMI being too high and pts immobility of right shoulder d/t recent surgery.

## 2019-08-12 NOTE — Progress Notes (Signed)
Sizewise bed ordered. Awaiting arrival. Will notify incoming shift. Will continue to monitor.

## 2019-08-12 NOTE — Progress Notes (Signed)
[facility] Progress Note  Patient: Nathan Dean    ZOX:[id number]  DOA: 08/11/2019     Date of Service: the patient was seen and examined on 08/12/2019  Chief Complaint  Patient presents with  . Near Syncope   Brief hospital course: Past medical history of HTN, HLD, type II DM, OSA noncompliant with CPAP.  Presented with complaints of near syncope while having a bowel movement.  Recent hospitalization for rotator cuff tear repair requiring SNF, discharged on 08/09/2019. Currently further plan is further work-up for acute respiratory failure and treatment.  Assessment and Plan: 1.  Acute on chronic hypoxic respiratory failure Acute pulmonary edema/acute on chronic HFpEF Hypertensive urgency OSA, noncompliant with CPAP Possible healthcare associated/aspiration pneumonia Possible sepsis POA given tachycardia and leukocytosis as well as fever Patient presents with complaints of a near syncopal event while having a bowel movement. On arrival he was found to have elevated blood pressure, tachycardia and low-grade temp.  Requiring 5 LPM to maintain adequate saturation. At present I was able to wean him down to 2 LPM.  CT PE protocol was performed which is negative for pulmonary embolism. There is also no evidence of pneumonia and procalcitonin level is negative. EKG does not show any evidence of acute ischemia. Serial troponins are negative as well. We will monitor recommendation from cardiology. Patient was started on IV Lasix. Continue  Continue Coreg and Cozaar. Echocardiogram ordered. Continue to monitor on telemetry. Patient is started on IV Levaquin. Stop as less likely infection. Follow-up on infectious work-up.  2.  Near syncope Constipation Near syncope in the setting of attempting bowel movement. Syncope work-up was initiated. Continue bowel regimen. Monitor response.  3.  OSA compliant with CPAP. Pt was using CPAP religiously every night at home. At SNF it was  no setup well per pt.  Currently on CPAP nightly. Monitor improvement.  4.  Type 2 diabetes mellitus, uncontrolled with hyperglycemia with peripheral neuropathy Continue sliding scale insulin.  Continue Neurontin.  5.  Hyperlipidemia Continue statin  6.  Recent shoulder surgery Continue oxycodone per home regimen. As needed morphine for severe pain  7.  GERD. Continue PPI.  8. Body mass index is 38.61 kg/m.   9.  Bilateral lower extremity edema with warmth. Currently does not appear to have any cellulitis but monitor closely. We will check lower extremity Doppler rule out DVT.  Diet: Cardiac and carb modified diet DVT Prophylaxis: Subcutaneous Lovenox   Advance goals of care discussion: Full code  Family Communication: family was present at bedside, at the time of interview.   Disposition:  Pt is from SNF (not from home), admitted with near syncope found to have acute on chronic hypoxic respiratory failure requiring 5 LPM, still has hypoxia and respiratory distress, which precludes a safe discharge. Discharge to SNF, when medically stable.  Subjective: Continues to feel shortness of breath continues to feel tiredness and fatigue.  No nausea no vomiting.  No dizziness or lightheadedness.  No focal deficit.  Still has pain in his right shoulder which is operated  Physical Exam: General:  alert oriented to time, place, and person.  Appear in moderate distress, affect appropriate Eyes: PERRL ENT: Oral Mucosa Clear, moist  Neck: difficult to assess  JVD,  Cardiovascular: S1 and S2 Present, no Murmur,  Respiratory: increased respiratory effort, Bilateral Air entry equal and Decreased, bilateral  Crackles, no wheezes Abdomen: Bowel Sound present, Soft and no tenderness,  Skin: no rash Extremities: bilateral  Pedal edema, no calf tenderness Neurologic:  without any new focal findings Gait not checked due to patient safety concerns  Vitals:   08/12/19 0220 08/12/19 0404  08/12/19 0734 08/12/19 0737  BP: 122/69 118/64 119/76 119/76  Pulse: 99 96 98 98  Resp:  20 17 17   Temp:  98.6 F (37 C) 97.7 F (36.5 C) 97.7 F (36.5 C)  TempSrc:  Oral    SpO2:  95% 95% 97%  Weight:      Height:        Intake/Output Summary (Last 24 hours) at 08/12/2019 0800 Last data filed at 08/12/2019 6270 Gross per 24 hour  Intake 128.33 ml  Output 0 ml  Net 128.33 ml   Filed Weights   08/11/19 1645 08/12/19 0200  Weight: (!) 163.3 kg (!) 163.4 kg    Data Reviewed: I have personally reviewed and interpreted daily labs, tele strips, imagings as discussed above. I reviewed all nursing notes, pharmacy notes, vitals, pertinent old records I have discussed plan of care as described above with RN and patient/family.  CBC: Recent Labs  Lab 08/08/19 0553 08/09/19 0519 08/10/19 0559 08/11/19 1645 08/12/19 0510  WBC 9.3 7.8 8.9 11.8* 10.2  NEUTROABS  --   --   --  9.3*  --   HGB 13.1 13.2 14.2 15.1 14.1  HCT 41.3 42.6 45.4 46.8 45.4  MCV 88.2 89.5 88.7 87.3 91.0  PLT 188 195 245 242 350   Basic Metabolic Panel: Recent Labs  Lab 08/08/19 0553 08/09/19 0519 08/11/19 1645 08/12/19 0510  NA 141 142 139 139  K 4.2 3.7 4.3 3.9  CL 106 108 101 102  CO2 28 29 31 29   GLUCOSE 215* 107* 237* 157*  BUN 13 16 18 19   CREATININE 0.63 0.80 0.71 0.70  CALCIUM 8.7* 8.4* 9.0 8.5*    Studies: CT Angio Chest PE W/Cm &/Or Wo Cm  Result Date: 08/11/2019 CLINICAL DATA:  Near syncopal episode. EXAM: CT ANGIOGRAPHY CHEST WITH CONTRAST TECHNIQUE: Multidetector CT imaging of the chest was performed using the standard protocol during bolus administration of intravenous contrast. Multiplanar CT image reconstructions and MIPs were obtained to evaluate the vascular anatomy. CONTRAST:  151mL OMNIPAQUE IOHEXOL 350 MG/ML SOLN COMPARISON:  Chest radiograph dated 08/11/2019 and CT chest dated 10/22/2018. FINDINGS: Cardiovascular: Satisfactory opacification of the pulmonary arteries to the  segmental level. No evidence of pulmonary embolism. Vascular calcifications are seen in the aortic arch. The heart is mildly enlarged. No pericardial effusion. Mediastinum/Nodes: No enlarged mediastinal, hilar, or axillary lymph nodes. Thyroid gland, trachea, and esophagus demonstrate no significant findings. Lungs/Pleura: There is moderate right lower and right middle lobe atelectasis. Mild ground-glass opacities are seen in the bilateral upper lobes, similar to prior exam. There is no pleural effusion or pneumothorax. Upper Abdomen: No acute abnormality. Musculoskeletal: No chest wall abnormality. No acute or significant osseous findings. Review of the MIP images confirms the above findings. IMPRESSION: 1. No evidence of pulmonary embolism. 2. Cardiomegaly and mild bilateral upper lobe ground-glass opacities likely reflect pulmonary edema. Moderate right lower and right middle lobe atelectasis. Aortic Atherosclerosis (ICD10-I70.0). Electronically Signed   By: Zerita Boers M.D.   On: 08/11/2019 18:30   DG Chest Port 1 View  Result Date: 08/11/2019 CLINICAL DATA:  Syncope. EXAM: PORTABLE CHEST 1 VIEW COMPARISON:  August 08, 2019 FINDINGS: There is persistent elevation of the right hemidiaphragm with a right lower lung zone airspace opacity. There is no pneumothorax. No large pleural effusion. No acute osseous abnormality. IMPRESSION: Persistent elevation of the  right hemidiaphragm with a right lower lung zone airspace opacity which may represent atelectasis or infiltrate. Electronically Signed   By: Katherine Mantle M.D.   On: 08/11/2019 17:24    Scheduled Meds: . aspirin EC  81 mg Oral Daily  . carvedilol  6.25 mg Oral BID WC  . DULoxetine  30 mg Oral Daily  . enoxaparin  40 mg Subcutaneous Q24H  . furosemide  40 mg Intravenous Q12H  . gabapentin  900 mg Oral TID  . guaiFENesin  600 mg Oral BID  . insulin aspart  0-15 Units Subcutaneous TID PC & HS  . insulin glargine  80 Units Subcutaneous Daily    . losartan  100 mg Oral Daily  . multivitamin with minerals  1 tablet Oral Daily  . pantoprazole  40 mg Oral Daily  . rosuvastatin  20 mg Oral Daily   Continuous Infusions: . levofloxacin (LEVAQUIN) IV Stopped (08/12/19 0035)   PRN Meds: acetaminophen **OR** acetaminophen, bisacodyl, chlorpheniramine-HYDROcodone, docusate sodium, labetalol, magnesium hydroxide, melatonin, morphine injection, ondansetron **OR** ondansetron (ZOFRAN) IV, oxyCODONE, sodium phosphate, traZODone  Time spent: 35 minutes  Author: Lynden Oxford, MD [facility] 08/12/2019 8:00 AM  To reach On-call, see care teams to locate the attending and reach out to them via [email or web address]. If 7PM-7AM, please contact night-coverage If you still have difficulty reaching the attending provider, please page the [facility] (Director on Call) for [facility] on amion for assistance.

## 2019-08-12 NOTE — Progress Notes (Signed)
PT Cancellation Note  Patient Details Name: Nathan Dean MRN: [id number] DOB: December 30, 1955   Cancelled Treatment:    Reason Eval/Treat Not Completed: Medical issues which prohibited therapy.  Newly diagnosed DVT, will re-assess when pt is medically more stable.     Nathan Dean 08/12/2019, 1:04 PM   Nathan Dean, PT MS Acute Rehab Dept. Number: [facility] [id number] and [facility] [phone]

## 2019-08-12 NOTE — Plan of Care (Signed)
  Problem: Education: Goal: Knowledge of General Education information will improve Description: Including pain rating scale, medication(s)/side effects and non-pharmacologic comfort measures Outcome: Progressing   Problem: Pain Managment: Goal: General experience of comfort will improve Outcome: Progressing   Problem: Safety: Goal: Ability to remain free from injury will improve Outcome: Progressing   

## 2019-08-12 NOTE — Progress Notes (Signed)
ANTICOAGULATION CONSULT NOTE - Initial Consult  Pharmacy Consult for Heparin  Indication: DVT  Allergies  Allergen Reactions  . Penicillins Anaphylaxis, Rash and Other (See Comments)  . Succinylcholine Chloride Anaphylaxis and Rash  . Keflex [Cephalexin] Rash  . Sulfa Antibiotics Rash and Other (See Comments)    Patient Measurements: Height: 6' 9" (205.7 cm) Weight: (!) 360 lb 4.8 oz (163.4 kg) IBW/kg (Calculated) : 98.3 Heparin Dosing Weight: 135 kg   Vital Signs: Temp: 97.8 F (36.6 C) (03/28 1215) BP: 112/56 (03/28 1215) Pulse Rate: 93 (03/28 1215)  Labs: Recent Labs    08/10/19 0559 08/10/19 0559 08/11/19 1645 08/11/19 1847 08/12/19 0510  HGB 14.2   < > 15.1  --  14.1  HCT 45.4  --  46.8  --  45.4  PLT 245  --  242  --  184  CREATININE  --   --  0.71  --  0.70  TROPONINIHS  --   --  8 10  --    < > = values in this interval not displayed.    Estimated Creatinine Clearance: 161.8 mL/min (by C-G formula based on SCr of 0.7 mg/dL).   Medical History: Past Medical History:  Diagnosis Date  . 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 Diabetes (HCC)   . GERD (gastroesophageal reflux disease)   . Hyperlipidemia   . Hypertension   . Obesity   . Sleep apnea     Medications:  Medications Prior to Admission  Medication Sig Dispense Refill Last Dose  . aspirin 81 MG tablet Take 1 tablet (81 mg total) by mouth daily. 30 tablet  Past Week at Unknown time  . carvedilol (COREG) 6.25 MG tablet TAKE 1 TABLET BY MOUTH TWICE A DAY WITH FOOD 60 tablet 0 Past Week at Unknown time  . DULoxetine (CYMBALTA) 30 MG capsule Take 30 mg by mouth daily.   Past Week at Unknown time  . enoxaparin (LOVENOX) 40 MG/0.4ML injection Inject 0.4 mLs (40 mg total) into the skin daily for 14 days. 5.6 mL 0 08/11/2019 at Unknown time  . Exenatide ER (BYDUREON) 2 MG PEN Inject 2 mg into the skin once a week.   Past Week at Unknown time  . gabapentin (NEURONTIN)  300 MG capsule Take 3 capsules (900 mg total) by mouth 3 (three) times daily.   Past Week at Unknown time  . Insulin Glargine (BASAGLAR KWIKPEN) 100 UNIT/ML Inject 80 Units into the skin daily.   Unknown at Unknown  . losartan (COZAAR) 100 MG tablet Take 1 tablet (100 mg total) by mouth daily. 30 tablet 0 Unknown at Unknown  . Melatonin 10 MG TABS Take 10 mg by mouth at bedtime as needed (sleep).   prn at prn  . metFORMIN (GLUCOPHAGE) 500 MG tablet Take 1,000 mg by mouth 2 (two) times daily with a meal.   Unknown at Unknown  . Multiple Vitamin (MULTIVITAMIN WITH MINERALS) TABS tablet Take 1 tablet by mouth daily.   Unknown at Unknown  . omeprazole (PRILOSEC) 20 MG capsule Take 20 mg by mouth daily.    Unknown at Unknown  . ondansetron (ZOFRAN) 4 MG tablet Take 1 tablet (4 mg total) by mouth every 8 (eight) hours as needed for nausea or vomiting. 30 tablet 0 prn at prn  . oxyCODONE (OXY IR/ROXICODONE) 5 MG immediate release tablet Take 1 tablet (5 mg total) by mouth every 4 (four) hours as needed. 40 tablet 0 prn at   prn  . rosuvastatin (CRESTOR) 20 MG tablet Take 20 mg by mouth daily.   Unknown at Unknown  . Blood Glucose Monitoring Suppl (ONE TOUCH ULTRA SYSTEM KIT) w/Device KIT 1 kit by Does not apply route once. Dx. E11.9 1 each 0   . glucose blood test strip 1 each by Other route 4 (four) times daily. Use with One touch meter to check blood sugar. Dx E11.9 100 each 12   . Insulin Pen Needle (BD PEN NEEDLE NANO U/F) 32G X 4 MM MISC Inject 1 each as directed 3 (three) times daily. 300 each 3   . ONE TOUCH LANCETS MISC 1 each by Does not apply route 4 (four) times daily. Use with one touch meter to check blood sugar. Dx: E11.9 200 each 12     Assessment: Pharmacy consulted to dose heparin in this 64 year old male with newly diagnosed DVT.  CrCl = 161.8 ml/min.   Pt received dose of lovenox 40 mg on 3/27 @ 2300.   Goal of Therapy:  Heparin level 0.3-0.7 units/ml Monitor platelets by  anticoagulation protocol: Yes   Plan:  Give 5000 units bolus x 1 Start heparin infusion at 2400 units/hr Check anti-Xa level in 6 hours and daily while on heparin Continue to monitor H&H and platelets  , D 08/12/2019,4:09 PM  

## 2019-08-12 NOTE — Progress Notes (Addendum)
PHARMACIST - PHYSICIAN COMMUNICATION  CONCERNING:  Enoxaparin (Lovenox) for DVT Prophylaxis    RECOMMENDATION: Patient was prescribed enoxaprin 40mg  q24 hours for VTE prophylaxis.   Filed Weights   08/11/19 1645 08/12/19 0200  Weight: (!) 360 lb (163.3 kg) (!) 360 lb 4.8 oz (163.4 kg)    Body mass index is 38.61 kg/m.  Estimated Creatinine Clearance: 161.8 mL/min (by C-G formula based on SCr of 0.7 mg/dL).   Patient weight updated, BMI is now < 40; will continue with orders as is - enoxaparin 40mg  Q24H  08/14/19, PharmD Clinical Pharmacist  08/12/2019 10:02 AM

## 2019-08-12 NOTE — Progress Notes (Addendum)
Pt was admitted on the floor with no signs of distress. Alert and oriented x4. VSS. Pt ascom is within pt reached and educated about safety. Will continue to monitor.  Pt complaining of right upper arm pain 8/10. Pt also brought his CPAP at home and wants to wear it at this time. Pt was wearing his polar care (part that is attached to his shoulder but do not have the container that you put ice in). Will notify incoming shift. Notify prime and talked to [place] NP and states will place order.  Update 0320: Pt has an ordered for IV fluids running at 100/hr. Lactic was at 2.0 initially now at 1.6. Pt was also on IV lasix twice a day and oxygen 5 liters acute. Notify prime and talked to [place] and ordered to stop normal saline fluids. Will continue to monitor.

## 2019-08-12 NOTE — Consult Note (Signed)
Cardiology Consultation:   Patient ID: Nathan Dean MRN: [id number]; DOB: 02-Dec-1955  Admit date: 08/11/2019 Date of Consult: 08/12/2019  Primary Care Provider: Ethelda Chick, MD Primary Cardiologist: Dr Elease Hashimoto in 2016   Patient Profile:   Nathan Dean is a 64 y.o. male with a hx of sleep apnea (uses CPAP at home), HTN, diabetes and obesity who is being seen today for the evaluation of volume overload at the request of Dr Allena Katz.  History of Present Illness:   Nathan Dean presented with symptoms or presyncope during a bowel movement (vagal).  He has been constipated.  He is s/p recent shoulder surgery.    Also had recent worsening of chronic SOB.  He has OSA and was not allowed to use CPAP in rehab.  CTA in ED revealed pulmonary edema.  He was admitted for further evaluation.  No arrhythmias.  No further symptoms of bradycardia.  No chest pain.  Past Medical History:  Diagnosis Date  . 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 Diabetes (HCC)   . GERD (gastroesophageal reflux disease)   . Hyperlipidemia   . Hypertension   . Obesity   . Sleep apnea     Past Surgical History:  Procedure Laterality Date  . BACK SURGERY     in the 80's -herniated disc  . BACK SURGERY     L4 bone spur 1990  . CARPAL TUNNEL RELEASE Right 12/28/2016   Procedure: RIGHT ULNAR AND MEDIAN NEUROPLASTY AT WRIST;  Surgeon: Mack Hook, MD;  Location: [facility];  Service: Orthopedics;  Laterality: Right;  . CARPAL TUNNEL RELEASE Left    25 years ago  . CATARACT EXTRACTION W/ INTRAOCULAR LENS IMPLANT Bilateral   . 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 SHOULDER OPEN ROTATOR CUFF REPAIR Right 08/07/2019   Procedure: ROTATOR CUFF REPAIR SHOULDER OPEN;  Surgeon: Juanell Fairly, MD;  Location: [facility];  Service: Orthopedics;  Laterality: Right;  . TONSILLECTOMY         Inpatient Medications: Scheduled Meds: . aspirin EC   81 mg Oral Daily  . carvedilol  6.25 mg Oral BID WC  . DULoxetine  30 mg Oral Daily  . enoxaparin  40 mg Subcutaneous Q24H  . furosemide  40 mg Intravenous Q12H  . gabapentin  900 mg Oral TID  . guaiFENesin  600 mg Oral BID  . insulin aspart  0-15 Units Subcutaneous TID PC & HS  . insulin glargine  80 Units Subcutaneous Daily  . losartan  100 mg Oral Daily  . multivitamin with minerals  1 tablet Oral Daily  . pantoprazole  40 mg Oral Daily  . polyethylene glycol  17 g Oral Daily  . rosuvastatin  20 mg Oral Daily  . senna-docusate  1 tablet Oral BID   Continuous Infusions:  PRN Meds: acetaminophen **OR** acetaminophen, bisacodyl, chlorpheniramine-HYDROcodone, labetalol, magnesium hydroxide, melatonin, morphine injection, ondansetron **OR** ondansetron (ZOFRAN) IV, oxyCODONE-acetaminophen, sodium phosphate, traZODone  Allergies:    Allergies  Allergen Reactions  . Penicillins Anaphylaxis, Rash and Other (See Comments)  . Succinylcholine Chloride Anaphylaxis and Rash  . Keflex [Cephalexin] Rash  . Sulfa Antibiotics Rash and Other (See Comments)    Social History:   Social History   Socioeconomic History  . Marital status: Married    Spouse name: Not on file  . Number of children: 0  . Years of education: Not on file  . Highest education level:  Associate degree: academic program  Occupational History  . Occupation: Currently Working   Tobacco Use  . Smoking status: Former Smoker    Packs/day: 2.00    Years: 20.00    Pack years: 40.00    Types: Cigarettes    Quit date: 06/28/1996    Years since quitting: 23.1  . Smokeless tobacco: Never Used  Substance and Sexual Activity  . Alcohol use: Not Currently    Alcohol/week: 0.0 standard drinks  . Drug use: No  . Sexual activity: Not on file  Other Topics Concern  . Not on file  Social History Narrative   Right handed    Lives at home spouse    Caffeine 5 -6 cups     Social Determinants of Health   Financial  Resource Strain:   . Difficulty of Paying Living Expenses:   Food Insecurity:   . Worried About Programme researcher, broadcasting/film/video in the Last Year:   . Barista in the Last Year:   Transportation Needs:   . Freight forwarder (Medical):   Marland Kitchen Lack of Transportation (Non-Medical):   Physical Activity:   . Days of Exercise per Week:   . Minutes of Exercise per Session:   Stress:   . Feeling of Stress :   Social Connections:   . Frequency of Communication with Friends and Family:   . Frequency of Social Gatherings with Friends and Family:   . Attends Religious Services:   . Active Member of Clubs or Organizations:   . Attends Banker Meetings:   Marland Kitchen Marital Status:   Intimate Partner Violence:   . Fear of Current or Ex-Partner:   . Emotionally Abused:   Marland Kitchen Physically Abused:   . Sexually Abused:     Family History:    Family History  Problem Relation Age of Onset  . Ovarian cancer Mother   . Kidney failure Father   . Cancer Brother      ROS:  Please see the history of present illness.   All other ROS reviewed and negative.     Physical Exam/Data:   Vitals:   08/12/19 0404 08/12/19 0734 08/12/19 0737 08/12/19 1215  BP: 118/64 119/76 119/76 (!) 112/56  Pulse: 96 98 98 93  Resp: Temp: 98.6 F (37 C) 97.7 F (36.5 C) 97.7 F (36.5 C) 97.8 F (36.6 C)  TempSrc: Oral     SpO2: 95% 95% 97% 92%  Weight:      Height:        Intake/Output Summary (Last 24 hours) at 08/12/2019 1507 Last data filed at 08/12/2019 1330 Gross per 24 hour  Intake 368.33 ml  Output 450 ml  Net -81.67 ml   Last 3 Weights 08/12/2019 08/11/2019 07/30/2019  Weight (lbs) 360 lb 4.8 oz 360 lb 360 lb  Weight (kg) 163.431 kg 163.295 kg 163.295 kg     Body mass index is 38.61 kg/m.  General:  obese HEENT: normal Cardiac:  RRR Lungs:  Normal WOB Abd: soft  Ext: no edema Musculoskeletal:  No deformities, BUE and BLE strength normal and equal Skin: warm and dry  Neuro:  CNs  2-12 intact, no focal abnormalities noted Psych:  Normal affect   EKG:  The EKG was personally reviewed and demonstrates:  Sinus tachycardia, LVH Telemetry:  Telemetry was personally reviewed and demonstrates:  Sinus rhythm  Relevant CV Studies: Echo today reveals EF 60%, mild LVH, grade I diastolic dysfunction  Laboratory  Data:  High Sensitivity Troponin:   Recent Labs  Lab 08/11/19 1645 08/11/19 1847  TROPONINIHS 8 10     Chemistry Recent Labs  Lab 08/09/19 0519 08/11/19 1645 08/12/19 0510  NA 142 139 139  K 3.7 4.3 3.9  CL 108 101 102  CO2 29 31 29   GLUCOSE 107* 237* 157*  BUN 16 18 19   CREATININE 0.80 0.71 0.70  CALCIUM 8.4* 9.0 8.5*  GFRNONAA >60 >60 >60  GFRAA >60 >60 >60  ANIONGAP 5 7 8     Recent Labs  Lab 08/11/19 1645  PROT 6.7  ALBUMIN 3.5  AST 22  ALT 18  ALKPHOS 68  BILITOT 0.9   Hematology Recent Labs  Lab 08/10/19 0559 08/11/19 1645 08/12/19 0510  WBC 8.9 11.8* 10.2  RBC 5.12 5.36 4.99  HGB 14.2 15.1 14.1  HCT 45.4 46.8 45.4  MCV 88.7 87.3 91.0  MCH 27.7 28.2 28.3  MCHC 31.3 32.3 31.1  RDW 15.4 15.2 15.6*  PLT 245 242 184   BNP Recent Labs  Lab 08/08/19 1350 08/11/19 1645  BNP 152.0* 62.0    DDimer No results for input(s): DDIMER in the last 168 hours.   Radiology/Studies:  CT Angio Chest PE W/Cm &/Or Wo Cm  Result Date: 08/11/2019 CLINICAL DATA:  Near syncopal episode. EXAM: CT ANGIOGRAPHY CHEST WITH CONTRAST TECHNIQUE: Multidetector CT imaging of the chest was performed using the standard protocol during bolus administration of intravenous contrast. Multiplanar CT image reconstructions and MIPs were obtained to evaluate the vascular anatomy. CONTRAST:  08/10/19 OMNIPAQUE IOHEXOL 350 MG/ML SOLN COMPARISON:  Chest radiograph dated 08/11/2019 and CT chest dated 10/22/2018. FINDINGS: Cardiovascular: Satisfactory opacification of the pulmonary arteries to the segmental level. No evidence of pulmonary embolism. Vascular calcifications  are seen in the aortic arch. The heart is mildly enlarged. No pericardial effusion. Mediastinum/Nodes: No enlarged mediastinal, hilar, or axillary lymph nodes. Thyroid gland, trachea, and esophagus demonstrate no significant findings. Lungs/Pleura: There is moderate right lower and right middle lobe atelectasis. Mild ground-glass opacities are seen in the bilateral upper lobes, similar to prior exam. There is no pleural effusion or pneumothorax. Upper Abdomen: No acute abnormality. Musculoskeletal: No chest wall abnormality. No acute or significant osseous findings. Review of the MIP images confirms the above findings. IMPRESSION: 1. No evidence of pulmonary embolism. 2. Cardiomegaly and mild bilateral upper lobe ground-glass opacities likely reflect pulmonary edema. Moderate right lower and right middle lobe atelectasis. Aortic Atherosclerosis (ICD10-I70.0). Electronically Signed   By: M.D.   On: 08/11/2019 18:30   12/22/2018 Venous Img Lower Bilateral (DVT)  Result Date: 08/12/2019 CLINICAL DATA:  Bilateral lower extremity edema. EXAM: BILATERAL LOWER EXTREMITY VENOUS DOPPLER ULTRASOUND TECHNIQUE: Gray-scale sonography with graded compression, as well as color Doppler and duplex ultrasound were performed to evaluate the lower extremity deep venous systems from the level of the common femoral vein and including the common femoral, femoral, profunda femoral, popliteal and calf veins including the posterior tibial, peroneal and gastrocnemius veins when visible. The superficial great saphenous vein was also interrogated. Spectral Doppler was utilized to evaluate flow at rest and with distal augmentation maneuvers in the common femoral, femoral and popliteal veins. COMPARISON:  None. FINDINGS: RIGHT LOWER EXTREMITY Common Femoral Vein: No evidence of thrombus. Normal compressibility, respiratory phasicity and response to augmentation. Saphenofemoral Junction: No evidence of thrombus. Normal compressibility  and flow on color Doppler imaging. Profunda Femoral Vein: No evidence of thrombus. Normal compressibility and flow on color Doppler imaging. Femoral Vein:  No evidence of thrombus. Normal compressibility, respiratory phasicity and response to augmentation. Popliteal Vein: No evidence of thrombus. Normal compressibility, respiratory phasicity and response to augmentation. Calf Veins: There appears to be likely occlusive thrombus in the right posterior tibial vein. The right peroneal vein is normally patent. Superficial Great Saphenous Vein: No evidence of thrombus. Normal compressibility. Venous Reflux:  None. Other Findings: No evidence of superficial thrombophlebitis or abnormal fluid collection. LEFT LOWER EXTREMITY Common Femoral Vein: No evidence of thrombus. Normal compressibility, respiratory phasicity and response to augmentation. Saphenofemoral Junction: No evidence of thrombus. Normal compressibility and flow on color Doppler imaging. Profunda Femoral Vein: No evidence of thrombus. Normal compressibility and flow on color Doppler imaging. Femoral Vein: No evidence of thrombus. Normal compressibility, respiratory phasicity and response to augmentation. Popliteal Vein: No evidence of thrombus. Normal compressibility, respiratory phasicity and response to augmentation. Calf Veins: No evidence of thrombus. Normal compressibility and flow on color Doppler imaging. Superficial Great Saphenous Vein: No evidence of thrombus. Normal compressibility. Venous Reflux:  None. Other Findings: No evidence of superficial thrombophlebitis or abnormal fluid collection. IMPRESSION: Deep vein thrombus isolated to the right posterior tibial vein in the calf. Other deep veins in both lower extremities are normally patent. Electronically Signed   By: Irish Lack M.D.   On: 08/12/2019 12:03   DG Chest Port 1 View  Result Date: 08/11/2019 CLINICAL DATA:  Syncope. EXAM: PORTABLE CHEST 1 VIEW COMPARISON:  August 08, 2019  FINDINGS: There is persistent elevation of the right hemidiaphragm with a right lower lung zone airspace opacity. There is no pneumothorax. No large pleural effusion. No acute osseous abnormality. IMPRESSION: Persistent elevation of the right hemidiaphragm with a right lower lung zone airspace opacity which may represent atelectasis or infiltrate. Electronically Signed   By: Katherine Mantle M.D.   On: 08/11/2019 17:24   ECHOCARDIOGRAM COMPLETE  Result Date: 08/12/2019    ECHOCARDIOGRAM REPORT   Patient Name:   Nathan Dean Date of Exam: 08/12/2019 Medical Rec #:  [id number]       Height:       81.0 in Accession #:    [id number]      Weight:       360.3 lb Date of Birth:  Oct 27, 1955        BSA:          2.979 m Patient Age:    64 years        BP:           119/76 mmHg Patient Gender: M               HR:           91 bpm. Exam Location:  [facility] Procedure: 2D Echo and Intracardiac Opacification Agent Indications:     Diastolic CHF  History:         Patient has prior history of Echocardiogram examinations. Risk                  Factors:Hypertension, Diabetes and Dyslipidemia.  Sonographer:     LTM Referring Phys:  [id number] Vernetta Honey MANSY Diagnosing Phys: Julien Nordmann MD IMPRESSIONS  1. Left ventricular ejection fraction, by estimation, is 60 to 65%. The left ventricle has normal function. The left ventricle has no regional wall motion abnormalities. There is mild left ventricular hypertrophy. Left ventricular diastolic parameters are consistent with Grade I diastolic dysfunction (impaired relaxation).  2. Right ventricular systolic function is normal. The right ventricular size is normal. There is  mildly elevated pulmonary artery systolic pressure. FINDINGS  Left Ventricle: Left ventricular ejection fraction, by estimation, is 60 to 65%. The left ventricle has normal function. The left ventricle has no regional wall motion abnormalities. The left ventricular internal cavity size was normal in size. There is  mild  left ventricular hypertrophy. Left ventricular diastolic parameters are consistent with Grade I diastolic dysfunction (impaired relaxation). Right Ventricle: The right ventricular size is normal. No increase in right ventricular wall thickness. Right ventricular systolic function is normal. There is mildly elevated pulmonary artery systolic pressure. The tricuspid regurgitant velocity is 2.58  m/s, and with an assumed right atrial pressure of 5 mmHg, the estimated right ventricular systolic pressure is 31.6 mmHg. Left Atrium: Left atrial size was normal in size. Right Atrium: Right atrial size was normal in size. Pericardium: There is no evidence of pericardial effusion. Mitral Valve: The mitral valve was not well visualized. Normal mobility of the mitral valve leaflets. No evidence of mitral valve regurgitation. No evidence of mitral valve stenosis. Tricuspid Valve: The tricuspid valve is not well visualized. Tricuspid valve regurgitation is trivial. No evidence of tricuspid stenosis. Aortic Valve: The aortic valve was not well visualized. Aortic valve regurgitation is not visualized. Mild aortic valve sclerosis is present, with no evidence of aortic valve stenosis. Aortic valve mean gradient measures 10.0 mmHg. Aortic valve peak gradient measures 15.4 mmHg. Aortic valve area, by VTI measures 1.73 cm. Pulmonic Valve: The pulmonic valve was not well visualized. Pulmonic valve regurgitation is not visualized. No evidence of pulmonic stenosis. Aorta: The aortic root is normal in size and structure and the aortic root was not well visualized. Venous: The pulmonary veins were not well visualized. The inferior vena cava is normal in size with greater than 50% respiratory variability, suggesting right atrial pressure of 3 mmHg. IAS/Shunts: No atrial level shunt detected by color flow Doppler.  LEFT VENTRICLE PLAX 2D LVIDd:         4.07 cm  Diastology LVIDs:         3.36 cm  LV e' lateral:   5.87 cm/s LV PW:         1.46  cm  LV E/e' lateral: 11.1 LV IVS:        1.38 cm  LV e' medial:    5.66 cm/s LVOT diam:     1.90 cm  LV E/e' medial:  11.5 LV SV:         54 LV SV Index:   18 LVOT Area:     2.84 cm  RIGHT VENTRICLE RV S prime:     19.70 cm/s TAPSE (M-mode): 2.3 cm LEFT ATRIUM         Index LA diam:    3.90 cm 1.31 cm/m  AORTIC VALVE                    PULMONIC VALVE AV Area (Vmax):    1.27 cm     PV Vmax:       1.51 m/s AV Area (Vmean):   1.22 cm     PV Vmean:      90.000 cm/s AV Area (VTI):     1.73 cm     PV VTI:        0.234 m AV Vmax:           196.00 cm/s  PV Peak grad:  9.1 mmHg AV Vmean:          145.000 cm/s PV Mean grad:  4.0 mmHg AV VTI:            0.316 m AV Peak Grad:      15.4 mmHg AV Mean Grad:      10.0 mmHg LVOT Vmax:         87.90 cm/s LVOT Vmean:        62.200 cm/s LVOT VTI:          0.192 m LVOT/AV VTI ratio: 0.61  AORTA Ao Root diam: 3.40 cm MITRAL VALVE                TRICUSPID VALVE MV Area (PHT): 3.06 cm     TR Peak grad:   26.6 mmHg MV E velocity: 65.00 cm/s   TR Vmax:        258.00 cm/s MV A velocity: 104.00 cm/s MV E/A ratio:  0.62         SHUNTS                             Systemic VTI:  0.19 m                             Systemic Diam: 1.90 cm Ida Rogue MD Electronically signed by Ida Rogue MD Signature Date/Time: 08/12/2019/1:27:53 PM    Final      Assessment and Plan:   1. Acute diastolic dysfunction Likely multifactorial, due to obesity, elevated BP, recent noncompliance with CPAP at rehab, dietary noncompliance.  This is not a primary cardiac issue. No further cardiology workup is advised Continue gentle IV diuresis Will likely need oral lasix on discharge The importance of lifestyle modification was discussed at length Sodium restriction advised  2. OSA Compliance with CPAP encouraged  3. 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 Morbid obesity Lifestyle modification is advised   5. HL He is on a statin  Cardiology to be  available as needed Please call with questions  For questions or updates, please contact [place] Please consult [email or web address] for contact info under     Signed, Thompson Grayer, MD  08/12/2019 3:07 PM

## 2019-08-13 DIAGNOSIS — R609 Edema, unspecified: Secondary | ICD-10-CM

## 2019-08-13 LAB — GLUCOSE, CAPILLARY
Glucose-Capillary: 190 mg/dL — ABNORMAL HIGH (ref 70–99)
Glucose-Capillary: 190 mg/dL — ABNORMAL HIGH (ref 70–99)
Glucose-Capillary: 144 mg/dL — ABNORMAL HIGH (ref 70–99)
Glucose-Capillary: 180 mg/dL — ABNORMAL HIGH (ref 70–99)

## 2019-08-13 LAB — BASIC METABOLIC PANEL
Anion gap: 10 (ref 5–15)
BUN: 25 mg/dL — ABNORMAL HIGH (ref 8–23)
CO2: 29 mmol/L (ref 22–32)
Calcium: 8.3 mg/dL — ABNORMAL LOW (ref 8.9–10.3)
Chloride: 97 mmol/L — ABNORMAL LOW (ref 98–111)
Creatinine, Ser: 0.87 mg/dL (ref 0.61–1.24)
GFR calc Af Amer: >60 mL/min (ref >60)
GFR calc non Af Amer: >60 mL/min (ref >60)
Glucose, Bld: 175 mg/dL — ABNORMAL HIGH (ref 70–99)
Potassium: 4 mmol/L (ref 3.5–5.1)
Sodium: 136 mmol/L (ref 135–145)

## 2019-08-13 LAB — CBC
HCT: 42.1 % (ref 39.0–52.0)
HCT: 45 % (ref 39.0–52.0)
Hemoglobin: 13.5 g/dL (ref 13.0–17.0)
Hemoglobin: 14.3 g/dL (ref 13.0–17.0)
MCH: 28.2 pg (ref 26.0–34.0)
MCH: 28.3 pg (ref 26.0–34.0)
MCHC: 32.1 g/dL (ref 30.0–36.0)
MCHC: 31.8 g/dL (ref 30.0–36.0)
MCV: 87.9 fL (ref 80.0–100.0)
MCV: 88.9 fL (ref 80.0–100.0)
Platelets: 213 10*3/uL (ref 150–400)
Platelets: 259 10*3/uL (ref 150–400)
RBC: 4.79 MIL/uL (ref 4.22–5.81)
RBC: 5.06 MIL/uL (ref 4.22–5.81)
RDW: 15.8 % — ABNORMAL HIGH (ref 11.5–15.5)
RDW: 15.9 % — ABNORMAL HIGH (ref 11.5–15.5)
WBC: 9.9 10*3/uL (ref 4.0–10.5)
WBC: 10.5 10*3/uL (ref 4.0–10.5)
nRBC: 0 % (ref 0.0–0.2)
nRBC: 0 % (ref 0.0–0.2)

## 2019-08-13 LAB — MAGNESIUM: Magnesium: 2.1 mg/dL (ref 1.7–2.4)

## 2019-08-13 LAB — HEPARIN LEVEL (UNFRACTIONATED)
Heparin Unfractionated: 0.74 IU/mL — ABNORMAL HIGH (ref 0.30–0.70)
Heparin Unfractionated: 0.66 IU/mL (ref 0.30–0.70)

## 2019-08-13 MED ORDER — OXYCODONE-ACETAMINOPHEN 5-325 MG PO TABS: 1.0000 | ORAL_TABLET | ORAL | Status: DC | PRN

## 2019-08-13 MED ORDER — TRAMADOL HCL 50 MG PO TABS: 50.0000 mg | ORAL_TABLET | Freq: Four times a day (QID) | ORAL | Status: DC | PRN

## 2019-08-13 MED ORDER — HEPARIN (PORCINE) 25000 UT/250ML-% IV SOLN
1800.0000 [IU]/h | INTRAVENOUS | Status: AC
  Administered 2019-08-13: 2000 [IU]/h via INTRAVENOUS
  Administered 2019-08-13 – 2019-08-14 (×2): 1800 [IU]/h via INTRAVENOUS
  Filled 2019-08-13 (×2): qty 250

## 2019-08-13 MED ORDER — OXYCODONE HCL 5 MG PO TABS
5.0000 mg | ORAL_TABLET | ORAL | Status: DC | PRN
  Administered 2019-08-13: 5 mg via ORAL
  Filled 2019-08-13: qty 1

## 2019-08-13 MED ORDER — OXYCODONE HCL 5 MG PO TABS
5.0000 mg | ORAL_TABLET | ORAL | Status: DC | PRN
  Administered 2019-08-13 – 2019-08-17 (×9): 10 mg via ORAL
  Filled 2019-08-13 (×9): qty 2

## 2019-08-13 MED ORDER — GABAPENTIN 300 MG PO CAPS
300.0000 mg | ORAL_CAPSULE | Freq: Three times a day (TID) | ORAL | Status: DC
  Administered 2019-08-13 – 2019-08-17 (×12): 300 mg via ORAL
  Filled 2019-08-13 (×12): qty 1

## 2019-08-13 NOTE — Progress Notes (Signed)
ANTICOAGULATION CONSULT NOTE - Initial Consult  Pharmacy Consult for Heparin  Indication: DVT  Allergies  Allergen Reactions  . Penicillins Anaphylaxis, Rash and Other (See Comments)  . Succinylcholine Chloride Anaphylaxis and Rash  . Keflex [Cephalexin] Rash  . Sulfa Antibiotics Rash and Other (See Comments)    Patient Measurements: Height: 6' 9"  (205.7 cm) Weight: (!) 359 lb 4.5 oz (163 kg) IBW/kg (Calculated) : 98.3 Heparin Dosing Weight: 135 kg   Vital Signs: Temp: 98.4 F (36.9 C) (03/29 0749) Temp Source: Oral (03/29 0749) BP: 126/72 (03/29 0749) Pulse Rate: 89 (03/29 0749)  Labs: Recent Labs    08/11/19 1645 08/11/19 1645 08/11/19 1847 08/12/19 0510 08/12/19 2257 08/13/19 0530 08/13/19 0842  HGB 15.1   < >  --  14.1  --  13.5  --   HCT 46.8  --   --  45.4  --  42.1  --   PLT 242  --   --  184  --  213  --   HEPARINUNFRC  --   --   --   --  1.05*  --  0.74*  CREATININE 0.71  --   --  0.70  --  0.87  --   TROPONINIHS 8  --  10  --   --   --   --    < > = values in this interval not displayed.    Estimated Creatinine Clearance: 148.7 mL/min (by C-G formula based on SCr of 0.87 mg/dL).   Medical History: Past Medical History:  Diagnosis Date  . 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 Diabetes ([place])   . GERD (gastroesophageal reflux disease)   . Hyperlipidemia   . Hypertension   . Obesity   . Sleep apnea     Medications:  Medications Prior to Admission  Medication Sig Dispense Refill Last Dose  . aspirin 81 MG tablet Take 1 tablet (81 mg total) by mouth daily. 30 tablet  Past Week at Unknown time  . carvedilol (COREG) 6.25 MG tablet TAKE 1 TABLET BY MOUTH TWICE A DAY WITH FOOD 60 tablet 0 Past Week at Unknown time  . DULoxetine (CYMBALTA) 30 MG capsule Take 30 mg by mouth daily.   Past Week at Unknown time  . enoxaparin (LOVENOX) 40 MG/0.4ML injection Inject 0.4 mLs (40 mg total) into the skin daily for 14 days.  5.6 mL 0 08/11/2019 at Unknown time  . Exenatide ER (BYDUREON) 2 MG PEN Inject 2 mg into the skin once a week.   Past Week at Unknown time  . gabapentin (NEURONTIN) 300 MG capsule Take 3 capsules (900 mg total) by mouth 3 (three) times daily.   Past Week at Unknown time  . Insulin Glargine (BASAGLAR KWIKPEN) 100 UNIT/ML Inject 80 Units into the skin daily.   Unknown at Unknown  . losartan (COZAAR) 100 MG tablet Take 1 tablet (100 mg total) by mouth daily. 30 tablet 0 Unknown at Unknown  . Melatonin 10 MG TABS Take 10 mg by mouth at bedtime as needed (sleep).   prn at prn  . metFORMIN (GLUCOPHAGE) 500 MG tablet Take 1,000 mg by mouth 2 (two) times daily with a meal.   Unknown at Unknown  . Multiple Vitamin (MULTIVITAMIN WITH MINERALS) TABS tablet Take 1 tablet by mouth daily.   Unknown at Unknown  . omeprazole (PRILOSEC) 20 MG capsule Take 20 mg by mouth daily.    Unknown at Unknown  . ondansetron (ZOFRAN)  4 MG tablet Take 1 tablet (4 mg total) by mouth every 8 (eight) hours as needed for nausea or vomiting. 30 tablet 0 prn at prn  . oxyCODONE (OXY IR/ROXICODONE) 5 MG immediate release tablet Take 1 tablet (5 mg total) by mouth every 4 (four) hours as needed. 40 tablet 0 prn at prn  . rosuvastatin (CRESTOR) 20 MG tablet Take 20 mg by mouth daily.   Unknown at Unknown  . Blood Glucose Monitoring Suppl (ONE TOUCH ULTRA SYSTEM KIT) w/Device KIT 1 kit by Does not apply route once. Dx. E11.9 1 each 0   . glucose blood test strip 1 each by Other route 4 (four) times daily. Use with One touch meter to check blood sugar. Dx E11.9 100 each 12   . Insulin Pen Needle (BD PEN NEEDLE NANO U/F) 32G X 4 MM MISC Inject 1 each as directed 3 (three) times daily. 300 each 3   . ONE TOUCH LANCETS MISC 1 each by Does not apply route 4 (four) times daily. Use with one touch meter to check blood sugar. Dx: E11.9 200 each 12     Assessment: Pharmacy consulted to dose heparin in this 64 year old male with newly diagnosed  DVT.  CrCl = 161.8 ml/min.   Pt received dose of lovenox 40 mg on 3/27 @ 2300.   Goal of Therapy:  Heparin level 0.3-0.7 units/ml Monitor platelets by anticoagulation protocol: Yes   Plan:  -03/29 0842 HL @ 0.74, supratherapeutic. Will decrease heparin rate to 1800 units/hr -Recheck HL 6 hours after rate change -Daily CBC per protocol  [place] Resident 08/13/2019,9:52 AM

## 2019-08-13 NOTE — Progress Notes (Signed)
[place] for Heparin  Indication: DVT  Allergies  Allergen Reactions  . Penicillins Anaphylaxis, Rash and Other (See Comments)  . Succinylcholine Chloride Anaphylaxis and Rash  . Keflex [Cephalexin] Rash  . Sulfa Antibiotics Rash and Other (See Comments)    Patient Measurements: Height: 6' 9"  (205.7 cm) Weight: (!) 359 lb 4.5 oz (163 kg) IBW/kg (Calculated) : 98.3 Heparin Dosing Weight: 135 kg   Vital Signs: Temp: 98 F (36.7 C) (03/29 1541) Temp Source: Oral (03/29 1541) BP: 100/63 (03/29 1136) Pulse Rate: 88 (03/29 1541)  Labs: Recent Labs    08/11/19 1645 08/11/19 1645 08/11/19 1847 08/12/19 0510 08/12/19 0510 08/12/19 2257 08/13/19 0530 08/13/19 0842 08/13/19 1547  HGB 15.1   < >  --  14.1   < >  --  13.5  --  14.3  HCT 46.8   < >  --  45.4  --   --  42.1  --  45.0  PLT 242   < >  --  184  --   --  213  --  259  HEPARINUNFRC  --   --   --   --   --  1.05*  --  0.74* 0.66  CREATININE 0.71  --   --  0.70  --   --  0.87  --   --   TROPONINIHS 8  --  10  --   --   --   --   --   --    < > = values in this interval not displayed.    Estimated Creatinine Clearance: 148.7 mL/min (by C-G formula based on SCr of 0.87 mg/dL).   Medical History: Past Medical History:  Diagnosis Date  . 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 Diabetes ([place])   . GERD (gastroesophageal reflux disease)   . Hyperlipidemia   . Hypertension   . Obesity   . Sleep apnea     Medications:  Medications Prior to Admission  Medication Sig Dispense Refill Last Dose  . aspirin 81 MG tablet Take 1 tablet (81 mg total) by mouth daily. 30 tablet  Past Week at Unknown time  . carvedilol (COREG) 6.25 MG tablet TAKE 1 TABLET BY MOUTH TWICE A DAY WITH FOOD 60 tablet 0 Past Week at Unknown time  . DULoxetine (CYMBALTA) 30 MG capsule Take 30 mg by mouth daily.   Past Week at Unknown time  . enoxaparin (LOVENOX) 40 MG/0.4ML  injection Inject 0.4 mLs (40 mg total) into the skin daily for 14 days. 5.6 mL 0 08/11/2019 at Unknown time  . Exenatide ER (BYDUREON) 2 MG PEN Inject 2 mg into the skin once a week.   Past Week at Unknown time  . gabapentin (NEURONTIN) 300 MG capsule Take 3 capsules (900 mg total) by mouth 3 (three) times daily.   Past Week at Unknown time  . Insulin Glargine (BASAGLAR KWIKPEN) 100 UNIT/ML Inject 80 Units into the skin daily.   Unknown at Unknown  . losartan (COZAAR) 100 MG tablet Take 1 tablet (100 mg total) by mouth daily. 30 tablet 0 Unknown at Unknown  . Melatonin 10 MG TABS Take 10 mg by mouth at bedtime as needed (sleep).   prn at prn  . metFORMIN (GLUCOPHAGE) 500 MG tablet Take 1,000 mg by mouth 2 (two) times daily with a meal.   Unknown at Unknown  . Multiple Vitamin (MULTIVITAMIN WITH MINERALS) TABS tablet Take 1  tablet by mouth daily.   Unknown at Unknown  . omeprazole (PRILOSEC) 20 MG capsule Take 20 mg by mouth daily.    Unknown at Unknown  . ondansetron (ZOFRAN) 4 MG tablet Take 1 tablet (4 mg total) by mouth every 8 (eight) hours as needed for nausea or vomiting. 30 tablet 0 prn at prn  . oxyCODONE (OXY IR/ROXICODONE) 5 MG immediate release tablet Take 1 tablet (5 mg total) by mouth every 4 (four) hours as needed. 40 tablet 0 prn at prn  . rosuvastatin (CRESTOR) 20 MG tablet Take 20 mg by mouth daily.   Unknown at Unknown  . Blood Glucose Monitoring Suppl (ONE TOUCH ULTRA SYSTEM KIT) w/Device KIT 1 kit by Does not apply route once. Dx. E11.9 1 each 0   . glucose blood test strip 1 each by Other route 4 (four) times daily. Use with One touch meter to check blood sugar. Dx E11.9 100 each 12   . Insulin Pen Needle (BD PEN NEEDLE NANO U/F) 32G X 4 MM MISC Inject 1 each as directed 3 (three) times daily. 300 each 3   . ONE TOUCH LANCETS MISC 1 each by Does not apply route 4 (four) times daily. Use with one touch meter to check blood sugar. Dx: E11.9 200 each 12     Assessment: Pharmacy  consulted to dose heparin in this 64 year old male with newly diagnosed DVT.  CrCl = 161.8 ml/min.   Pt received dose of lovenox 40 mg on 3/27 @ 2300.   Goal of Therapy:  Heparin level 0.3-0.7 units/ml Monitor platelets by anticoagulation protocol: Yes   Plan:  3/29 1547 HL 0.66 therapeutic. Continue heparin drip at 1800 units/hr. Recheck HL at 2200 to confirm. CBC daily.  Dorena Bodo, PharmD 08/13/2019,4:52 PM

## 2019-08-13 NOTE — Progress Notes (Signed)
ANTICOAGULATION CONSULT NOTE - Initial Consult  Pharmacy Consult for Heparin  Indication: DVT  Allergies  Allergen Reactions  . Penicillins Anaphylaxis, Rash and Other (See Comments)  . Succinylcholine Chloride Anaphylaxis and Rash  . Keflex [Cephalexin] Rash  . Sulfa Antibiotics Rash and Other (See Comments)    Patient Measurements: Height: 6' 9" (205.7 cm) Weight: (!) 360 lb 4.8 oz (163.4 kg) IBW/kg (Calculated) : 98.3 Heparin Dosing Weight: 135 kg   Vital Signs: Temp: 98 F (36.7 C) (03/28 1933) BP: 137/65 (03/28 1933) Pulse Rate: 91 (03/28 1933)  Labs: Recent Labs    08/10/19 0559 08/10/19 0559 08/11/19 1645 08/11/19 1847 08/12/19 0510 08/12/19 2257  HGB 14.2   < > 15.1  --  14.1  --   HCT 45.4  --  46.8  --  45.4  --   PLT 245  --  242  --  184  --   HEPARINUNFRC  --   --   --   --   --  1.05*  CREATININE  --   --  0.71  --  0.70  --   TROPONINIHS  --   --  8 10  --   --    < > = values in this interval not displayed.    Estimated Creatinine Clearance: 161.8 mL/min (by C-G formula based on SCr of 0.7 mg/dL).   Medical History: Past Medical History:  Diagnosis Date  . 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 Diabetes ([place])   . GERD (gastroesophageal reflux disease)   . Hyperlipidemia   . Hypertension   . Obesity   . Sleep apnea     Medications:  Medications Prior to Admission  Medication Sig Dispense Refill Last Dose  . aspirin 81 MG tablet Take 1 tablet (81 mg total) by mouth daily. 30 tablet  Past Week at Unknown time  . carvedilol (COREG) 6.25 MG tablet TAKE 1 TABLET BY MOUTH TWICE A DAY WITH FOOD 60 tablet 0 Past Week at Unknown time  . DULoxetine (CYMBALTA) 30 MG capsule Take 30 mg by mouth daily.   Past Week at Unknown time  . enoxaparin (LOVENOX) 40 MG/0.4ML injection Inject 0.4 mLs (40 mg total) into the skin daily for 14 days. 5.6 mL 0 08/11/2019 at Unknown time  . Exenatide ER (BYDUREON) 2 MG PEN Inject 2  mg into the skin once a week.   Past Week at Unknown time  . gabapentin (NEURONTIN) 300 MG capsule Take 3 capsules (900 mg total) by mouth 3 (three) times daily.   Past Week at Unknown time  . Insulin Glargine (BASAGLAR KWIKPEN) 100 UNIT/ML Inject 80 Units into the skin daily.   Unknown at Unknown  . losartan (COZAAR) 100 MG tablet Take 1 tablet (100 mg total) by mouth daily. 30 tablet 0 Unknown at Unknown  . Melatonin 10 MG TABS Take 10 mg by mouth at bedtime as needed (sleep).   prn at prn  . metFORMIN (GLUCOPHAGE) 500 MG tablet Take 1,000 mg by mouth 2 (two) times daily with a meal.   Unknown at Unknown  . Multiple Vitamin (MULTIVITAMIN WITH MINERALS) TABS tablet Take 1 tablet by mouth daily.   Unknown at Unknown  . omeprazole (PRILOSEC) 20 MG capsule Take 20 mg by mouth daily.    Unknown at Unknown  . ondansetron (ZOFRAN) 4 MG tablet Take 1 tablet (4 mg total) by mouth every 8 (eight) hours as needed for nausea or vomiting.  30 tablet 0 prn at prn  . oxyCODONE (OXY IR/ROXICODONE) 5 MG immediate release tablet Take 1 tablet (5 mg total) by mouth every 4 (four) hours as needed. 40 tablet 0 prn at prn  . rosuvastatin (CRESTOR) 20 MG tablet Take 20 mg by mouth daily.   Unknown at Unknown  . Blood Glucose Monitoring Suppl (ONE TOUCH ULTRA SYSTEM KIT) w/Device KIT 1 kit by Does not apply route once. Dx. E11.9 1 each 0   . glucose blood test strip 1 each by Other route 4 (four) times daily. Use with One touch meter to check blood sugar. Dx E11.9 100 each 12   . Insulin Pen Needle (BD PEN NEEDLE NANO U/F) 32G X 4 MM MISC Inject 1 each as directed 3 (three) times daily. 300 each 3   . ONE TOUCH LANCETS MISC 1 each by Does not apply route 4 (four) times daily. Use with one touch meter to check blood sugar. Dx: E11.9 200 each 12     Assessment: Pharmacy consulted to dose heparin in this 64 year old male with newly diagnosed DVT.  CrCl = 161.8 ml/min.   Pt received dose of lovenox 40 mg on 3/27 @ 2300.    Goal of Therapy:  Heparin level 0.3-0.7 units/ml Monitor platelets by anticoagulation protocol: Yes   Plan:  03/28 2300 HL @ 1.05 supratherapeutic. Will hold drip for one hour and restart at 0215 at 2000 units/hr and will recheck HL at 0800 and continue to monitor.  Tobie Lords, PharmD, BCPS Clinical Pharmacist 08/13/2019,1:04 AM

## 2019-08-13 NOTE — Progress Notes (Signed)
Progress Note  Patient Name: Nathan Dean Date of Encounter: 08/13/2019  Primary Cardiologist: Dr. Elease Hashimoto  Subjective   Doing ok, denies cp or sob. Has right shoulder pain  Inpatient Medications    Scheduled Meds: . aspirin EC  81 mg Oral Daily  . carvedilol  6.25 mg Oral BID WC  . DULoxetine  30 mg Oral Daily  . furosemide  40 mg Intravenous Q12H  . gabapentin  900 mg Oral TID  . guaiFENesin  600 mg Oral BID  . insulin aspart  0-15 Units Subcutaneous TID PC & HS  . insulin glargine  80 Units Subcutaneous Daily  . losartan  100 mg Oral Daily  . multivitamin with minerals  1 tablet Oral Daily  . pantoprazole  40 mg Oral Daily  . polyethylene glycol  17 g Oral Daily  . rosuvastatin  20 mg Oral Daily  . senna-docusate  1 tablet Oral BID   Continuous Infusions: . heparin 2,000 Units/hr (08/13/19 0232)   PRN Meds: acetaminophen **OR** acetaminophen, bisacodyl, chlorpheniramine-HYDROcodone, labetalol, magnesium hydroxide, melatonin, morphine injection, ondansetron **OR** ondansetron (ZOFRAN) IV, oxyCODONE-acetaminophen, sodium phosphate, traZODone   Vital Signs    Vitals:   08/12/19 1657 08/12/19 1933 08/13/19 0604 08/13/19 0749  BP: 136/66 137/65 136/82 126/72  Pulse: 80 91 95 89  Resp: 17 20 18 18   Temp: 97.8 F (36.6 C) 98 F (36.7 C) 98.7 F (37.1 C) 98.4 F (36.9 C)  TempSrc:   Oral Oral  SpO2: 96% 91% 91% 93%  Weight:   (!) 163 kg   Height:        Intake/Output Summary (Last 24 hours) at 08/13/2019 1039 Last data filed at 08/13/2019 0600 Gross per 24 hour  Intake 789.13 ml  Output 1550 ml  Net -760.87 ml   Last 3 Weights 08/13/2019 08/12/2019 08/11/2019  Weight (lbs) 359 lb 4.5 oz 360 lb 4.8 oz 360 lb  Weight (kg) 162.97 kg 163.431 kg 163.295 kg      Telemetry    Sinus rhythm - Personally Reviewed  ECG    No new tracing obtained - Personally Reviewed  Physical Exam   GEN: right shoulder discomfort.   Neck: No JVD Cardiac: RRR, no murmurs,  rubs, or gallops.  Respiratory: Clear to auscultation bilaterally. GI: Soft, nontender, non-distended  MS: trace edema; No deformity. Neuro:  Nonfocal  Psych: Normal affect   Labs    High Sensitivity Troponin:   Recent Labs  Lab 08/11/19 1645 08/11/19 1847  TROPONINIHS 8 10      Chemistry Recent Labs  Lab 08/11/19 1645 08/12/19 0510 08/13/19 0530  NA 139 139 136  K 4.3 3.9 4.0  CL 101 102 97*  CO2 31 29 29   GLUCOSE 237* 157* 175*  BUN 18 19 25*  CREATININE 0.71 0.70 0.87  CALCIUM 9.0 8.5* 8.3*  PROT 6.7  --   --   ALBUMIN 3.5  --   --   AST 22  --   --   ALT 18  --   --   ALKPHOS 68  --   --   BILITOT 0.9  --   --   GFRNONAA >60 >60 >60  GFRAA >60 >60 >60  ANIONGAP 7 8 10      Hematology Recent Labs  Lab 08/11/19 1645 08/12/19 0510 08/13/19 0530  WBC 11.8* 10.2 9.9  RBC 5.36 4.99 4.79  HGB 15.1 14.1 13.5  HCT 46.8 45.4 42.1  MCV 87.3 91.0 87.9  MCH 28.2 28.3  28.2  MCHC 32.3 31.1 32.1  RDW 15.2 15.6* 15.8*  PLT 242 184 213    BNP Recent Labs  Lab 08/08/19 1350 08/11/19 1645  BNP 152.0* 62.0     DDimer No results for input(s): DDIMER in the last 168 hours.   Radiology    CT Angio Chest PE W/Cm &/Or Wo Cm  Result Date: 08/11/2019 CLINICAL DATA:  Near syncopal episode. EXAM: CT ANGIOGRAPHY CHEST WITH CONTRAST TECHNIQUE: Multidetector CT imaging of the chest was performed using the standard protocol during bolus administration of intravenous contrast. Multiplanar CT image reconstructions and MIPs were obtained to evaluate the vascular anatomy. CONTRAST:  OMNIPAQUE IOHEXOL 350 MG/ML SOLN COMPARISON:  Chest radiograph dated 08/11/2019 and CT chest dated 10/22/2018. FINDINGS: Cardiovascular: Satisfactory opacification of the pulmonary arteries to the segmental level. No evidence of pulmonary embolism. Vascular calcifications are seen in the aortic arch. The heart is mildly enlarged. No pericardial effusion. Mediastinum/Nodes: No enlarged  mediastinal, hilar, or axillary lymph nodes. Thyroid gland, trachea, and esophagus demonstrate no significant findings. Lungs/Pleura: There is moderate right lower and right middle lobe atelectasis. Mild ground-glass opacities are seen in the bilateral upper lobes, similar to prior exam. There is no pleural effusion or pneumothorax. Upper Abdomen: No acute abnormality. Musculoskeletal: No chest wall abnormality. No acute or significant osseous findings. Review of the MIP images confirms the above findings. IMPRESSION: 1. No evidence of pulmonary embolism. 2. Cardiomegaly and mild bilateral upper lobe ground-glass opacities likely reflect pulmonary edema. Moderate right lower and right middle lobe atelectasis. Aortic Atherosclerosis (ICD10-I70.0). Electronically Signed   By: Romona Curls M.D.   On: 08/11/2019 18:30   US Venous Img Lower Bilateral (DVT)  Result Date: 08/12/2019 CLINICAL DATA:  Bilateral lower extremity edema. EXAM: BILATERAL LOWER EXTREMITY VENOUS DOPPLER ULTRASOUND TECHNIQUE: Gray-scale sonography with graded compression, as well as color Doppler and duplex ultrasound were performed to evaluate the lower extremity deep venous systems from the level of the common femoral vein and including the common femoral, femoral, profunda femoral, popliteal and calf veins including the posterior tibial, peroneal and gastrocnemius veins when visible. The superficial great saphenous vein was also interrogated. Spectral Doppler was utilized to evaluate flow at rest and with distal augmentation maneuvers in the common femoral, femoral and popliteal veins. COMPARISON:  None. FINDINGS: RIGHT LOWER EXTREMITY Common Femoral Vein: No evidence of thrombus. Normal compressibility, respiratory phasicity and response to augmentation. Saphenofemoral Junction: No evidence of thrombus. Normal compressibility and flow on color Doppler imaging. Profunda Femoral Vein: No evidence of thrombus. Normal compressibility and flow on  color Doppler imaging. Femoral Vein: No evidence of thrombus. Normal compressibility, respiratory phasicity and response to augmentation. Popliteal Vein: No evidence of thrombus. Normal compressibility, respiratory phasicity and response to augmentation. Calf Veins: There appears to be likely occlusive thrombus in the right posterior tibial vein. The right peroneal vein is normally patent. Superficial Great Saphenous Vein: No evidence of thrombus. Normal compressibility. Venous Reflux:  None. Other Findings: No evidence of superficial thrombophlebitis or abnormal fluid collection. LEFT LOWER EXTREMITY Common Femoral Vein: No evidence of thrombus. Normal compressibility, respiratory phasicity and response to augmentation. Saphenofemoral Junction: No evidence of thrombus. Normal compressibility and flow on color Doppler imaging. Profunda Femoral Vein: No evidence of thrombus. Normal compressibility and flow on color Doppler imaging. Femoral Vein: No evidence of thrombus. Normal compressibility, respiratory phasicity and response to augmentation. Popliteal Vein: No evidence of thrombus. Normal compressibility, respiratory phasicity and response to augmentation. Calf Veins: No evidence of thrombus.  Normal compressibility and flow on color Doppler imaging. Superficial Great Saphenous Vein: No evidence of thrombus. Normal compressibility. Venous Reflux:  None. Other Findings: No evidence of superficial thrombophlebitis or abnormal fluid collection. IMPRESSION: Deep vein thrombus isolated to the right posterior tibial vein in the calf. Other deep veins in both lower extremities are normally patent. Electronically Signed   By: Irish LackGlenn  Yamagata M.D.   On: 08/12/2019 12:03   DG Chest Port 1 View  Result Date: 08/11/2019 CLINICAL DATA:  Syncope. EXAM: PORTABLE CHEST 1 VIEW COMPARISON:  August 08, 2019 FINDINGS: There is persistent elevation of the right hemidiaphragm with a right lower lung zone airspace opacity. There is no  pneumothorax. No large pleural effusion. No acute osseous abnormality. IMPRESSION: Persistent elevation of the right hemidiaphragm with a right lower lung zone airspace opacity which may represent atelectasis or infiltrate. Electronically Signed   By: Katherine Mantlehristopher  Green M.D.   On: 08/11/2019 17:24   ECHOCARDIOGRAM COMPLETE  Result Date: 08/12/2019    ECHOCARDIOGRAM REPORT   Patient Name:   Wende MottGREGORY Whitcomb Date of Exam: 08/12/2019 Medical Rec #:  [id number]       Height:       81.0 in Accession #:    [id number]      Weight:       360.3 lb Date of Birth:  02/06/1956        BSA:          2.979 m Patient Age:    64 years        BP:           119/76 mmHg Patient Gender: M               HR:           91 bpm. Exam Location:  [facility] Procedure: 2D Echo and Intracardiac Opacification Agent Indications:     Diastolic CHF  History:         Patient has prior history of Echocardiogram examinations. Risk                  Factors:Hypertension, Diabetes and Dyslipidemia.  Sonographer:     LTM Referring Phys:  [id number] Vernetta HoneyJAN A MANSY Diagnosing Phys: Julien Nordmannimothy Gollan MD IMPRESSIONS  1. Left ventricular ejection fraction, by estimation, is 60 to 65%. The left ventricle has normal function. The left ventricle has no regional wall motion abnormalities. There is mild left ventricular hypertrophy. Left ventricular diastolic parameters are consistent with Grade I diastolic dysfunction (impaired relaxation).  2. Right ventricular systolic function is normal. The right ventricular size is normal. There is mildly elevated pulmonary artery systolic pressure. FINDINGS  Left Ventricle: Left ventricular ejection fraction, by estimation, is 60 to 65%. The left ventricle has normal function. The left ventricle has no regional wall motion abnormalities. The left ventricular internal cavity size was normal in size. There is  mild left ventricular hypertrophy. Left ventricular diastolic parameters are consistent with Grade I diastolic dysfunction (impaired  relaxation). Right Ventricle: The right ventricular size is normal. No increase in right ventricular wall thickness. Right ventricular systolic function is normal. There is mildly elevated pulmonary artery systolic pressure. The tricuspid regurgitant velocity is 2.58  m/s, and with an assumed right atrial pressure of 5 mmHg, the estimated right ventricular systolic pressure is 31.6 mmHg. Left Atrium: Left atrial size was normal in size. Right Atrium: Right atrial size was normal in size. Pericardium: There is no evidence of pericardial effusion. Mitral Valve: The mitral valve was  not well visualized. Normal mobility of the mitral valve leaflets. No evidence of mitral valve regurgitation. No evidence of mitral valve stenosis. Tricuspid Valve: The tricuspid valve is not well visualized. Tricuspid valve regurgitation is trivial. No evidence of tricuspid stenosis. Aortic Valve: The aortic valve was not well visualized. Aortic valve regurgitation is not visualized. Mild aortic valve sclerosis is present, with no evidence of aortic valve stenosis. Aortic valve mean gradient measures 10.0 mmHg. Aortic valve peak gradient measures 15.4 mmHg. Aortic valve area, by VTI measures 1.73 cm. Pulmonic Valve: The pulmonic valve was not well visualized. Pulmonic valve regurgitation is not visualized. No evidence of pulmonic stenosis. Aorta: The aortic root is normal in size and structure and the aortic root was not well visualized. Venous: The pulmonary veins were not well visualized. The inferior vena cava is normal in size with greater than 50% respiratory variability, suggesting right atrial pressure of 3 mmHg. IAS/Shunts: No atrial level shunt detected by color flow Doppler.  LEFT VENTRICLE PLAX 2D LVIDd:         4.07 cm  Diastology LVIDs:         3.36 cm  LV e' lateral:   5.87 cm/s LV PW:         1.46 cm  LV E/e' lateral: 11.1 LV IVS:        1.38 cm  LV e' medial:    5.66 cm/s LVOT diam:     1.90 cm  LV E/e' medial:  11.5 LV  SV:         54 LV SV Index:   18 LVOT Area:     2.84 cm  RIGHT VENTRICLE RV S prime:     19.70 cm/s TAPSE (M-mode): 2.3 cm LEFT ATRIUM         Index LA diam:    3.90 cm 1.31 cm/m  AORTIC VALVE                    PULMONIC VALVE AV Area (Vmax):    1.27 cm     PV Vmax:       1.51 m/s AV Area (Vmean):   1.22 cm     PV Vmean:      90.000 cm/s AV Area (VTI):     1.73 cm     PV VTI:        0.234 m AV Vmax:           196.00 cm/s  PV Peak grad:  9.1 mmHg AV Vmean:          145.000 cm/s PV Mean grad:  4.0 mmHg AV VTI:            0.316 m AV Peak Grad:      15.4 mmHg AV Mean Grad:      10.0 mmHg LVOT Vmax:         87.90 cm/s LVOT Vmean:        62.200 cm/s LVOT VTI:          0.192 m LVOT/AV VTI ratio: 0.61  AORTA Ao Root diam: 3.40 cm MITRAL VALVE                TRICUSPID VALVE MV Area (PHT): 3.06 cm     TR Peak grad:   26.6 mmHg MV E velocity: 65.00 cm/s   TR Vmax:        258.00 cm/s MV A velocity: 104.00 cm/s MV E/A ratio:  0.62  SHUNTS                             Systemic VTI:  0.19 m                             Systemic Diam: 1.90 cm Julien Nordmann MD Electronically signed by Julien Nordmann MD Signature Date/Time: 08/12/2019/1:27:53 PM    Final     Cardiac Studies   TTE 08/12/2018 1. Left ventricular ejection fraction, by estimation, is 60 to 65%. The  left ventricle has normal function. The left ventricle has no regional  wall motion abnormalities. There is mild left ventricular hypertrophy.  Left ventricular diastolic parameters  are consistent with Grade I diastolic dysfunction (impaired relaxation).  2. Right ventricular systolic function is normal. The right ventricular  size is normal. There is mildly elevated pulmonary artery systolic  pressure.   Patient Profile     64 y.o. male hx of provoked DVT 18yrs ago after right knee surgery, OSA, obesity, dm being seen for volume overload  Assessment & Plan    1. Edema, diastolic dysfunction -Obesity, diet non compliance contributing. -cont  gentle diuresing with lasix. po lasix 40mg  po on discharge will be needed in addition to close outpatient follow up -no further cardiac intervention nor testing planned  2. RLE DVT -on heparin -mgmt as per primary team  3. Right shoulder pain -as per sx team  Please let know if further cardiac input is needed. Thank you    Signed, [name], MD  08/13/2019, 10:39 AM

## 2019-08-13 NOTE — Progress Notes (Addendum)
[facility] Progress Note  Patient: Nathan Dean    QIO:[id number]  DOA: 08/11/2019     Date of Service: the patient was seen and examined on 08/13/2019  Chief Complaint  Patient presents with  . Near Syncope   Brief hospital course: Past medical history of HTN, HLD, type II DM, OSA noncompliant with CPAP.  Presented with complaints of near syncope while having a bowel movement.  Recent hospitalization for rotator cuff tear repair requiring SNF, discharged on 08/09/2019. Currently further plan is further work-up for acute respiratory failure and treatment.  Assessment and Plan: 1.  Acute on chronic hypoxic respiratory failure Acute pulmonary edema/acute on chronic HFpEF Hypertensive urgency OSA, noncompliant with CPAP Possible healthcare associated/aspiration pneumonia-ruled out Concern for sepsis POA given tachycardia and leukocytosis as well as fever-ruled out Patient presents with complaints of a near syncopal event while having a bowel movement. On arrival he was found to have elevated blood pressure, tachycardia and low-grade temp.  Requiring 5 LPM to maintain adequate saturation. At present I was able to wean him down to 2 LPM.  CT PE protocol was performed which is negative for pulmonary embolism. There is also no evidence of pneumonia and procalcitonin level is negative. EKG does not show any evidence of acute ischemia. Serial troponins are negative as well. Patient was started on IV Lasix. Continue  Holding Coreg and Cozaar in the setting of hypotension Echocardiogram shows no acute abnormality. Continue to monitor on telemetry. Cardiology currently signed off. Patient is started on IV Levaquin. Stop as less likely infection. Follow-up on infectious work-up.  2.  Near syncope Constipation Near syncope in the setting of attempting bowel movement. Syncope work-up was initiated. Continue bowel regimen. Monitor response.  3.  OSA compliant with CPAP. Pt was  using CPAP religiously every night at home. At SNF it was no setup well per pt.  Currently on CPAP nightly. Monitor improvement.  4.  Type 2 diabetes mellitus, uncontrolled with hyperglycemia with peripheral neuropathy Continue sliding scale insulin.  Continue Neurontin.  5.  Hyperlipidemia Continue statin  6.  Recent shoulder surgery Continue oxycodone per home regimen. As needed morphine for severe pain  7.  GERD. Continue PPI.  8. Body mass index is 38.5 kg/m.   9.  Lower extremity Doppler is positive for distal DVT. Given patient's increased risk for clot propagation shared decision-making process was initiated and patient was recommended to initiate anticoagulation. Orthopedic also cleared the patient to resume anticoagulation on therapeutic basis. Currently patient is on IV heparin tolerating it well. Patient would like wife to be part of the discussion when it comes to transition to oral anticoagulation.  Diet: Cardiac and carb modified diet DVT Prophylaxis: Subcutaneous Lovenox   Advance goals of care discussion: Full code  Family Communication: No family was present at bedside, at the time of interview.   Disposition:  Pt is from SNF (not from home), admitted with near syncope found to have acute on chronic hypoxic respiratory failure requiring 5 LPM, still has hypoxia and respiratory distress, which precludes a safe discharge. Discharge to SNF, when medically stable.  Subjective: Continues to have shortness of breath no nausea no vomiting.  Reports fatigue and tiredness.  Reports uncontrolled pain.  No bowel movement or passing gas.  Physical Exam: General:  alert oriented to time, place, and person.  Appear in moderate distress, affect appropriate Eyes: PERRL ENT: Oral Mucosa Clear, moist  Neck: difficult to assess  JVD,  Cardiovascular: S1 and S2 Present, no  Murmur,  Respiratory: increased respiratory effort, Bilateral Air entry equal and Decreased,  bilateral  Crackles, no wheezes Abdomen: Bowel Sound present, Soft and no tenderness,  Skin: no rash Extremities: bilateral  Pedal edema, no calf tenderness Neurologic: without any new focal findings Gait not checked due to patient safety concerns  Vitals:   08/13/19 0749 08/13/19 1136 08/13/19 1541 08/13/19 1915  BP: 126/72 100/63  (!) 106/92  Pulse: 89 85 88 88  Resp: 18 18 17 19   Temp: 98.4 F (36.9 C) 98.2 F (36.8 C) 98 F (36.7 C) 98.6 F (37 C)  TempSrc: Oral Oral Oral   SpO2: 93% 96% 97% 96%  Weight:      Height:        Intake/Output Summary (Last 24 hours) at 08/13/2019 2031 Last data filed at 08/13/2019 1920 Gross per 24 hour  Intake 69.13 ml  Output 950 ml  Net -880.87 ml   Filed Weights   08/11/19 1645 08/12/19 0200 08/13/19 0604  Weight: (!) 163.3 kg (!) 163.4 kg (!) 163 kg    Data Reviewed: I have personally reviewed and interpreted daily labs, tele strips, imagings as discussed above. I reviewed all nursing notes, pharmacy notes, vitals, pertinent old records I have discussed plan of care as described above with RN and patient/family.  CBC: Recent Labs  Lab 08/10/19 0559 08/11/19 1645 08/12/19 0510 08/13/19 0530 08/13/19 1547  WBC 8.9 11.8* 10.2 9.9 10.5  NEUTROABS  --  9.3*  --   --   --   HGB 14.2 15.1 14.1 13.5 14.3  HCT 45.4 46.8 45.4 42.1 45.0  MCV 88.7 87.3 91.0 87.9 88.9  PLT 245 242 184 213 259   Basic Metabolic Panel: Recent Labs  Lab 08/08/19 0553 08/09/19 0519 08/11/19 1645 08/12/19 0510 08/13/19 0530  NA 141 142 139 139 136  K 4.2 3.7 4.3 3.9 4.0  CL 106 108 101 102 97*  CO2 28 29 31 29 29   GLUCOSE 215* 107* 237* 157* 175*  BUN 13 16 18 19  25*  CREATININE 0.63 0.80 0.71 0.70 0.87  CALCIUM 8.7* 8.4* 9.0 8.5* 8.3*  MG  --   --   --   --  2.1    Studies: No results found.  Scheduled Meds: . aspirin EC  81 mg Oral Daily  . DULoxetine  30 mg Oral Daily  . furosemide  40 mg Intravenous Q12H  . gabapentin  300 mg Oral  TID  . guaiFENesin  600 mg Oral BID  . insulin aspart  0-15 Units Subcutaneous TID PC & HS  . insulin glargine  80 Units Subcutaneous Daily  . multivitamin with minerals  1 tablet Oral Daily  . pantoprazole  40 mg Oral Daily  . polyethylene glycol  17 g Oral Daily  . rosuvastatin  20 mg Oral Daily  . senna-docusate  1 tablet Oral BID   Continuous Infusions: . heparin 1,800 Units/hr (08/13/19 1533)   PRN Meds: acetaminophen **OR** acetaminophen, bisacodyl, chlorpheniramine-HYDROcodone, labetalol, magnesium hydroxide, melatonin, morphine injection, ondansetron **OR** ondansetron (ZOFRAN) IV, [DISCONTINUED] oxyCODONE-acetaminophen **AND** oxyCODONE, sodium phosphate, traZODone  Time spent: 35 minutes  Author: , MD [facility] 08/13/2019 8:31 PM  To reach On-call, see care teams to locate the attending and reach out to them via [email or web address]. If 7PM-7AM, please contact night-coverage If you still have difficulty reaching the attending provider, please page the [facility] (Director on Call) for [facility] on amion for assistance.

## 2019-08-13 NOTE — Evaluation (Signed)
Physical Therapy Evaluation Patient Details Name: Shulem Mader MRN: [id number] DOB: 1955-11-14 Today's Date: 08/13/2019   History of Present Illness  Per MD notes: Pt is 64 yo male who presented to the emergency room with acute onset of presyncope while having a bowel movement.  MD assessment includes acute on chronic hypoxic respiratory failure, acute pulmonary edema/acute on chronic HFpEF, hypertensive urgency, near syncope, and RLE DVT.  Of note pt with recent R shoulder arthroscopy 08/07/19 with subacromial decompression and distal clavicle excision, biceps tenotomy, and rotator cuff repair.  PMH also includes OSA, HTN, GERD, DM, and TKA.    Clinical Impression  Pt pleasant and motivated to participate during the session. Pt found with supplemental O2 off with a SpO2 in the mid to high 80's, O2 reapplied. Additionally, pt found with RUE sling off, Polar care off, external urinary catheter off, a telemetry lead off, and needing hygiene care. With bed mobility, pt required assistance ranging from min to max +2 for supine to/from sit and rolling. When sitting EOB, pt had significant difficulty with squaring himself off to sit evenly and needed physical assistance to keep an upright posture >75% of the time (ranged from CGA to mod A). Pt required mod assistance to help initiate lateral scooting EOB to assist with bed positioning. When sitting EOB, pt reported symptoms of dizziness and lightheadedness that got progressively worse. Pt was returned to supine and symptoms resolved. Initial supine BP was 113/64 and sitting EOB BP was 82/41 - nsg notified. Pt will benefit from PT services in a SNF setting upon discharge to safely address deficits listed in patient problem list for decreased caregiver assistance and eventual return to PLOF.     Follow Up Recommendations SNF    Equipment Recommendations  3in1 (PT)(bariatric 3 in 1)    Recommendations for Other Services       Precautions / Restrictions  Precautions Precautions: Fall;Shoulder Shoulder Interventions: Shoulder sling/immobilizer;Shoulder abduction pillow;At all times Required Braces or Orthoses: Sling Restrictions Weight Bearing Restrictions: Yes RUE Weight Bearing: Non weight bearing Other Position/Activity Restrictions: Per recent admission: Per MD, prefers pt to be NWB to RUE, but would permit a "light lean" of R elbow on platform walker      Mobility  Bed Mobility Overal bed mobility: Needs Assistance Bed Mobility: Sit to Supine;Rolling Rolling: +2 for physical assistance;+2 for safety/equipment;Mod assist   Supine to sit: HOB elevated;Min assist;+2 for physical assistance;+2 for safety/equipment;Mod assist Sit to supine: Min assist;HOB elevated;+2 for physical assistance;+2 for safety/equipment;Mod assist   General bed mobility comments: For supine to sit, pt required min-mod assist for his trunk. For sit to supine, pt required min-mod assist for LE's. Pt required cueing and bed adjustments (reverse trendelenberg) for bed positioning.  Transfers                 General transfer comment: Not attempted secondary to orthostatic response sitting EOB  Ambulation/Gait                Stairs            Wheelchair Mobility    Modified Rankin (Stroke Patients Only)       Balance Overall balance assessment: Needs assistance Sitting-balance support: Feet supported Sitting balance-Leahy Scale: Poor Sitting balance - Comments: Pt had difficulty maintaining ind sitting balance with BUE support and required assistance that ranged from CGA to mod A to maintain upright sitting posture Postural control: Posterior lean     Standing balance comment: Unable/unsafe to attempt  at this time                             Pertinent Vitals/Pain Pain Assessment: 0-10 Pain Location: R shoulder Pain Descriptors / Indicators: Aching;Sore;Grimacing;Moaning Pain Intervention(s): Monitored during  session;Premedicated before session    Home Living Family/patient expects to be discharged to:: Private residence Living Arrangements: Spouse/significant other Available Help at Discharge: Family;Available PRN/intermittently(Wife works during the day) Type of Home: House Home Access: Stairs to enter Entrance Stairs-Rails: Right;Left;Can reach both Secretary/administrator of Steps: 3-4 Home Layout: One level Home Equipment: Toilet riser;Walker - 2 wheels;Other (comment);Cane - quad Additional Comments: owns tripod cane    Prior Function Level of Independence: Independent with assistive device(s)         Comments: Patient reports he was MOD I for BADLs and did not use AD for functional mobility within home.  Increasing amount of falls in the past 2 weeks.  uses tripod cane for community mobility.     Hand Dominance   Dominant Hand: Right    Extremity/Trunk Assessment   Upper Extremity Assessment RUE Deficits / Details: s/p surgery, in sling at all times    Lower Extremity Assessment Lower Extremity Assessment: Generalized weakness       Communication   Communication: No difficulties  Cognition Arousal/Alertness: Awake/alert Behavior During Therapy: WFL for tasks assessed/performed Overall Cognitive Status: Within Functional Limits for tasks assessed                                        General Comments      Exercises Other Exercises Other Exercises: Rolling, bed positioning, and lateral scooting EOB training and practice   Assessment/Plan    PT Assessment Patient needs continued PT services  PT Problem List Decreased strength;Decreased mobility;Decreased range of motion;Decreased activity tolerance;Decreased balance;Decreased knowledge of use of DME;Pain;Decreased knowledge of precautions;Decreased safety awareness       PT Treatment Interventions DME instruction;Therapeutic activities;Gait training;Therapeutic exercise;Stair training;Balance  training;Functional mobility training;Neuromuscular re-education;Patient/family education    PT Goals (Current goals can be found in the Care Plan section)  Acute Rehab PT Goals Patient Stated Goal: Get stronger PT Goal Formulation: With patient Time For Goal Achievement: 08/26/19 Potential to Achieve Goals: Good    Frequency 7X/week   Barriers to discharge Decreased caregiver support;Inaccessible home environment      Co-evaluation PT/OT/SLP Co-Evaluation/Treatment: Yes Reason for Co-Treatment: To address functional/ADL transfers;For patient/therapist safety;Other (comment)(For donning/doffing sling) PT goals addressed during session: Mobility/safety with mobility(bed mobility, percautions, lateral scootching, bed positioning) OT goals addressed during session: ADL's and self-care;Proper use of Adaptive equipment and DME       AM-PAC PT "6 Clicks" Mobility  Outcome Measure Help needed turning from your back to your side while in a flat bed without using bedrails?: A Lot Help needed moving from lying on your back to sitting on the side of a flat bed without using bedrails?: A Lot Help needed moving to and from a bed to a chair (including a wheelchair)?: Total Help needed standing up from a chair using your arms (e.g., wheelchair or bedside chair)?: Total Help needed to walk in hospital room?: Total Help needed climbing 3-5 steps with a railing? : Total 6 Click Score: 8    End of Session Equipment Utilized During Treatment: Gait belt;Oxygen Activity Tolerance: Patient tolerated treatment well Patient left: in  bed;with call bell/phone within reach;with bed alarm set Nurse Communication: Mobility status;Precautions;Weight bearing status;Other (comment)(Pt informed about orthostatic response with position change) PT Visit Diagnosis: Other abnormalities of gait and mobility (R26.89);Muscle weakness (generalized) (M62.81);Pain Pain - Right/Left: Right Pain - part of body: Shoulder     Time: 8372-9021 PT Time Calculation (min) (ACUTE ONLY): 55 min   Charges:              Annabelle Harman, SPT 08/13/19 4:11 PM

## 2019-08-13 NOTE — Progress Notes (Signed)
Subjective:  POD #6 s/p right shoulder rotator cuff repair.   Patient readmitted to the hospital after he had a pre-sycopal episode at [facility].  Patient reports right shouler pain as moderate.  Patient experienced dizziness when trying to get up with PT/OT today.  Found to have orthostatic hypotension.  He is also being treated for a DVT with heparin.  Patient has a DVT in his right posterior tibial vein despite being on lovenox.  Patient is seen this afternoon with his RN.    Objective:   VITALS:   Vitals:   08/13/19 0604 08/13/19 0749 08/13/19 1136 08/13/19 1541  BP: 136/82 126/72 100/63   Pulse: 95 89 85 88  Resp: 18 18 18 17   Temp: 98.7 F (37.1 C) 98.4 F (36.9 C) 98.2 F (36.8 C) 98 F (36.7 C)  TempSrc: Oral Oral Oral Oral  SpO2: 91% 93% 96% 97%  Weight: (!) 163 kg     Height:        PHYSICAL EXAM: Right upper extremity: Patient's bandage has been removed.  His incisions are C/D/I.  He has no erythema, ecchymosis or swelling.  He has intact sensation to light touch.  He has full digital, wrist and elbow ROM.     LABS  Results for orders placed or performed during the hospital encounter of 08/11/19 (from the past 24 hour(s))  Glucose, capillary     Status: Abnormal   Collection Time: 08/12/19  8:16 PM  Result Value Ref Range   Glucose-Capillary 199 (H) 70 - 99 mg/dL   Comment 1 Notify RN   Heparin level (unfractionated)     Status: Abnormal   Collection Time: 08/12/19 10:57 PM  Result Value Ref Range   Heparin Unfractionated 1.05 (H) 0.30 - 0.70 IU/mL  Basic metabolic panel     Status: Abnormal   Collection Time: 08/13/19  5:30 AM  Result Value Ref Range   Sodium 136 135 - 145 mmol/L   Potassium 4.0 3.5 - 5.1 mmol/L   Chloride 97 (L) 98 - 111 mmol/L   CO2 29 22 - 32 mmol/L   Glucose, Bld 175 (H) 70 - 99 mg/dL   BUN 25 (H) 8 - 23 mg/dL   Creatinine, Ser 08/15/19 0.61 - 1.24 mg/dL   Calcium 8.3 (L) 8.9 - 10.3 mg/dL   GFR calc non Af Amer >60 >60 mL/min   GFR  calc Af Amer >60 >60 mL/min   Anion gap 10 5 - 15  CBC     Status: Abnormal   Collection Time: 08/13/19  5:30 AM  Result Value Ref Range   WBC 9.9 4.0 - 10.5 K/uL   RBC 4.79 4.22 - 5.81 MIL/uL   Hemoglobin 13.5 13.0 - 17.0 g/dL   HCT 08/15/19 12.2 - 48.2 %   MCV 87.9 80.0 - 100.0 fL   MCH 28.2 26.0 - 34.0 pg   MCHC 32.1 30.0 - 36.0 g/dL   RDW 50.0 (H) 37.0 - 48.8 %   Platelets 213 150 - 400 K/uL   nRBC 0.0 0.0 - 0.2 %  Magnesium     Status: None   Collection Time: 08/13/19  5:30 AM  Result Value Ref Range   Magnesium 2.1 1.7 - 2.4 mg/dL  Glucose, capillary     Status: Abnormal   Collection Time: 08/13/19  7:50 AM  Result Value Ref Range   Glucose-Capillary 190 (H) 70 - 99 mg/dL  Heparin level (unfractionated)     Status: Abnormal  Collection Time: 08/13/19  8:42 AM  Result Value Ref Range   Heparin Unfractionated 0.74 (H) 0.30 - 0.70 IU/mL  Glucose, capillary     Status: Abnormal   Collection Time: 08/13/19 11:37 AM  Result Value Ref Range   Glucose-Capillary 190 (H) 70 - 99 mg/dL  Heparin level (unfractionated)     Status: None   Collection Time: 08/13/19  3:47 PM  Result Value Ref Range   Heparin Unfractionated 0.66 0.30 - 0.70 IU/mL  CBC     Status: Abnormal   Collection Time: 08/13/19  3:47 PM  Result Value Ref Range   WBC 10.5 4.0 - 10.5 K/uL   RBC 5.06 4.22 - 5.81 MIL/uL   Hemoglobin 14.3 13.0 - 17.0 g/dL   HCT 16.1 09.6 - 04.5 %   MCV 88.9 80.0 - 100.0 fL   MCH 28.3 26.0 - 34.0 pg   MCHC 31.8 30.0 - 36.0 g/dL   RDW 40.9 (H) 81.1 - 91.4 %   Platelets 259 150 - 400 K/uL   nRBC 0.0 0.0 - 0.2 %  Glucose, capillary     Status: Abnormal   Collection Time: 08/13/19  4:37 PM  Result Value Ref Range   Glucose-Capillary 144 (H) 70 - 99 mg/dL    CT Angio Chest PE W/Cm &/Or Wo Cm  Result Date: 08/11/2019 CLINICAL DATA:  Near syncopal episode. EXAM: CT ANGIOGRAPHY CHEST WITH CONTRAST TECHNIQUE: Multidetector CT imaging of the chest was performed using the standard  protocol during bolus administration of intravenous contrast. Multiplanar CT image reconstructions and MIPs were obtained to evaluate the vascular anatomy. CONTRAST:  OMNIPAQUE IOHEXOL 350 MG/ML SOLN COMPARISON:  Chest radiograph dated 08/11/2019 and CT chest dated 10/22/2018. FINDINGS: Cardiovascular: Satisfactory opacification of the pulmonary arteries to the segmental level. No evidence of pulmonary embolism. Vascular calcifications are seen in the aortic arch. The heart is mildly enlarged. No pericardial effusion. Mediastinum/Nodes: No enlarged mediastinal, hilar, or axillary lymph nodes. Thyroid gland, trachea, and esophagus demonstrate no significant findings. Lungs/Pleura: There is moderate right lower and right middle lobe atelectasis. Mild ground-glass opacities are seen in the bilateral upper lobes, similar to prior exam. There is no pleural effusion or pneumothorax. Upper Abdomen: No acute abnormality. Musculoskeletal: No chest wall abnormality. No acute or significant osseous findings. Review of the MIP images confirms the above findings. IMPRESSION: 1. No evidence of pulmonary embolism. 2. Cardiomegaly and mild bilateral upper lobe ground-glass opacities likely reflect pulmonary edema. Moderate right lower and right middle lobe atelectasis. Aortic Atherosclerosis (ICD10-I70.0). Electronically Signed   By: Romona Curls M.D.   On: 08/11/2019 18:30   US Venous Img Lower Bilateral (DVT)  Result Date: 08/12/2019 CLINICAL DATA:  Bilateral lower extremity edema. EXAM: BILATERAL LOWER EXTREMITY VENOUS DOPPLER ULTRASOUND TECHNIQUE: Gray-scale sonography with graded compression, as well as color Doppler and duplex ultrasound were performed to evaluate the lower extremity deep venous systems from the level of the common femoral vein and including the common femoral, femoral, profunda femoral, popliteal and calf veins including the posterior tibial, peroneal and gastrocnemius veins when visible. The  superficial great saphenous vein was also interrogated. Spectral Doppler was utilized to evaluate flow at rest and with distal augmentation maneuvers in the common femoral, femoral and popliteal veins. COMPARISON:  None. FINDINGS: RIGHT LOWER EXTREMITY Common Femoral Vein: No evidence of thrombus. Normal compressibility, respiratory phasicity and response to augmentation. Saphenofemoral Junction: No evidence of thrombus. Normal compressibility and flow on color Doppler imaging. Profunda Femoral Vein: No evidence  of thrombus. Normal compressibility and flow on color Doppler imaging. Femoral Vein: No evidence of thrombus. Normal compressibility, respiratory phasicity and response to augmentation. Popliteal Vein: No evidence of thrombus. Normal compressibility, respiratory phasicity and response to augmentation. Calf Veins: There appears to be likely occlusive thrombus in the right posterior tibial vein. The right peroneal vein is normally patent. Superficial Great Saphenous Vein: No evidence of thrombus. Normal compressibility. Venous Reflux:  None. Other Findings: No evidence of superficial thrombophlebitis or abnormal fluid collection. LEFT LOWER EXTREMITY Common Femoral Vein: No evidence of thrombus. Normal compressibility, respiratory phasicity and response to augmentation. Saphenofemoral Junction: No evidence of thrombus. Normal compressibility and flow on color Doppler imaging. Profunda Femoral Vein: No evidence of thrombus. Normal compressibility and flow on color Doppler imaging. Femoral Vein: No evidence of thrombus. Normal compressibility, respiratory phasicity and response to augmentation. Popliteal Vein: No evidence of thrombus. Normal compressibility, respiratory phasicity and response to augmentation. Calf Veins: No evidence of thrombus. Normal compressibility and flow on color Doppler imaging. Superficial Great Saphenous Vein: No evidence of thrombus. Normal compressibility. Venous Reflux:  None. Other  Findings: No evidence of superficial thrombophlebitis or abnormal fluid collection. IMPRESSION: Deep vein thrombus isolated to the right posterior tibial vein in the calf. Other deep veins in both lower extremities are normally patent. Electronically Signed   By: Irish Lack M.D.   On: 08/12/2019 12:03   ECHOCARDIOGRAM COMPLETE  Result Date: 08/12/2019    ECHOCARDIOGRAM REPORT   Patient Name:   JMARI PELC Date of Exam: 08/12/2019 Medical Rec #:  [id number]       Height:       81.0 in Accession #:    [id number]      Weight:       360.3 lb Date of Birth:  11/02/1955        BSA:          2.979 m Patient Age:    64 years        BP:           119/76 mmHg Patient Gender: M               HR:           91 bpm. Exam Location:  [facility] Procedure: 2D Echo and Intracardiac Opacification Agent Indications:     Diastolic CHF  History:         Patient has prior history of Echocardiogram examinations. Risk                  Factors:Hypertension, Diabetes and Dyslipidemia.  Sonographer:     LTM Referring Phys:  [id number] Vernetta Honey MANSY Diagnosing Phys: Julien Nordmann MD IMPRESSIONS  1. Left ventricular ejection fraction, by estimation, is 60 to 65%. The left ventricle has normal function. The left ventricle has no regional wall motion abnormalities. There is mild left ventricular hypertrophy. Left ventricular diastolic parameters are consistent with Grade I diastolic dysfunction (impaired relaxation).  2. Right ventricular systolic function is normal. The right ventricular size is normal. There is mildly elevated pulmonary artery systolic pressure. FINDINGS  Left Ventricle: Left ventricular ejection fraction, by estimation, is 60 to 65%. The left ventricle has normal function. The left ventricle has no regional wall motion abnormalities. The left ventricular internal cavity size was normal in size. There is  mild left ventricular hypertrophy. Left ventricular diastolic parameters are consistent with Grade I diastolic dysfunction  (impaired relaxation). Right Ventricle: The right ventricular size is normal. No increase in  right ventricular wall thickness. Right ventricular systolic function is normal. There is mildly elevated pulmonary artery systolic pressure. The tricuspid regurgitant velocity is 2.58  m/s, and with an assumed right atrial pressure of 5 mmHg, the estimated right ventricular systolic pressure is 31.6 mmHg. Left Atrium: Left atrial size was normal in size. Right Atrium: Right atrial size was normal in size. Pericardium: There is no evidence of pericardial effusion. Mitral Valve: The mitral valve was not well visualized. Normal mobility of the mitral valve leaflets. No evidence of mitral valve regurgitation. No evidence of mitral valve stenosis. Tricuspid Valve: The tricuspid valve is not well visualized. Tricuspid valve regurgitation is trivial. No evidence of tricuspid stenosis. Aortic Valve: The aortic valve was not well visualized. Aortic valve regurgitation is not visualized. Mild aortic valve sclerosis is present, with no evidence of aortic valve stenosis. Aortic valve mean gradient measures 10.0 mmHg. Aortic valve peak gradient measures 15.4 mmHg. Aortic valve area, by VTI measures 1.73 cm. Pulmonic Valve: The pulmonic valve was not well visualized. Pulmonic valve regurgitation is not visualized. No evidence of pulmonic stenosis. Aorta: The aortic root is normal in size and structure and the aortic root was not well visualized. Venous: The pulmonary veins were not well visualized. The inferior vena cava is normal in size with greater than 50% respiratory variability, suggesting right atrial pressure of 3 mmHg. IAS/Shunts: No atrial level shunt detected by color flow Doppler.  LEFT VENTRICLE PLAX 2D LVIDd:         4.07 cm  Diastology LVIDs:         3.36 cm  LV e' lateral:   5.87 cm/s LV PW:         1.46 cm  LV E/e' lateral: 11.1 LV IVS:        1.38 cm  LV e' medial:    5.66 cm/s LVOT diam:     1.90 cm  LV E/e' medial:   11.5 LV SV:         54 LV SV Index:   18 LVOT Area:     2.84 cm  RIGHT VENTRICLE RV S prime:     19.70 cm/s TAPSE (M-mode): 2.3 cm LEFT ATRIUM         Index LA diam:    3.90 cm 1.31 cm/m  AORTIC VALVE                    PULMONIC VALVE AV Area (Vmax):    1.27 cm     PV Vmax:       1.51 m/s AV Area (Vmean):   1.22 cm     PV Vmean:      90.000 cm/s AV Area (VTI):     1.73 cm     PV VTI:        0.234 m AV Vmax:           196.00 cm/s  PV Peak grad:  9.1 mmHg AV Vmean:          145.000 cm/s PV Mean grad:  4.0 mmHg AV VTI:            0.316 m AV Peak Grad:      15.4 mmHg AV Mean Grad:      10.0 mmHg LVOT Vmax:         87.90 cm/s LVOT Vmean:        62.200 cm/s LVOT VTI:          0.192 m LVOT/AV VTI ratio: 0.61  AORTA Ao Root diam: 3.40 cm MITRAL VALVE                TRICUSPID VALVE MV Area (PHT): 3.06 cm     TR Peak grad:   26.6 mmHg MV E velocity: 65.00 cm/s   TR Vmax:        258.00 cm/s MV A velocity: 104.00 cm/s MV E/A ratio:  0.62         SHUNTS                             Systemic VTI:  0.19 m                             Systemic Diam: 1.90 cm Ida Rogue MD Electronically signed by Ida Rogue MD Signature Date/Time: 08/12/2019/1:27:53 PM    Final     Assessment/Plan:     Active Problems:   Acute cor pulmonale (HCC)   Acute metabolic encephalopathy   Acute pulmonary edema ([place])   Edema  Patient will continue to use the polar care for assistance with pain control.  He will leave the sling off while resting in bed.   Patient will wear the sling when up with PT.  The main issue is orthostatic hypotension currently.  Patient receiving lasix for fluid overload.  Patient has oxycodone, morphine, tramadol, neurontin and tylenol available for pain.  Continue strict blood sugar control.   Continue heparin for DVT treatment.  Patient should continue PT/OT as his symptoms allow.  At the patient's request, I have called his wife to update her on the above noted plan.  I answered all her  questions.    Thornton Park , MD 08/13/2019, 5:27 PM

## 2019-08-13 NOTE — Evaluation (Signed)
Occupational Therapy Evaluation Patient Details Name: Nathan Dean MRN: [id number] DOB: Jun 27, 1955 Today's Date: 08/13/2019    History of Present Illness Per MD notes: Pt is 64 yo male who presented to the emergency room with acute onset of presyncope while having a bowel movement.  MD assessment includes acute on chronic hypoxic respiratory failure, acute pulmonary edema/acute on chronic HFpEF, hypertensive urgency, near syncope, and RLE DVT.  Of note pt with recent R shoulder arthroscopy 08/07/19 with subacromial decompression and distal clavicle excision, biceps tenotomy, and rotator cuff repair.  PMH also includes OSA, HTN, GERD, DM, and TKA.   Clinical Impression   Patient was seen for an OT evaluation and co-tx with PT this date. Pt from [facility] for STR after acute onset of presyncope while toileting. Prior to shoulder surgery and previous recent admission, pt was active and independent. Since surgery, pt requires +2 or more assist for all aspects of bed mobility, ADL transfers, polar care mgt, and sling mgt. Max A for UB/LB dressing and bathing. Pt reporting significant R shoulder pain, increasing with any bed mobility efforts. Pt noted with no shoulder sling or polar care in place upon arrival. Pt reports not having been on in a few days. During bed mobility effort, pt noted to have had small BM in bed, requiring log rolling instruction and assist to perform pericare and linens change. RUE supported by this therapist to support proper positioning. Pt also endorsed becoming lightheaded, increasing with time EOB. Pt tolerated donning of polar care on R shoulder with PT assisting with sitting balance. Pt unable to tolerate sustained sitting EOB 2/2 lightheadedness, requiring pt to lay back down prior to donning of shoulder sling/immobilizer. BP checked, significant drop noted from supine to sitting. RN notified by PT. Will continue to check orthostatics in subsequent sessions. Pt instructed in  polar care mgt, sling/immobilizer mgt, and ROM/precautions for RUE. Pt verbalized understanding, requiring VC during ADL mobility tasks to incorporate and carry over previous instruction. Recommend pt return to SNF to continue rehab therapy in order to maximize return to PLOF, address home/routines modifications and safety, minimize falls risk, and minimize caregiver burden.      Follow Up Recommendations  SNF;Supervision - Intermittent(supv for all OOB activity)    Equipment Recommendations  Toilet riser    Recommendations for Other Services       Precautions / Restrictions Precautions Precautions: Fall;Shoulder Shoulder Interventions: Shoulder sling/immobilizer;Shoulder abduction pillow;At all times Precaution Booklet Issued: No Required Braces or Orthoses: Sling Restrictions Weight Bearing Restrictions: Yes RUE Weight Bearing: Non weight bearing Other Position/Activity Restrictions: Per MD, prefers pt to be NWB to RUE, but would permit a "light lean" of R elbow on platform walker      Mobility Bed Mobility Overal bed mobility: Needs Assistance Bed Mobility: Sit to Supine;Rolling Rolling: +2 for physical assistance;+2 for safety/equipment;Mod assist   Supine to sit: HOB elevated;Min assist;+2 for physical assistance;+2 for safety/equipment;Mod assist Sit to supine: Min assist;HOB elevated;+2 for physical assistance;+2 for safety/equipment;Mod assist   General bed mobility comments: For supine to sit, pt required min-mod assist for his trunk. For sit to supine, pt required min-mod assist for LE's. Pt required cueing and bed adjustments (reverse trendelenberg) for bed positioning.  Transfers                 General transfer comment: Not attempted secondary to orthostatic response sitting EOB    Balance Overall balance assessment: Needs assistance Sitting-balance support: Feet supported Sitting balance-Leahy Scale: Poor Sitting  balance - Comments: Pt had difficulty  maintaining ind sitting balance with BUE support and required assistance that ranged from CGA to mod A to maintain upright sitting posture Postural control: Posterior lean     Standing balance comment: Unable/unsafe to attempt at this time                           ADL either performed or assessed with clinical judgement   ADL Overall ADL's : Needs assistance/impaired Eating/Feeding: Set up   Grooming: Minimal assistance;Sitting;Cueing for compensatory techniques;Cueing for sequencing;Cueing for UE precautions   Upper Body Bathing: Bed level;Maximal assistance   Lower Body Bathing: Bed level;Maximal assistance   Upper Body Dressing : Sitting;Maximal assistance   Lower Body Dressing: Bed level;Maximal assistance                 General ADL Comments: Mod verbal cues for sequencing/maintaining precautions     Vision Baseline Vision/History: Wears glasses Wears Glasses: At all times Patient Visual Report: No change from baseline       Perception     Praxis      Pertinent Vitals/Pain Pain Assessment: 0-10 Pain Score: 6  Pain Location: R shoulder at rest, 10/10 with any bed mobility Pain Descriptors / Indicators: Aching;Sore;Grimacing;Moaning Pain Intervention(s): Limited activity within patient's tolerance;Monitored during session;Premedicated before session;Repositioned;Ice applied     Hand Dominance Right   Extremity/Trunk Assessment Upper Extremity Assessment Upper Extremity Assessment: RUE deficits/detail RUE Deficits / Details: s/p surgery, in sling at all times RUE: Unable to fully assess due to pain;Unable to fully assess due to immobilization RUE Sensation: WNL   Lower Extremity Assessment Lower Extremity Assessment: Generalized weakness   Cervical / Trunk Assessment Cervical / Trunk Assessment: Normal   Communication Communication Communication: No difficulties   Cognition Arousal/Alertness: Awake/alert Behavior During Therapy: WFL  for tasks assessed/performed Overall Cognitive Status: Within Functional Limits for tasks assessed                                     General Comments  Drop in BP with sitting EOB, pt symptomatic, RN notified; pt required VC during session to maintain RUE precautions when out of sling    Exercises Other Exercises Other Exercises: Rolling, bed positioning, and lateral scootching EOB training and practice Other Exercises: Pt educated in UB dressing strategies, RUE precautions, positioning while out of sling in bed including use of pillows, and polar care mgt; pt unable to tolerate sitting EOB long enough to don shoulder sling/immobilizer. Will re-attempt next session   Shoulder Instructions      Home Living Family/patient expects to be discharged to:: Private residence Living Arrangements: Spouse/significant other Available Help at Discharge: Family;Available PRN/intermittently(Wife works during the day) Type of Home: House Home Access: Stairs to enter [employer] of Steps: 3-4 Entrance Stairs-Rails: Right;Left;Can reach both Home Layout: One level     Bathroom Shower/Tub: Runner, broadcasting/film/video: Toilet riser;Walker - 2 wheels;Other (comment);Cane - quad   Additional Comments: owns tripod cane      Prior Functioning/Environment Level of Independence: Independent with assistive device(s)        Comments: Patient reports he was MOD I for BADLs and did not use AD for functional mobility within home.  Increasing amount of falls in the past 2 weeks.  uses tripod cane for community mobility.  OT Problem List: Decreased strength;Decreased range of motion;Decreased activity tolerance;Impaired balance (sitting and/or standing);Decreased safety awareness;Impaired UE functional use;Decreased knowledge of precautions;Decreased knowledge of use of DME or AE;Pain;Cardiopulmonary status limiting activity;Obesity      OT  Treatment/Interventions: Self-care/ADL training;Therapeutic exercise;Energy conservation;DME and/or AE instruction;Therapeutic activities;Patient/family education;Balance training    OT Goals(Current goals can be found in the care plan section) Acute Rehab OT Goals Patient Stated Goal: Get stronger Time For Goal Achievement: 08/27/19 Potential to Achieve Goals: Good ADL Goals Pt Will Perform Upper Body Dressing: with mod assist;sitting Pt Will Perform Lower Body Dressing: with mod assist;sit to/from stand;with adaptive equipment Pt Will Transfer to Toilet: with mod assist;with +2 assist;bedside commode Additional ADL Goal #1: Pt will independently instruct family/caregiver in polar care mgt and shoulder sling/immobilizer mgt  OT Frequency: Min 2X/week   Barriers to D/C: Inaccessible home environment;Decreased caregiver support          Co-evaluation PT/OT/SLP Co-Evaluation/Treatment: Yes Reason for Co-Treatment: For patient/therapist safety;To address functional/ADL transfers PT goals addressed during session: Mobility/safety with mobility;Balance OT goals addressed during session: ADL's and self-care;Proper use of Adaptive equipment and DME      AM-PAC OT "6 Clicks" Daily Activity     Outcome Measure Help from another person eating meals?: A Little Help from another person taking care of personal grooming?: A Little Help from another person toileting, which includes using toliet, bedpan, or urinal?: Total Help from another person bathing (including washing, rinsing, drying)?: A Lot Help from another person to put on and taking off regular upper body clothing?: A Lot Help from another person to put on and taking off regular lower body clothing?: A Lot 6 Click Score: 13   End of Session Nurse Communication: Other (comment)(BP)  Activity Tolerance: Patient tolerated treatment well Patient left: in bed;with call bell/phone within reach;with bed alarm set  OT Visit Diagnosis:  Unsteadiness on feet (R26.81);Other abnormalities of gait and mobility (R26.89);Muscle weakness (generalized) (M62.81);History of falling (Z91.81);Pain Pain - Right/Left: Right Pain - part of body: Shoulder                Time: 0086-7619 OT Time Calculation (min): 53 min Charges:  OT General Charges $OT Visit: 1 Visit OT Evaluation $OT Eval Moderate Complexity: 1 Mod OT Treatments $Self Care/Home Management : 8-22 mins  Richrd Prime, MPH, MS, OTR/L ascom [phone] 08/13/19, 4:25 PM

## 2019-08-14 LAB — BASIC METABOLIC PANEL
Anion gap: 10 (ref 5–15)
BUN: 30 mg/dL — ABNORMAL HIGH (ref 8–23)
CO2: 32 mmol/L (ref 22–32)
Calcium: 8.4 mg/dL — ABNORMAL LOW (ref 8.9–10.3)
Chloride: 94 mmol/L — ABNORMAL LOW (ref 98–111)
Creatinine, Ser: 1.01 mg/dL (ref 0.61–1.24)
GFR calc Af Amer: >60 mL/min (ref >60)
GFR calc non Af Amer: >60 mL/min (ref >60)
Glucose, Bld: 165 mg/dL — ABNORMAL HIGH (ref 70–99)
Potassium: 3.6 mmol/L (ref 3.5–5.1)
Sodium: 136 mmol/L (ref 135–145)

## 2019-08-14 LAB — LEGIONELLA PNEUMOPHILA SEROGP 1 UR AG: L. pneumophila Serogp 1 Ur Ag: NEGATIVE

## 2019-08-14 LAB — HEPARIN LEVEL (UNFRACTIONATED)
Heparin Unfractionated: 0.46 IU/mL (ref 0.30–0.70)
Heparin Unfractionated: 0.48 IU/mL (ref 0.30–0.70)

## 2019-08-14 LAB — CBC
HCT: 41.6 % (ref 39.0–52.0)
Hemoglobin: 13.4 g/dL (ref 13.0–17.0)
MCH: 28.5 pg (ref 26.0–34.0)
MCHC: 32.2 g/dL (ref 30.0–36.0)
MCV: 88.5 fL (ref 80.0–100.0)
Platelets: 232 10*3/uL (ref 150–400)
RBC: 4.7 MIL/uL (ref 4.22–5.81)
RDW: 15.7 % — ABNORMAL HIGH (ref 11.5–15.5)
WBC: 8.8 10*3/uL (ref 4.0–10.5)
nRBC: 0 % (ref 0.0–0.2)

## 2019-08-14 LAB — GLUCOSE, CAPILLARY
Glucose-Capillary: 229 mg/dL — ABNORMAL HIGH (ref 70–99)
Glucose-Capillary: 147 mg/dL — ABNORMAL HIGH (ref 70–99)
Glucose-Capillary: 218 mg/dL — ABNORMAL HIGH (ref 70–99)
Glucose-Capillary: 181 mg/dL — ABNORMAL HIGH (ref 70–99)

## 2019-08-14 LAB — MAGNESIUM: Magnesium: 2.3 mg/dL (ref 1.7–2.4)

## 2019-08-14 MED ORDER — ENSURE ENLIVE PO LIQD
237.0000 mL | Freq: Two times a day (BID) | ORAL | Status: DC
  Administered 2019-08-14 – 2019-08-17 (×6): 237 mL via ORAL

## 2019-08-14 MED ORDER — APIXABAN 5 MG PO TABS: 5.0000 mg | ORAL_TABLET | Freq: Two times a day (BID) | ORAL | Status: DC

## 2019-08-14 MED ORDER — FUROSEMIDE 10 MG/ML IJ SOLN
40.0000 mg | Freq: Every day | INTRAMUSCULAR | Status: DC
  Administered 2019-08-14 – 2019-08-16 (×3): 40 mg via INTRAVENOUS
  Filled 2019-08-14 (×3): qty 4

## 2019-08-14 MED ORDER — POTASSIUM CHLORIDE CRYS ER 20 MEQ PO TBCR
40.0000 meq | EXTENDED_RELEASE_TABLET | Freq: Once | ORAL | Status: AC
  Administered 2019-08-14: 40 meq via ORAL
  Filled 2019-08-14: qty 2

## 2019-08-14 MED ORDER — APIXABAN 5 MG PO TABS
10.0000 mg | ORAL_TABLET | Freq: Two times a day (BID) | ORAL | Status: DC
  Administered 2019-08-14 – 2019-08-17 (×6): 10 mg via ORAL
  Filled 2019-08-14 (×6): qty 2

## 2019-08-14 NOTE — Progress Notes (Signed)
Subjective:  POD #7 s/p right shoulder rotator cuff repair.   Patient reports right shoulder pain as mild.  Patient's right shoulder pain is improved since yesterday.  Patient still having dizziness with physical therapy today.  Objective:   VITALS:   Vitals:   08/14/19 0544 08/14/19 0813 08/14/19 1124 08/14/19 1633  BP: 109/61 102/63 129/74 128/88  Pulse: 90 95 (!) 105 91  Resp: 18 18 20 18   Temp: 98.4 F (36.9 C) 98.2 F (36.8 C) 98.8 F (37.1 C) 98.7 F (37.1 C)  TempSrc: Oral     SpO2: 95% (!) 85% 92% 92%  Weight: (!) 160.2 kg     Height:        PHYSICAL EXAM: Right upper extremity Neurovascular intact Sensation intact distally Intact pulses distally Incision: no drainage No cellulitis present Compartment soft  LABS  Results for orders placed or performed during the hospital encounter of 08/11/19 (from the past 24 hour(s))  Glucose, capillary     Status: Abnormal   Collection Time: 08/13/19  9:07 PM  Result Value Ref Range   Glucose-Capillary 180 (H) 70 - 99 mg/dL  Heparin level (unfractionated)     Status: None   Collection Time: 08/14/19 12:06 AM  Result Value Ref Range   Heparin Unfractionated 0.48 0.30 - 0.70 IU/mL  Basic metabolic panel     Status: Abnormal   Collection Time: 08/14/19 12:06 AM  Result Value Ref Range   Sodium 136 135 - 145 mmol/L   Potassium 3.6 3.5 - 5.1 mmol/L   Chloride 94 (L) 98 - 111 mmol/L   CO2 32 22 - 32 mmol/L   Glucose, Bld 165 (H) 70 - 99 mg/dL   BUN 30 (H) 8 - 23 mg/dL   Creatinine, Ser 1.01 0.61 - 1.24 mg/dL   Calcium 8.4 (L) 8.9 - 10.3 mg/dL   GFR calc non Af Amer >60 >60 mL/min   GFR calc Af Amer >60 >60 mL/min   Anion gap 10 5 - 15  CBC     Status: Abnormal   Collection Time: 08/14/19 12:06 AM  Result Value Ref Range   WBC 8.8 4.0 - 10.5 K/uL   RBC 4.70 4.22 - 5.81 MIL/uL   Hemoglobin 13.4 13.0 - 17.0 g/dL   HCT 41.6 39.0 - 52.0 %   MCV 88.5 80.0 - 100.0 fL   MCH 28.5 26.0 - 34.0 pg   MCHC 32.2 30.0 - 36.0  g/dL   RDW 15.7 (H) 11.5 - 15.5 %   Platelets 232 150 - 400 K/uL   nRBC 0.0 0.0 - 0.2 %  Magnesium     Status: None   Collection Time: 08/14/19 12:06 AM  Result Value Ref Range   Magnesium 2.3 1.7 - 2.4 mg/dL  Heparin level (unfractionated)     Status: None   Collection Time: 08/14/19  7:53 AM  Result Value Ref Range   Heparin Unfractionated 0.46 0.30 - 0.70 IU/mL  Glucose, capillary     Status: Abnormal   Collection Time: 08/14/19  8:15 AM  Result Value Ref Range   Glucose-Capillary 229 (H) 70 - 99 mg/dL  Glucose, capillary     Status: Abnormal   Collection Time: 08/14/19 11:44 AM  Result Value Ref Range   Glucose-Capillary 147 (H) 70 - 99 mg/dL  Glucose, capillary     Status: Abnormal   Collection Time: 08/14/19  4:38 PM  Result Value Ref Range   Glucose-Capillary 218 (H) 70 - 99 mg/dL  No results found.  Assessment/Plan:     Active Problems:   Acute cor pulmonale (HCC)   Acute metabolic encephalopathy   Acute pulmonary edema (HCC)   Edema  Continue physical and Occupational Therapy.  Patient will wear his sling when out of bed with therapy.  Patient may remove sling while in bed.  Patient should avoid active forward elevation and abduction.  Patient will continue medical treatment for orthostatic hypotension.  Discharge back to [facility] when medically cleared.  Patient will follow-up in my office in approximately 7 to 10 days after discharge from the hospital.  Patient will need medical treatment for DVT upon discharge.    Juanell Fairly , MD 08/14/2019, 7:04 PM

## 2019-08-14 NOTE — Progress Notes (Signed)
Physical Therapy Treatment Patient Details Name: Rondel Episcopo MRN: [id number] DOB: 1955/06/01 Today's Date: 08/14/2019    History of Present Illness Per MD notes: Pt is 64 yo male who presented to the emergency room with acute onset of presyncope while having a bowel movement.  MD assessment includes acute on chronic hypoxic respiratory failure, acute pulmonary edema/acute on chronic HFpEF, hypertensive urgency, near syncope, and RLE DVT.  Of note pt with recent R shoulder arthroscopy 08/07/19 with subacromial decompression and distal clavicle excision, biceps tenotomy, and rotator cuff repair.  PMH also includes OSA, HTN, GERD, DM, and TKA.    PT Comments    Pt pleasant and motivated to participate during the session. Pt found with supplemental O2 off with a SpO2 in the mid-high 80's - O2 reapplied. SpO2 returned to the low 90's within a minute. Orthostatics were checked again: in supine, BP was initially 95/59, after in-bed exercises, BP was 106/75, in sitting BP was 129/74. Pt was symptomatic in supine and had complaints of lightheadedness. When sitting EOB, pt's main complaints were R shoulder and back pain and after sitting up for around 5 minutes, pt requested to return to supine despite encouragement and education on physiological benefits of sitting upright. Pt will benefit from PT services in a SNF setting upon discharge to safely address deficits listed in patient problem list for decreased caregiver assistance and eventual return to PLOF.    Follow Up Recommendations  SNF     Equipment Recommendations  3in1 (PT)    Recommendations for Other Services       Precautions / Restrictions Precautions Precautions: Fall;Shoulder Shoulder Interventions: Shoulder sling/immobilizer;Shoulder abduction pillow Precaution Booklet Issued: No Precaution Comments: Per Dr. Martha Clan: "He will leave the sling off while resting in bed.   Patient will wear the sling when up with PT." Required  Braces or Orthoses: Sling Restrictions Weight Bearing Restrictions: Yes RUE Weight Bearing: Non weight bearing Other Position/Activity Restrictions: From recent admission: Per MD, prefers pt to be NWB to RUE, but would permit a "light lean" of R elbow on platform walker    Mobility  Bed Mobility Overal bed mobility: Needs Assistance Bed Mobility: Sit to Supine;Rolling Rolling: +2 for physical assistance;+2 for safety/equipment;Mod assist   Supine to sit: HOB elevated;+2 for physical assistance;+2 for safety/equipment;Mod assist Sit to supine: HOB elevated;+2 for physical assistance;+2 for safety/equipment;Mod assist;Min assist   General bed mobility comments: For supine to sit, pt required mod assist for his trunk. For sit to supine, pt required min-mod assist for LE's and trunk. Pt required cueing and bed adjustments (reverse trendelenberg) for bed positioning.  Transfers                 General transfer comment: Unable to attempt secondary to pt requesting to return to supine. Pt was unable to scoot EOB for positioning even with going downhill in the bed with mod A.  Ambulation/Gait                 Stairs             Wheelchair Mobility    Modified Rankin (Stroke Patients Only)       Balance Overall balance assessment: Needs assistance   Sitting balance-Leahy Scale: Fair Sitting balance - Comments: Pt required occasional min assist to maintain upright sitting posture Postural control: Posterior lean     Standing balance comment: Unable/unsafe to attempt at this time  Cognition Arousal/Alertness: Awake/alert Behavior During Therapy: WFL for tasks assessed/performed Overall Cognitive Status: Within Functional Limits for tasks assessed                                        Exercises Total Joint Exercises Ankle Circles/Pumps: AROM;Strengthening;Both;10 reps Quad Sets: Strengthening;Both;10  reps Gluteal Sets: Strengthening;Both;10 reps Heel Slides: AROM;Strengthening;Both;10 reps Hip ABduction/ADduction: AAROM;Both;10 reps Straight Leg Raises: AAROM;Both;10 reps Long Arc Quad: AROM;Strengthening;Both;10 reps General Exercises - Upper Extremity Shoulder Flexion: AROM;Strengthening;Left;10 reps;Supine Elbow Flexion: AROM;Strengthening;Both;10 reps;Supine Other Exercises Other Exercises: EOB sitting tolerance    General Comments        Pertinent Vitals/Pain Pain Assessment: 0-10 Pain Score: 2  Pain Location: 2/10 R shoulder pain at rest, when sitting EOB, severe R shoulder and back pain Pain Descriptors / Indicators: Aching;Sore;Grimacing;Moaning Pain Intervention(s): Limited activity within patient's tolerance;Monitored during session;Ice applied    Home Living                      Prior Function            PT Goals (current goals can now be found in the care plan section) Progress towards PT goals: Progressing toward goals    Frequency    7X/week      PT Plan Current plan remains appropriate    Co-evaluation              AM-PAC PT "6 Clicks" Mobility   Outcome Measure  Help needed turning from your back to your side while in a flat bed without using bedrails?: A Lot Help needed moving from lying on your back to sitting on the side of a flat bed without using bedrails?: A Lot Help needed moving to and from a bed to a chair (including a wheelchair)?: Total Help needed standing up from a chair using your arms (e.g., wheelchair or bedside chair)?: Total Help needed to walk in hospital room?: Total Help needed climbing 3-5 steps with a railing? : Total 6 Click Score: 8    End of Session Equipment Utilized During Treatment: Gait belt;Oxygen Activity Tolerance: Patient tolerated treatment well Patient left: in bed;with call bell/phone within reach;with bed alarm set;with family/visitor present Nurse Communication: Mobility  status;Precautions;Weight bearing status PT Visit Diagnosis: Other abnormalities of gait and mobility (R26.89);Muscle weakness (generalized) (M62.81);Pain Pain - Right/Left: Right Pain - part of body: Shoulder     Time: 4235-3614 PT Time Calculation (min) (ACUTE ONLY): 46 min  Charges:                        Annabelle Harman, SPT 08/14/19 12:11 PM

## 2019-08-14 NOTE — [place] FL2 (Signed)
[place] MEDICAID FL2 LEVEL OF CARE SCREENING TOOL     IDENTIFICATION  Patient Name: Nathan Dean Birthdate: May 09, 1956 Sex: male Admission Date (Current Location): 08/11/2019  [place] and [place] Number:  Chiropodist and Address:  [facility], [address], [place], [place] [address]      Provider Number: [id number]  Attending Physician Name and Address:  Rolly Salter, MD  Relative Name and Phone Number:       Current Level of Care: Hospital Recommended Level of Care: Skilled Nursing Facility Prior Approval Number:    Date Approved/Denied:   PASRR Number: [id number]  Discharge Plan: SNF    Current Diagnoses: Patient Active Problem List   Diagnosis Date Noted  . Edema   . Acute metabolic encephalopathy 08/12/2019  . Acute pulmonary edema (HCC) 08/12/2019  . Acute cor pulmonale (HCC) 08/11/2019  . Oxygen desaturation 08/08/2019  . S/P right rotator cuff repair 08/07/2019  . Hypoxia 10/22/2018  . Infection of right prepatellar bursa 10/18/2018  . Sepsis (HCC) 10/18/2018  . Chest congestion 07/20/2017  . Viral illness 07/20/2017  . Lower respiratory infection 07/12/2017  . Laryngitis, acute 07/12/2017  . Cough 07/12/2017  . Pleurisy 07/12/2017  . Depression 06/03/2015  . HTN (hypertension) 01/02/2015  . Hyperlipidemia 01/02/2015  . 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 Sleep apnea 09/21/2010  . Arthritis, degenerative 02/17/2009  . Hereditary and idiopathic peripheral neuropathy 11/21/2008  . AD (atopic dermatitis) 07/08/2008  . Acid reflux 04/05/2007    Orientation RESPIRATION BLADDER Height & Weight     Self, Time, Situation, Place  Normal Incontinent Weight: (!) 353 lb 3.2 oz (160.2 kg) Height:  6\' 9"  (205.7 cm)  BEHAVIORAL SYMPTOMS/MOOD NEUROLOGICAL BOWEL NUTRITION STATUS      Incontinent Diet(heart healthy/ carb modified, thin liquids)  AMBULATORY STATUS  COMMUNICATION OF NEEDS Skin   Extensive Assist Verbally Surgical wounds(located on right shoulder, steri strip)                       Personal Care Assistance Level of Assistance  Dressing, Feeding, Bathing Bathing Assistance: Maximum assistance Feeding assistance: Limited assistance Dressing Assistance: Maximum assistance     Functional Limitations Info  Sight, Hearing, Speech Sight Info: Adequate Hearing Info: Adequate Speech Info: Adequate    SPECIAL CARE FACTORS FREQUENCY  PT (By licensed PT), OT (By licensed OT)     PT Frequency: 5x OT Frequency: 5x            Contractures Contractures Info: Not present    Additional Factors Info  Code Status, Allergies Code Status Info: Full Code Allergies Info: Penicillins, Succinylcholine Chloride, Keflex (Cephalexin), Sulfa Antibiotics           Current Medications (08/14/2019):  This is the current hospital active medication list Current Facility-Administered Medications  Medication Dose Route Frequency Provider Last Rate Last Admin  . acetaminophen (TYLENOL) tablet 650 mg  650 mg Oral Q6H PRN Mansy, Jan A, MD       Or  . acetaminophen (TYLENOL) suppository 650 mg  650 mg Rectal Q6H PRN Mansy, Jan A, MD      . aspirin EC tablet 81 mg  81 mg Oral Daily Mansy, Jan A, MD   81 mg at 08/14/19 08/16/19  . bisacodyl (DULCOLAX) EC tablet 5 mg  5 mg Oral Daily PRN Mansy, Jan A, MD   5 mg at 08/12/19 0003  . chlorpheniramine-HYDROcodone (  TUSSIONEX) 10-8 MG/5ML suspension 5 mL  5 mL Oral Q12H PRN Mansy, Jan A, MD      . DULoxetine (CYMBALTA) DR capsule 30 mg  30 mg Oral Daily Mansy, Jan A, MD   30 mg at 08/14/19 0839  . furosemide (LASIX) injection 40 mg  40 mg Intravenous Daily Lavina Hamman, MD   40 mg at 08/14/19 0839  . gabapentin (NEURONTIN) capsule 300 mg  300 mg Oral TID Lavina Hamman, MD   300 mg at 08/14/19 [phone]  . guaiFENesin (MUCINEX) 12 hr tablet 600 mg  600 mg Oral BID Mansy, Jan A, MD   600 mg at 08/14/19 0839  .  heparin ADULT infusion 100 units/mL (25000 units/253mL sodium chloride 0.45%)  1,800 Units/hr Intravenous Continuous Benita Gutter, RPH 18 mL/hr at 08/14/19 0837 1,800 Units/hr at 08/14/19 0837  . insulin aspart (novoLOG) injection 0-15 Units  0-15 Units Subcutaneous TID PC & HS Mansy, Arvella Merles, MD   5 Units at 08/14/19 [phone]  . insulin glargine (LANTUS) injection 80 Units  80 Units Subcutaneous Daily Mansy, Arvella Merles, MD   80 Units at 08/14/19 [phone]  . labetalol (NORMODYNE) injection 20 mg  20 mg Intravenous Q3H PRN Mansy, Jan A, MD      . magnesium hydroxide (MILK OF MAGNESIA) suspension 30 mL  30 mL Oral Daily PRN Mansy, Jan A, MD   30 mL at 08/12/19 0005  . melatonin tablet 10 mg  10 mg Oral QHS PRN Mansy, Jan A, MD   10 mg at 08/14/19 0107  . morphine 2 MG/ML injection 2 mg  2 mg Intravenous Q4H PRN Lavina Hamman, MD   2 mg at 08/13/19 0902  . multivitamin with minerals tablet 1 tablet  1 tablet Oral Daily Mansy, Jan A, MD   1 tablet at 08/14/19 [phone]  . ondansetron (ZOFRAN) tablet 4 mg  4 mg Oral Q6H PRN Mansy, Jan A, MD       Or  . ondansetron [facility]) injection 4 mg  4 mg Intravenous Q6H PRN Mansy, Jan A, MD      . oxyCODONE (Oxy IR/ROXICODONE) immediate release tablet 5-10 mg  5-10 mg Oral Q3H PRN [place], MD   10 mg at 08/14/19 3532  . pantoprazole (PROTONIX) EC tablet 40 mg  40 mg Oral Daily Mansy, Jan A, MD   40 mg at 08/14/19 0839  . polyethylene glycol (MIRALAX / GLYCOLAX) packet 17 g  17 g Oral Daily Lavina Hamman, MD   17 g at 08/14/19 9924  . rosuvastatin (CRESTOR) tablet 20 mg  20 mg Oral Daily Mansy, Jan A, MD   20 mg at 08/13/19 1747  . senna-docusate (Senokot-S) tablet 1 tablet  1 tablet Oral BID Lavina Hamman, MD   1 tablet at 08/14/19 [phone]  . sodium phosphate (FLEET) 7-19 GM/118ML enema 1 enema  1 enema Rectal Daily PRN Mansy, Arvella Merles, MD   1 enema at 08/12/19 0005  . traZODone (DESYREL) tablet 25 mg  25 mg Oral QHS PRN Mansy, Jan A, MD   25 mg at 08/13/19 2120      Discharge Medications: Please see discharge summary for a list of discharge medications.  Relevant Imaging Results:  Relevant Lab Results:   Additional Information SSN:[id number]  Gerrianne Scale Celita Aron, LCSW

## 2019-08-14 NOTE — Progress Notes (Signed)
[facility] for Eliquis Indication: DVT  Allergies  Allergen Reactions  . Penicillins Anaphylaxis, Rash and Other (See Comments)  . Succinylcholine Chloride Anaphylaxis and Rash  . Keflex [Cephalexin] Rash  . Sulfa Antibiotics Rash and Other (See Comments)    Patient Measurements: Height: 6' 9"  (205.7 cm) Weight: (!) 353 lb 3.2 oz (160.2 kg) IBW/kg (Calculated) : 98.3 Heparin Dosing Weight: 135 kg   Vital Signs: Temp: 98.7 F (37.1 C) (03/30 1633) BP: 128/88 (03/30 1633) Pulse Rate: 91 (03/30 1633)  Labs: Recent Labs     0000 08/11/19 1847 08/12/19 0510 08/12/19 2257 08/13/19 0530 08/13/19 0842 08/13/19 1547 08/14/19 0006 08/14/19 0753  HGB   < >  --  14.1  --  13.5   < > 14.3 13.4  --   HCT   < >  --  45.4  --  42.1  --  45.0 41.6  --   PLT   < >  --  184  --  213  --  259 232  --   HEPARINUNFRC  --   --   --    < >  --    < > 0.66 0.48 0.46  CREATININE  --   --  0.70  --  0.87  --   --  1.01  --   TROPONINIHS  --  10  --   --   --   --   --   --   --    < > = values in this interval not displayed.    Estimated Creatinine Clearance: 127 mL/min (by C-G formula based on SCr of 1.01 mg/dL).   Medical History: Past Medical History:  Diagnosis Date  . 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 Diabetes ([place])   . GERD (gastroesophageal reflux disease)   . Hyperlipidemia   . Hypertension   . Obesity   . Sleep apnea     Medications:  Medications Prior to Admission  Medication Sig Dispense Refill Last Dose  . aspirin 81 MG tablet Take 1 tablet (81 mg total) by mouth daily. 30 tablet  Past Week at Unknown time  . carvedilol (COREG) 6.25 MG tablet TAKE 1 TABLET BY MOUTH TWICE A DAY WITH FOOD 60 tablet 0 Past Week at Unknown time  . DULoxetine (CYMBALTA) 30 MG capsule Take 30 mg by mouth daily.   Past Week at Unknown time  . enoxaparin (LOVENOX) 40 MG/0.4ML injection Inject 0.4 mLs (40 mg total)  into the skin daily for 14 days. 5.6 mL 0 08/11/2019 at Unknown time  . Exenatide ER (BYDUREON) 2 MG PEN Inject 2 mg into the skin once a week.   Past Week at Unknown time  . gabapentin (NEURONTIN) 300 MG capsule Take 3 capsules (900 mg total) by mouth 3 (three) times daily.   Past Week at Unknown time  . Insulin Glargine (BASAGLAR KWIKPEN) 100 UNIT/ML Inject 80 Units into the skin daily.   Unknown at Unknown  . losartan (COZAAR) 100 MG tablet Take 1 tablet (100 mg total) by mouth daily. 30 tablet 0 Unknown at Unknown  . Melatonin 10 MG TABS Take 10 mg by mouth at bedtime as needed (sleep).   prn at prn  . metFORMIN (GLUCOPHAGE) 500 MG tablet Take 1,000 mg by mouth 2 (two) times daily with a meal.   Unknown at Unknown  . Multiple Vitamin (MULTIVITAMIN WITH MINERALS) TABS tablet Take 1 tablet by  mouth daily.   Unknown at Unknown  . omeprazole (PRILOSEC) 20 MG capsule Take 20 mg by mouth daily.    Unknown at Unknown  . ondansetron (ZOFRAN) 4 MG tablet Take 1 tablet (4 mg total) by mouth every 8 (eight) hours as needed for nausea or vomiting. 30 tablet 0 prn at prn  . oxyCODONE (OXY IR/ROXICODONE) 5 MG immediate release tablet Take 1 tablet (5 mg total) by mouth every 4 (four) hours as needed. 40 tablet 0 prn at prn  . rosuvastatin (CRESTOR) 20 MG tablet Take 20 mg by mouth daily.   Unknown at Unknown  . Blood Glucose Monitoring Suppl (ONE TOUCH ULTRA SYSTEM KIT) w/Device KIT 1 kit by Does not apply route once. Dx. E11.9 1 each 0   . glucose blood test strip 1 each by Other route 4 (four) times daily. Use with One touch meter to check blood sugar. Dx E11.9 100 each 12   . Insulin Pen Needle (BD PEN NEEDLE NANO U/F) 32G X 4 MM MISC Inject 1 each as directed 3 (three) times daily. 300 each 3   . ONE TOUCH LANCETS MISC 1 each by Does not apply route 4 (four) times daily. Use with one touch meter to check blood sugar. Dx: E11.9 200 each 12     Assessment: Pharmacy consulted to dose heparin in this 64 year  old male with newly diagnosed DVT.  CrCl = 161.8 ml/min.   Pt received dose of lovenox 40 mg on 3/27 @ 2300.   Transitioning patient to apixaban.  Goal of Therapy:  Heparin level 0.3-0.7 units/ml Monitor platelets by anticoagulation protocol: Yes   Plan:  Apixaban 10 mg BID x 14 doses followed by apixaban 5 mg BID. Heparin drip to stop with first dose of apixaban. Discussed with nurse. Will follow CBC per protocol.  Dorena Bodo, PharmD 08/14/2019,6:40 PM

## 2019-08-14 NOTE — Progress Notes (Signed)
Occupational Therapy Treatment Patient Details Name: Dakotah Orrego MRN: [id number] DOB: [birth year] Today's Date: 08/14/2019    History of present illness Per MD notes: Pt is 64 yo male who presented to the emergency room with acute onset of presyncope while having a bowel movement.  MD assessment includes acute on chronic hypoxic respiratory failure, acute pulmonary edema/acute on chronic HFpEF, hypertensive urgency, near syncope, and RLE DVT.  Of note pt with recent R shoulder arthroscopy 08/07/19 with subacromial decompression and distal clavicle excision, biceps tenotomy, and rotator cuff repair.  PMH also includes OSA, HTN, GERD, DM, and TKA.   OT comments  Patient supine in bed and agreeable to therapy.  Patient continues to endorse dizziness when rolling, repositioning or attempting to sit up in bed.  Patient wishes to remain in bed at this time.  Completed education with patient targeting one-handed techniques to safely perform grooming and self feeding tasks.  Patient performs good carryover of education.  Covered hand/wrist HEP movement and patient also demonstrates good understanding of precautions.  Patient remains in bed with polar care in place.  Will continue to attempt to address donning/doffing immobilizer at EOB and pursue OOB activities.  Based on today's performance, follow up recommendation remains appropriate.     Follow Up Recommendations  SNF;Supervision - Intermittent(Will need assist/SPV for all OOB activities)    Equipment Recommendations  Toilet riser    Recommendations for Other Services      Precautions / Restrictions Precautions Precautions: Fall;Shoulder Shoulder Interventions: Shoulder sling/immobilizer;Shoulder abduction pillow Precaution Booklet Issued: No Precaution Comments: Per Dr. Mack Guise: "He will leave the sling off while resting in bed.   Patient will wear the sling when up with PT." Required Braces or Orthoses: Sling Restrictions Weight Bearing  Restrictions: Yes RUE Weight Bearing: Non weight bearing Other Position/Activity Restrictions: From recent admission: Per MD, prefers pt to be NWB to RUE, but would permit a "light lean" of R elbow on platform walker       Mobility Bed Mobility Overal bed mobility: Needs Assistance Bed Mobility: Sit to Supine;Rolling Rolling: +2 for physical assistance;+2 for safety/equipment;Mod assist   Supine to sit: HOB elevated;+2 for physical assistance;+2 for safety/equipment;Mod assist Sit to supine: HOB elevated;+2 for physical assistance;+2 for safety/equipment;Mod assist;Min assist   General bed mobility comments: For supine to sit, pt required mod assist for his trunk. For sit to supine, pt required min-mod assist for LE's and trunk. Pt required cueing and bed adjustments (reverse trendelenberg) for bed positioning.  Transfers                 General transfer comment: Unable to perform transfers this date.    Balance                            ADL either performed or assessed with clinical judgement   ADL Overall ADL's : Needs assistance/impaired Eating/Feeding: Set up                                     General ADL Comments: Discussed one handed techniques to improve ability to perform self care (i.e. grooming, self feeding) while maintaining precautions     Vision Patient Visual Report: No change from baseline     Perception     Praxis      Cognition Arousal/Alertness: Awake/alert Behavior During Therapy: WFL for tasks assessed/performed Overall Cognitive  Status: Within Functional Limits for tasks assessed                                          Exercises  Other Exercises Other Exercises: Provided education on one-handed techniques to improve ability to perform simple grooming tasks and self feeding. Verbalized and demonstrated understanding. Other Exercises: Follow up on hand/wrist HEP.  Patient demonstrates good  understanding.   Shoulder Instructions       General Comments Pt remains orthostatic and complaints of dizziness with rolling or attempting to sit up    Pertinent Vitals/ Pain       Pain Assessment: No/denies pain(No reports of pain. Polar care in place.) Pain Score: 2  Pain Location: 2/10 R shoulder pain at rest, when sitting EOB, severe R shoulder and back pain Pain Descriptors / Indicators: Aching;Sore;Grimacing;Moaning Pain Intervention(s): Limited activity within patient's tolerance;Monitored during session;Ice applied  Home Living                                          Prior Functioning/Environment              Frequency  Min 2X/week        Progress Toward Goals  OT Goals(current goals can now be found in the care plan section)  Progress towards OT goals: Progressing toward goals     Plan Discharge plan remains appropriate    Co-evaluation                 AM-PAC OT "6 Clicks" Daily Activity     Outcome Measure                    End of Session    OT Visit Diagnosis: Unsteadiness on feet (R26.81);Other abnormalities of gait and mobility (R26.89);Muscle weakness (generalized) (M62.81);History of falling (Z91.81)   Activity Tolerance Patient tolerated treatment well   Patient Left in bed;with call bell/phone within reach;with bed alarm set   Nurse Communication          Time: 2694-8546 OT Time Calculation (min): 14 min  Charges: OT General Charges $OT Visit: 1 Visit OT Treatments $Self Care/Home Management : 8-22 mins  Louanne Belton, MS, OTR/L 08/14/19, 3:51 PM

## 2019-08-14 NOTE — Progress Notes (Signed)
[facility] for Heparin  Indication: DVT  Allergies  Allergen Reactions  . Penicillins Anaphylaxis, Rash and Other (See Comments)  . Succinylcholine Chloride Anaphylaxis and Rash  . Keflex [Cephalexin] Rash  . Sulfa Antibiotics Rash and Other (See Comments)    Patient Measurements: Height: _0  (205.7 cm) Weight: (!) 353 lb 3.2 oz (160.2 kg) IBW/kg (Calculated) : 98.3 Heparin Dosing Weight: 135 kg   Vital Signs: Temp: 98.2 F (36.8 C) (03/30 0813) Temp Source: Oral (03/30 0544) BP: 102/63 (03/30 0813) Pulse Rate: 95 (03/30 0813)  Labs: Recent Labs    08/11/19 1645 08/11/19 1645 08/11/19 1847 08/12/19 0510 08/12/19 2257 08/13/19 0530 08/13/19 0842 08/13/19 1547 08/14/19 0006 08/14/19 0753  HGB 15.1   < >  --  14.1  --  13.5   < > 14.3 13.4  --   HCT 46.8   < >  --  45.4  --  42.1  --  45.0 41.6  --   PLT 242   < >  --  184  --  213  --  259 232  --   HEPARINUNFRC  --   --   --   --    < >  --    < > 0.66 0.48 0.46  CREATININE 0.71   < >  --  0.70  --  0.87  --   --  1.01  --   TROPONINIHS 8  --  10  --   --   --   --   --   --   --    < > = values in this interval not displayed.    Estimated Creatinine Clearance: 127 mL/min (by C-G formula based on SCr of 1.01 mg/dL).   Medical History: Past Medical History:  Diagnosis Date  . 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 Diabetes ([place])   . GERD (gastroesophageal reflux disease)   . Hyperlipidemia   . Hypertension   . Obesity   . Sleep apnea     Medications:  Medications Prior to Admission  Medication Sig Dispense Refill Last Dose  . aspirin 81 MG tablet Take 1 tablet (81 mg total) by mouth daily. 30 tablet  Past Week at Unknown time  . carvedilol (COREG) 6.25 MG tablet TAKE 1 TABLET BY MOUTH TWICE A DAY WITH FOOD 60 tablet 0 Past Week at Unknown time  . DULoxetine (CYMBALTA) 30 MG capsule Take 30 mg by mouth daily.   Past Week at  Unknown time  . enoxaparin (LOVENOX) 40 MG/0.4ML injection Inject 0.4 mLs (40 mg total) into the skin daily for 14 days. 5.6 mL 0 08/11/2019 at Unknown time  . Exenatide ER (BYDUREON) 2 MG PEN Inject 2 mg into the skin once a week.   Past Week at Unknown time  . gabapentin (NEURONTIN) 300 MG capsule Take 3 capsules (900 mg total) by mouth 3 (three) times daily.   Past Week at Unknown time  . Insulin Glargine (BASAGLAR KWIKPEN) 100 UNIT/ML Inject 80 Units into the skin daily.   Unknown at Unknown  . losartan (COZAAR) 100 MG tablet Take 1 tablet (100 mg total) by mouth daily. 30 tablet 0 Unknown at Unknown  . Melatonin 10 MG TABS Take 10 mg by mouth at bedtime as needed (sleep).   prn at prn  . metFORMIN (GLUCOPHAGE) 500 MG tablet Take 1,000 mg by mouth 2 (two) times daily with a meal.  Unknown at Unknown  . Multiple Vitamin (MULTIVITAMIN WITH MINERALS) TABS tablet Take 1 tablet by mouth daily.   Unknown at Unknown  . omeprazole (PRILOSEC) 20 MG capsule Take 20 mg by mouth daily.    Unknown at Unknown  . ondansetron (ZOFRAN) 4 MG tablet Take 1 tablet (4 mg total) by mouth every 8 (eight) hours as needed for nausea or vomiting. 30 tablet 0 prn at prn  . oxyCODONE (OXY IR/ROXICODONE) 5 MG immediate release tablet Take 1 tablet (5 mg total) by mouth every 4 (four) hours as needed. 40 tablet 0 prn at prn  . rosuvastatin (CRESTOR) 20 MG tablet Take 20 mg by mouth daily.   Unknown at Unknown  . Blood Glucose Monitoring Suppl (ONE TOUCH ULTRA SYSTEM KIT) w/Device KIT 1 kit by Does not apply route once. Dx. E11.9 1 each 0   . glucose blood test strip 1 each by Other route 4 (four) times daily. Use with One touch meter to check blood sugar. Dx E11.9 100 each 12   . Insulin Pen Needle (BD PEN NEEDLE NANO U/F) 32G X 4 MM MISC Inject 1 each as directed 3 (three) times daily. 300 each 3   . ONE TOUCH LANCETS MISC 1 each by Does not apply route 4 (four) times daily. Use with one touch meter to check blood sugar. Dx:  E11.9 200 each 12     Assessment: Pharmacy consulted to dose heparin in this 64 year old male with newly diagnosed DVT.  CrCl = 161.8 ml/min.   Pt received dose of lovenox 40 mg on 3/27 @ 2300.   Goal of Therapy:  Heparin level 0.3-0.7 units/ml Monitor platelets by anticoagulation protocol: Yes   Plan:  3/30 0753 HL 0.46 therapeutic x 3, CBC stable. Continue heparin drip at 1800 units/hr. Recheck HL tomorrow with AM labs.  CBC daily.  [place] Resident 08/14/2019,8:33 AM

## 2019-08-14 NOTE — Progress Notes (Signed)
[place] for Heparin  Indication: DVT  Allergies  Allergen Reactions  . Penicillins Anaphylaxis, Rash and Other (See Comments)  . Succinylcholine Chloride Anaphylaxis and Rash  . Keflex [Cephalexin] Rash  . Sulfa Antibiotics Rash and Other (See Comments)    Patient Measurements: Height: 6' 9"  (205.7 cm) Weight: (!) 359 lb 4.5 oz (163 kg) IBW/kg (Calculated) : 98.3 Heparin Dosing Weight: 135 kg   Vital Signs: Temp: 98.6 F (37 C) (03/29 1915) Temp Source: Oral (03/29 1541) BP: 106/92 (03/29 1915) Pulse Rate: 88 (03/29 1915)  Labs: Recent Labs    08/11/19 1645 08/11/19 1645 08/11/19 1847 08/12/19 0510 08/12/19 2257 08/13/19 0530 08/13/19 0530 08/13/19 0842 08/13/19 1547 08/14/19 0006  HGB 15.1   < >  --  14.1  --  13.5   < >  --  14.3 13.4  HCT 46.8   < >  --  45.4  --  42.1  --   --  45.0 41.6  PLT 242   < >  --  184  --  213  --   --  259 232  HEPARINUNFRC  --   --   --   --    < >  --   --  0.74* 0.66 0.48  CREATININE 0.71   < >  --  0.70  --  0.87  --   --   --  1.01  TROPONINIHS 8  --  10  --   --   --   --   --   --   --    < > = values in this interval not displayed.    Estimated Creatinine Clearance: 128.1 mL/min (by C-G formula based on SCr of 1.01 mg/dL).   Medical History: Past Medical History:  Diagnosis Date  . 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 Diabetes ([place])   . GERD (gastroesophageal reflux disease)   . Hyperlipidemia   . Hypertension   . Obesity   . Sleep apnea     Medications:  Medications Prior to Admission  Medication Sig Dispense Refill Last Dose  . aspirin 81 MG tablet Take 1 tablet (81 mg total) by mouth daily. 30 tablet  Past Week at Unknown time  . carvedilol (COREG) 6.25 MG tablet TAKE 1 TABLET BY MOUTH TWICE A DAY WITH FOOD 60 tablet 0 Past Week at Unknown time  . DULoxetine (CYMBALTA) 30 MG capsule Take 30 mg by mouth daily.   Past Week at Unknown  time  . enoxaparin (LOVENOX) 40 MG/0.4ML injection Inject 0.4 mLs (40 mg total) into the skin daily for 14 days. 5.6 mL 0 08/11/2019 at Unknown time  . Exenatide ER (BYDUREON) 2 MG PEN Inject 2 mg into the skin once a week.   Past Week at Unknown time  . gabapentin (NEURONTIN) 300 MG capsule Take 3 capsules (900 mg total) by mouth 3 (three) times daily.   Past Week at Unknown time  . Insulin Glargine (BASAGLAR KWIKPEN) 100 UNIT/ML Inject 80 Units into the skin daily.   Unknown at Unknown  . losartan (COZAAR) 100 MG tablet Take 1 tablet (100 mg total) by mouth daily. 30 tablet 0 Unknown at Unknown  . Melatonin 10 MG TABS Take 10 mg by mouth at bedtime as needed (sleep).   prn at prn  . metFORMIN (GLUCOPHAGE) 500 MG tablet Take 1,000 mg by mouth 2 (two) times daily with a meal.  Unknown at Unknown  . Multiple Vitamin (MULTIVITAMIN WITH MINERALS) TABS tablet Take 1 tablet by mouth daily.   Unknown at Unknown  . omeprazole (PRILOSEC) 20 MG capsule Take 20 mg by mouth daily.    Unknown at Unknown  . ondansetron (ZOFRAN) 4 MG tablet Take 1 tablet (4 mg total) by mouth every 8 (eight) hours as needed for nausea or vomiting. 30 tablet 0 prn at prn  . oxyCODONE (OXY IR/ROXICODONE) 5 MG immediate release tablet Take 1 tablet (5 mg total) by mouth every 4 (four) hours as needed. 40 tablet 0 prn at prn  . rosuvastatin (CRESTOR) 20 MG tablet Take 20 mg by mouth daily.   Unknown at Unknown  . Blood Glucose Monitoring Suppl (ONE TOUCH ULTRA SYSTEM KIT) w/Device KIT 1 kit by Does not apply route once. Dx. E11.9 1 each 0   . glucose blood test strip 1 each by Other route 4 (four) times daily. Use with One touch meter to check blood sugar. Dx E11.9 100 each 12   . Insulin Pen Needle (BD PEN NEEDLE NANO U/F) 32G X 4 MM MISC Inject 1 each as directed 3 (three) times daily. 300 each 3   . ONE TOUCH LANCETS MISC 1 each by Does not apply route 4 (four) times daily. Use with one touch meter to check blood sugar. Dx: E11.9  200 each 12     Assessment: Pharmacy consulted to dose heparin in this 64 year old male with newly diagnosed DVT.  CrCl = 161.8 ml/min.   Pt received dose of lovenox 40 mg on 3/27 @ 2300.   Goal of Therapy:  Heparin level 0.3-0.7 units/ml Monitor platelets by anticoagulation protocol: Yes   Plan:  3/30 0006 HL 0.48 therapeutic x 2, CBC stable. Continue heparin drip at 1800 units/hr. Recheck HL at 0800.  CBC daily.  Hart Robinsons, PharmD 08/14/2019,12:45 AM

## 2019-08-14 NOTE — TOC Initial Note (Signed)
Transition of Care [facility]) - Initial/Assessment Note    Patient Details  Name: Nathan Dean MRN: [id number] Date of Birth: 12/30/1955  Transition of Care [facility]) CM/SW Contact:    Eileen Stanford, LCSW Phone Number: 08/14/2019, 2:23 PM  Clinical Narrative:   CSW spoke with pt and pt's spouse at bedside. Pt is alert and oriented. Pt states he is from [facility] and they are doing a bed hold. Pt will return to [facility] at d/c. Pt will need EMS at d/c. CSW will continue to follow for medical stability.                Expected Discharge Plan: Skilled Nursing Facility Barriers to Discharge: Continued Medical Work up   Patient Goals and CMS Choice Patient states their goals for this hospitalization and ongoing recovery are:: to get back to [facility]   Choice offered to / list presented to : Patient  Expected Discharge Plan and Services Expected Discharge Plan: [place] In-house Referral: Clinical Social Work   Post Acute Care Choice: [place] Living arrangements for the past 2 months: [place]                                      Prior Living Arrangements/Services Living arrangements for the past 2 months: Single Family Home Lives with:: Spouse Patient language and need for interpreter reviewed:: Yes Do you feel safe going back to the place where you live?: Yes      Need for Family Participation in Patient Care: Yes (Comment) Care giver support system in place?: Yes (comment)   Criminal Activity/Legal Involvement Pertinent to Current Situation/Hospitalization: No - Comment as needed  Activities of Daily Living Home Assistive Devices/Equipment: Eyeglasses, Dentures (specify type), CPAP, Blood pressure cuff, CBG Meter(4 wheel) ADL Screening (condition at time of admission) Patient's cognitive ability adequate to safely complete daily activities?: Yes Is the patient deaf or have difficulty hearing?: No Does the patient have  difficulty seeing, even when wearing glasses/contacts?: No Does the patient have difficulty concentrating, remembering, or making decisions?: No Patient able to express need for assistance with ADLs?: Yes Does the patient have difficulty dressing or bathing?: Yes Independently performs ADLs?: No Communication: Independent Dressing (OT): Needs assistance Is this a change from baseline?: Pre-admission baseline Grooming: Needs assistance Is this a change from baseline?: Pre-admission baseline Feeding: Independent Bathing: Needs assistance Is this a change from baseline?: Pre-admission baseline Toileting: Needs assistance Is this a change from baseline?: Pre-admission baseline In/Out Bed: Needs assistance Is this a change from baseline?: Pre-admission baseline Walks in Home: Needs assistance Is this a change from baseline?: Pre-admission baseline Does the patient have difficulty walking or climbing stairs?: Yes Weakness of Legs: Both Weakness of Arms/Hands: None  Permission Sought/Granted Permission sought to share information with : Family Supports    Share Information with NAME: Pamala Hurry  Permission granted to share info w AGENCY: [facility]  Permission granted to share info w Relationship: spouse     Emotional Assessment Appearance:: Appears stated age Attitude/Demeanor/Rapport: Engaged Affect (typically observed): Accepting, Appropriate, Calm Orientation: : Oriented to Self, Oriented to Place, Oriented to  Time, Oriented to Situation Alcohol / Substance Use: Not Applicable Psych Involvement: No (comment)  Admission diagnosis:  Acute cor pulmonale (HCC) [I26.09] Hypoxia [R09.02] Near syncope [L89] Acute metabolic encephalopathy [Q11.94] Acute pulmonary edema (HCC) [J81.0] Patient Active Problem List  Diagnosis Date Noted  . Edema   . Acute metabolic encephalopathy 08/12/2019  . Acute pulmonary edema (HCC) 08/12/2019  . Acute cor pulmonale (HCC) 08/11/2019  . Oxygen  desaturation 08/08/2019  . S/P right rotator cuff repair 08/07/2019  . Hypoxia 10/22/2018  . Infection of right prepatellar bursa 10/18/2018  . Sepsis (HCC) 10/18/2018  . Chest congestion 07/20/2017  . Viral illness 07/20/2017  . Lower respiratory infection 07/12/2017  . Laryngitis, acute 07/12/2017  . Cough 07/12/2017  . Pleurisy 07/12/2017  . Depression 06/03/2015  . HTN (hypertension) 01/02/2015  . Hyperlipidemia 01/02/2015  . 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 Sleep apnea 09/21/2010  . Arthritis, degenerative 02/17/2009  . Hereditary and idiopathic peripheral neuropathy 11/21/2008  . AD (atopic dermatitis) 07/08/2008  . Acid reflux 04/05/2007   PCP:  Ethelda Chick, MD Pharmacy:   [facility] [phone] - [place], [place] - [address] [place] [address] Phone: [phone] Fax: [phone]     Social Determinants of Health (SDOH) Interventions    Readmission Risk Interventions Readmission Risk Prevention Plan 10/21/2018  Post Dischage Appt Complete  Medication Screening Complete  Transportation Screening Complete  Some recent data might be hidden

## 2019-08-14 NOTE — Progress Notes (Signed)
[facility] Progress Note  Patient: Nathan Dean    WUJ:[id number]  DOA: 08/11/2019     Date of Service: the patient was seen and examined on 08/14/2019  Chief Complaint  Patient presents with  . Near Syncope   Brief hospital course: Past medical history of HTN, HLD, type II DM, OSA noncompliant with CPAP.  Presented with complaints of near syncope while having a bowel movement.  Recent hospitalization for rotator cuff tear repair requiring SNF, discharged on 08/09/2019. Currently further plan is continue to monitor response to diuresis and evaluate for possible discharge on 08/15/2019 back to SNF.  Assessment and Plan: 1.  Acute on chronic hypoxic respiratory failure Acute pulmonary edema/acute on chronic HFpEF Hypertensive urgency OSA, noncompliant with CPAP Possible healthcare associated/aspiration pneumonia-ruled out Concern for sepsis POA given tachycardia and leukocytosis as well as fever-ruled out Patient presents with complaints of a near syncopal event while having a bowel movement. On arrival he was found to have elevated blood pressure, tachycardia and low-grade temp.  Requiring 5 LPM to maintain adequate saturation. At present I was able to wean him down to 2 LPM.  CT PE protocol was performed which is negative for pulmonary embolism. There is also no evidence of pneumonia and procalcitonin level is negative. EKG does not show any evidence of acute ischemia. Serial troponins are negative as well. Patient was started on IV Lasix. Continue  Holding Coreg and Cozaar in the setting of hypotension, now blood pressure better. Echocardiogram shows no acute abnormality. Continue to monitor on telemetry. Cardiology currently signed off. Patient is started on IV Levaquin. Stop as less likely infection.  So far cultures are negative.  No fever despite stopping antibiotics.  2.  Near syncope Constipation Near syncope in the setting of attempting bowel movement. Syncope  work-up was initiated. Continue bowel regimen. Monitor response.  3.  OSA compliant with CPAP. Pt was using CPAP religiously every night at home. At SNF it was no setup well per pt.  Currently on CPAP nightly. Monitor improvement.  4.  Type 2 diabetes mellitus, uncontrolled with hyperglycemia with peripheral neuropathy Continue sliding scale insulin.  Continue Neurontin.  5.  Hyperlipidemia Continue statin  6.  Recent shoulder surgery Continue oxycodone per home regimen. As needed morphine for severe pain  7.  GERD. Continue PPI.  8.  Lower extremity Doppler is positive for distal DVT. Given patient's increased risk for clot propagation shared decision-making process was initiated and patient was recommended to initiate anticoagulation. Orthopedic also cleared the patient to resume anticoagulation on therapeutic basis. Currently patient is on IV heparin tolerating it well. Discussed with wife and transition to oral Eliquis.  Body mass index is 37.85 kg/m.   Diet: Cardiac diet DVT Prophylaxis: Therapeutic Anticoagulation with Eliquis   Advance goals of care discussion: Full code  Family Communication: family was present at bedside, at the time of interview.  The pt provided permission to discuss medical plan with the family. Opportunity was given to ask question and all questions were answered satisfactorily.   Disposition:  Pt is from SNF, admitted with acute CHF and DVT, still has symptoms of acute CHF requiring IV diuresis, which precludes a safe discharge. Discharge to SNF, when tomorrow 08/15/2019 pending medical improvement.  Subjective: Continues to have shortness of breath but improving.  No nausea no vomiting no fever no chills.  No chest pain.  Physical Exam: General:  alert oriented to time, place, and person.  Appear in mild distress, affect appropriate Eyes: PERRL  ENT: Oral Mucosa Clear, moist  Neck: no JVD,  Cardiovascular: S1 and S2 Present, no  Murmur,  Respiratory: increased respiratory effort, Bilateral Air entry equal and Decreased, bilateral  Crackles, no wheezes Abdomen: Bowel Sound present, Soft and no tenderness,  Skin: no rash Extremities: bilateral  Pedal edema, no calf tenderness Neurologic: without any new focal findings  Gait not checked due to patient safety concerns  Vitals:   08/14/19 0544 08/14/19 0813 08/14/19 1124 08/14/19 1633  BP: 109/61 102/63 129/74 128/88  Pulse: 90 95 (!) 105 91  Resp: 18 18 20 18   Temp: 98.4 F (36.9 C) 98.2 F (36.8 C) 98.8 F (37.1 C) 98.7 F (37.1 C)  TempSrc: Oral     SpO2: 95% (!) 85% 92% 92%  Weight: (!) 160.2 kg     Height:        Intake/Output Summary (Last 24 hours) at 08/14/2019 1828 Last data filed at 08/14/2019 1638 Gross per 24 hour  Intake 240 ml  Output 2275 ml  Net -2035 ml   Filed Weights   08/12/19 0200 08/13/19 0604 08/14/19 0544  Weight: (!) 163.4 kg (!) 163 kg (!) 160.2 kg    Data Reviewed: I have personally reviewed and interpreted daily labs, tele strips, imagings as discussed above. I reviewed all nursing notes, pharmacy notes, vitals, pertinent old records I have discussed plan of care as described above with RN and patient/family.  CBC: Recent Labs  Lab 08/11/19 1645 08/12/19 0510 08/13/19 0530 08/13/19 1547 08/14/19 0006  WBC 11.8* 10.2 9.9 10.5 8.8  NEUTROABS 9.3*  --   --   --   --   HGB 15.1 14.1 13.5 14.3 13.4  HCT 46.8 45.4 42.1 45.0 41.6  MCV 87.3 91.0 87.9 88.9 88.5  PLT 242 184 213 259 093   Basic Metabolic Panel: Recent Labs  Lab 08/09/19 0519 08/11/19 1645 08/12/19 0510 08/13/19 0530 08/14/19 0006  NA 142 139 139 136 136  K 3.7 4.3 3.9 4.0 3.6  CL 108 101 102 97* 94*  CO2 29 31 29 29  32  GLUCOSE 107* 237* 157* 175* 165*  BUN 16 18 19  25* 30*  CREATININE 0.80 0.71 0.70 0.87 1.01  CALCIUM 8.4* 9.0 8.5* 8.3* 8.4*  MG  --   --   --  2.1 2.3    Studies: No results found.  Scheduled Meds: . aspirin EC  81 mg  Oral Daily  . DULoxetine  30 mg Oral Daily  . feeding supplement (ENSURE ENLIVE)  237 mL Oral BID BM  . furosemide  40 mg Intravenous Daily  . gabapentin  300 mg Oral TID  . guaiFENesin  600 mg Oral BID  . insulin aspart  0-15 Units Subcutaneous TID PC & HS  . insulin glargine  80 Units Subcutaneous Daily  . multivitamin with minerals  1 tablet Oral Daily  . pantoprazole  40 mg Oral Daily  . polyethylene glycol  17 g Oral Daily  . rosuvastatin  20 mg Oral Daily  . senna-docusate  1 tablet Oral BID   Continuous Infusions: . heparin 1,800 Units/hr (08/14/19 0837)   PRN Meds: acetaminophen **OR** acetaminophen, bisacodyl, chlorpheniramine-HYDROcodone, labetalol, magnesium hydroxide, melatonin, morphine injection, ondansetron **OR** ondansetron (ZOFRAN) IV, [DISCONTINUED] oxyCODONE-acetaminophen **AND** oxyCODONE, sodium phosphate, traZODone  Time spent: 35 minutes  Author: Berle Mull, MD [facility] 08/14/2019 6:28 PM  To reach On-call, see care teams to locate the attending and reach out to them via [email or web address]. If 7PM-7AM, please contact night-coverage  If you still have difficulty reaching the attending provider, please page the [facility] (Director on Call) for [facility] on amion for assistance.

## 2019-08-14 NOTE — Care Management Important Message (Signed)
Important Message  Patient Details  Name: Peder Allums MRN: [id number] Date of Birth: 11-07-1955   Medicare Important Message Given:  Yes     Bernadette Hoit 08/14/2019, 10:19 AM

## 2019-08-15 ENCOUNTER — Encounter: Payer: Self-pay | Admitting: Family Medicine

## 2019-08-15 ENCOUNTER — Inpatient Hospital Stay: Payer: Medicare Other

## 2019-08-15 DIAGNOSIS — I2609 Other pulmonary embolism with acute cor pulmonale: Principal | ICD-10-CM

## 2019-08-15 LAB — CBC
HCT: 39.9 % (ref 39.0–52.0)
Hemoglobin: 12.6 g/dL — ABNORMAL LOW (ref 13.0–17.0)
MCH: 27.9 pg (ref 26.0–34.0)
MCHC: 31.6 g/dL (ref 30.0–36.0)
MCV: 88.5 fL (ref 80.0–100.0)
Platelets: 235 10*3/uL (ref 150–400)
RBC: 4.51 MIL/uL (ref 4.22–5.81)
RDW: 15.5 % (ref 11.5–15.5)
WBC: 8.2 10*3/uL (ref 4.0–10.5)
nRBC: 0 % (ref 0.0–0.2)

## 2019-08-15 LAB — BASIC METABOLIC PANEL
Anion gap: 9 (ref 5–15)
BUN: 32 mg/dL — ABNORMAL HIGH (ref 8–23)
CO2: 31 mmol/L (ref 22–32)
Calcium: 8.7 mg/dL — ABNORMAL LOW (ref 8.9–10.3)
Chloride: 96 mmol/L — ABNORMAL LOW (ref 98–111)
Creatinine, Ser: 0.8 mg/dL (ref 0.61–1.24)
GFR calc Af Amer: >60 mL/min (ref >60)
GFR calc non Af Amer: >60 mL/min (ref >60)
Glucose, Bld: 182 mg/dL — ABNORMAL HIGH (ref 70–99)
Potassium: 4.2 mmol/L (ref 3.5–5.1)
Sodium: 136 mmol/L (ref 135–145)

## 2019-08-15 LAB — BLOOD GAS, ARTERIAL
Acid-Base Excess: 8.3 mmol/L — ABNORMAL HIGH (ref 0.0–2.0)
Bicarbonate: 34 mmol/L — ABNORMAL HIGH (ref 20.0–28.0)
FIO2: 0.32
O2 Saturation: 95 %
Patient temperature: 37
pCO2 arterial: 50 mmHg — ABNORMAL HIGH (ref 32.0–48.0)
pH, Arterial: 7.44 (ref 7.350–7.450)
pO2, Arterial: 73 mmHg — ABNORMAL LOW (ref 83.0–108.0)

## 2019-08-15 LAB — MAGNESIUM: Magnesium: 2.4 mg/dL (ref 1.7–2.4)

## 2019-08-15 LAB — GLUCOSE, CAPILLARY
Glucose-Capillary: 161 mg/dL — ABNORMAL HIGH (ref 70–99)
Glucose-Capillary: 255 mg/dL — ABNORMAL HIGH (ref 70–99)
Glucose-Capillary: 208 mg/dL — ABNORMAL HIGH (ref 70–99)
Glucose-Capillary: 186 mg/dL — ABNORMAL HIGH (ref 70–99)

## 2019-08-15 MED ORDER — CARVEDILOL 6.25 MG PO TABS
6.2500 mg | ORAL_TABLET | Freq: Two times a day (BID) | ORAL | Status: DC
  Administered 2019-08-15 – 2019-08-17 (×4): 6.25 mg via ORAL
  Filled 2019-08-15 (×4): qty 1

## 2019-08-15 MED ORDER — TRAMADOL HCL 50 MG PO TABS
50.0000 mg | ORAL_TABLET | Freq: Four times a day (QID) | ORAL | Status: DC | PRN
  Administered 2019-08-15 – 2019-08-16 (×2): 50 mg via ORAL
  Filled 2019-08-15 (×2): qty 1

## 2019-08-15 NOTE — Progress Notes (Signed)
Inpatient Diabetes Program Recommendations  AACE/ADA: New Consensus Statement on Inpatient Glycemic Control (2015)  Target Ranges:  Prepandial:   less than 140 mg/dL      Peak postprandial:   less than 180 mg/dL (1-2 hours)      Critically ill patients:  140 - 180 mg/dL   Results for Nathan Dean, Nathan Dean (MRN [id number]) as of 08/15/2019 12:04  Ref. Range 08/14/2019 08:15 08/14/2019 11:44 08/14/2019 16:38 08/14/2019 21:25  Glucose-Capillary Latest Ref Range: 70 - 99 mg/dL 734 (H)  5 units NOVOLOG +  80 units LANTUS  147 (H)  2 units NOVOLOG  218 (H)  5 units NOVOLOG  181 (H)  3 units NOVOLOG    Results for Nathan Dean, Nathan Dean (MRN [id number]) as of 08/15/2019 12:04  Ref. Range 08/15/2019 07:35 08/15/2019 11:50  Glucose-Capillary Latest Ref Range: 70 - 99 mg/dL 973 (H)  3 units NOVOLOG +  80 units LANTUS 255 (H)     Home DM Meds: Basaglar 80 units Daily        Bydureon 2 mg Qweek       Metformin 1000 mg BID    Current Orders: Lantus 80 units Daily      Novolog Moderate Correction Scale/ SSI (0-15 units) TID AC + HS    MD- Please consider adding Novolog Meal Coverage to pt's current inpatient insulin regimen:  Novolog 4 units TID with meals  (Please add the following Hold Parameters: Hold if pt eats <50% of meal, Hold if pt NPO)    --Will follow patient during hospitalization--  Ambrose Finland RN, MSN, CDE Diabetes Coordinator Inpatient Glycemic Control Team Team Pager: [phone] (8a-5p)

## 2019-08-15 NOTE — Progress Notes (Signed)
Subjective:  POD #8 s/p right shoulder rotator cuff repair.   Patient reports right shoulder pain as mild to moderate.  Patient's wife is at the bedside.  Patient had episode of confusion this AM.  CT scan of the head ordered.  Patient had a bowel movement and continues to have flatus.  Objective:   VITALS:   Vitals:   08/15/19 0734 08/15/19 0900 08/15/19 1139 08/15/19 1603  BP: (!) 162/78  135/81 (!) 147/98  Pulse: 97  98 (!) 103  Resp: 18  20 18   Temp: 98.6 F (37 C)  98.4 F (36.9 C) 98.7 F (37.1 C)  TempSrc: Oral  Oral Oral  SpO2: 95%  91% 97%  Weight:  (!) 155.4 kg    Height:        PHYSICAL EXAM: Right upper extremity Neurovascular intact Sensation intact distally Intact pulses distally Incision: no drainage No cellulitis present Compartment soft  LABS  Results for orders placed or performed during the hospital encounter of 08/11/19 (from the past 24 hour(s))  Glucose, capillary     Status: Abnormal   Collection Time: 08/14/19  4:38 PM  Result Value Ref Range   Glucose-Capillary 218 (H) 70 - 99 mg/dL  Glucose, capillary     Status: Abnormal   Collection Time: 08/14/19  9:25 PM  Result Value Ref Range   Glucose-Capillary 181 (H) 70 - 99 mg/dL  Basic metabolic panel     Status: Abnormal   Collection Time: 08/15/19  5:20 AM  Result Value Ref Range   Sodium 136 135 - 145 mmol/L   Potassium 4.2 3.5 - 5.1 mmol/L   Chloride 96 (L) 98 - 111 mmol/L   CO2 31 22 - 32 mmol/L   Glucose, Bld 182 (H) 70 - 99 mg/dL   BUN 32 (H) 8 - 23 mg/dL   Creatinine, Ser 0.80 0.61 - 1.24 mg/dL   Calcium 8.7 (L) 8.9 - 10.3 mg/dL   GFR calc non Af Amer >60 >60 mL/min   GFR calc Af Amer >60 >60 mL/min   Anion gap 9 5 - 15  CBC     Status: Abnormal   Collection Time: 08/15/19  5:20 AM  Result Value Ref Range   WBC 8.2 4.0 - 10.5 K/uL   RBC 4.51 4.22 - 5.81 MIL/uL   Hemoglobin 12.6 (L) 13.0 - 17.0 g/dL   HCT 39.9 39.0 - 52.0 %   MCV 88.5 80.0 - 100.0 fL   MCH 27.9 26.0 - 34.0  pg   MCHC 31.6 30.0 - 36.0 g/dL   RDW 15.5 11.5 - 15.5 %   Platelets 235 150 - 400 K/uL   nRBC 0.0 0.0 - 0.2 %  Magnesium     Status: None   Collection Time: 08/15/19  5:20 AM  Result Value Ref Range   Magnesium 2.4 1.7 - 2.4 mg/dL  Glucose, capillary     Status: Abnormal   Collection Time: 08/15/19  7:35 AM  Result Value Ref Range   Glucose-Capillary 161 (H) 70 - 99 mg/dL  Glucose, capillary     Status: Abnormal   Collection Time: 08/15/19 11:50 AM  Result Value Ref Range   Glucose-Capillary 255 (H) 70 - 99 mg/dL  Blood gas, arterial     Status: Abnormal   Collection Time: 08/15/19 12:59 PM  Result Value Ref Range   FIO2 0.32    pH, Arterial 7.44 7.350 - 7.450   pCO2 arterial 50 (H) 32.0 - 48.0 mmHg  pO2, Arterial 73 (L) 83.0 - 108.0 mmHg   Bicarbonate 34.0 (H) 20.0 - 28.0 mmol/L   Acid-Base Excess 8.3 (H) 0.0 - 2.0 mmol/L   O2 Saturation 95.0 %   Patient temperature 37.0    Collection site RIGHT RADIAL    Sample type ARTERIAL DRAW    Allens test (pass/fail) PASS PASS    CT HEAD WO CONTRAST  Result Date: 08/15/2019 CLINICAL DATA:  Near syncope during a bowel movement 4 days ago. EXAM: CT HEAD WITHOUT CONTRAST TECHNIQUE: Contiguous axial images were obtained from the base of the skull through the vertex without intravenous contrast. COMPARISON:  None. 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 No evidence of hemorrhage, infarct, mass lesion, midline shift or abnormal extra-axial fluid collection is identified. No pneumocephalus. Vascular: No hyperdense vessel or unexpected calcification. Skull: Intact.  No focal lesion. Sinuses/Orbits: Negative. Other: None. IMPRESSION: Findings compatible with communicating hydrocephalus. The exam is otherwise negative. Electronically Signed   By: Drusilla Kanner M.D.   On: 08/15/2019 14:58    Assessment/Plan:     Active Problems:   Acute cor pulmonale (HCC)   Acute  metabolic encephalopathy   Acute pulmonary edema (HCC)   Edema  Patient's CT scan of the head was negative for stroke or hemorrhage.   CT scanning of the chest showed no evidence of pulmonary embolism or pneumonia.  ABG has not shown any acute respiratory acidosis.  EKG showed no evidence of acute ischemia.  Serial troponins have been negative.  Echocardiogram shows no acute abnormality.  Patient will continue on telemetry.  Patient will continue with physical therapy as tolerated.  He has had issues with orthostatic hypotension.  Continue tight blood sugar control.  Continue current pain management.  For moderate pain I recommend the patient take tramadol and use oxycodone only for severe pain.  Reducing narcotic use will likely help the patient's mental status.  Continue Eliquis for DVT prophylaxis.  I will continue to follow.   Juanell Fairly , MD 08/15/2019, 4:36 PM

## 2019-08-15 NOTE — Progress Notes (Signed)
ABG resulted.  Patient still remains lethargic and confused.  Dr. Myriam Forehand notified.  STAT CT scan of head ordered.

## 2019-08-15 NOTE — Progress Notes (Signed)
Patient still confused and acting more lethargic than he was this AM.  Notified respiratory to help with CPAP.  CPAP not working appropriately.  Patient currently on 3LNC.  Spoke to Dr. Myriam Forehand to see if we could order stat ABG.    ABG ordered.

## 2019-08-15 NOTE — Progress Notes (Signed)
MD notified of CT scan results.

## 2019-08-15 NOTE — Progress Notes (Signed)
Physical Therapy Treatment Patient Details Name: Nathan Dean MRN: [id number] DOB: Sep 08, 1955 Today's Date: 08/15/2019    History of Present Illness Per MD notes: Pt is 64 yo male who presented to the emergency room with acute onset of presyncope while having a bowel movement.  MD assessment includes acute on chronic hypoxic respiratory failure, acute pulmonary edema/acute on chronic HFpEF, hypertensive urgency, near syncope, and RLE DVT.  Of note pt with recent R shoulder arthroscopy 08/07/19 with subacromial decompression and distal clavicle excision, biceps tenotomy, and rotator cuff repair.  PMH also includes OSA, HTN, GERD, DM, and TKA.    PT Comments    Pt presented with confusion and asked multiple questions about certain noises, his current location, and where his wife was/where I came from. Pt stated that he felt "out of it" and was aware that he was confused but did not know why. Per nsg, pt scheduled for stat CT scan and pt should remain in supine. Session was limited to supine exercises which pt tolerated well. Pt will benefit from PT services in a SNF setting upon discharge to safely address deficits listed in patient problem list for decreased caregiver assistance and eventual return to PLOF.     Follow Up Recommendations  SNF     Equipment Recommendations  3in1 (PT)(bariatric)    Recommendations for Other Services       Precautions / Restrictions Precautions Precautions: Fall;Shoulder Shoulder Interventions: Shoulder sling/immobilizer;Shoulder abduction pillow Precaution Booklet Issued: No Precaution Comments: Per Dr. Martha Clan: "He will leave the sling off while resting in bed.   Patient will wear the sling when up with PT." Required Braces or Orthoses: Sling Restrictions Weight Bearing Restrictions: Yes RUE Weight Bearing: Non weight bearing Other Position/Activity Restrictions: From recent admission: Per MD, prefers pt to be NWB to RUE, but would permit a "light  lean" of R elbow on platform walker    Mobility  Bed Mobility               General bed mobility comments: Per nsg, pt has stat CT scan scheduled and should not sit up EOB  Transfers                    Ambulation/Gait                 Stairs             Wheelchair Mobility    Modified Rankin (Stroke Patients Only)       Balance                                            Cognition Arousal/Alertness: Lethargic Behavior During Therapy: (confused) Overall Cognitive Status: Impaired/Different from baseline                                 General Comments: Pt had increased confusion and asked author questions about where I came from, where his wife was, where he was, etc. Pt reiterated multiple times that he was very confused      Exercises Total Joint Exercises Ankle Circles/Pumps: AROM;Strengthening;Both;10 reps Quad Sets: Strengthening;Both;10 reps Gluteal Sets: Strengthening;Both;10 reps Hip ABduction/ADduction: AAROM;Both;10 reps Straight Leg Raises: AAROM;Both;10 reps General Exercises - Upper Extremity Shoulder Flexion: AROM;Strengthening;Left;10 reps;Supine Elbow Flexion: AROM;Strengthening;10 reps;Supine;Left    General Comments  Pertinent Vitals/Pain Pain Assessment: No/denies pain    Home Living                      Prior Function            PT Goals (current goals can now be found in the care plan section) Progress towards PT goals: Progressing toward goals    Frequency    7X/week      PT Plan Current plan remains appropriate    Co-evaluation              AM-PAC PT "6 Clicks" Mobility   Outcome Measure  Help needed turning from your back to your side while in a flat bed without using bedrails?: A Lot Help needed moving from lying on your back to sitting on the side of a flat bed without using bedrails?: A Lot Help needed moving to and from a bed to a  chair (including a wheelchair)?: Total Help needed standing up from a chair using your arms (e.g., wheelchair or bedside chair)?: Total Help needed to walk in hospital room?: Total Help needed climbing 3-5 steps with a railing? : Total 6 Click Score: 8    End of Session Equipment Utilized During Treatment: Gait belt;Oxygen Activity Tolerance: Patient tolerated treatment well Patient left: in bed;with call bell/phone within reach;with bed alarm set Nurse Communication: Mobility status;Precautions;Weight bearing status PT Visit Diagnosis: Other abnormalities of gait and mobility (R26.89);Muscle weakness (generalized) (M62.81);Pain Pain - Right/Left: Right Pain - part of body: Shoulder     Time: 1341-1405 PT Time Calculation (min) (ACUTE ONLY): 24 min  Charges:                        Annabelle Harman, SPT 08/15/19 3:08 PM

## 2019-08-15 NOTE — Progress Notes (Signed)
Patient is confused and asking where he is over and over again.  He is not running a fever but has chills and hotflashes on and off.  His vitals are stable.  Dr. Myriam Forehand aware.

## 2019-08-15 NOTE — Progress Notes (Addendum)
Progress Note    Nathan Dean  UVO:[id number] DOB: 10/11/1955  DOA: 08/11/2019 PCP: Ethelda Chick, MD      Brief Narrative:    Medical records reviewed and are as summarized below:  Nathan Dean is an 64 y.o. male with past medical history of HTN, HLD, type II DM, OSA noncompliant with CPAP. Presented with complaints of near syncope while having a bowel movement. Recent hospitalization for rotator cuff tear repair requiring SNF, discharged on 08/09/2019.      Assessment/Plan:   Active Problems:   Acute cor pulmonale (HCC)   Acute metabolic encephalopathy   Acute pulmonary edema (HCC)   Edema   1. Acute on chronic hypoxic respiratory failure Acute pulmonary edema/acute on chronic HFpEF Hypertensive urgency OSA, noncompliant with CPAP  CT PE protocol was performed which is negative for pulmonary embolism. There is also no evidence of pneumonia and procalcitonin level is negative. EKG does not show any evidence of acute ischemia. Serial troponins are negative as well. ContinueIV Lasix Resume Coreg. Echocardiogramshows no acute abnormality. Continue to monitor on telemetry. Cardiology currently signed off.  2. Near syncope Constipation Near syncope in the setting of attempting bowel movement. Probable vasovagal syncope Continue bowel regimen.  3. OSA compliant with CPAP. Continue CPAP at night  4. Type 2 diabetes mellitus, uncontrolled with hyperglycemia with peripheral neuropathy Continue sliding scale insulin. Continue Neurontin.  5. Hyperlipidemia Continue statin  6. Recent shoulder surgery Continue oxycodone per home regimen. As needed morphine for severe pain  7. GERD. Continue PPI.  8.Lower extremity Doppler is positive for distal DVT. Continue Eliquis  9.  Worsening confusion/lethargy:  Probably due to delirium.  ABG did not show any acute respiratory acidosis. CT head showed communicating hydrocephalus. Obtain  MR brain for further evaluation.  Consult neurologist to assist with management. His wife said patient has been confused intermittently for the past 2 months and she also mentioned that patient has had recurrent falls at home.  Body mass index is 36.71 kg/m.  (Morbid obesity)   Family Communication/Anticipated D/C date and plan/Code Status   DVT prophylaxis: Eliquis Code Status: Full code Family Communication: Plan discussed with patient's wife Disposition Plan: Patient is from SNF.  Plan to discharge to SNF when mental status improves      Subjective:   Nurse reported that patient has been more confused and lethargic today.  Patient could not provide any history during my encounter.  Objective:    Vitals:   08/15/19 0447 08/15/19 0734 08/15/19 0900 08/15/19 1139  BP: 119/80 (!) 162/78  135/81  Pulse: 98 97  98  Resp:  18  20  Temp: 99 F (37.2 C) 98.6 F (37 C)  98.4 F (36.9 C)  TempSrc: Oral Oral  Oral  SpO2: 92% 95%  91%  Weight:   (!) 155.4 kg   Height:        Intake/Output Summary (Last 24 hours) at 08/15/2019 1437 Last data filed at 08/15/2019 1300 Gross per 24 hour  Intake 960 ml  Output 2550 ml  Net -1590 ml   Filed Weights   08/13/19 0604 08/14/19 0544 08/15/19 0900  Weight: (!) 163 kg (!) 160.2 kg (!) 155.4 kg    Exam:  GEN: NAD SKIN: No rash EYES: EOMI ENT: MMM CV: RRR PULM: CTA B ABD: soft, ND, NT, +BS CNS: Drowsy but arousable, disoriented to place and time, non focal EXT: b/l leg edema  (1+ ), no tenderness   Data Reviewed:  I have personally reviewed following labs and imaging studies:  Labs: Labs show the following:   Basic Metabolic Panel: Recent Labs  Lab 08/11/19 1645 08/11/19 1645 08/12/19 0510 08/12/19 0510 08/13/19 0530 08/13/19 0530 08/14/19 0006 08/15/19 0520  NA 139  --  139  --  136  --  136 136  K 4.3   < > 3.9   < > 4.0   < > 3.6 4.2  CL 101  --  102  --  97*  --  94* 96*  CO2 31  --  29  --  29  --  32  31  GLUCOSE 237*  --  157*  --  175*  --  165* 182*  BUN 18  --  19  --  25*  --  30* 32*  CREATININE 0.71  --  0.70  --  0.87  --  1.01 0.80  CALCIUM 9.0  --  8.5*  --  8.3*  --  8.4* 8.7*  MG  --   --   --   --  2.1  --  2.3 2.4   < > = values in this interval not displayed.   GFR Estimated Creatinine Clearance: 157.7 mL/min (by C-G formula based on SCr of 0.8 mg/dL). Liver Function Tests: Recent Labs  Lab 08/11/19 1645  AST 22  ALT 18  ALKPHOS 68  BILITOT 0.9  PROT 6.7  ALBUMIN 3.5   No results for input(s): LIPASE, AMYLASE in the last 168 hours. No results for input(s): AMMONIA in the last 168 hours. Coagulation profile No results for input(s): INR, PROTIME in the last 168 hours.  CBC: Recent Labs  Lab 08/11/19 1645 08/11/19 1645 08/12/19 0510 08/13/19 0530 08/13/19 1547 08/14/19 0006 08/15/19 0520  WBC 11.8*   < > 10.2 9.9 10.5 8.8 8.2  NEUTROABS 9.3*  --   --   --   --   --   --   HGB 15.1   < > 14.1 13.5 14.3 13.4 12.6*  HCT 46.8   < > 45.4 42.1 45.0 41.6 39.9  MCV 87.3   < > 91.0 87.9 88.9 88.5 88.5  PLT 242   < > 184 213 259 232 235   < > = values in this interval not displayed.   Cardiac Enzymes: No results for input(s): CKTOTAL, CKMB, CKMBINDEX, TROPONINI in the last 168 hours. BNP (last 3 results) No results for input(s): PROBNP in the last 8760 hours. CBG: Recent Labs  Lab 08/14/19 1144 08/14/19 1638 08/14/19 2125 08/15/19 0735 08/15/19 1150  GLUCAP 147* 218* 181* 161* 255*   D-Dimer: No results for input(s): DDIMER in the last 72 hours. Hgb A1c: No results for input(s): HGBA1C in the last 72 hours. Lipid Profile: No results for input(s): CHOL, HDL, LDLCALC, TRIG, CHOLHDL, LDLDIRECT in the last 72 hours. Thyroid function studies: No results for input(s): TSH, T4TOTAL, T3FREE, THYROIDAB in the last 72 hours.  Invalid input(s): FREET3 Anemia work up: No results for input(s): VITAMINB12, FOLATE, FERRITIN, TIBC, IRON, RETICCTPCT in the  last 72 hours. Sepsis Labs: Recent Labs  Lab 08/11/19 1847 08/11/19 1943 08/11/19 2202 08/12/19 0510 08/13/19 0530 08/13/19 1547 08/14/19 0006 08/15/19 0520  PROCALCITON <0.10  --   --   --   --   --   --   --   WBC  --   --   --    < > 9.9 10.5 8.8 8.2  LATICACIDVEN  --  2.0*  1.6  --   --   --   --   --    < > = values in this interval not displayed.    Microbiology Recent Results (from the past 240 hour(s))  SARS CORONAVIRUS 2 (TAT 6-24 HRS) Nasopharyngeal Nasopharyngeal Swab     Status: None   Collection Time: 08/09/19 11:36 AM   Specimen: Nasopharyngeal Swab  Result Value Ref Range Status   SARS Coronavirus 2 NEGATIVE NEGATIVE Final    Comment: (NOTE) SARS-CoV-2 target nucleic acids are NOT DETECTED. The SARS-CoV-2 RNA is generally detectable in upper and lower respiratory specimens during the acute phase of infection. Negative results do not preclude SARS-CoV-2 infection, do not rule out co-infections with other pathogens, and should not be used as the sole basis for treatment or other patient management decisions. Negative results must be combined with clinical observations, patient history, and epidemiological information. The expected result is Negative. Fact Sheet for Patients: HairSlick.no Fact Sheet for Healthcare Providers: quierodirigir.com This test is not yet approved or cleared by the [place] FDA and  has been authorized for detection and/or diagnosis of SARS-CoV-2 by FDA under an Emergency Use Authorization (EUA). This EUA will remain  in effect (meaning this test can be used) for the duration of the COVID-19 declaration under Section 56 4(b)(1) of the Act, 21 U.S.C. section 360bbb-3(b)(1), unless the authorization is terminated or revoked sooner. Performed at [facility], [address] [place], [place] [address]   Blood Culture (routine x 2)     Status: None (Preliminary result)    Collection Time: 08/11/19  7:43 PM   Specimen: BLOOD  Result Value Ref Range Status   Specimen Description BLOOD BLOOD LEFT FOREARM  Final   Special Requests   Final    BOTTLES DRAWN AEROBIC AND ANAEROBIC Blood Culture adequate volume   Culture   Final    NO GROWTH 4 DAYS Performed at [facility], [address], [place], [place] [address]    Report Status PENDING  Incomplete  Blood Culture (routine x 2)     Status: None (Preliminary result)   Collection Time: 08/11/19  7:43 PM   Specimen: BLOOD  Result Value Ref Range Status   Specimen Description BLOOD LEFT ANTECUBITAL  Final   Special Requests   Final    BOTTLES DRAWN AEROBIC AND ANAEROBIC Blood Culture adequate volume   Culture   Final    NO GROWTH 4 DAYS Performed at [facility], [address], [place], [place] [address]    Report Status PENDING  Incomplete  Respiratory Panel by RT PCR (Flu A&B, Covid) - Nasopharyngeal Swab     Status: None   Collection Time: 08/11/19  7:43 PM   Specimen: Nasopharyngeal Swab  Result Value Ref Range Status   SARS Coronavirus 2 by RT PCR NEGATIVE NEGATIVE Final    Comment: (NOTE) SARS-CoV-2 target nucleic acids are NOT DETECTED. The SARS-CoV-2 RNA is generally detectable in upper respiratoy specimens during the acute phase of infection. The lowest concentration of SARS-CoV-2 viral copies this assay can detect is 131 copies/mL. A negative result does not preclude SARS-Cov-2 infection and should not be used as the sole basis for treatment or other patient management decisions. A negative result may occur with  improper specimen collection/handling, submission of specimen other than nasopharyngeal swab, presence of viral mutation(s) within the areas targeted by this assay, and inadequate number of viral copies (<131 copies/mL). A negative result must be combined with  clinical observations, patient history, and epidemiological information. The expected result is  Negative. Fact Sheet for Patients:  https://www.moore.com/ Fact Sheet for Healthcare Providers:  https://www.young.biz/ This test is not yet ap proved or cleared by the [place] FDA and  has been authorized for detection and/or diagnosis of SARS-CoV-2 by FDA under an Emergency Use Authorization (EUA). This EUA will remain  in effect (meaning this test can be used) for the duration of the COVID-19 declaration under Section 564(b)(1) of the Act, 21 U.S.C. section 360bbb-3(b)(1), unless the authorization is terminated or revoked sooner.    Influenza A by PCR NEGATIVE NEGATIVE Final   Influenza B by PCR NEGATIVE NEGATIVE Final    Comment: (NOTE) The Xpert Xpress SARS-CoV-2/FLU/RSV assay is intended as an aid in  the diagnosis of influenza from Nasopharyngeal swab specimens and  should not be used as a sole basis for treatment. Nasal washings and  aspirates are unacceptable for Xpert Xpress SARS-CoV-2/FLU/RSV  testing. Fact Sheet for Patients: https://www.moore.com/ Fact Sheet for Healthcare Providers: https://www.young.biz/ This test is not yet approved or cleared by the [place] FDA and  has been authorized for detection and/or diagnosis of SARS-CoV-2 by  FDA under an Emergency Use Authorization (EUA). This EUA will remain  in effect (meaning this test can be used) for the duration of the  Covid-19 declaration under Section 564(b)(1) of the Act, 21  U.S.C. section 360bbb-3(b)(1), unless the authorization is  terminated or revoked. Performed at [facility], [address] [place], [place] [address]     Procedures and diagnostic studies:  No results found.  Medications:   . apixaban  10 mg Oral BID   Followed by  . [START ON 08/21/2019] apixaban  5 mg Oral BID  . aspirin EC  81 mg Oral Daily  . DULoxetine  30 mg Oral Daily  . feeding supplement (ENSURE ENLIVE)  237 mL Oral BID BM   . furosemide  40 mg Intravenous Daily  . gabapentin  300 mg Oral TID  . guaiFENesin  600 mg Oral BID  . insulin aspart  0-15 Units Subcutaneous TID PC & HS  . insulin glargine  80 Units Subcutaneous Daily  . multivitamin with minerals  1 tablet Oral Daily  . pantoprazole  40 mg Oral Daily  . polyethylene glycol  17 g Oral Daily  . rosuvastatin  20 mg Oral Daily  . senna-docusate  1 tablet Oral BID   Continuous Infusions:   LOS: 4 days   Nuriyah Hanline  [facility]     08/15/2019, 2:37 PM

## 2019-08-16 ENCOUNTER — Inpatient Hospital Stay: Payer: Medicare Other

## 2019-08-16 DIAGNOSIS — G9341 Metabolic encephalopathy: Secondary | ICD-10-CM

## 2019-08-16 DIAGNOSIS — R4182 Altered mental status, unspecified: Secondary | ICD-10-CM

## 2019-08-16 LAB — BASIC METABOLIC PANEL
Anion gap: 8 (ref 5–15)
BUN: 28 mg/dL — ABNORMAL HIGH (ref 8–23)
CO2: 32 mmol/L (ref 22–32)
Calcium: 8.8 mg/dL — ABNORMAL LOW (ref 8.9–10.3)
Chloride: 95 mmol/L — ABNORMAL LOW (ref 98–111)
Creatinine, Ser: 0.72 mg/dL (ref 0.61–1.24)
GFR calc Af Amer: >60 mL/min (ref >60)
GFR calc non Af Amer: >60 mL/min (ref >60)
Glucose, Bld: 176 mg/dL — ABNORMAL HIGH (ref 70–99)
Potassium: 4.1 mmol/L (ref 3.5–5.1)
Sodium: 135 mmol/L (ref 135–145)

## 2019-08-16 LAB — CBC
HCT: 38.7 % — ABNORMAL LOW (ref 39.0–52.0)
Hemoglobin: 12.2 g/dL — ABNORMAL LOW (ref 13.0–17.0)
MCH: 28 pg (ref 26.0–34.0)
MCHC: 31.5 g/dL (ref 30.0–36.0)
MCV: 88.8 fL (ref 80.0–100.0)
Platelets: 234 10*3/uL (ref 150–400)
RBC: 4.36 MIL/uL (ref 4.22–5.81)
RDW: 15.5 % (ref 11.5–15.5)
WBC: 8.5 10*3/uL (ref 4.0–10.5)
nRBC: 0 % (ref 0.0–0.2)

## 2019-08-16 LAB — GLUCOSE, CAPILLARY
Glucose-Capillary: 160 mg/dL — ABNORMAL HIGH (ref 70–99)
Glucose-Capillary: 219 mg/dL — ABNORMAL HIGH (ref 70–99)
Glucose-Capillary: 212 mg/dL — ABNORMAL HIGH (ref 70–99)
Glucose-Capillary: 153 mg/dL — ABNORMAL HIGH (ref 70–99)

## 2019-08-16 LAB — CULTURE, BLOOD (ROUTINE X 2)
Culture: NO GROWTH
Culture: NO GROWTH
Special Requests: ADEQUATE
Special Requests: ADEQUATE

## 2019-08-16 LAB — MAGNESIUM: Magnesium: 2.4 mg/dL (ref 1.7–2.4)

## 2019-08-16 LAB — TSH: TSH: 1.062 u[IU]/mL (ref 0.350–4.500)

## 2019-08-16 LAB — VITAMIN B12: Vitamin B-12: 206 pg/mL (ref 180–914)

## 2019-08-16 LAB — SEDIMENTATION RATE: Sed Rate: 77 mm/hr — ABNORMAL HIGH (ref 0–20)

## 2019-08-16 MED ORDER — SIMETHICONE 80 MG PO CHEW
80.0000 mg | CHEWABLE_TABLET | Freq: Four times a day (QID) | ORAL | Status: DC | PRN
  Administered 2019-08-16: 80 mg via ORAL
  Filled 2019-08-16 (×4): qty 1

## 2019-08-16 MED ORDER — FUROSEMIDE 40 MG PO TABS
40.0000 mg | ORAL_TABLET | Freq: Every day | ORAL | Status: DC
  Administered 2019-08-17: 40 mg via ORAL
  Filled 2019-08-16: qty 1

## 2019-08-16 NOTE — Procedures (Signed)
ELECTROENCEPHALOGRAM REPORT   Patient: Nathan Dean       Room #: 241A-AA EEG No. ID: [id number] Age: 64 y.o.        Sex: male Requesting Physician: Myriam Forehand Report Date:  08/16/2019        Interpreting Physician: Thana Farr  History: [name] is an 64 y.o. male with syncope and altered mental status  Medications:  Eliquis, ASA, Coreg, Cymbalta, Lasix, Neurontin, Insulin, Crestor  Conditions of Recording:  This is a 21 channel routine scalp EEG performed with bipolar and monopolar montages arranged in accordance to the international 10/20 system of electrode placement. One channel was dedicated to EKG recording.  The patient is in the awake state.  Description:  The background activity is slow and poorly organized.  It consists of low voltage activity in the delta theta continuum.  This activity is diffusely distributed and continuous.   No epileptiform activity is noted.  Hyperventilation was not performed.  Intermittent photic stimulation was performed but failed to illicit any change in the tracing.   IMPRESSION: This is an abnormal EEG secondary to general background slowing.  This finding may be seen with a diffuse disturbance that is etiologically nonspecific, but may include a metabolic encephalopathy, among other possibilities.  No epileptiform activity is noted.     Thana Farr, MD Neurology [phone] 08/16/2019, 6:14 PM

## 2019-08-16 NOTE — Progress Notes (Signed)
Progress Note    Nathan MottGregory Jakubiak  ZOX:[id number] DOB: 09/09/1955  DOA: 08/11/2019 PCP: Ethelda ChickSmith, Kristi M, MD      Brief Narrative:    Medical records reviewed and are as summarized below:  Nathan Dean is an 64 y.o. male with past medical history of HTN, HLD, type II DM, OSA noncompliant with CPAP. Presented with complaints of near syncope while having a bowel movement. Recent hospitalization for rotator cuff tear repair requiring SNF, discharged on 08/09/2019.      Assessment/Plan:   Active Problems:   Acute cor pulmonale (HCC)   Acute metabolic encephalopathy   Acute pulmonary edema (HCC)   Edema   1. Acute on chronic hypoxic respiratory failure Acute pulmonary edema/acute on chronic HFpEF Hypertensive urgency OSA, noncompliant with CPAP  Evidence of pneumonia, PE or acute MI Change IV to p.o. Lasix Continue Coreg. Echocardiogramshowed EF of 60 to 65%, grade 1 diastolic dysfunction Continue to monitor on telemetry. Cardiology currently signed off.  2. Near syncope Constipation Near syncope in the setting of attempting bowel movement. Probable vasovagal syncope Continue bowel regimen.  3. OSA compliant with CPAP. Continue CPAP at night  4. Type 2 diabetes mellitus, uncontrolled with hyperglycemia with peripheral neuropathy Continue sliding scale insulin. Continue Neurontin.  5. Hyperlipidemia Continue statin  6. Recent shoulder surgery Continue oxycodone per home regimen. As needed morphine for severe pain  7. GERD. Continue PPI.  8.Lower extremity Doppler is positive for distal DVT. Continue Eliquis  9.  Intermittent confusion: No evidence of acute stroke on CT head or MRI brain.  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  MRI also showed possible remote history of stroke.  Patient is on low-dose aspirin.  Follow-up with neurologist.  His wife said patient has been confused intermittently for the past 2  months and she also mentioned that patient has had recurrent falls at home.  Body mass index is 36.67 kg/m.  (Morbid obesity)   Family Communication/Anticipated D/C date and plan/Code Status   DVT prophylaxis: Eliquis Code Status: Full code Family Communication: Plan discussed with patient's wife at the bedside Disposition Plan: Patient is from SNF.  Plan to discharge to SNF tomorrow.      Subjective:   Patient has no complaints.  He feels better today.  His wife said that patient did not get CPAP machine until the early hours of the morning and so he did not sleep well.  Objective:    Vitals:   08/15/19 2345 08/16/19 0332 08/16/19 0814 08/16/19 1200  BP:  116/74 (!) 152/75 137/76  Pulse: 94 82 93 93  Resp:  18 20 19   Temp:  98.3 F (36.8 C) 98.3 F (36.8 C) 98.4 F (36.9 C)  TempSrc:  Oral Oral Oral  SpO2:  95% 90% 93%  Weight:  (!) 155.2 kg    Height:        Intake/Output Summary (Last 24 hours) at 08/16/2019 1419 Last data filed at 08/16/2019 0700 Gross per 24 hour  Intake 240 ml  Output 950 ml  Net -710 ml   Filed Weights   08/14/19 0544 08/15/19 0900 08/16/19 0332  Weight: (!) 160.2 kg (!) 155.4 kg (!) 155.2 kg    Exam:  GEN: NAD SKIN: No rash. Surgical wound on right shoulder is clean, dry and intact EYES: EOMI ENT: MMM CV: RRR PULM: CTA B ABD: soft, ND, NT, +BS CNS: Alert, oriented to person, place and time.,  Nonfocal EXT: b/l leg edema improved, no  tenderness   Data Reviewed:   I have personally reviewed following labs and imaging studies:  Labs: Labs show the following:   Basic Metabolic Panel: Recent Labs  Lab 08/12/19 0510 08/12/19 0510 08/13/19 0530 08/13/19 0530 08/14/19 0006 08/14/19 0006 08/15/19 0520 08/16/19 0555  NA 139  --  136  --  136  --  136 135  K 3.9   < > 4.0   < > 3.6   < > 4.2 4.1  CL 102  --  97*  --  94*  --  96* 95*  CO2 29  --  29  --  32  --  31 32  GLUCOSE 157*  --  175*  --  165*  --  182* 176*  BUN  19  --  25*  --  30*  --  32* 28*  CREATININE 0.70  --  0.87  --  1.01  --  0.80 0.72  CALCIUM 8.5*  --  8.3*  --  8.4*  --  8.7* 8.8*  MG  --   --  2.1  --  2.3  --  2.4 2.4   < > = values in this interval not displayed.   GFR Estimated Creatinine Clearance: 157.7 mL/min (by C-G formula based on SCr of 0.72 mg/dL). Liver Function Tests: Recent Labs  Lab 08/11/19 1645  AST 22  ALT 18  ALKPHOS 68  BILITOT 0.9  PROT 6.7  ALBUMIN 3.5   No results for input(s): LIPASE, AMYLASE in the last 168 hours. No results for input(s): AMMONIA in the last 168 hours. Coagulation profile No results for input(s): INR, PROTIME in the last 168 hours.  CBC: Recent Labs  Lab 08/11/19 1645 08/12/19 0510 08/13/19 0530 08/13/19 1547 08/14/19 0006 08/15/19 0520 08/16/19 0555  WBC 11.8*   < > 9.9 10.5 8.8 8.2 8.5  NEUTROABS 9.3*  --   --   --   --   --   --   HGB 15.1   < > 13.5 14.3 13.4 12.6* 12.2*  HCT 46.8   < > 42.1 45.0 41.6 39.9 38.7*  MCV 87.3   < > 87.9 88.9 88.5 88.5 88.8  PLT 242   < > 213 259 232 235 234   < > = values in this interval not displayed.   Cardiac Enzymes: No results for input(s): CKTOTAL, CKMB, CKMBINDEX, TROPONINI in the last 168 hours. BNP (last 3 results) No results for input(s): PROBNP in the last 8760 hours. CBG: Recent Labs  Lab 08/15/19 1150 08/15/19 1636 08/15/19 2109 08/16/19 0816 08/16/19 1201  GLUCAP 255* 208* 186* 160* 219*   D-Dimer: No results for input(s): DDIMER in the last 72 hours. Hgb A1c: No results for input(s): HGBA1C in the last 72 hours. Lipid Profile: No results for input(s): CHOL, HDL, LDLCALC, TRIG, CHOLHDL, LDLDIRECT in the last 72 hours. Thyroid function studies: No results for input(s): TSH, T4TOTAL, T3FREE, THYROIDAB in the last 72 hours.  Invalid input(s): FREET3 Anemia work up: No results for input(s): VITAMINB12, FOLATE, FERRITIN, TIBC, IRON, RETICCTPCT in the last 72 hours. Sepsis Labs: Recent Labs  Lab  08/11/19 1847 08/11/19 1943 08/11/19 2202 08/12/19 0510 08/13/19 1547 08/14/19 0006 08/15/19 0520 08/16/19 0555  PROCALCITON <0.10  --   --   --   --   --   --   --   WBC  --   --   --    < > 10.5 8.8 8.2 8.5  LATICACIDVEN  --  2.0* 1.6  --   --   --   --   --    < > = values in this interval not displayed.    Microbiology Recent Results (from the past 240 hour(s))  SARS CORONAVIRUS 2 (TAT 6-24 HRS) Nasopharyngeal Nasopharyngeal Swab     Status: None   Collection Time: 08/09/19 11:36 AM   Specimen: Nasopharyngeal Swab  Result Value Ref Range Status   SARS Coronavirus 2 NEGATIVE NEGATIVE Final    Comment: (NOTE) SARS-CoV-2 target nucleic acids are NOT DETECTED. The SARS-CoV-2 RNA is generally detectable in upper and lower respiratory specimens during the acute phase of infection. Negative results do not preclude SARS-CoV-2 infection, do not rule out co-infections with other pathogens, and should not be used as the sole basis for treatment or other patient management decisions. Negative results must be combined with clinical observations, patient history, and epidemiological information. The expected result is Negative. Fact Sheet for Patients: HairSlick.no Fact Sheet for Healthcare Providers: quierodirigir.com This test is not yet approved or cleared by the [place] FDA and  has been authorized for detection and/or diagnosis of SARS-CoV-2 by FDA under an Emergency Use Authorization (EUA). This EUA will remain  in effect (meaning this test can be used) for the duration of the COVID-19 declaration under Section 56 4(b)(1) of the Act, 21 U.S.C. section 360bbb-3(b)(1), unless the authorization is terminated or revoked sooner. Performed at [facility], [address] [place], [place] [address]   Blood Culture (routine x 2)     Status: None   Collection Time: 08/11/19  7:43 PM   Specimen: BLOOD  Result Value  Ref Range Status   Specimen Description BLOOD BLOOD LEFT FOREARM  Final   Special Requests   Final    BOTTLES DRAWN AEROBIC AND ANAEROBIC Blood Culture adequate volume   Culture   Final    NO GROWTH 5 DAYS Performed at [facility], [address], [place], [place] [address]    Report Status 08/16/2019 FINAL  Final  Blood Culture (routine x 2)     Status: None   Collection Time: 08/11/19  7:43 PM   Specimen: BLOOD  Result Value Ref Range Status   Specimen Description BLOOD LEFT ANTECUBITAL  Final   Special Requests   Final    BOTTLES DRAWN AEROBIC AND ANAEROBIC Blood Culture adequate volume   Culture   Final    NO GROWTH 5 DAYS Performed at [facility], [address] [place], [place] [address]    Report Status 08/16/2019 FINAL  Final  Respiratory Panel by RT PCR (Flu A&B, Covid) - Nasopharyngeal Swab     Status: None   Collection Time: 08/11/19  7:43 PM   Specimen: Nasopharyngeal Swab  Result Value Ref Range Status   SARS Coronavirus 2 by RT PCR NEGATIVE NEGATIVE Final    Comment: (NOTE) SARS-CoV-2 target nucleic acids are NOT DETECTED. The SARS-CoV-2 RNA is generally detectable in upper respiratoy specimens during the acute phase of infection. The lowest concentration of SARS-CoV-2 viral copies this assay can detect is 131 copies/mL. A negative result does not preclude SARS-Cov-2 infection and should not be used as the sole basis for treatment or other patient management decisions. A negative result may occur with  improper specimen collection/handling, submission of specimen other than nasopharyngeal swab, presence of viral mutation(s) within the areas targeted by this assay, and inadequate number of viral copies (<131 copies/mL). A negative result must  be combined with clinical observations, patient history, and epidemiological information. The expected result is Negative. Fact Sheet for Patients:  https://www.moore.com/ Fact  Sheet for Healthcare Providers:  https://www.young.biz/ This test is not yet ap proved or cleared by the [place] FDA and  has been authorized for detection and/or diagnosis of SARS-CoV-2 by FDA under an Emergency Use Authorization (EUA). This EUA will remain  in effect (meaning this test can be used) for the duration of the COVID-19 declaration under Section 564(b)(1) of the Act, 21 U.S.C. section 360bbb-3(b)(1), unless the authorization is terminated or revoked sooner.    Influenza A by PCR NEGATIVE NEGATIVE Final   Influenza B by PCR NEGATIVE NEGATIVE Final    Comment: (NOTE) The Xpert Xpress SARS-CoV-2/FLU/RSV assay is intended as an aid in  the diagnosis of influenza from Nasopharyngeal swab specimens and  should not be used as a sole basis for treatment. Nasal washings and  aspirates are unacceptable for Xpert Xpress SARS-CoV-2/FLU/RSV  testing. Fact Sheet for Patients: https://www.moore.com/ Fact Sheet for Healthcare Providers: https://www.young.biz/ This test is not yet approved or cleared by the [place] FDA and  has been authorized for detection and/or diagnosis of SARS-CoV-2 by  FDA under an Emergency Use Authorization (EUA). This EUA will remain  in effect (meaning this test can be used) for the duration of the  Covid-19 declaration under Section 564(b)(1) of the Act, 21  U.S.C. section 360bbb-3(b)(1), unless the authorization is  terminated or revoked. Performed at [facility], [address] [place], [place] [address]     Procedures and diagnostic studies:  CT HEAD WO CONTRAST  Result Date: 08/15/2019 CLINICAL DATA:  Near syncope during a bowel movement 4 days ago. EXAM: CT HEAD WITHOUT CONTRAST TECHNIQUE: Contiguous axial images were obtained from the base of the skull through the vertex without intravenous contrast. COMPARISON:  None. FINDINGS: Brain: Moderate, diffuse dilatation of  ventricular system is compatible with communicating hydrocephalus. No evidence of hemorrhage, infarct, mass lesion, midline shift or abnormal extra-axial fluid collection is identified. No pneumocephalus. Vascular: No hyperdense vessel or unexpected calcification. Skull: Intact.  No focal lesion. Sinuses/Orbits: Negative. Other: None. IMPRESSION: Findings compatible with communicating hydrocephalus. The exam is otherwise negative. Electronically Signed   By: Drusilla Kanner M.D.   On: 08/15/2019 14:58   MR BRAIN WO CONTRAST  Result Date: 08/16/2019 CLINICAL DATA:  Delirium, abnormal CT EXAM: MRI HEAD WITHOUT CONTRAST TECHNIQUE: Multiplanar, multiecho pulse sequences of the brain and surrounding structures were obtained without intravenous contrast. COMPARISON:  Correlation made with prior CT and cervical spine MRI from 2020 FINDINGS: Motion artifact is present. Brain: There is no acute infarction or intracranial hemorrhage. There is enlargement of the ventricular system, particularly the lateral ventricles. There may been a similar degree of ventricular prominence on the 2020 cervical spine MRI, which minimally includes the posterior left lateral ventricle. Patchy and confluent areas of T2 hyperintensity in the supratentorial white matter are nonspecific but may reflect mild to moderate chronic microvascular ischemic changes. There may be a component of hydrocephalus related mild interstitial edema. Possible chronic left frontal infarct with associated susceptibility reflecting chronic blood products. There is no intracranial mass or mass effect. There is no extra-axial fluid collection. Vascular: Major vessel flow voids at the skull base are preserved. Skull and upper cervical spine: Normal marrow signal is preserved. Sinuses/Orbits: Paranasal sinuses are aerated. Orbits are unremarkable. Other: Sella is unremarkable.  Mastoid air cells are clear. IMPRESSION: Partially motion degraded. No acute infarction,  hemorrhage, or mass. Communicating hydrocephalus. A similar degree hydrocephalus may have been present on a 2020 cervical spine MRI. Mild to moderate chronic microvascular ischemic changes. 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 Electronically Signed   By: Guadlupe Spanish M.D.   On: 08/16/2019 10:57    Medications:   . apixaban  10 mg Oral BID   Followed by  . [START ON 08/21/2019] apixaban  5 mg Oral BID  . aspirin EC  81 mg Oral Daily  . carvedilol  6.25 mg Oral BID WC  . DULoxetine  30 mg Oral Daily  . feeding supplement (ENSURE ENLIVE)  237 mL Oral BID BM  . [START ON 08/17/2019] furosemide  40 mg Oral Daily  . gabapentin  300 mg Oral TID  . guaiFENesin  600 mg Oral BID  . insulin aspart  0-15 Units Subcutaneous TID PC & HS  . insulin glargine  80 Units Subcutaneous Daily  . multivitamin with minerals  1 tablet Oral Daily  . pantoprazole  40 mg Oral Daily  . polyethylene glycol  17 g Oral Daily  . rosuvastatin  20 mg Oral Daily  . senna-docusate  1 tablet Oral BID   Continuous Infusions:   LOS: 5 days   Krystan Northrop  [facility]     08/16/2019, 2:19 PM

## 2019-08-16 NOTE — Progress Notes (Signed)
Called to patient room to evaluate Home CPAP. Patient states that his personal home cpap unit was not working properly and cutting off intermittently. Offered the patient our unit with nasal mask. Patient was agreeable. Placed patient on Auto CPAP unit. Tolerating well.

## 2019-08-16 NOTE — Progress Notes (Signed)
Report received from [facility] and care assumed.  Pt lying in bed awake and was pleasant and coopertive when I went in to assess hime.  Agree w/ 7p-7a assessment done by Marcelino Duster, RN.  Pt denies pain or sob, denies any needs at this time.  SR 90's.

## 2019-08-16 NOTE — Progress Notes (Signed)
Physical Therapy Treatment Patient Details Name: Nathan Dean MRN: [id number] DOB: 05/31/1955 Today's Date: 08/16/2019    History of Present Illness Per MD notes: Pt is 64 yo male who presented to the emergency room with acute onset of presyncope while having a bowel movement.  MD assessment includes acute on chronic hypoxic respiratory failure, acute pulmonary edema/acute on chronic HFpEF, hypertensive urgency, near syncope, and RLE DVT.  Of note pt with recent R shoulder arthroscopy 08/07/19 with subacromial decompression and distal clavicle excision, biceps tenotomy, and rotator cuff repair.  PMH also includes OSA, HTN, GERD, DM, and TKA.    PT Comments    Pt pleasant and motivated to participate during the session. Pt had less confusion today compared to the previous session and was able to follow all one-step commands t/o the session. Initially when sitting up EOB, pt immediately returned to supine because he felt "lightheaded". In supine, BP was 133/67 and in EOB sitting, BP was 129/80. After education on importance of sitting up at EOB, pt was able to sit EOB for around 10 minutes with no change in symptoms (slightly lightheaded the whole time). When EOB, pt demonstrated improved ability to scoot laterally compared to past sessions.    Follow Up Recommendations  SNF     Equipment Recommendations  3in1 (PT)(bariatric)    Recommendations for Other Services       Precautions / Restrictions Precautions Precautions: Fall;Shoulder Shoulder Interventions: Shoulder sling/immobilizer;Shoulder abduction pillow Precaution Booklet Issued: No Precaution Comments: Per Dr. Mack Guise: "He will leave the sling off while resting in bed.   Patient will wear the sling when up with PT." Required Braces or Orthoses: Sling Restrictions Weight Bearing Restrictions: Yes RUE Weight Bearing: Non weight bearing Other Position/Activity Restrictions: From recent admission: Per MD, prefers pt to be NWB to  RUE, but would permit a "light lean" of R elbow on platform walker    Mobility  Bed Mobility Overal bed mobility: Needs Assistance Bed Mobility: Sit to Supine;Supine to Sit     Supine to sit: HOB elevated;+2 for physical assistance;+2 for safety/equipment;Mod assist Sit to supine: +2 for physical assistance;+2 for safety/equipment;Mod assist   General bed mobility comments: Pt required physical assistance for his trunk and his LE's  Transfers                 General transfer comment: Unable to perform transfers this date.  Ambulation/Gait                 Stairs             Wheelchair Mobility    Modified Rankin (Stroke Patients Only)       Balance Overall balance assessment: Needs assistance Sitting-balance support: Feet supported Sitting balance-Leahy Scale: Fair Sitting balance - Comments: Pt required occasional min assist to maintain upright sitting posture                                    Cognition Arousal/Alertness: Awake/alert Behavior During Therapy: WFL for tasks assessed/performed                                   General Comments: Pt stated that felt "a little bit out of it." Pt consistently followed all commands t/o session      Exercises Total Joint Exercises Ankle Circles/Pumps: AROM;Strengthening;Both;10 reps Quad Sets: Strengthening;Both;10  reps Straight Leg Raises: AAROM;Both;10 reps Long Arc Quad: AROM;Strengthening;Both;10 reps Other Exercises Other Exercises: Lateral scooting EOB ~ 1.5 feet    General Comments        Pertinent Vitals/Pain Pain Descriptors / Indicators: Aching;Sore;Grimacing;Moaning Pain Intervention(s): Monitored during session    Home Living                      Prior Function            PT Goals (current goals can now be found in the care plan section) Progress towards PT goals: Progressing toward goals    Frequency    7X/week      PT Plan  Current plan remains appropriate    Co-evaluation PT/OT/SLP Co-Evaluation/Treatment: Yes Reason for Co-Treatment: For patient/therapist safety;To address functional/ADL transfers PT goals addressed during session: Strengthening/ROM OT goals addressed during session: ADL's and self-care;Proper use of Adaptive equipment and DME      AM-PAC PT "6 Clicks" Mobility   Outcome Measure  Help needed turning from your back to your side while in a flat bed without using bedrails?: A Little Help needed moving from lying on your back to sitting on the side of a flat bed without using bedrails?: A Lot Help needed moving to and from a bed to a chair (including a wheelchair)?: Total Help needed standing up from a chair using your arms (e.g., wheelchair or bedside chair)?: Total Help needed to walk in hospital room?: Total Help needed climbing 3-5 steps with a railing? : Total 6 Click Score: 9    End of Session Equipment Utilized During Treatment: Gait belt Activity Tolerance: Patient tolerated treatment well Patient left: in bed;with call bell/phone within reach;with bed alarm set Nurse Communication: Mobility status;Precautions;Weight bearing status PT Visit Diagnosis: Other abnormalities of gait and mobility (R26.89);Muscle weakness (generalized) (M62.81);Pain Pain - Right/Left: Right Pain - part of body: Shoulder     Time: 9629-5284 PT Time Calculation (min) (ACUTE ONLY): 38 min  Charges:                        Veleta Miners, SPT 08/16/19 2:19 PM

## 2019-08-16 NOTE — Progress Notes (Signed)
Subjective:  Patient seen this evening in his hospital bed.  His wife is at the bedside.  Patient was seen by the neurologist today.  An MRI of the brain was performed along with an EEG.    Objective:   VITALS:   Vitals:   08/16/19 0332 08/16/19 0814 08/16/19 1200 08/16/19 1744  BP: 116/74 (!) 152/75 137/76 (!) 152/82  Pulse: 82 93 93 100  Resp: 18 20 19 18   Temp: 98.3 F (36.8 C) 98.3 F (36.8 C) 98.4 F (36.9 C) 98.8 F (37.1 C)  TempSrc: Oral Oral Oral   SpO2: 95% 90% 93% 92%  Weight: (!) 155.2 kg     Height:        PHYSICAL EXAM: Right upper extremity: Polar Care in place. Neurovascular intact Sensation intact distally Intact pulses distally Incision: no drainage No cellulitis present Compartment soft  LABS  Results for orders placed or performed during the hospital encounter of 08/11/19 (from the past 24 hour(s))  Glucose, capillary     Status: Abnormal   Collection Time: 08/15/19  9:09 PM  Result Value Ref Range   Glucose-Capillary 186 (H) 70 - 99 mg/dL  Basic metabolic panel     Status: Abnormal   Collection Time: 08/16/19  5:55 AM  Result Value Ref Range   Sodium 135 135 - 145 mmol/L   Potassium 4.1 3.5 - 5.1 mmol/L   Chloride 95 (L) 98 - 111 mmol/L   CO2 32 22 - 32 mmol/L   Glucose, Bld 176 (H) 70 - 99 mg/dL   BUN 28 (H) 8 - 23 mg/dL   Creatinine, Ser 10/16/19 0.61 - 1.24 mg/dL   Calcium 8.8 (L) 8.9 - 10.3 mg/dL   GFR calc non Af Amer >60 >60 mL/min   GFR calc Af Amer >60 >60 mL/min   Anion gap 8 5 - 15  CBC     Status: Abnormal   Collection Time: 08/16/19  5:55 AM  Result Value Ref Range   WBC 8.5 4.0 - 10.5 K/uL   RBC 4.36 4.22 - 5.81 MIL/uL   Hemoglobin 12.2 (L) 13.0 - 17.0 g/dL   HCT 10/16/19 (L) 11.9 - 14.7 %   MCV 88.8 80.0 - 100.0 fL   MCH 28.0 26.0 - 34.0 pg   MCHC 31.5 30.0 - 36.0 g/dL   RDW 82.9 56.2 - 13.0 %   Platelets 234 150 - 400 K/uL   nRBC 0.0 0.0 - 0.2 %  Magnesium     Status: None   Collection Time: 08/16/19  5:55 AM  Result  Value Ref Range   Magnesium 2.4 1.7 - 2.4 mg/dL  Glucose, capillary     Status: Abnormal   Collection Time: 08/16/19  8:16 AM  Result Value Ref Range   Glucose-Capillary 160 (H) 70 - 99 mg/dL  Glucose, capillary     Status: Abnormal   Collection Time: 08/16/19 12:01 PM  Result Value Ref Range   Glucose-Capillary 219 (H) 70 - 99 mg/dL  TSH     Status: None   Collection Time: 08/16/19  3:58 PM  Result Value Ref Range   TSH 1.062 0.350 - 4.500 uIU/mL  Sedimentation rate     Status: Abnormal   Collection Time: 08/16/19  3:58 PM  Result Value Ref Range   Sed Rate 77 (H) 0 - 20 mm/hr  Glucose, capillary     Status: Abnormal   Collection Time: 08/16/19  5:41 PM  Result Value Ref Range   Glucose-Capillary  212 (H) 70 - 99 mg/dL    EEG  Result Date: 10/18/4032 Thana Farr, MD     08/16/2019  6:19 PM ELECTROENCEPHALOGRAM REPORT Patient: Nathan Dean       Room #: 241A-AA EEG No. ID: [id number] Age: 64 y.o.        Sex: male Requesting Physician: Myriam Forehand Report Date:  08/16/2019       Interpreting Physician: Thana Farr History: Read Bonelli is an 64 y.o. male with syncope and altered mental status Medications: Eliquis, ASA, Coreg, Cymbalta, Lasix, Neurontin, Insulin, Crestor Conditions of Recording:  This is a 21 channel routine scalp EEG performed with bipolar and monopolar montages arranged in accordance to the international 10/20 system of electrode placement. One channel was dedicated to EKG recording. The patient is in the awake state. Description:  The background activity is slow and poorly organized.  It consists of low voltage activity in the delta theta continuum.  This activity is diffusely distributed and continuous.  No epileptiform activity is noted. Hyperventilation was not performed.  Intermittent photic stimulation was performed but failed to illicit any change in the tracing. IMPRESSION: This is an abnormal EEG secondary to general background slowing.  This finding may be seen  with a diffuse disturbance that is etiologically nonspecific, but may include a metabolic encephalopathy, among other possibilities.  No epileptiform activity is noted.  Thana Farr, MD Neurology [phone] 08/16/2019, 6:14 PM   CT HEAD WO CONTRAST  Result Date: 08/15/2019 CLINICAL DATA:  Near syncope during a bowel movement 4 days ago. EXAM: CT HEAD WITHOUT CONTRAST TECHNIQUE: Contiguous axial images were obtained from the base of the skull through the vertex without intravenous contrast. COMPARISON:  None. 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 No evidence of hemorrhage, infarct, mass lesion, midline shift or abnormal extra-axial fluid collection is identified. No pneumocephalus. Vascular: No hyperdense vessel or unexpected calcification. Skull: Intact.  No focal lesion. Sinuses/Orbits: Negative. Other: None. IMPRESSION: Findings compatible with communicating hydrocephalus. The exam is otherwise negative. Electronically Signed   By: Drusilla Kanner M.D.   On: 08/15/2019 14:58   MR BRAIN WO CONTRAST  Result Date: 08/16/2019 CLINICAL DATA:  Delirium, abnormal CT EXAM: MRI HEAD WITHOUT CONTRAST TECHNIQUE: Multiplanar, multiecho pulse sequences of the brain and surrounding structures were obtained without intravenous contrast. COMPARISON:  Correlation made with prior CT and cervical spine MRI from 2020 FINDINGS: Motion artifact is present. Brain: There is no acute infarction or intracranial hemorrhage. There is enlargement of the ventricular system, particularly the lateral ventricles. There may been a similar degree of ventricular prominence on the 2020 cervical spine MRI, which minimally includes the posterior left lateral ventricle. Patchy and confluent areas of T2 hyperintensity in the supratentorial white matter are nonspecific but may reflect mild to moderate chronic microvascular ischemic changes. There may be a component  of hydrocephalus related mild interstitial edema. Possible chronic left frontal infarct with associated susceptibility reflecting chronic blood products. There is no intracranial mass or mass effect. There is no extra-axial fluid collection. Vascular: Major vessel flow voids at the skull base are preserved. Skull and upper cervical spine: Normal marrow signal is preserved. Sinuses/Orbits: Paranasal sinuses are aerated. Orbits are unremarkable. Other: Sella is unremarkable.  Mastoid air cells are clear. IMPRESSION: Partially motion degraded. No acute infarction, hemorrhage, or mass. Communicating hydrocephalus. A similar degree hydrocephalus may have been present on a 2020 cervical spine MRI. Mild to moderate chronic microvascular ischemic changes. Possible chronic left frontal  infarct. Electronically Signed   By: Macy Mis M.D.   On: 08/16/2019 10:57    Assessment/Plan:     Active Problems:   Acute cor pulmonale (HCC)   Acute metabolic encephalopathy   Acute pulmonary edema (HCC)   Edema  I reviewed the head CT and MRI of the brain.  I also have reviewed the neurologist note along with the EEG findings.  I have contacted Dr. Meade Maw for routine consultation on the patient's hydrocephalus.  The hydrocephalus may be a contributing factor to the patient's mental status changes and issues with balance and gait.  Dr. Izora Ribas will see the patient tomorrow.    Thornton Park , MD 08/16/2019, 7:31 PM

## 2019-08-16 NOTE — Plan of Care (Signed)
  Problem: Education: Goal: Knowledge of General Education information will improve Description: Including pain rating scale, medication(s)/side effects and non-pharmacologic comfort measures Outcome: Progressing   Problem: Pain Managment: Goal: General experience of comfort will improve Outcome: Progressing   Problem: Safety: Goal: Ability to remain free from injury will improve Outcome: Progressing   

## 2019-08-16 NOTE — Progress Notes (Signed)
eeg completed ° °

## 2019-08-16 NOTE — Progress Notes (Signed)
Ice pack applied to right shoulder surgical site.

## 2019-08-16 NOTE — Progress Notes (Signed)
Occupational Therapy Treatment Patient Details Name: Nathan Dean MRN: [id number] DOB: 27-Nov-1955 Today's Date: 08/16/2019    History of present illness Per MD notes: Pt is 64 yo male who presented to the emergency room with acute onset of presyncope while having a bowel movement.  MD assessment includes acute on chronic hypoxic respiratory failure, acute pulmonary edema/acute on chronic HFpEF, hypertensive urgency, near syncope, and RLE DVT.  Of note pt with recent R shoulder arthroscopy 08/07/19 with subacromial decompression and distal clavicle excision, biceps tenotomy, and rotator cuff repair.  PMH also includes OSA, HTN, GERD, DM, and TKA.   OT comments  Co-tx with PT this date for optimal patient safety and participation with OT focusing on ADL retraining and management of R UE during functional tasks.  Patient supine in bed and agreeable to therapy.  Patient endorses pain in R shoulder and states he is feeling "off" after scans this AM.  Initially BP at 133/67.  Patient able to move to EOB to R side with MOD A x 3, required 1 person to fully manage R UE.  Patient initially immediately went back to supine stating he was feeling very dizzy.  Patient able to come back to EOB with MOD A x 2.  BP at 129/80.  Patient felt encouraged BP did not drop significantly with sitting up.  Tolerated static sitting for ~5 minutes with CGA-MIN A.  Performed lateral scooting at EOB to address trunk/core strengthening needed to improve ADL completion and functional transfers.  Required MOD/MAX A x 2-3 to move back to supine in bed.  Educated patient on positioning techniques for R UE at bed level to ensure comfort and protection.  Patient verbalized understanding.  Patient would benefit from continued occupational therapy services to address deficits in performance components outlined in evaluation.  Based on today's performance, follow up recommendation remains appropriate.   Follow Up Recommendations   SNF;Supervision - Intermittent    Equipment Recommendations  Other (comment)(defer to next level of care)    Recommendations for Other Services      Precautions / Restrictions Precautions Precautions: Fall;Shoulder Shoulder Interventions: Shoulder sling/immobilizer;Shoulder abduction pillow Precaution Booklet Issued: No Precaution Comments: Per Dr. Mack Guise: "He will leave the sling off while resting in bed.   Patient will wear the sling when up with PT." Required Braces or Orthoses: Sling Restrictions Weight Bearing Restrictions: Yes RUE Weight Bearing: Non weight bearing Other Position/Activity Restrictions: From recent admission: Per MD, prefers pt to be NWB to RUE, but would permit a "light lean" of R elbow on platform walker       Mobility Bed Mobility Overal bed mobility: Needs Assistance Bed Mobility: Sit to Supine;Supine to Sit Rolling: (x2-3)   Supine to sit: HOB elevated;+2 for physical assistance;+2 for safety/equipment;Mod assist(MOD A x 2-3) Sit to supine: +2 for physical assistance;+2 for safety/equipment;Mod assist(MOD A x 2-3)   General bed mobility comments: Required education on body mechanics and sequencing to perform bed mobility while maintaining R shoulder precautions.  Requires one person to manage R UE.  Transfers                 General transfer comment: Unable to tolerate standing activities.    Balance Overall balance assessment: Needs assistance Sitting-balance support: Feet supported;Single extremity supported Sitting balance-Leahy Scale: Fair Sitting balance - Comments: CGA-MIN A to maintain upright sitting.  ADL either performed or assessed with clinical judgement   ADL Overall ADL's : Needs assistance/impaired                                       General ADL Comments: Limited ability to participate secondary to dizziness.  Performs most tasks at bed level.      Vision Patient Visual Report: No change from baseline     Perception     Praxis      Cognition Arousal/Alertness: Awake/alert Behavior During Therapy: WFL for tasks assessed/performed Overall Cognitive Status: Impaired/Different from baseline                                 General Comments: Patient states he still feels "out of it".  Recently found with hydrocephalus.  Difficulty with word finding.        Exercises  Other Exercises Other Exercises: Co-tx with PT for optimal safety and performance in therapeutic tasks this date.  Performed bed mobility with MOD A x 3.  Required use of one person for management of R UE due to shoulder precautions.  Patient tolerated sitting at EOB for ~5-10 minutes with CGA-MIN A.  Unable to tolerate sling at this time due to dizziness.  Performed lateral scooting to address trunk/core strengthening needed to improve safety during ADLs and functional transfers. Other Exercises: Provided education to patient on positioning of R UE while supine in bed   Shoulder Instructions       General Comments Patient did not demonstrate orthostatic hypotension this date but endorses complaints of dizziness when sitting upright.    Pertinent Vitals/ Pain       Pain Assessment: Faces Faces Pain Scale: Hurts little more Pain Location: Patient unable to rate pain but noticed grimacing with movement.   Pt able to identify R shoulder. Pain Descriptors / Indicators: Grimacing;Moaning Pain Intervention(s): Limited activity within patient's tolerance;Monitored during session;Repositioned  Home Living                                          Prior Functioning/Environment              Frequency  Min 2X/week        Progress Toward Goals  OT Goals(current goals can now be found in the care plan section)  Progress towards OT goals: Progressing toward goals     Plan      Co-evaluation      Reason for  Co-Treatment: For patient/therapist safety;To address functional/ADL transfers PT goals addressed during session: Strengthening/ROM OT goals addressed during session: ADL's and self-care;Proper use of Adaptive equipment and DME      AM-PAC OT "6 Clicks" Daily Activity     Outcome Measure   Help from another person eating meals?: A Little Help from another person taking care of personal grooming?: A Little Help from another person toileting, which includes using toliet, bedpan, or urinal?: A Lot(only able to utilize bedpan) Help from another person bathing (including washing, rinsing, drying)?: A Lot Help from another person to put on and taking off regular upper body clothing?: A Lot Help from another person to put on and taking off regular lower body clothing?: A Lot 6 Click Score: 14  End of Session    OT Visit Diagnosis: Unsteadiness on feet (R26.81);Other abnormalities of gait and mobility (R26.89);Muscle weakness (generalized) (M62.81);History of falling (Z91.81)   Activity Tolerance Patient tolerated treatment well;Other (comment)(Somewhat limited due to poor activity tolerance and dizziness)   Patient Left in bed;with call bell/phone within reach;with bed alarm set   Nurse Communication Other (comment)(Patient requiring assistance for clean up after BM)        Time: 8466-5993 OT Time Calculation (min): 38 min  Charges: OT General Charges $OT Visit: 1 Visit OT Treatments $Self Care/Home Management : 23-37 mins  Louanne Belton, MS, OTR/L 08/16/19, 2:39 PM

## 2019-08-16 NOTE — Consult Note (Addendum)
Reason for Consult:AMS and abnormal head CT Requesting Physician: Mal Misty  CC: Syncope  I have been asked by Dr. Mal Misty to see this patient in consultation for abnormal head CT.  HPI: Nathan Dean is an 64 y.o. male with a known history of hypertension, dyslipidemia, type 2 diabetes mellitus, obstructive sleep apnea with noncompliance to CPAP, who presented to the emergency room with acute onset of presyncope while having a bowel movement.  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  Although wife not available at time of consult, per chart patient has had some fluctuating mental status for the past 2 months.  Patient does concur that he feels fuzzy and has difficulty putting things together.  Gait issues have been longstanding and related to PN as noted by neurology in 2019.    Past Medical History:  Diagnosis Date  . 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 Diabetes ([place])   . GERD (gastroesophageal reflux disease)   . Hyperlipidemia   . Hypertension   . Obesity   . Sleep apnea     Past Surgical History:  Procedure Laterality Date  . BACK SURGERY     in the 80's -herniated disc  . BACK SURGERY     L4 bone spur 1990  . CARPAL TUNNEL RELEASE Right 12/28/2016   Procedure: RIGHT ULNAR AND MEDIAN NEUROPLASTY AT WRIST;  Surgeon: Milly Jakob, MD;  Location: [place];  Service: Orthopedics;  Laterality: Right;  . CARPAL TUNNEL RELEASE Left    25 years ago  . CATARACT EXTRACTION W/ INTRAOCULAR LENS IMPLANT Bilateral   . 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 SHOULDER OPEN ROTATOR CUFF REPAIR Right 08/07/2019   Procedure: ROTATOR CUFF REPAIR SHOULDER OPEN;  Surgeon: Thornton Park, MD;  Location: [facility];  Service: Orthopedics;  Laterality: Right;  . TONSILLECTOMY      Family History  Problem Relation Age of Onset  . Ovarian cancer Mother   . Kidney failure Father   . Cancer  Brother     Social History:  reports that he quit smoking about 23 years ago. His smoking use included cigarettes. He has a 40.00 pack-year smoking history. He has never used smokeless tobacco. He reports previous alcohol use. He reports that he does not use drugs.  Allergies  Allergen Reactions  . Penicillins Anaphylaxis, Rash and Other (See Comments)  . Succinylcholine Chloride Anaphylaxis and Rash  . Keflex [Cephalexin] Rash  . Sulfa Antibiotics Rash and Other (See Comments)    Medications:  I have reviewed the patient's current medications. Prior to Admission:  Medications Prior to Admission  Medication Sig Dispense Refill Last Dose  . aspirin 81 MG tablet Take 1 tablet (81 mg total) by mouth daily. 30 tablet  Past Week at Unknown time  . carvedilol (COREG) 6.25 MG tablet TAKE 1 TABLET BY MOUTH TWICE A DAY WITH FOOD 60 tablet 0 Past Week at Unknown time  . DULoxetine (CYMBALTA) 30 MG capsule Take 30 mg by mouth daily.   Past Week at Unknown time  . enoxaparin (LOVENOX) 40 MG/0.4ML injection Inject 0.4 mLs (40 mg total) into the skin daily for 14 days. 5.6 mL 0 08/11/2019 at Unknown time  . Exenatide ER (BYDUREON) 2 MG PEN Inject 2 mg into the skin once a week.   Past Week at Unknown time  . gabapentin (NEURONTIN) 300 MG capsule Take 3 capsules (900 mg  total) by mouth 3 (three) times daily.   Past Week at Unknown time  . Insulin Glargine (BASAGLAR KWIKPEN) 100 UNIT/ML Inject 80 Units into the skin daily.   Unknown at Unknown  . losartan (COZAAR) 100 MG tablet Take 1 tablet (100 mg total) by mouth daily. 30 tablet 0 Unknown at Unknown  . Melatonin 10 MG TABS Take 10 mg by mouth at bedtime as needed (sleep).   prn at prn  . metFORMIN (GLUCOPHAGE) 500 MG tablet Take 1,000 mg by mouth 2 (two) times daily with a meal.   Unknown at Unknown  . Multiple Vitamin (MULTIVITAMIN WITH MINERALS) TABS tablet Take 1 tablet by mouth daily.   Unknown at Unknown  . omeprazole (PRILOSEC) 20 MG capsule  Take 20 mg by mouth daily.    Unknown at Unknown  . ondansetron (ZOFRAN) 4 MG tablet Take 1 tablet (4 mg total) by mouth every 8 (eight) hours as needed for nausea or vomiting. 30 tablet 0 prn at prn  . oxyCODONE (OXY IR/ROXICODONE) 5 MG immediate release tablet Take 1 tablet (5 mg total) by mouth every 4 (four) hours as needed. 40 tablet 0 prn at prn  . rosuvastatin (CRESTOR) 20 MG tablet Take 20 mg by mouth daily.   Unknown at Unknown  . Blood Glucose Monitoring Suppl (ONE TOUCH ULTRA SYSTEM KIT) w/Device KIT 1 kit by Does not apply route once. Dx. E11.9 1 each 0   . glucose blood test strip 1 each by Other route 4 (four) times daily. Use with One touch meter to check blood sugar. Dx E11.9 100 each 12   . Insulin Pen Needle (BD PEN NEEDLE NANO U/F) 32G X 4 MM MISC Inject 1 each as directed 3 (three) times daily. 300 each 3   . ONE TOUCH LANCETS MISC 1 each by Does not apply route 4 (four) times daily. Use with one touch meter to check blood sugar. Dx: E11.9 200 each 12    Scheduled: . apixaban  10 mg Oral BID   Followed by  . [START ON 08/21/2019] apixaban  5 mg Oral BID  . aspirin EC  81 mg Oral Daily  . carvedilol  6.25 mg Oral BID WC  . DULoxetine  30 mg Oral Daily  . feeding supplement (ENSURE ENLIVE)  237 mL Oral BID BM  . [START ON 08/17/2019] furosemide  40 mg Oral Daily  . gabapentin  300 mg Oral TID  . guaiFENesin  600 mg Oral BID  . insulin aspart  0-15 Units Subcutaneous TID PC & HS  . insulin glargine  80 Units Subcutaneous Daily  . multivitamin with minerals  1 tablet Oral Daily  . pantoprazole  40 mg Oral Daily  . polyethylene glycol  17 g Oral Daily  . rosuvastatin  20 mg Oral Daily  . senna-docusate  1 tablet Oral BID    ROS: History obtained from the patient  General ROS: negative for - chills, fatigue, fever, night sweats, weight gain or weight loss Psychological ROS: memory difficulties Ophthalmic ROS: negative for - blurry vision, double vision, eye pain or loss of  vision ENT ROS: negative for - epistaxis, nasal discharge, oral lesions, sore throat, tinnitus or vertigo Allergy and Immunology ROS: negative for - hives or itchy/watery eyes Hematological and Lymphatic ROS: negative for - bleeding problems, bruising or swollen lymph nodes Endocrine ROS: negative for - galactorrhea, hair pattern changes, polydipsia/polyuria or temperature intolerance Respiratory ROS: negative for - cough, hemoptysis, shortness of breath or  wheezing Cardiovascular ROS: BLE edema Gastrointestinal ROS: negative for - abdominal pain, diarrhea, hematemesis, nausea/vomiting or stool incontinence Genito-Urinary ROS: negative for - dysuria, hematuria, incontinence or urinary frequency/urgency Musculoskeletal ROS: left leg pain and numbness, right shoulder pain Neurological ROS: as noted in HPI Dermatological ROS: negative for rash and skin lesion changes  Physical Examination: Blood pressure (!) 152/75, pulse 93, temperature 98.3 F (36.8 C), temperature source Oral, resp. rate 20, height 6' 9"  (2.057 m), weight (!) 155.2 kg, SpO2 90 %.  HEENT-  Normocephalic, no lesions, without obvious abnormality.  Normal external eye and conjunctiva.  Normal TM's bilaterally.  Normal auditory canals and external ears. Normal external nose, mucus membranes and septum.  Normal pharynx. Cardiovascular- S1, S2 normal, pulses palpable throughout   Lungs- chest clear, no wheezing, rales, normal symmetric air entry Abdomen- soft, non-tender; bowel sounds normal; no masses,  no organomegaly Extremities- BLE edema Lymph-no adenopathy palpable Musculoskeletal-no joint tenderness, deformity or swelling Skin-warm and dry, no hyperpigmentation, vitiligo, or suspicious lesions  Neurological Examination   Mental Status: Alert, oriented to name, place and month.  Able to perform simple calculations.  With continued conversation becomes tangential and unrelated to what is being discussed.  Speech fluent  without evidence of aphasia.  Able to follow 3 step commands but requires some reinforcement. Cranial Nerves: II: Visual fields grossly normal, pupils equal, round, reactive to light and accommodation III,IV, VI: ptosis not present, extra-ocular motions intact bilaterally V,VII: smile symmetric, facial light touch sensation normal bilaterally VIII: hearing normal bilaterally IX,X: gag reflex present XI: bilateral shoulder shrug XII: midline tongue extension Motor: Right : Upper extremity   Does not lift 2/2 pain, 5/5 hand grip    Left:     Upper extremity   5/5  Lower extremity   5/5          Lower extremity   4/5 Tone and bulk:normal tone throughout; no atrophy noted Sensory: Pinprick and light touch decreased in the LLE Deep Tendon Reflexes: Symmetric throughout Plantars: Right: mute   Left: mute Cerebellar: Normal finger-to-nose testing bilaterally Gait: not tested due to safety concerns    Laboratory Studies:   Basic Metabolic Panel: Recent Labs  Lab 08/12/19 0510 08/12/19 0510 08/13/19 0530 08/13/19 0530 08/14/19 0006 08/15/19 0520 08/16/19 0555  NA 139  --  136  --  136 136 135  K 3.9  --  4.0  --  3.6 4.2 4.1  CL 102  --  97*  --  94* 96* 95*  CO2 29  --  29  --  32 31 32  GLUCOSE 157*  --  175*  --  165* 182* 176*  BUN 19  --  25*  --  30* 32* 28*  CREATININE 0.70  --  0.87  --  1.01 0.80 0.72  CALCIUM 8.5*   < > 8.3*   < > 8.4* 8.7* 8.8*  MG  --   --  2.1  --  2.3 2.4 2.4   < > = values in this interval not displayed.    Liver Function Tests: Recent Labs  Lab 08/11/19 1645  AST 22  ALT 18  ALKPHOS 68  BILITOT 0.9  PROT 6.7  ALBUMIN 3.5   No results for input(s): LIPASE, AMYLASE in the last 168 hours. No results for input(s): AMMONIA in the last 168 hours.  CBC: Recent Labs  Lab 08/11/19 1645 08/12/19 0510 08/13/19 0530 08/13/19 1547 08/14/19 0006 08/15/19 0520 08/16/19 0555  WBC 11.8*   < >  9.9 10.5 8.8 8.2 8.5  NEUTROABS 9.3*  --   --    --   --   --   --   HGB 15.1   < > 13.5 14.3 13.4 12.6* 12.2*  HCT 46.8   < > 42.1 45.0 41.6 39.9 38.7*  MCV 87.3   < > 87.9 88.9 88.5 88.5 88.8  PLT 242   < > 213 259 232 235 234   < > = values in this interval not displayed.    Cardiac Enzymes: No results for input(s): CKTOTAL, CKMB, CKMBINDEX, TROPONINI in the last 168 hours.  BNP: Invalid input(s): POCBNP  CBG: Recent Labs  Lab 08/15/19 0735 08/15/19 1150 08/15/19 1636 08/15/19 2109 08/16/19 0816  GLUCAP 161* 255* 208* 186* 160*    Microbiology: Results for orders placed or performed during the hospital encounter of 08/11/19  Blood Culture (routine x 2)     Status: None   Collection Time: 08/11/19  7:43 PM   Specimen: BLOOD  Result Value Ref Range Status   Specimen Description BLOOD BLOOD LEFT FOREARM  Final   Special Requests   Final    BOTTLES DRAWN AEROBIC AND ANAEROBIC Blood Culture adequate volume   Culture   Final    NO GROWTH 5 DAYS Performed at [facility], [address], [place], [place] [address]    Report Status 08/16/2019 FINAL  Final  Blood Culture (routine x 2)     Status: None   Collection Time: 08/11/19  7:43 PM   Specimen: BLOOD  Result Value Ref Range Status   Specimen Description BLOOD LEFT ANTECUBITAL  Final   Special Requests   Final    BOTTLES DRAWN AEROBIC AND ANAEROBIC Blood Culture adequate volume   Culture   Final    NO GROWTH 5 DAYS Performed at [facility], [address] [place], [place] [address]    Report Status 08/16/2019 FINAL  Final  Respiratory Panel by RT PCR (Flu A&B, Covid) - Nasopharyngeal Swab     Status: None   Collection Time: 08/11/19  7:43 PM   Specimen: Nasopharyngeal Swab  Result Value Ref Range Status   SARS Coronavirus 2 by RT PCR NEGATIVE NEGATIVE Final    Comment: (NOTE) SARS-CoV-2 target nucleic acids are NOT DETECTED. The SARS-CoV-2 RNA is generally detectable in upper respiratoy specimens during the acute phase of infection. The  lowest concentration of SARS-CoV-2 viral copies this assay can detect is 131 copies/mL. A negative result does not preclude SARS-Cov-2 infection and should not be used as the sole basis for treatment or other patient management decisions. A negative result may occur with  improper specimen collection/handling, submission of specimen other than nasopharyngeal swab, presence of viral mutation(s) within the areas targeted by this assay, and inadequate number of viral copies (<131 copies/mL). A negative result must be combined with clinical observations, patient history, and epidemiological information. The expected result is Negative. Fact Sheet for Patients:  PinkCheek.be Fact Sheet for Healthcare Providers:  GravelBags.it This test is not yet ap proved or cleared by the [place] FDA and  has been authorized for detection and/or diagnosis of SARS-CoV-2 by FDA under an Emergency Use Authorization (EUA). This EUA will remain  in effect (meaning this test can be used) for the duration of the COVID-19 declaration under Section 564(b)(1) of the Act, 21 U.S.C. section 360bbb-3(b)(1), unless the authorization is terminated or revoked sooner.    Influenza A by PCR NEGATIVE NEGATIVE Final   Influenza  B by PCR NEGATIVE NEGATIVE Final    Comment: (NOTE) The Xpert Xpress SARS-CoV-2/FLU/RSV assay is intended as an aid in  the diagnosis of influenza from Nasopharyngeal swab specimens and  should not be used as a sole basis for treatment. Nasal washings and  aspirates are unacceptable for Xpert Xpress SARS-CoV-2/FLU/RSV  testing. Fact Sheet for Patients: PinkCheek.be Fact Sheet for Healthcare Providers: GravelBags.it This test is not yet approved or cleared by the [place] FDA and  has been authorized for detection and/or diagnosis of SARS-CoV-2 by  FDA under an Emergency  Use Authorization (EUA). This EUA will remain  in effect (meaning this test can be used) for the duration of the  Covid-19 declaration under Section 564(b)(1) of the Act, 21  U.S.C. section 360bbb-3(b)(1), unless the authorization is  terminated or revoked. Performed at [facility], [address] [place], [place] [address]     Coagulation Studies: No results for input(s): LABPROT, INR in the last 72 hours.  Urinalysis:  Recent Labs  Lab 08/11/19 1847  COLORURINE YELLOW*  LABSPEC 1.040*  PHURINE 7.0  GLUCOSEU >=500*  HGBUR NEGATIVE  BILIRUBINUR NEGATIVE  KETONESUR 5*  PROTEINUR NEGATIVE  NITRITE NEGATIVE  LEUKOCYTESUR NEGATIVE    Lipid Panel:     Component Value Date/Time   CHOL 199 11/04/2016 0951   TRIG 405 (H) 11/04/2016 0951   HDL 48 11/04/2016 0951   CHOLHDL 4.1 11/04/2016 0951   LDLCALC Comment 11/04/2016 0951    HgbA1C:  Lab Results  Component Value Date   HGBA1C 6.8 (H) 08/07/2019    Urine Drug Screen:  No results found for: LABOPIA, COCAINSCRNUR, LABBENZ, AMPHETMU, THCU, LABBARB  Alcohol Level: No results for input(s): ETH in the last 168 hours.  Other results: EKG: sinus tachycardia at 112 bpm.  Imaging: CT HEAD WO CONTRAST  Result Date: 08/15/2019 CLINICAL DATA:  Near syncope during a bowel movement 4 days ago. EXAM: CT HEAD WITHOUT CONTRAST TECHNIQUE: Contiguous axial images were obtained from the base of the skull through the vertex without intravenous contrast. COMPARISON:  None. 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 No evidence of hemorrhage, infarct, mass lesion, midline shift or abnormal extra-axial fluid collection is identified. No pneumocephalus. Vascular: No hyperdense vessel or unexpected calcification. Skull: Intact.  No focal lesion. Sinuses/Orbits: Negative. Other: None. IMPRESSION: Findings compatible with communicating hydrocephalus. The exam is  otherwise negative. Electronically Signed   By: Inge Rise M.D.   On: 08/15/2019 14:58     Assessment/Plan: 64 year old male with a known history of hypertension, dyslipidemia, type 2 diabetes mellitus, obstructive sleep apnea with noncompliance to CPAP, who presented to the emergency room with acute onset of presyncope while having a bowel movement.  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  MRI of the brain performed in follow up has been personally reviewed and compared to head CT.  MRI confirms this communicating hydrocephalus.  When compared to MRI of the cervical spine performed in 2020 there appears to be some hydrocephalus on that examination as well.  Patient does not have previous head imaging for comparison.  It is unlikely that this led to the patient's syncopal event.  It is unclear if it is related to his mental status issues since I suspect that it is not new.  Gait issues have now been present for some time (at least 2019) and were felt to be secondary to a peripheral neuropathy.  Can not  completely rule out NPH at this time but would rule out other possibilities as well.  Recommendations: 1. B1, B12, RPR, TSH, ESR 2. EEG 3. Patient to be evaluated by neurosurgery if above work up unremarkable.     Alexis Goodell, MD Neurology [phone] 08/16/2019, 10:40 AM   Addendum: Bonne Dolores to connect with wife in the room today but she had to leave before I arrived.  Spoke with wife over the phone today at her request (this was the first day I was made aware of a request to contact the wife).  Explained proposed diagnosis, plan and treatment.  EEG results discussed as well.  Patient was screaming, rude and eventually hung up the phone on me.  Would get neurosurgery involved sooner rather than later.  Thank you for allowing neurology to participate in the care of this patient.    Alexis Goodell, MD Neurology [phone]

## 2019-08-17 DIAGNOSIS — I5031 Acute diastolic (congestive) heart failure: Secondary | ICD-10-CM | POA: Diagnosis present

## 2019-08-17 DIAGNOSIS — I5033 Acute on chronic diastolic (congestive) heart failure: Secondary | ICD-10-CM | POA: Diagnosis present

## 2019-08-17 LAB — CBC
HCT: 38.2 % — ABNORMAL LOW (ref 39.0–52.0)
Hemoglobin: 12.1 g/dL — ABNORMAL LOW (ref 13.0–17.0)
MCH: 28 pg (ref 26.0–34.0)
MCHC: 31.7 g/dL (ref 30.0–36.0)
MCV: 88.4 fL (ref 80.0–100.0)
Platelets: 243 10*3/uL (ref 150–400)
RBC: 4.32 MIL/uL (ref 4.22–5.81)
RDW: 15.5 % (ref 11.5–15.5)
WBC: 9.3 10*3/uL (ref 4.0–10.5)
nRBC: 0 % (ref 0.0–0.2)

## 2019-08-17 LAB — BASIC METABOLIC PANEL
Anion gap: 9 (ref 5–15)
BUN: 29 mg/dL — ABNORMAL HIGH (ref 8–23)
CO2: 32 mmol/L (ref 22–32)
Calcium: 9.1 mg/dL (ref 8.9–10.3)
Chloride: 96 mmol/L — ABNORMAL LOW (ref 98–111)
Creatinine, Ser: 0.7 mg/dL (ref 0.61–1.24)
GFR calc Af Amer: >60 mL/min (ref >60)
GFR calc non Af Amer: >60 mL/min (ref >60)
Glucose, Bld: 202 mg/dL — ABNORMAL HIGH (ref 70–99)
Potassium: 3.9 mmol/L (ref 3.5–5.1)
Sodium: 137 mmol/L (ref 135–145)

## 2019-08-17 LAB — RPR: RPR Ser Ql: NONREACTIVE

## 2019-08-17 LAB — MAGNESIUM: Magnesium: 2.3 mg/dL (ref 1.7–2.4)

## 2019-08-17 LAB — GLUCOSE, CAPILLARY
Glucose-Capillary: 218 mg/dL — ABNORMAL HIGH (ref 70–99)
Glucose-Capillary: 295 mg/dL — ABNORMAL HIGH (ref 70–99)

## 2019-08-17 MED ORDER — CARVEDILOL 12.5 MG PO TABS: 12.5000 mg | ORAL_TABLET | Freq: Two times a day (BID) | ORAL | Status: DC

## 2019-08-17 MED ORDER — VITAMIN B-12 1000 MCG PO TABS
1000.0000 ug | ORAL_TABLET | Freq: Every day | ORAL | Status: DC
  Administered 2019-08-17: 10:00:00 1000 ug via ORAL
  Filled 2019-08-17: qty 1

## 2019-08-17 MED ORDER — FUROSEMIDE 40 MG PO TABS: 40.0000 mg | ORAL_TABLET | Freq: Every day | ORAL | Status: DC

## 2019-08-17 MED ORDER — CYANOCOBALAMIN 500 MCG PO TABS: 500.0000 ug | ORAL_TABLET | Freq: Every day | ORAL | Status: DC

## 2019-08-17 MED ORDER — APIXABAN 5 MG PO TABS: 10.0000 mg | ORAL_TABLET | Freq: Two times a day (BID) | ORAL | Status: DC

## 2019-08-17 MED ORDER — APIXABAN 5 MG PO TABS: 5.0000 mg | ORAL_TABLET | Freq: Two times a day (BID) | ORAL | Status: DC

## 2019-08-17 NOTE — [place] FL2 (Signed)
[place] MEDICAID FL2 LEVEL OF CARE SCREENING TOOL     IDENTIFICATION  Patient Name: Nathan Dean Birthdate: Feb 07, 1956 Sex: male Admission Date (Current Location): 08/11/2019  [place] and [place] Number:  Chiropodist and Address:  [facility], [address], [place], [place] [address]      Provider Number: [id number]  Attending Physician Name and Address:  Lurene Shadow, MD  Relative Name and Phone Number:  [phone]    Current Level of Care: Hospital Recommended Level of Care: Skilled Nursing Facility Prior Approval Number:    Date Approved/Denied:   PASRR Number: [id number]  Discharge Plan: SNF    Current Diagnoses: Patient Active Problem List   Diagnosis Date Noted  . Acute diastolic CHF (congestive heart failure) (HCC) 08/17/2019  . Edema   . Acute metabolic encephalopathy 08/12/2019  . Acute pulmonary edema (HCC) 08/12/2019  . Acute cor pulmonale (HCC) 08/11/2019  . Oxygen desaturation 08/08/2019  . S/P right rotator cuff repair 08/07/2019  . Hypoxia 10/22/2018  . Infection of right prepatellar bursa 10/18/2018  . Sepsis (HCC) 10/18/2018  . Chest congestion 07/20/2017  . Viral illness 07/20/2017  . Lower respiratory infection 07/12/2017  . Laryngitis, acute 07/12/2017  . Cough 07/12/2017  . Pleurisy 07/12/2017  . Depression 06/03/2015  . HTN (hypertension) 01/02/2015  . Hyperlipidemia 01/02/2015  . 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 Sleep apnea 09/21/2010  . Arthritis, degenerative 02/17/2009  . Hereditary and idiopathic peripheral neuropathy 11/21/2008  . AD (atopic dermatitis) 07/08/2008  . Acid reflux 04/05/2007    Orientation RESPIRATION BLADDER Height & Weight     Self, Time, Situation, Place  Normal Incontinent Weight: (!) 155.2 kg Height:  6\' 9"  (205.7 cm)  BEHAVIORAL SYMPTOMS/MOOD NEUROLOGICAL BOWEL NUTRITION STATUS      Incontinent Diet   AMBULATORY STATUS COMMUNICATION OF NEEDS Skin   Extensive Assist Verbally Surgical wounds                       Personal Care Assistance Level of Assistance  Dressing, Feeding, Bathing Bathing Assistance: Maximum assistance Feeding assistance: Maximum assistance Dressing Assistance: Maximum assistance     Functional Limitations Info  Sight, Hearing, Speech Sight Info: Adequate Hearing Info: Adequate Speech Info: Adequate    SPECIAL CARE FACTORS FREQUENCY  PT (By licensed PT), OT (By licensed OT)     PT Frequency: 5x a week OT Frequency: tx a week            Contractures Contractures Info: Not present    Additional Factors Info  Code Status, Allergies Code Status Info: Full Code Allergies Info: Penicillins, succinylcholine chloride, Keflex, Sulfa Antibiotics           Current Medications (08/17/2019):  This is the current hospital active medication list Current Facility-Administered Medications  Medication Dose Route Frequency Provider Last Rate Last Admin  . acetaminophen (TYLENOL) tablet 650 mg  650 mg Oral Q6H PRN Mansy, Jan A, MD   650 mg at 08/16/19 10/16/19   Or  . acetaminophen (TYLENOL) suppository 650 mg  650 mg Rectal Q6H PRN Mansy, Jan A, MD      . apixaban (ELIQUIS) tablet 10 mg  10 mg Oral BID [id number], MD   10 mg at 08/17/19 0941   Followed by  . [START ON 08/21/2019] apixaban (ELIQUIS) tablet 5 mg  5 mg Oral BID 10/21/2019, MD      .  aspirin EC tablet 81 mg  81 mg Oral Daily Mansy, Jan A, MD   81 mg at 08/17/19 0941  . bisacodyl (DULCOLAX) EC tablet 5 mg  5 mg Oral Daily PRN Mansy, Jan A, MD   5 mg at 08/12/19 0003  . carvedilol (COREG) tablet 6.25 mg  6.25 mg Oral BID WC Lurene Shadow, MD   6.25 mg at 08/17/19 0942  . chlorpheniramine-HYDROcodone (TUSSIONEX) 10-8 MG/5ML suspension 5 mL  5 mL Oral Q12H PRN Mansy, Jan A, MD      . DULoxetine (CYMBALTA) DR capsule 30 mg  30 mg Oral Daily Mansy, Jan A, MD   30 mg at 08/17/19 0941  . feeding  supplement (ENSURE ENLIVE) (ENSURE ENLIVE) liquid 237 mL  237 mL Oral BID BM Rolly Salter, MD   237 mL at 08/17/19 1142  . furosemide (LASIX) tablet 40 mg  40 mg Oral Daily Lurene Shadow, MD   40 mg at 08/17/19 0941  . gabapentin (NEURONTIN) capsule 300 mg  300 mg Oral TID Rolly Salter, MD   300 mg at 08/17/19 0941  . guaiFENesin (MUCINEX) 12 hr tablet 600 mg  600 mg Oral BID Mansy, Jan A, MD   600 mg at 08/17/19 0941  . insulin aspart (novoLOG) injection 0-15 Units  0-15 Units Subcutaneous TID PC & HS Mansy, Vernetta Honey, MD   3 Units at 08/17/19 [phone]  . insulin glargine (LANTUS) injection 80 Units  80 Units Subcutaneous Daily Mansy, Vernetta Honey, MD   80 Units at 08/17/19 0939  . labetalol (NORMODYNE) injection 20 mg  20 mg Intravenous Q3H PRN Mansy, Jan A, MD      . magnesium hydroxide (MILK OF MAGNESIA) suspension 30 mL  30 mL Oral Daily PRN Mansy, Jan A, MD   30 mL at 08/12/19 0005  . melatonin tablet 10 mg  10 mg Oral QHS PRN Mansy, Jan A, MD   10 mg at 08/14/19 2052  . morphine 2 MG/ML injection 2 mg  2 mg Intravenous Q4H PRN Rolly Salter, MD   2 mg at 08/13/19 0902  . multivitamin with minerals tablet 1 tablet  1 tablet Oral Daily Mansy, Jan A, MD   1 tablet at 08/17/19 0941  . ondansetron (ZOFRAN) tablet 4 mg  4 mg Oral Q6H PRN Mansy, Jan A, MD       Or  . ondansetron [facility]) injection 4 mg  4 mg Intravenous Q6H PRN Mansy, Jan A, MD      . oxyCODONE (Oxy IR/ROXICODONE) immediate release tablet 5-10 mg  5-10 mg Oral Q3H PRN Juanell Fairly, MD   10 mg at 08/16/19 2229  . pantoprazole (PROTONIX) EC tablet 40 mg  40 mg Oral Daily Mansy, Jan A, MD   40 mg at 08/17/19 0941  . polyethylene glycol (MIRALAX / GLYCOLAX) packet 17 g  17 g Oral Daily Rolly Salter, MD   17 g at 08/14/19 7989  . rosuvastatin (CRESTOR) tablet 20 mg  20 mg Oral Daily Mansy, Jan A, MD   20 mg at 08/16/19 1744  . senna-docusate (Senokot-S) tablet 1 tablet  1 tablet Oral BID Rolly Salter, MD   1 tablet at 08/16/19 2145   . simethicone (MYLICON) chewable tablet 80 mg  80 mg Oral QID PRN Lurene Shadow, MD   80 mg at 08/16/19 1744  . sodium phosphate (FLEET) 7-19 GM/118ML enema 1 enema  1 enema Rectal Daily PRN Mansy, Vernetta Honey, MD  1 enema at 08/12/19 0005  . traMADol (ULTRAM) tablet 50 mg  50 mg Oral Q6H PRN Thornton Park, MD   50 mg at 08/16/19 0137  . traZODone (DESYREL) tablet 25 mg  25 mg Oral QHS PRN Mansy, Jan A, MD   25 mg at 08/13/19 2120  . vitamin B-12 (CYANOCOBALAMIN) tablet 1,000 mcg  1,000 mcg Oral Daily Jennye Boroughs, MD   1,000 mcg at 08/17/19 9935     Discharge Medications: Please see discharge summary for a list of discharge medications.  Relevant Imaging Results:  Relevant Lab Results:   Additional Information [id number]  Victorino Dike, RN

## 2019-08-17 NOTE — Progress Notes (Signed)
Discharge instructions placed in transfer folder for [facility]

## 2019-08-17 NOTE — Progress Notes (Signed)
Gave report to [facility]

## 2019-08-17 NOTE — Progress Notes (Signed)
Physical Therapy Treatment Patient Details Name: Nathan Dean MRN: [id number] DOB: 1956/03/09 Today's Date: 08/17/2019    History of Present Illness Per MD notes: Pt is 64 yo male who presented to the emergency room with acute onset of presyncope while having a bowel movement.  MD assessment includes acute on chronic hypoxic respiratory failure, acute pulmonary edema/acute on chronic HFpEF, hypertensive urgency, near syncope, and RLE DVT.  Of note pt with recent R shoulder arthroscopy 08/07/19 with subacromial decompression and distal clavicle excision, biceps tenotomy, and rotator cuff repair.  PMH also includes OSA, HTN, GERD, DM, and TKA.    PT Comments    Pt was initially asleep and lethargic but after starting with in-bed exercises, pt was pleasant and motivated to participate during the session.  PT demonstrated improvements with bed mobility and only required min assist with rails for rolling. Pt was transferred to the recliner using the [facility] lift. Initially, when in supine, pt had a BP of 122/85, and in sitting, pt's BP was 112/76. Pt will benefit from PT services in a SNF setting upon discharge to safely address deficits listed in patient problem list for decreased caregiver assistance and eventual return to PLOF.    Follow Up Recommendations  SNF     Equipment Recommendations  3in1 (PT)(bariatric)    Recommendations for Other Services       Precautions / Restrictions Precautions Precautions: Fall;Shoulder Shoulder Interventions: Shoulder sling/immobilizer;Shoulder abduction pillow Precaution Booklet Issued: No Precaution Comments: Per Dr. Martha Clan: "He will leave the sling off while resting in bed.   Patient will wear the sling when up with PT." Required Braces or Orthoses: Sling Restrictions Weight Bearing Restrictions: Yes RUE Weight Bearing: Non weight bearing Other Position/Activity Restrictions: From recent admission: Per MD, prefers pt to be NWB to RUE, but would  permit a "light lean" of R elbow on platform walker    Mobility  Bed Mobility   Bed Mobility: Rolling Rolling: Min assist         General bed mobility comments: With use of bed rails, pt was min A for rolling  Transfers                 General transfer comment: Hoyer lift from bed to chair  Ambulation/Gait                 Stairs             Wheelchair Mobility    Modified Rankin (Stroke Patients Only)       Balance                                            Cognition Arousal/Alertness: Awake/alert;Lethargic(Initially lethargic but after completing a few exercise, pt became more alert/awake) Behavior During Therapy: WFL for tasks assessed/performed Overall Cognitive Status: Impaired/Different from baseline                                 General Comments: Patient states he still feels "out of it"      Exercises Total Joint Exercises Ankle Circles/Pumps: AROM;Strengthening;Both;10 reps Quad Sets: Strengthening;Both;10 reps Gluteal Sets: Strengthening;Both;10 reps Heel Slides: AROM;Strengthening;Both;10 reps Hip ABduction/ADduction: AAROM;Both;10 reps Straight Leg Raises: AAROM;Both;10 reps General Exercises - Upper Extremity Shoulder Flexion: AROM;Strengthening;Left;Supine;15 reps Elbow Flexion: AROM;Strengthening;Supine;Left;15 reps Other Exercises: Extensive monitoring of pt tolerance  to [facility] lift, RUE positioning, and pt tolerance to upright sitting position. Other Exercises: Audiological scientist Comments        Pertinent Vitals/Pain Pain Score: 6  Pain Location: R shoulder Pain Descriptors / Indicators: Grimacing;Moaning Pain Intervention(s): Monitored during session    Home Living                      Prior Function            PT Goals (current goals can now be found in the care plan section) Progress towards PT goals: Progressing toward goals    Frequency     7X/week      PT Plan Current plan remains appropriate    Co-evaluation              AM-PAC PT "6 Clicks" Mobility   Outcome Measure  Help needed turning from your back to your side while in a flat bed without using bedrails?: A Little Help needed moving from lying on your back to sitting on the side of a flat bed without using bedrails?: A Lot Help needed moving to and from a bed to a chair (including a wheelchair)?: Total Help needed standing up from a chair using your arms (e.g., wheelchair or bedside chair)?: Total Help needed to walk in hospital room?: Total Help needed climbing 3-5 steps with a railing? : Total 6 Click Score: 9    End of Session Equipment Utilized During Treatment: Oxygen Activity Tolerance: Patient tolerated treatment well Patient left: in chair;with call bell/phone within reach;with chair alarm set;with family/visitor present;with nursing/sitter in room(Polar care on) Nurse Communication: Mobility status;Precautions;Weight bearing status(Pt left in recliner with hoyer lift equipment in room) PT Visit Diagnosis: Other abnormalities of gait and mobility (R26.89);Muscle weakness (generalized) (M62.81);Pain Pain - Right/Left: Right Pain - part of body: Shoulder     Time: 3762-8315 PT Time Calculation (min) (ACUTE ONLY): 58 min  Charges:                        Annabelle Harman, SPT 08/17/19 1:31 PM

## 2019-08-17 NOTE — Consult Note (Signed)
Referring Physician:  No referring provider defined for this encounter.  Primary Physician:  Ethelda Chick, MD  Chief Complaint: Mental status change, balance and gait issues, hydrocephalus  History of Present Illness: Nathan Dean is a 64 y.o. male who presents as a consult for mental status change/balance/gait issues in the setting of hydrocephalus finding on recent imaging.  Wife was present in the room and assisted with history. Wife and patient explain a complicated postoperative recovery since right shoulder surgery on 08/07/2019.  Most concerning were inability to keep up his O2 saturation.  Presented to the ED on 3/27 after a near syncopal episode while having a BM.  During current admission, patient experienced a day where he was unable to recall his name and head CT was performed for further evaluation.  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 Today on evaluation, patient denies headache, confusion, vision changes, upper or lower extremity complaints.  Wife states that he is aggravated but he is at normal mental baseline.  Only complaint is walking has been "off" but he has a history of gait issues due to neuropathy.  Review of Systems:  A 10 point review of systems is negative, except for the pertinent positives and negatives detailed in the HPI.  Past Medical History: Past Medical History:  Diagnosis Date  . 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 Diabetes (HCC)   . GERD (gastroesophageal reflux disease)   . Hyperlipidemia   . Hypertension   . Obesity   . Sleep apnea     Past Surgical History: Past Surgical History:  Procedure Laterality Date  . BACK SURGERY     in the 80's -herniated disc  . BACK SURGERY     L4 bone spur 1990  . CARPAL TUNNEL RELEASE Right 12/28/2016   Procedure: RIGHT  ULNAR AND MEDIAN NEUROPLASTY AT WRIST;  Surgeon: Mack Hook, MD;  [facility];  Service: Orthopedics;  Laterality: Right;  . CARPAL TUNNEL RELEASE Left    25 years ago  . CATARACT EXTRACTION W/ INTRAOCULAR LENS IMPLANT Bilateral   . 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 SHOULDER OPEN ROTATOR CUFF REPAIR Right 08/07/2019   Procedure: ROTATOR CUFF REPAIR SHOULDER OPEN;  Surgeon: Juanell Fairly, MD;  Location: [facility];  Service: Orthopedics;  Laterality: Right;  . TONSILLECTOMY      Allergies: Allergies as of 08/11/2019 - Review Complete 08/11/2019  Allergen Reaction Noted  . Penicillins Anaphylaxis, Rash, and Other (See Comments) 12/23/2014  . Succinylcholine chloride Anaphylaxis and Rash 01/02/2015  . Keflex [cephalexin] Rash 12/23/2014  . Sulfa antibiotics Rash and Other (See Comments) 12/23/2014    Medications:  Current Facility-Administered Medications:  .  acetaminophen (TYLENOL) tablet 650 mg, 650 mg, Oral, Q6H PRN, 650 mg at 08/16/19 0338 **OR** acetaminophen (TYLENOL) suppository 650 mg, 650 mg, Rectal, Q6H PRN, Mansy, Jan A, MD .  apixaban (ELIQUIS) tablet 10 mg, 10 mg, Oral, BID, 10 mg at 08/17/19 0941 **FOLLOWED BY** [START ON 08/21/2019] apixaban (ELIQUIS) tablet 5 mg, 5 mg, Oral, BID, Rolly Salter, MD .  aspirin EC tablet 81 mg, 81 mg, Oral, Daily, Mansy, Jan A, MD, 81 mg at 08/17/19 0941 .  bisacodyl (DULCOLAX) EC tablet 5 mg, 5 mg, Oral, Daily PRN, Mansy, Jan A, MD, 5 mg at 08/12/19 0003 .  carvedilol (COREG) tablet 6.25 mg, 6.25 mg, Oral, BID WC, Lurene Shadow, MD, 6.25 mg at 08/17/19 0942 .  chlorpheniramine-HYDROcodone (TUSSIONEX) 10-8 MG/5ML suspension 5 mL, 5 mL, Oral, Q12H PRN, Mansy, Jan A, MD .  DULoxetine (CYMBALTA) DR capsule 30 mg, 30 mg, Oral, Daily, Mansy, Jan A, MD, 30 mg at 08/17/19 0941 .  feeding supplement (ENSURE ENLIVE) (ENSURE ENLIVE) liquid 237 mL, 237 mL, Oral, BID BM, Rolly Salter, MD, 237  mL at 08/17/19 1142 .  furosemide (LASIX) tablet 40 mg, 40 mg, Oral, Daily, Lurene Shadow, MD, 40 mg at 08/17/19 0941 .  gabapentin (NEURONTIN) capsule 300 mg, 300 mg, Oral, TID, Rolly Salter, MD, 300 mg at 08/17/19 0941 .  guaiFENesin (MUCINEX) 12 hr tablet 600 mg, 600 mg, Oral, BID, Mansy, Jan A, MD, 600 mg at 08/17/19 0941 .  insulin aspart (novoLOG) injection 0-15 Units, 0-15 Units, Subcutaneous, TID PC & HS, Mansy, Vernetta Honey, MD, 3 Units at 08/17/19 [phone] .  insulin glargine (LANTUS) injection 80 Units, 80 Units, Subcutaneous, Daily, Mansy, Vernetta Honey, MD, 80 Units at 08/17/19 0939 .  labetalol (NORMODYNE) injection 20 mg, 20 mg, Intravenous, Q3H PRN, Mansy, Jan A, MD .  magnesium hydroxide (MILK OF MAGNESIA) suspension 30 mL, 30 mL, Oral, Daily PRN, Mansy, Jan A, MD, 30 mL at 08/12/19 0005 .  melatonin tablet 10 mg, 10 mg, Oral, QHS PRN, Mansy, Jan A, MD, 10 mg at 08/14/19 2052 .  morphine 2 MG/ML injection 2 mg, 2 mg, Intravenous, Q4H PRN, Rolly Salter, MD, 2 mg at 08/13/19 0902 .  multivitamin with minerals tablet 1 tablet, 1 tablet, Oral, Daily, Mansy, Jan A, MD, 1 tablet at 08/17/19 0941 .  ondansetron (ZOFRAN) tablet 4 mg, 4 mg, Oral, Q6H PRN **OR** ondansetron (ZOFRAN) injection 4 mg, 4 mg, Intravenous, Q6H PRN, Mansy, Jan A, MD .  [DISCONTINUED] oxyCODONE-acetaminophen (PERCOCET/ROXICET) 5-325 MG per tablet 1 tablet, 1 tablet, Oral, Q3H PRN **AND** oxyCODONE (Oxy IR/ROXICODONE) immediate release tablet 5-10 mg, 5-10 mg, Oral, Q3H PRN, Juanell Fairly, MD, 10 mg at 08/16/19 3338 .  pantoprazole (PROTONIX) EC tablet 40 mg, 40 mg, Oral, Daily, Mansy, Jan A, MD, 40 mg at 08/17/19 0941 .  polyethylene glycol (MIRALAX / GLYCOLAX) packet 17 g, 17 g, Oral, Daily, Rolly Salter, MD, 17 g at 08/14/19 0837 .  rosuvastatin (CRESTOR) tablet 20 mg, 20 mg, Oral, Daily, Mansy, Jan A, MD, 20 mg at 08/16/19 1744 .  senna-docusate (Senokot-S) tablet 1 tablet, 1 tablet, Oral, BID, Rolly Salter, MD, 1  tablet at 08/16/19 2145 .  simethicone (MYLICON) chewable tablet 80 mg, 80 mg, Oral, QID PRN, Lurene Shadow, MD, 80 mg at 08/16/19 1744 .  sodium phosphate (FLEET) 7-19 GM/118ML enema 1 enema, 1 enema, Rectal, Daily PRN, Mansy, Vernetta Honey, MD, 1 enema at 08/12/19 0005 .  traMADol (ULTRAM) tablet 50 mg, 50 mg, Oral, Q6H PRN, Juanell Fairly, MD, 50 mg at 08/16/19 0137 .  traZODone (DESYREL) tablet 25 mg, 25 mg, Oral, QHS PRN, Mansy, Jan A, MD, 25 mg at 08/13/19 2120 .  vitamin B-12 (CYANOCOBALAMIN) tablet 1,000 mcg, 1,000 mcg, Oral, Daily, Lurene Shadow, MD, 1,000 mcg at 08/17/19 3291   Social History: Social History   Tobacco Use  . Smoking status: Former Smoker    Packs/day: 2.00    Years: 20.00    Pack years: 40.00    Types: Cigarettes    Quit date: 06/28/1996    Years since quitting: 23.1  .  Smokeless tobacco: Never Used  Substance Use Topics  . Alcohol use: Not Currently    Alcohol/week: 0.0 standard drinks  . Drug use: No    Family Medical History: Family History  Problem Relation Age of Onset  . Ovarian cancer Mother   . Kidney failure Father   . Cancer Brother     Physical Examination: Vitals:   08/17/19 0758 08/17/19 1150  BP: 131/84 139/67  Pulse: 98 (!) 101  Resp: 18 19  Temp: 98.7 F (37.1 C) 98.5 F (36.9 C)  SpO2: 98% 98%     General: Patient is well developed, well nourished, calm, collected, and in no apparent distress.  Psychiatric: Patient is non-anxious.  Head:  Pupils equal, round, and reactive to light.  ENT:  Oral mucosa appears well hydrated.  Neck:   Supple.  Full range of motion.  Respiratory: Patient is breathing without any difficulty.  Skin:   Healing wound right knee.  NEUROLOGICAL:  General: In no acute distress.   Awake, alert, oriented to person, place, and time.  Pupils equal round and reactive to light.  Facial tone is symmetric, sensation intact.  Tongue protrusion is midline. EOMI Able to correctly identify 5 objects  without hesitation or difficulty.  Strength: Side Biceps Triceps Deltoid Grip  R - - - 5  L 5 5 5 5   *Right arm in sling due to recent surgery.  Strength testing not performed. Side Iliopsoas Quads Hamstring PF DF EHL  R 5 5 5 5 4 2   L 5 5 5 5 5 5    Reflexes are 1+ and symmetric at the patella and achilles.   Bilateral upper and lower extremity sensation is intact to light touch.  Clonus is not present.  Gait not assessed. Hoffman's is absent.  Imaging: EXAM: CT HEAD WITHOUT CONTRAST 08/15/2019  IMPRESSION: Findings compatible with communicating hydrocephalus. The exam is otherwise negative.  EXAM: MRI HEAD WITHOUT CONTRAST 08/16/2019  IMPRESSION: Partially motion degraded.  No acute infarction, hemorrhage, or mass.  Communicating hydrocephalus. A similar degree hydrocephalus may have been present on a 2020 cervical spine MRI.  Mild to moderate chronic microvascular ischemic changes. 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   Assessment and Plan: Mr. Lortie is a pleasant 64 y.o. male with episode of confusion and persistent gait issues with hydrocephalus finding on recent imaging.  Briefly discussed image findings with patient and wife.  Per Dr. Izora Ribas, they may follow-up as an outpatient in neurosurgery clinic to discuss lumbar drain.  The wife strongly feels that confusion episode related to exhaustion and stress but were open to discuss situation with Dr. Izora Ribas in clinic.  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  Surgical intervention not recommended at this time - clear for discharge from our standpoint.   Thank you for allowing [name] to participate in the care of this patient.  Marin Olp, PA-C Dept. of Neurosurgery

## 2019-08-17 NOTE — Progress Notes (Signed)
Inpatient Diabetes Program Recommendations  AACE/ADA: New Consensus Statement on Inpatient Glycemic Control (2015)  Target Ranges:  Prepandial:   less than 140 mg/dL      Peak postprandial:   less than 180 mg/dL (1-2 hours)      Critically ill patients:  140 - 180 mg/dL   Results for Nathan Dean, Nathan Dean (MRN [id number]) as of 08/17/2019 08:48  Ref. Range 08/15/2019 07:35 08/15/2019 11:50 08/15/2019 16:36 08/15/2019 21:09  Glucose-Capillary Latest Ref Range: 70 - 99 mg/dL 626 (H)  3 units NOVOLOG +  80 units LANTUS  255 (H)  8 units NOVOLOG  208 (H)  5 units NOVOLOG  186 (H)  3 units NOVOLOG    Results for Nathan Dean, Nathan Dean (MRN [id number]) as of 08/17/2019 08:48  Ref. Range 08/16/2019 08:16 08/16/2019 12:01 08/16/2019 17:41 08/16/2019 21:37  Glucose-Capillary Latest Ref Range: 70 - 99 mg/dL 350 (H)  3 units NOVOLOG  219 (H)  5 units NOVOLOG  212 (H)  5 units NOVOLOG  153 (H)    Home DM Meds: Basaglar 80 units Daily                              Bydureon 2 mg Qweek                             Metformin 1000 mg BID    Current Orders: Lantus 80 units Daily                            Novolog Moderate Correction Scale/ SSI (0-15 units) TID AC + HS    MD- Please consider adding Novolog Meal Coverage to pt's current inpatient insulin regimen:  Novolog 4 units TID with meals  (Please add the following Hold Parameters: Hold if pt eats <50% of meal, Hold if pt NPO)    --Will follow patient during hospitalization--  Ambrose Finland RN, MSN, CDE Diabetes Coordinator Inpatient Glycemic Control Team Team Pager: [phone] (8a-5p)

## 2019-08-17 NOTE — Care Management Important Message (Signed)
Important Message  Patient Details  Name: Nathan Dean MRN: [id number] Date of Birth: 1956-04-25   Medicare Important Message Given:  Yes     [name] 08/17/2019, 12:19 PM

## 2019-08-17 NOTE — Discharge Summary (Signed)
Physician Discharge Summary  Nathan Dean FIE:[id number] DOB: June 03, 1955 DOA: 08/11/2019  PCP: Wardell Honour, MD  Admit date: 08/11/2019 Discharge date: 08/17/2019  Discharge disposition: Skilled nursing facility   Recommendations for Outpatient Follow-Up:   Follow-up for the heart failure clinic as scheduled. Follow-up with physician at the nursing home within 3 days of discharge  Discharge Diagnosis:   Principal Problem:   Acute diastolic CHF (congestive heart failure) (HCC) Active Problems:   Acute cor pulmonale (HCC)   Acute metabolic encephalopathy   Acute pulmonary edema (HCC)   Edema    Discharge Condition: Stable.  Diet recommendation: Low salt and diabetic diet  Code status: Full code.    Hospital Course:   [name] is a 64 y.o. male with past medical history of HTN, HLD, type II DM, OSA noncompliant with CPAP. He presented with complaints of near syncope while having a bowel movement.  He also complained of shortness of breath on exertion, orthopnea and increasing leg swelling.  His wife reported frequent falls at home (which he attributed to peripheral neuropathy) for a few years and intermittent confusion for the past few months.. He was recently hospitalized for rotator cuff tear repair and he was discharged to SNF on 08/09/2019.  He was admitted to the hospital for hypertensive urgency, acute on chronic hypoxemic respiratory failure and acute diastolic CHF.  He was treated with IV Lasix and oxygen via nasal cannula.  He was also found to have right lower extremity DVT (posterior tibial vein).  He was started on Eliquis for DVT.  He was previously on aspirin but this has been discontinued.  He was seen in the hospital by the neurologist for worsening confusion/altered mental status.  CT head and MRI brain did not show any evidence of acute stroke.  However there was evidence of previous stroke on MRI brain.  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  Neurosurgery was consulted for this and outpatient follow-up was recommended.  This was discussed with Dr. Cari Caraway, neurosurgeon.  He has also been started on vitamin B12 supplement for borderline low vitamin B12 level.  His condition has improved and he is deemed stable for discharge to SNF today.  Discharge plan was discussed with the patient and his wife at the bedside.   Consulting physicians:  Cardiologist Neurologist Orthopedic surgeon Neurosurgeon  Discharge Exam:   Vitals:   08/17/19 0758 08/17/19 1150  BP: 131/84 139/67  Pulse: 98 (!) 101  Resp: 18 19  Temp: 98.7 F (37.1 C) 98.5 F (36.9 C)  SpO2: 98% 98%   Vitals:   08/16/19 2001 08/17/19 0531 08/17/19 0758 08/17/19 1150  BP: (!) 158/83 (!) 153/73 131/84 139/67  Pulse: 95 98 98 (!) 101  Resp:  16 18 19   Temp: 99.3 F (37.4 C) 98.2 F (36.8 C) 98.7 F (37.1 C) 98.5 F (36.9 C)  TempSrc: Oral  Oral Oral  SpO2: 100% 92% 98% 98%  Weight:      Height:         GEN: NAD SKIN: No rash EYES: EOMI ENT: MMM CV: RRR PULM: CTA B ABD: soft, obese, NT, +BS CNS: AAO x 3, non focal EXT: No edema or tenderness   The results of significant diagnostics from this hospitalization (including imaging, microbiology, ancillary and laboratory) are listed below for reference.     Procedures and Diagnostic Studies:   US Venous Img Lower Bilateral (DVT)  Result Date: 08/12/2019 CLINICAL DATA:  Bilateral lower extremity edema. EXAM: BILATERAL LOWER EXTREMITY VENOUS DOPPLER ULTRASOUND TECHNIQUE: Gray-scale sonography with graded compression, as well as color Doppler and duplex ultrasound were performed to evaluate the lower extremity deep venous systems from the level of the common femoral vein and including the common femoral, femoral, profunda femoral, popliteal and calf veins including the posterior tibial, peroneal and gastrocnemius veins when visible. The superficial great saphenous  vein was also interrogated. Spectral Doppler was utilized to evaluate flow at rest and with distal augmentation maneuvers in the common femoral, femoral and popliteal veins. COMPARISON:  None. FINDINGS: RIGHT LOWER EXTREMITY Common Femoral Vein: No evidence of thrombus. Normal compressibility, respiratory phasicity and response to augmentation. Saphenofemoral Junction: No evidence of thrombus. Normal compressibility and flow on color Doppler imaging. Profunda Femoral Vein: No evidence of thrombus. Normal compressibility and flow on color Doppler imaging. Femoral Vein: No evidence of thrombus. Normal compressibility, respiratory phasicity and response to augmentation. Popliteal Vein: No evidence of thrombus. Normal compressibility, respiratory phasicity and response to augmentation. Calf Veins: There appears to be likely occlusive thrombus in the right posterior tibial vein. The right peroneal vein is normally patent. Superficial Great Saphenous Vein: No evidence of thrombus. Normal compressibility. Venous Reflux:  None. Other Findings: No evidence of superficial thrombophlebitis or abnormal fluid collection. LEFT LOWER EXTREMITY Common Femoral Vein: No evidence of thrombus. Normal compressibility, respiratory phasicity and response to augmentation. Saphenofemoral Junction: No evidence of thrombus. Normal compressibility and flow on color Doppler imaging. Profunda Femoral Vein: No evidence of thrombus. Normal compressibility and flow on color Doppler imaging. Femoral Vein: No evidence of thrombus. Normal compressibility, respiratory phasicity and response to augmentation. Popliteal Vein: No evidence of thrombus. Normal compressibility, respiratory phasicity and response to augmentation. Calf Veins: No evidence of thrombus. Normal compressibility and flow on color Doppler imaging. Superficial Great Saphenous Vein: No evidence of thrombus. Normal compressibility. Venous Reflux:  None. Other Findings: No evidence of  superficial thrombophlebitis or abnormal fluid collection. IMPRESSION: Deep vein thrombus isolated to the right posterior tibial vein in the calf. Other deep veins in both lower extremities are normally patent. Electronically Signed   By: Aletta Edouard M.D.   On: 08/12/2019 12:03   ECHOCARDIOGRAM COMPLETE  Result Date: 08/12/2019    ECHOCARDIOGRAM REPORT   Patient Name:   KAIDON KINKER Date of Exam: 08/12/2019 Medical Rec #:  [id number]       Height:       81.0 in Accession #:    [id number]      Weight:       360.3 lb Date of Birth:  1955-11-14        BSA:          2.979 m Patient Age:    28 years        BP:           119/76 mmHg Patient Gender: M               HR:           91 bpm. Exam Location:  [facility] Procedure: 2D Echo and Intracardiac Opacification Agent Indications:     Diastolic CHF  History:         Patient has prior history of Echocardiogram examinations. Risk                  Factors:Hypertension, Diabetes and Dyslipidemia.  Sonographer:     LTM Referring Phys:  [id number] [place] Diagnosing Phys: Ida Rogue MD IMPRESSIONS  1.  Left ventricular ejection fraction, by estimation, is 60 to 65%. The left ventricle has normal function. The left ventricle has no regional wall motion abnormalities. There is mild left ventricular hypertrophy. Left ventricular diastolic parameters are consistent with Grade I diastolic dysfunction (impaired relaxation).  2. Right ventricular systolic function is normal. The right ventricular size is normal. There is mildly elevated pulmonary artery systolic pressure. FINDINGS  Left Ventricle: Left ventricular ejection fraction, by estimation, is 60 to 65%. The left ventricle has normal function. The left ventricle has no regional wall motion abnormalities. The left ventricular internal cavity size was normal in size. There is  mild left ventricular hypertrophy. Left ventricular diastolic parameters are consistent with Grade I diastolic dysfunction (impaired relaxation).  Right Ventricle: The right ventricular size is normal. No increase in right ventricular wall thickness. Right ventricular systolic function is normal. There is mildly elevated pulmonary artery systolic pressure. The tricuspid regurgitant velocity is 2.58  m/s, and with an assumed right atrial pressure of 5 mmHg, the estimated right ventricular systolic pressure is 08.6 mmHg. Left Atrium: Left atrial size was normal in size. Right Atrium: Right atrial size was normal in size. Pericardium: There is no evidence of pericardial effusion. Mitral Valve: The mitral valve was not well visualized. Normal mobility of the mitral valve leaflets. No evidence of mitral valve regurgitation. No evidence of mitral valve stenosis. Tricuspid Valve: The tricuspid valve is not well visualized. Tricuspid valve regurgitation is trivial. No evidence of tricuspid stenosis. Aortic Valve: The aortic valve was not well visualized. Aortic valve regurgitation is not visualized. Mild aortic valve sclerosis is present, with no evidence of aortic valve stenosis. Aortic valve mean gradient measures 10.0 mmHg. Aortic valve peak gradient measures 15.4 mmHg. Aortic valve area, by VTI measures 1.73 cm. Pulmonic Valve: The pulmonic valve was not well visualized. Pulmonic valve regurgitation is not visualized. No evidence of pulmonic stenosis. Aorta: The aortic root is normal in size and structure and the aortic root was not well visualized. Venous: The pulmonary veins were not well visualized. The inferior vena cava is normal in size with greater than 50% respiratory variability, suggesting right atrial pressure of 3 mmHg. IAS/Shunts: No atrial level shunt detected by color flow Doppler.  LEFT VENTRICLE PLAX 2D LVIDd:         4.07 cm  Diastology LVIDs:         3.36 cm  LV e' lateral:   5.87 cm/s LV PW:         1.46 cm  LV E/e' lateral: 11.1 LV IVS:        1.38 cm  LV e' medial:    5.66 cm/s LVOT diam:     1.90 cm  LV E/e' medial:  11.5 LV SV:         54  LV SV Index:   18 LVOT Area:     2.84 cm  RIGHT VENTRICLE RV S prime:     19.70 cm/s TAPSE (M-mode): 2.3 cm LEFT ATRIUM         Index LA diam:    3.90 cm 1.31 cm/m  AORTIC VALVE                    PULMONIC VALVE AV Area (Vmax):    1.27 cm     PV Vmax:       1.51 m/s AV Area (Vmean):   1.22 cm     PV Vmean:      90.000 cm/s AV Area (  VTI):     1.73 cm     PV VTI:        0.234 m AV Vmax:           196.00 cm/s  PV Peak grad:  9.1 mmHg AV Vmean:          145.000 cm/s PV Mean grad:  4.0 mmHg AV VTI:            0.316 m AV Peak Grad:      15.4 mmHg AV Mean Grad:      10.0 mmHg LVOT Vmax:         87.90 cm/s LVOT Vmean:        62.200 cm/s LVOT VTI:          0.192 m LVOT/AV VTI ratio: 0.61  AORTA Ao Root diam: 3.40 cm MITRAL VALVE                TRICUSPID VALVE MV Area (PHT): 3.06 cm     TR Peak grad:   26.6 mmHg MV E velocity: 65.00 cm/s   TR Vmax:        258.00 cm/s MV A velocity: 104.00 cm/s MV E/A ratio:  0.62         SHUNTS                             Systemic VTI:  0.19 m                             Systemic Diam: 1.90 cm Ida Rogue MD Electronically signed by Ida Rogue MD Signature Date/Time: 08/12/2019/1:27:53 PM    Final      Labs:   Basic Metabolic Panel: Recent Labs  Lab 08/13/19 0530 08/13/19 0530 08/14/19 0006 08/14/19 0006 08/15/19 0520 08/15/19 0520 08/16/19 0555 08/17/19 0408  NA 136  --  136  --  136  --  135 137  K 4.0   < > 3.6   < > 4.2   < > 4.1 3.9  CL 97*  --  94*  --  96*  --  95* 96*  CO2 29  --  32  --  31  --  32 32  GLUCOSE 175*  --  165*  --  182*  --  176* 202*  BUN 25*  --  30*  --  32*  --  28* 29*  CREATININE 0.87  --  1.01  --  0.80  --  0.72 0.70  CALCIUM 8.3*  --  8.4*  --  8.7*  --  8.8* 9.1  MG 2.1  --  2.3  --  2.4  --  2.4 2.3   < > = values in this interval not displayed.   GFR Estimated Creatinine Clearance: 157.7 mL/min (by C-G formula based on SCr of 0.7 mg/dL). Liver Function Tests: Recent Labs  Lab 08/11/19 1645  AST 22  ALT 18    ALKPHOS 68  BILITOT 0.9  PROT 6.7  ALBUMIN 3.5   No results for input(s): LIPASE, AMYLASE in the last 168 hours. No results for input(s): AMMONIA in the last 168 hours. Coagulation profile No results for input(s): INR, PROTIME in the last 168 hours.  CBC: Recent Labs  Lab 08/11/19 1645 08/12/19 0510 08/13/19 1547 08/14/19 0006 08/15/19 0520 08/16/19 0555 08/17/19 0408  WBC 11.8*   < > 10.5 8.8 8.2 8.5  9.3  NEUTROABS 9.3*  --   --   --   --   --   --   HGB 15.1   < > 14.3 13.4 12.6* 12.2* 12.1*  HCT 46.8   < > 45.0 41.6 39.9 38.7* 38.2*  MCV 87.3   < > 88.9 88.5 88.5 88.8 88.4  PLT 242   < > 259 232 235 234 243   < > = values in this interval not displayed.   Cardiac Enzymes: No results for input(s): CKTOTAL, CKMB, CKMBINDEX, TROPONINI in the last 168 hours. BNP: Invalid input(s): POCBNP CBG: Recent Labs  Lab 08/16/19 1201 08/16/19 1741 08/16/19 2137 08/17/19 0800 08/17/19 1148  GLUCAP 219* 212* 153* 218* 295*   D-Dimer No results for input(s): DDIMER in the last 72 hours. Hgb A1c No results for input(s): HGBA1C in the last 72 hours. Lipid Profile No results for input(s): CHOL, HDL, LDLCALC, TRIG, CHOLHDL, LDLDIRECT in the last 72 hours. Thyroid function studies Recent Labs    08/16/19 1558  TSH 1.062   Anemia work up Recent Labs    08/16/19 1559  VITAMINB12 206   Microbiology Recent Results (from the past 240 hour(s))  SARS CORONAVIRUS 2 (TAT 6-24 HRS) Nasopharyngeal Nasopharyngeal Swab     Status: None   Collection Time: 08/09/19 11:36 AM   Specimen: Nasopharyngeal Swab  Result Value Ref Range Status   SARS Coronavirus 2 NEGATIVE NEGATIVE Final    Comment: (NOTE) SARS-CoV-2 target nucleic acids are NOT DETECTED. The SARS-CoV-2 RNA is generally detectable in upper and lower respiratory specimens during the acute phase of infection. Negative results do not preclude SARS-CoV-2 infection, do not rule out co-infections with other pathogens, and  should not be used as the sole basis for treatment or other patient management decisions. Negative results must be combined with clinical observations, patient history, and epidemiological information. The expected result is Negative. Fact Sheet for Patients: SugarRoll.be Fact Sheet for Healthcare Providers: https://www.woods-mathews.com/ This test is not yet approved or cleared by the [place] FDA and  has been authorized for detection and/or diagnosis of SARS-CoV-2 by FDA under an Emergency Use Authorization (EUA). This EUA will remain  in effect (meaning this test can be used) for the duration of the COVID-19 declaration under Section 56 4(b)(1) of the Act, 21 U.S.C. section 360bbb-3(b)(1), unless the authorization is terminated or revoked sooner. Performed at [facility], [place] [address], [place], [place] [address]   Blood Culture (routine x 2)     Status: None   Collection Time: 08/11/19  7:43 PM   Specimen: BLOOD  Result Value Ref Range Status   Specimen Description BLOOD BLOOD LEFT FOREARM  Final   Special Requests   Final    BOTTLES DRAWN AEROBIC AND ANAEROBIC Blood Culture adequate volume   Culture   Final    NO GROWTH 5 DAYS Performed at [facility], [address], [place], [place] [address]    Report Status 08/16/2019 FINAL  Final  Blood Culture (routine x 2)     Status: None   Collection Time: 08/11/19  7:43 PM   Specimen: BLOOD  Result Value Ref Range Status   Specimen Description BLOOD LEFT ANTECUBITAL  Final   Special Requests   Final    BOTTLES DRAWN AEROBIC AND ANAEROBIC Blood Culture adequate volume   Culture   Final    NO GROWTH 5 DAYS Performed at [facility], [address], [place], [place] [address]  Report Status 08/16/2019 FINAL  Final  Respiratory Panel by RT PCR (Flu A&B, Covid) - Nasopharyngeal Swab     Status: None   Collection Time: 08/11/19  7:43 PM   Specimen:  Nasopharyngeal Swab  Result Value Ref Range Status   SARS Coronavirus 2 by RT PCR NEGATIVE NEGATIVE Final    Comment: (NOTE) SARS-CoV-2 target nucleic acids are NOT DETECTED. The SARS-CoV-2 RNA is generally detectable in upper respiratoy specimens during the acute phase of infection. The lowest concentration of SARS-CoV-2 viral copies this assay can detect is 131 copies/mL. A negative result does not preclude SARS-Cov-2 infection and should not be used as the sole basis for treatment or other patient management decisions. A negative result may occur with  improper specimen collection/handling, submission of specimen other than nasopharyngeal swab, presence of viral mutation(s) within the areas targeted by this assay, and inadequate number of viral copies (<131 copies/mL). A negative result must be combined with clinical observations, patient history, and epidemiological information. The expected result is Negative. Fact Sheet for Patients:  PinkCheek.be Fact Sheet for Healthcare Providers:  GravelBags.it This test is not yet ap proved or cleared by the [place] FDA and  has been authorized for detection and/or diagnosis of SARS-CoV-2 by FDA under an Emergency Use Authorization (EUA). This EUA will remain  in effect (meaning this test can be used) for the duration of the COVID-19 declaration under Section 564(b)(1) of the Act, 21 U.S.C. section 360bbb-3(b)(1), unless the authorization is terminated or revoked sooner.    Influenza A by PCR NEGATIVE NEGATIVE Final   Influenza B by PCR NEGATIVE NEGATIVE Final    Comment: (NOTE) The Xpert Xpress SARS-CoV-2/FLU/RSV assay is intended as an aid in  the diagnosis of influenza from Nasopharyngeal swab specimens and  should not be used as a sole basis for treatment. Nasal washings and  aspirates are unacceptable for Xpert Xpress SARS-CoV-2/FLU/RSV  testing. Fact Sheet for  Patients: PinkCheek.be Fact Sheet for Healthcare Providers: GravelBags.it This test is not yet approved or cleared by the [place] FDA and  has been authorized for detection and/or diagnosis of SARS-CoV-2 by  FDA under an Emergency Use Authorization (EUA). This EUA will remain  in effect (meaning this test can be used) for the duration of the  Covid-19 declaration under Section 564(b)(1) of the Act, 21  U.S.C. section 360bbb-3(b)(1), unless the authorization is  terminated or revoked. Performed at [facility], [address] [place], [place] [address]      Discharge Instructions:   Discharge Instructions    AMB referral to CHF clinic   Complete by: As directed    Diet - low sodium heart healthy   Complete by: As directed    Low sugar diet   Increase activity slowly   Complete by: As directed      Allergies as of 08/17/2019      Reactions   Penicillins Anaphylaxis, Rash, Other (See Comments)   Succinylcholine Chloride Anaphylaxis, Rash   Keflex [cephalexin] Rash   Sulfa Antibiotics Rash, Other (See Comments)      Medication List    STOP taking these medications   aspirin 81 MG tablet   enoxaparin 40 MG/0.4ML injection Commonly known as: LOVENOX     TAKE these medications   apixaban 5 MG Tabs tablet Commonly known as: ELIQUIS Take 2 tablets (10 mg total) by mouth 2 (two) times daily for 4 days.   apixaban 5 MG Tabs tablet Commonly known as: ELIQUIS Take  1 tablet (5 mg total) by mouth 2 (two) times daily. Start taking on: August 21, 2019   Basaglar KwikPen 100 UNIT/ML Inject 80 Units into the skin daily.   Bydureon 2 MG Pen Generic drug: Exenatide ER Inject 2 mg into the skin once a week.   carvedilol 12.5 MG tablet Commonly known as: COREG Take 1 tablet (12.5 mg total) by mouth 2 (two) times daily with a meal. What changed:   medication strength  how much to take   DULoxetine 30  MG capsule Commonly known as: CYMBALTA Take 30 mg by mouth daily.   furosemide 40 MG tablet Commonly known as: LASIX Take 1 tablet (40 mg total) by mouth daily. Start taking on: August 18, 2019   gabapentin 300 MG capsule Commonly known as: NEURONTIN Take 3 capsules (900 mg total) by mouth 3 (three) times daily.   glucose blood test strip 1 each by Other route 4 (four) times daily. Use with One touch meter to check blood sugar. Dx E11.9   Insulin Pen Needle 32G X 4 MM Misc Commonly known as: BD Pen Needle Nano U/F Inject 1 each as directed 3 (three) times daily.   losartan 100 MG tablet Commonly known as: COZAAR Take 1 tablet (100 mg total) by mouth daily.   Melatonin 10 MG Tabs Take 10 mg by mouth at bedtime as needed (sleep).   metFORMIN 500 MG tablet Commonly known as: GLUCOPHAGE Take 1,000 mg by mouth 2 (two) times daily with a meal.   multivitamin with minerals Tabs tablet Take 1 tablet by mouth daily.   omeprazole 20 MG capsule Commonly known as: PRILOSEC Take 20 mg by mouth daily.   ondansetron 4 MG tablet Commonly known as: Zofran Take 1 tablet (4 mg total) by mouth every 8 (eight) hours as needed for nausea or vomiting.   ONE TOUCH LANCETS Misc 1 each by Does not apply route 4 (four) times daily. Use with one touch meter to check blood sugar. Dx: E11.9   ONE TOUCH ULTRA SYSTEM KIT w/Device Kit 1 kit by Does not apply route once. Dx. E11.9   oxyCODONE 5 MG immediate release tablet Commonly known as: Oxy IR/ROXICODONE Take 1 tablet (5 mg total) by mouth every 4 (four) hours as needed.   rosuvastatin 20 MG tablet Commonly known as: CRESTOR Take 20 mg by mouth daily.   vitamin B-12 500 MCG tablet Commonly known as: CYANOCOBALAMIN Take 1 tablet (500 mcg total) by mouth daily. Start taking on: August 18, 2019         Time coordinating discharge: 32 minutes  Signed:  Jennye Boroughs  [facility] 08/17/2019, 12:06 PM

## 2019-08-17 NOTE — Progress Notes (Signed)
Subjective:  POD #10 s/p right shoulder rotator cuff repair.   Patient reports right shoulder pain as moderate.  Patient was moved to a new room overnight.  Patient states there were issues with getting a proper bed for him.  Patient is having some abdominal cramping but denies any distention.  He is passing flatus.  Patient was seen by the neurosurgery service this morning.  Patient is recommended for further work-up for his hydrocephalus as an outpatient in Dr. Rhea Bleacher office.  Objective:   VITALS:   Vitals:   08/16/19 2001 08/17/19 0531 08/17/19 0758 08/17/19 1150  BP: (!) 158/83 (!) 153/73 131/84 139/67  Pulse: 95 98 98 (!) 101  Resp:  16 18 19   Temp: 99.3 F (37.4 C) 98.2 F (36.8 C) 98.7 F (37.1 C) 98.5 F (36.9 C)  TempSrc: Oral  Oral Oral  SpO2: 100% 92% 98% 98%  Weight:      Height:        PHYSICAL EXAM: Right upper extremity Neurovascular intact Sensation intact distally Intact pulses distally Incision: no drainage No cellulitis present Compartment soft  LABS  Results for orders placed or performed during the hospital encounter of 08/11/19 (from the past 24 hour(s))  TSH     Status: None   Collection Time: 08/16/19  3:58 PM  Result Value Ref Range   TSH 1.062 0.350 - 4.500 uIU/mL  Sedimentation rate     Status: Abnormal   Collection Time: 08/16/19  3:58 PM  Result Value Ref Range   Sed Rate 77 (H) 0 - 20 mm/hr  RPR     Status: None   Collection Time: 08/16/19  3:58 PM  Result Value Ref Range   RPR Ser Ql NON REACTIVE NON REACTIVE  Vitamin B12     Status: None   Collection Time: 08/16/19  3:59 PM  Result Value Ref Range   Vitamin B-12 206 180 - 914 pg/mL  Glucose, capillary     Status: Abnormal   Collection Time: 08/16/19  5:41 PM  Result Value Ref Range   Glucose-Capillary 212 (H) 70 - 99 mg/dL  Glucose, capillary     Status: Abnormal   Collection Time: 08/16/19  9:37 PM  Result Value Ref Range   Glucose-Capillary 153 (H) 70 - 99 mg/dL   Basic metabolic panel     Status: Abnormal   Collection Time: 08/17/19  4:08 AM  Result Value Ref Range   Sodium 137 135 - 145 mmol/L   Potassium 3.9 3.5 - 5.1 mmol/L   Chloride 96 (L) 98 - 111 mmol/L   CO2 32 22 - 32 mmol/L   Glucose, Bld 202 (H) 70 - 99 mg/dL   BUN 29 (H) 8 - 23 mg/dL   Creatinine, Ser 0.70 0.61 - 1.24 mg/dL   Calcium 9.1 8.9 - 10.3 mg/dL   GFR calc non Af Amer >60 >60 mL/min   GFR calc Af Amer >60 >60 mL/min   Anion gap 9 5 - 15  CBC     Status: Abnormal   Collection Time: 08/17/19  4:08 AM  Result Value Ref Range   WBC 9.3 4.0 - 10.5 K/uL   RBC 4.32 4.22 - 5.81 MIL/uL   Hemoglobin 12.1 (L) 13.0 - 17.0 g/dL   HCT 38.2 (L) 39.0 - 52.0 %   MCV 88.4 80.0 - 100.0 fL   MCH 28.0 26.0 - 34.0 pg   MCHC 31.7 30.0 - 36.0 g/dL   RDW 15.5 11.5 - 15.5 %  Platelets 243 150 - 400 K/uL   nRBC 0.0 0.0 - 0.2 %  Magnesium     Status: None   Collection Time: 08/17/19  4:08 AM  Result Value Ref Range   Magnesium 2.3 1.7 - 2.4 mg/dL  Glucose, capillary     Status: Abnormal   Collection Time: 08/17/19  8:00 AM  Result Value Ref Range   Glucose-Capillary 218 (H) 70 - 99 mg/dL  Glucose, capillary     Status: Abnormal   Collection Time: 08/17/19 11:48 AM  Result Value Ref Range   Glucose-Capillary 295 (H) 70 - 99 mg/dL    EEG  Result Date: 06/21/8525 Thana Farr, MD     08/16/2019  6:19 PM ELECTROENCEPHALOGRAM REPORT Patient: Nathan Dean       Room #: 241A-AA EEG No. ID: [id number] Age: 64 y.o.        Sex: male Requesting Physician: Myriam Forehand Report Date:  08/16/2019       Interpreting Physician: Thana Farr History: Hristopher Missildine is an 64 y.o. male with syncope and altered mental status Medications: Eliquis, ASA, Coreg, Cymbalta, Lasix, Neurontin, Insulin, Crestor Conditions of Recording:  This is a 21 channel routine scalp EEG performed with bipolar and monopolar montages arranged in accordance to the international 10/20 system of electrode placement. One channel was  dedicated to EKG recording. The patient is in the awake state. Description:  The background activity is slow and poorly organized.  It consists of low voltage activity in the delta theta continuum.  This activity is diffusely distributed and continuous.  No epileptiform activity is noted. Hyperventilation was not performed.  Intermittent photic stimulation was performed but failed to illicit any change in the tracing. IMPRESSION: This is an abnormal EEG secondary to general background slowing.  This finding may be seen with a diffuse disturbance that is etiologically nonspecific, but may include a metabolic encephalopathy, among other possibilities.  No epileptiform activity is noted.  Thana Farr, MD Neurology [phone] 08/16/2019, 6:14 PM   CT HEAD WO CONTRAST  Result Date: 08/15/2019 CLINICAL DATA:  Near syncope during a bowel movement 4 days ago. EXAM: CT HEAD WITHOUT CONTRAST TECHNIQUE: Contiguous axial images were obtained from the base of the skull through the vertex without intravenous contrast. COMPARISON:  None. 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 No evidence of hemorrhage, infarct, mass lesion, midline shift or abnormal extra-axial fluid collection is identified. No pneumocephalus. Vascular: No hyperdense vessel or unexpected calcification. Skull: Intact.  No focal lesion. Sinuses/Orbits: Negative. Other: None. IMPRESSION: Findings compatible with communicating hydrocephalus. The exam is otherwise negative. Electronically Signed   By: Drusilla Kanner M.D.   On: 08/15/2019 14:58   MR BRAIN WO CONTRAST  Result Date: 08/16/2019 CLINICAL DATA:  Delirium, abnormal CT EXAM: MRI HEAD WITHOUT CONTRAST TECHNIQUE: Multiplanar, multiecho pulse sequences of the brain and surrounding structures were obtained without intravenous contrast. COMPARISON:  Correlation made with prior CT and cervical spine MRI from 2020 FINDINGS:  Motion artifact is present. Brain: There is no acute infarction or intracranial hemorrhage. There is enlargement of the ventricular system, particularly the lateral ventricles. There may been a similar degree of ventricular prominence on the 2020 cervical spine MRI, which minimally includes the posterior left lateral ventricle. Patchy and confluent areas of T2 hyperintensity in the supratentorial white matter are nonspecific but may reflect mild to moderate chronic microvascular ischemic changes. There may be a component of hydrocephalus related mild interstitial edema. Possible chronic left frontal  infarct with associated susceptibility reflecting chronic blood products. There is no intracranial mass or mass effect. There is no extra-axial fluid collection. Vascular: Major vessel flow voids at the skull base are preserved. Skull and upper cervical spine: Normal marrow signal is preserved. Sinuses/Orbits: Paranasal sinuses are aerated. Orbits are unremarkable. Other: Sella is unremarkable.  Mastoid air cells are clear. IMPRESSION: Partially motion degraded. No acute infarction, hemorrhage, or mass. Communicating hydrocephalus. A similar degree hydrocephalus may have been present on a 2020 cervical spine MRI. Mild to moderate chronic microvascular ischemic changes. 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 Electronically Signed   By: Guadlupe Spanish M.D.   On: 08/16/2019 10:57    Assessment/Plan:     Principal Problem:   Acute diastolic CHF (congestive heart failure) (HCC) Active Problems:   Acute cor pulmonale (HCC)   Acute metabolic encephalopathy   Acute pulmonary edema (HCC)   Edema  Patient is being discharged back to [facility] skilled nursing facility.  He will continue physical therapy therefore lower extremity strengthening as well as gentle passive range of motion of his right shoulder.  Patient should continue wearing the sling when out of bed.  He should avoid active forward elevation and  abduction of the right upper extremity.  Patient's wife explains that he will be going back to a rehab bed at [facility] which should be beneficial to him.  I informed them of the recommendations to follow-up with Dr. Myer Haff for further work-up of his hydrocephalus as an outpatient.  Patient will follow up with me in the office in 1 to 2 weeks following discharge.    Juanell Fairly , MD 08/17/2019, 1:56 PM

## 2019-08-21 DIAGNOSIS — I10 Essential (primary) hypertension: Secondary | ICD-10-CM

## 2019-08-21 DIAGNOSIS — I5032 Chronic diastolic (congestive) heart failure: Secondary | ICD-10-CM

## 2019-08-21 DIAGNOSIS — M75101 Unspecified rotator cuff tear or rupture of right shoulder, not specified as traumatic: Secondary | ICD-10-CM

## 2019-08-21 DIAGNOSIS — E1142 Type 2 diabetes mellitus with diabetic polyneuropathy: Secondary | ICD-10-CM

## 2019-08-21 DIAGNOSIS — I82441 Acute embolism and thrombosis of right tibial vein: Secondary | ICD-10-CM

## 2019-08-21 DIAGNOSIS — K219 Gastro-esophageal reflux disease without esophagitis: Secondary | ICD-10-CM

## 2019-08-24 DIAGNOSIS — F0390 Unspecified dementia without behavioral disturbance: Secondary | ICD-10-CM

## 2019-08-24 LAB — VITAMIN B1: Vitamin B1 (Thiamine): 191.9 nmol/L (ref 66.5–200.0)

## 2019-08-29 DIAGNOSIS — R002 Palpitations: Secondary | ICD-10-CM

## 2019-08-29 DIAGNOSIS — R231 Pallor: Secondary | ICD-10-CM

## 2019-08-29 DIAGNOSIS — R42 Dizziness and giddiness: Secondary | ICD-10-CM

## 2019-08-30 ENCOUNTER — Ambulatory Visit: Payer: PRIVATE HEALTH INSURANCE | Admitting: Family

## 2019-09-11 DIAGNOSIS — M71011 Abscess of bursa, right shoulder: Secondary | ICD-10-CM

## 2019-09-18 DIAGNOSIS — M6281 Muscle weakness (generalized): Secondary | ICD-10-CM

## 2019-09-18 DIAGNOSIS — I10 Essential (primary) hypertension: Secondary | ICD-10-CM

## 2019-09-18 DIAGNOSIS — E1142 Type 2 diabetes mellitus with diabetic polyneuropathy: Secondary | ICD-10-CM

## 2019-09-18 DIAGNOSIS — I82409 Acute embolism and thrombosis of unspecified deep veins of unspecified lower extremity: Secondary | ICD-10-CM

## 2019-09-18 DIAGNOSIS — I5032 Chronic diastolic (congestive) heart failure: Secondary | ICD-10-CM

## 2019-10-30 ENCOUNTER — Other Ambulatory Visit: Payer: Self-pay | Admitting: Internal Medicine

## 2019-11-02 ENCOUNTER — Other Ambulatory Visit: Payer: Self-pay | Admitting: Internal Medicine

## 2019-11-02 DIAGNOSIS — I10 Essential (primary) hypertension: Secondary | ICD-10-CM

## 2019-11-07 ENCOUNTER — Other Ambulatory Visit: Payer: Self-pay | Admitting: Internal Medicine

## 2019-11-16 ENCOUNTER — Other Ambulatory Visit: Payer: Self-pay | Admitting: Internal Medicine

## 2019-11-16 ENCOUNTER — Other Ambulatory Visit: Payer: Self-pay | Admitting: Orthopedic Surgery

## 2019-11-16 DIAGNOSIS — I10 Essential (primary) hypertension: Secondary | ICD-10-CM

## 2019-11-16 DIAGNOSIS — M25512 Pain in left shoulder: Secondary | ICD-10-CM

## 2019-11-21 ENCOUNTER — Other Ambulatory Visit: Payer: Self-pay | Admitting: Orthopedic Surgery

## 2019-11-21 DIAGNOSIS — M25511 Pain in right shoulder: Secondary | ICD-10-CM

## 2019-12-13 ENCOUNTER — Other Ambulatory Visit: Payer: Self-pay | Admitting: Internal Medicine

## 2019-12-21 ENCOUNTER — Ambulatory Visit
Admission: RE | Admit: 2019-12-21 | Discharge: 2019-12-21 | Disposition: A | Discharge status: Home / Self Care | Payer: Medicare Other | Source: Ambulatory Visit | Attending: Orthopedic Surgery | Admitting: Orthopedic Surgery

## 2019-12-21 ENCOUNTER — Other Ambulatory Visit: Payer: Self-pay

## 2019-12-21 DIAGNOSIS — M25511 Pain in right shoulder: Secondary | ICD-10-CM

## 2019-12-21 DIAGNOSIS — M25512 Pain in left shoulder: Secondary | ICD-10-CM

## 2020-01-18 ENCOUNTER — Other Ambulatory Visit: Payer: Self-pay

## 2020-01-18 ENCOUNTER — Emergency Department
Admission: EM | Admit: 2020-01-18 | Discharge: 2020-01-18 | Disposition: A | Discharge status: Home / Self Care | Payer: Medicare Other | Attending: Emergency Medicine | Admitting: Emergency Medicine

## 2020-01-18 ENCOUNTER — Emergency Department: Payer: Medicare Other

## 2020-01-18 DIAGNOSIS — S0990XA Unspecified injury of head, initial encounter: Secondary | ICD-10-CM | POA: Insufficient documentation

## 2020-01-18 DIAGNOSIS — R42 Dizziness and giddiness: Secondary | ICD-10-CM | POA: Diagnosis not present

## 2020-01-18 DIAGNOSIS — M25521 Pain in right elbow: Secondary | ICD-10-CM | POA: Diagnosis not present

## 2020-01-18 DIAGNOSIS — W19XXXA Unspecified fall, initial encounter: Secondary | ICD-10-CM | POA: Insufficient documentation

## 2020-01-18 DIAGNOSIS — Z5321 Procedure and treatment not carried out due to patient leaving prior to being seen by health care provider: Secondary | ICD-10-CM | POA: Insufficient documentation

## 2020-01-18 DIAGNOSIS — Y939 Activity, unspecified: Secondary | ICD-10-CM | POA: Diagnosis not present

## 2020-01-18 DIAGNOSIS — Y929 Unspecified place or not applicable: Secondary | ICD-10-CM | POA: Insufficient documentation

## 2020-01-18 DIAGNOSIS — Y999 Unspecified external cause status: Secondary | ICD-10-CM | POA: Diagnosis not present

## 2020-01-18 LAB — CBC
HCT: 42.2 % (ref 39.0–52.0)
Hemoglobin: 13.3 g/dL (ref 13.0–17.0)
MCH: 27.5 pg (ref 26.0–34.0)
MCHC: 31.5 g/dL (ref 30.0–36.0)
MCV: 87.2 fL (ref 80.0–100.0)
Platelets: 243 10*3/uL (ref 150–400)
RBC: 4.84 MIL/uL (ref 4.22–5.81)
RDW: 14.2 % (ref 11.5–15.5)
WBC: 11.4 10*3/uL — ABNORMAL HIGH (ref 4.0–10.5)
nRBC: 0 % (ref 0.0–0.2)

## 2020-01-18 LAB — BASIC METABOLIC PANEL
Anion gap: 10 (ref 5–15)
BUN: 26 mg/dL — ABNORMAL HIGH (ref 8–23)
CO2: 26 mmol/L (ref 22–32)
Calcium: 9.3 mg/dL (ref 8.9–10.3)
Chloride: 101 mmol/L (ref 98–111)
Creatinine, Ser: 0.82 mg/dL (ref 0.61–1.24)
GFR calc Af Amer: >60 mL/min (ref >60)
GFR calc non Af Amer: >60 mL/min (ref >60)
Glucose, Bld: 153 mg/dL — ABNORMAL HIGH (ref 70–99)
Potassium: 4 mmol/L (ref 3.5–5.1)
Sodium: 137 mmol/L (ref 135–145)

## 2020-01-18 MED ORDER — ACETAMINOPHEN 500 MG PO TABS
ORAL_TABLET | ORAL | Status: AC
  Filled 2020-01-18: qty 2

## 2020-01-18 MED ORDER — ACETAMINOPHEN 500 MG PO TABS
1000.0000 mg | ORAL_TABLET | Freq: Once | ORAL | Status: AC
  Administered 2020-01-18: 1000 mg via ORAL

## 2020-01-18 NOTE — ED Triage Notes (Signed)
PT to ED via EMS from home c/o fall and hitting head. PT has weak legs and fell backwards, hitting head on floor and has had headache and dizziness when getting up from floor. Red spot noted to back of head, no lac or abrasion.

## 2020-01-18 NOTE — ED Notes (Signed)
Pt  Reports right elbow pain visible swelling noted

## 2020-01-18 NOTE — ED Notes (Signed)
Pt notified he was leaving due to wait times.

## 2020-01-21 ENCOUNTER — Telehealth: Payer: Self-pay | Admitting: Emergency Medicine

## 2020-01-21 NOTE — Telephone Encounter (Addendum)
Called patient due to lwot to inquire about condition and follow up plans. Left message.  Wife did call me back and has started follow up.  She says his elbow does not seem to be bothering him.

## 2020-02-25 DIAGNOSIS — G910 Communicating hydrocephalus: Secondary | ICD-10-CM | POA: Insufficient documentation

## 2020-02-25 HISTORY — DX: Communicating hydrocephalus: G91.0

## 2020-02-26 DIAGNOSIS — M21371 Foot drop, right foot: Secondary | ICD-10-CM

## 2020-02-26 HISTORY — DX: Foot drop, right foot: M21.371

## 2020-02-29 ENCOUNTER — Other Ambulatory Visit: Payer: Self-pay | Admitting: Emergency Medicine

## 2020-02-29 DIAGNOSIS — I10 Essential (primary) hypertension: Secondary | ICD-10-CM

## 2020-02-29 NOTE — Telephone Encounter (Signed)
Requested medications are due for refill today yes  Requested medications are on the active medication list yes  Last refill 12/03/19   Notes to clinic Is this pt associated with your practice, no PCP listed, however, Dr Alvy Bimler wrote the Rx., please assess.

## 2020-03-07 DIAGNOSIS — I951 Orthostatic hypotension: Secondary | ICD-10-CM | POA: Insufficient documentation

## 2020-03-07 HISTORY — DX: Orthostatic hypotension: I95.1

## 2020-07-16 ENCOUNTER — Encounter: Payer: Self-pay | Admitting: Counselor

## 2020-08-01 DIAGNOSIS — N2889 Other specified disorders of kidney and ureter: Secondary | ICD-10-CM | POA: Insufficient documentation

## 2020-08-01 HISTORY — DX: Other specified disorders of kidney and ureter: N28.89

## 2020-09-13 ENCOUNTER — Encounter: Payer: Self-pay | Admitting: Physician Assistant

## 2020-09-13 ENCOUNTER — Other Ambulatory Visit: Payer: Self-pay

## 2020-09-13 ENCOUNTER — Emergency Department: Payer: Medicare Other

## 2020-09-13 ENCOUNTER — Emergency Department
Admission: EM | Admit: 2020-09-13 | Discharge: 2020-09-13 | Disposition: A | Discharge status: Home / Self Care | Payer: Medicare Other | Attending: Emergency Medicine | Admitting: Emergency Medicine

## 2020-09-13 DIAGNOSIS — Z79899 Other long term (current) drug therapy: Secondary | ICD-10-CM | POA: Insufficient documentation

## 2020-09-13 DIAGNOSIS — I110 Hypertensive heart disease with heart failure: Secondary | ICD-10-CM | POA: Insufficient documentation

## 2020-09-13 DIAGNOSIS — S0003XA Contusion of scalp, initial encounter: Secondary | ICD-10-CM | POA: Insufficient documentation

## 2020-09-13 DIAGNOSIS — S50311A Abrasion of right elbow, initial encounter: Secondary | ICD-10-CM

## 2020-09-13 DIAGNOSIS — I5031 Acute diastolic (congestive) heart failure: Secondary | ICD-10-CM | POA: Insufficient documentation

## 2020-09-13 DIAGNOSIS — E785 Hyperlipidemia, unspecified: Secondary | ICD-10-CM | POA: Diagnosis not present

## 2020-09-13 DIAGNOSIS — Z87891 Personal history of nicotine dependence: Secondary | ICD-10-CM | POA: Diagnosis not present

## 2020-09-13 DIAGNOSIS — Z794 Long term (current) use of insulin: Secondary | ICD-10-CM | POA: Diagnosis not present

## 2020-09-13 DIAGNOSIS — W19XXXA Unspecified fall, initial encounter: Secondary | ICD-10-CM

## 2020-09-13 DIAGNOSIS — W1839XA Other fall on same level, initial encounter: Secondary | ICD-10-CM | POA: Diagnosis not present

## 2020-09-13 DIAGNOSIS — E1140 Type 2 diabetes mellitus with diabetic neuropathy, unspecified: Secondary | ICD-10-CM | POA: Diagnosis not present

## 2020-09-13 DIAGNOSIS — E1169 Type 2 diabetes mellitus with other specified complication: Secondary | ICD-10-CM | POA: Diagnosis not present

## 2020-09-13 DIAGNOSIS — Z7984 Long term (current) use of oral hypoglycemic drugs: Secondary | ICD-10-CM | POA: Diagnosis not present

## 2020-09-13 DIAGNOSIS — S0990XA Unspecified injury of head, initial encounter: Secondary | ICD-10-CM | POA: Diagnosis present

## 2020-09-13 MED ORDER — IBUPROFEN 600 MG PO TABS
600.0000 mg | ORAL_TABLET | Freq: Once | ORAL | Status: AC
  Administered 2020-09-13: 600 mg via ORAL
  Filled 2020-09-13: qty 1

## 2020-09-13 MED ORDER — METOCLOPRAMIDE HCL 10 MG PO TABS
10.0000 mg | ORAL_TABLET | Freq: Once | ORAL | Status: AC
  Administered 2020-09-13: 10 mg via ORAL
  Filled 2020-09-13: qty 1

## 2020-09-13 MED ORDER — BACITRACIN-NEOMYCIN-POLYMYXIN 400-5-5000 EX OINT
TOPICAL_OINTMENT | Freq: Once | CUTANEOUS | Status: AC
  Administered 2020-09-13: 1 via TOPICAL
  Filled 2020-09-13: qty 1

## 2020-09-13 MED ORDER — METOCLOPRAMIDE HCL 5 MG PO TABS: 5.0000 mg | ORAL_TABLET | Freq: Three times a day (TID) | ORAL | 0 refills | Status: DC | PRN

## 2020-09-13 NOTE — Discharge Instructions (Addendum)
Your exam and CT scans are normal and reassuring following your fall. Take OTC ibuprofen as needed. Take the nausea medicine as needed. Follow-up with your provider or return to the ED as needed.

## 2020-09-13 NOTE — ED Notes (Signed)
Wounds cleansed and dressed.

## 2020-09-13 NOTE — ED Triage Notes (Signed)
Pt fell backwards from standing position on concrete. Pt with posterior skull hematoma, headache, no loc. Pt with abrasion noted to right posterior elbow. Pt denies neck pain.

## 2020-09-13 NOTE — ED Notes (Signed)
Ice provided for posterior scalp. Pt and spouse updated on delay for provider, verbalize understanding

## 2020-09-13 NOTE — ED Provider Notes (Signed)
[facility] Emergency Department Provider Note ____________________________________________  Time seen: 2214  I have reviewed the triage vital signs and the nursing notes.  HISTORY  Chief Complaint  Head Injury   HPI Nathan Dean is a 65 y.o. male with the below medical history, presents to the ED accompanied by his wife, for evaluation of injury sustained following a mechanical fall.   Patient lost his balance, from a standing position, and fell backwards on concrete.  He presents with a posterior scalp abrasion and small hematoma.  He also reports a mild headache but denies any LOC, vision loss, nausea, vomiting, or weakness.  He also has a abrasion to the right elbow.  Patient denies any significant neck pain, chest pain, shortness of breath, or other complaint at this time.  Past Medical History:  Diagnosis Date  . 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 Diabetes ([place])   . GERD (gastroesophageal reflux disease)   . Hyperlipidemia   . Hypertension   . Obesity   . Sleep apnea     Patient Active Problem List   Diagnosis Date Noted  . Acute diastolic CHF (congestive heart failure) ([place]) 08/17/2019  . Edema   . Acute metabolic encephalopathy 77/93/9030  . Acute pulmonary edema ([place]) 08/12/2019  . Acute cor pulmonale ([place]) 08/11/2019  . Oxygen desaturation 08/08/2019  . S/P right rotator cuff repair 08/07/2019  . Hypoxia 10/22/2018  . Infection of right prepatellar bursa 10/18/2018  . Sepsis ([place]) 10/18/2018  . Chest congestion 07/20/2017  . Viral illness 07/20/2017  . Lower respiratory infection 07/12/2017  . Laryngitis, acute 07/12/2017  . Cough 07/12/2017  . Pleurisy 07/12/2017  . Depression 06/03/2015  . HTN (hypertension) 01/02/2015  . Hyperlipidemia 01/02/2015  . 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 Sleep apnea 09/21/2010  . Arthritis, degenerative  02/17/2009  . Hereditary and idiopathic peripheral neuropathy 11/21/2008  . AD (atopic dermatitis) 07/08/2008  . Acid reflux 04/05/2007    Past Surgical History:  Procedure Laterality Date  . BACK SURGERY     in the 80's -herniated disc  . BACK SURGERY     L4 bone spur 1990  . CARPAL TUNNEL RELEASE Right 12/28/2016   Procedure: RIGHT ULNAR AND MEDIAN NEUROPLASTY AT WRIST;  Surgeon: Milly Jakob, MD;  Location: [facility];  Service: Orthopedics;  Laterality: Right;  . CARPAL TUNNEL RELEASE Left    25 years ago  . CATARACT EXTRACTION W/ INTRAOCULAR LENS IMPLANT Bilateral   . 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 SHOULDER OPEN ROTATOR CUFF REPAIR Right 08/07/2019   Procedure: ROTATOR CUFF REPAIR SHOULDER OPEN;  Surgeon: Thornton Park, MD;  Location: [facility];  Service: Orthopedics;  Laterality: Right;  . TONSILLECTOMY      Prior to Admission medications   Medication Sig Start Date End Date Taking? Authorizing Provider  metoCLOPramide (REGLAN) 5 MG tablet Take 1 tablet (5 mg total) by mouth every 8 (eight) hours as needed for up to 5 days for nausea or vomiting. 09/13/20 09/18/20 Yes Lazaro Isenhower, Dannielle Karvonen, PA-C  amLODipine (NORVASC) 10 MG tablet TAKE 0.5 TABLETS (5 MG TOTAL) BY MOUTH DAILY. 02/29/20   Horald Pollen, MD  Blood Glucose Monitoring Suppl (ONE TOUCH ULTRA SYSTEM KIT) w/Device KIT 1 kit by Does not apply route once. Dx. E11.9 07/28/15   Arlis Porta., MD  carvedilol (COREG) 12.5  MG tablet Take 1 tablet (12.5 mg total) by mouth 2 (two) times daily with a meal. 08/17/19   Jennye Boroughs, MD  DULoxetine (CYMBALTA) 30 MG capsule Take 30 mg by mouth daily. 07/24/18   [provider]  Exenatide ER (BYDUREON) 2 MG PEN Inject 2 mg into the skin once a week.    [provider]  furosemide (LASIX) 40 MG tablet Take 1 tablet (40 mg total) by mouth daily. 08/18/19   Jennye Boroughs, MD  gabapentin (NEURONTIN) 300 MG  capsule Take 3 capsules (900 mg total) by mouth 3 (three) times daily. 10/21/18   Wieting, Richard, MD  glucose blood test strip 1 each by Other route 4 (four) times daily. Use with One touch meter to check blood sugar. Dx E11.9 09/13/16   Mikey College, NP  Insulin Glargine [facility]) 100 UNIT/ML Inject 80 Units into the skin daily.    [provider]  Insulin Pen Needle (BD PEN NEEDLE NANO U/F) 32G X 4 MM MISC Inject 1 each as directed 3 (three) times daily. 04/18/18   Forrest Moron, MD  losartan (COZAAR) 100 MG tablet Take 1 tablet (100 mg total) by mouth daily. 05/01/19   Horald Pollen, MD  Melatonin 10 MG TABS Take 10 mg by mouth at bedtime as needed (sleep).    [provider]  metFORMIN (GLUCOPHAGE) 500 MG tablet Take 1,000 mg by mouth 2 (two) times daily with a meal.    [provider]  omeprazole (PRILOSEC) 20 MG capsule Take 20 mg by mouth daily.     [provider]  ondansetron (ZOFRAN) 4 MG tablet Take 1 tablet (4 mg total) by mouth every 8 (eight) hours as needed for nausea or vomiting. 08/07/19   Thornton Park, MD  ONE TOUCH LANCETS MISC 1 each by Does not apply route 4 (four) times daily. Use with one touch meter to check blood sugar. Dx: E11.9 09/16/16   Mikey College, NP  oxyCODONE (OXY IR/ROXICODONE) 5 MG immediate release tablet Take 1 tablet (5 mg total) by mouth every 4 (four) hours as needed. 08/07/19   Thornton Park, MD  rosuvastatin (CRESTOR) 20 MG tablet Take 20 mg by mouth daily.    [provider]    Allergies Penicillins, Succinylcholine chloride, Keflex [cephalexin], and Sulfa antibiotics  Family History  Problem Relation Age of Onset  . Ovarian cancer Mother   . Kidney failure Father   . Cancer Brother     Social History Social History   Tobacco Use  . Smoking status: Former Smoker    Packs/day: 2.00    Years: 20.00    Pack years: 40.00    Types: Cigarettes    Quit date:  06/28/1996    Years since quitting: 24.2  . Smokeless tobacco: Never Used  Vaping Use  . Vaping Use: Never used  Substance Use Topics  . Alcohol use: Not Currently    Alcohol/week: 0.0 standard drinks  . Drug use: No    Review of Systems  Constitutional: Negative for fever. Eyes: Negative for visual changes. ENT: Negative for sore throat. Cardiovascular: Negative for chest pain. Respiratory: Negative for shortness of breath. Gastrointestinal: Negative for abdominal pain, vomiting and diarrhea. Genitourinary: Negative for dysuria. Musculoskeletal: Negative for back pain. Skin: Negative for rash.  Elbow abrasion and scalp contusion as above. Neurological: Negative for headaches, focal weakness or numbness. ____________________________________________  PHYSICAL EXAM:  VITAL SIGNS: ED Triage Vitals  Enc Vitals Group  BP 09/13/20 2134 140/75     Pulse Rate 09/13/20 2134 77     Resp 09/13/20 2134 16     Temp --      Temp Source 09/13/20 2134 Oral     SpO2 09/13/20 2134 100 %     Weight 09/13/20 2135 (!) 350 lb (158.8 kg)     Height 09/13/20 2135 6' 9"  (2.057 m)     Head Circumference --      Peak Flow --      Pain Score 09/13/20 2135 6     Pain Loc --      Pain Edu? --      Excl. in [place]? --     Constitutional: Alert and oriented. Well appearing and in no distress. GCS = 15 Head: Normocephalic and atraumatic, except for small occipital abrasion to the posterior scalp.  No hematoma or lacerations noted. Eyes: Conjunctivae are normal. PERRL. Normal extraocular movements Neck: Supple. No thyromegaly. Cardiovascular: Normal rate, regular rhythm. Normal distal pulses. Respiratory: Normal respiratory effort. No wheezes/rales/rhonchi. Gastrointestinal: Soft and nontender. No distention. Musculoskeletal: Normal spinal alignment without midline tenderness, spasm Deformity, or step-off.  Nontender with normal range of motion in all extremities.  Neurologic: Cranial nerves were  grossly intact.  Normal gait without ataxia. Normal speech and language. No gross focal neurologic deficits are appreciated. Skin:  Skin is warm, dry and intact. No rash noted. Psychiatric: Mood and affect are normal. Patient exhibits appropriate insight and judgment. ____________________________________________   RADIOLOGY  CT Head w / o CM IMPRESSION: 1. Interval right-sided ventriculostomy catheter placement and positioning, as described above, with stable ventricular dilatation. 2. No acute intracranial abnormality.  Cervical Spine w/o CM IMPRESSION: 1. Multilevel degenerative changes, most prominent at the levels of C5-C6 and C6-C7. 2. No acute cervical spine fracture or subluxation. ____________________________________________  PROCEDURES  Ibuprofen 600 mg p.o. Reglan 10 mg p.o.  Procedures ____________________________________________   INITIAL IMPRESSION / ASSESSMENT AND PLAN / ED COURSE  As part of my medical decision making, I reviewed the following data within the electronic MEDICAL RECORD NUMBER Notes from prior ED visits      Patient ED evaluation of injury sustained following a mechanical fall.  Patient was evaluated for his head contusion to posterior scalp abrasion.  CT imaging of the neck and head were negative reassuring at this time for any acute findings.  Patient exam is overall benign reassuring no acute cerebellar ataxia is appreciated.  No other musculoskeletal injuries are reported.  Patient is reporting mild headache at this time but no significant vertigo or dizziness.  He will be discharged follow-up with primary provider for ongoing symptoms.  Return precautions have been reviewed.  Prescription for metoclopamide is provided for his benefit.    Nathan Dean was evaluated in Emergency Department on 09/16/2020 for the symptoms described in the history of present illness. He was evaluated in the context of the global COVID-19 pandemic, which necessitated  consideration that the patient might be at risk for infection with the SARS-CoV-2 virus that causes COVID-19. Institutional protocols and algorithms that pertain to the evaluation of patients at risk for COVID-19 are in a state of rapid change based on information released by regulatory bodies including the CDC and federal and state organizations. These policies and algorithms were followed during the patient's care in the ED. ____________________________________________  FINAL CLINICAL IMPRESSION(S) / ED DIAGNOSES  Final diagnoses:  Fall, initial encounter  Contusion of scalp, initial encounter  Abrasion of right elbow, initial  encounter      Melvenia Needles, PA-C 09/16/20 [id number]    Blake Divine, MD 09/18/20 [phone]

## 2020-09-13 NOTE — ED Notes (Signed)
Patient transported to CT 

## 2020-09-22 ENCOUNTER — Encounter: Payer: Self-pay | Admitting: Counselor

## 2020-09-22 ENCOUNTER — Other Ambulatory Visit: Payer: Self-pay

## 2020-09-22 ENCOUNTER — Ambulatory Visit: Payer: Medicare Other

## 2020-09-22 ENCOUNTER — Ambulatory Visit (INDEPENDENT_AMBULATORY_CARE_PROVIDER_SITE_OTHER): Payer: Medicare Other | Admitting: Counselor

## 2020-09-22 DIAGNOSIS — G912 (Idiopathic) normal pressure hydrocephalus: Secondary | ICD-10-CM

## 2020-09-22 DIAGNOSIS — R296 Repeated falls: Secondary | ICD-10-CM

## 2020-09-22 DIAGNOSIS — F09 Unspecified mental disorder due to known physiological condition: Secondary | ICD-10-CM

## 2020-09-22 DIAGNOSIS — Z982 Presence of cerebrospinal fluid drainage device: Secondary | ICD-10-CM

## 2020-09-22 NOTE — Progress Notes (Signed)
   Psychometrist Note   Cognitive testing was administered to Nathan Dean by Lamar Benes, B.S. (Technician) under the supervision of Alphonzo Severance, Psy.D., ABN. [name] was able to tolerate all test procedures. Dr. Nicole Kindred met with the patient as needed to manage any emotional reactions to the testing procedures. Rest breaks were offered.    The battery of tests administered was selected by Dr. Nicole Kindred with consideration to the patient's current level of functioning, the nature of his symptoms, emotional and behavioral responses during the interview, level of literacy, observed level of motivation/effort, and the nature of the referral question. This battery was communicated to the psychometrist. Communication between Dr. Nicole Kindred and the psychometrist was ongoing throughout the evaluation and Dr. Nicole Kindred was immediately accessible at all times. Dr. Nicole Kindred provided supervision to the technician on the date of this service, to the extent necessary to assure the quality of all services provided.    [name] will return in approximately one week for an interactive feedback session with Dr. Nicole Kindred, at which time test performance, clinical impressions, and treatment recommendations will be reviewed in detail. The patient understands he can contact our office should he require our assistance before this time.   A total of 90 minutes of billable time were spent with Nathan Dean by the technician, including test administration and scoring time. Billing for these services is reflected in Dr. Les Pou note.   This note reflects time spent with the psychometrician and does not include test scores, clinical history, or any interpretations made by Dr. Nicole Kindred. The full report will follow in a separate note.

## 2020-09-22 NOTE — Progress Notes (Addendum)
[facility]  Patient Name: Nathan Dean MRN: [id number] Date of Birth: March 06, 1956 Age: 65 y.o. Education: 14 years  Referral Circumstances and Background Information  Mr. Treyden Hakim is a 65 y.o., right-hand dominant, married man with a history of sleep apnea (on CPAP), T2DM, frequent falls, neuropathy, hydrocephalus s/p VP shunt (03/2020) and memory problems. He was referred by Dr. Manuella Ghazi at [facility] who is following him for neurological care.    On interview, the patient reported that he is having difficulties primarily with short term memory. He attributed this to falls, and estimated that he has had 30 or so falls over the past year, although only one of them has involved loss of consciousness (which was under a minute). He had has had several other falls where he has gotten dizzy or had an alteration in consciousness. He had shoulder surgery in March, 2021 and he was having a number of headaches, which prompted imaging and his hydrocephalus was discovered at that time. He consulted with a neurosurgeon who said it was "no big deal," but his symptoms worsened, and he eventually had a shunt placed in November, 2021. They reported that his cognitive problems have gotten better since the shunting but he continues to have issues. He has difficulties with word finding but he has no difficulties understanding the meaning of a single word, with making speech sounds, or with slow effortful speech. He rambles more than he used to in the past and he does repeat stories sometimes. He does not forget entire events. He does have problems with orientation to date/day of week, but not to month or year. He does not have difficulties with attention and concentration or with mental slowing. He has no hallucinations or delusions. With respect to mood, he was having a hard time previously although he has been doing better. He was very frustrated with his health status.  He also apparently has lingering issues about the fact that he is the last surviving member of his family. He is on bupropion and his wife stated that he is frustrated and gets angry when he is off of it. His energy is not the best, he feels tired a lot of the time. He is sleeping excessively, he reported he can sleep 18 hours of a 24 hour day. He will sleep 10 to 12 hours at night and then will nap throughout the day. He thinks he's bored. There are no major changes in appetite, he has lost about 17lbs.   The patient and his wife are not sure if there is anything that he used to do that he cannot do now as a result of memory and thinking problems. His wife has been "taking care of him for so long" it is hard to tell. Part of what she would like from the evaluation is guidance about what he can and cannot do. His wife does everything around the house, she does the finances, she also sorts his medications. She is responsible for scheduling all his doctor appointments and reminds him about them. The patient was driving but stopped a year ago, he hasn't been cleared to go back to driving yet (his hobby was going out for long drives). He has been good about following that recommendation and does not want to drive until he is safe. They are more worried about driving related to weakness in his legs and his shoulder problems than they are his cognition. He has no hobbies, he used to be  into trap shooting and hunt/fish although he stopped those things some time ago. 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 He does not cook. The patient reported that actually, he would like to go back to work, and thinks that he could probably do so if he could work virtually.   Past Medical History and Review of Relevant Studies   Patient Active Problem List   Diagnosis Date Noted  . Acute diastolic CHF (congestive heart failure) ([place]) 08/17/2019  . Edema   . Acute metabolic  encephalopathy 29/47/6546  . Acute pulmonary edema ([place]) 08/12/2019  . Acute cor pulmonale ([place]) 08/11/2019  . Oxygen desaturation 08/08/2019  . S/P right rotator cuff repair 08/07/2019  . Hypoxia 10/22/2018  . Infection of right prepatellar bursa 10/18/2018  . Sepsis ([place]) 10/18/2018  . Chest congestion 07/20/2017  . Viral illness 07/20/2017  . Lower respiratory infection 07/12/2017  . Laryngitis, acute 07/12/2017  . Cough 07/12/2017  . Pleurisy 07/12/2017  . Depression 06/03/2015  . HTN (hypertension) 01/02/2015  . Hyperlipidemia 01/02/2015  . 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 Sleep apnea 09/21/2010  . Arthritis, degenerative 02/17/2009  . Hereditary and idiopathic peripheral neuropathy 11/21/2008  . AD (atopic dermatitis) 07/08/2008  . Acid reflux 04/05/2007   Review of Neuroimaging and Relevant Medical History: The patient's most recent neuroimaging is a CT head from 09/13/2020 ordered after a fall. The study shows a moderate degree of ventricular dilatation, suggestive of ongoing hydrocephalus. The evans ratio is around .39, which is abnormal for his age. There is an area of left chronic frontal hypodensity suggestive of remote infarct (the area showed previous hemosiderin deposition on MRI, making it less likely transependymal edema). Right frontal approach ventriculostomy catheter terminates in the posterior right frontal horn. Mild cortical atrophy, difficult to assess given displacement of the parenchyma by CSF. His last MRI of the brain is 08/16/2019 shows similar findings, including an area of serpiginous blooming on SWI within the left frontal lobe in the region of his likely infarct.   The patient denied any history of seizures, clinically evident strokes, or other head injuries.    Patient had difficulties maintaining his 02 saturation, prompting an admission to the hospital in June, 2020.   Current Outpatient Medications   Medication Sig Dispense Refill  . amLODipine (NORVASC) 10 MG tablet TAKE 0.5 TABLETS (5 MG TOTAL) BY MOUTH DAILY. 45 tablet 3  . Blood Glucose Monitoring Suppl (ONE TOUCH ULTRA SYSTEM KIT) w/Device KIT 1 kit by Does not apply route once. Dx. E11.9 1 each 0  . carvedilol (COREG) 12.5 MG tablet Take 1 tablet (12.5 mg total) by mouth 2 (two) times daily with a meal.    . DULoxetine (CYMBALTA) 30 MG capsule Take 30 mg by mouth daily.    . Exenatide ER (BYDUREON) 2 MG PEN Inject 2 mg into the skin once a week.    . furosemide (LASIX) 40 MG tablet Take 1 tablet (40 mg total) by mouth daily.    Marland Kitchen gabapentin (NEURONTIN) 300 MG capsule Take 3 capsules (900 mg total) by mouth 3 (three) times daily.    Marland Kitchen glucose blood test strip 1 each by Other route 4 (four) times daily. Use with One touch meter to check blood sugar. Dx E11.9 100 each 12  . Insulin Glargine (BASAGLAR KWIKPEN) 100 UNIT/ML Inject 80 Units into the skin daily.    Marland Kitchen  Insulin Pen Needle (BD PEN NEEDLE NANO U/F) 32G X 4 MM MISC Inject 1 each as directed 3 (three) times daily. 300 each 3  . losartan (COZAAR) 100 MG tablet Take 1 tablet (100 mg total) by mouth daily. 30 tablet 0  . Melatonin 10 MG TABS Take 10 mg by mouth at bedtime as needed (sleep).    . metFORMIN (GLUCOPHAGE) 500 MG tablet Take 1,000 mg by mouth 2 (two) times daily with a meal.    . metoCLOPramide (REGLAN) 5 MG tablet Take 1 tablet (5 mg total) by mouth every 8 (eight) hours as needed for up to 5 days for nausea or vomiting. 15 tablet 0  . omeprazole (PRILOSEC) 20 MG capsule Take 20 mg by mouth daily.     . ondansetron (ZOFRAN) 4 MG tablet Take 1 tablet (4 mg total) by mouth every 8 (eight) hours as needed for nausea or vomiting. 30 tablet 0  . ONE TOUCH LANCETS MISC 1 each by Does not apply route 4 (four) times daily. Use with one touch meter to check blood sugar. Dx: E11.9 200 each 12  . oxyCODONE (OXY IR/ROXICODONE) 5 MG immediate release tablet Take 1 tablet (5 mg total) by  mouth every 4 (four) hours as needed. 40 tablet 0  . rosuvastatin (CRESTOR) 20 MG tablet Take 20 mg by mouth daily.     No current facility-administered medications for this visit.   Family History  Problem Relation Age of Onset  . Ovarian cancer Mother   . Kidney failure Father   . Cancer Brother    There is no  family history of dementia. His mother died in her 19s and his father was around 48. There is no  family history of psychiatric illness.  Psychosocial History  Developmental, Educational and Employment History: The patient reported that he lost his mother at 55 years and his father died when he was 23 and he still doesn't think he has gotten over those things. The patient reported that he was more interested in playing sports that he was school. He earned mainly C's, he thinks. He denied ever being held back or having any difficulties with learning however. He went to [employer] and got a degree in Pharmacologist from [employer] in [place]. 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 He was contracting in a number of positions but was unable to find a permanent job and he eventually retired in February, 2021.   Psychiatric History: The patient denied any prior history of depression.   Substance Use History: The patient does not drink alcohol, he used to smoke and did so for about 20 years but quit in 1998.   Relationship History and Living Cimcumstances: The patient and his wife Pamala Hurry have been married for 10 years. He was married for 5 years previously but it didn't work out (he was 31 when he got married for the first time). He has no children.   Mental Status and Behavioral Observations  Sensorium/Arousal: The patient's level of arousal was awake and alert. Hearing and vision were adequate for  testing purposes. Orientation: The patient was fully oriented to person, place, time, and situation.  Appearance: Dressed in appropriate, casual clothing with reasonable grooming and hygiene Behavior: The patient was pleasant and appropriate Speech/language: Normal in rate,  rhythm, volume, and prosody. No notable word finding errors noted.  Gait/Posture: The patient ambulated with a walker, had great difficulty arising from a seated position.  Movement: Postural/action tremor noted on the right Social Comportment: Appropriate within social norms Mood: "allright"  Affect: Mainly neutral Thought process/content: Thought process was logical and goal oriented. No delusions or hallucinations. Thought content was appropriate.  Safety: No thoughts of harming self or others on direct questioning.  Insight: [facility] Cognitive Assessment  09/22/2020  Visuospatial/ Executive (0/5) 2  Naming (0/3) 3  Attention: Read list of digits (0/2) 1  Attention: Read list of letters (0/1) 1  Attention: Serial 7 subtraction starting at 100 (0/3) 2  Language: Repeat phrase (0/2) 1  Language : Fluency (0/1) 0  Abstraction (0/2) 1  Delayed Recall (0/5) 0  Orientation (0/6) 6  Total 17  Adjusted Score (based on education) 17   Test Procedures  Wide Range Achievement Test - 4             Word Reading Neuropsychological Assessment Battery  List Learning  Story Learning  Daily Living Memory  Naming  Digit Span Repeatable Battery for the Assessment of Neuropsychological Status (Form A)  Figure Copy  Judgment of Line Orientation  Coding  Figure Recall The Dot Counting Test A Random Letter Test Controlled Oral Word Association (F-A-S) Semantic Fluency (Animals) Trail Making Test A & B Complex Ideational Material Modified [place] Card Sorting Test Geriatric Depression Scale - Short Form Quick Dementia Rating System (completed by wife Pamala Hurry)  Plan  Zavon Hyson was seen for a psychiatric  diagnostic evaluation and neuropsychological testing. He is a pleasant, 65 year old, right-hand dominant man with a history of recently discovered hydrocephalus treated with VP shunt in November, 2021. He realized some improvement in his cognition following the shunting and is also having less falls, but he does continue to have falls and some cognitive symptoms. His wife would like guidance as to what he can and cannot do so he can return to a greater level of independence than he enjoys at present. He is screening in the mild dementia range on the MoCA but presents as quite lucid in conversation and in fact would like to go back to work. Full and complete note with impressions, recommendations, and interpretation of test data to follow.   Viviano Simas Nicole Kindred, PsyD, [facility] Clinical Neuropsychologist  Informed Consent  Risks and benefits of the evaluation were discussed with the patient prior to all testing procedures. I conducted a clinical interview and neuropsychological testing (at least two tests) with [name] and Lamar Benes, B.S. (Technician) administered additional test procedures. The patient was able to tolerate the testing procedures and the patient (and/or family if applicable) is likely to benefit from further follow up to receive the diagnosis and treatment recommendations, which will be rendered at the next encounter.

## 2020-09-22 NOTE — Progress Notes (Signed)
NEUROPSYCHOLOGICAL TEST SCORES [facility]  Patient Name: Nathan Dean MRN: [id number] Date of Birth: 07-22-1955 Age: 65 y.o. Education: 14 years  Measurement properties of test scores: IQ, Index, and Standard Scores (SS): Mean = 100; Standard Deviation = 15 Scaled Scores (Ss): Mean = 10; Standard Deviation = 3 Z scores (Z): Mean = 0; Standard Deviation = 1 T scores (T); Mean = 50; Standard Deviation = 10  TEST SCORES:    Note: This summary of test scores accompanies the interpretive report and should not be interpreted by unqualified individuals or in isolation without reference to the report. Test scores are relative to age, gender, and educational history as available and appropriate.   Performance Validity        "A" Random Letter Test Raw  Descriptor      Errors 0 Within Expectation  The Dot Counting Test: 12 Within Expectation  NAB Effort Index 2 Within Expectation      Mental Status Screening     Total Score Descriptor  MoCA 17 Mild Dementia      Expected Functioning        Wide Range Achievement Test: Standard/Scaled Score Percentile      Word Reading 91 27      Reynolds Intellectual Screening Test Standard/T-score Percentile      Guess What 45 31      Odd Item Out 46 34  RIST Index 94 34      Attention/Processing Speed        Neuropsychological Assessment Battery (Attention Module, Form 1): T-score Percentile      Digits Forward 47 38      Digits Backwards 36 8      Repeatable Battery for the Assessment of Neuropsychological Status (Form A): Scaled Score Percentile      Coding 7 16      Language        Neuropsychological Assessment Battery (Language Module, Form 1): T-score Percentile      Naming   (31) 56 73      Verbal Fluency: T-score Percentile      Controlled Oral Word Association (F-A-S) 28 2      Semantic Fluency (Animals) 26 1      Memory:        Neuropsychological Assessment Battery (Memory Module, Form 1): T-score Percentile       List Learning           List A Immediate Recall   (3, 4, 7) 36 8         List B Immediate Recall   (3) 44 27         List A Short Delayed Recall   (0) 20 <1         List A Long Delayed Recall   (0) 27 1         List A Percent Retention   (0 %) --- <1         List A Long Delayed Yes/No Recognition Hits   (8) --- 3         List A Long Delayed Yes/No Recognition False Alarms   (6) --- 21         List A Recognition Discriminability Index --- 5      Story Learning           Immediate Recall   (11, 12) 25 1         Delayed Recall   (0) 31 3  Percent Retention   (0 %) --- <1      Daily Living Memory            Immediate Recall   (18, 14) 37 9          Delayed Recall   (7, 0) 30 2          Percent Retention (58 %) --- 2          Recognition Hits    (6) --- 2      Repeatable Battery for the Assessment of Neuropsychological Status (Form A): Scaled Score Percentile         Figure Recall   (12) 9 37      Visuospatial/Constructional Functioning        Repeatable Battery for the Assessment of Neuropsychological Status (Form A): Standard/Scaled Score Percentile     Visuospatial/Constructional Index 96 39         Figure Copy   (18) 10 50         Judgment of Line Orientation   (16) --- 26-50      Executive Functioning        Modified [employer] Test (MWCST): Standard/T-Score Percentile      Number of Categories Correct DC DC      Number of Perseverative Errors DC DC      Number of Total Errors DC DC      Percent Perseverative Errors DC DC  Executive Function Composite DC DC      Trail Making Test: T-Score Percentile      Part A 46 34      Part B 28 2      Boston Diagnostic Aphasia Exam: Raw Score Scaled Score      Complex Ideational Material 10 7      Clock Drawing Raw Score Descriptor      Command 8 Borderline Impairment      Rating Scales         Raw Score Descriptor  Beck Depression Inventory - II 13 Within Normal Limits  Beck Anxiety Inventory 11 Mild       Clinical Dementia Rating Raw Score Descriptor      Sum of Boxes 1.5 MCI      Global Score 0.5 MCI      Quick Dementia Rating System Raw Score Descriptor      Sum of Boxes 4.5 Mild Dementia      Total Score 9.5 Mild Dementia   Jerrol Helmers V. Roseanne Reno PsyD, ABN Clinical Neuropsychologist

## 2020-09-25 NOTE — Progress Notes (Signed)
NEUROPSYCHOLOGICAL EVALUATION [facility]  Patient Name: Mena Liby MRN: [id number] [id number] Date of Birth: 12/18/1955 Age: 65 y.o. Education: 14 years  Clinical Impressions  Wende MottGregory Bihl is a 65 y.o., right-hand dominant, married man with a history of sleep apnea (on CPAP), T2DM, and hydrocephalus s/p VP shunt (November, 2021). He was having very significant memory problems and frequent falls prior to his shunting. Now, he is still falling and having memory problems although they are somewhat improved. He and his wife appreciate some ongoing difficulties directing his thought process (he is "rambly" in conversations), forgetfulness, and he continues to fall periodically. He is significantly limited in his mobility and has been working with PT, which is slow going but is helping. His wife has taken over most things and would like guidance as to what he can and cannot do related to his cognitive status, so that he may enjoy a greater level of independence than he does at present. The patient actually feels as though he could go back to work and function adequately from a cognitive perspective, so long as he could do so remotely.   Neuropsychological assessment shows evidence of impairment in executive abilities, with low scores on measures primary to that domain and also in related areas such as working memory. He did have significant difficulty on verbal memory measures but it still retaining visual information well. My sense is that his memory problems are largely secondary to encoding and executive control issues, although a partial storage problem cannot be entirely excluded. He performed well on measures of visuospatial/constructional function and also on visual object confrontation naming. He screened in the normal range for depressive symptoms and in the mild range for anxiety symptoms, which is to some extent understandable given his recent health experiences. His wife characterizes him as  functioning at a mild dementia level, although I think that is an overestimate and there may be some confound from his significant physical limitations.   Mr. Mina MarbleSeibold is thus manifesting a neurocognitive disorder characterized by difficulties with executive functioning, working memory, and that is affecting his encoding and retention of information. The possibility of a primary developing storage problem cannot be entirely excluded but is felt to be much less likely. I think this is a mild neurocognitive disorder level problem as opposed to a dementia level problem, although he is probably functioning closer to a dementia level on the basis of his physical health status. Persisting cognitive sequelae of NPH present as the most likely cause of his difficulties. Recommend that he continue on in PT to address ongoing weakness and improve mobility. He may also benefit from healthy lifestyle changes, such as losing weight, eating a healthy diabetic diet, and the like. Ecological validity of neuropsychological assessment is limited for predicting specific outcomes, but my sense is that he can likely resume many of his prior activities with some supervision and assistance such as managing medications, being more involved in finances and the like. I am dubious about his ability to return to work from a cognitive perspective and defer to his medical treatment providers as to whether that is possible given his physical limitations. He may benefit from developing some hobbies and engaging in cognitively stimulating activities, such as reading, sudoku, crossword puzzles and the like. Defer to his ongoing treatment providers regarding return to driving (it seems that his physical issues and coordination are as much at issue as is his cognitive functioning).   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 You also had low scores on verbal memory measures, although my sense is that the memory problems may be in part or entirely due to executive issues, which often decrease spontaneous encoding of information and thereby result in "memory" problems. I think the best diagnosis at this point in time is Mild Neurocognitive Disorder.   Mild neurocognitive disorder is a term for memory and thinking problems that are detectable on testing and may be significant but are not enough to substantially undermine your capacity for independence day-to-day. This is with the caveat that you do have substantial functional limitations in some areas, but I think they are as much due to your physical issues as they are a cognitive problem. Your mild neurocognitive disorder is most likely related to your history of normal pressure hydrocephalus and shunting. Cognitive problems in hydrocephalus can improve with shunting, but they typically do not improve to baseline. If they have been present for a long time, they also may not improve much. Positively, treatment with a shunt does typically does tend to stop ones problems from worsening further.   Your wife stepped in to take over most instrumental activities of daily living and you are no longer doing things like managing the finances, organizing your medications, scheduling doctors appointments and the like. My guess is that with appropriate scaffolding (I.e., easing back into it and instruction about how to get started) you could at least be involved in these things if not take them over independently yourself. My best advice would be to make the transition gradually and put in place fail safes so that any  errors will be caught. This could include, for example, having your wife double check your medications and monitor them even if you are the one organizing them and taking them on your own. This would include the use of things like a calendar, either a paper calendar or even a shared calendar such as on [employer] for doctor appointments, so that your wife can notice if you are forgetting anything. External aids like this are also helpful for those with memory problems, because they minimize the memory demands of things like tracking appointments.   Although [place] medicine has been fascinated with the division between the mind and the body since the time of Descartes, in truth the two are not totally separable. This makes it crucially important for brain health to take care of your physical health. I therefore recommend that you continue to get moving in PT and adhere diligently to their recommendations. I would also recommend that you follow your medical providers advice regarding things such as maintaining a healthy weight, treating cerebrovascular risk factors, managing your diabetes and the like.   Countless studies have found a link between diabetes (particularly midlife diabetes) and dementia, with general estimates of about 1.5 - 2 times the general population risk of developing dementia for diabetic individuals. Individuals with poorly controlled diabetes in midlife are at particularly high risk with those having an HbA1c ? 6.5% at about a 3-fold risk and those with an HbA1c ? 7% at a 5-fold risk of developing dementia as compared with the general population. Work diligently to control your diabetes. Many of the  healthy lifestyle changes that can help manage blood sugar (things like exercise and diet) have also been shown to have beneficial effects on brain health.   Staying active mentally is crucially important. This can include formal types of brain training, such as cognitive rehabilitation, sudoku,  crossword puzzles and the like, although the best thing is to stay active and engaged in your life. This includes having meaningful hobbies and interests that you pursue and especially cultivating satisfying social relationships, which are a form of cognitive stimulation and have been shown to be protective in and of themselves.    In your case, I do not think your cognitive disorder needs to get worse, because it was due to a condition that was treated (hydrocephalus). My hope would be that you will continue to improve, slowly, as you regain your physical health status. You could of course return for repeat evaluation in 1 to 2 years if you feel that things are worsening over time and you find yourself in need of updated evaluation.   Test Findings  Test scores are summarized in additional documentation associated with this encounter. Test scores are relative to age, gender, and educational history as available and appropriate. There were no concerns about performance validity as all findings fell within normal expectations.   General Intellectual Functioning/Achievement:  Performance on single word reading was average and performance on the RIST index was also average, with comparable average range scores on its verbally and visually mediated subtests. Average therefore presents as a reasonable standard of comparison for the patient's cognitive test data.   Attention and Processing Efficiency: Indicators of attention and working memory involving digit repetition showed average digit repetition forward and unusually low digit repetition backward. This suggests some limitation in working memory. Performance on processing speed assessment was low average for timed number-symbol coding but average for simple numeric sequencing (the latter is a very easy task).   Language: Performance on language measures showed intact visual object confrontation naming. Generation of words on sensitive fluency indicators  was extremely low, which may be on the basis of executive control and processing speed problems.   Visuospatial Function: Indicators of visuospatial and constructional abilities generated an average range score on the overall index with comparable performance on constructional and perceptual indicators.   Learning and Memory: Performance was low on verbally mediated measures of learning and memory. The profile shows weak memory encoding and limited retention of information across time. He did much better with visual information. While the possibility of a developing or primary memory storage problem cannot be entirely excluded, in his case, given other test scores I think it more likely that this is secondary to significant executive and encoding issues.   In the verbal realm, Mr. Cheema learned 3, 4, and 7 words of a 12-item list across three learning trials followed by 0 words at short and long delayed recall, which is a demonstration of unusually low immediate recall and extremely low delayed recall. His performance was unusually low for discriminability when provided with recognition cues, although virtually all of his false positives were from a distractor alternate list, suggesting this may be to some extent on the basis of source memory problems. Memory for a short story was extremely low on immediate recall and he did not recall any details on delayed recall. Memory for brief daily living type information was low average on immediate recall and in this case, he did recall some information but his delayed recall was still unusually low.  Recognition for the information vs. False choices was extremely low.   In the visual realm, he did much better, with average delayed recall of visual information.   Executive Functions: Performance across the test battery suggested significant difficulties with executive functioning, most notably with respect to working memory, cognitive flexibility, and  ideational generativity. He discontinued the Modified [employer] after running into difficulties, which likely reflects problems in its underlying task demands although the instrument could not be scored. Alternating sequencing of numbers and letters of the alphabet was extremely low. Generation of words in response to letters and semantic prompts was extremely low. Clock drawing was suggestive of "borderline"impairment with a sloppy quality to the drawing and incorrect size difference between the hands. Performance was low average when reasoning with complex verbal information.   Rating Scale(s): Mr. Andringa reported mild levels of anxiety symptoms, which seem understandable in response to his recent health issues. His level of self-reported depressive problems was within normal limits. His wife characterized him as functioning at a mild dementia level, although I think there is substantial confound from his lack of engagement in activities and physical limitations and that he may be at more of an MCI level problem. I rated a CDR for him and his Sum of Boxes score is a 1.5, which is MCI.   Bettye Boeck Roseanne Reno, PsyD, ABN Clinical Neuropsychologist  Coding and Compliance  Billing below reflects technician time, my direct face-to-face time with the patient, time spent in test administration, and time spent in professional activities including but not limited to: neuropsychological test interpretation, integration of neuropsychological test data with clinical history, report preparation, treatment planning, care coordination, and review of diagnostically pertinent medical history or studies.   Services associated with this encounter: Clinical Interview [phone]) plus 150 minutes (96132/96133; Neuropsychological Evaluation by Professional)  25 minutes (96136/96137; Test Administration by Professional) 90 minutes (96138/96139; Neuropsychological Testing by Technician)

## 2020-09-29 ENCOUNTER — Ambulatory Visit (INDEPENDENT_AMBULATORY_CARE_PROVIDER_SITE_OTHER): Payer: Medicare Other | Admitting: Counselor

## 2020-09-29 ENCOUNTER — Other Ambulatory Visit: Payer: Self-pay

## 2020-09-29 ENCOUNTER — Encounter: Payer: Self-pay | Admitting: Counselor

## 2020-09-29 DIAGNOSIS — F0670 Mild neurocognitive disorder due to known physiological condition without behavioral disturbance: Secondary | ICD-10-CM

## 2020-09-29 DIAGNOSIS — G3184 Mild cognitive impairment, so stated: Secondary | ICD-10-CM | POA: Diagnosis not present

## 2020-09-29 NOTE — Patient Instructions (Signed)
Your performance and presentation on assessment were consistent with difficulties on measures of executive function, which involve things like the ability to switch rapidly from one thing to the next, generate ideas, and also hold information in mind. You also had low scores on verbal memory measures, although my sense is that the memory problems may be in part or entirely due to executive issues, which often decrease spontaneous encoding of information and thereby result in "memory" problems. I think the best diagnosis at this point in time is Mild Neurocognitive Disorder.   Mild neurocognitive disorder is a term for memory and thinking problems that are detectable on testing and may be significant but are not enough to substantially undermine your capacity for independence day-to-day. This is with the caveat that you do have substantial functional limitations in some areas, but I think they are as much due to your physical issues as they are a cognitive problem. Your mild neurocognitive disorder is most likely related to your history of normal pressure hydrocephalus and shunting. Cognitive problems in hydrocephalus can improve with shunting, but they typically do not improve to baseline. If they have been present for a long time, they also may not improve much. Positively, treatment with a shunt does typically does tend to stop ones problems from worsening further.   Your wife stepped in to take over most instrumental activities of daily living and you are no longer doing things like managing the finances, organizing your medications, scheduling doctors appointments and the like. My guess is that with appropriate scaffolding (I.e., easing back into it and instruction about how to get started) you could at least be involved in these things if not take them over independently yourself. My best advice would be to make the transition gradually and put in place fail safes so that any errors will be caught. This  could include, for example, having your wife double check your medications and monitor them even if you are the one organizing them and taking them on your own. This would include the use of things like a calendar, either a paper calendar or even a shared calendar such as on [employer] for doctor appointments, so that your wife can notice if you are forgetting anything. External aids like this are also helpful for those with memory problems, because they minimize the memory demands of things like tracking appointments.   Although [place] medicine has been fascinated with the division between the mind and the body since the time of Descartes, in truth the two are not totally separable. This makes it crucially important for brain health to take care of your physical health. I therefore recommend that you continue to get moving in PT and adhere diligently to their recommendations. I would also recommend that you follow your medical providers advice regarding things such as maintaining a healthy weight, treating cerebrovascular risk factors, managing your diabetes and the like.   Countless studies have found a link between diabetes (particularly midlife diabetes) and dementia, with general estimates of about 1.5 - 2 times the general population risk of developing dementia for diabetic individuals. Individuals with poorly controlled diabetes in midlife are at particularly high risk with those having an HbA1c ? 6.5% at about a 3-fold risk and those with an HbA1c ? 7% at a 5-fold risk of developing dementia as compared with the general population. Work diligently to control your diabetes. Many of the healthy lifestyle changes that can help manage blood sugar (things like exercise and diet) have also  been shown to have beneficial effects on brain health.   Staying active mentally is crucially important. This can include formal types of brain training, such as cognitive rehabilitation, sudoku, crossword puzzles and the  like, although the best thing is to stay active and engaged in your life. This includes having meaningful hobbies and interests that you pursue and especially cultivating satisfying social relationships, which are a form of cognitive stimulation and have been shown to be protective in and of themselves.    In your case, I do not think your cognitive disorder needs to get worse, because it was due to a condition that was treated (hydrocephalus). My hope would be that you will continue to improve, slowly, as you regain your physical health status. You could of course return for repeat evaluation in 1 to 2 years if you feel that things are worsening over time and you find yourself in need of updated evaluation.

## 2020-09-29 NOTE — Progress Notes (Signed)
NEUROPSYCHOLOGY FEEDBACK NOTE [facility]  Feedback Note: I met with Nathan Dean to review the findings resulting from his neuropsychological evaluation. Since the last appointment, he has been about the same.Time was spent reviewing the impressions and recommendations that are detailed in the evaluation report. We discussed impression of likely mild neurocognitive disorder, as reflected in the patient instructions. I counseled them on returning to more activities and slowly taking things back over, one step at a time, and with supervision. I was candid that I do not think there are major cognitive contraindications to most activities, although it may be challenging for him to return to work. Suggested that he continue to strengthen himself, lose weight, and work on increasing mobility. He may benefit from another course of PT (was having too much difficulty to really participate appropriately last time). I took time to explain the findings and answer all the patient's questions. I encouraged Nathan Dean to contact me should he have any further questions or if further follow up is desired.   Current Medications and Medical History   Current Outpatient Medications  Medication Sig Dispense Refill  . amLODipine (NORVASC) 10 MG tablet TAKE 0.5 TABLETS (5 MG TOTAL) BY MOUTH DAILY. 45 tablet 3  . Blood Glucose Monitoring Suppl (ONE TOUCH ULTRA SYSTEM KIT) w/Device KIT 1 kit by Does not apply route once. Dx. E11.9 1 each 0  . carvedilol (COREG) 12.5 MG tablet Take 1 tablet (12.5 mg total) by mouth 2 (two) times daily with a meal.    . DULoxetine (CYMBALTA) 30 MG capsule Take 30 mg by mouth daily.    . Exenatide ER (BYDUREON) 2 MG PEN Inject 2 mg into the skin once a week.    . furosemide (LASIX) 40 MG tablet Take 1 tablet (40 mg total) by mouth daily.    Marland Kitchen gabapentin (NEURONTIN) 300 MG capsule Take 3 capsules (900 mg total) by mouth 3 (three) times daily.    Marland Kitchen glucose blood test strip 1 each  by Other route 4 (four) times daily. Use with One touch meter to check blood sugar. Dx E11.9 100 each 12  . Insulin Glargine (BASAGLAR KWIKPEN) 100 UNIT/ML Inject 80 Units into the skin daily.    . Insulin Pen Needle (BD PEN NEEDLE NANO U/F) 32G X 4 MM MISC Inject 1 each as directed 3 (three) times daily. 300 each 3  . losartan (COZAAR) 100 MG tablet Take 1 tablet (100 mg total) by mouth daily. 30 tablet 0  . Melatonin 10 MG TABS Take 10 mg by mouth at bedtime as needed (sleep).    . metFORMIN (GLUCOPHAGE) 500 MG tablet Take 1,000 mg by mouth 2 (two) times daily with a meal.    . metoCLOPramide (REGLAN) 5 MG tablet Take 1 tablet (5 mg total) by mouth every 8 (eight) hours as needed for up to 5 days for nausea or vomiting. 15 tablet 0  . omeprazole (PRILOSEC) 20 MG capsule Take 20 mg by mouth daily.     . ondansetron (ZOFRAN) 4 MG tablet Take 1 tablet (4 mg total) by mouth every 8 (eight) hours as needed for nausea or vomiting. 30 tablet 0  . ONE TOUCH LANCETS MISC 1 each by Does not apply route 4 (four) times daily. Use with one touch meter to check blood sugar. Dx: E11.9 200 each 12  . oxyCODONE (OXY IR/ROXICODONE) 5 MG immediate release tablet Take 1 tablet (5 mg total) by mouth every 4 (four) hours as needed. Simpson  tablet 0  . rosuvastatin (CRESTOR) 20 MG tablet Take 20 mg by mouth daily.     No current facility-administered medications for this visit.    Patient Active Problem List   Diagnosis Date Noted  . Acute diastolic CHF (congestive heart failure) ([place]) 08/17/2019  . Edema   . Acute metabolic encephalopathy 25/42/7062  . Acute pulmonary edema ([place]) 08/12/2019  . Acute cor pulmonale ([place]) 08/11/2019  . Oxygen desaturation 08/08/2019  . S/P right rotator cuff repair 08/07/2019  . Hypoxia 10/22/2018  . Infection of right prepatellar bursa 10/18/2018  . Sepsis ([place]) 10/18/2018  . Chest congestion 07/20/2017  . Viral illness 07/20/2017  . Lower respiratory infection 07/12/2017  .  Laryngitis, acute 07/12/2017  . Cough 07/12/2017  . Pleurisy 07/12/2017  . Depression 06/03/2015  . HTN (hypertension) 01/02/2015  . Hyperlipidemia 01/02/2015  . 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 Sleep apnea 09/21/2010  . Arthritis, degenerative 02/17/2009  . Hereditary and idiopathic peripheral neuropathy 11/21/2008  . AD (atopic dermatitis) 07/08/2008  . Acid reflux 04/05/2007    Mental Status and Behavioral Observations  Nathan Dean presented on time to the present encounter and was alert and generally oriented. Speech was normal in rate, rhythm, volume, and prosody. Self-reported mood was "good" and affect was euthymic. Thought process was logical and goal oriented and thought content was appropriate to the topics discussed. There were no safety concerns identified at today's encounter, such as thoughts of harming self or others.   Plan  Feedback provided regarding the patient's neuropsychological evaluation. 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 Recommended that he increase his level of physical activity and consider another course of PT to increase mobility. They will continue to monitor his symptoms and know to follow up with medical care providers re. His shunt needs. Nathan Dean was encouraged to contact me if any questions arise or if further follow up is desired.   Nathan Dean Nicole Kindred, PsyD, ABN Clinical Neuropsychologist  Service(s) Provided at This Encounter: 32 minutes [phone]; Conjoint therapy with patient present)

## 2020-10-08 DIAGNOSIS — K862 Cyst of pancreas: Secondary | ICD-10-CM

## 2020-10-08 DIAGNOSIS — R935 Abnormal findings on diagnostic imaging of other abdominal regions, including retroperitoneum: Secondary | ICD-10-CM | POA: Insufficient documentation

## 2020-10-08 HISTORY — DX: Cyst of pancreas: K86.2

## 2020-10-08 HISTORY — DX: Abnormal findings on diagnostic imaging of other abdominal regions, including retroperitoneum: R93.5

## 2021-01-09 DIAGNOSIS — I350 Nonrheumatic aortic (valve) stenosis: Secondary | ICD-10-CM

## 2021-01-09 DIAGNOSIS — Z86718 Personal history of other venous thrombosis and embolism: Secondary | ICD-10-CM

## 2021-01-09 HISTORY — DX: Personal history of other venous thrombosis and embolism: Z86.718

## 2021-01-09 HISTORY — DX: Nonrheumatic aortic (valve) stenosis: I35.0

## 2021-02-24 ENCOUNTER — Ambulatory Visit: Payer: Medicare Other | Admitting: Podiatry

## 2021-02-24 ENCOUNTER — Ambulatory Visit: Payer: Medicare Other | Attending: Student

## 2021-02-24 ENCOUNTER — Other Ambulatory Visit: Payer: Self-pay

## 2021-02-24 ENCOUNTER — Ambulatory Visit: Payer: Managed Care, Other (non HMO) | Admitting: Podiatry

## 2021-02-24 DIAGNOSIS — R262 Difficulty in walking, not elsewhere classified: Secondary | ICD-10-CM | POA: Insufficient documentation

## 2021-02-24 DIAGNOSIS — R2689 Other abnormalities of gait and mobility: Secondary | ICD-10-CM | POA: Diagnosis present

## 2021-02-24 DIAGNOSIS — R278 Other lack of coordination: Secondary | ICD-10-CM | POA: Insufficient documentation

## 2021-02-24 DIAGNOSIS — M6281 Muscle weakness (generalized): Secondary | ICD-10-CM | POA: Diagnosis present

## 2021-02-24 DIAGNOSIS — R269 Unspecified abnormalities of gait and mobility: Secondary | ICD-10-CM | POA: Diagnosis present

## 2021-02-24 DIAGNOSIS — R2681 Unsteadiness on feet: Secondary | ICD-10-CM | POA: Diagnosis not present

## 2021-02-24 NOTE — Therapy (Signed)
[facility] MAIN [facility] SERVICES [address] [place], [place], [address] Phone: [phone]   Fax:  [phone]  Physical Therapy Evaluation  Patient Details  Name: Nathan Dean MRN: [id number] Date of Birth: 12/03/1955 Referring Provider (PT): Vernard Gambles [place]  Encounter Date: 02/24/2021   PT End of Session - 02/24/21 1617     Visit Number 1    Number of Visits 24    Date for PT Re-Evaluation 05/19/21    Authorization Type 1/10 eval 02/24/21    PT Start Time 1101    PT Stop Time 1156    PT Time Calculation (min) 55 min    Equipment Utilized During Treatment Gait belt    Activity Tolerance Patient tolerated treatment well;Patient limited by fatigue    Behavior During Therapy WFL for tasks assessed/performed             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    Diabetes (HCC)    GERD (gastroesophageal reflux disease)    Hyperlipidemia    Hypertension    Obesity    Sleep apnea     Past Surgical History:  Procedure Laterality Date   BACK SURGERY     in the 80's -herniated disc   BACK SURGERY     L4 bone spur 1990   CARPAL TUNNEL RELEASE Right 12/28/2016   Procedure: RIGHT ULNAR AND MEDIAN NEUROPLASTY AT WRIST;  Surgeon: Mack Hook, MD;  Location:  SURGERY CENTER;  Service: Orthopedics;  Laterality: Right;   CARPAL TUNNEL RELEASE Left    25 years ago   CATARACT EXTRACTION W/ INTRAOCULAR LENS IMPLANT Bilateral    REPLACEMENT TOTAL KNEE BILATERAL  2014   left 2012 rt 2014   SHOULDER OPEN ROTATOR CUFF REPAIR Right 08/07/2019   Procedure: ROTATOR CUFF REPAIR SHOULDER OPEN;  Surgeon: Juanell Fairly, MD;  Location: [facility] ORS;  Service: Orthopedics;  Laterality: Right;   TONSILLECTOMY      There were no vitals filed for this visit.    Subjective Assessment - 02/24/21 1111     Subjective Patient is returning to physical therapy for  balance and strengthening.    Pertinent History Patient has seen this therapist in 2019. Was being treated at [facility] clinic but is transferring to this clinic for balance training. PMH includes sleep apnea, HTN, aortic valve disease, GERD, genituourinary malignancy; renal, hydrocephalus s/p shunt placed 03/2020, DVT, Type II DM, obesity, OA, covid, depression, diabetic polyneuropathy, AD, pressure injury of right foot, and falls. Patient had R rotator cuff surgery a year and a half and has not had a good recovery process.  Patient has a history of falling prior to COVID, falls decreased after shunt but still are present. Will see a podiatrist soon for his pressure injury on R foot. Referral is for diabetic polyneuropathy, foot drop R foot, and falling. Patient reports >30 falls in past 6 months.    Limitations Sitting;Standing;Walking;House hold activities;Other (comment)    How long can you sit comfortably? a few hours    How long can you stand comfortably? immediately unstable    How long can you walk comfortably? immediately unstable    Patient Stated Goals improve leg strength. more independent    Currently in Pain? Yes    Pain Score 6     Pain Location Shoulder    Pain Orientation Right    Pain Descriptors / Indicators Aching    Pain  Type Chronic pain;Surgical pain    Pain Onset More than a month ago    Pain Frequency Intermittent    Aggravating Factors  weightbearing, moving    Pain Relieving Factors rest                [facility] PT Assessment - 02/24/21 0001       Assessment   Medical Diagnosis gait instability/neuropathy    Referring Provider (PT) Vernard Gambles PA    Onset Date/Surgical Date --   2021   Hand Dominance Right    Prior Therapy yes      Precautions   Precautions Fall    Required Braces or Orthoses Other Brace/Splint   AFO   Other Brace/Splint R AFO      Restrictions   Weight Bearing Restrictions No      Balance Screen   Has the patient fallen in the  past 6 months Yes    How many times? 30    Has the patient had a decrease in activity level because of a fear of falling?  Yes    Is the patient reluctant to leave their home because of a fear of falling?  Yes      Home Environment   Living Environment Private residence    Living Arrangements Spouse/significant other    Available Help at Discharge Family    Type of Home House    Home Access Stairs to enter    Entrance Stairs-Number of Steps 3    Entrance Stairs-Rails Right;Left;Can reach both    Home Layout One level    Home Equipment Cane - quad;Walker - standard;Toilet riser      Prior Function   Level of Independence Independent with basic ADLs    Vocation Retired    Leisure Scientist, physiological, play computer games, foster family for kittens      Cognition   Overall Cognitive Status Within Functional Limits for tasks assessed      Observation/Other Assessments   Focus on Therapeutic Outcomes (FOTO)  41%      Standardized Balance Assessment   Standardized Balance Assessment Berg Balance Test      Berg Balance Test   Sit to Stand Able to stand using hands after several tries    Standing Unsupported Needs several tries to stand 30 seconds unsupported    Sitting with Back Unsupported but Feet Supported on Floor or Stool Able to sit safely and securely 2 minutes    Stand to Sit Uses backs of legs against chair to control descent    Transfers Able to transfer with verbal cueing and /or supervision    Standing Unsupported with Eyes Closed Unable to keep eyes closed 3 seconds but stays steady    Standing Unsupported with Feet Together Needs help to attain position and unable to hold for 15 seconds    From Standing, Reach Forward with Outstretched Arm Reaches forward but needs supervision    From Standing Position, Pick up Object from Floor Unable to try/needs assist to keep balance    From Standing Position, Turn to Look Behind Over each Shoulder Needs supervision when turning    Turn 360  Degrees Needs assistance while turning    Standing Unsupported, Alternately Place Feet on Step/Stool Needs assistance to keep from falling or unable to try    Standing Unsupported, One Foot in Front Needs help to step but can hold 15 seconds    Standing on One Leg Unable to try or needs assist to  prevent fall    Total Score 15                 PAIN:  Shoulder: 6-7/10 Back : 4-5/10  POSTURE: Seated: forward head rounded shoulders Standing: heavy reliance upon Ue's, feet externally rotated.   PROM/AROM:  AROM BUE LUE: WFL, RUE limited to 90 degrees abduction/flexion   AROM BLE:  Limited hip extension bilaterally    STRENGTH:  Graded on a 0-5 scale Muscle Group Left Right  Hip Flex 3/5 3/5  Hip Abd 3+/5 3+/5  Hip Add 3+/5 3+/5  Hip Ext 3-/5 3-/5  Knee Flex 3/5 3/5  Knee Ext 3/5 3/5  Ankle DF 3/5 2/5  Ankle PF 3+/5 2/5   SENSATION:  BUE :  BLE :   NEUROLOGICAL SCREEN: (2+ unless otherwise noted.) N=normal  Ab=abnormal   Level Dermatome R L  L2 Medial thigh/groin N N  L3 Lower thigh/med.knee N Ab  L4 Medial leg/lat thigh Ab Ab  L5 Lat. leg & dorsal foot Ab Ab  S1 post/lat foot/thigh/leg Ab Ab  S2 Post./med. thigh & leg N Ab    SOMATOSENSORY:  Any N & T in extremities or weakness: reports :         Sensation           Intact      Diminished         Absent  Light touch  Bilateral hands and feet                             COORDINATION: Finger to Nose: intact LUE, unable to perform RLE         Heel Shin Slide Test: unable to perform bilaterally      FUNCTIONAL MOBILITY: STS: heavy reliance upon bilateral Ue's ; Knee extension and hip locking prior to standing position   Sitting EOB without UE support: able to perform 2 minutes   BALANCE: Static Sitting Balance  Normal Able to maintain balance against maximal resistance   Good Able to maintain balance against moderate resistance   Good-/Fair+ Accepts minimal resistance   Fair Able to sit  unsupported without balance loss and without UE support   Poor+ Able to maintain with Minimal assistance from individual or chair x  Poor Unable to maintain balance-requires mod/max support from individual or chair    Static Standing Balance  Normal Able to maintain standing balance against maximal resistance   Good Able to maintain standing balance against moderate resistance   Good-/Fair+ Able to maintain standing balance against minimal resistance   Fair Able to stand unsupported without UE support and without LOB for 1-2 min   Fair- Requires Min A and UE support to maintain standing without loss of balance x  Poor+ Requires mod A and UE support to maintain standing without loss of balance   Poor Requires max A and UE support to maintain standing balance without loss    Dynamic Sitting Balance  Normal Able to sit unsupported and weight shift across midline maximally   Good Able to sit unsupported and weight shift across midline moderately   Good-/Fair+ Able to sit unsupported and weight shift across midline minimally   Fair Minimal weight shifting ipsilateral/front, difficulty crossing midline   Fair- Reach to ipsilateral side and unable to weight shift   Poor + Able to sit unsupported with min A and reach to ipsilateral side, unable to weight shift x  Poor  Able to sit unsupported with mod A and reach ipsilateral/front-can't cross midline    Standing Dynamic Balance  Normal Stand independently unsupported, able to weight shift and cross midline maximally   Good Stand independently unsupported, able to weight shift and cross midline moderately   Good-/Fair+ Stand independently unsupported, able to weight shift across midline minimally   Fair Stand independently unsupported, weight shift, and reach ipsilaterally, loss of balance when crossing midline   Poor+ Able to stand with Min A and reach ipsilaterally, unable to weight shift x  Poor Able to stand with Mod A and minimally reach  ipsilaterally, unable to cross midline.      GAIT: Bilateral foot drop with ambulation with RW. Decreased weight shift and foot clearance bilaterally. Heavy reilance upon Ue's for stabilization and mobility   OUTCOME MEASURES: TEST Outcome Interpretation  5 times sit<>stand Unable to perform from standard chair >66 yo, >15 sec indicates increased risk for falls  10 meter walk test        13         m/s <1.0 m/s indicates increased risk for falls; limited community ambulator  ABC 32.5%   6 minute walk test                Feet 1000 feet is Marine scientist Assessment 15/56 <36/56 (100% risk for falls), 37-45 (80% risk for falls); 46-51 (>50% risk for falls); 52-55 (lower risk <25% of falls)  FOTO 41 Predicted discharge score of 51%         Access Code: [email or web address] URL: [email or web address] Date: 02/24/2021 Prepared by: Precious Bard  Exercises Seated March - 1 x daily - 7 x weekly - 2 sets - 10 reps - 5 hold Seated Long Arc Quad - 1 x daily - 7 x weekly - 2 sets - 10 reps - 5 hold Seated Weight Shifting Without Arm Support - 1 x daily - 7 x weekly - 2 sets - 10 reps - 5 hold Seated Gluteal Sets - 1 x daily - 7 x weekly - 2 sets - 10 reps - 5 hold       Objective measurements completed on examination: See above findings.         Patient is a pleasant 65 year old male who presents to physical therapy for falls, bilateral foot drop, weakness, and instability. Patient has significant LE weakness and instability as can be seen in his muscle testing and balance scores. His Berg scored at a 15/56 indicating high risk for falls. A seated strengthening and weight shifting program for home was created with patient demonstrating understanding. Patient ambulates with a bariatric rollator with poor capacity for mobility and heavy reliance upon Ue's for stabilization. Patient has had >30 falls in the past 6 months. Patient would benefit from skilled physical  therapy to improve stability, increase strength and ambulation, and decrease fall risk to improve quality of life       PT Education - 02/24/21 1617     Education Details goals, POC, HEP    Person(s) Educated Patient    Methods Explanation;Demonstration;Tactile cues;Verbal cues;Handout    Comprehension Verbalized understanding;Returned demonstration;Verbal cues required;Tactile cues required              PT Short Term Goals - 02/24/21 1620       PT SHORT TERM GOAL #1   Title Patient will be independent in home exercise program to improve strength/mobility for better functional independence with ADLs.  Baseline 10/11 HEP given    Time 4    Period Weeks    Status New    Target Date 03/24/21      PT SHORT TERM GOAL #2   Title Patient will report no falls in past two weeks indicating improved stability and decreased fall risk.    Baseline 10/11; multiple falls    Time 4    Period Weeks    Status New    Target Date 03/24/21               PT Long Term Goals - 02/24/21 1621       PT LONG TERM GOAL #1   Title Patient will increase FOTO score to equal to or greater than  51%   to demonstrate statistically significant improvement in mobility and quality of life.    Baseline 10/11: 41%    Time 12    Period Weeks    Status New    Target Date 05/19/21      PT LONG TERM GOAL #2   Title Patient will increase ABC scale score >60% to demonstrate better functional mobility and better confidence with ADLs.     Baseline 10/11: 32.5%    Time 12    Period Weeks    Status New    Target Date 05/19/21      PT LONG TERM GOAL #3   Title Patient will increase Berg Balance score by > 6 points (21/56)  to demonstrate decreased fall risk during functional activities.    Baseline 10/11: 15/56    Time 12    Period Weeks    Status New    Target Date 05/19/21      PT LONG TERM GOAL #4   Title Patient (> 48 years old) will complete five times sit to stand test in < 15 seconds  indicating an increased LE strength and improved balance.    Baseline 10/11: unable to perform from standard height chair    Time 12    Period Weeks    Status New    Target Date 05/19/21      PT LONG TERM GOAL #5   Title Patient will increase BLE gross strength to 4+/5 as to improve functional strength for independent gait, increased standing tolerance and increased ADL ability.    Baseline 10/11: see note    Time 12    Period Weeks    Status New    Target Date 05/19/21                    Plan - 02/24/21 1618     Clinical Impression Statement Patient is a pleasant 65 year old male who presents to physical therapy for falls, bilateral foot drop, weakness, and instability. Patient has significant LE weakness and instability as can be seen in his muscle testing and balance scores. His Berg scored at a 15/56 indicating high risk for falls. A seated strengthening and weight shifting program for home was created with patient demonstrating understanding. Patient ambulates with a bariatric rollator with poor capacity for mobility and heavy reliance upon Ue's for stabilization. Patient has had >30 falls in the past 6 months. Patient would benefit from skilled physical therapy to improve stability, increase strength and ambulation, and decrease fall risk to improve quality of life    Personal Factors and Comorbidities Age;Comorbidity 3+;Finances;Fitness;Past/Current Experience;Social Background;Time since onset of injury/illness/exacerbation;Transportation    Comorbidities leep apnea, HTN, aortic valve disease, GERD, genituourinary malignancy; renal, hydrocephalus  s/p shunt placed 03/2020, DVT, Type II DM, obesity, OA, covid, depression, diabetic polyneuropathy, AD, pressure injury of right foot, and falls    Examination-Activity Limitations Bathing;Bed Mobility;Bend;Caring for Others;Carry;Dressing;Hygiene/Grooming;Stairs;Squat;Sit;Reach Overhead;Locomotion Level;Lift;Stand;Toileting;Transfers     Examination-Participation Restrictions Cleaning;Community Activity;Driving;Interpersonal Relationship;Laundry;Shop;Personal Finances;Occupation;Meal Prep;Volunteer;Yard Work    Conservation officer, historic buildings Evolving/Moderate complexity    Clinical Decision Making Moderate    Rehab Potential Fair    PT Frequency 2x / week    PT Duration 12 weeks    PT Treatment/Interventions ADLs/Self Care Home Management;Aquatic Therapy;Biofeedback;Electrical Stimulation;Traction;Functional mobility training;Stair training;Gait training;DME Instruction;Ultrasound;Moist Heat;Therapeutic activities;Therapeutic exercise;Neuromuscular re-education;Balance training;Manual techniques;Orthotic Fit/Training;Patient/family education;Compression bandaging;Passive range of motion;Dry needling;Vestibular;Taping;Vasopneumatic Device;Energy conservation;Manual lymph drainage;Canalith Repostioning;Visual/perceptual remediation/compensation    PT Next Visit Plan review seated HEP, standing balance, leg press or LE strengthening    PT Home Exercise Plan see above    Consulted and Agree with Plan of Care Patient             Patient will benefit from skilled therapeutic intervention in order to improve the following deficits and impairments:  Abnormal gait, Decreased activity tolerance, Decreased balance, Decreased knowledge of precautions, Decreased endurance, Decreased coordination, Decreased mobility, Decreased range of motion, Difficulty walking, Decreased strength, Hypomobility, Impaired flexibility, Impaired perceived functional ability, Impaired sensation, Postural dysfunction, Improper body mechanics, Cardiopulmonary status limiting activity, Impaired UE functional use, Obesity, Pain  Visit Diagnosis: Unsteadiness on feet  Other abnormalities of gait and mobility  Muscle weakness (generalized)     Problem List Patient Active Problem List   Diagnosis Date Noted   Acute diastolic CHF (congestive heart  failure) (HCC) 08/17/2019   Edema    Acute metabolic encephalopathy 08/12/2019   Acute pulmonary edema (HCC) 08/12/2019   Acute cor pulmonale (HCC) 08/11/2019   Oxygen desaturation 08/08/2019   S/P right rotator cuff repair 08/07/2019   Hypoxia 10/22/2018   Infection of right prepatellar bursa 10/18/2018   Sepsis (HCC) 10/18/2018   Chest congestion 07/20/2017   Viral illness 07/20/2017   Lower respiratory infection 07/12/2017   Laryngitis, acute 07/12/2017   Cough 07/12/2017   Pleurisy 07/12/2017   Depression 06/03/2015   HTN (hypertension) 01/02/2015   Hyperlipidemia 01/02/2015   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 MAIN [facility] SERVICES [address] [place], [place], [address] Phone: [phone]   Fax:  [phone]  Name: Jaishaun Hathorne MRN: [id number] Date of Birth: 1955-07-21

## 2021-03-02 ENCOUNTER — Ambulatory Visit: Payer: Medicare Other | Admitting: Physical Therapy

## 2021-03-03 ENCOUNTER — Other Ambulatory Visit: Payer: Self-pay

## 2021-03-03 ENCOUNTER — Ambulatory Visit: Payer: Medicare Other | Admitting: Podiatry

## 2021-03-03 ENCOUNTER — Ambulatory Visit (INDEPENDENT_AMBULATORY_CARE_PROVIDER_SITE_OTHER): Payer: Medicare Other | Admitting: Podiatry

## 2021-03-03 ENCOUNTER — Ambulatory Visit (INDEPENDENT_AMBULATORY_CARE_PROVIDER_SITE_OTHER): Payer: Medicare Other

## 2021-03-03 DIAGNOSIS — L97512 Non-pressure chronic ulcer of other part of right foot with fat layer exposed: Secondary | ICD-10-CM

## 2021-03-03 DIAGNOSIS — E08621 Diabetes mellitus due to underlying condition with foot ulcer: Secondary | ICD-10-CM

## 2021-03-03 DIAGNOSIS — L989 Disorder of the skin and subcutaneous tissue, unspecified: Secondary | ICD-10-CM

## 2021-03-03 MED ORDER — DOXYCYCLINE HYCLATE 100 MG PO TABS: 100.0000 mg | ORAL_TABLET | Freq: Two times a day (BID) | ORAL | 0 refills | Status: DC

## 2021-03-03 MED ORDER — GENTAMICIN SULFATE 0.1 % EX CREA: 1.0000 "application " | TOPICAL_CREAM | Freq: Two times a day (BID) | CUTANEOUS | 1 refills | Status: DC

## 2021-03-03 NOTE — Progress Notes (Signed)
   Subjective:  65 y.o. male with PMHx of diabetes mellitus presenting today for evaluation of an ulcer that is developed to the patient's right fifth toe.  Patient states that his right fifth toe rubs against his walker and developed a wound when he scraped it.  Patient wears an AFO to the right lower extremity because of a history of multiple recurrent falls.  He uses a walker for assistance.  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  Dermatology:  Wound #1 noted to the right fifth toe measuring approximately 0.5 x 1.0 x 0.2 cm (LxWxD).   To the noted ulceration(s), there is no eschar. There is a moderate amount of slough, fibrin, and necrotic tissue noted. Granulation tissue and wound base is red. There is a minimal amount of serosanguineous drainage noted. There is no exposed bone muscle-tendon ligament or joint. There is no malodor. Periwound integrity is intact. Skin is warm, dry and supple bilateral lower extremities.  Vascular: Palpable pedal pulses bilaterally. No edema or erythema noted. Capillary refill within normal limits.  Neurological: Epicritic and protective threshold diminished bilaterally.   Musculoskeletal Exam: Range of motion within normal limits to all pedal and ankle joints bilateral. Muscle strength 5/5 in all groups bilateral.   Radiographic exam 03/03/2021 RT foot: Normal osseous mineralization.  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  Orthopedic screw noted to the distal portion of the fourth metatarsal from prior surgery.  No osteolytic process or cortical erosion that would suggest any evidence of  osteomyelitis  Assessment: 1.  Ulcer right fifth toe secondary to diabetes mellitus 2. diabetes mellitus w/ peripheral neuropathy 3.  Porokeratosis right subfifth MTP joint   Plan of Care:  1. Patient was evaluated. 2.  Light debridement of the benign skin lesion was performed using a chisel blade without incident or bleeding. 3.  Offloading felt dancers pad was applied to the insoles of the patient's shoe to offload pressure from the fifth MTP joint 4.  Prescription for doxycycline 100 mg 2 times daily #20 5.  Prescription for gentamicin cream applied daily with a Band-Aid 6.  Return to clinic in 3 weeks   Felecia Shelling, DPM [facility]  Dr. Felecia Shelling, DPM    [address]                                        [place], [place] [address]                Office [phone]  Fax [phone]

## 2021-03-05 ENCOUNTER — Other Ambulatory Visit: Payer: Self-pay

## 2021-03-05 ENCOUNTER — Ambulatory Visit: Payer: Medicare Other | Admitting: Physical Therapy

## 2021-03-05 DIAGNOSIS — R2681 Unsteadiness on feet: Secondary | ICD-10-CM | POA: Diagnosis not present

## 2021-03-05 DIAGNOSIS — R2689 Other abnormalities of gait and mobility: Secondary | ICD-10-CM

## 2021-03-05 DIAGNOSIS — M6281 Muscle weakness (generalized): Secondary | ICD-10-CM

## 2021-03-05 NOTE — Therapy (Signed)
[facility] MAIN [facility] SERVICES [address] [place], [place], [address] Phone: [phone]   Fax:  [phone]  Physical Therapy Treatment  Patient Details  Name: Nathan Dean MRN: [id number] Date of Birth: 1955-07-01 Referring Provider (PT): Vernard Gambles [place]   Encounter Date: 03/05/2021   PT End of Session - 03/05/21 2059     Visit Number 2    Number of Visits 24    Date for PT Re-Evaluation 05/19/21    Authorization Type 1/10 eval 02/24/21    PT Start Time 1145    PT Stop Time 1225    PT Time Calculation (min) 40 min    Equipment Utilized During Treatment Gait belt    Activity Tolerance Patient tolerated treatment well;Patient limited by fatigue    Behavior During Therapy WFL for tasks assessed/performed             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    Diabetes (HCC)    GERD (gastroesophageal reflux disease)    Hyperlipidemia    Hypertension    Obesity    Sleep apnea     Past Surgical History:  Procedure Laterality Date   BACK SURGERY     in the 80's -herniated disc   BACK SURGERY     L4 bone spur 1990   CARPAL TUNNEL RELEASE Right 12/28/2016   Procedure: RIGHT ULNAR AND MEDIAN NEUROPLASTY AT WRIST;  Surgeon: Mack Hook, MD;  Location: [facility];  Service: Orthopedics;  Laterality: Right;   CARPAL TUNNEL RELEASE Left    25 years ago   CATARACT EXTRACTION W/ INTRAOCULAR LENS IMPLANT Bilateral    REPLACEMENT TOTAL KNEE BILATERAL  2014   left 2012 rt 2014   SHOULDER OPEN ROTATOR CUFF REPAIR Right 08/07/2019   Procedure: ROTATOR CUFF REPAIR SHOULDER OPEN;  Surgeon: Juanell Fairly, MD;  Location: [facility];  Service: Orthopedics;  Laterality: Right;   TONSILLECTOMY      There were no vitals filed for this visit.   Subjective Assessment - 03/05/21 1154     Subjective Patient returns to physical therapy for his second  physical therapy visit.  Patient reports he has been completing home exercise program regularly but reports it has been extremely easy.  Patient reports no significant changes since previous session and no falls since previous session.    Pertinent History Patient has seen this therapist in 2019. Was being treated at [facility] but is transferring to this clinic for balance training. PMH includes sleep apnea, HTN, aortic valve disease, GERD, genituourinary malignancy; renal, hydrocephalus s/p shunt placed 03/2020, DVT, Type II DM, obesity, OA, covid, depression, diabetic polyneuropathy, AD, pressure injury of right foot, and falls. Patient had R rotator cuff surgery a year and a half and has not had a good recovery process.  Patient has a history of falling prior to COVID, falls decreased after shunt but still are present. Will see a podiatrist soon for his pressure injury on R foot. Referral is for diabetic polyneuropathy, foot drop R foot, and falling. Patient reports >30 falls in past 6 months.    Limitations Sitting;Standing;Walking;House hold activities;Other (comment)    How long can you sit comfortably? a few hours    How long can you stand comfortably? immediately unstable    How long can you walk comfortably? immediately unstable    Patient Stated Goals improve leg strength. more independent    Pain  Onset More than a month ago             Treatment provided this session  Therex:   NuStep level 3 x 5 minutes with lower extremities only.  Patient required cues for proper steps per minute or to maintain speed and also required mod assistance for getting off of NuStep  Reviewed and critiqued form on HEP as below Attempted seated hamstring curls from standard height chair with Airex pad, however, patient unable to obtain enough hip flexion to perform exercise properly.  Transition to his exercise and standing to improve efficacy and also to allow patient to complete this exercise at  home for continued strengthening.  Access Code: [facility] URL: [email or web address] Date: 03/05/2021 Prepared by: Thresa Ross  Exercises Standing Knee Flexion AROM with Counter support - 1 x daily - 7 x weekly - 2 sets - 10 reps -Cues for controlled proper hamstring muscle activation as patient tends to perform march which elicits passive hamstring curl Standing March with Counter Support - 1 x daily - 7 x weekly - 2 sets - 10 reps -Cues for control concentrating eccentric motions Seated Long Arc Quad - 1 x daily - 7 x weekly - 2 sets - 10 reps - 5 second hold -Cues for hold time to increase quad muscle activation Seated Gluteal Sets - 1 x daily - 7 x weekly - 2 sets - 10 reps - 5 hold Forward Step Up with Counter Support - 1 x daily - 3 x weekly - 1 sets - 5 reps -Unable to complete on right lower extremity was able to complete 5 repetitions left lower extremity, patient reported some fatigue.  Patient was instructed to only complete this every other day and only complete on 1 step.    Pt required occasional rest breaks due fatigue, PT was quick to ask when pt appeared to be fatiguing in order to prevent excessive fatigue.   Pt educated throughout session about proper posture and technique with exercises. Improved exercise technique, movement at target joints, use of target muscles after min to mod verbal, visual, tactile cues.  Pt required occasional rest breaks due fatigue, PT was quick to ask when pt appeared to be fatiguing in order to prevent excessive fatigue.    Note: Portions of this document were prepared using Dragon voice recognition software and although reviewed may contain unintentional dictation errors in syntax, grammar, or spelling.                        PT Education - 03/05/21 2058     Education Details Updated HEP, explained importance of safety with HEP.    Person(s) Educated Patient    Methods  Explanation;Handout;Demonstration    Comprehension Verbalized understanding;Returned demonstration              PT Short Term Goals - 02/24/21 1620       PT SHORT TERM GOAL #1   Title Patient will be independent in home exercise program to improve strength/mobility for better functional independence with ADLs.    Baseline 10/11 HEP given    Time 4    Period Weeks    Status New    Target Date 03/24/21      PT SHORT TERM GOAL #2   Title Patient will report no falls in past two weeks indicating improved stability and decreased fall risk.    Baseline 10/11; multiple falls    Time 4    Period Weeks  Status New    Target Date 03/24/21               PT Long Term Goals - 02/24/21 1621       PT LONG TERM GOAL #1   Title Patient will increase FOTO score to equal to or greater than  51%   to demonstrate statistically significant improvement in mobility and quality of life.    Baseline 10/11: 41%    Time 12    Period Weeks    Status New    Target Date 05/19/21      PT LONG TERM GOAL #2   Title Patient will increase ABC scale score >60% to demonstrate better functional mobility and better confidence with ADLs.     Baseline 10/11: 32.5%    Time 12    Period Weeks    Status New    Target Date 05/19/21      PT LONG TERM GOAL #3   Title Patient will increase Berg Balance score by > 6 points (21/56)  to demonstrate decreased fall risk during functional activities.    Baseline 10/11: 15/56    Time 12    Period Weeks    Status New    Target Date 05/19/21      PT LONG TERM GOAL #4   Title Patient (> 19 years old) will complete five times sit to stand test in < 15 seconds indicating an increased LE strength and improved balance.    Baseline 10/11: unable to perform from standard height chair    Time 12    Period Weeks    Status New    Target Date 05/19/21      PT LONG TERM GOAL #5   Title Patient will increase BLE gross strength to 4+/5 as to improve functional  strength for independent gait, increased standing tolerance and increased ADL ability.    Baseline 10/11: see note    Time 12    Period Weeks    Status New    Target Date 05/19/21                   Plan - 03/05/21 2059     Clinical Impression Statement Patient continues to show good motivation for completion of physical therapy exercises in today's session.  Patient reported home exercises being very easy sternal exercises were reviewed and updated as necessary.  Added standing marching and standing hamstring curls and provided handout with some additional exercises as described in this note.  Patient continues demonstrate very quick fatigue with standing exercises he did report he felt like he was benefiting from the exercises.  Patient was also instructed if he was very sore following physical therapy he could alternate original home exercise program with updated home exercise program.  Patient did have 1 loss of balance which was corrected via mod assist from 2 physical therapists when he was getting off of NuStep.  With assistance patient was able to safely transition from sit to stand from the NuStep.  Patient will continue to benefit from skilled physical therapy intervention in order to improve his lower extremity strength, improve his ambulatory capacity, decrease his risk of falls, improve his overall quality of life.    Personal Factors and Comorbidities Age;Comorbidity 3+;Finances;Fitness;Past/Current Experience;Social Background;Time since onset of injury/illness/exacerbation;Transportation    Comorbidities leep apnea, HTN, aortic valve disease, GERD, genituourinary malignancy; renal, hydrocephalus s/p shunt placed 03/2020, DVT, Type II DM, obesity, OA, covid, depression, diabetic polyneuropathy, AD, pressure injury of  right foot, and falls    Examination-Activity Limitations Bathing;Bed Mobility;Bend;Caring for Others;Carry;Dressing;Hygiene/Grooming;Stairs;Squat;Sit;Reach  Overhead;Locomotion Level;Lift;Stand;Toileting;Transfers    Examination-Participation Restrictions Cleaning;Community Activity;Driving;Interpersonal Relationship;Laundry;Shop;Personal Finances;Occupation;Meal Prep;Volunteer;Yard Work    Conservation officer, historic buildings Evolving/Moderate complexity    Rehab Potential Fair    PT Frequency 2x / week    PT Duration 12 weeks    PT Treatment/Interventions ADLs/Self Care Home Management;Aquatic Therapy;Biofeedback;Electrical Stimulation;Traction;Functional mobility training;Stair training;Gait training;DME Instruction;Ultrasound;Moist Heat;Therapeutic activities;Therapeutic exercise;Neuromuscular re-education;Balance training;Manual techniques;Orthotic Fit/Training;Patient/family education;Compression bandaging;Passive range of motion;Dry needling;Vestibular;Taping;Vasopneumatic Device;Energy conservation;Manual lymph drainage;Canalith Repostioning;Visual/perceptual remediation/compensation    PT Next Visit Plan review seated HEP, standing balance, leg press or LE strengthening    PT Home Exercise Plan see above    Consulted and Agree with Plan of Care Patient             Patient will benefit from skilled therapeutic intervention in order to improve the following deficits and impairments:  Abnormal gait, Decreased activity tolerance, Decreased balance, Decreased knowledge of precautions, Decreased endurance, Decreased coordination, Decreased mobility, Decreased range of motion, Difficulty walking, Decreased strength, Hypomobility, Impaired flexibility, Impaired perceived functional ability, Impaired sensation, Postural dysfunction, Improper body mechanics, Cardiopulmonary status limiting activity, Impaired UE functional use, Obesity, Pain  Visit Diagnosis: Unsteadiness on feet  Other abnormalities of gait and mobility  Muscle weakness (generalized)     Problem List Patient Active Problem List   Diagnosis Date Noted   Acute diastolic CHF  (congestive heart failure) (HCC) 08/17/2019   Edema    Acute metabolic encephalopathy 08/12/2019   Acute pulmonary edema (HCC) 08/12/2019   Acute cor pulmonale (HCC) 08/11/2019   Oxygen desaturation 08/08/2019   S/P right rotator cuff repair 08/07/2019   Hypoxia 10/22/2018   Infection of right prepatellar bursa 10/18/2018   Sepsis (HCC) 10/18/2018   Chest congestion 07/20/2017   Viral illness 07/20/2017   Lower respiratory infection 07/12/2017   Laryngitis, acute 07/12/2017   Cough 07/12/2017   Pleurisy 07/12/2017   Depression 06/03/2015   HTN (hypertension) 01/02/2015   Hyperlipidemia 01/02/2015   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

## 2021-03-10 ENCOUNTER — Encounter: Payer: Self-pay | Admitting: Physical Therapy

## 2021-03-10 ENCOUNTER — Other Ambulatory Visit: Payer: Self-pay

## 2021-03-10 ENCOUNTER — Ambulatory Visit: Payer: Medicare Other | Admitting: Physical Therapy

## 2021-03-10 DIAGNOSIS — R262 Difficulty in walking, not elsewhere classified: Secondary | ICD-10-CM

## 2021-03-10 DIAGNOSIS — R2689 Other abnormalities of gait and mobility: Secondary | ICD-10-CM

## 2021-03-10 DIAGNOSIS — R278 Other lack of coordination: Secondary | ICD-10-CM

## 2021-03-10 DIAGNOSIS — M6281 Muscle weakness (generalized): Secondary | ICD-10-CM

## 2021-03-10 DIAGNOSIS — R269 Unspecified abnormalities of gait and mobility: Secondary | ICD-10-CM

## 2021-03-10 DIAGNOSIS — R2681 Unsteadiness on feet: Secondary | ICD-10-CM | POA: Diagnosis not present

## 2021-03-10 NOTE — Therapy (Signed)
[facility] MAIN [facility] SERVICES [address] [place], [place], [address] Phone: [phone]   Fax:  [phone]  Physical Therapy Treatment  Patient Details  Name: Nathan Dean MRN: [id number] Date of Birth: 10/24/1955 Referring Provider (PT): Vernard Gambles [place]   Encounter Date: 03/10/2021   PT End of Session - 03/10/21 1636     Visit Number 3    Number of Visits 24    Date for PT Re-Evaluation 05/19/21    Authorization Type 1/10 eval 02/24/21    PT Start Time 1302    PT Stop Time 1346    PT Time Calculation (min) 44 min    Equipment Utilized During Treatment Gait belt    Activity Tolerance Patient tolerated treatment well;Patient limited by fatigue    Behavior During Therapy WFL for tasks assessed/performed             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    Diabetes (HCC)    GERD (gastroesophageal reflux disease)    Hyperlipidemia    Hypertension    Obesity    Sleep apnea     Past Surgical History:  Procedure Laterality Date   BACK SURGERY     in the 80's -herniated disc   BACK SURGERY     L4 bone spur 1990   CARPAL TUNNEL RELEASE Right 12/28/2016   Procedure: RIGHT ULNAR AND MEDIAN NEUROPLASTY AT WRIST;  Surgeon: Mack Hook, MD;  Location: [facility];  Service: Orthopedics;  Laterality: Right;   CARPAL TUNNEL RELEASE Left    25 years ago   CATARACT EXTRACTION W/ INTRAOCULAR LENS IMPLANT Bilateral    REPLACEMENT TOTAL KNEE BILATERAL  2014   left 2012 rt 2014   SHOULDER OPEN ROTATOR CUFF REPAIR Right 08/07/2019   Procedure: ROTATOR CUFF REPAIR SHOULDER OPEN;  Surgeon: Juanell Fairly, MD;  Location: [facility];  Service: Orthopedics;  Laterality: Right;   TONSILLECTOMY      There were no vitals filed for this visit.   Subjective Assessment - 03/10/21 1329     Subjective Pt states he is doing well today. He reports primary  pain in cervical spine, 6/10, and in B upper arms. Reports no falls/LOB. He does state he would like to be challenged more in therapy.    Pertinent History Patient has seen this therapist in 2019. Was being treated at [facility] but is transferring to this clinic for balance training. PMH includes sleep apnea, HTN, aortic valve disease, GERD, genituourinary malignancy; renal, hydrocephalus s/p shunt placed 03/2020, DVT, Type II DM, obesity, OA, covid, depression, diabetic polyneuropathy, AD, pressure injury of right foot, and falls. Patient had R rotator cuff surgery a year and a half and has not had a good recovery process.  Patient has a history of falling prior to COVID, falls decreased after shunt but still are present. Will see a podiatrist soon for his pressure injury on R foot. Referral is for diabetic polyneuropathy, foot drop R foot, and falling. Patient reports >30 falls in past 6 months.    Limitations Sitting;Standing;Walking;House hold activities;Other (comment)    How long can you sit comfortably? a few hours    How long can you stand comfortably? immediately unstable    How long can you walk comfortably? immediately unstable    Patient Stated Goals improve leg strength. more independent    Currently in Pain? Yes    Pain Score 6  Pain Location Neck    Pain Onset More than a month ago             Interventions  Therapeutic Exercises  At bar with BUE support: Squats, quarter range; 2 x 10 reps. Lunges, quarter range with VC to decrease step length to assist with fluidity of movement; 2 x 6-8 reps BLE.   Lateral step-over 6" hurdle, 8# AW. Attempted with 10#, pt with insufficient clearance of RLE over hurdle. Decreased weight to 8# and performed 8-10 reps BLE to fatigue (last exercise).    Neuromuscular Re-education   At support bar: Static standing, firm surface, feet hip width; occasional UE support due to posterior LOB; minimal hip strategy, absent stepping  strategy. 3 x 30 seconds. Static standing, Airex pad, feet hip width. Encouraged no UE support to challenge, pt relied on UEs to correct LOB. Maintained 10-15 seconds at a time. Multiple bouts.   Step-up onto Airex pad; brief pause standing on pad to attain balance prior to stepping off. VC for step length to fully place foot on pad and fully step off. 10 reps each LE.     Clinical Impression: Pt arrived with concerns of not be challenged enough in therapy. PT addressed these concerns with increased standing time, functional body weight training and stability training on compliant and non-compliant surfaces. Pt demonstrates decreased range through body weight exercises and became "stuck" and full depth of attainable range due to BLE weakness, RLE weaker than LLE. Seated rest breaks taken frequently. Education provided on the purpose of each exercise and application to out-of-the-clinic scenarios. Majority of instability in standing was posterior LOB. Pt would benefit from continued skilled intervention to address BLE weakness in order to improve functional endurance in standing/ambulation and improve stability for an improved QOL and decrease fall risk.       PT Short Term Goals - 02/24/21 1620       PT SHORT TERM GOAL #1   Title Patient will be independent in home exercise program to improve strength/mobility for better functional independence with ADLs.    Baseline 10/11 HEP given    Time 4    Period Weeks    Status New    Target Date 03/24/21      PT SHORT TERM GOAL #2   Title Patient will report no falls in past two weeks indicating improved stability and decreased fall risk.    Baseline 10/11; multiple falls    Time 4    Period Weeks    Status New    Target Date 03/24/21               PT Long Term Goals - 02/24/21 1621       PT LONG TERM GOAL #1   Title Patient will increase FOTO score to equal to or greater than  51%   to demonstrate statistically significant  improvement in mobility and quality of life.    Baseline 10/11: 41%    Time 12    Period Weeks    Status New    Target Date 05/19/21      PT LONG TERM GOAL #2   Title Patient will increase ABC scale score >60% to demonstrate better functional mobility and better confidence with ADLs.     Baseline 10/11: 32.5%    Time 12    Period Weeks    Status New    Target Date 05/19/21      PT LONG TERM GOAL #3   Title  Patient will increase Berg Balance score by > 6 points (21/56)  to demonstrate decreased fall risk during functional activities.    Baseline 10/11: 15/56    Time 12    Period Weeks    Status New    Target Date 05/19/21      PT LONG TERM GOAL #4   Title Patient (> 69 years old) will complete five times sit to stand test in < 15 seconds indicating an increased LE strength and improved balance.    Baseline 10/11: unable to perform from standard height chair    Time 12    Period Weeks    Status New    Target Date 05/19/21      PT LONG TERM GOAL #5   Title Patient will increase BLE gross strength to 4+/5 as to improve functional strength for independent gait, increased standing tolerance and increased ADL ability.    Baseline 10/11: see note    Time 12    Period Weeks    Status New    Target Date 05/19/21                   Plan - 03/10/21 1637     Clinical Impression Statement Pt arrived with concerns of not be challenged enough in therapy. PT addressed these concerns with increased standing time, functional body weight training and stability training on compliant and non-compliant surfaces. Pt demonstrates decreased range through body weight exercises and became "stuck" and full depth of attainable range due to BLE weakness, RLE weaker than LLE. Seated rest breaks taken frequently. Education provided on the purpose of each exercise and application to out-of-the-clinic scenarios. Majority of instability in standing was posterior LOB. Pt would benefit from continued  skilled intervention to address BLE weakness in order to improve functional endurance in standing/ambulation and improve stability for an improved QOL and decrease fall risk.  ?    Personal Factors and Comorbidities Age;Comorbidity 3+;Finances;Fitness;Past/Current Experience;Social Background;Time since onset of injury/illness/exacerbation;Transportation    Comorbidities leep apnea, HTN, aortic valve disease, GERD, genituourinary malignancy; renal, hydrocephalus s/p shunt placed 03/2020, DVT, Type II DM, obesity, OA, covid, depression, diabetic polyneuropathy, AD, pressure injury of right foot, and falls    Examination-Activity Limitations Bathing;Bed Mobility;Bend;Caring for Others;Carry;Dressing;Hygiene/Grooming;Stairs;Squat;Sit;Reach Overhead;Locomotion Level;Lift;Stand;Toileting;Transfers    Examination-Participation Restrictions Cleaning;Community Activity;Driving;Interpersonal Relationship;Laundry;Shop;Personal Finances;Occupation;Meal Prep;Volunteer;Yard Work    Conservation officer, historic buildings Evolving/Moderate complexity    Rehab Potential Fair    PT Frequency 2x / week    PT Duration 12 weeks    PT Treatment/Interventions ADLs/Self Care Home Management;Aquatic Therapy;Biofeedback;Electrical Stimulation;Traction;Functional mobility training;Stair training;Gait training;DME Instruction;Ultrasound;Moist Heat;Therapeutic activities;Therapeutic exercise;Neuromuscular re-education;Balance training;Manual techniques;Orthotic Fit/Training;Patient/family education;Compression bandaging;Passive range of motion;Dry needling;Vestibular;Taping;Vasopneumatic Device;Energy conservation;Manual lymph drainage;Canalith Repostioning;Visual/perceptual remediation/compensation    PT Next Visit Plan review seated HEP, standing balance, leg press or LE strengthening    PT Home Exercise Plan see above    Consulted and Agree with Plan of Care Patient             Patient will benefit from skilled therapeutic  intervention in order to improve the following deficits and impairments:  Abnormal gait, Decreased activity tolerance, Decreased balance, Decreased knowledge of precautions, Decreased endurance, Decreased coordination, Decreased mobility, Decreased range of motion, Difficulty walking, Decreased strength, Hypomobility, Impaired flexibility, Impaired perceived functional ability, Impaired sensation, Postural dysfunction, Improper body mechanics, Cardiopulmonary status limiting activity, Impaired UE functional use, Obesity, Pain  Visit Diagnosis: Abnormality of gait and mobility  Difficulty in walking, not elsewhere classified  Muscle weakness (generalized)  Other abnormalities of gait and mobility  Other lack of coordination  Unsteadiness on feet     Problem List Patient Active Problem List   Diagnosis Date Noted   Acute diastolic CHF (congestive heart failure) (HCC) 08/17/2019   Edema    Acute metabolic encephalopathy 08/12/2019   Acute pulmonary edema (HCC) 08/12/2019   Acute cor pulmonale (HCC) 08/11/2019   Oxygen desaturation 08/08/2019   S/P right rotator cuff repair 08/07/2019   Hypoxia 10/22/2018   Infection of right prepatellar bursa 10/18/2018   Sepsis (HCC) 10/18/2018   Chest congestion 07/20/2017   Viral illness 07/20/2017   Lower respiratory infection 07/12/2017   Laryngitis, acute 07/12/2017   Cough 07/12/2017   Pleurisy 07/12/2017   Depression 06/03/2015   HTN (hypertension) 01/02/2015   Hyperlipidemia 01/02/2015   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 [place], [place], [address] Phone: [phone]    Fax:  [phone]  Name: Nathan Dean MRN: [id number] Date of Birth: 16-May-1956

## 2021-03-12 ENCOUNTER — Encounter: Payer: Self-pay | Admitting: Physical Therapy

## 2021-03-12 ENCOUNTER — Ambulatory Visit: Payer: Medicare Other | Admitting: Physical Therapy

## 2021-03-12 ENCOUNTER — Other Ambulatory Visit: Payer: Self-pay

## 2021-03-12 DIAGNOSIS — R269 Unspecified abnormalities of gait and mobility: Secondary | ICD-10-CM

## 2021-03-12 DIAGNOSIS — R2681 Unsteadiness on feet: Secondary | ICD-10-CM

## 2021-03-12 DIAGNOSIS — R2689 Other abnormalities of gait and mobility: Secondary | ICD-10-CM

## 2021-03-12 DIAGNOSIS — M6281 Muscle weakness (generalized): Secondary | ICD-10-CM

## 2021-03-12 DIAGNOSIS — R278 Other lack of coordination: Secondary | ICD-10-CM

## 2021-03-12 DIAGNOSIS — R262 Difficulty in walking, not elsewhere classified: Secondary | ICD-10-CM

## 2021-03-12 NOTE — Therapy (Signed)
[place] [facility] MAIN [facility] SERVICES [address] [place], [place], [address] Phone: [phone]   Fax:  [phone]  Physical Therapy Treatment  Patient Details  Name: Nathan Dean MRN: [id number] Date of Birth: 08-10-1955 Referring Provider (PT): Vernard Gambles [place]   Encounter Date: 03/12/2021   PT End of Session - 03/12/21 1514     Visit Number 4    Number of Visits 24    Date for PT Re-Evaluation 05/19/21    Authorization Type 1/10 eval 02/24/21    Progress Note Due on Visit 10    PT Start Time 1345    PT Stop Time 1426    PT Time Calculation (min) 41 min    Equipment Utilized During Treatment Gait belt    Activity Tolerance Patient tolerated treatment well;Patient limited by fatigue    Behavior During Therapy WFL for tasks assessed/performed             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    Diabetes (HCC)    GERD (gastroesophageal reflux disease)    Hyperlipidemia    Hypertension    Obesity    Sleep apnea     Past Surgical History:  Procedure Laterality Date   BACK SURGERY     in the 80's -herniated disc   BACK SURGERY     L4 bone spur 1990   CARPAL TUNNEL RELEASE Right 12/28/2016   Procedure: RIGHT ULNAR AND MEDIAN NEUROPLASTY AT WRIST;  Surgeon: Mack Hook, MD;  Location: [facility];  Service: Orthopedics;  Laterality: Right;   CARPAL TUNNEL RELEASE Left    25 years ago   CATARACT EXTRACTION W/ INTRAOCULAR LENS IMPLANT Bilateral    REPLACEMENT TOTAL KNEE BILATERAL  2014   left 2012 rt 2014   SHOULDER OPEN ROTATOR CUFF REPAIR Right 08/07/2019   Procedure: ROTATOR CUFF REPAIR SHOULDER OPEN;  Surgeon: Juanell Fairly, MD;  Location: [facility];  Service: Orthopedics;  Laterality: Right;   TONSILLECTOMY      There were no vitals filed for this visit.   Subjective Assessment - 03/12/21 1351     Subjective Pt reports he had  a fall yesterday when squatting down to the floor to pick something up. Pt reports he had to call 911 in order to get assistance with transitioning form floor to standing. Pt reports continued pain in his arms and shoulders. He reports he has continued with OTC medication.    Pertinent History Patient has seen this therapist in 2019. Was being treated at [facility] clinic but is transferring to this clinic for balance training. PMH includes sleep apnea, HTN, aortic valve disease, GERD, genituourinary malignancy; renal, hydrocephalus s/p shunt placed 03/2020, DVT, Type II DM, obesity, OA, covid, depression, diabetic polyneuropathy, AD, pressure injury of right foot, and falls. Patient had R rotator cuff surgery a year and a half and has not had a good recovery process.  Patient has a history of falling prior to COVID, falls decreased after shunt but still are present. Will see a podiatrist soon for his pressure injury on R foot. Referral is for diabetic polyneuropathy, foot drop R foot, and falling. Patient reports >30 falls in past 6 months.    Limitations Sitting;Standing;Walking;House hold activities;Other (comment)    How long can you sit comfortably? a few hours    How long can you stand comfortably? immediately unstable    How long can you walk comfortably?  immediately unstable    Patient Stated Goals improve leg strength. more independent    Pain Onset More than a month ago               Treatment provided this session  Therex:   NuStep x5 minutes at level 4 with seated max height.  Assistance with getting off of NuStep.  Patient instructed to utilize his upper extremities which she was able to order to perform the transition from sit to stand following NuStep activity.  Leg press activity in Well [employer] gym.  Patient required contact-guard assist and cues for sequencing for getting on and off of the machine. Double leg -Patient performed 18 reps at level 6, 15 reps at level 8, and 12  laps at level 9, -Level 9 patient rated as high difficulty was able to complete all repetitions. weight for level 613 pounds, level 853 pounds, level 9 173 pounds Single-leg -15 repetitions at level 4 for level 4 is 73 pounds -10 repetitions at level 5  weight for level 5 is 93 pounds, -Cues throughout for maintaining eccentric control and patient also required min assist for maintenance of lower extremity placement.   -Had seat at maximum height pushed back so patient could fit due to his high height.  Even with this patient still required assistance getting on due to his increased height.  Step up to 4 inch step.B UE support.  With left lower extremity patient able to complete 10 repetitions required assistance via the rails.  With right lower extremity patient able to complete 5 repetitions was unable to complete with good form and would recommend holding this activity next session as patient was motivated to complete but his form is very poor.    Neuro Re- Ed:   Static stance with feet shoulder width x 1 min without UE support -cues to prevent pose leaning  static balance on airex pad 2 x 1 minute  -significant sway with each repetition, difficulty recovering balance with post LOB   Patient felt fatigued and of session reported minimal pain in left side of the low back following prolonged standing exercises.       Pt educated throughout session about proper posture and technique with exercises. Improved exercise technique, movement at target joints, use of target muscles after min to mod verbal, visual, tactile cues.                          PT Education - 03/12/21 1513     Education Details Pt educated regarding DOMS post session and to report to PT if it lasts longer than 48 hours.    Person(s) Educated Patient    Methods Explanation    Comprehension Verbalized understanding              PT Short Term Goals - 02/24/21 1620       PT SHORT TERM  GOAL #1   Title Patient will be independent in home exercise program to improve strength/mobility for better functional independence with ADLs.    Baseline 10/11 HEP given    Time 4    Period Weeks    Status New    Target Date 03/24/21      PT SHORT TERM GOAL #2   Title Patient will report no falls in past two weeks indicating improved stability and decreased fall risk.    Baseline 10/11; multiple falls    Time 4    Period Weeks  Status New    Target Date 03/24/21               PT Long Term Goals - 02/24/21 1621       PT LONG TERM GOAL #1   Title Patient will increase FOTO score to equal to or greater than  51%   to demonstrate statistically significant improvement in mobility and quality of life.    Baseline 10/11: 41%    Time 12    Period Weeks    Status New    Target Date 05/19/21      PT LONG TERM GOAL #2   Title Patient will increase ABC scale score >60% to demonstrate better functional mobility and better confidence with ADLs.     Baseline 10/11: 32.5%    Time 12    Period Weeks    Status New    Target Date 05/19/21      PT LONG TERM GOAL #3   Title Patient will increase Berg Balance score by > 6 points (21/56)  to demonstrate decreased fall risk during functional activities.    Baseline 10/11: 15/56    Time 12    Period Weeks    Status New    Target Date 05/19/21      PT LONG TERM GOAL #4   Title Patient (> 85 years old) will complete five times sit to stand test in < 15 seconds indicating an increased LE strength and improved balance.    Baseline 10/11: unable to perform from standard height chair    Time 12    Period Weeks    Status New    Target Date 05/19/21      PT LONG TERM GOAL #5   Title Patient will increase BLE gross strength to 4+/5 as to improve functional strength for independent gait, increased standing tolerance and increased ADL ability.    Baseline 10/11: see note    Time 12    Period Weeks    Status New    Target Date 05/19/21                    Plan - 03/12/21 1516     Clinical Impression Statement Patient continues to present with excellent motivation for completion of physical therapy program during today's session.  Patient began with leg press and well zone gym today and patient reported this was increased challenge and he felt it was working his legs at lower ranges of motion which she can sitters to be beneficial.  Patient was instructed that he could have some delayed onset muscle soreness following the session to be aware of it over the next 24 to 48 hours and to report if it lasts any longer.  Patient continues to show quick fatiguing with standing exercises and with step up activity patient continues to be unable to perform step ups on right lower extremity despite lowering step height to 4 inches.  Patient will continue to benefit from skilled physical therapy intervention in order to improve his lower extremity strength, stability, ambulatory capacity, and improve his overall quality of life.    Personal Factors and Comorbidities Age;Comorbidity 3+;Finances;Fitness;Past/Current Experience;Social Background;Time since onset of injury/illness/exacerbation;Transportation    Comorbidities leep apnea, HTN, aortic valve disease, GERD, genituourinary malignancy; renal, hydrocephalus s/p shunt placed 03/2020, DVT, Type II DM, obesity, OA, covid, depression, diabetic polyneuropathy, AD, pressure injury of right foot, and falls    Examination-Activity Limitations Bathing;Bed Mobility;Bend;Caring for Others;Carry;Dressing;Hygiene/Grooming;Stairs;Squat;Sit;Reach Overhead;Locomotion Level;Lift;Stand;Toileting;Transfers    Examination-Participation  Restrictions Cleaning;Community Activity;Driving;Interpersonal Relationship;Laundry;Shop;Personal Finances;Occupation;Meal Prep;Volunteer;Yard Work    Conservation officer, historic buildings Evolving/Moderate complexity    Rehab Potential Fair    PT Frequency 2x / week    PT  Duration 12 weeks    PT Treatment/Interventions ADLs/Self Care Home Management;Aquatic Therapy;Biofeedback;Electrical Stimulation;Traction;Functional mobility training;Stair training;Gait training;DME Instruction;Ultrasound;Moist Heat;Therapeutic activities;Therapeutic exercise;Neuromuscular re-education;Balance training;Manual techniques;Orthotic Fit/Training;Patient/family education;Compression bandaging;Passive range of motion;Dry needling;Vestibular;Taping;Vasopneumatic Device;Energy conservation;Manual lymph drainage;Canalith Repostioning;Visual/perceptual remediation/compensation    PT Next Visit Plan review seated HEP, standing balance, leg press or LE strengthening    PT Home Exercise Plan see above    Consulted and Agree with Plan of Care Patient             Patient will benefit from skilled therapeutic intervention in order to improve the following deficits and impairments:  Abnormal gait, Decreased activity tolerance, Decreased balance, Decreased knowledge of precautions, Decreased endurance, Decreased coordination, Decreased mobility, Decreased range of motion, Difficulty walking, Decreased strength, Hypomobility, Impaired flexibility, Impaired perceived functional ability, Impaired sensation, Postural dysfunction, Improper body mechanics, Cardiopulmonary status limiting activity, Impaired UE functional use, Obesity, Pain  Visit Diagnosis: Abnormality of gait and mobility  Difficulty in walking, not elsewhere classified  Muscle weakness (generalized)  Other abnormalities of gait and mobility  Other lack of coordination  Unsteadiness on feet     Problem List Patient Active Problem List   Diagnosis Date Noted   Acute diastolic CHF (congestive heart failure) (HCC) 08/17/2019   Edema    Acute metabolic encephalopathy 08/12/2019   Acute pulmonary edema (HCC) 08/12/2019   Acute cor pulmonale (HCC) 08/11/2019   Oxygen desaturation 08/08/2019   S/P right rotator cuff repair  08/07/2019   Hypoxia 10/22/2018   Infection of right prepatellar bursa 10/18/2018   Sepsis (HCC) 10/18/2018   Chest congestion 07/20/2017   Viral illness 07/20/2017   Lower respiratory infection 07/12/2017   Laryngitis, acute 07/12/2017   Cough 07/12/2017   Pleurisy 07/12/2017   Depression 06/03/2015   HTN (hypertension) 01/02/2015   Hyperlipidemia 01/02/2015   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 [place], [place], [address] Phone: [phone]   Fax:  [phone]  Name: Nathan Dean MRN: [id number] Date of Birth: 04-25-1956

## 2021-03-16 ENCOUNTER — Ambulatory Visit: Payer: Medicare Other | Admitting: Physical Therapy

## 2021-03-16 ENCOUNTER — Encounter: Payer: Self-pay | Admitting: Physical Therapy

## 2021-03-16 ENCOUNTER — Other Ambulatory Visit: Payer: Self-pay

## 2021-03-16 DIAGNOSIS — R278 Other lack of coordination: Secondary | ICD-10-CM

## 2021-03-16 DIAGNOSIS — M6281 Muscle weakness (generalized): Secondary | ICD-10-CM

## 2021-03-16 DIAGNOSIS — R2681 Unsteadiness on feet: Secondary | ICD-10-CM

## 2021-03-16 DIAGNOSIS — R262 Difficulty in walking, not elsewhere classified: Secondary | ICD-10-CM

## 2021-03-16 DIAGNOSIS — R2689 Other abnormalities of gait and mobility: Secondary | ICD-10-CM

## 2021-03-16 DIAGNOSIS — R269 Unspecified abnormalities of gait and mobility: Secondary | ICD-10-CM

## 2021-03-16 NOTE — Therapy (Signed)
[place] [facility] MAIN [facility] SERVICES [address] [place], [place], [address] Phone: [phone]   Fax:  [phone]  Physical Therapy Treatment  Patient Details  Name: Nathan Dean MRN: [id number] Date of Birth: [birth year] Referring Provider (PT): Vernard Gambles [place]   Encounter Date: 03/16/2021   PT End of Session - 03/16/21 1424     Visit Number 5    Number of Visits 24    Date for PT Re-Evaluation 05/19/21    Authorization Type 1/10 eval 02/24/21    Progress Note Due on Visit 10    PT Start Time 1145    PT Stop Time 1225    PT Time Calculation (min) 40 min    Equipment Utilized During Treatment Gait belt    Activity Tolerance Patient tolerated treatment well;Patient limited by fatigue    Behavior During Therapy WFL for tasks assessed/performed             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;  Service: Orthopedics;  Laterality: Right;   CARPAL TUNNEL RELEASE Left    25 years ago   CATARACT EXTRACTION W/ INTRAOCULAR LENS IMPLANT Bilateral    REPLACEMENT TOTAL KNEE BILATERAL  2014   left 2012 rt 2014   SHOULDER OPEN ROTATOR CUFF REPAIR Right 08/07/2019   Procedure: ROTATOR CUFF REPAIR SHOULDER OPEN;  Surgeon: Juanell Fairly, MD;  Location: [facility];  Service: Orthopedics;  Laterality: Right;   TONSILLECTOMY      There were no vitals filed for this visit.   Subjective Assessment - 03/16/21 1159     Subjective Pt states he "is  here" when asked how he is doing today. Pt denies falls/LOB since previous session.    Pertinent History Patient has seen this therapist in 2019. Was being treated at [facility] clinic but is transferring to this clinic for balance training. PMH includes sleep apnea, HTN, aortic valve disease, GERD, genituourinary malignancy; renal, hydrocephalus s/p shunt placed 03/2020, DVT, Type II DM, obesity, OA, covid, depression, diabetic polyneuropathy, AD, pressure injury of right foot, and falls. Patient had R rotator cuff surgery a year and a half and has not had a good recovery process.  Patient has a history of falling prior to COVID, falls decreased after shunt but still are present. Will see a podiatrist soon for his pressure injury on R foot. Referral is for diabetic polyneuropathy, foot drop R foot, and falling. Patient reports >30 falls in past 6 months.    Limitations Sitting;Standing;Walking;House hold activities;Other (comment)    How long can you sit comfortably? a few hours    How long can you stand comfortably? immediately unstable    How long can you walk comfortably? immediately unstable    Patient Stated Goals improve leg strength. more independent    Pain Onset More than a month ago  Treatment provided this session   Therex:    NuStep x10 minutes at level 8 (Well[employer] NuStep - 300) with seat at max height. CGA getting off of NuStep. Patient instructed to utilize his upper extremities to perform the transition from sit to stand following NuStep activity. BUE used for first 5 minutes.    At bar with BUE support: Squats, quarter range; 15 reps, 6 reps (to fatigue).   Lateral step-over 6" hurdle; 8# AW, 6 reps BLE. No weight, 7 reps BLE.       Neuro Re- Ed:    static balance on airex pad 2 x 2 minute  -significant sway with each repetition, difficulty recovering balance with post LOB    Multiple STS from Rollator throughout session following frequent seated rest  breaks.      Pt educated throughout session about proper posture and technique with exercises. Improved exercise technique, movement at target joints, use of target muscles after min to mod verbal, visual, tactile cues.      Clinical Impression: Pt demonstrates excellent motivation throughout physical therapy session today. Time and resistance was elevated on the NuStep with VC to increase cadence. Body weight exercises were performed as pt reports he did did not fit in the leg press machine due to height. Pt was able to perform increased reps in initial set of squats however fatigued quickly during second set. Pt would benefit from increased cueing on pacing throughout session. Patient reported fatigue with standing BW exercises at end of session. He does endorse come pain in left side of the low back following prolonged standing exercises. Patient will continue to benefit from skilled physical therapy intervention in order to improve his lower extremity strength, stability, ambulatory capacity, and improve his overall quality of life.         PT Short Term Goals - 02/24/21 1620       PT SHORT TERM GOAL #1   Title Patient will be independent in home exercise program to improve strength/mobility for better functional independence with ADLs.    Baseline 10/11 HEP given    Time 4    Period Weeks    Status New    Target Date 03/24/21      PT SHORT TERM GOAL #2   Title Patient will report no falls in past two weeks indicating improved stability and decreased fall risk.    Baseline 10/11; multiple falls    Time 4    Period Weeks    Status New    Target Date 03/24/21               PT Long Term Goals - 02/24/21 1621       PT LONG TERM GOAL #1   Title Patient will increase FOTO score to equal to or greater than  51%   to demonstrate statistically significant improvement in mobility and quality of life.    Baseline 10/11: 41%    Time 12    Period Weeks    Status New     Target Date 05/19/21      PT LONG TERM GOAL #2   Title Patient will increase ABC scale score >60% to demonstrate better functional mobility and better confidence with ADLs.     Baseline 10/11: 32.5%    Time 12    Period Weeks    Status New    Target Date 05/19/21      PT LONG TERM GOAL #3   Title Patient will increase Berg Balance score  by > 6 points (21/56)  to demonstrate decreased fall risk during functional activities.    Baseline 10/11: 15/56    Time 12    Period Weeks    Status New    Target Date 05/19/21      PT LONG TERM GOAL #4   Title Patient (> 65 years old) will complete five times sit to stand test in < 15 seconds indicating an increased LE strength and improved balance.    Baseline 10/11: unable to perform from standard height chair    Time 12    Period Weeks    Status New    Target Date 05/19/21      PT LONG TERM GOAL #5   Title Patient will increase BLE gross strength to 4+/5 as to improve functional strength for independent gait, increased standing tolerance and increased ADL ability.    Baseline 10/11: see note    Time 12    Period Weeks    Status New    Target Date 05/19/21                   Plan - 03/16/21 1425     Clinical Impression Statement Pt demonstrates excellent motivation throughout physical therapy session today. Time and resistance was elevated on the NuStep with VC to increase cadence. Body weight exercises were performed as pt reports he did did not fit in the leg press machine due to height. Pt was able to perform increased reps in initial set of squats however fatigued quickly during second set. Pt would benefit from increased cueing on pacing throughout session. Patient reported fatigue with standing BW exercises at end of session. He does endorse come pain in left side of the low back following prolonged standing exercises. Patient will continue to benefit from skilled physical therapy intervention in order to improve his lower  extremity strength, stability, ambulatory capacity, and improve his overall quality of life.    Personal Factors and Comorbidities Age;Comorbidity 3+;Finances;Fitness;Past/Current Experience;Social Background;Time since onset of injury/illness/exacerbation;Transportation    Comorbidities leep apnea, HTN, aortic valve disease, GERD, genituourinary malignancy; renal, hydrocephalus s/p shunt placed 03/2020, DVT, Type II DM, obesity, OA, covid, depression, diabetic polyneuropathy, AD, pressure injury of right foot, and falls    Examination-Activity Limitations Bathing;Bed Mobility;Bend;Caring for Others;Carry;Dressing;Hygiene/Grooming;Stairs;Squat;Sit;Reach Overhead;Locomotion Level;Lift;Stand;Toileting;Transfers    Examination-Participation Restrictions Cleaning;Community Activity;Driving;Interpersonal Relationship;Laundry;Shop;Personal Finances;Occupation;Meal Prep;Volunteer;Yard Work    Conservation officer, historic buildings Evolving/Moderate complexity    Rehab Potential Fair    PT Frequency 2x / week    PT Duration 12 weeks    PT Treatment/Interventions ADLs/Self Care Home Management;Aquatic Therapy;Biofeedback;Electrical Stimulation;Traction;Functional mobility training;Stair training;Gait training;DME Instruction;Ultrasound;Moist Heat;Therapeutic activities;Therapeutic exercise;Neuromuscular re-education;Balance training;Manual techniques;Orthotic Fit/Training;Patient/family education;Compression bandaging;Passive range of motion;Dry needling;Vestibular;Taping;Vasopneumatic Device;Energy conservation;Manual lymph drainage;Canalith Repostioning;Visual/perceptual remediation/compensation    PT Next Visit Plan review seated HEP, standing balance, leg press or LE strengthening    PT Home Exercise Plan see above    Consulted and Agree with Plan of Care Patient             Patient will benefit from skilled therapeutic intervention in order to improve the following deficits and impairments:  Abnormal  gait, Decreased activity tolerance, Decreased balance, Decreased knowledge of precautions, Decreased endurance, Decreased coordination, Decreased mobility, Decreased range of motion, Difficulty walking, Decreased strength, Hypomobility, Impaired flexibility, Impaired perceived functional ability, Impaired sensation, Postural dysfunction, Improper body mechanics, Cardiopulmonary status limiting activity, Impaired UE functional use, Obesity, Pain  Visit Diagnosis: Abnormality of gait and mobility  Other lack of coordination  Difficulty in walking, not elsewhere classified  Unsteadiness on feet  Muscle weakness (generalized)  Other abnormalities of gait and mobility     Problem List Patient Active Problem List   Diagnosis Date Noted   Acute diastolic CHF (congestive heart failure) (HCC) 08/17/2019   Edema    Acute metabolic encephalopathy 08/12/2019   Acute pulmonary edema (HCC) 08/12/2019   Acute cor pulmonale (HCC) 08/11/2019   Oxygen desaturation 08/08/2019   S/P right rotator cuff repair 08/07/2019   Hypoxia 10/22/2018   Infection of right prepatellar bursa 10/18/2018   Sepsis (HCC) 10/18/2018   Chest congestion 07/20/2017   Viral illness 07/20/2017   Lower respiratory infection 07/12/2017   Laryngitis, acute 07/12/2017   Cough 07/12/2017   Pleurisy 07/12/2017   Depression 06/03/2015   HTN (hypertension) 01/02/2015   Hyperlipidemia 01/02/2015   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 [place], [place], [address] Phone: [phone]   Fax:  [phone]  Name: Brysun Eschmann MRN: [id number] Date of Birth: 10-08-1955

## 2021-03-18 ENCOUNTER — Ambulatory Visit: Payer: Medicare Other

## 2021-03-24 ENCOUNTER — Ambulatory Visit (INDEPENDENT_AMBULATORY_CARE_PROVIDER_SITE_OTHER): Payer: Medicare Other | Admitting: Podiatry

## 2021-03-24 ENCOUNTER — Ambulatory Visit: Payer: Medicare Other | Attending: Student

## 2021-03-24 ENCOUNTER — Other Ambulatory Visit: Payer: Self-pay

## 2021-03-24 DIAGNOSIS — E08621 Diabetes mellitus due to underlying condition with foot ulcer: Secondary | ICD-10-CM | POA: Diagnosis not present

## 2021-03-24 DIAGNOSIS — L97512 Non-pressure chronic ulcer of other part of right foot with fat layer exposed: Secondary | ICD-10-CM | POA: Diagnosis not present

## 2021-03-24 DIAGNOSIS — M6281 Muscle weakness (generalized): Secondary | ICD-10-CM | POA: Diagnosis present

## 2021-03-24 DIAGNOSIS — R269 Unspecified abnormalities of gait and mobility: Secondary | ICD-10-CM | POA: Diagnosis not present

## 2021-03-24 DIAGNOSIS — R278 Other lack of coordination: Secondary | ICD-10-CM | POA: Insufficient documentation

## 2021-03-24 DIAGNOSIS — R2681 Unsteadiness on feet: Secondary | ICD-10-CM | POA: Insufficient documentation

## 2021-03-24 DIAGNOSIS — R262 Difficulty in walking, not elsewhere classified: Secondary | ICD-10-CM | POA: Diagnosis present

## 2021-03-24 DIAGNOSIS — R2689 Other abnormalities of gait and mobility: Secondary | ICD-10-CM | POA: Diagnosis present

## 2021-03-24 NOTE — Progress Notes (Signed)
   Subjective:  65 y.o. male with PMHx of diabetes mellitus presenting today for follow-up evaluation of an ulcer that is developed to the patient's right fifth toe.  Patient states that his right fifth toe rubs against his walker and developed a wound when he scraped it.  Patient wears an AFO to the right lower extremity because of a history of multiple recurrent falls.  He uses a walker for assistance.    Last visit on 03/03/2021 oral antibiotics were prescribed and he completed the prescription.  He continues to apply gentamicin cream to the area.  He also states that the offloading felt pads helped significantly to reduce pressure from the fifth metatarsal head.  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  Dermatology:  Wound #1 noted to the right fifth toe measuring approximately 0.5 x 0.5 x 0.1 cm.  The wound is very dry and stable with a very well adhered stable superficial eschar. Skin is warm, dry and supple bilateral lower extremities.  Vascular: Palpable pedal pulses bilaterally. No edema or erythema noted. Capillary refill within normal limits.  Neurological: Epicritic and protective threshold diminished bilaterally.   Musculoskeletal Exam: Range of motion within normal limits to all pedal and ankle joints bilateral. Muscle strength 5/5 in all groups bilateral.   Radiographic exam 03/03/2021 RT foot: Normal osseous mineralization.  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  Orthopedic screw noted to the distal portion of the fourth metatarsal from prior surgery.  No osteolytic process or cortical erosion that would suggest  any evidence of osteomyelitis  Assessment: 1.  Ulcer right fifth toe secondary to diabetes mellitus 2. diabetes mellitus w/ peripheral neuropathy 3.  Porokeratosis right subfifth MTP joint   Plan of Care:  1. Patient was evaluated.  Continue AFO bracing 2.  Light debridement of the benign skin lesion was performed using a chisel blade without incident or bleeding. 3.  Continue offloading felt dancers pad that were applied to the insoles of the patient's shoe to offload pressure from the fifth MTP joint 4.  Continue gentamicin cream applied daily with a Band-Aid 5.  Appointment with Pedorthist for diabetic shoes and insoles  6.  Overall the wound is significantly improving.  Return to clinic in 4 weeks   Felecia Shelling, DPM [facility]  Dr. Felecia Shelling, DPM    [address] [place], [place] [address]                Office [phone]  Fax [phone]

## 2021-03-24 NOTE — Therapy (Signed)
[facility] MAIN [facility] SERVICES [address] [place], [place], [address] Phone: [phone]   Fax:  [phone]  Physical Therapy Treatment  Patient Details  Name: Nathan Dean MRN: [id number] Date of Birth: 09/18/1955 Referring Provider (PT): Vernard Gambles [place]   Encounter Date: 03/24/2021   PT End of Session - 03/24/21 1007     Visit Number 6    Number of Visits 24    Date for PT Re-Evaluation 05/19/21    Authorization Type 1/10 eval 02/24/21    Progress Note Due on Visit 10    PT Start Time 1000    PT Stop Time 1048    PT Time Calculation (min) 48 min    Equipment Utilized During Treatment Gait belt    Activity Tolerance Patient tolerated treatment well;Patient limited by fatigue    Behavior During Therapy WFL for tasks assessed/performed             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    Diabetes (HCC)    GERD (gastroesophageal reflux disease)    Hyperlipidemia    Hypertension    Obesity    Sleep apnea     Past Surgical History:  Procedure Laterality Date   BACK SURGERY     in the 80's -herniated disc   BACK SURGERY     L4 bone spur 1990   CARPAL TUNNEL RELEASE Right 12/28/2016   Procedure: RIGHT ULNAR AND MEDIAN NEUROPLASTY AT WRIST;  Surgeon: Mack Hook, MD;  Location: [facility];  Service: Orthopedics;  Laterality: Right;   CARPAL TUNNEL RELEASE Left    25 years ago   CATARACT EXTRACTION W/ INTRAOCULAR LENS IMPLANT Bilateral    REPLACEMENT TOTAL KNEE BILATERAL  2014   left 2012 rt 2014   SHOULDER OPEN ROTATOR CUFF REPAIR Right 08/07/2019   Procedure: ROTATOR CUFF REPAIR SHOULDER OPEN;  Surgeon: Juanell Fairly, MD;  Location: [facility];  Service: Orthopedics;  Laterality: Right;   TONSILLECTOMY      There were no vitals filed for this visit.   Subjective Assessment - 03/24/21 1004     Subjective Patint denies any  falls since last visit. Reports some muscle soreness after PT visits but eager to push himself to gain some strength.    Pertinent History Patient has seen this therapist in 2019. Was being treated at [facility] clinic but is transferring to this clinic for balance training. PMH includes sleep apnea, HTN, aortic valve disease, GERD, genituourinary malignancy; renal, hydrocephalus s/p shunt placed 03/2020, DVT, Type II DM, obesity, OA, covid, depression, diabetic polyneuropathy, AD, pressure injury of right foot, and falls. Patient had R rotator cuff surgery a year and a half and has not had a good recovery process.  Patient has a history of falling prior to COVID, falls decreased after shunt but still are present. Will see a podiatrist soon for his pressure injury on R foot. Referral is for diabetic polyneuropathy, foot drop R foot, and falling. Patient reports >30 falls in past 6 months.    Limitations Sitting;Standing;Walking;House hold activities;Other (comment)    How long can you sit comfortably? a few hours    How long can you stand comfortably? immediately unstable    How long can you walk comfortably? immediately unstable    Patient Stated Goals improve leg strength. more independent    Currently in Pain? No/denies    Pain Location Shoulder  Pain Orientation Right    Pain Descriptors / Indicators Aching    Pain Type Chronic pain    Pain Onset More than a month ago    Pain Frequency Intermittent              INTERVENTIONS:     Therapeutic exercises:   Hoist Seated knee ext in [employer]:  Level 4- 1 set of 12 reps  Level 6 - 3 sets of 12 reps   Hoist seated ham curl Level 3- 2 sets of 12 reps Level 4 - 1 set of 12 reps-  Patient rated as "medium"     Nustep Interval training-  Level 0- 1 min Level 5- 30 sec  Level 1 - 1 min (RPE rated at 2/10)  Level 6 - 30 sec Level 1- 1 min Level 7- 30 sec (RPE rated at 4/10)  Level 1 - 1 min Level 7- 30 sec  Level 2- 2  min Patient reported RPE of 3/10 upon completion  Sit to stand (progressive) from bariatric mat table-  24 in x 3 reps  (no UE support)  23 in x 3 reps Min UE support and VC not to use back of legs to assist.  23 in x 3 more reps without UE support- Much improved after VC to scoot out to edge of mat and ant trunk lean. Patient presents with fatigue   Education provided throughout session via VC/TC and demonstration to facilitate movement at target joints and correct muscle activation for all testing and exercises performed.   Clinical Impression: Patient was challenged with resistive LE strengthening yet able to complete with only fatigue as limiting factor. He was responsive to [facility] for improved Sit to stand and demo good ability to use equipment today. He does demo some lack of eccentric control evident with transfers onto Nustep and Hoist leg machine and will continue to benefit from skilled physical therapy intervention in order to improve his lower extremity strength, stability, ambulatory capacity, and improve his overall quality of life                     PT Education - 03/24/21 1006     Education Details Exercise technique    Person(s) Educated Patient    Methods Explanation;Demonstration;Tactile cues;Verbal cues    Comprehension Verbalized understanding;Returned demonstration;Verbal cues required;Need further instruction              PT Short Term Goals - 02/24/21 1620       PT SHORT TERM GOAL #1   Title Patient will be independent in home exercise program to improve strength/mobility for better functional independence with ADLs.    Baseline 10/11 HEP given    Time 4    Period Weeks    Status New    Target Date 03/24/21      PT SHORT TERM GOAL #2   Title Patient will report no falls in past two weeks indicating improved stability and decreased fall risk.    Baseline 10/11; multiple falls    Time 4    Period Weeks    Status New    Target Date  03/24/21               PT Long Term Goals - 02/24/21 1621       PT LONG TERM GOAL #1   Title Patient will increase FOTO score to equal to or greater than  51%   to demonstrate statistically significant improvement in mobility  and quality of life.    Baseline 10/11: 41%    Time 12    Period Weeks    Status New    Target Date 05/19/21      PT LONG TERM GOAL #2   Title Patient will increase ABC scale score >60% to demonstrate better functional mobility and better confidence with ADLs.     Baseline 10/11: 32.5%    Time 12    Period Weeks    Status New    Target Date 05/19/21      PT LONG TERM GOAL #3   Title Patient will increase Berg Balance score by > 6 points (21/56)  to demonstrate decreased fall risk during functional activities.    Baseline 10/11: 15/56    Time 12    Period Weeks    Status New    Target Date 05/19/21      PT LONG TERM GOAL #4   Title Patient (> 68 years old) will complete five times sit to stand test in < 15 seconds indicating an increased LE strength and improved balance.    Baseline 10/11: unable to perform from standard height chair    Time 12    Period Weeks    Status New    Target Date 05/19/21      PT LONG TERM GOAL #5   Title Patient will increase BLE gross strength to 4+/5 as to improve functional strength for independent gait, increased standing tolerance and increased ADL ability.    Baseline 10/11: see note    Time 12    Period Weeks    Status New    Target Date 05/19/21                   Plan - 03/24/21 1007     Clinical Impression Statement Patient was challenged with resistive LE strengthening yet able to complete with only fatigue as limiting factor. He was responsive to [facility] for improved Sit to stand and demo good ability to use equipment today. He does demo some lack of eccentric control evident with transfers onto Nustep and Hoist leg machine and will continue to benefit from skilled physical therapy intervention in  order to improve his lower extremity strength, stability, ambulatory capacity, and improve his overall quality of life    Personal Factors and Comorbidities Age;Comorbidity 3+;Finances;Fitness;Past/Current Experience;Social Background;Time since onset of injury/illness/exacerbation;Transportation    Comorbidities leep apnea, HTN, aortic valve disease, GERD, genituourinary malignancy; renal, hydrocephalus s/p shunt placed 03/2020, DVT, Type II DM, obesity, OA, covid, depression, diabetic polyneuropathy, AD, pressure injury of right foot, and falls    Examination-Activity Limitations Bathing;Bed Mobility;Bend;Caring for Others;Carry;Dressing;Hygiene/Grooming;Stairs;Squat;Sit;Reach Overhead;Locomotion Level;Lift;Stand;Toileting;Transfers    Examination-Participation Restrictions Cleaning;Community Activity;Driving;Interpersonal Relationship;Laundry;Shop;Personal Finances;Occupation;Meal Prep;Volunteer;Yard Work    Conservation officer, historic buildings Evolving/Moderate complexity    Rehab Potential Fair    PT Frequency 2x / week    PT Duration 12 weeks    PT Treatment/Interventions ADLs/Self Care Home Management;Aquatic Therapy;Biofeedback;Electrical Stimulation;Traction;Functional mobility training;Stair training;Gait training;DME Instruction;Ultrasound;Moist Heat;Therapeutic activities;Therapeutic exercise;Neuromuscular re-education;Balance training;Manual techniques;Orthotic Fit/Training;Patient/family education;Compression bandaging;Passive range of motion;Dry needling;Vestibular;Taping;Vasopneumatic Device;Energy conservation;Manual lymph drainage;Canalith Repostioning;Visual/perceptual remediation/compensation    PT Next Visit Plan standing balance,Progressive LE strengthening- Focus on eccentric control    PT Home Exercise Plan No changes    Consulted and Agree with Plan of Care Patient             Patient will benefit from skilled therapeutic intervention in order to improve the following  deficits and impairments:  Abnormal gait, Decreased  activity tolerance, Decreased balance, Decreased knowledge of precautions, Decreased endurance, Decreased coordination, Decreased mobility, Decreased range of motion, Difficulty walking, Decreased strength, Hypomobility, Impaired flexibility, Impaired perceived functional ability, Impaired sensation, Postural dysfunction, Improper body mechanics, Cardiopulmonary status limiting activity, Impaired UE functional use, Obesity, Pain  Visit Diagnosis: Abnormality of gait and mobility  Muscle weakness (generalized)  Difficulty in walking, not elsewhere classified  Unsteadiness on feet     Problem List Patient Active Problem List   Diagnosis Date Noted   Acute diastolic CHF (congestive heart failure) (HCC) 08/17/2019   Edema    Acute metabolic encephalopathy 08/12/2019   Acute pulmonary edema (HCC) 08/12/2019   Acute cor pulmonale (HCC) 08/11/2019   Oxygen desaturation 08/08/2019   S/P right rotator cuff repair 08/07/2019   Hypoxia 10/22/2018   Infection of right prepatellar bursa 10/18/2018   Sepsis (HCC) 10/18/2018   Chest congestion 07/20/2017   Viral illness 07/20/2017   Lower respiratory infection 07/12/2017   Laryngitis, acute 07/12/2017   Cough 07/12/2017   Pleurisy 07/12/2017   Depression 06/03/2015   HTN (hypertension) 01/02/2015   Hyperlipidemia 01/02/2015   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, [place], [address] Phone: [phone]   Fax:  [phone]  Name: Nathan Dean MRN: [id number] Date of Birth: [birth year]

## 2021-03-27 ENCOUNTER — Other Ambulatory Visit: Payer: Self-pay

## 2021-03-27 ENCOUNTER — Ambulatory Visit: Payer: Medicare Other | Admitting: Physical Therapy

## 2021-03-27 ENCOUNTER — Encounter: Payer: Self-pay | Admitting: Physical Therapy

## 2021-03-27 DIAGNOSIS — R2689 Other abnormalities of gait and mobility: Secondary | ICD-10-CM

## 2021-03-27 DIAGNOSIS — R278 Other lack of coordination: Secondary | ICD-10-CM

## 2021-03-27 DIAGNOSIS — R262 Difficulty in walking, not elsewhere classified: Secondary | ICD-10-CM

## 2021-03-27 DIAGNOSIS — M6281 Muscle weakness (generalized): Secondary | ICD-10-CM

## 2021-03-27 DIAGNOSIS — R269 Unspecified abnormalities of gait and mobility: Secondary | ICD-10-CM

## 2021-03-27 DIAGNOSIS — R2681 Unsteadiness on feet: Secondary | ICD-10-CM

## 2021-03-27 NOTE — Therapy (Signed)
[facility] MAIN [facility] SERVICES [address] [place], [place], [address] Phone: [phone]   Fax:  [phone]  Physical Therapy Treatment  Patient Details  Name: Nathan Dean MRN: [id number] Date of Birth: Dec 03, 1955 Referring Provider (PT): Vernard Gambles [place]   Encounter Date: 03/27/2021   PT End of Session - 03/27/21 1125     Visit Number 7    Number of Visits 24    Date for PT Re-Evaluation 05/19/21    Authorization Type 1/10 eval 02/24/21    Progress Note Due on Visit 10    PT Start Time 0847    PT Stop Time 0925    PT Time Calculation (min) 38 min    Equipment Utilized During Treatment Gait belt    Activity Tolerance Patient tolerated treatment well;Patient limited by fatigue    Behavior During Therapy WFL for tasks assessed/performed             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    Diabetes (HCC)    GERD (gastroesophageal reflux disease)    Hyperlipidemia    Hypertension    Obesity    Sleep apnea     Past Surgical History:  Procedure Laterality Date   BACK SURGERY     in the 80's -herniated disc   BACK SURGERY     L4 bone spur 1990   CARPAL TUNNEL RELEASE Right 12/28/2016   Procedure: RIGHT ULNAR AND MEDIAN NEUROPLASTY AT WRIST;  Surgeon: Mack Hook, MD;  Location: [facility];  Service: Orthopedics;  Laterality: Right;   CARPAL TUNNEL RELEASE Left    25 years ago   CATARACT EXTRACTION W/ INTRAOCULAR LENS IMPLANT Bilateral    REPLACEMENT TOTAL KNEE BILATERAL  2014   left 2012 rt 2014   SHOULDER OPEN ROTATOR CUFF REPAIR Right 08/07/2019   Procedure: ROTATOR CUFF REPAIR SHOULDER OPEN;  Surgeon: Juanell Fairly, MD;  Location: [facility];  Service: Orthopedics;  Laterality: Right;   TONSILLECTOMY      There were no vitals filed for this visit.   Subjective Assessment - 03/27/21 1123     Subjective Pt states he is  doing well today. Reports continued pain within shoulder and expresses desire to see an orthopedic doctor to address this concern.    Pertinent History Patient has seen this therapist in 2019. Was being treated at [facility] clinic but is transferring to this clinic for balance training. PMH includes sleep apnea, HTN, aortic valve disease, GERD, genituourinary malignancy; renal, hydrocephalus s/p shunt placed 03/2020, DVT, Type II DM, obesity, OA, covid, depression, diabetic polyneuropathy, AD, pressure injury of right foot, and falls. Patient had R rotator cuff surgery a year and a half and has not had a good recovery process.  Patient has a history of falling prior to COVID, falls decreased after shunt but still are present. Will see a podiatrist soon for his pressure injury on R foot. Referral is for diabetic polyneuropathy, foot drop R foot, and falling. Patient reports >30 falls in past 6 months.    Limitations Sitting;Standing;Walking;House hold activities;Other (comment)    How long can you sit comfortably? a few hours    How long can you stand comfortably? immediately unstable    How long can you walk comfortably? immediately unstable    Patient Stated Goals improve leg strength. more independent    Currently in Pain? No/denies    Pain Onset More than a  month ago             INTERVENTIONS:        Therapeutic exercises:    Hoist Seated knee ext in Well[employer]:  Level 6- 1 set of 10 reps - unable to complete full ROM Level 5 - 2 sets of 10 reps    Hoist seated hamstring curl Level 4- 1 set of 10 reps Level 5 - 1 set of 10 reps Level 6 - 1 set of 10 reps       Nustep Interval training-  Level 1- 1 min Level 5- 30 sec  Level 1 - 1 min (RPE rated at 2/10)  Level 6 - 30 sec Level 1- 1 min Level 7- 30 sec (RPE rated at 4/10)  Level 1 - 1 min Level 8- 30 sec  Level 3- 2 min Patient reported RPE of 3/10 upon completion VC to keep SPM >70 with higher resistance.    Sit to  stand from bariatric mat table-  24 in x 2 reps with UE support. Attempted with no UE support however pt unable to achieve upright position.  26 in 8 x 2 reps, no UE support. Pt presents with fatigue after 2 reps; PT encouraged 1-2 minute     Education provided throughout session via VC/TC and demonstration to facilitate movement at target joints and correct muscle activation for all testing and exercises performed.    Clinical Impression: Pt demonstrates excellent motivation throughout today's session. He was able to accept increased challenge via increased resistance with multiple exercises. Pt had increased difficulty with STS from 24 inch surface performed at the end of the session. Functional endurance remains limited witness during STS exercise as pt was able to perform 2 consecutive STS with good technique however consistently was unable to achieve attempt at 3rd rep. Pt will continue to benefit from skilled physical therapy intervention in order to improve his lower extremity strength, stability, ambulatory capacity, and improve his overall quality of life.          PT Short Term Goals - 02/24/21 1620       PT SHORT TERM GOAL #1   Title Patient will be independent in home exercise program to improve strength/mobility for better functional independence with ADLs.    Baseline 10/11 HEP given    Time 4    Period Weeks    Status New    Target Date 03/24/21      PT SHORT TERM GOAL #2   Title Patient will report no falls in past two weeks indicating improved stability and decreased fall risk.    Baseline 10/11; multiple falls    Time 4    Period Weeks    Status New    Target Date 03/24/21               PT Long Term Goals - 02/24/21 1621       PT LONG TERM GOAL #1   Title Patient will increase FOTO score to equal to or greater than  51%   to demonstrate statistically significant improvement in mobility and quality of life.    Baseline 10/11: 41%    Time 12    Period  Weeks    Status New    Target Date 05/19/21      PT LONG TERM GOAL #2   Title Patient will increase ABC scale score >60% to demonstrate better functional mobility and better confidence with ADLs.     Baseline 10/11: 32.5%  Time 12    Period Weeks    Status New    Target Date 05/19/21      PT LONG TERM GOAL #3   Title Patient will increase Berg Balance score by > 6 points (21/56)  to demonstrate decreased fall risk during functional activities.    Baseline 10/11: 15/56    Time 12    Period Weeks    Status New    Target Date 05/19/21      PT LONG TERM GOAL #4   Title Patient (> 76 years old) will complete five times sit to stand test in < 15 seconds indicating an increased LE strength and improved balance.    Baseline 10/11: unable to perform from standard height chair    Time 12    Period Weeks    Status New    Target Date 05/19/21      PT LONG TERM GOAL #5   Title Patient will increase BLE gross strength to 4+/5 as to improve functional strength for independent gait, increased standing tolerance and increased ADL ability.    Baseline 10/11: see note    Time 12    Period Weeks    Status New    Target Date 05/19/21                   Plan - 03/27/21 1126     Clinical Impression Statement Pt demonstrates excellent motivation throughout today's session. He was able to accept increased challenge via increased resistance with multiple exercises. Pt had increased difficulty with STS from 24 inch surface performed at the end of the session. Functional endurance remains limited witness during STS exercise as pt was able to perform 2 consecutive STS with good technique however consistently was unable to achieve attempt at 3rd rep. Pt will continue to benefit from skilled physical therapy intervention in order to improve his lower extremity strength, stability, ambulatory capacity, and improve his overall quality of life.    Personal Factors and Comorbidities Age;Comorbidity  3+;Finances;Fitness;Past/Current Experience;Social Background;Time since onset of injury/illness/exacerbation;Transportation    Comorbidities leep apnea, HTN, aortic valve disease, GERD, genituourinary malignancy; renal, hydrocephalus s/p shunt placed 03/2020, DVT, Type II DM, obesity, OA, covid, depression, diabetic polyneuropathy, AD, pressure injury of right foot, and falls    Examination-Activity Limitations Bathing;Bed Mobility;Bend;Caring for Others;Carry;Dressing;Hygiene/Grooming;Stairs;Squat;Sit;Reach Overhead;Locomotion Level;Lift;Stand;Toileting;Transfers    Examination-Participation Restrictions Cleaning;Community Activity;Driving;Interpersonal Relationship;Laundry;Shop;Personal Finances;Occupation;Meal Prep;Volunteer;Yard Work    Conservation officer, historic buildings Evolving/Moderate complexity    Rehab Potential Fair    PT Frequency 2x / week    PT Duration 12 weeks    PT Treatment/Interventions ADLs/Self Care Home Management;Aquatic Therapy;Biofeedback;Electrical Stimulation;Traction;Functional mobility training;Stair training;Gait training;DME Instruction;Ultrasound;Moist Heat;Therapeutic activities;Therapeutic exercise;Neuromuscular re-education;Balance training;Manual techniques;Orthotic Fit/Training;Patient/family education;Compression bandaging;Passive range of motion;Dry needling;Vestibular;Taping;Vasopneumatic Device;Energy conservation;Manual lymph drainage;Canalith Repostioning;Visual/perceptual remediation/compensation    PT Next Visit Plan standing balance,Progressive LE strengthening- Focus on eccentric control    PT Home Exercise Plan No changes    Consulted and Agree with Plan of Care Patient             Patient will benefit from skilled therapeutic intervention in order to improve the following deficits and impairments:  Abnormal gait, Decreased activity tolerance, Decreased balance, Decreased knowledge of precautions, Decreased endurance, Decreased coordination,  Decreased mobility, Decreased range of motion, Difficulty walking, Decreased strength, Hypomobility, Impaired flexibility, Impaired perceived functional ability, Impaired sensation, Postural dysfunction, Improper body mechanics, Cardiopulmonary status limiting activity, Impaired UE functional use, Obesity, Pain  Visit Diagnosis: No diagnosis found.  Problem List Patient Active Problem List   Diagnosis Date Noted   Acute diastolic CHF (congestive heart failure) (HCC) 08/17/2019   Edema    Acute metabolic encephalopathy 08/12/2019   Acute pulmonary edema (HCC) 08/12/2019   Acute cor pulmonale (HCC) 08/11/2019   Oxygen desaturation 08/08/2019   S/P right rotator cuff repair 08/07/2019   Hypoxia 10/22/2018   Infection of right prepatellar bursa 10/18/2018   Sepsis (HCC) 10/18/2018   Chest congestion 07/20/2017   Viral illness 07/20/2017   Lower respiratory infection 07/12/2017   Laryngitis, acute 07/12/2017   Cough 07/12/2017   Pleurisy 07/12/2017   Depression 06/03/2015   HTN (hypertension) 01/02/2015   Hyperlipidemia 01/02/2015   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 [place], [place], [address] Phone: [phone]   Fax:  [phone]  Name: Nathan Dean MRN: [id number] Date of Birth: [birth year]

## 2021-03-30 ENCOUNTER — Ambulatory Visit: Payer: Medicare Other

## 2021-04-01 ENCOUNTER — Ambulatory Visit: Payer: Medicare Other | Admitting: Physical Therapy

## 2021-04-01 ENCOUNTER — Other Ambulatory Visit: Payer: Self-pay

## 2021-04-01 DIAGNOSIS — R269 Unspecified abnormalities of gait and mobility: Secondary | ICD-10-CM | POA: Diagnosis not present

## 2021-04-01 DIAGNOSIS — M6281 Muscle weakness (generalized): Secondary | ICD-10-CM

## 2021-04-01 DIAGNOSIS — R262 Difficulty in walking, not elsewhere classified: Secondary | ICD-10-CM

## 2021-04-01 DIAGNOSIS — R2681 Unsteadiness on feet: Secondary | ICD-10-CM

## 2021-04-01 NOTE — Therapy (Signed)
[facility] MAIN [facility] SERVICES [address] [place], [place], [address] Phone: [phone]   Fax:  [phone]  Physical Therapy Treatment  Patient Details  Name: Nathan Dean MRN: [id number] Date of Birth: [birth year] Referring Provider (PT): Vernard Gambles [place]   Encounter Date: 04/01/2021   PT End of Session - 04/01/21 1145     Visit Number 8    Number of Visits 24    Date for PT Re-Evaluation 05/19/21    Authorization Type 1/10 eval 02/24/21    Progress Note Due on Visit 10    PT Start Time 1004    PT Stop Time 1042    PT Time Calculation (min) 38 min    Equipment Utilized During Treatment Gait belt    Activity Tolerance Patient tolerated treatment well;Patient limited by fatigue    Behavior During Therapy WFL for tasks assessed/performed             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    Diabetes (HCC)    GERD (gastroesophageal reflux disease)    Hyperlipidemia    Hypertension    Obesity    Sleep apnea     Past Surgical History:  Procedure Laterality Date   BACK SURGERY     in the 80's -herniated disc   BACK SURGERY     L4 bone spur 1990   CARPAL TUNNEL RELEASE Right 12/28/2016   Procedure: RIGHT ULNAR AND MEDIAN NEUROPLASTY AT WRIST;  Surgeon: Mack Hook, MD;  Location: [facility];  Service: Orthopedics;  Laterality: Right;   CARPAL TUNNEL RELEASE Left    25 years ago   CATARACT EXTRACTION W/ INTRAOCULAR LENS IMPLANT Bilateral    REPLACEMENT TOTAL KNEE BILATERAL  2014   left 2012 rt 2014   SHOULDER OPEN ROTATOR CUFF REPAIR Right 08/07/2019   Procedure: ROTATOR CUFF REPAIR SHOULDER OPEN;  Surgeon: Juanell Fairly, MD;  Location: [facility];  Service: Orthopedics;  Laterality: Right;   TONSILLECTOMY      There were no vitals filed for this visit.   Subjective Assessment - 04/01/21 1143     Subjective Pt states he is  doing well today. No new concerns at this time, reports he is planning to see new orthopaedic MD regarding his right shoulder.    Pertinent History Patient has seen this therapist in 2019. Was being treated at [facility] clinic but is transferring to this clinic for balance training. PMH includes sleep apnea, HTN, aortic valve disease, GERD, genituourinary malignancy; renal, hydrocephalus s/p shunt placed 03/2020, DVT, Type II DM, obesity, OA, covid, depression, diabetic polyneuropathy, AD, pressure injury of right foot, and falls. Patient had R rotator cuff surgery a year and a half and has not had a good recovery process.  Patient has a history of falling prior to COVID, falls decreased after shunt but still are present. Will see a podiatrist soon for his pressure injury on R foot. Referral is for diabetic polyneuropathy, foot drop R foot, and falling. Patient reports >30 falls in past 6 months.    Limitations Sitting;Standing;Walking;House hold activities;Other (comment)    How long can you sit comfortably? a few hours    How long can you stand comfortably? immediately unstable    How long can you walk comfortably? immediately unstable    Patient Stated Goals improve leg strength. more independent    Pain Onset More than a month ago  Treatment provided this session   Therex:    NuStep  interval training Nustep Interval training-  Level 1- 1 min Level 5- 30 sec  Level 1 - 1 min Level 6 - 30 sec Level 1- 1 min Level 7- 30 sec  Level 1 - 1 min Level 7- 30 sec LE only due to some R shoulder pain     Leg press activity in Well zone gym.  Patient required contact-guard assist and cues for sequencing for getting on and off of the machine. Double leg -Patient performed 15 reps at level 6, 12 reps at level 8, and 10 reps  at level 9, -Had seat at maximum height pushed back so patient could fit due to his high height.  Even with this patient still required assistance getting  on due to his height. - Max A with LE placement, PT assisted with maintenance of LE positioning during completion of exercise      Hamstring curls in WZ gym  Level 5- 1 set of 10 reps -  Level 6 - 2 sets of 10 reps   Seated knee extension in WZ gym   Level 4- 1 set of 10 reps Level 5 - 1 set of 10 reps 0 unable to achieve TKE  Level 2 -1 x 10 with 5 second holds at end range, fatigue noted and pt took break followign 3 reps then finished remaining repetitions.   Pt had times rest break following exercises to ensure adequate recovery for ambulation and STS following        Pt educated throughout session about proper posture and technique with exercises. Improved exercise technique, movement at target joints, use of target muscles after min to mod verbal, visual, tactile cues.                        PT Education - 04/01/21 1144     Education Details Exercise repetitions and weight for adequate strenght gains    Person(s) Educated Patient    Methods Explanation;Demonstration;Tactile cues              PT Short Term Goals - 02/24/21 1620       PT SHORT TERM GOAL #1   Title Patient will be independent in home exercise program to improve strength/mobility for better functional independence with ADLs.    Baseline 10/11 HEP given    Time 4    Period Weeks    Status New    Target Date 03/24/21      PT SHORT TERM GOAL #2   Title Patient will report no falls in past two weeks indicating improved stability and decreased fall risk.    Baseline 10/11; multiple falls    Time 4    Period Weeks    Status New    Target Date 03/24/21               PT Long Term Goals - 02/24/21 1621       PT LONG TERM GOAL #1   Title Patient will increase FOTO score to equal to or greater than  51%   to demonstrate statistically significant improvement in mobility and quality of life.    Baseline 10/11: 41%    Time 12    Period Weeks    Status New    Target Date  05/19/21      PT LONG TERM GOAL #2   Title Patient will increase ABC scale score >60% to demonstrate  better functional mobility and better confidence with ADLs.     Baseline 10/11: 32.5%    Time 12    Period Weeks    Status New    Target Date 05/19/21      PT LONG TERM GOAL #3   Title Patient will increase Berg Balance score by > 6 points (21/56)  to demonstrate decreased fall risk during functional activities.    Baseline 10/11: 15/56    Time 12    Period Weeks    Status New    Target Date 05/19/21      PT LONG TERM GOAL #4   Title Patient (> 27 years old) will complete five times sit to stand test in < 15 seconds indicating an increased LE strength and improved balance.    Baseline 10/11: unable to perform from standard height chair    Time 12    Period Weeks    Status New    Target Date 05/19/21      PT LONG TERM GOAL #5   Title Patient will increase BLE gross strength to 4+/5 as to improve functional strength for independent gait, increased standing tolerance and increased ADL ability.    Baseline 10/11: see note    Time 12    Period Weeks    Status New    Target Date 05/19/21                   Plan - 04/01/21 1145     Clinical Impression Statement Pt demonstrates excellent motivation throughout today's session. He was able to accept increased challenge via increased resistance with multiple exercises. Pt will continue to benefit from skilled physical therapy intervention in order to improve his lower extremity strength, stability, ambulatory capacity, and improve his overall quality of life.    Personal Factors and Comorbidities Age;Comorbidity 3+;Finances;Fitness;Past/Current Experience;Social Background;Time since onset of injury/illness/exacerbation;Transportation    Comorbidities leep apnea, HTN, aortic valve disease, GERD, genituourinary malignancy; renal, hydrocephalus s/p shunt placed 03/2020, DVT, Type II DM, obesity, OA, covid, depression, diabetic  polyneuropathy, AD, pressure injury of right foot, and falls    Examination-Activity Limitations Bathing;Bed Mobility;Bend;Caring for Others;Carry;Dressing;Hygiene/Grooming;Stairs;Squat;Sit;Reach Overhead;Locomotion Level;Lift;Stand;Toileting;Transfers    Examination-Participation Restrictions Cleaning;Community Activity;Driving;Interpersonal Relationship;Laundry;Shop;Personal Finances;Occupation;Meal Prep;Volunteer;Yard Work    Conservation officer, historic buildings Evolving/Moderate complexity    Rehab Potential Fair    PT Frequency 2x / week    PT Duration 12 weeks    PT Treatment/Interventions ADLs/Self Care Home Management;Aquatic Therapy;Biofeedback;Electrical Stimulation;Traction;Functional mobility training;Stair training;Gait training;DME Instruction;Ultrasound;Moist Heat;Therapeutic activities;Therapeutic exercise;Neuromuscular re-education;Balance training;Manual techniques;Orthotic Fit/Training;Patient/family education;Compression bandaging;Passive range of motion;Dry needling;Vestibular;Taping;Vasopneumatic Device;Energy conservation;Manual lymph drainage;Canalith Repostioning;Visual/perceptual remediation/compensation    PT Next Visit Plan standing balance,Progressive LE strengthening- Focus on eccentric control    PT Home Exercise Plan No changes    Consulted and Agree with Plan of Care Patient             Patient will benefit from skilled therapeutic intervention in order to improve the following deficits and impairments:  Abnormal gait, Decreased activity tolerance, Decreased balance, Decreased knowledge of precautions, Decreased endurance, Decreased coordination, Decreased mobility, Decreased range of motion, Difficulty walking, Decreased strength, Hypomobility, Impaired flexibility, Impaired perceived functional ability, Impaired sensation, Postural dysfunction, Improper body mechanics, Cardiopulmonary status limiting activity, Impaired UE functional use, Obesity, Pain  Visit  Diagnosis: Abnormality of gait and mobility  Difficulty in walking, not elsewhere classified  Unsteadiness on feet  Muscle weakness (generalized)     Problem List Patient Active Problem List   Diagnosis Date Noted  Acute diastolic CHF (congestive heart failure) (HCC) 08/17/2019   Edema    Acute metabolic encephalopathy 08/12/2019   Acute pulmonary edema (HCC) 08/12/2019   Acute cor pulmonale (HCC) 08/11/2019   Oxygen desaturation 08/08/2019   S/P right rotator cuff repair 08/07/2019   Hypoxia 10/22/2018   Infection of right prepatellar bursa 10/18/2018   Sepsis (HCC) 10/18/2018   Chest congestion 07/20/2017   Viral illness 07/20/2017   Lower respiratory infection 07/12/2017   Laryngitis, acute 07/12/2017   Cough 07/12/2017   Pleurisy 07/12/2017   Depression 06/03/2015   HTN (hypertension) 01/02/2015   Hyperlipidemia 01/02/2015   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

## 2021-04-08 ENCOUNTER — Other Ambulatory Visit: Payer: Self-pay

## 2021-04-08 ENCOUNTER — Ambulatory Visit: Payer: Medicare Other | Admitting: Physical Therapy

## 2021-04-08 DIAGNOSIS — R2681 Unsteadiness on feet: Secondary | ICD-10-CM

## 2021-04-08 DIAGNOSIS — R269 Unspecified abnormalities of gait and mobility: Secondary | ICD-10-CM

## 2021-04-08 DIAGNOSIS — M6281 Muscle weakness (generalized): Secondary | ICD-10-CM

## 2021-04-08 DIAGNOSIS — R262 Difficulty in walking, not elsewhere classified: Secondary | ICD-10-CM

## 2021-04-08 NOTE — Therapy (Signed)
[facility] MAIN [facility] SERVICES [address] [place], [place], [address] Phone: [phone]   Fax:  [phone]  Physical Therapy Treatment  Patient Details  Name: Nathan Dean MRN: [id number] Date of Birth: 30-Aug-1955 Referring Provider (PT): Vernard Gambles [place]   Encounter Date: 04/08/2021   PT End of Session - 04/08/21 1038     Visit Number 9    Number of Visits 24    Date for PT Re-Evaluation 05/19/21    Authorization Type 1/10 eval 02/24/21    Progress Note Due on Visit 10    PT Start Time 0916    PT Stop Time 1000    PT Time Calculation (min) 44 min    Equipment Utilized During Treatment Gait belt    Activity Tolerance Patient tolerated treatment well;Patient limited by fatigue    Behavior During Therapy WFL for tasks assessed/performed             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    Diabetes (HCC)    GERD (gastroesophageal reflux disease)    Hyperlipidemia    Hypertension    Obesity    Sleep apnea     Past Surgical History:  Procedure Laterality Date   BACK SURGERY     in the 80's -herniated disc   BACK SURGERY     L4 bone spur 1990   CARPAL TUNNEL RELEASE Right 12/28/2016   Procedure: RIGHT ULNAR AND MEDIAN NEUROPLASTY AT WRIST;  Surgeon: Mack Hook, MD;  Location: [facility];  Service: Orthopedics;  Laterality: Right;   CARPAL TUNNEL RELEASE Left    25 years ago   CATARACT EXTRACTION W/ INTRAOCULAR LENS IMPLANT Bilateral    REPLACEMENT TOTAL KNEE BILATERAL  2014   left 2012 rt 2014   SHOULDER OPEN ROTATOR CUFF REPAIR Right 08/07/2019   Procedure: ROTATOR CUFF REPAIR SHOULDER OPEN;  Surgeon: Juanell Fairly, MD;  Location: [facility];  Service: Orthopedics;  Laterality: Right;   TONSILLECTOMY      There were no vitals filed for this visit.   Subjective Assessment - 04/08/21 1035     Subjective Pt states he is  doing well today. No new concerns at this time, reports he is planning to see new orthopaedic MD regarding his right shoulder.Reports some soreness following last session. Also reports    Pertinent History Patient has seen this therapist in 2019. Was being treated at [facility] but is transferring to this clinic for balance training. PMH includes sleep apnea, HTN, aortic valve disease, GERD, genituourinary malignancy; renal, hydrocephalus s/p shunt placed 03/2020, DVT, Type II DM, obesity, OA, covid, depression, diabetic polyneuropathy, AD, pressure injury of right foot, and falls. Patient had R rotator cuff surgery a year and a half and has not had a good recovery process.  Patient has a history of falling prior to COVID, falls decreased after shunt but still are present. Will see a podiatrist soon for his pressure injury on R foot. Referral is for diabetic polyneuropathy, foot drop R foot, and falling. Patient reports >30 falls in past 6 months.    Limitations Sitting;Standing;Walking;House hold activities;Other (comment)    How long can you sit comfortably? a few hours    How long can you stand comfortably? immediately unstable    How long can you walk comfortably? immediately unstable    Patient Stated Goals improve leg strength. more independent    Pain Onset More  than a month ago             Therex      Leg press activity in [facility] [employer] gym.  Patient required contact-guard assist and cues for sequencing for getting on and off of the machine. Double leg -Patient performed 15 reps at level 6, 12 reps at level 8, and 10 reps  at level 9, -Had seat at maximum height pushed back so patient could fit due to his high height.  Even with this patient still required assistance getting on due to his height. - Max A with LE placement, PT assisted with maintenance of LE positioning during completion of exercise Single leg Level 5 2 x 10 reps on ea LE  -mod A with positioning of LE and for  stability when completing.    NuStep  interval training- LE only  Level 1- 1 min Level 4- 30 sec  Level 1 - 1 min  Level 5- 30 sec Level 1- 1 min Level 6- 30 sec  Level 1 - 1 min Rest following prior to transfer and next exercise   Hamstring curls in [facility] gym  Level 6- 1 set of 10 reps -  Level 8 - 2 sets of 10 reps   Seated knee extension in [facility] gym   Level 4- 2 set of 10 reps Level 2 -2 x 5 with 5 second holds at end range, fatigue noted and pt took break followign 3 reps then finished remaining repetitions.   Pt had times rest break following exercises to ensure adequate recovery for ambulation and STS following    Neuro re-ed  Standing in parallel bars  Semitandem stance x 30 sec ea LE Adducted stance (NBOS) x 30 sec      Pt educated throughout session about proper posture and technique with exercises. Improved exercise technique, movement at target joints, use of target muscles after min to mod verbal, visual, tactile cues.                            PT Short Term Goals - 02/24/21 1620       PT SHORT TERM GOAL #1   Title Patient will be independent in home exercise program to improve strength/mobility for better functional independence with ADLs.    Baseline 10/11 HEP given    Time 4    Period Weeks    Status New    Target Date 03/24/21      PT SHORT TERM GOAL #2   Title Patient will report no falls in past two weeks indicating improved stability and decreased fall risk.    Baseline 10/11; multiple falls    Time 4    Period Weeks    Status New    Target Date 03/24/21               PT Long Term Goals - 02/24/21 1621       PT LONG TERM GOAL #1   Title Patient will increase FOTO score to equal to or greater than  51%   to demonstrate statistically significant improvement in mobility and quality of life.    Baseline 10/11: 41%    Time 12    Period Weeks    Status New    Target Date 05/19/21      PT LONG TERM GOAL #2   Title  Patient will increase ABC scale score >60% to demonstrate better functional mobility and better confidence  with ADLs.     Baseline 10/11: 32.5%    Time 12    Period Weeks    Status New    Target Date 05/19/21      PT LONG TERM GOAL #3   Title Patient will increase Berg Balance score by > 6 points (21/56)  to demonstrate decreased fall risk during functional activities.    Baseline 10/11: 15/56    Time 12    Period Weeks    Status New    Target Date 05/19/21      PT LONG TERM GOAL #4   Title Patient (> 38 years old) will complete five times sit to stand test in < 15 seconds indicating an increased LE strength and improved balance.    Baseline 10/11: unable to perform from standard height chair    Time 12    Period Weeks    Status New    Target Date 05/19/21      PT LONG TERM GOAL #5   Title Patient will increase BLE gross strength to 4+/5 as to improve functional strength for independent gait, increased standing tolerance and increased ADL ability.    Baseline 10/11: see note    Time 12    Period Weeks    Status New    Target Date 05/19/21                   Plan - 04/08/21 1039     Clinical Impression Statement Pt demonstrates excellent motivation throughout today's session. He was able to accept increased challenge via increased resistance with multiple exercises. Pt will continue to benefit from skilled physical therapy intervention in order to improve his lower extremity strength, stability, ambulatory capacity, and improve his overall quality of life.    Personal Factors and Comorbidities Age;Comorbidity 3+;Finances;Fitness;Past/Current Experience;Social Background;Time since onset of injury/illness/exacerbation;Transportation    Comorbidities leep apnea, HTN, aortic valve disease, GERD, genituourinary malignancy; renal, hydrocephalus s/p shunt placed 03/2020, DVT, Type II DM, obesity, OA, covid, depression, diabetic polyneuropathy, AD, pressure injury of right foot,  and falls    Examination-Activity Limitations Bathing;Bed Mobility;Bend;Caring for Others;Carry;Dressing;Hygiene/Grooming;Stairs;Squat;Sit;Reach Overhead;Locomotion Level;Lift;Stand;Toileting;Transfers    Examination-Participation Restrictions Cleaning;Community Activity;Driving;Interpersonal Relationship;Laundry;Shop;Personal Finances;Occupation;Meal Prep;Volunteer;Yard Work    Conservation officer, historic buildings Evolving/Moderate complexity    Rehab Potential Fair    PT Frequency 2x / week    PT Duration 12 weeks    PT Treatment/Interventions ADLs/Self Care Home Management;Aquatic Therapy;Biofeedback;Electrical Stimulation;Traction;Functional mobility training;Stair training;Gait training;DME Instruction;Ultrasound;Moist Heat;Therapeutic activities;Therapeutic exercise;Neuromuscular re-education;Balance training;Manual techniques;Orthotic Fit/Training;Patient/family education;Compression bandaging;Passive range of motion;Dry needling;Vestibular;Taping;Vasopneumatic Device;Energy conservation;Manual lymph drainage;Canalith Repostioning;Visual/perceptual remediation/compensation    PT Next Visit Plan standing balance,Progressive LE strengthening- Focus on eccentric control    PT Home Exercise Plan No changes    Consulted and Agree with Plan of Care Patient             Patient will benefit from skilled therapeutic intervention in order to improve the following deficits and impairments:  Abnormal gait, Decreased activity tolerance, Decreased balance, Decreased knowledge of precautions, Decreased endurance, Decreased coordination, Decreased mobility, Decreased range of motion, Difficulty walking, Decreased strength, Hypomobility, Impaired flexibility, Impaired perceived functional ability, Impaired sensation, Postural dysfunction, Improper body mechanics, Cardiopulmonary status limiting activity, Impaired UE functional use, Obesity, Pain  Visit Diagnosis: Abnormality of gait and  mobility  Difficulty in walking, not elsewhere classified  Unsteadiness on feet  Muscle weakness (generalized)     Problem List Patient Active Problem List   Diagnosis Date Noted   Acute diastolic CHF (congestive heart  failure) (HCC) 08/17/2019   Edema    Acute metabolic encephalopathy 08/12/2019   Acute pulmonary edema (HCC) 08/12/2019   Acute cor pulmonale (HCC) 08/11/2019   Oxygen desaturation 08/08/2019   S/P right rotator cuff repair 08/07/2019   Hypoxia 10/22/2018   Infection of right prepatellar bursa 10/18/2018   Sepsis (HCC) 10/18/2018   Chest congestion 07/20/2017   Viral illness 07/20/2017   Lower respiratory infection 07/12/2017   Laryngitis, acute 07/12/2017   Cough 07/12/2017   Pleurisy 07/12/2017   Depression 06/03/2015   HTN (hypertension) 01/02/2015   Hyperlipidemia 01/02/2015   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 [place], [place], [address] Phone: [phone]   Fax:  [phone]  Name: Guerino Caporale MRN: [id number] Date of Birth: [birth year]

## 2021-04-15 ENCOUNTER — Telehealth: Payer: Self-pay | Admitting: Physical Therapy

## 2021-04-15 ENCOUNTER — Ambulatory Visit: Payer: Medicare Other | Admitting: Physical Therapy

## 2021-04-16 NOTE — Telephone Encounter (Deleted)
Pt contacted and reports he had a fall the day prior and is experiencing pain as a result which resulted in him missing his PT appointment.

## 2021-04-16 NOTE — Telephone Encounter (Signed)
Pt reports he had a fall last night and reports he has pain limiting his ability to participate in therapy today.

## 2021-04-17 ENCOUNTER — Encounter: Payer: Self-pay | Admitting: Physical Therapy

## 2021-04-17 ENCOUNTER — Ambulatory Visit: Payer: Medicare Other | Attending: Student | Admitting: Physical Therapy

## 2021-04-17 DIAGNOSIS — M6281 Muscle weakness (generalized): Secondary | ICD-10-CM | POA: Insufficient documentation

## 2021-04-17 DIAGNOSIS — R269 Unspecified abnormalities of gait and mobility: Secondary | ICD-10-CM | POA: Insufficient documentation

## 2021-04-17 DIAGNOSIS — R2681 Unsteadiness on feet: Secondary | ICD-10-CM | POA: Diagnosis present

## 2021-04-17 DIAGNOSIS — R2689 Other abnormalities of gait and mobility: Secondary | ICD-10-CM | POA: Diagnosis present

## 2021-04-17 DIAGNOSIS — R278 Other lack of coordination: Secondary | ICD-10-CM | POA: Diagnosis present

## 2021-04-17 DIAGNOSIS — R262 Difficulty in walking, not elsewhere classified: Secondary | ICD-10-CM | POA: Insufficient documentation

## 2021-04-17 NOTE — Therapy (Signed)
[facility] MAIN [facility] SERVICES [address] [place], [place], [address] Phone: [phone]   Fax:  [phone]  Physical Therapy Treatment/Physical Therapy Progress Note   Dates of reporting period  02/24/21   to   04/17/21   Patient Details  Name: Nathan Dean MRN: [id number] Date of Birth: 02-May-1956 Referring Provider (PT): Vernard Gambles [place]   Encounter Date: 04/17/2021   PT End of Session - 04/17/21 0934     Visit Number 10    Number of Visits 24    Date for PT Re-Evaluation 05/19/21    Authorization Type 1/10 eval 02/24/21    Progress Note Due on Visit 10    PT Start Time 0929    PT Stop Time 1013    PT Time Calculation (min) 44 min    Equipment Utilized During Treatment Gait belt    Activity Tolerance Patient tolerated treatment well;Patient limited by fatigue    Behavior During Therapy WFL for tasks assessed/performed             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    Diabetes (HCC)    GERD (gastroesophageal reflux disease)    Hyperlipidemia    Hypertension    Obesity    Sleep apnea     Past Surgical History:  Procedure Laterality Date   BACK SURGERY     in the 80's -herniated disc   BACK SURGERY     L4 bone spur 1990   CARPAL TUNNEL RELEASE Right 12/28/2016   Procedure: RIGHT ULNAR AND MEDIAN NEUROPLASTY AT WRIST;  Surgeon: Mack Hook, MD;  Location: [facility];  Service: Orthopedics;  Laterality: Right;   CARPAL TUNNEL RELEASE Left    25 years ago   CATARACT EXTRACTION W/ INTRAOCULAR LENS IMPLANT Bilateral    REPLACEMENT TOTAL KNEE BILATERAL  2014   left 2012 rt 2014   SHOULDER OPEN ROTATOR CUFF REPAIR Right 08/07/2019   Procedure: ROTATOR CUFF REPAIR SHOULDER OPEN;  Surgeon: Juanell Fairly, MD;  Location: [facility];  Service: Orthopedics;  Laterality: Right;   TONSILLECTOMY      There were no vitals filed for  this visit.   Subjective Assessment - 04/17/21 0932     Subjective Pt states he is having pain in his back at this time following a fall at home earlier this week. Pt reports he would like his low back assessed in order to ensure pain he is experiencing ins muskuloskeletal in nature.    Pertinent History Patient has seen this therapist in 2019. Was being treated at [facility] but is transferring to this clinic for balance training. PMH includes sleep apnea, HTN, aortic valve disease, GERD, genituourinary malignancy; renal, hydrocephalus s/p shunt placed 03/2020, DVT, Type II DM, obesity, OA, covid, depression, diabetic polyneuropathy, AD, pressure injury of right foot, and falls. Patient had R rotator cuff surgery a year and a half and has not had a good recovery process.  Patient has a history of falling prior to COVID, falls decreased after shunt but still are present. Will see a podiatrist soon for his pressure injury on R foot. Referral is for diabetic polyneuropathy, foot drop R foot, and falling. Patient reports >30 falls in past 6 months.    Limitations Sitting;Standing;Walking;House hold activities;Other (comment)    How long can you sit comfortably? a few hours    How long can you stand comfortably? immediately unstable  How long can you walk comfortably? immediately unstable    Patient Stated Goals improve leg strength. more independent    Currently in Pain? Yes    Pain Score 6     Pain Location Shoulder    Pain Orientation Right    Pain Descriptors / Indicators Aching    Pain Type Chronic pain    Pain Onset More than a month ago    Pain Frequency Intermittent    Aggravating Factors  weightbearing, moving    Pain Relieving Factors rest    Multiple Pain Sites Yes    Pain Score 4    Pain Location Back    Pain Orientation Right    Pain Descriptors / Indicators Sharp    Pain Type Acute pain    Pain Onset In the past 7 days    Pain Frequency Intermittent    Aggravating  Factors  movement                [facility] PT Assessment - 04/17/21 0001       Observation/Other Assessments   Focus on Therapeutic Outcomes (FOTO)  53      ROM / Strength   AROM / PROM / Strength Strength      Strength   Overall Strength Comments Right lower extremity grossly 4-5 with knee flexion extension and 3+ with hip flexion, left lower extremity grossly 4/5 with knee flexion extension and hip flexion      Berg Balance Test   Sit to Stand Able to stand using hands after several tries    Standing Unsupported Able to stand 2 minutes with supervision    Sitting with Back Unsupported but Feet Supported on Floor or Stool Able to sit safely and securely 2 minutes    Stand to Sit Controls descent by using hands    Transfers Able to transfer with verbal cueing and /or supervision    Standing Unsupported with Eyes Closed Able to stand 3 seconds    Standing Unsupported with Feet Together Able to place feet together independently and stand for 1 minute with supervision    From Standing, Reach Forward with Outstretched Arm Can reach forward >5 cm safely (2")    From Standing Position, Pick up Object from Floor Unable to try/needs assist to keep balance    From Standing Position, Turn to Look Behind Over each Shoulder Needs supervision when turning    Turn 360 Degrees Needs assistance while turning    Standing Unsupported, Alternately Place Feet on Step/Stool Needs assistance to keep from falling or unable to try    Standing Unsupported, One Foot in Front Needs help to step but can hold 15 seconds    Standing on One Leg Unable to try or needs assist to prevent fall    Total Score 23              5 times sit to stand completed in 24.5 seconds on 24 inch table with minimal upper extremity use.  Physical therapy treatment session today consisted of completing assessment of goals and administration of testing as demonstrated in flow sheet. Addition treatments may be found below.                         PT Education - 04/17/21 0933     Education Details Progress with therapy interventions thus far    Person(s) Educated Patient    Methods Explanation;Demonstration;Tactile cues  PT Short Term Goals - 04/17/21 0934       PT SHORT TERM GOAL #1   Title Patient will be independent in home exercise program to improve strength/mobility for better functional independence with ADLs.    Baseline 10/11 HEP given    Time 4    Period Weeks    Status New    Target Date 03/24/21      PT SHORT TERM GOAL #2   Title Patient will report no falls in past two weeks indicating improved stability and decreased fall risk.    Baseline 10/11; multiple falls 12/2: fall within this week (reports decrease in frequency)    Time 4    Period Weeks    Status New    Target Date 03/24/21               PT Long Term Goals - 04/17/21 0942       PT LONG TERM GOAL #1   Title Patient will increase FOTO score to equal to or greater than  58%   to demonstrate statistically significant improvement in mobility and quality of life.    Baseline 10/11: 41%, 04/17/21: 53    Time 12    Period Weeks    Status On-going    Target Date 06/12/21      PT LONG TERM GOAL #2   Title Patient will increase ABC scale score >60% to demonstrate better functional mobility and better confidence with ADLs.     Baseline 10/11: 32.5% 12/2:50.1%    Time 12    Period Weeks    Status On-going    Target Date 05/19/21      PT LONG TERM GOAL #3   Title Patient will increase Berg Balance score by > 6 points (21/56)  to demonstrate decreased fall risk during functional activities.    Baseline 10/11: 15/56    Time 12    Period Weeks    Status Revised    Target Date 05/19/21      PT LONG TERM GOAL #4   Title Patient (> 30 years old) will complete five times sit to stand test in < 15 seconds indicating an increased LE strength and improved balance.    Baseline 10/11: unable  to perform from standard height chair, 24 inches 24.52    Time 12    Period Weeks    Status On-going    Target Date 05/19/21      PT LONG TERM GOAL #5   Title Patient will increase BLE gross strength to 4+/5 as to improve functional strength for independent gait, increased standing tolerance and increased ADL ability.    Baseline 10/11: see note    Time 12    Period Weeks    Status On-going    Target Date 05/19/21                   Plan - 04/17/21 0934     Clinical Impression Statement Pt presents to PT for progress note following 10 physical therapy visits.  Patient demonstrates significant improvement in several aspects of lower extremity strength, balance, and overall function.  Patient reports less frequency of falling although did have a fall earlier this week.  Patient returns improvement in [place] balance scale as demonstrated in attached flowsheet indicating decreased risk of falls.  Patient also demonstrates significant improvement in lower extremity strength bilaterally on the right status of demonstrates greater weakness compared to the left.  Patient is still able to perform  sit to stands from standard height chair, however, patient is 6 foot 9 and was able to complete several sit to stands from raised service during 5 times sit to stand test today.  Patient will continue to benefit from skilled physical therapy intervention in order to improve his lower extremity strength, function, prevent falls, and improve overall quality of life.    Personal Factors and Comorbidities Age;Comorbidity 3+;Finances;Fitness;Past/Current Experience;Social Background;Time since onset of injury/illness/exacerbation;Transportation    Comorbidities leep apnea, HTN, aortic valve disease, GERD, genituourinary malignancy; renal, hydrocephalus s/p shunt placed 03/2020, DVT, Type II DM, obesity, OA, covid, depression, diabetic polyneuropathy, AD, pressure injury of right foot, and falls     Examination-Activity Limitations Bathing;Bed Mobility;Bend;Caring for Others;Carry;Dressing;Hygiene/Grooming;Stairs;Squat;Sit;Reach Overhead;Locomotion Level;Lift;Stand;Toileting;Transfers    Examination-Participation Restrictions Cleaning;Community Activity;Driving;Interpersonal Relationship;Laundry;Shop;Personal Finances;Occupation;Meal Prep;Volunteer;Yard Work    Conservation officer, historic buildings Evolving/Moderate complexity    Rehab Potential Fair    PT Frequency 2x / week    PT Duration 12 weeks    PT Treatment/Interventions ADLs/Self Care Home Management;Aquatic Therapy;Biofeedback;Electrical Stimulation;Traction;Functional mobility training;Stair training;Gait training;DME Instruction;Ultrasound;Moist Heat;Therapeutic activities;Therapeutic exercise;Neuromuscular re-education;Balance training;Manual techniques;Orthotic Fit/Training;Patient/family education;Compression bandaging;Passive range of motion;Dry needling;Vestibular;Taping;Vasopneumatic Device;Energy conservation;Manual lymph drainage;Canalith Repostioning;Visual/perceptual remediation/compensation    PT Next Visit Plan standing balance,Progressive LE strengthening- Focus on eccentric control    PT Home Exercise Plan No changes    Consulted and Agree with Plan of Care Patient             Patient will benefit from skilled therapeutic intervention in order to improve the following deficits and impairments:  Abnormal gait, Decreased activity tolerance, Decreased balance, Decreased knowledge of precautions, Decreased endurance, Decreased coordination, Decreased mobility, Decreased range of motion, Difficulty walking, Decreased strength, Hypomobility, Impaired flexibility, Impaired perceived functional ability, Impaired sensation, Postural dysfunction, Improper body mechanics, Cardiopulmonary status limiting activity, Impaired UE functional use, Obesity, Pain  Visit Diagnosis: Abnormality of gait and mobility  Difficulty in walking,  not elsewhere classified  Unsteadiness on feet  Muscle weakness (generalized)     Problem List Patient Active Problem List   Diagnosis Date Noted   Acute diastolic CHF (congestive heart failure) (HCC) 08/17/2019   Edema    Acute metabolic encephalopathy 08/12/2019   Acute pulmonary edema (HCC) 08/12/2019   Acute cor pulmonale (HCC) 08/11/2019   Oxygen desaturation 08/08/2019   S/P right rotator cuff repair 08/07/2019   Hypoxia 10/22/2018   Infection of right prepatellar bursa 10/18/2018   Sepsis (HCC) 10/18/2018   Chest congestion 07/20/2017   Viral illness 07/20/2017   Lower respiratory infection 07/12/2017   Laryngitis, acute 07/12/2017   Cough 07/12/2017   Pleurisy 07/12/2017   Depression 06/03/2015   HTN (hypertension) 01/02/2015   Hyperlipidemia 01/02/2015   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 [place], [place], [address] Phone: [phone]   Fax:  [phone]  Name: Nathan Dean MRN: [id number] Date of Birth: [birth year]

## 2021-04-20 ENCOUNTER — Ambulatory Visit: Payer: Medicare Other | Admitting: Physical Therapy

## 2021-04-20 ENCOUNTER — Other Ambulatory Visit: Payer: Self-pay

## 2021-04-20 DIAGNOSIS — R278 Other lack of coordination: Secondary | ICD-10-CM

## 2021-04-20 DIAGNOSIS — R2689 Other abnormalities of gait and mobility: Secondary | ICD-10-CM

## 2021-04-20 DIAGNOSIS — R269 Unspecified abnormalities of gait and mobility: Secondary | ICD-10-CM

## 2021-04-20 DIAGNOSIS — R2681 Unsteadiness on feet: Secondary | ICD-10-CM

## 2021-04-20 DIAGNOSIS — R262 Difficulty in walking, not elsewhere classified: Secondary | ICD-10-CM

## 2021-04-20 DIAGNOSIS — M6281 Muscle weakness (generalized): Secondary | ICD-10-CM

## 2021-04-20 NOTE — Therapy (Signed)
[facility] MAIN [facility] SERVICES [address] [place], [place], [address] Phone: [phone]   Fax:  [phone]  Physical Therapy Treatment  Patient Details  Name: Nathan Dean MRN: [id number] Date of Birth: 01/19/1956 Referring Provider (PT): Vernard Gambles [place]   Encounter Date: 04/20/2021   PT End of Session - 04/20/21 1338     Visit Number 11    Number of Visits 24    Date for PT Re-Evaluation 05/19/21    Authorization Type 1/10 eval 02/24/21    Progress Note Due on Visit 10    PT Start Time 1146    PT Stop Time 1225    PT Time Calculation (min) 39 min    Equipment Utilized During Treatment Gait belt    Activity Tolerance Patient tolerated treatment well;Patient limited by fatigue    Behavior During Therapy WFL for tasks assessed/performed             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    Diabetes (HCC)    GERD (gastroesophageal reflux disease)    Hyperlipidemia    Hypertension    Obesity    Sleep apnea     Past Surgical History:  Procedure Laterality Date   BACK SURGERY     in the 80's -herniated disc   BACK SURGERY     L4 bone spur 1990   CARPAL TUNNEL RELEASE Right 12/28/2016   Procedure: RIGHT ULNAR AND MEDIAN NEUROPLASTY AT WRIST;  Surgeon: Mack Hook, MD;  Location: [facility];  Service: Orthopedics;  Laterality: Right;   CARPAL TUNNEL RELEASE Left    25 years ago   CATARACT EXTRACTION W/ INTRAOCULAR LENS IMPLANT Bilateral    REPLACEMENT TOTAL KNEE BILATERAL  2014   left 2012 rt 2014   SHOULDER OPEN ROTATOR CUFF REPAIR Right 08/07/2019   Procedure: ROTATOR CUFF REPAIR SHOULDER OPEN;  Surgeon: Juanell Fairly, MD;  Location: [facility];  Service: Orthopedics;  Laterality: Right;   TONSILLECTOMY      There were no vitals filed for this visit.   Subjective Assessment - 04/20/21 1158     Subjective Patient reports  increased BUE shoulder pain especially lateral shoulder which he feels is related to putting pressure on rollator. He has an assessment scheduled with orthopedic either this week or next week;    Pertinent History Patient has seen this therapist in 2019. Was being treated at [facility] clinic but is transferring to this clinic for balance training. PMH includes sleep apnea, HTN, aortic valve disease, GERD, genituourinary malignancy; renal, hydrocephalus s/p shunt placed 03/2020, DVT, Type II DM, obesity, OA, covid, depression, diabetic polyneuropathy, AD, pressure injury of right foot, and falls. Patient had R rotator cuff surgery a year and a half and has not had a good recovery process.  Patient has a history of falling prior to COVID, falls decreased after shunt but still are present. Will see a podiatrist soon for his pressure injury on R foot. Referral is for diabetic polyneuropathy, foot drop R foot, and falling. Patient reports >30 falls in past 6 months.    Limitations Sitting;Standing;Walking;House hold activities;Other (comment)    How long can you sit comfortably? a few hours    How long can you stand comfortably? immediately unstable    How long can you walk comfortably? immediately unstable    Patient Stated Goals improve leg strength. more independent    Currently in Pain? Yes  Pain Score 8     Pain Location Shoulder    Pain Orientation Right    Pain Descriptors / Indicators Aching;Sore    Pain Type Chronic pain    Pain Onset More than a month ago    Pain Frequency Intermittent    Aggravating Factors  weight bearing/standing    Pain Relieving Factors rest    Effect of Pain on Daily Activities decreased activity tolerance;    Multiple Pain Sites No    Pain Onset In the past 7 days                Therex:   Nustep Interval training-  Level 1- 1 min Level 5- 30 sec  Level 1 - 1 min Level 6 - 30 sec Level 1- 1 min Level 7- 30 sec  Level 1 - 1 min Level 7- 30 sec LE  only due to some R shoulder pain  Required min A for getting on/off equipment, including placing foot on machine;     Leg press activity in [facility] gym.  Patient required contact-guard assist and cues for sequencing for getting on and off of the machine. Double leg -Patient performed 15 reps at level 6, 15 reps at level 8, and 12 reps  at level 9, -Had seat at maximum height pushed back so patient could fit due to his high height.  Even with this patient still required assistance getting on due to his height. - Min A with LE placement, PT assisted with maintenance of LE positioning during completion of exercise     Pt had times rest break following exercises to ensure adequate recovery for ambulation and STS following        Pt educated throughout session about proper posture and technique with exercises. Improved exercise technique, movement at target joints, use of target muscles after min to mod verbal, visual, tactile cues.                       PT Education - 04/20/21 1338     Education Details LE strengthening/positioning;    Person(s) Educated Patient    Methods Explanation;Verbal cues    Comprehension Need further instruction;Verbalized understanding;Returned demonstration;Verbal cues required              PT Short Term Goals - 04/17/21 0934       PT SHORT TERM GOAL #1   Title Patient will be independent in home exercise program to improve strength/mobility for better functional independence with ADLs.    Baseline 10/11 HEP given    Time 4    Period Weeks    Status New    Target Date 03/24/21      PT SHORT TERM GOAL #2   Title Patient will report no falls in past two weeks indicating improved stability and decreased fall risk.    Baseline 10/11; multiple falls 12/2: fall within this week (reports decrease in frequency)    Time 4    Period Weeks    Status New    Target Date 03/24/21               PT Long Term Goals - 04/17/21 0942        PT LONG TERM GOAL #1   Title Patient will increase FOTO score to equal to or greater than  58%   to demonstrate statistically significant improvement in mobility and quality of life.    Baseline 10/11: 41%, 04/17/21: 53  Time 12    Period Weeks    Status On-going    Target Date 06/12/21      PT LONG TERM GOAL #2   Title Patient will increase ABC scale score >60% to demonstrate better functional mobility and better confidence with ADLs.     Baseline 10/11: 32.5% 12/2:50.1%    Time 12    Period Weeks    Status On-going    Target Date 05/19/21      PT LONG TERM GOAL #3   Title Patient will increase Berg Balance score by > 6 points (21/56)  to demonstrate decreased fall risk during functional activities.    Baseline 10/11: 15/56    Time 12    Period Weeks    Status Revised    Target Date 05/19/21      PT LONG TERM GOAL #4   Title Patient (> 51 years old) will complete five times sit to stand test in < 15 seconds indicating an increased LE strength and improved balance.    Baseline 10/11: unable to perform from standard height chair, 24 inches 24.52    Time 12    Period Weeks    Status On-going    Target Date 05/19/21      PT LONG TERM GOAL #5   Title Patient will increase BLE gross strength to 4+/5 as to improve functional strength for independent gait, increased standing tolerance and increased ADL ability.    Baseline 10/11: see note    Time 12    Period Weeks    Status On-going    Target Date 05/19/21                   Plan - 04/20/21 1339     Clinical Impression Statement Patient motivated and participated fair within session. He was able to exhibit better tolerance on leg press being able to get legs on/off machine easier. He is still limited in equipment that he can use due to size and height. Patient does report increased RUE shoulder pain this session. He believes this is coming from pushing on walker. He requested to not do hamstring/leg extension machine  this session. He does require min A for transfers from low surfaces with increased time/effort required. Patient would benefit from additional skilled PT Intervention to improve strength, balance and mobility; Patient plans to follow up with orthopedic MD regarding shoulder pain later this week.    Personal Factors and Comorbidities Age;Comorbidity 3+;Finances;Fitness;Past/Current Experience;Social Background;Time since onset of injury/illness/exacerbation;Transportation    Comorbidities leep apnea, HTN, aortic valve disease, GERD, genituourinary malignancy; renal, hydrocephalus s/p shunt placed 03/2020, DVT, Type II DM, obesity, OA, covid, depression, diabetic polyneuropathy, AD, pressure injury of right foot, and falls    Examination-Activity Limitations Bathing;Bed Mobility;Bend;Caring for Others;Carry;Dressing;Hygiene/Grooming;Stairs;Squat;Sit;Reach Overhead;Locomotion Level;Lift;Stand;Toileting;Transfers    Examination-Participation Restrictions Cleaning;Community Activity;Driving;Interpersonal Relationship;Laundry;Shop;Personal Finances;Occupation;Meal Prep;Volunteer;Yard Work    Conservation officer, historic buildings Evolving/Moderate complexity    Rehab Potential Fair    PT Frequency 2x / week    PT Duration 12 weeks    PT Treatment/Interventions ADLs/Self Care Home Management;Aquatic Therapy;Biofeedback;Electrical Stimulation;Traction;Functional mobility training;Stair training;Gait training;DME Instruction;Ultrasound;Moist Heat;Therapeutic activities;Therapeutic exercise;Neuromuscular re-education;Balance training;Manual techniques;Orthotic Fit/Training;Patient/family education;Compression bandaging;Passive range of motion;Dry needling;Vestibular;Taping;Vasopneumatic Device;Energy conservation;Manual lymph drainage;Canalith Repostioning;Visual/perceptual remediation/compensation    PT Next Visit Plan standing balance,Progressive LE strengthening- Focus on eccentric control    PT Home Exercise Plan  No changes    Consulted and Agree with Plan of Care Patient  Patient will benefit from skilled therapeutic intervention in order to improve the following deficits and impairments:  Abnormal gait, Decreased activity tolerance, Decreased balance, Decreased knowledge of precautions, Decreased endurance, Decreased coordination, Decreased mobility, Decreased range of motion, Difficulty walking, Decreased strength, Hypomobility, Impaired flexibility, Impaired perceived functional ability, Impaired sensation, Postural dysfunction, Improper body mechanics, Cardiopulmonary status limiting activity, Impaired UE functional use, Obesity, Pain  Visit Diagnosis: Abnormality of gait and mobility  Difficulty in walking, not elsewhere classified  Unsteadiness on feet  Muscle weakness (generalized)  Other lack of coordination  Other abnormalities of gait and mobility     Problem List Patient Active Problem List   Diagnosis Date Noted   Acute diastolic CHF (congestive heart failure) (HCC) 08/17/2019   Edema    Acute metabolic encephalopathy 08/12/2019   Acute pulmonary edema (HCC) 08/12/2019   Acute cor pulmonale (HCC) 08/11/2019   Oxygen desaturation 08/08/2019   S/P right rotator cuff repair 08/07/2019   Hypoxia 10/22/2018   Infection of right prepatellar bursa 10/18/2018   Sepsis (HCC) 10/18/2018   Chest congestion 07/20/2017   Viral illness 07/20/2017   Lower respiratory infection 07/12/2017   Laryngitis, acute 07/12/2017   Cough 07/12/2017   Pleurisy 07/12/2017   Depression 06/03/2015   HTN (hypertension) 01/02/2015   Hyperlipidemia 01/02/2015   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 [place], [place], [address] Phone: [phone]   Fax:  [phone]  Name: Terril Chestnut MRN: [id number] Date of Birth: Oct 12, 1955

## 2021-04-23 ENCOUNTER — Other Ambulatory Visit: Payer: Self-pay

## 2021-04-23 ENCOUNTER — Ambulatory Visit: Payer: Medicare Other | Admitting: Physical Therapy

## 2021-04-23 DIAGNOSIS — M6281 Muscle weakness (generalized): Secondary | ICD-10-CM

## 2021-04-23 DIAGNOSIS — R269 Unspecified abnormalities of gait and mobility: Secondary | ICD-10-CM | POA: Diagnosis not present

## 2021-04-23 DIAGNOSIS — R2681 Unsteadiness on feet: Secondary | ICD-10-CM

## 2021-04-23 DIAGNOSIS — R262 Difficulty in walking, not elsewhere classified: Secondary | ICD-10-CM

## 2021-04-23 NOTE — Therapy (Addendum)
[facility] MAIN [facility] SERVICES [address] [place], [place], [address] Phone: [phone]   Fax:  [phone]  Physical Therapy Treatment  Patient Details  Name: Nathan Dean MRN: [id number] Date of Birth: 1955/08/17 Referring Provider (PT): Vernard Gambles [place]   Encounter Date: 04/23/2021   PT End of Session - 04/23/21 1111     Visit Number 12    Number of Visits 24    Date for PT Re-Evaluation 05/19/21    Authorization Type 1/10 eval 02/24/21    Progress Note Due on Visit 10    PT Start Time 1103    PT Stop Time 1144    PT Time Calculation (min) 41 min    Equipment Utilized During Treatment Gait belt    Activity Tolerance Patient tolerated treatment well;Patient limited by fatigue    Behavior During Therapy WFL for tasks assessed/performed             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    Diabetes (HCC)    GERD (gastroesophageal reflux disease)    Hyperlipidemia    Hypertension    Obesity    Sleep apnea     Past Surgical History:  Procedure Laterality Date   BACK SURGERY     in the 80's -herniated disc   BACK SURGERY     L4 bone spur 1990   CARPAL TUNNEL RELEASE Right 12/28/2016   Procedure: RIGHT ULNAR AND MEDIAN NEUROPLASTY AT WRIST;  Surgeon: Mack Hook, MD;  Location: [facility];  Service: Orthopedics;  Laterality: Right;   CARPAL TUNNEL RELEASE Left    25 years ago   CATARACT EXTRACTION W/ INTRAOCULAR LENS IMPLANT Bilateral    REPLACEMENT TOTAL KNEE BILATERAL  2014   left 2012 rt 2014   SHOULDER OPEN ROTATOR CUFF REPAIR Right 08/07/2019   Procedure: ROTATOR CUFF REPAIR SHOULDER OPEN;  Surgeon: Juanell Fairly, MD;  Location: [facility];  Service: Orthopedics;  Laterality: Right;   TONSILLECTOMY      There were no vitals filed for this visit.   Subjective Assessment - 04/23/21 1052     Subjective Patient reports  pain in his shoulders.    Pertinent History Patient has seen this therapist in 2019. Was being treated at [facility] clinic but is transferring to this clinic for balance training. PMH includes sleep apnea, HTN, aortic valve disease, GERD, genituourinary malignancy; renal, hydrocephalus s/p shunt placed 03/2020, DVT, Type II DM, obesity, OA, covid, depression, diabetic polyneuropathy, AD, pressure injury of right foot, and falls. Patient had R rotator cuff surgery a year and a half and has not had a good recovery process.  Patient has a history of falling prior to COVID, falls decreased after shunt but still are present. Will see a podiatrist soon for his pressure injury on R foot. Referral is for diabetic polyneuropathy, foot drop R foot, and falling. Patient reports >30 falls in past 6 months.    Limitations Sitting;Standing;Walking;House hold activities;Other (comment)    How long can you sit comfortably? a few hours    How long can you stand comfortably? immediately unstable    How long can you walk comfortably? immediately unstable    Patient Stated Goals improve leg strength. more independent    Currently in Pain? Yes    Pain Score 7     Pain Location Shoulder    Pain Onset More than a month ago  Pain Onset In the past 7 days               Exercise/Activity Sets/Reps/Time/ Resistance Assistance Charge type Comments  Leg press DL 5xL6 [id number] [id number] minA with positioning Therex Assistance with LE positioning In wellzone  Leg press SL 10x L4 on ea LE minA with positioning   therex In [facility]   HS curl 10xL6 10xL8 10xL9 10xL10 minA with positioning   therex   Setup assistance to accommodate his size  In wellzone   Knee extension machine 10 x L 5  10x L 6  10 x 5 sec hold at end range L2 minA with positioning   therex   Setup assistance to accommodate his size  In wellzone   Interval training on nustep  L1 for rest  L5 for interval (30 sec) minA with positioning of LE     therex   In wellzone   Standing at balance bar - marching  X 10  L UE assist  CGA Therex                                  Treatment Provided this session   Pt educated throughout session about proper posture and technique with exercises. Improved exercise technique, movement at target joints, use of target muscles after min to mod verbal, visual, tactile cues.  Note: Portions of this document were prepared using Dragon voice recognition software and although reviewed may contain unintentional dictation errors in syntax, grammar, or spelling.   Pt required occasional rest breaks due fatigue, PT was quick to ask when pt appeared to be fatiguing in order to prevent excessive fatigue.                         PT Education - 04/23/21 1109     Education Details LE strength and positioning    Person(s) Educated Patient    Methods Explanation;Verbal cues    Comprehension Verbalized understanding;Verbal cues required;Returned demonstration              PT Short Term Goals - 04/17/21 0934       PT SHORT TERM GOAL #1   Title Patient will be independent in home exercise program to improve strength/mobility for better functional independence with ADLs.    Baseline 10/11 HEP given    Time 4    Period Weeks    Status New    Target Date 03/24/21      PT SHORT TERM GOAL #2   Title Patient will report no falls in past two weeks indicating improved stability and decreased fall risk.    Baseline 10/11; multiple falls 12/2: fall within this week (reports decrease in frequency)    Time 4    Period Weeks    Status New    Target Date 03/24/21               PT Long Term Goals - 04/17/21 0942       PT LONG TERM GOAL #1   Title Patient will increase FOTO score to equal to or greater than  58%   to demonstrate statistically significant improvement in mobility and quality of life.    Baseline 10/11: 41%, 04/17/21: 53    Time 12    Period Weeks    Status  On-going    Target Date 06/12/21      PT LONG TERM  GOAL #2   Title Patient will increase ABC scale score >60% to demonstrate better functional mobility and better confidence with ADLs.     Baseline 10/11: 32.5% 12/2:50.1%    Time 12    Period Weeks    Status On-going    Target Date 05/19/21      PT LONG TERM GOAL #3   Title Patient will increase Berg Balance score by > 6 points (21/56)  to demonstrate decreased fall risk during functional activities.    Baseline 10/11: 15/56    Time 12    Period Weeks    Status Revised    Target Date 05/19/21      PT LONG TERM GOAL #4   Title Patient (> 61 years old) will complete five times sit to stand test in < 15 seconds indicating an increased LE strength and improved balance.    Baseline 10/11: unable to perform from standard height chair, 24 inches 24.52    Time 12    Period Weeks    Status On-going    Target Date 05/19/21      PT LONG TERM GOAL #5   Title Patient will increase BLE gross strength to 4+/5 as to improve functional strength for independent gait, increased standing tolerance and increased ADL ability.    Baseline 10/11: see note    Time 12    Period Weeks    Status On-going    Target Date 05/19/21                Clinical Impression Statement : Patient motivated and participated fair within session. He was able to exhibit better tolerance on leg press being able to get legs on/off machine easier. He is still limited in equipment that he can use due to size and height. Patient does report increased RUE shoulder pain this session. Patient would benefit from additional skilled PT Intervention to improve strength, balance and mobility; Patient plans to follow up with orthopedic MD regarding shoulder pain later this week.   Plan - 04/23/21 1113     Personal Factors and Comorbidities Age;Comorbidity 3+;Finances;Fitness;Past/Current Experience;Social Background;Time since onset of injury/illness/exacerbation;Transportation     Comorbidities leep apnea, HTN, aortic valve disease, GERD, genituourinary malignancy; renal, hydrocephalus s/p shunt placed 03/2020, DVT, Type II DM, obesity, OA, covid, depression, diabetic polyneuropathy, AD, pressure injury of right foot, and falls    Examination-Activity Limitations Bathing;Bed Mobility;Bend;Caring for Others;Carry;Dressing;Hygiene/Grooming;Stairs;Squat;Sit;Reach Overhead;Locomotion Level;Lift;Stand;Toileting;Transfers    Examination-Participation Restrictions Cleaning;Community Activity;Driving;Interpersonal Relationship;Laundry;Shop;Personal Finances;Occupation;Meal Prep;Volunteer;Yard Work    Conservation officer, historic buildings Evolving/Moderate complexity    Rehab Potential Fair    PT Frequency 2x / week    PT Duration 12 weeks    PT Treatment/Interventions ADLs/Self Care Home Management;Aquatic Therapy;Biofeedback;Electrical Stimulation;Traction;Functional mobility training;Stair training;Gait training;DME Instruction;Ultrasound;Moist Heat;Therapeutic activities;Therapeutic exercise;Neuromuscular re-education;Balance training;Manual techniques;Orthotic Fit/Training;Patient/family education;Compression bandaging;Passive range of motion;Dry needling;Vestibular;Taping;Vasopneumatic Device;Energy conservation;Manual lymph drainage;Canalith Repostioning;Visual/perceptual remediation/compensation    PT Next Visit Plan standing balance,Progressive LE strengthening- Focus on eccentric control    PT Home Exercise Plan No changes    Consulted and Agree with Plan of Care Patient             Patient will benefit from skilled therapeutic intervention in order to improve the following deficits and impairments:  Abnormal gait, Decreased activity tolerance, Decreased balance, Decreased knowledge of precautions, Decreased endurance, Decreased coordination, Decreased mobility, Decreased range of motion, Difficulty walking, Decreased strength, Hypomobility, Impaired flexibility, Impaired  perceived functional ability, Impaired sensation, Postural dysfunction, Improper body mechanics, Cardiopulmonary status limiting activity, Impaired  UE functional use, Obesity, Pain  Visit Diagnosis: No diagnosis found.     Problem List Patient Active Problem List   Diagnosis Date Noted   Acute diastolic CHF (congestive heart failure) (HCC) 08/17/2019   Edema    Acute metabolic encephalopathy 08/12/2019   Acute pulmonary edema (HCC) 08/12/2019   Acute cor pulmonale (HCC) 08/11/2019   Oxygen desaturation 08/08/2019   S/P right rotator cuff repair 08/07/2019   Hypoxia 10/22/2018   Infection of right prepatellar bursa 10/18/2018   Sepsis (HCC) 10/18/2018   Chest congestion 07/20/2017   Viral illness 07/20/2017   Lower respiratory infection 07/12/2017   Laryngitis, acute 07/12/2017   Cough 07/12/2017   Pleurisy 07/12/2017   Depression 06/03/2015   HTN (hypertension) 01/02/2015   Hyperlipidemia 01/02/2015   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 [place], [place], [address] Phone: [phone]   Fax:  [phone]  Name: Jhair Witherington MRN: [id number] Date of Birth: August 27, 1955

## 2021-04-24 ENCOUNTER — Other Ambulatory Visit: Payer: Self-pay | Admitting: Orthopedic Surgery

## 2021-04-24 ENCOUNTER — Ambulatory Visit: Payer: Medicare Other | Admitting: Podiatry

## 2021-04-24 DIAGNOSIS — M19011 Primary osteoarthritis, right shoulder: Secondary | ICD-10-CM

## 2021-04-29 ENCOUNTER — Ambulatory Visit: Payer: Medicare Other | Admitting: Physical Therapy

## 2021-04-29 ENCOUNTER — Other Ambulatory Visit: Payer: Self-pay

## 2021-04-29 DIAGNOSIS — R269 Unspecified abnormalities of gait and mobility: Secondary | ICD-10-CM

## 2021-04-29 DIAGNOSIS — R262 Difficulty in walking, not elsewhere classified: Secondary | ICD-10-CM

## 2021-04-29 DIAGNOSIS — R2681 Unsteadiness on feet: Secondary | ICD-10-CM

## 2021-04-29 DIAGNOSIS — M6281 Muscle weakness (generalized): Secondary | ICD-10-CM

## 2021-04-30 NOTE — Therapy (Signed)
[place] [facility] MAIN [facility] SERVICES [address] [place], [place], [address] Phone: [phone]   Fax:  [phone]  Physical Therapy Treatment  Patient Details  Name: Nathan Dean MRN: [id number] Date of Birth: 06-Nov-1955 Referring Provider (PT): Vernard Gambles [place]   Encounter Date: 04/29/2021   PT End of Session - 04/29/21 0924     Visit Number 13    Number of Visits 24    Date for PT Re-Evaluation 05/19/21    Authorization Type 1/10 eval 02/24/21    Progress Note Due on Visit 10    PT Start Time 0802    PT Stop Time 0835    PT Time Calculation (min) 33 min    Equipment Utilized During Treatment Gait belt    Activity Tolerance Patient tolerated treatment well;Patient limited by fatigue    Behavior During Therapy WFL for tasks assessed/performed             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    Diabetes (HCC)    GERD (gastroesophageal reflux disease)    Hyperlipidemia    Hypertension    Obesity    Sleep apnea     Past Surgical History:  Procedure Laterality Date   BACK SURGERY     in the 80's -herniated disc   BACK SURGERY     L4 bone spur 1990   CARPAL TUNNEL RELEASE Right 12/28/2016   Procedure: RIGHT ULNAR AND MEDIAN NEUROPLASTY AT WRIST;  Surgeon: Mack Hook, MD;  Location: [facility];  Service: Orthopedics;  Laterality: Right;   CARPAL TUNNEL RELEASE Left    25 years ago   CATARACT EXTRACTION W/ INTRAOCULAR LENS IMPLANT Bilateral    REPLACEMENT TOTAL KNEE BILATERAL  2014   left 2012 rt 2014   SHOULDER OPEN ROTATOR CUFF REPAIR Right 08/07/2019   Procedure: ROTATOR CUFF REPAIR SHOULDER OPEN;  Surgeon: Juanell Fairly, MD;  Location: [facility];  Service: Orthopedics;  Laterality: Right;   TONSILLECTOMY      There were no vitals filed for this visit.   Subjective Assessment - 04/29/21 0922     Subjective Pt reports he had  a fall in gas station parking lot a few days ago. Sustained injury to his finger but no other known injuries. Pt also explained regarding his R UE shoulder surgical options and how MRI was next step prior to further surgery.    Pertinent History Patient has seen this therapist in 2019. Was being treated at [facility] clinic but is transferring to this clinic for balance training. PMH includes sleep apnea, HTN, aortic valve disease, GERD, genituourinary malignancy; renal, hydrocephalus s/p shunt placed 03/2020, DVT, Type II DM, obesity, OA, covid, depression, diabetic polyneuropathy, AD, pressure injury of right foot, and falls. Patient had R rotator cuff surgery a year and a half and has not had a good recovery process.  Patient has a history of falling prior to COVID, falls decreased after shunt but still are present. Will see a podiatrist soon for his pressure injury on R foot. Referral is for diabetic polyneuropathy, foot drop R foot, and falling. Patient reports >30 falls in past 6 months.    Limitations Sitting;Standing;Walking;House hold activities;Other (comment)    How long can you sit comfortably? a few hours    How long can you stand comfortably? immediately unstable    How long can you walk comfortably? immediately unstable    Patient Stated  Goals improve leg strength. more independent    Pain Score 7     Pain Location Shoulder    Pain Orientation Right    Pain Descriptors / Indicators Aching;Sore    Pain Type Chronic pain    Pain Onset More than a month ago    Pain Frequency Intermittent    Pain Onset In the past 7 days             Completed Nustep activity at intervals between level 1 and level 5 with 1 min rest interval and 30 second high intensity intervals. (6 min total)  Following this pt transitioned pt from sitting on nustep to standing. Pt was directed to leg press machine. When transitioning to leg press pt L LE got caught on the leg press and pt lost his balance. Pt was  holding on to leg press with bilateral UE so we was able to control his descent for a moment but then preceded to fall. Pt did not sustain any injuries per his report and pt did not his his head or anything else upon this fall. See clinical impression statement for further details regarding this.  Following helping pt transition to sitting in chair and on rollator pt was educated regarding sequencing and reviewing things that may have caused the fall.                           PT Education - 04/29/21 [phone]     Education Details Safety with tasks including planning out complex motor tasks to prevent LOB or falls.    Person(s) Educated Patient    Methods Explanation    Comprehension Verbalized understanding              PT Short Term Goals - 04/17/21 0934       PT SHORT TERM GOAL #1   Title Patient will be independent in home exercise program to improve strength/mobility for better functional independence with ADLs.    Baseline 10/11 HEP given    Time 4    Period Weeks    Status New    Target Date 03/24/21      PT SHORT TERM GOAL #2   Title Patient will report no falls in past two weeks indicating improved stability and decreased fall risk.    Baseline 10/11; multiple falls 12/2: fall within this week (reports decrease in frequency)    Time 4    Period Weeks    Status New    Target Date 03/24/21               PT Long Term Goals - 04/17/21 0942       PT LONG TERM GOAL #1   Title Patient will increase FOTO score to equal to or greater than  58%   to demonstrate statistically significant improvement in mobility and quality of life.    Baseline 10/11: 41%, 04/17/21: 53    Time 12    Period Weeks    Status On-going    Target Date 06/12/21      PT LONG TERM GOAL #2   Title Patient will increase ABC scale score >60% to demonstrate better functional mobility and better confidence with ADLs.     Baseline 10/11: 32.5% 12/2:50.1%    Time 12    Period  Weeks    Status On-going    Target Date 05/19/21      PT LONG TERM GOAL #3  Title Patient will increase Berg Balance score by > 6 points (21/56)  to demonstrate decreased fall risk during functional activities.    Baseline 10/11: 15/56    Time 12    Period Weeks    Status Revised    Target Date 05/19/21      PT LONG TERM GOAL #4   Title Patient (> 19 years old) will complete five times sit to stand test in < 15 seconds indicating an increased LE strength and improved balance.    Baseline 10/11: unable to perform from standard height chair, 24 inches 24.52    Time 12    Period Weeks    Status On-going    Target Date 05/19/21      PT LONG TERM GOAL #5   Title Patient will increase BLE gross strength to 4+/5 as to improve functional strength for independent gait, increased standing tolerance and increased ADL ability.    Baseline 10/11: see note    Time 12    Period Weeks    Status On-going    Target Date 05/19/21                   Plan - 04/29/21 0925     Clinical Impression Statement Patient was in physical therapy treatment session with author in [employer] gym due to patients increased height pt is unable to utilize equipment in physical therapy gym. Pt was transitioning from standing with rollator to the leg press machine to perform an exercise and was attempting to place his LE on the opposite side of the machine in order to position himself properly. When making this transition patient's foot caught on the weight machine and he was unable to regain his equilibrium following. He fell to the floor and was assisted into long sitting with the aid of others in the gym. Pt was asked regarding any pain, lightheadedness or injuries and he reported he did not have any pain, and nothing was hurting at that time. Pt was then being watched by staff in [employer] and Therapist made sure patient was okay, alert and oriented. Therapist recruited help of another therapist and using steps  and chair in [employer] were able to assist with max assist x 2 to sitting. Following transition to sitting patient had mild complaints of right shoulder pain (which he also had prior to fall). Following transition to chair patient was assisted to standing with ModA x 2 and then transitioned to seated on his rollator. Pt rested here and was reassessed for pain or other new symptoms. Following this patient was able to transition to standing and ambulate out of clinic with Rollator. Pt was instructed to contact MD or office if he developed any new signs or symptoms following.    Personal Factors and Comorbidities Age;Comorbidity 3+;Finances;Fitness;Past/Current Experience;Social Background;Time since onset of injury/illness/exacerbation;Transportation    Comorbidities leep apnea, HTN, aortic valve disease, GERD, genituourinary malignancy; renal, hydrocephalus s/p shunt placed 03/2020, DVT, Type II DM, obesity, OA, covid, depression, diabetic polyneuropathy, AD, pressure injury of right foot, and falls    Examination-Activity Limitations Bathing;Bed Mobility;Bend;Caring for Others;Carry;Dressing;Hygiene/Grooming;Stairs;Squat;Sit;Reach Overhead;Locomotion Level;Lift;Stand;Toileting;Transfers    Examination-Participation Restrictions Cleaning;Community Activity;Driving;Interpersonal Relationship;Laundry;Shop;Personal Finances;Occupation;Meal Prep;Volunteer;Yard Work    Conservation officer, historic buildings Evolving/Moderate complexity    Rehab Potential Fair    PT Frequency 2x / week    PT Duration 12 weeks    PT Treatment/Interventions ADLs/Self Care Home Management;Aquatic Therapy;Biofeedback;Electrical Stimulation;Traction;Functional mobility training;Stair training;Gait training;DME Instruction;Ultrasound;Moist Heat;Therapeutic activities;Therapeutic exercise;Neuromuscular re-education;Balance training;Manual techniques;Orthotic Fit/Training;Patient/family education;Compression bandaging;Passive range  of  motion;Dry needling;Vestibular;Taping;Vasopneumatic Device;Energy conservation;Manual lymph drainage;Canalith Repostioning;Visual/perceptual remediation/compensation    PT Next Visit Plan standing balance,Progressive LE strengthening- Focus on eccentric control    PT Home Exercise Plan No changes    Consulted and Agree with Plan of Care Patient             Patient will benefit from skilled therapeutic intervention in order to improve the following deficits and impairments:  Abnormal gait, Decreased activity tolerance, Decreased balance, Decreased knowledge of precautions, Decreased endurance, Decreased coordination, Decreased mobility, Decreased range of motion, Difficulty walking, Decreased strength, Hypomobility, Impaired flexibility, Impaired perceived functional ability, Impaired sensation, Postural dysfunction, Improper body mechanics, Cardiopulmonary status limiting activity, Impaired UE functional use, Obesity, Pain  Visit Diagnosis: Abnormality of gait and mobility  Difficulty in walking, not elsewhere classified  Unsteadiness on feet  Muscle weakness (generalized)     Problem List Patient Active Problem List   Diagnosis Date Noted   Acute diastolic CHF (congestive heart failure) (HCC) 08/17/2019   Edema    Acute metabolic encephalopathy 08/12/2019   Acute pulmonary edema (HCC) 08/12/2019   Acute cor pulmonale (HCC) 08/11/2019   Oxygen desaturation 08/08/2019   S/P right rotator cuff repair 08/07/2019   Hypoxia 10/22/2018   Infection of right prepatellar bursa 10/18/2018   Sepsis (HCC) 10/18/2018   Chest congestion 07/20/2017   Viral illness 07/20/2017   Lower respiratory infection 07/12/2017   Laryngitis, acute 07/12/2017   Cough 07/12/2017   Pleurisy 07/12/2017   Depression 06/03/2015   HTN (hypertension) 01/02/2015   Hyperlipidemia 01/02/2015   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

## 2021-05-01 ENCOUNTER — Ambulatory Visit: Payer: Medicare Other | Admitting: Physical Therapy

## 2021-05-05 ENCOUNTER — Ambulatory Visit: Payer: Medicare Other | Admitting: Physical Therapy

## 2021-05-05 ENCOUNTER — Other Ambulatory Visit: Payer: Self-pay

## 2021-05-05 DIAGNOSIS — R262 Difficulty in walking, not elsewhere classified: Secondary | ICD-10-CM

## 2021-05-05 DIAGNOSIS — M6281 Muscle weakness (generalized): Secondary | ICD-10-CM

## 2021-05-05 DIAGNOSIS — R2681 Unsteadiness on feet: Secondary | ICD-10-CM

## 2021-05-05 DIAGNOSIS — R269 Unspecified abnormalities of gait and mobility: Secondary | ICD-10-CM | POA: Diagnosis not present

## 2021-05-05 NOTE — Therapy (Signed)
[facility] MAIN [facility] SERVICES [address] [place], [place], [address] Phone: [phone]   Fax:  [phone]  Physical Therapy Treatment  Patient Details  Name: Nathan Dean MRN: [id number] Date of Birth: [birth year] Referring Provider (PT): Vernard Gambles [place]   Encounter Date: 05/05/2021   PT End of Session - 05/05/21 0809     Visit Number 14    Number of Visits 24    Date for PT Re-Evaluation 05/19/21    Authorization Type 1/10 eval 02/24/21    Progress Note Due on Visit 10    PT Start Time 0800    PT Stop Time 0841    PT Time Calculation (min) 41 min    Equipment Utilized During Treatment Gait belt    Activity Tolerance Patient tolerated treatment well;Patient limited by fatigue    Behavior During Therapy WFL for tasks assessed/performed             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    Diabetes (HCC)    GERD (gastroesophageal reflux disease)    Hyperlipidemia    Hypertension    Obesity    Sleep apnea     Past Surgical History:  Procedure Laterality Date   BACK SURGERY     in the 80's -herniated disc   BACK SURGERY     L4 bone spur 1990   CARPAL TUNNEL RELEASE Right 12/28/2016   Procedure: RIGHT ULNAR AND MEDIAN NEUROPLASTY AT WRIST;  Surgeon: Mack Hook, MD;  Location: [facility];  Service: Orthopedics;  Laterality: Right;   CARPAL TUNNEL RELEASE Left    25 years ago   CATARACT EXTRACTION W/ INTRAOCULAR LENS IMPLANT Bilateral    REPLACEMENT TOTAL KNEE BILATERAL  2014   left 2012 rt 2014   SHOULDER OPEN ROTATOR CUFF REPAIR Right 08/07/2019   Procedure: ROTATOR CUFF REPAIR SHOULDER OPEN;  Surgeon: Juanell Fairly, MD;  Location: [facility];  Service: Orthopedics;  Laterality: Right;   TONSILLECTOMY      There were no vitals filed for this visit.   Subjective Assessment - 05/05/21 0803     Subjective Pt reports  continued pain in the shoulder. Pt also has some in wrist and hand from fall at gas station over a week ago.    Pertinent History Patient has seen this therapist in 2019. Was being treated at [facility] clinic but is transferring to this clinic for balance training. PMH includes sleep apnea, HTN, aortic valve disease, GERD, genituourinary malignancy; renal, hydrocephalus s/p shunt placed 03/2020, DVT, Type II DM, obesity, OA, covid, depression, diabetic polyneuropathy, AD, pressure injury of right foot, and falls. Patient had R rotator cuff surgery a year and a half and has not had a good recovery process.  Patient has a history of falling prior to COVID, falls decreased after shunt but still are present. Will see a podiatrist soon for his pressure injury on R foot. Referral is for diabetic polyneuropathy, foot drop R foot, and falling. Patient reports >30 falls in past 6 months.    Limitations Sitting;Standing;Walking;House hold activities;Other (comment)    How long can you sit comfortably? a few hours    How long can you stand comfortably? immediately unstable    How long can you walk comfortably? immediately unstable    Patient Stated Goals improve leg strength. more independent    Pain Score 7     Pain Location Shoulder  Pain Orientation Right    Pain Descriptors / Indicators Aching;Sore    Pain Type Chronic pain    Pain Onset More than a month ago    Pain Onset In the past 7 days               Exercise/Activity Sets/Reps/Time/ Resistance Assistance Charge type Comments  STS  3 x 5  Increased mat table height, no hands use on second 2 sets  TherACT Fatigue noted on second set and had to take rest breaks between. NO UE support upon standing.         HS curl 10xL6 10xL8 10xL9 10xL10 minA with positioning   therex   Setup assistance to accommodate his size  In wellzone   Knee extension machine 10 x L 5  10x L 6  10 x 5 sec hold at end range L2 minA with positioning   therex    Setup assistance to accommodate his size  In wellzone   Interval training on nustep  L1 for rest  L5 for interval (30 sec) 6 min total minA with positioning of LE    therex   In wellzone                                       Treatment Provided this session   Pt educated throughout session about proper posture and technique with exercises. Improved exercise technique, movement at target joints, use of target muscles after min to mod verbal, visual, tactile cues.  Note: Portions of this document were prepared using Dragon voice recognition software and although reviewed may contain unintentional dictation errors in syntax, grammar, or spelling.   Pt required occasional rest breaks due fatigue, PT was quick to ask when pt appeared to be fatiguing in order to prevent excessive fatigue.  Following last repetition on knee extension machine pt had some feelings of nausea. Pt provided with trash can but did not vomit. Pt also had some of this nausea and vomiting a few days prior to appointment                           PT Short Term Goals - 04/17/21 0934       PT SHORT TERM GOAL #1   Title Patient will be independent in home exercise program to improve strength/mobility for better functional independence with ADLs.    Baseline 10/11 HEP given    Time 4    Period Weeks    Status New    Target Date 03/24/21      PT SHORT TERM GOAL #2   Title Patient will report no falls in past two weeks indicating improved stability and decreased fall risk.    Baseline 10/11; multiple falls 12/2: fall within this week (reports decrease in frequency)    Time 4    Period Weeks    Status New    Target Date 03/24/21               PT Long Term Goals - 04/17/21 0942       PT LONG TERM GOAL #1   Title Patient will increase FOTO score to equal to or greater than  58%   to demonstrate statistically significant improvement in mobility and quality of life.    Baseline 10/11:  41%, 04/17/21: 53    Time 12  Period Weeks    Status On-going    Target Date 06/12/21      PT LONG TERM GOAL #2   Title Patient will increase ABC scale score >60% to demonstrate better functional mobility and better confidence with ADLs.     Baseline 10/11: 32.5% 12/2:50.1%    Time 12    Period Weeks    Status On-going    Target Date 05/19/21      PT LONG TERM GOAL #3   Title Patient will increase Berg Balance score by > 6 points (21/56)  to demonstrate decreased fall risk during functional activities.    Baseline 10/11: 15/56    Time 12    Period Weeks    Status Revised    Target Date 05/19/21      PT LONG TERM GOAL #4   Title Patient (> 91 years old) will complete five times sit to stand test in < 15 seconds indicating an increased LE strength and improved balance.    Baseline 10/11: unable to perform from standard height chair, 24 inches 24.52    Time 12    Period Weeks    Status On-going    Target Date 05/19/21      PT LONG TERM GOAL #5   Title Patient will increase BLE gross strength to 4+/5 as to improve functional strength for independent gait, increased standing tolerance and increased ADL ability.    Baseline 10/11: see note    Time 12    Period Weeks    Status On-going    Target Date 05/19/21                   Plan - 05/05/21 0902     Clinical Impression Statement Patient motivated and participated fair within session. He was able to exhibit better tolerance on leg press being able to get legs on/off machine easier. He is still limited in equipment that he can use due to size and height. Patient does report increased RUE shoulder pain this session. He believes this is coming from pushing on walker. He requested to not do hamstring/leg extension machine this session. He does require min A for transfers from low surfaces with increased time/effort required. Patient would benefit from additional skilled PT Intervention to improve strength, balance and  mobility; Patient plans to follow up with orthopedic MD regarding shoulder pain later this week.    Personal Factors and Comorbidities Age;Comorbidity 3+;Finances;Fitness;Past/Current Experience;Social Background;Time since onset of injury/illness/exacerbation;Transportation    Comorbidities leep apnea, HTN, aortic valve disease, GERD, genituourinary malignancy; renal, hydrocephalus s/p shunt placed 03/2020, DVT, Type II DM, obesity, OA, covid, depression, diabetic polyneuropathy, AD, pressure injury of right foot, and falls    Examination-Activity Limitations Bathing;Bed Mobility;Bend;Caring for Others;Carry;Dressing;Hygiene/Grooming;Stairs;Squat;Sit;Reach Overhead;Locomotion Level;Lift;Stand;Toileting;Transfers    Examination-Participation Restrictions Cleaning;Community Activity;Driving;Interpersonal Relationship;Laundry;Shop;Personal Finances;Occupation;Meal Prep;Volunteer;Yard Work    Conservation officer, historic buildings Evolving/Moderate complexity    Rehab Potential Fair    PT Frequency 2x / week    PT Duration 12 weeks    PT Treatment/Interventions ADLs/Self Care Home Management;Aquatic Therapy;Biofeedback;Electrical Stimulation;Traction;Functional mobility training;Stair training;Gait training;DME Instruction;Ultrasound;Moist Heat;Therapeutic activities;Therapeutic exercise;Neuromuscular re-education;Balance training;Manual techniques;Orthotic Fit/Training;Patient/family education;Compression bandaging;Passive range of motion;Dry needling;Vestibular;Taping;Vasopneumatic Device;Energy conservation;Manual lymph drainage;Canalith Repostioning;Visual/perceptual remediation/compensation    PT Next Visit Plan standing balance,Progressive LE strengthening- Focus on eccentric control    PT Home Exercise Plan No changes    Consulted and Agree with Plan of Care Patient             Patient will benefit from skilled  therapeutic intervention in order to improve the following deficits and impairments:   Abnormal gait, Decreased activity tolerance, Decreased balance, Decreased knowledge of precautions, Decreased endurance, Decreased coordination, Decreased mobility, Decreased range of motion, Difficulty walking, Decreased strength, Hypomobility, Impaired flexibility, Impaired perceived functional ability, Impaired sensation, Postural dysfunction, Improper body mechanics, Cardiopulmonary status limiting activity, Impaired UE functional use, Obesity, Pain  Visit Diagnosis: Abnormality of gait and mobility  Difficulty in walking, not elsewhere classified  Unsteadiness on feet  Muscle weakness (generalized)     Problem List Patient Active Problem List   Diagnosis Date Noted   Acute diastolic CHF (congestive heart failure) (HCC) 08/17/2019   Edema    Acute metabolic encephalopathy 08/12/2019   Acute pulmonary edema (HCC) 08/12/2019   Acute cor pulmonale (HCC) 08/11/2019   Oxygen desaturation 08/08/2019   S/P right rotator cuff repair 08/07/2019   Hypoxia 10/22/2018   Infection of right prepatellar bursa 10/18/2018   Sepsis (HCC) 10/18/2018   Chest congestion 07/20/2017   Viral illness 07/20/2017   Lower respiratory infection 07/12/2017   Laryngitis, acute 07/12/2017   Cough 07/12/2017   Pleurisy 07/12/2017   Depression 06/03/2015   HTN (hypertension) 01/02/2015   Hyperlipidemia 01/02/2015   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

## 2021-05-07 ENCOUNTER — Ambulatory Visit: Payer: Medicare Other | Admitting: Physical Therapy

## 2021-05-13 ENCOUNTER — Ambulatory Visit: Payer: Medicare Other | Admitting: Physical Therapy

## 2021-05-13 ENCOUNTER — Other Ambulatory Visit: Payer: Self-pay

## 2021-05-13 DIAGNOSIS — M6281 Muscle weakness (generalized): Secondary | ICD-10-CM

## 2021-05-13 DIAGNOSIS — R262 Difficulty in walking, not elsewhere classified: Secondary | ICD-10-CM

## 2021-05-13 DIAGNOSIS — R269 Unspecified abnormalities of gait and mobility: Secondary | ICD-10-CM

## 2021-05-13 DIAGNOSIS — R2681 Unsteadiness on feet: Secondary | ICD-10-CM

## 2021-05-13 NOTE — Therapy (Signed)
[facility] MAIN [facility] SERVICES [address] [place], [place], [address] Phone: [phone]   Fax:  [phone]  Physical Therapy Treatment  Patient Details  Name: Nathan Dean MRN: [id number] Date of Birth: November 26, 1955 Referring Provider (PT): Vernard Gambles [place]   Encounter Date: 05/13/2021   PT End of Session - 05/13/21 0855     Visit Number 15    Number of Visits 24    Date for PT Re-Evaluation 05/19/21    Authorization Type 1/10 eval 02/24/21    Progress Note Due on Visit 15    PT Start Time 0848    PT Stop Time 0930    PT Time Calculation (min) 42 min    Equipment Utilized During Treatment Gait belt    Activity Tolerance Patient tolerated treatment well;Patient limited by fatigue    Behavior During Therapy WFL for tasks assessed/performed             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    Diabetes (HCC)    GERD (gastroesophageal reflux disease)    Hyperlipidemia    Hypertension    Obesity    Sleep apnea     Past Surgical History:  Procedure Laterality Date   BACK SURGERY     in the 80's -herniated disc   BACK SURGERY     L4 bone spur 1990   CARPAL TUNNEL RELEASE Right 12/28/2016   Procedure: RIGHT ULNAR AND MEDIAN NEUROPLASTY AT WRIST;  Surgeon: Mack Hook, MD;  Location: [facility];  Service: Orthopedics;  Laterality: Right;   CARPAL TUNNEL RELEASE Left    25 years ago   CATARACT EXTRACTION W/ INTRAOCULAR LENS IMPLANT Bilateral    REPLACEMENT TOTAL KNEE BILATERAL  2014   left 2012 rt 2014   SHOULDER OPEN ROTATOR CUFF REPAIR Right 08/07/2019   Procedure: ROTATOR CUFF REPAIR SHOULDER OPEN;  Surgeon: Juanell Fairly, MD;  Location: [facility];  Service: Orthopedics;  Laterality: Right;   TONSILLECTOMY      There were no vitals filed for this visit.   Subjective Assessment - 05/13/21 0850     Subjective Pt reports  continued pain in the shoulder. Reports he cancelled his future due to dr. scheduling issues.    Pertinent History Patient has seen this therapist in 2019. Was being treated at [facility] clinic but is transferring to this clinic for balance training. PMH includes sleep apnea, HTN, aortic valve disease, GERD, genituourinary malignancy; renal, hydrocephalus s/p shunt placed 03/2020, DVT, Type II DM, obesity, OA, covid, depression, diabetic polyneuropathy, AD, pressure injury of right foot, and falls. Patient had R rotator cuff surgery a year and a half and has not had a good recovery process.  Patient has a history of falling prior to COVID, falls decreased after shunt but still are present. Will see a podiatrist soon for his pressure injury on R foot. Referral is for diabetic polyneuropathy, foot drop R foot, and falling. Patient reports >30 falls in past 6 months.    Limitations Sitting;Standing;Walking;House hold activities;Other (comment)    How long can you sit comfortably? a few hours    How long can you stand comfortably? immediately unstable    How long can you walk comfortably? immediately unstable    Patient Stated Goals improve leg strength. more independent    Pain Score 7     Pain Location Shoulder    Pain Orientation Right  Pain Descriptors / Indicators Aching;Sore    Pain Type Chronic pain    Pain Onset More than a month ago    Pain Frequency Intermittent    Aggravating Factors  weight bearing, shoulder movement.    Pain Onset In the past 7 days                Exercise/Activity Sets/Reps/Time/ Resistance Assistance Charge type Comments  STS  2 x 5  Increased mat table height to allow no UE utilization TherACT Fatigue noted on second set and had to take rest breaks between repetitions. NO UE support upon standing to challenge balance         HS curl 15xL8 12xL9 10xL10 10xL111 minA with positioning   therex   Setup assistance to accommodate his size  In wellzone    Knee extension machine 10 x L 5  2x5x L 6  10 x 5 sec hold at end range L2 minA with positioning   therex   Setup assistance to accommodate his size  In wellzone   Interval training on nustep  -LE only L1 for rest  L5 for interval (30 sec) 7 min total minA with positioning of LE    therex   In wellzone, encourage to take full rest breaks following high intensity interval -monitor R LE to prevent excessive ER as if too much ER pr knee can skid against nustep handle -supervision level assist getting on/ offf                                       Treatment Provided this session   Pt educated throughout session about proper posture and technique with exercises. Improved exercise technique, movement at target joints, use of target muscles after min to mod verbal, visual, tactile cues.  Note: Portions of this document were prepared using Dragon voice recognition software and although reviewed may contain unintentional dictation errors in syntax, grammar, or spelling.   Pt required occasional rest breaks due fatigue, PT was quick to ask when pt appeared to be fatiguing in order to prevent excessive fatigue.                            PT Short Term Goals - 04/17/21 0934       PT SHORT TERM GOAL #1   Title Patient will be independent in home exercise program to improve strength/mobility for better functional independence with ADLs.    Baseline 10/11 HEP given    Time 4    Period Weeks    Status New    Target Date 03/24/21      PT SHORT TERM GOAL #2   Title Patient will report no falls in past two weeks indicating improved stability and decreased fall risk.    Baseline 10/11; multiple falls 12/2: fall within this week (reports decrease in frequency)    Time 4    Period Weeks    Status New    Target Date 03/24/21               PT Long Term Goals - 04/17/21 0942       PT LONG TERM GOAL #1   Title Patient will increase FOTO score to equal to  or greater than  58%   to demonstrate statistically significant improvement in mobility and quality of life.    Baseline  10/11: 41%, 04/17/21: 53    Time 12    Period Weeks    Status On-going    Target Date 06/12/21      PT LONG TERM GOAL #2   Title Patient will increase ABC scale score >60% to demonstrate better functional mobility and better confidence with ADLs.     Baseline 10/11: 32.5% 12/2:50.1%    Time 12    Period Weeks    Status On-going    Target Date 05/19/21      PT LONG TERM GOAL #3   Title Patient will increase Berg Balance score by > 6 points (21/56)  to demonstrate decreased fall risk during functional activities.    Baseline 10/11: 15/56    Time 12    Period Weeks    Status Revised    Target Date 05/19/21      PT LONG TERM GOAL #4   Title Patient (> 87 years old) will complete five times sit to stand test in < 15 seconds indicating an increased LE strength and improved balance.    Baseline 10/11: unable to perform from standard height chair, 24 inches 24.52    Time 12    Period Weeks    Status On-going    Target Date 05/19/21      PT LONG TERM GOAL #5   Title Patient will increase BLE gross strength to 4+/5 as to improve functional strength for independent gait, increased standing tolerance and increased ADL ability.    Baseline 10/11: see note    Time 12    Period Weeks    Status On-going    Target Date 05/19/21                   Plan - 05/13/21 1034     Clinical Impression Statement Patient motivated and participated well within session. Pt continued to show fatigue with nustep and difficulty with STS with increased table height. Pt did demonstrate improved tolerance with hamstring curls this session ad was able to tolerate increased resistance. Pt will continue to benefit from skilled PT intervention in order to improve LE strength, balance, and mobility.    Personal Factors and Comorbidities Age;Comorbidity 3+;Finances;Fitness;Past/Current  Experience;Social Background;Time since onset of injury/illness/exacerbation;Transportation    Comorbidities leep apnea, HTN, aortic valve disease, GERD, genituourinary malignancy; renal, hydrocephalus s/p shunt placed 03/2020, DVT, Type II DM, obesity, OA, covid, depression, diabetic polyneuropathy, AD, pressure injury of right foot, and falls    Examination-Activity Limitations Bathing;Bed Mobility;Bend;Caring for Others;Carry;Dressing;Hygiene/Grooming;Stairs;Squat;Sit;Reach Overhead;Locomotion Level;Lift;Stand;Toileting;Transfers    Examination-Participation Restrictions Cleaning;Community Activity;Driving;Interpersonal Relationship;Laundry;Shop;Personal Finances;Occupation;Meal Prep;Volunteer;Yard Work    Conservation officer, historic buildings Evolving/Moderate complexity    Rehab Potential Fair    PT Frequency 2x / week    PT Duration 12 weeks    PT Treatment/Interventions ADLs/Self Care Home Management;Aquatic Therapy;Biofeedback;Electrical Stimulation;Traction;Functional mobility training;Stair training;Gait training;DME Instruction;Ultrasound;Moist Heat;Therapeutic activities;Therapeutic exercise;Neuromuscular re-education;Balance training;Manual techniques;Orthotic Fit/Training;Patient/family education;Compression bandaging;Passive range of motion;Dry needling;Vestibular;Taping;Vasopneumatic Device;Energy conservation;Manual lymph drainage;Canalith Repostioning;Visual/perceptual remediation/compensation    PT Next Visit Plan standing balance,Progressive LE strengthening- Focus on eccentric control    PT Home Exercise Plan No changes    Consulted and Agree with Plan of Care Patient             Patient will benefit from skilled therapeutic intervention in order to improve the following deficits and impairments:  Abnormal gait, Decreased activity tolerance, Decreased balance, Decreased knowledge of precautions, Decreased endurance, Decreased coordination, Decreased mobility, Decreased range of  motion, Difficulty walking, Decreased strength, Hypomobility, Impaired flexibility,  Impaired perceived functional ability, Impaired sensation, Postural dysfunction, Improper body mechanics, Cardiopulmonary status limiting activity, Impaired UE functional use, Obesity, Pain  Visit Diagnosis: Abnormality of gait and mobility  Difficulty in walking, not elsewhere classified  Muscle weakness (generalized)  Unsteadiness on feet     Problem List Patient Active Problem List   Diagnosis Date Noted   Acute diastolic CHF (congestive heart failure) (HCC) 08/17/2019   Edema    Acute metabolic encephalopathy 08/12/2019   Acute pulmonary edema (HCC) 08/12/2019   Acute cor pulmonale (HCC) 08/11/2019   Oxygen desaturation 08/08/2019   S/P right rotator cuff repair 08/07/2019   Hypoxia 10/22/2018   Infection of right prepatellar bursa 10/18/2018   Sepsis (HCC) 10/18/2018   Chest congestion 07/20/2017   Viral illness 07/20/2017   Lower respiratory infection 07/12/2017   Laryngitis, acute 07/12/2017   Cough 07/12/2017   Pleurisy 07/12/2017   Depression 06/03/2015   HTN (hypertension) 01/02/2015   Hyperlipidemia 01/02/2015   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 [place], [place], [address] Phone: [phone]   Fax:  [phone]  Name: Nathan Dean MRN: [id number] Date of Birth: [birth year]

## 2021-05-20 ENCOUNTER — Ambulatory Visit: Payer: Medicare Other | Admitting: Physical Therapy

## 2021-05-21 ENCOUNTER — Other Ambulatory Visit: Payer: Medicare Other

## 2021-05-22 ENCOUNTER — Ambulatory Visit: Payer: Medicare Other | Admitting: Physical Therapy

## 2021-05-27 ENCOUNTER — Ambulatory Visit: Payer: Medicare Other

## 2021-05-29 ENCOUNTER — Ambulatory Visit (INDEPENDENT_AMBULATORY_CARE_PROVIDER_SITE_OTHER): Payer: Medicare Other | Admitting: Podiatry

## 2021-05-29 ENCOUNTER — Encounter: Payer: Self-pay | Admitting: Podiatry

## 2021-05-29 ENCOUNTER — Other Ambulatory Visit: Payer: Self-pay

## 2021-05-29 ENCOUNTER — Ambulatory Visit: Payer: Medicare Other

## 2021-05-29 ENCOUNTER — Ambulatory Visit: Payer: Medicare Other | Admitting: Physical Therapy

## 2021-05-29 DIAGNOSIS — E08621 Diabetes mellitus due to underlying condition with foot ulcer: Secondary | ICD-10-CM

## 2021-05-29 DIAGNOSIS — L97512 Non-pressure chronic ulcer of other part of right foot with fat layer exposed: Secondary | ICD-10-CM | POA: Diagnosis not present

## 2021-05-29 DIAGNOSIS — E1142 Type 2 diabetes mellitus with diabetic polyneuropathy: Secondary | ICD-10-CM

## 2021-05-29 NOTE — Progress Notes (Signed)
° °  Subjective:  66 y.o. male with PMHx of diabetes mellitus presenting today for follow-up evaluation regarding ulcers to the fifth toe area of the right foot.  Patient states that there is some improvement.  He continues to have some sensitivity to the toe however.  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  Dermatology:  Wound #1 noted to the lateral aspect of the fifth MTP joint right foot measuring approximately 0.5 x 0.5 x 0.1 cm (LxWxD).   Wound #2 noted to the fifth toe just proximal to the toenail plate.  The toenail is actually loosely adhered.  This area measures about 0.5 x 0.5 x 0.1 cm.  To the noted ulceration(s), there is no eschar. There is a moderate amount of slough, fibrin, and necrotic tissue noted. Granulation tissue and wound base is red. There is a minimal amount of serosanguineous drainage noted. There is no exposed bone muscle-tendon ligament or joint. There is no malodor. Periwound integrity is intact. Skin is warm, dry and supple bilateral lower extremities.  Vascular: Palpable pedal pulses bilaterally. No edema or erythema noted. Capillary refill within normal limits.  Neurological: Epicritic and protective threshold diminished bilaterally.   Musculoskeletal Exam: No pedal deformities noted  Assessment: 1.  Ulcer right fifth MTP joint and fifth toe just proximal to the nail plate right secondary to diabetes mellitus 2. diabetes mellitus w/ peripheral neuropathy   Plan of Care:  1. Patient was evaluated. 2. medically necessary excisional debridement including subcutaneous tissue was performed using a tissue nipper and a  chisel blade. Excisional debridement of all the necrotic nonviable tissue down to healthy bleeding viable tissue was performed with post-debridement measurements same as pre-. 3. the wound was cleansed and dry sterile dressing applied. 4.  Continue gentamicin cream daily 5.  Today the patient was molded for custom molded diabetic shoes and insoles with our Pedorthist 6.  Return to clinic in 3 weeks   Felecia Shelling, DPM [facility]  Dr. Felecia Shelling, DPM    [address] [place], [place] [address]                Office [phone]  Fax [phone]

## 2021-05-29 NOTE — Progress Notes (Signed)
SITUATION Reason for Consult: Evaluation for Prefabricated Diabetic Shoes and Bilateral Custom Diabetic Inserts. Patient / Caregiver Report: Patient would like well fitting shoes  OBJECTIVE DATA: Patient History / Diagnosis:    ICD-10-CM   1. 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 Diabetic ulcer of toe of right foot associated with diabetes mellitus due to underlying condition, with fat layer exposed ([place])  U36.725    L97.512       Current or Previous Devices:   Right foot drop AFO  In-Person Foot Examination: Ulcers & Callousing:   Right met  Toe / Foot Deformities:   - Pes Planus  - Hammertoes   Shoe Size: 15W  ORTHOTIC RECOMMENDATION Recommended Devices: - 1x pair prefabricated PDAC approved diabetic shoes: [id number] 15W - 3x pair H0016 custom-to-patient vacuum formed diabetic insoles.   GOALS OF SHOES AND INSOLES - Reduce shear and pressure - Reduce / Prevent callus formation - Reduce / Prevent ulceration - Protect the fragile healing compromised diabetic foot.  Patient would benefit from diabetic shoes and inserts as patient has diabetes mellitus and the patient has one or more of the following conditions: - History of partial or complete amputation of the foot - History of previous foot ulceration. - History of pre-ulcerative callus - Peripheral neuropathy with evidence of callus formation - Foot deformity - Poor circulation  ACTIONS PERFORMED Patient was casted for insoles via crush box and measured for shoes via brannock device. Procedure was explained and patient tolerated procedure well. All questions were answered and concerns addressed.  PLAN Patient is to ensure treating physician receives and completes diabetic paperwork. Casts and shoe order are to be held until paperwork is received. Once received patient is to be scheduled for fitting in four weeks.

## 2021-06-03 ENCOUNTER — Other Ambulatory Visit: Payer: Self-pay

## 2021-06-03 ENCOUNTER — Ambulatory Visit: Payer: Medicare Other | Attending: Student | Admitting: Physical Therapy

## 2021-06-03 DIAGNOSIS — R262 Difficulty in walking, not elsewhere classified: Secondary | ICD-10-CM | POA: Diagnosis present

## 2021-06-03 DIAGNOSIS — R2681 Unsteadiness on feet: Secondary | ICD-10-CM

## 2021-06-03 DIAGNOSIS — R269 Unspecified abnormalities of gait and mobility: Secondary | ICD-10-CM

## 2021-06-03 DIAGNOSIS — R2689 Other abnormalities of gait and mobility: Secondary | ICD-10-CM | POA: Diagnosis present

## 2021-06-03 DIAGNOSIS — M6281 Muscle weakness (generalized): Secondary | ICD-10-CM | POA: Diagnosis present

## 2021-06-03 NOTE — Addendum Note (Signed)
Addended by: Thresa Ross B on: 06/03/2021 02:08 PM   Modules accepted: Orders

## 2021-06-03 NOTE — Therapy (Signed)
[place] MAIN [facility] SERVICES [address] [place], [place], [address] Phone: [phone]   Fax:  [phone]  Physical Therapy Treatment/ RE-CERTIFICATION NOTE  Patient Details  Name: Nathan Dean MRN: [id number] Date of Birth: [birth year] Referring Provider (PT): Margart Sickles [place]   Encounter Date: 06/03/2021   PT End of Session - 06/03/21 1254     Visit Number 16    Number of Visits 40    Date for PT Re-Evaluation 08/26/21    Authorization Type 1/10 eval 02/24/21 Auth 06/03/21-08/26/21    Progress Note Due on Visit 15    PT Start Time 1145    PT Stop Time 1233    PT Time Calculation (min) 48 min    Equipment Utilized During Treatment Gait belt    Activity Tolerance Patient tolerated treatment well;Patient limited by fatigue    Behavior During Therapy WFL for tasks assessed/performed             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    Diabetes ([place])    GERD (gastroesophageal reflux disease)    Hyperlipidemia    Hypertension    Obesity    Sleep apnea     Past Surgical History:  Procedure Laterality Date   BACK SURGERY     in the 80's -herniated disc   BACK SURGERY     L4 bone spur 1990   CARPAL TUNNEL RELEASE Right 12/28/2016   Procedure: RIGHT ULNAR AND MEDIAN NEUROPLASTY AT WRIST;  Surgeon: Milly Jakob, MD;  Location: [facility];  Service: Orthopedics;  Laterality: Right;   CARPAL TUNNEL RELEASE Left    25 years ago   CATARACT EXTRACTION W/ INTRAOCULAR LENS IMPLANT Bilateral    REPLACEMENT TOTAL KNEE BILATERAL  2014   left 2012 rt 2014   SHOULDER OPEN ROTATOR CUFF REPAIR Right 08/07/2019   Procedure: ROTATOR CUFF REPAIR SHOULDER OPEN;  Surgeon: Thornton Park, MD;  Location: [facility];  Service: Orthopedics;  Laterality: Right;   TONSILLECTOMY      There were no vitals filed for this visit.   Subjective Assessment -  06/03/21 1142     Subjective Pt reports he had a fall thia past Saturday. Reports he was reaching down to put crock pot away on a lower shelf. Also reports continued shoudler pain and discussion was held regardinghis plan for shoulder surgery. Pt instructed in fall prevention strategy.    Pertinent History Patient has seen this therapist in 2019. Was being treated at [facility] clinic but is transferring to this clinic for balance training. PMH includes sleep apnea, HTN, aortic valve disease, GERD, genituourinary malignancy; renal, hydrocephalus s/p shunt placed 03/2020, DVT, Type II DM, obesity, OA, covid, depression, diabetic polyneuropathy, AD, pressure injury of right foot, and falls. Patient had R rotator cuff surgery a year and a half and has not had a good recovery process.  Patient has a history of falling prior to COVID, falls decreased after shunt but still are present. Will see a podiatrist soon for his pressure injury on R foot. Referral is for diabetic polyneuropathy, foot drop R foot, and falling. Patient reports >30 falls in past 6 months.    Limitations Sitting;Standing;Walking;House hold activities;Other (comment)    How long can you sit comfortably? a few hours    How long can you stand comfortably? immediately unstable    How long can you walk comfortably? immediately unstable  Patient Stated Goals improve leg strength. more independent    Pain Onset More than a month ago    Pain Onset In the past 7 days             Recert    [facility] PT Assessment - 06/03/21 0001       Berg Balance Test   Sit to Stand Able to stand using hands after several tries    Standing Unsupported Able to stand 2 minutes with supervision    Sitting with Back Unsupported but Feet Supported on Floor or Stool Able to sit safely and securely 2 minutes    Stand to Sit Controls descent by using hands    Transfers Able to transfer with verbal cueing and /or supervision    Standing Unsupported with Eyes  Closed Able to stand 3 seconds    Standing Unsupported with Feet Together Able to place feet together independently and stand for 1 minute with supervision    From Standing, Reach Forward with Outstretched Arm Can reach forward >12 cm safely (5")    From Standing Position, Pick up Object from Floor Unable to try/needs assist to keep balance    From Standing Position, Turn to Look Behind Over each Shoulder Turn sideways only but maintains balance    Turn 360 Degrees Needs assistance while turning    Standing Unsupported, Alternately Place Feet on Step/Stool Needs assistance to keep from falling or unable to try    Standing Unsupported, One Foot in Front Able to take small step independently and hold 30 seconds    Standing on One Leg Unable to try or needs assist to prevent fall    Total Score 26               Exercise/Activity Sets/Reps/Time/ Resistance Assistance Charge type Comments  Bridges  1 x 10   Therex Min back pain with this activity   Clamshells supine  1 x 10 x 5 sec hold BTB Setup  Therex  Cues for hold times and slow eccentric control   Resisted marching supine 1 x 10 ea with BTB Setup  therex Cues for slow eccentric control   Seated resisted LAQ  1 x 10 ea with RTB Setup  therex Cues for hold time and full ROM Unable to complete with BTB (could not achieve full ROM)                                            Treatment Provided this session   Pt educated throughout session about proper posture and technique with exercises. Improved exercise technique, movement at target joints, use of target muscles after min to mod verbal, visual, tactile cues. Note: Portions of this document were prepared using Dragon voice recognition software and although reviewed may contain unintentional dictation errors in syntax, grammar, or spelling.                       PT Education - 06/03/21 1253     Education Details Fall prevention strategies    Person(s) Educated  Patient    Methods Explanation    Comprehension Verbalized understanding              PT Short Term Goals - 06/03/21 1157       PT SHORT TERM GOAL #1   Title Patient will be independent in home  exercise program to improve strength/mobility for better functional independence with ADLs.    Baseline 10/11 HEP given, 06/03/21 updated HEP provided    Time 4    Period Weeks    Status Partially Met    Target Date 07/29/21      PT SHORT TERM GOAL #2   Title Patient will report no falls in past two weeks indicating improved stability and decreased fall risk.    Baseline 10/11; multiple falls 12/2: fall within this week (reports decrease in frequency) 1/18: fall within past week    Time 4    Period Weeks    Status New    Target Date 03/24/21               PT Long Term Goals - 06/03/21 1355       PT LONG TERM GOAL #1   Title Patient will increase FOTO score to equal to or greater than  58%   to demonstrate statistically significant improvement in mobility and quality of life.    Baseline 10/11: 41%, 04/17/21: 53    Time 12    Period Weeks    Status On-going    Target Date 08/26/21      PT LONG TERM GOAL #2   Title Patient will increase ABC scale score >60% to demonstrate better functional mobility and better confidence with ADLs.     Baseline 10/11: 32.5% 12/2:50.1% 06/03/21: 53.4%    Time 12    Period Weeks    Status On-going    Target Date 08/26/21      PT LONG TERM GOAL #3   Title Patient will increase Berg Balance score by > 6 points (21/56)  to demonstrate decreased fall risk during functional activities.    Baseline 10/11: 15/56 06/03/21: 26    Time 12    Period Weeks    Status On-going    Target Date 08/26/21      PT LONG TERM GOAL #4   Title Patient (> 76 years old) will complete five times sit to stand test in < 20 seconds from 24 inch surface indicating an increased LE strength and improved balance.    Baseline 10/11: unable to perform from standard height  chair, 24 inches 24.52 06/03/21: 22.5sec from 26 in table height    Time 12    Period Weeks    Status On-going    Target Date 08/26/21      PT LONG TERM GOAL #5   Title Patient will increase BLE gross strength to 4+/5 as to improve functional strength for independent gait, increased standing tolerance and increased ADL ability.    Baseline 10/11: see note    Time 12    Period Weeks    Status On-going    Target Date 08/26/21                   Plan - 06/03/21 1255     Clinical Impression Statement Pt presents to therapy for recerticfication this date. Pt is continuing to have regular falls, many due to attempting to reach down to lower levels outside of his BOS. Pt has made some proegress with BERG balance test but is still at high risk for falls based on results. Pt decreased his time with TUG but was also unable to complete test at previous table height. Decrease in function with this may be a result of his recent respiratory illness which kept him out of therapy for the past several weeks. Pt was  instructed and provided with handout in advanced HEP for strengthening his hips and lower extremities as his current program has become less challenging as he has progressed.  Pt will continue to benefit from skilled PT intervention in order to improve LE strength, balance, and mobility.    Personal Factors and Comorbidities Age;Comorbidity 3+;Finances;Fitness;Past/Current Experience;Social Background;Time since onset of injury/illness/exacerbation;Transportation    Comorbidities leep apnea, HTN, aortic valve disease, GERD, genituourinary malignancy; renal, hydrocephalus s/p shunt placed 03/2020, DVT, Type II DM, obesity, OA, covid, depression, diabetic polyneuropathy, AD, pressure injury of right foot, and falls    Examination-Activity Limitations Bathing;Bed Mobility;Bend;Caring for Others;Carry;Dressing;Hygiene/Grooming;Stairs;Squat;Sit;Reach Overhead;Locomotion  Level;Lift;Stand;Toileting;Transfers    Examination-Participation Restrictions Cleaning;Community Activity;Driving;Interpersonal Relationship;Laundry;Shop;Personal Finances;Occupation;Meal Prep;Volunteer;Yard Work    [employer] Evolving/Moderate complexity    Rehab Potential Fair    PT Frequency 2x / week    PT Duration 12 weeks    PT Treatment/Interventions ADLs/Self Care Home Management;Aquatic Therapy;Biofeedback;Electrical Stimulation;Traction;Functional mobility training;Stair training;Gait training;DME Instruction;Ultrasound;Moist Heat;Therapeutic activities;Therapeutic exercise;Neuromuscular re-education;Balance training;Manual techniques;Orthotic Fit/Training;Patient/family education;Compression bandaging;Passive range of motion;Dry needling;Vestibular;Taping;Vasopneumatic Device;Energy conservation;Manual lymph drainage;Canalith Repostioning;Visual/perceptual remediation/compensation    PT Next Visit Plan standing balance,Progressive LE strengthening- Focus on eccentric control    PT Home Exercise Plan No changes    Consulted and Agree with Plan of Care Patient             Patient will benefit from skilled therapeutic intervention in order to improve the following deficits and impairments:  Abnormal gait, Decreased activity tolerance, Decreased balance, Decreased knowledge of precautions, Decreased endurance, Decreased coordination, Decreased mobility, Decreased range of motion, Difficulty walking, Decreased strength, Hypomobility, Impaired flexibility, Impaired perceived functional ability, Impaired sensation, Postural dysfunction, Improper body mechanics, Cardiopulmonary status limiting activity, Impaired UE functional use, Obesity, Pain  Visit Diagnosis: Abnormality of gait and mobility  Difficulty in walking, not elsewhere classified  Muscle weakness (generalized)  Unsteadiness on feet     Problem List Patient Active Problem List   Diagnosis Date  Noted   Acute diastolic CHF (congestive heart failure) ([place]) 08/17/2019   Edema    Acute metabolic encephalopathy 50/38/8828   Acute pulmonary edema ([place]) 08/12/2019   Acute cor pulmonale ([facility]) 08/11/2019   Oxygen desaturation 08/08/2019   S/P right rotator cuff repair 08/07/2019   Hypoxia 10/22/2018   Infection of right prepatellar bursa 10/18/2018   Sepsis ([place]) 10/18/2018   Chest congestion 07/20/2017   Viral illness 07/20/2017   Lower respiratory infection 07/12/2017   Laryngitis, acute 07/12/2017   Cough 07/12/2017   Pleurisy 07/12/2017   Depression 06/03/2015   HTN (hypertension) 01/02/2015   Hyperlipidemia 01/02/2015   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 [place], [place], [address] Phone: [phone]   Fax:  [phone]  Name: Nathan Dean MRN: [id number] Date of Birth: Mar 15, 1956

## 2021-06-05 ENCOUNTER — Other Ambulatory Visit: Payer: Self-pay

## 2021-06-05 ENCOUNTER — Ambulatory Visit: Payer: Medicare Other | Admitting: Physical Therapy

## 2021-06-05 DIAGNOSIS — M6281 Muscle weakness (generalized): Secondary | ICD-10-CM

## 2021-06-05 DIAGNOSIS — R269 Unspecified abnormalities of gait and mobility: Secondary | ICD-10-CM

## 2021-06-05 DIAGNOSIS — R262 Difficulty in walking, not elsewhere classified: Secondary | ICD-10-CM

## 2021-06-05 DIAGNOSIS — R2681 Unsteadiness on feet: Secondary | ICD-10-CM

## 2021-06-05 NOTE — Therapy (Signed)
[place] MAIN [facility] SERVICES [address] [place], [place], [address] Phone: [phone]   Fax:  [phone]  Physical Therapy Treatment  Patient Details  Name: Nathan Dean MRN: [id number] Date of Birth: [birth year] Referring Provider (PT): Margart Sickles [place]   Encounter Date: 06/05/2021   PT End of Session - 06/05/21 1143     Visit Number 17    Number of Visits 40    Date for PT Re-Evaluation 08/26/21    Authorization Type 1/10 eval 02/24/21 Auth 06/03/21-08/26/21    Progress Note Due on Visit 20    PT Start Time 1046    PT Stop Time 1128    PT Time Calculation (min) 42 min    Equipment Utilized During Treatment Gait belt    Activity Tolerance Patient tolerated treatment well;Patient limited by fatigue    Behavior During Therapy WFL for tasks assessed/performed             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    Diabetes ([place])    GERD (gastroesophageal reflux disease)    Hyperlipidemia    Hypertension    Obesity    Sleep apnea     Past Surgical History:  Procedure Laterality Date   BACK SURGERY     in the 80's -herniated disc   BACK SURGERY     L4 bone spur 1990   CARPAL TUNNEL RELEASE Right 12/28/2016   Procedure: RIGHT ULNAR AND MEDIAN NEUROPLASTY AT WRIST;  Surgeon: Milly Jakob, MD;  Location: [place];  Service: Orthopedics;  Laterality: Right;   CARPAL TUNNEL RELEASE Left    25 years ago   CATARACT EXTRACTION W/ INTRAOCULAR LENS IMPLANT Bilateral    REPLACEMENT TOTAL KNEE BILATERAL  2014   left 2012 rt 2014   SHOULDER OPEN ROTATOR CUFF REPAIR Right 08/07/2019   Procedure: ROTATOR CUFF REPAIR SHOULDER OPEN;  Surgeon: Thornton Park, MD;  Location: [facility];  Service: Orthopedics;  Laterality: Right;   TONSILLECTOMY      There were no vitals filed for this visit.   Subjective Assessment - 06/05/21 1118      Subjective Pt reports no falls since last visit. Reports ambuating with quad cane has been efective for at home. Pt reports no other significant changes since last visit. Reports new HEP going well but has pain in his upper/mid back with bridges. Instructed to hold bridges until this pain subsides or is not present.    Pertinent History Patient has seen this therapist in 2019. Was being treated at [facility] clinic but is transferring to this clinic for balance training. PMH includes sleep apnea, HTN, aortic valve disease, GERD, genituourinary malignancy; renal, hydrocephalus s/p shunt placed 03/2020, DVT, Type II DM, obesity, OA, covid, depression, diabetic polyneuropathy, AD, pressure injury of right foot, and falls. Patient had R rotator cuff surgery a year and a half and has not had a good recovery process.  Patient has a history of falling prior to COVID, falls decreased after shunt but still are present. Will see a podiatrist soon for his pressure injury on R foot. Referral is for diabetic polyneuropathy, foot drop R foot, and falling. Patient reports >30 falls in past 6 months.    Limitations Sitting;Standing;Walking;House hold activities;Other (comment)    How long can you sit comfortably? a few hours    How long can you stand comfortably? immediately unstable    How long can  you walk comfortably? immediately unstable    Patient Stated Goals improve leg strength. more independent    Pain Onset More than a month ago    Pain Onset In the past 7 days                Exercise/Activity Sets/Reps/Time/ Resistance Assistance Charge type Comments              HS curl 15xL8 12xL9 10xL10 10xL111 minA with positioning   therex   Setup assistance to accommodate his size  In wellzone   Knee extension machine 10 x L 5  2x5x L 6  10 x 5 sec hold at end range L2 minA with positioning   therex   Setup assistance to accommodate his size  In wellzone   Interval training on nustep  -LE only L1  for rest  L5 for interval (30 sec) 7 min total minA with positioning of LE    therex   In wellzone, encourage to take full rest breaks following high intensity interval -monitor R LE to prevent excessive ER as if too much ER pr knee can skid against nustep handle -supervision level assist getting on/ offf   Ball roll outs- 2 way  10 x 5 sec holds   Therex  For back pain felt with activities completed in HEP as well as with STS activities   Seated QL stretch  30 sec   Therex  Hip adduction on the right with left side trunk lean in seated position    Manual assessment and STM 4 min  Therex Significant discomfort in this area                     Treatment Provided this session   Pt educated throughout session about proper posture and technique with exercises. Improved exercise technique, movement at target joints, use of target muscles after min to mod verbal, visual, tactile cues.  Note: Portions of this document were prepared using Dragon voice recognition software and although reviewed may contain unintentional dictation errors in syntax, grammar, or spelling.   Pt required occasional rest breaks due fatigue, PT was quick to ask when pt appeared to be fatiguing in order to prevent excessive fatigue.                           PT Short Term Goals - 06/03/21 1157       PT SHORT TERM GOAL #1   Title Patient will be independent in home exercise program to improve strength/mobility for better functional independence with ADLs.    Baseline 10/11 HEP given, 06/03/21 updated HEP provided    Time 4    Period Weeks    Status Partially Met    Target Date 07/29/21      PT SHORT TERM GOAL #2   Title Patient will report no falls in past two weeks indicating improved stability and decreased fall risk.    Baseline 10/11; multiple falls 12/2: fall within this week (reports decrease in frequency) 1/18: fall within past week    Time 4    Period Weeks    Status New     Target Date 03/24/21               PT Long Term Goals - 06/03/21 1355       PT LONG TERM GOAL #1   Title Patient will increase FOTO score to equal to or greater than  58%   to demonstrate statistically significant improvement in mobility and quality of life.    Baseline 10/11: 41%, 04/17/21: 53    Time 12    Period Weeks    Status On-going    Target Date 08/26/21      PT LONG TERM GOAL #2   Title Patient will increase ABC scale score >60% to demonstrate better functional mobility and better confidence with ADLs.     Baseline 10/11: 32.5% 12/2:50.1% 06/03/21: 53.4%    Time 12    Period Weeks    Status On-going    Target Date 08/26/21      PT LONG TERM GOAL #3   Title Patient will increase Berg Balance score by > 6 points (21/56)  to demonstrate decreased fall risk during functional activities.    Baseline 10/11: 15/56 06/03/21: 26    Time 12    Period Weeks    Status On-going    Target Date 08/26/21      PT LONG TERM GOAL #4   Title Patient (> 76 years old) will complete five times sit to stand test in < 20 seconds from 24 inch surface indicating an increased LE strength and improved balance.    Baseline 10/11: unable to perform from standard height chair, 24 inches 24.52 06/03/21: 22.5sec from 26 in table height    Time 12    Period Weeks    Status On-going    Target Date 08/26/21      PT LONG TERM GOAL #5   Title Patient will increase BLE gross strength to 4+/5 as to improve functional strength for independent gait, increased standing tolerance and increased ADL ability.    Baseline 10/11: see note    Time 12    Period Weeks    Status On-going    Target Date 08/26/21                   Plan - 06/05/21 1143     Clinical Impression Statement Pt presents to therapy with excellent motivation for completion of PT program. Pt was able to begin with his advanced strengthening program at home and overall tolerated well but had some discomfort in his back (likely  trunk extensors) with bridge exercise. This was modified to glute set exercise to prevent further agitation of extensor tissue. Pt responded well to general lower back sttretching for relief of this discmofort. Pt will continue to benefit from skilled PT intervention in order to imporve his LE strength, reduce his fall risk and imrove his overll QOL.    Personal Factors and Comorbidities Age;Comorbidity 3+;Finances;Fitness;Past/Current Experience;Social Background;Time since onset of injury/illness/exacerbation;Transportation    Comorbidities leep apnea, HTN, aortic valve disease, GERD, genituourinary malignancy; renal, hydrocephalus s/p shunt placed 03/2020, DVT, Type II DM, obesity, OA, covid, depression, diabetic polyneuropathy, AD, pressure injury of right foot, and falls    Examination-Activity Limitations Bathing;Bed Mobility;Bend;Caring for Others;Carry;Dressing;Hygiene/Grooming;Stairs;Squat;Sit;Reach Overhead;Locomotion Level;Lift;Stand;Toileting;Transfers    Examination-Participation Restrictions Cleaning;Community Activity;Driving;Interpersonal Relationship;Laundry;Shop;Personal Finances;Occupation;Meal Prep;Volunteer;Yard Work    [employer]chant [employer] officer Evolving/Moderate complexity    Rehab Potential Fair    PT Frequency 2x / week    PT Duration 12 weeks    PT Treatment/Interventions ADLs/Self Care Home Management;Aquatic Therapy;Biofeedback;Electrical Stimulation;Traction;Functional mobility training;Stair training;Gait training;DME Instruction;Ultrasound;Moist Heat;Therapeutic activities;Therapeutic exercise;Neuromuscular re-education;Balance training;Manual techniques;Orthotic Fit/Training;Patient/family education;Compression bandaging;Passive range of motion;Dry needling;Vestibular;Taping;Vasopneumatic Device;Energy conservation;Manual lymph drainage;Canalith Repostioning;Visual/perceptual remediation/compensation    PT Next Visit Plan standing balance,Progressive LE  strengthening- Focus on eccentric control    PT Home Exercise Plan Handout:  clamshells, LAQ, bridges or glute sets, sit to stands    Consulted and Agree with Plan of Care Patient             Patient will benefit from skilled therapeutic intervention in order to improve the following deficits and impairments:  Abnormal gait, Decreased activity tolerance, Decreased balance, Decreased knowledge of precautions, Decreased endurance, Decreased coordination, Decreased mobility, Decreased range of motion, Difficulty walking, Decreased strength, Hypomobility, Impaired flexibility, Impaired perceived functional ability, Impaired sensation, Postural dysfunction, Improper body mechanics, Cardiopulmonary status limiting activity, Impaired UE functional use, Obesity, Pain  Visit Diagnosis: Abnormality of gait and mobility  Difficulty in walking, not elsewhere classified  Muscle weakness (generalized)  Unsteadiness on feet     Problem List Patient Active Problem List   Diagnosis Date Noted   Acute diastolic CHF (congestive heart failure) ([place]) 08/17/2019   Edema    Acute metabolic encephalopathy 95/39/6728   Acute pulmonary edema ([place]) 08/12/2019   Acute cor pulmonale (HCC) 08/11/2019   Oxygen desaturation 08/08/2019   S/P right rotator cuff repair 08/07/2019   Hypoxia 10/22/2018   Infection of right prepatellar bursa 10/18/2018   Sepsis ([place]) 10/18/2018   Chest congestion 07/20/2017   Viral illness 07/20/2017   Lower respiratory infection 07/12/2017   Laryngitis, acute 07/12/2017   Cough 07/12/2017   Pleurisy 07/12/2017   Depression 06/03/2015   HTN (hypertension) 01/02/2015   Hyperlipidemia 01/02/2015   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

## 2021-06-10 ENCOUNTER — Ambulatory Visit: Payer: Medicare Other | Admitting: Physical Therapy

## 2021-06-12 ENCOUNTER — Other Ambulatory Visit: Payer: Self-pay

## 2021-06-12 ENCOUNTER — Ambulatory Visit: Payer: Medicare Other | Admitting: Physical Therapy

## 2021-06-12 DIAGNOSIS — R269 Unspecified abnormalities of gait and mobility: Secondary | ICD-10-CM | POA: Diagnosis not present

## 2021-06-12 DIAGNOSIS — R2689 Other abnormalities of gait and mobility: Secondary | ICD-10-CM

## 2021-06-12 DIAGNOSIS — R262 Difficulty in walking, not elsewhere classified: Secondary | ICD-10-CM

## 2021-06-12 DIAGNOSIS — R2681 Unsteadiness on feet: Secondary | ICD-10-CM

## 2021-06-12 NOTE — Therapy (Signed)
[place] MAIN [facility] SERVICES [address] [place], [place], [address] Phone: [phone]   Fax:  [phone]  Physical Therapy Treatment  Patient Details  Name: Nathan Dean MRN: [id number] Date of Birth: [birth year] Referring Provider (PT): Margart Sickles [place]   Encounter Date: 06/12/2021   PT End of Session - 06/12/21 1131     Visit Number 18    Number of Visits 40    Date for PT Re-Evaluation 08/26/21    Authorization Type 1/10 eval 02/24/21 Auth 06/03/21-08/26/21    Progress Note Due on Visit 20    PT Start Time 1047    PT Stop Time 1134    PT Time Calculation (min) 47 min    Equipment Utilized During Treatment Gait belt    Activity Tolerance Patient tolerated treatment well;Patient limited by fatigue    Behavior During Therapy WFL for tasks assessed/performed             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    Diabetes ([place])    GERD (gastroesophageal reflux disease)    Hyperlipidemia    Hypertension    Obesity    Sleep apnea     Past Surgical History:  Procedure Laterality Date   BACK SURGERY     in the 80's -herniated disc   BACK SURGERY     L4 bone spur 1990   CARPAL TUNNEL RELEASE Right 12/28/2016   Procedure: RIGHT ULNAR AND MEDIAN NEUROPLASTY AT WRIST;  Surgeon: Milly Jakob, MD;  Location: [place];  Service: Orthopedics;  Laterality: Right;   CARPAL TUNNEL RELEASE Left    25 years ago   CATARACT EXTRACTION W/ INTRAOCULAR LENS IMPLANT Bilateral    REPLACEMENT TOTAL KNEE BILATERAL  2014   left 2012 rt 2014   SHOULDER OPEN ROTATOR CUFF REPAIR Right 08/07/2019   Procedure: ROTATOR CUFF REPAIR SHOULDER OPEN;  Surgeon: Thornton Park, MD;  Location: [facility];  Service: Orthopedics;  Laterality: Right;   TONSILLECTOMY      There were no vitals filed for this visit.   Subjective Assessment - 06/12/21 1129      Subjective Pt reports no falls since last visit. COntinued shoulder pain that is debilitating at times. Has some R side low back pain.    Patient is accompained by: Family member    Pertinent History Patient has seen this therapist in 2019. Was being treated at [facility] clinic but is transferring to this clinic for balance training. PMH includes sleep apnea, HTN, aortic valve disease, GERD, genituourinary malignancy; renal, hydrocephalus s/p shunt placed 03/2020, DVT, Type II DM, obesity, OA, covid, depression, diabetic polyneuropathy, AD, pressure injury of right foot, and falls. Patient had R rotator cuff surgery a year and a half and has not had a good recovery process.  Patient has a history of falling prior to COVID, falls decreased after shunt but still are present. Will see a podiatrist soon for his pressure injury on R foot. Referral is for diabetic polyneuropathy, foot drop R foot, and falling. Patient reports >30 falls in past 6 months.    Limitations Sitting;Standing;Walking;House hold activities;Other (comment)    How long can you sit comfortably? a few hours    How long can you stand comfortably? immediately unstable    How long can you walk comfortably? immediately unstable    Patient Stated Goals improve leg strength. more independent    Currently in Pain?  Yes    Pain Score 7     Pain Location Shoulder    Pain Orientation Right    Pain Descriptors / Indicators Aching    Pain Onset More than a month ago    Pain Onset In the past 7 days             Exercise/Activity Sets/Reps/Time/ Resistance Assistance Charge type Comments              HS curl 15xL8 12xL9 10xL10 10xL111 minA with positioning   therex   Setup assistance to accommodate his size  In wellzone   Knee extension machine 10 x L 5  2x5x L 6  10 x 5 sec hold at end range L2 minA with positioning   therex   Setup assistance to accommodate his size  In wellzone   Interval training on nustep  -LE only L1 for  rest  L5 for interval (30 sec) 8 min total minA with positioning of LE    therex   In wellzone, encourage to take full rest breaks following high intensity interval -monitor R LE to prevent excessive ER as if too much ER pr knee can skid against nustep handle -supervision level assist getting on/ offf   Ball roll outs- 2 way  10 x 5 sec holds   Therex  For back pain felt with activities completed in HEP as well as with STS activities   Seated QL stretch  30 sec   Therex  Hip adduction on the right with left side trunk lean in seated position    STM and ischemic TP release along left paraspinals  8 min  Therex Significant discomfort in this area  Several Ttp areas, improved following STM and ischemic release                     Treatment Provided this session   Pt educated throughout session about proper posture and technique with exercises. Improved exercise technique, movement at target joints, use of target muscles after min to mod verbal, visual, tactile cues.  Note: Portions of this document were prepared using Dragon voice recognition software and although reviewed may contain unintentional dictation errors in syntax, grammar, or spelling.   Pt required occasional rest breaks due fatigue, PT was quick to ask when pt appeared to be fatiguing in order to prevent excessive fatigue.                           PT Education - 06/12/21 1130     Education Details Exercise form and technique    Person(s) Educated Patient    Methods Explanation    Comprehension Verbalized understanding;Returned demonstration              PT Short Term Goals - 06/03/21 1157       PT SHORT TERM GOAL #1   Title Patient will be independent in home exercise program to improve strength/mobility for better functional independence with ADLs.    Baseline 10/11 HEP given, 06/03/21 updated HEP provided    Time 4    Period Weeks    Status Partially Met    Target Date 07/29/21       PT SHORT TERM GOAL #2   Title Patient will report no falls in past two weeks indicating improved stability and decreased fall risk.    Baseline 10/11; multiple falls 12/2: fall within this week (reports decrease in frequency) 1/18: fall  within past week    Time 4    Period Weeks    Status New    Target Date 03/24/21               PT Long Term Goals - 06/03/21 1355       PT LONG TERM GOAL #1   Title Patient will increase FOTO score to equal to or greater than  58%   to demonstrate statistically significant improvement in mobility and quality of life.    Baseline 10/11: 41%, 04/17/21: 53    Time 12    Period Weeks    Status On-going    Target Date 08/26/21      PT LONG TERM GOAL #2   Title Patient will increase ABC scale score >60% to demonstrate better functional mobility and better confidence with ADLs.     Baseline 10/11: 32.5% 12/2:50.1% 06/03/21: 53.4%    Time 12    Period Weeks    Status On-going    Target Date 08/26/21      PT LONG TERM GOAL #3   Title Patient will increase Berg Balance score by > 6 points (21/56)  to demonstrate decreased fall risk during functional activities.    Baseline 10/11: 15/56 06/03/21: 26    Time 12    Period Weeks    Status On-going    Target Date 08/26/21      PT LONG TERM GOAL #4   Title Patient (> 44 years old) will complete five times sit to stand test in < 20 seconds from 24 inch surface indicating an increased LE strength and improved balance.    Baseline 10/11: unable to perform from standard height chair, 24 inches 24.52 06/03/21: 22.5sec from 26 in table height    Time 12    Period Weeks    Status On-going    Target Date 08/26/21      PT LONG TERM GOAL #5   Title Patient will increase BLE gross strength to 4+/5 as to improve functional strength for independent gait, increased standing tolerance and increased ADL ability.    Baseline 10/11: see note    Time 12    Period Weeks    Status On-going    Target Date 08/26/21                    Plan - 06/12/21 1134     Clinical Impression Statement Pt presents to therapy with excellent motivation for completion of PT program. Pt continues to progress with progressive endurance and strengthening program.  Pt responded well to general lower back sttretching and STm and ischemic TP release for relief of this discmofort. Pt will continue to benefit from skilled PT intervention in order to imporve his LE strength, reduce his fall risk and imrove his overll QOL.    Personal Factors and Comorbidities Age;Comorbidity 3+;Finances;Fitness;Past/Current Experience;Social Background;Time since onset of injury/illness/exacerbation;Transportation    Comorbidities leep apnea, HTN, aortic valve disease, GERD, genituourinary malignancy; renal, hydrocephalus s/p shunt placed 03/2020, DVT, Type II DM, obesity, OA, covid, depression, diabetic polyneuropathy, AD, pressure injury of right foot, and falls    Examination-Activity Limitations Bathing;Bed Mobility;Bend;Caring for Others;Carry;Dressing;Hygiene/Grooming;Stairs;Squat;Sit;Reach Overhead;Locomotion Level;Lift;Stand;Toileting;Transfers    Examination-Participation Restrictions Cleaning;Community Activity;Driving;Interpersonal Relationship;Laundry;Shop;Personal Finances;Occupation;Meal Prep;Volunteer;Yard Work    [employer]chant [employer] officer Evolving/Moderate complexity    Rehab Potential Fair    PT Frequency 2x / week    PT Duration 12 weeks    PT Treatment/Interventions ADLs/Self Care Home Management;Aquatic Therapy;Biofeedback;Electrical Stimulation;Traction;Functional mobility training;Stair training;Gait  training;DME Instruction;Ultrasound;Moist Heat;Therapeutic activities;Therapeutic exercise;Neuromuscular re-education;Balance training;Manual techniques;Orthotic Fit/Training;Patient/family education;Compression bandaging;Passive range of motion;Dry needling;Vestibular;Taping;Vasopneumatic Device;Energy  conservation;Manual lymph drainage;Canalith Repostioning;Visual/perceptual remediation/compensation    PT Next Visit Plan standing balance,Progressive LE strengthening- Focus on eccentric control    PT Home Exercise Plan Handout: clamshells, LAQ, bridges or glute sets, sit to stands    Consulted and Agree with Plan of Care Patient             Patient will benefit from skilled therapeutic intervention in order to improve the following deficits and impairments:  Abnormal gait, Decreased activity tolerance, Decreased balance, Decreased knowledge of precautions, Decreased endurance, Decreased coordination, Decreased mobility, Decreased range of motion, Difficulty walking, Decreased strength, Hypomobility, Impaired flexibility, Impaired perceived functional ability, Impaired sensation, Postural dysfunction, Improper body mechanics, Cardiopulmonary status limiting activity, Impaired UE functional use, Obesity, Pain  Visit Diagnosis: Abnormality of gait and mobility  Difficulty in walking, not elsewhere classified  Unsteadiness on feet  Other abnormalities of gait and mobility     Problem List Patient Active Problem List   Diagnosis Date Noted   Acute diastolic CHF (congestive heart failure) ([place]) 08/17/2019   Edema    Acute metabolic encephalopathy 58/10/3866   Acute pulmonary edema ([place]) 08/12/2019   Acute cor pulmonale (HCC) 08/11/2019   Oxygen desaturation 08/08/2019   S/P right rotator cuff repair 08/07/2019   Hypoxia 10/22/2018   Infection of right prepatellar bursa 10/18/2018   Sepsis ([place]) 10/18/2018   Chest congestion 07/20/2017   Viral illness 07/20/2017   Lower respiratory infection 07/12/2017   Laryngitis, acute 07/12/2017   Cough 07/12/2017   Pleurisy 07/12/2017   Depression 06/03/2015   HTN (hypertension) 01/02/2015   Hyperlipidemia 01/02/2015   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

## 2021-06-17 ENCOUNTER — Other Ambulatory Visit: Payer: Self-pay

## 2021-06-17 ENCOUNTER — Ambulatory Visit: Payer: Medicare Other | Attending: Student | Admitting: Physical Therapy

## 2021-06-17 DIAGNOSIS — R2689 Other abnormalities of gait and mobility: Secondary | ICD-10-CM | POA: Diagnosis present

## 2021-06-17 DIAGNOSIS — R2681 Unsteadiness on feet: Secondary | ICD-10-CM | POA: Diagnosis present

## 2021-06-17 DIAGNOSIS — R262 Difficulty in walking, not elsewhere classified: Secondary | ICD-10-CM | POA: Insufficient documentation

## 2021-06-17 DIAGNOSIS — R269 Unspecified abnormalities of gait and mobility: Secondary | ICD-10-CM | POA: Insufficient documentation

## 2021-06-17 NOTE — Therapy (Signed)
[place] MAIN [facility] SERVICES [place], [place], [address] Phone: [phone]   Fax:  [phone]  Physical Therapy Treatment  Patient Details  Name: Nathan Dean MRN: [id number] Date of Birth: Sep 13, 1955 Referring Provider (PT): Margart Sickles [place]   Encounter Date: 06/17/2021   PT End of Session - 06/17/21 1219     Visit Number 19    Number of Visits 40    Date for PT Re-Evaluation 08/26/21    Authorization Type 1/10 eval 02/24/21 Auth 06/03/21-08/26/21    Progress Note Due on Visit 20    PT Start Time 1135    PT Stop Time 1221    PT Time Calculation (min) 46 min    Equipment Utilized During Treatment Gait belt    Activity Tolerance Patient tolerated treatment well;Patient limited by fatigue    Behavior During Therapy WFL for tasks assessed/performed             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    Diabetes ([place])    GERD (gastroesophageal reflux disease)    Hyperlipidemia    Hypertension    Obesity    Sleep apnea     Past Surgical History:  Procedure Laterality Date   BACK SURGERY     in the 80's -herniated disc   BACK SURGERY     L4 bone spur 1990   CARPAL TUNNEL RELEASE Right 12/28/2016   Procedure: RIGHT ULNAR AND MEDIAN NEUROPLASTY AT WRIST;  Surgeon: Milly Jakob, MD;  Location: [facility];  Service: Orthopedics;  Laterality: Right;   CARPAL TUNNEL RELEASE Left    25 years ago   CATARACT EXTRACTION W/ INTRAOCULAR LENS IMPLANT Bilateral    REPLACEMENT TOTAL KNEE BILATERAL  2014   left 2012 rt 2014   SHOULDER OPEN ROTATOR CUFF REPAIR Right 08/07/2019   Procedure: ROTATOR CUFF REPAIR SHOULDER OPEN;  Surgeon: Thornton Park, MD;  Location: [facility];  Service: Orthopedics;  Laterality: Right;   TONSILLECTOMY      There were no vitals filed for this visit.   Subjective Assessment - 06/17/21 1218      Subjective Pt reports no falls since last visit. Continued shoulder pain that is debilitating at times. Has some R side low back pain.    Patient is accompained by: Family member    Pertinent History Patient has seen this therapist in 2019. Was being treated at [facility] but is transferring to this clinic for balance training. PMH includes sleep apnea, HTN, aortic valve disease, GERD, genituourinary malignancy; renal, hydrocephalus s/p shunt placed 03/2020, DVT, Type II DM, obesity, OA, covid, depression, diabetic polyneuropathy, AD, pressure injury of right foot, and falls. Patient had R rotator cuff surgery a year and a half and has not had a good recovery process.  Patient has a history of falling prior to COVID, falls decreased after shunt but still are present. Will see a podiatrist soon for his pressure injury on R foot. Referral is for diabetic polyneuropathy, foot drop R foot, and falling. Patient reports >30 falls in past 6 months.    Limitations Sitting;Standing;Walking;House hold activities;Other (comment)    How long can you sit comfortably? a few hours    How long can you stand comfortably? immediately unstable    How long can you walk comfortably? immediately unstable    Patient Stated Goals improve leg strength. more independent    Pain Onset More  than a month ago    Pain Onset In the past 7 days             Exercise/Activity Sets/Reps/Time/ Resistance Assistance Charge type Comments              HS curl 15xL9 12xL9 10xL10 10xL11 minA with positioning     therex   Setup assistance to accommodate his size  In wellzone   Knee extension machine 12 x L 5  10x L 6  10 x 5 sec hold at end range L2 minA with positioning     therex   Setup assistance to accommodate his size  In wellzone   Interval training on nustep  -LE only L2 for rest  L6 for interval (30 sec) 8 min total minA with positioning of LE      Therex       In wellzone, encourage to take full  rest breaks following high intensity interval -monitor R LE to prevent excessive ER as if too much ER pr knee can skid against nustep handle -supervision level assist getting on/ off -increased resistance this session   Ball roll outs- 2 way  10 x 5 sec holds   Therex  For back pain felt with activities completed in HEP as well as with STS activities         STM and ischemic TP release along left paraspinals  8 min  Therex Significant discomfort in this area  Several Ttp areas, improved following STM and ischemic release                     Treatment Provided this session   Pt educated throughout session about proper posture and technique with exercises. Improved exercise technique, movement at target joints, use of target muscles after min to mod verbal, visual, tactile cues.  Note: Portions of this document were prepared using Dragon voice recognition software and although reviewed may contain unintentional dictation errors in syntax, grammar, or spelling.   Pt required occasional rest breaks due fatigue, PT was quick to ask when pt appeared to be fatiguing in order to prevent excessive fatigue.                           PT Education - 06/17/21 1218     Education Details Exercise form and technique, lower back anatomy of musculature    Person(s) Educated Patient    Methods Explanation    Comprehension Verbalized understanding              PT Short Term Goals - 06/03/21 1157       PT SHORT TERM GOAL #1   Title Patient will be independent in home exercise program to improve strength/mobility for better functional independence with ADLs.    Baseline 10/11 HEP given, 06/03/21 updated HEP provided    Time 4    Period Weeks    Status Partially Met    Target Date 07/29/21      PT SHORT TERM GOAL #2   Title Patient will report no falls in past two weeks indicating improved stability and decreased fall risk.    Baseline 10/11; multiple falls 12/2: fall  within this week (reports decrease in frequency) 1/18: fall within past week    Time 4    Period Weeks    Status New    Target Date 03/24/21  PT Long Term Goals - 06/03/21 1355       PT LONG TERM GOAL #1   Title Patient will increase FOTO score to equal to or greater than  58%   to demonstrate statistically significant improvement in mobility and quality of life.    Baseline 10/11: 41%, 04/17/21: 53    Time 12    Period Weeks    Status On-going    Target Date 08/26/21      PT LONG TERM GOAL #2   Title Patient will increase ABC scale score >60% to demonstrate better functional mobility and better confidence with ADLs.     Baseline 10/11: 32.5% 12/2:50.1% 06/03/21: 53.4%    Time 12    Period Weeks    Status On-going    Target Date 08/26/21      PT LONG TERM GOAL #3   Title Patient will increase Berg Balance score by > 6 points (21/56)  to demonstrate decreased fall risk during functional activities.    Baseline 10/11: 15/56 06/03/21: 26    Time 12    Period Weeks    Status On-going    Target Date 08/26/21      PT LONG TERM GOAL #4   Title Patient (> 66 years old) will complete five times sit to stand test in < 20 seconds from 24 inch surface indicating an increased LE strength and improved balance.    Baseline 10/11: unable to perform from standard height chair, 24 inches 24.52 06/03/21: 22.5sec from 26 in table height    Time 12    Period Weeks    Status On-going    Target Date 08/26/21      PT LONG TERM GOAL #5   Title Patient will increase BLE gross strength to 4+/5 as to improve functional strength for independent gait, increased standing tolerance and increased ADL ability.    Baseline 10/11: see note    Time 12    Period Weeks    Status On-going    Target Date 08/26/21                   Plan - 06/17/21 1244     Clinical Impression Statement Pt presents to therapy with excellent motivation for completion of PT program. Pt continues to  progress with progressive endurance and strengthening program. Pt responded well to general lower back stretching, STM and ischemic TP release for relief of this discmofort. Pt did demonstrate improved efficay with STS transfers this date as well as hhis fall frequency ( no falls in past several weeks).  Pt will continue to benefit from skilled PT intervention in order to imporve his LE strength, reduce his fall risk and imrove his overll QOL.    Personal Factors and Comorbidities Age;Comorbidity 3+;Finances;Fitness;Past/Current Experience;Social Background;Time since onset of injury/illness/exacerbation;Transportation    Comorbidities leep apnea, HTN, aortic valve disease, GERD, genituourinary malignancy; renal, hydrocephalus s/p shunt placed 03/2020, DVT, Type II DM, obesity, OA, covid, depression, diabetic polyneuropathy, AD, pressure injury of right foot, and falls    Examination-Activity Limitations Bathing;Bed Mobility;Bend;Caring for Others;Carry;Dressing;Hygiene/Grooming;Stairs;Squat;Sit;Reach Overhead;Locomotion Level;Lift;Stand;Toileting;Transfers    Examination-Participation Restrictions Cleaning;Community Activity;Driving;Interpersonal Relationship;Laundry;Shop;Personal Finances;Occupation;Meal Prep;Volunteer;Yard Work    [employer] Evolving/Moderate complexity    Rehab Potential Fair    PT Frequency 2x / week    PT Duration 12 weeks    PT Treatment/Interventions ADLs/Self Care Home Management;Aquatic Therapy;Biofeedback;Electrical Stimulation;Traction;Functional mobility training;Stair training;Gait training;DME Instruction;Ultrasound;Moist Heat;Therapeutic activities;Therapeutic exercise;Neuromuscular re-education;Balance training;Manual techniques;Orthotic Fit/Training;Patient/family education;Compression bandaging;Passive range of motion;Dry needling;Vestibular;Taping;Vasopneumatic Device;Energy  conservation;Manual lymph drainage;Canalith  Repostioning;Visual/perceptual remediation/compensation    PT Next Visit Plan standing balance,Progressive LE strengthening- Focus on eccentric control    PT Home Exercise Plan Handout: clamshells, LAQ, bridges or glute sets, sit to stands    Consulted and Agree with Plan of Care Patient             Patient will benefit from skilled therapeutic intervention in order to improve the following deficits and impairments:  Abnormal gait, Decreased activity tolerance, Decreased balance, Decreased knowledge of precautions, Decreased endurance, Decreased coordination, Decreased mobility, Decreased range of motion, Difficulty walking, Decreased strength, Hypomobility, Impaired flexibility, Impaired perceived functional ability, Impaired sensation, Postural dysfunction, Improper body mechanics, Cardiopulmonary status limiting activity, Impaired UE functional use, Obesity, Pain  Visit Diagnosis: Abnormality of gait and mobility  Difficulty in walking, not elsewhere classified  Unsteadiness on feet  Other abnormalities of gait and mobility     Problem List Patient Active Problem List   Diagnosis Date Noted   Acute diastolic CHF (congestive heart failure) ([place]) 08/17/2019   Edema    Acute metabolic encephalopathy 78/58/8502   Acute pulmonary edema ([place]) 08/12/2019   Acute cor pulmonale ([facility]) 08/11/2019   Oxygen desaturation 08/08/2019   S/P right rotator cuff repair 08/07/2019   Hypoxia 10/22/2018   Infection of right prepatellar bursa 10/18/2018   Sepsis ([place]) 10/18/2018   Chest congestion 07/20/2017   Viral illness 07/20/2017   Lower respiratory infection 07/12/2017   Laryngitis, acute 07/12/2017   Cough 07/12/2017   Pleurisy 07/12/2017   Depression 06/03/2015   HTN (hypertension) 01/02/2015   Hyperlipidemia 01/02/2015   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

## 2021-06-19 ENCOUNTER — Telehealth: Payer: Medicare Other | Admitting: Family Medicine

## 2021-06-19 ENCOUNTER — Ambulatory Visit: Payer: Medicare Other | Admitting: Physical Therapy

## 2021-06-19 NOTE — Progress Notes (Signed)
   Appears pt is at a [facility] in office during this visit. Being seen for same symptoms listed for reason of this visit.

## 2021-06-24 ENCOUNTER — Ambulatory Visit: Payer: Medicare Other

## 2021-06-26 ENCOUNTER — Other Ambulatory Visit: Payer: Self-pay

## 2021-06-26 ENCOUNTER — Ambulatory Visit: Payer: Medicare Other | Admitting: Physical Therapy

## 2021-06-26 ENCOUNTER — Ambulatory Visit: Payer: Medicare Other | Admitting: Podiatry

## 2021-06-26 DIAGNOSIS — R269 Unspecified abnormalities of gait and mobility: Secondary | ICD-10-CM | POA: Diagnosis not present

## 2021-06-26 DIAGNOSIS — R2681 Unsteadiness on feet: Secondary | ICD-10-CM

## 2021-06-26 DIAGNOSIS — R2689 Other abnormalities of gait and mobility: Secondary | ICD-10-CM

## 2021-06-26 DIAGNOSIS — R262 Difficulty in walking, not elsewhere classified: Secondary | ICD-10-CM

## 2021-06-26 NOTE — Therapy (Signed)
[place] [facility] [address] [place], [place], [address] Phone: [phone]   Fax:  [phone]  Physical Therapy Treatment/ Physical Therapy Progress Note   Dates of reporting period  04/17/21   to   06/26/21   Patient Details  Name: Nathan Dean MRN: [id number] Date of Birth: 1955/10/05 Referring Provider (PT): Margart Sickles [place]   Encounter Date: 06/26/2021   PT End of Session - 06/26/21 0955     Visit Number 20    Number of Visits 40    Date for PT Re-Evaluation 08/26/21    Authorization Type 1/10 eval 02/24/21 Auth 06/03/21-08/26/21    Progress Note Due on Visit 20    PT Start Time 0958    PT Stop Time 1045    PT Time Calculation (min) 47 min    Equipment Utilized During Treatment Gait belt    Activity Tolerance Patient tolerated treatment well;Patient limited by fatigue    Behavior During Therapy WFL for tasks assessed/performed             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    Diabetes ([place])    GERD (gastroesophageal reflux disease)    Hyperlipidemia    Hypertension    Obesity    Sleep apnea     Past Surgical History:  Procedure Laterality Date   BACK SURGERY     in the 80's -herniated disc   BACK SURGERY     L4 bone spur 1990   CARPAL TUNNEL RELEASE Right 12/28/2016   Procedure: RIGHT ULNAR AND MEDIAN NEUROPLASTY AT WRIST;  Surgeon: Milly Jakob, MD;  Location: [place];  Service: Orthopedics;  Laterality: Right;   CARPAL TUNNEL RELEASE Left    25 years ago   CATARACT EXTRACTION W/ INTRAOCULAR LENS IMPLANT Bilateral    REPLACEMENT TOTAL KNEE BILATERAL  2014   left 2012 rt 2014   SHOULDER OPEN ROTATOR CUFF REPAIR Right 08/07/2019   Procedure: ROTATOR CUFF REPAIR SHOULDER OPEN;  Surgeon: Thornton Park, MD;  Location: [facility];  Service: Orthopedics;  Laterality: Right;   TONSILLECTOMY      There were  no vitals filed for this visit.   Subjective Assessment - 06/26/21 0955     Subjective Pt reports no falls since last visit. Continued shoulder pain that is debilitating at times. Has some R side low back pain.    Patient is accompained by: Family member    Pertinent History Patient has seen this therapist in 2019. Was being treated at [facility] but is transferring to this clinic for balance training. PMH includes sleep apnea, HTN, aortic valve disease, GERD, genituourinary malignancy; renal, hydrocephalus s/p shunt placed 03/2020, DVT, Type II DM, obesity, OA, covid, depression, diabetic polyneuropathy, AD, pressure injury of right foot, and falls. Patient had R rotator cuff surgery a year and a half and has not had a good recovery process.  Patient has a history of falling prior to COVID, falls decreased after shunt but still are present. Will see a podiatrist soon for his pressure injury on R foot. Referral is for diabetic polyneuropathy, foot drop R foot, and falling. Patient reports >30 falls in past 6 months.    Limitations Sitting;Standing;Walking;House hold activities;Other (comment)    How long can you sit comfortably? a few hours    How long can you stand comfortably? immediately unstable    How long can you walk comfortably?  immediately unstable    Patient Stated Goals improve leg strength. more independent    Pain Onset More than a month ago    Pain Onset In the past 7 days                [facility] PT Assessment - 06/26/21 0001       Berg Balance Test   Sit to Stand Able to stand  independently using hands    Standing Unsupported Able to stand 30 seconds unsupported    Sitting with Back Unsupported but Feet Supported on Floor or Stool Able to sit safely and securely 2 minutes    Stand to Sit Controls descent by using hands    Transfers Able to transfer safely, definite need of hands    Standing Unsupported with Eyes Closed Able to stand 3 seconds    Standing Unsupported  with Feet Together Able to place feet together independently and stand for 1 minute with supervision    From Standing, Reach Forward with Outstretched Arm Can reach forward >12 cm safely (5")    From Standing Position, Pick up Object from Floor Unable to try/needs assist to keep balance    From Standing Position, Turn to Look Behind Over each Shoulder Turn sideways only but maintains balance    Turn 360 Degrees Needs assistance while turning    Standing Unsupported, Alternately Place Feet on Step/Stool Needs assistance to keep from falling or unable to try    Standing Unsupported, One Foot in [place] to take small step independently and hold 30 seconds    Standing on One Leg Unable to try or needs assist to prevent fall    Total Score 27              Physical therapy treatment session today consisted of completing assessment of goals and administration of testing as demonstrated in flow sheet and in goals section of this note. Addition treatments may be found below.  Exercise/Activity Sets/Reps/Time/ Resistance Assistance Charge type Comments                          Interval training on nustep  -LE only L2 for rest  L6 for interval (30 sec) 8 min total minA with positioning of LE      Therex       In wellzone, encourage to take full rest breaks following high intensity interval -monitor R LE to prevent excessive ER as if too much ER pr knee can skid against nustep handle -supervision level assist getting on/ off -increased resistance this session   Ball roll outs- 2 way  10 x 5 sec holds   Therex  For back pain felt with activities completed in HEP as well as with STS activities                                 Treatment Provided this session   Pt educated throughout session about proper posture and technique with exercises. Improved exercise technique, movement at target joints, use of target muscles after min to mod verbal, visual, tactile cues.  Note: Portions  of this document were prepared using Dragon voice recognition software and although reviewed may contain unintentional dictation errors in syntax, grammar, or spelling.                        PT Education -  06/26/21 0955     Education Details Progress with therapy this far    Person(s) Educated Patient    Methods Explanation    Comprehension Verbalized understanding              PT Short Term Goals - 06/26/21 0956       PT SHORT TERM GOAL #1   Title Patient will be independent in home exercise program to improve strength/mobility for better functional independence with ADLs.    Baseline 10/11 HEP given, 06/03/21 updated HEP provided, 2/10 HEP going well.    Time 4    Period Weeks    Status Achieved    Target Date 07/29/21      PT SHORT TERM GOAL #2   Title Patient will report no falls in past two weeks indicating improved stability and decreased fall risk.    Baseline 10/11; multiple falls 12/2: fall within this week (reports decrease in frequency) 1/18: fall within past week 2/10 no falls in several weeks, ambulating with cane around the home.    Time 4    Period Weeks    Status Achieved    Target Date 03/24/21               PT Long Term Goals - 06/26/21 1002       PT LONG TERM GOAL #1   Title Patient will increase FOTO score to equal to or greater than  58%   to demonstrate statistically significant improvement in mobility and quality of life.    Baseline 10/11: 41%, 04/17/21: 53 2/10: 53%    Time 12    Period Weeks    Status On-going    Target Date 08/26/21      PT LONG TERM GOAL #2   Title Patient will increase ABC scale score >60% to demonstrate better functional mobility and better confidence with ADLs.     Baseline 10/11: 32.5% 12/2:50.1% 06/03/21: 53.4%    Time 12    Period Weeks    Status On-going    Target Date 08/26/21      PT LONG TERM GOAL #3   Title Patient will increase Berg Balance score by > 6 points (21/56)  to  demonstrate decreased fall risk during functional activities.    Baseline 10/11: 15/56 06/03/21: 26    Time 12    Period Weeks    Status On-going    Target Date 08/26/21      PT LONG TERM GOAL #4   Title Patient (> 71 years old) will complete five times sit to stand test in < 20 seconds from 24 inch surface indicating an increased LE strength and improved balance.    Baseline 10/11: unable to perform from standard height chair, 24 inches 24.52 06/03/21: 22.5sec from 26 in table height 06/26/21: 16.9s from 24 inch table height    Time 12    Period Weeks    Status Partially Met    Target Date 08/26/21      PT LONG TERM GOAL #5   Title Patient will increase BLE gross strength to 4+/5 ( excluding ankle strength)  to improve functional strength for independent gait, increased standing tolerance and increased ADL ability.    Baseline 10/11: see note 2/10: not tested    Time 12    Period Weeks    Status On-going    Target Date 08/26/21                   Plan -  06/26/21 0956     Clinical Impression Statement Patient presents to physical therapy for progress note following 20 physical therapy treatment sessions.  During physical therapy treatment patient has had less frequency of falling and is also demonstrated improved lower extremity strength as evidenced by 5 times sit to stand.  Between this and last progress note patient is made significant progress with his Berg balance test although still has significant impairments increasing his risk for falls.  Patient is planning to have shoulder surgery at some point in the near future and his balance is improved to a point where he would be more safe during his procedure done, however, patient is still at risk for falls and has been recommended for home health physical therapy until he can safely ambulate at this rollator.  Patient has made good progress overall with his focus on therapeutic outcome survey as well as his activity specific  balance scale but still has room to improve with both of these outcome measures.  Patient will continue to benefit from skilled physical therapy intervention in order to improve his lower extremity strength, balance, mobility, and to improve his fall risk. Patient's condition has the potential to improve in response to therapy. Maximum improvement is yet to be obtained. The anticipated improvement is attainable and reasonable in a generally predictable time.      Personal Factors and Comorbidities Age;Comorbidity 3+;Finances;Fitness;Past/Current Experience;Social Background;Time since onset of injury/illness/exacerbation;Transportation    Comorbidities leep apnea, HTN, aortic valve disease, GERD, genituourinary malignancy; renal, hydrocephalus s/p shunt placed 03/2020, DVT, Type II DM, obesity, OA, covid, depression, diabetic polyneuropathy, AD, pressure injury of right foot, and falls    Examination-Activity Limitations Bathing;Bed Mobility;Bend;Caring for Others;Carry;Dressing;Hygiene/Grooming;Stairs;Squat;Sit;Reach Overhead;Locomotion Level;Lift;Stand;Toileting;Transfers    Examination-Participation Restrictions Cleaning;Community Activity;Driving;Interpersonal Relationship;Laundry;Shop;Personal Finances;Occupation;Meal Prep;Volunteer;Yard Work    [employer] Evolving/Moderate complexity    Rehab Potential Fair    PT Frequency 2x / week    PT Duration 12 weeks    PT Treatment/Interventions ADLs/Self Care Home Management;Aquatic Therapy;Biofeedback;Electrical Stimulation;Traction;Functional mobility training;Stair training;Gait training;DME Instruction;Ultrasound;Moist Heat;Therapeutic activities;Therapeutic exercise;Neuromuscular re-education;Balance training;Manual techniques;Orthotic Fit/Training;Patient/family education;Compression bandaging;Passive range of motion;Dry needling;Vestibular;Taping;Vasopneumatic Device;Energy conservation;Manual lymph drainage;Canalith  Repostioning;Visual/perceptual remediation/compensation    PT Next Visit Plan standing balance,Progressive LE strengthening- Focus on eccentric control    PT Home Exercise Plan Handout: clamshells, LAQ, bridges or glute sets, sit to stands    Consulted and Agree with Plan of Care Patient             Patient will benefit from skilled therapeutic intervention in order to improve the following deficits and impairments:  Abnormal gait, Decreased activity tolerance, Decreased balance, Decreased knowledge of precautions, Decreased endurance, Decreased coordination, Decreased mobility, Decreased range of motion, Difficulty walking, Decreased strength, Hypomobility, Impaired flexibility, Impaired perceived functional ability, Impaired sensation, Postural dysfunction, Improper body mechanics, Cardiopulmonary status limiting activity, Impaired UE functional use, Obesity, Pain  Visit Diagnosis: Abnormality of gait and mobility  Difficulty in walking, not elsewhere classified  Unsteadiness on feet  Other abnormalities of gait and mobility     Problem List Patient Active Problem List   Diagnosis Date Noted   Acute diastolic CHF (congestive heart failure) ([place]) 08/17/2019   Edema    Acute metabolic encephalopathy 87/68/1157   Acute pulmonary edema ([place]) 08/12/2019   Acute cor pulmonale ([facility]) 08/11/2019   Oxygen desaturation 08/08/2019   S/P right rotator cuff repair 08/07/2019   Hypoxia 10/22/2018   Infection of right prepatellar bursa 10/18/2018   Sepsis ([place]) 10/18/2018   Chest congestion 07/20/2017  Viral illness 07/20/2017   Lower respiratory infection 07/12/2017   Laryngitis, acute 07/12/2017   Cough 07/12/2017   Pleurisy 07/12/2017   Depression 06/03/2015   HTN (hypertension) 01/02/2015   Hyperlipidemia 01/02/2015   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

## 2021-07-01 ENCOUNTER — Ambulatory Visit: Payer: Medicare Other

## 2021-07-01 ENCOUNTER — Other Ambulatory Visit: Payer: Self-pay

## 2021-07-01 DIAGNOSIS — R2681 Unsteadiness on feet: Secondary | ICD-10-CM

## 2021-07-01 DIAGNOSIS — R269 Unspecified abnormalities of gait and mobility: Secondary | ICD-10-CM

## 2021-07-01 DIAGNOSIS — R262 Difficulty in walking, not elsewhere classified: Secondary | ICD-10-CM

## 2021-07-01 NOTE — Therapy (Signed)
[place] MAIN [facility] SERVICES [address] [place], [place], [address] Phone: [phone]   Fax:  [phone]  Physical Therapy Treatment  Patient Details  Name: Nathan Dean MRN: [id number] Date of Birth: May 10, 1956 Referring Provider (PT): Margart Sickles [place]   Encounter Date: 07/01/2021   PT End of Session - 07/01/21 1135     Visit Number 21    Number of Visits 40    Date for PT Re-Evaluation 08/26/21    Authorization Type 1/10 eval 02/24/21 Auth 06/03/21-08/26/21    Progress Note Due on Visit 20    PT Start Time 1015    PT Stop Time 1059    PT Time Calculation (min) 44 min    Equipment Utilized During Treatment Gait belt    Activity Tolerance Patient tolerated treatment well;Patient limited by fatigue    Behavior During Therapy WFL for tasks assessed/performed             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;  Service: Orthopedics;  Laterality: Right;   CARPAL TUNNEL RELEASE Left    25 years ago   CATARACT EXTRACTION W/ INTRAOCULAR LENS IMPLANT Bilateral    REPLACEMENT TOTAL KNEE BILATERAL  2014   left 2012 rt 2014   SHOULDER OPEN ROTATOR CUFF REPAIR Right 08/07/2019   Procedure: ROTATOR CUFF REPAIR SHOULDER OPEN;  Surgeon: Thornton Park, MD;  Location: [facility];  Service: Orthopedics;  Laterality: Right;   TONSILLECTOMY      There were no vitals filed for this visit.   Subjective Assessment - 07/01/21 1130      Subjective Patient reports he had a fall yesterday when doing laundry. Wants to bring a sling in to practice walking with sling for after his surgery.    Patient is accompained by: Family member    Pertinent History Patient has seen this therapist in 2019. Was being treated at [facility] clinic but is transferring to this clinic for balance training. PMH includes sleep apnea, HTN, aortic valve disease, GERD, genituourinary malignancy; renal, hydrocephalus s/p shunt placed 03/2020, DVT, Type II DM, obesity, OA, covid, depression, diabetic polyneuropathy, AD, pressure injury of right foot, and falls. Patient had R rotator cuff surgery a year and a half and has not had a good recovery process.  Patient has a history of falling prior to COVID, falls decreased after shunt but still are present. Will see a podiatrist soon for his pressure injury on R foot. Referral is for diabetic polyneuropathy, foot drop R foot, and falling. Patient reports >30 falls in past 6 months.    Limitations Sitting;Standing;Walking;House hold activities;Other (comment)    How long can you sit comfortably? a few hours    How long can you stand comfortably? immediately unstable    How long can you walk comfortably? immediately unstable    Patient Stated Goals improve leg strength. more independent  Currently in Pain? Yes    Pain Score 7     Pain Location Shoulder    Pain Orientation Right    Pain Descriptors / Indicators Aching    Pain Type Chronic pain    Pain Onset More than a month ago    Pain Frequency Intermittent                Exercise/Activity Sets/Reps/Time/ Resistance Assistance Charge type Comments  HS curl  10xL10 10xL11 10x L12 minA with positioning       therex   Setup assistance to accommodate his size  In wellzone   Knee extension machine 12 x L 5  10x L 6  10 x 5 sec hold at end range L2 minA with positioning       therex   Setup assistance to accommodate his size  In wellzone    Interval training on nustep  -LE only L2 for rest  L6 for interval (30 sec on 1 minute slow) 8 min total minA with positioning of LE        Therex           In wellzone, encourage to take full rest breaks following high intensity interval -monitor R LE to prevent excessive ER as if too much ER pr knee can skid against nustep handle -supervision level assist getting on/ off -increased resistance this session    Sit to stand from alternating height table with focus on decreasing UE support 5 reps; x 2 sets alternating heights Close CGA; max cueing for sequencing for body shift therEx Set up and sequencing assistance for forward weight momentum, use of Ue's as needed         Treatment Provided this session    Pt educated throughout session about proper posture and technique with exercises. Improved exercise technique, movement at target joints, use of target muscles after min to mod verbal, visual, tactile cues.     Patient tolerates increased resistance for LE strengthening interventions this session. He does have worries about shoulder surgery and is worried about his balance with the sling. Discussion on potential bringing sling in future session. Sit to stands are improving with cueing for forward weight momentum. Pt will continue to benefit from skilled PT intervention in order to imporve his LE strength, reduce his fall risk and imrove his overll QOL         PT Education - 07/01/21 1134     Education Details exercise technique, body mechanics    Person(s) Educated Patient    Methods Explanation;Demonstration;Tactile cues;Verbal cues    Comprehension Verbalized understanding;Returned demonstration;Verbal cues required;Tactile cues required              PT Short Term Goals - 06/26/21 0956       PT SHORT TERM GOAL #1   Title Patient will be independent in home exercise program to improve strength/mobility for better functional independence with ADLs.    Baseline  10/11 HEP given, 06/03/21 updated HEP provided, 2/10 HEP going well.    Time 4    Period Weeks    Status Achieved    Target Date 07/29/21      PT SHORT TERM GOAL #2   Title Patient will report no falls in past two weeks indicating improved stability and decreased fall risk.    Baseline 10/11; multiple falls 12/2: fall within this week (reports decrease in frequency) 1/18: fall within past week 2/10 no falls in several weeks, ambulating with cane around  the home.    Time 4    Period Weeks    Status Achieved    Target Date 03/24/21               PT Long Term Goals - 06/26/21 1002       PT LONG TERM GOAL #1   Title Patient will increase FOTO score to equal to or greater than  58%   to demonstrate statistically significant improvement in mobility and quality of life.    Baseline 10/11: 41%, 04/17/21: 53 2/10: 53%    Time 12    Period Weeks    Status On-going    Target Date 08/26/21      PT LONG TERM GOAL #2   Title Patient will increase ABC scale score >60% to demonstrate better functional mobility and better confidence with ADLs.     Baseline 10/11: 32.5% 12/2:50.1% 06/03/21: 53.4%    Time 12    Period Weeks    Status On-going    Target Date 08/26/21      PT LONG TERM GOAL #3   Title Patient will increase Berg Balance score by > 6 points (21/56)  to demonstrate decreased fall risk during functional activities.    Baseline 10/11: 15/56 06/03/21: 26    Time 12    Period Weeks    Status On-going    Target Date 08/26/21      PT LONG TERM GOAL #4   Title Patient (> 32 years old) will complete five times sit to stand test in < 20 seconds from 24 inch surface indicating an increased LE strength and improved balance.    Baseline 10/11: unable to perform from standard height chair, 24 inches 24.52 06/03/21: 22.5sec from 26 in table height 06/26/21: 16.9s from 24 inch table height    Time 12    Period Weeks    Status Partially Met    Target Date 08/26/21      PT LONG TERM GOAL #5    Title Patient will increase BLE gross strength to 4+/5 ( excluding ankle strength)  to improve functional strength for independent gait, increased standing tolerance and increased ADL ability.    Baseline 10/11: see note 2/10: not tested    Time 12    Period Weeks    Status On-going    Target Date 08/26/21                   Plan - 07/01/21 1141     Clinical Impression Statement Patient tolerates increased resistance for LE strengthening interventions this session. He does have worries about shoulder surgery and is worried about his balance with the sling. Discussion on potential bringing sling in future session. Sit to stands are improving with cueing for forward weight momentum. Pt will continue to benefit from skilled PT intervention in order to imporve his LE strength, reduce his fall risk and imrove his overall QOL    Personal Factors and Comorbidities Age;Comorbidity 3+;Finances;Fitness;Past/Current Experience;Social Background;Time since onset of injury/illness/exacerbation;Transportation    Comorbidities leep apnea, HTN, aortic valve disease, GERD, genituourinary malignancy; renal, hydrocephalus s/p shunt placed 03/2020, DVT, Type II DM, obesity, OA, covid, depression, diabetic polyneuropathy, AD, pressure injury of right foot, and falls    Examination-Activity Limitations Bathing;Bed Mobility;Bend;Caring for Others;Carry;Dressing;Hygiene/Grooming;Stairs;Squat;Sit;Reach Overhead;Locomotion Level;Lift;Stand;Toileting;Transfers    Examination-Participation Restrictions Cleaning;Community Activity;Driving;Interpersonal Relationship;Laundry;Shop;Personal Finances;Occupation;Meal Prep;Volunteer;Yard Work    Product manager    Rehab Potential Fair    PT Frequency 2x / week  PT Duration 12 weeks    PT Treatment/Interventions ADLs/Self Care Home Management;Aquatic Therapy;Biofeedback;Electrical Stimulation;Traction;Functional mobility  training;Stair training;Gait training;DME Instruction;Ultrasound;Moist Heat;Therapeutic activities;Therapeutic exercise;Neuromuscular re-education;Balance training;Manual techniques;Orthotic Fit/Training;Patient/family education;Compression bandaging;Passive range of motion;Dry needling;Vestibular;Taping;Vasopneumatic Device;Energy conservation;Manual lymph drainage;Canalith Repostioning;Visual/perceptual remediation/compensation    PT Next Visit Plan standing balance,Progressive LE strengthening- Focus on eccentric control    PT Home Exercise Plan Handout: clamshells, LAQ, bridges or glute sets, sit to stands    Consulted and Agree with Plan of Care Patient             Patient will benefit from skilled therapeutic intervention in order to improve the following deficits and impairments:  Abnormal gait, Decreased activity tolerance, Decreased balance, Decreased knowledge of precautions, Decreased endurance, Decreased coordination, Decreased mobility, Decreased range of motion, Difficulty walking, Decreased strength, Hypomobility, Impaired flexibility, Impaired perceived functional ability, Impaired sensation, Postural dysfunction, Improper body mechanics, Cardiopulmonary status limiting activity, Impaired UE functional use, Obesity, Pain  Visit Diagnosis: Abnormality of gait and mobility  Difficulty in walking, not elsewhere classified  Unsteadiness on feet     Problem List Patient Active Problem List   Diagnosis Date Noted   Acute diastolic CHF (congestive heart failure) ([place]) 08/17/2019   Edema    Acute metabolic encephalopathy 32/04/2481   Acute pulmonary edema ([place]) 08/12/2019   Acute cor pulmonale (HCC) 08/11/2019   Oxygen desaturation 08/08/2019   S/P right rotator cuff repair 08/07/2019   Hypoxia 10/22/2018   Infection of right prepatellar bursa 10/18/2018   Sepsis ([place]) 10/18/2018   Chest congestion 07/20/2017   Viral illness 07/20/2017   Lower respiratory infection  07/12/2017   Laryngitis, acute 07/12/2017   Cough 07/12/2017   Pleurisy 07/12/2017   Depression 06/03/2015   HTN (hypertension) 01/02/2015   Hyperlipidemia 01/02/2015   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 [place], [place], [address] Phone: [phone]   Fax:  [phone]  Name: Nathan Dean MRN: [id number] Date of Birth: [birth year]

## 2021-07-03 ENCOUNTER — Encounter: Payer: Self-pay | Admitting: Podiatry

## 2021-07-03 ENCOUNTER — Ambulatory Visit: Payer: Medicare Other | Admitting: Physical Therapy

## 2021-07-03 ENCOUNTER — Ambulatory Visit (INDEPENDENT_AMBULATORY_CARE_PROVIDER_SITE_OTHER): Payer: Medicare Other | Admitting: Podiatry

## 2021-07-03 ENCOUNTER — Other Ambulatory Visit: Payer: Self-pay

## 2021-07-03 DIAGNOSIS — E0843 Diabetes mellitus due to underlying condition with diabetic autonomic (poly)neuropathy: Secondary | ICD-10-CM | POA: Diagnosis not present

## 2021-07-03 DIAGNOSIS — L97512 Non-pressure chronic ulcer of other part of right foot with fat layer exposed: Secondary | ICD-10-CM | POA: Diagnosis not present

## 2021-07-08 ENCOUNTER — Ambulatory Visit: Payer: Medicare Other | Admitting: Physical Therapy

## 2021-07-08 ENCOUNTER — Encounter: Payer: Self-pay | Admitting: Physical Therapy

## 2021-07-08 ENCOUNTER — Other Ambulatory Visit: Payer: Self-pay

## 2021-07-08 DIAGNOSIS — R262 Difficulty in walking, not elsewhere classified: Secondary | ICD-10-CM

## 2021-07-08 DIAGNOSIS — R269 Unspecified abnormalities of gait and mobility: Secondary | ICD-10-CM

## 2021-07-08 DIAGNOSIS — R2681 Unsteadiness on feet: Secondary | ICD-10-CM

## 2021-07-08 NOTE — Therapy (Signed)
[place] MAIN [facility] SERVICES [address] [place], [place], [address] Phone: [phone]   Fax:  [phone]  Physical Therapy Treatment  Patient Details  Name: Nathan Dean MRN: [id number] Date of Birth: 1956-03-23 Referring Provider (PT): Margart Sickles [place]   Encounter Date: 07/08/2021   PT End of Session - 07/08/21 1431     Visit Number 22    Number of Visits 40    Date for PT Re-Evaluation 08/26/21    Authorization Type 1/10 eval 02/24/21 Auth 06/03/21-08/26/21    Progress Note Due on Visit 30    PT Start Time 1016    PT Stop Time 1059    PT Time Calculation (min) 43 min    Equipment Utilized During Treatment Gait belt    Activity Tolerance Patient tolerated treatment well;Patient limited by fatigue    Behavior During Therapy WFL for tasks assessed/performed             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    Diabetes ([place])    GERD (gastroesophageal reflux disease)    Hyperlipidemia    Hypertension    Obesity    Sleep apnea     Past Surgical History:  Procedure Laterality Date   BACK SURGERY     in the 80's -herniated disc   BACK SURGERY     L4 bone spur 1990   CARPAL TUNNEL RELEASE Right 12/28/2016   Procedure: RIGHT ULNAR AND MEDIAN NEUROPLASTY AT WRIST;  Surgeon: Milly Jakob, MD;  Location: [place];  Service: Orthopedics;  Laterality: Right;   CARPAL TUNNEL RELEASE Left    25 years ago   CATARACT EXTRACTION W/ INTRAOCULAR LENS IMPLANT Bilateral    REPLACEMENT TOTAL KNEE BILATERAL  2014   left 2012 rt 2014   SHOULDER OPEN ROTATOR CUFF REPAIR Right 08/07/2019   Procedure: ROTATOR CUFF REPAIR SHOULDER OPEN;  Surgeon: Thornton Park, MD;  Location: [facility];  Service: Orthopedics;  Laterality: Right;   TONSILLECTOMY      There were no vitals filed for this visit.   Subjective Assessment - 07/08/21 1054      Subjective Pt reports no falls or LOB since previous session. Pt reports 1 minor LOB since last session but was able to catch himself without complete LOB.    Patient is accompained by: Family member    Pertinent History Patient has seen this therapist in 2019. Was being treated at [facility] clinic but is transferring to this clinic for balance training. PMH includes sleep apnea, HTN, aortic valve disease, GERD, genituourinary malignancy; renal, hydrocephalus s/p shunt placed 03/2020, DVT, Type II DM, obesity, OA, covid, depression, diabetic polyneuropathy, AD, pressure injury of right foot, and falls. Patient had R rotator cuff surgery a year and a half and has not had a good recovery process.  Patient has a history of falling prior to COVID, falls decreased after shunt but still are present. Will see a podiatrist soon for his pressure injury on R foot. Referral is for diabetic polyneuropathy, foot drop R foot, and falling. Patient reports >30 falls in past 6 months.    Limitations Sitting;Standing;Walking;House hold activities;Other (comment)    How long can you sit comfortably? a few hours    How long can you stand comfortably? immediately unstable    How long can you walk comfortably? immediately unstable    Patient Stated Goals improve leg strength. more independent  Pain Onset More than a month ago               Exercise/Activity Sets/Reps/Time/ Resistance Assistance Charge type Comments              HS curl 15xL9 12xL9 10xL10 10xL11 minA with positioning     therex   Setup assistance to accommodate his size  In wellzone   Knee extension machine 12 x L 5  10x L 6  10 x 5 sec hold at end range L2 minA with positioning     therex   Setup assistance to accommodate his size  In wellzone   Interval training on nustep  -LE only L2 for rest  L6 for interval (30 sec) 8 min total minA with positioning of LE      Therex       In wellzone, encourage to take full rest  breaks following high intensity interval -monitor R LE to prevent excessive ER as if too much ER pr knee can skid against nustep handle -supervision level assist getting on/ off -increased resistance this session   STS  3 x 5 reps @ 23 in table height   Therex  Used hands on supportive surface, fatigue noted near end of sets.                                Treatment Provided this session   Pt educated throughout session about proper posture and technique with exercises. Improved exercise technique, movement at target joints, use of target muscles after min to mod verbal, visual, tactile cues.  Note: Portions of this document were prepared using Dragon voice recognition software and although reviewed may contain unintentional dictation errors in syntax, grammar, or spelling.   Pt required occasional rest breaks due fatigue, PT was quick to ask when pt appeared to be fatiguing in order to prevent excessive fatigue.                             PT Short Term Goals - 06/26/21 0956       PT SHORT TERM GOAL #1   Title Patient will be independent in home exercise program to improve strength/mobility for better functional independence with ADLs.    Baseline 10/11 HEP given, 06/03/21 updated HEP provided, 2/10 HEP going well.    Time 4    Period Weeks    Status Achieved    Target Date 07/29/21      PT SHORT TERM GOAL #2   Title Patient will report no falls in past two weeks indicating improved stability and decreased fall risk.    Baseline 10/11; multiple falls 12/2: fall within this week (reports decrease in frequency) 1/18: fall within past week 2/10 no falls in several weeks, ambulating with cane around the home.    Time 4    Period Weeks    Status Achieved    Target Date 03/24/21               PT Long Term Goals - 06/26/21 1002       PT LONG TERM GOAL #1   Title Patient will increase FOTO score to equal to or greater than  58%   to demonstrate  statistically significant improvement in mobility and quality of life.    Baseline 10/11: 41%, 04/17/21: 53 2/10: 53%    Time 12  Period Weeks    Status On-going    Target Date 08/26/21      PT LONG TERM GOAL #2   Title Patient will increase ABC scale score >60% to demonstrate better functional mobility and better confidence with ADLs.     Baseline 10/11: 32.5% 12/2:50.1% 06/03/21: 53.4%    Time 12    Period Weeks    Status On-going    Target Date 08/26/21      PT LONG TERM GOAL #3   Title Patient will increase Berg Balance score by > 6 points (21/56)  to demonstrate decreased fall risk during functional activities.    Baseline 10/11: 15/56 06/03/21: 26    Time 12    Period Weeks    Status On-going    Target Date 08/26/21      PT LONG TERM GOAL #4   Title Patient (> 41 years old) will complete five times sit to stand test in < 20 seconds from 24 inch surface indicating an increased LE strength and improved balance.    Baseline 10/11: unable to perform from standard height chair, 24 inches 24.52 06/03/21: 22.5sec from 26 in table height 06/26/21: 16.9s from 24 inch table height    Time 12    Period Weeks    Status Partially Met    Target Date 08/26/21      PT LONG TERM GOAL #5   Title Patient will increase BLE gross strength to 4+/5 ( excluding ankle strength)  to improve functional strength for independent gait, increased standing tolerance and increased ADL ability.    Baseline 10/11: see note 2/10: not tested    Time 12    Period Weeks    Status On-going    Target Date 08/26/21                   Plan - 07/08/21 1432     Clinical Impression Statement Patient responded well to physical therapy interventions this date and continues to demonstrate excellent motivation for completion of physical therapy session.  Continue to progress lower extremity strengthening program completed in level gym primarily.  Patient did progress with sit to stand activities today and  demonstrated improved efficacy with sit to stands from lower height compared to previous progress note.  Patient plans to bring a shoulder sling in to his next physical therapy session to work on balance related tasks while wearing this and ambulating with a quad cane.  Patient will continue to benefit from skilled physical therapy intervention in order to improve his lower extremity strength, reduce fall risk, improve mobility, improve his overall quality of life.    Personal Factors and Comorbidities Age;Comorbidity 3+;Finances;Fitness;Past/Current Experience;Social Background;Time since onset of injury/illness/exacerbation;Transportation    Comorbidities leep apnea, HTN, aortic valve disease, GERD, genituourinary malignancy; renal, hydrocephalus s/p shunt placed 03/2020, DVT, Type II DM, obesity, OA, covid, depression, diabetic polyneuropathy, AD, pressure injury of right foot, and falls    Examination-Activity Limitations Bathing;Bed Mobility;Bend;Caring for Others;Carry;Dressing;Hygiene/Grooming;Stairs;Squat;Sit;Reach Overhead;Locomotion Level;Lift;Stand;Toileting;Transfers    Examination-Participation Restrictions Cleaning;Community Activity;Driving;Interpersonal Relationship;Laundry;Shop;Personal Finances;Occupation;Meal Prep;Volunteer;Yard Work    Merchant navy officer Evolving/Moderate complexity    Rehab Potential Fair    PT Frequency 2x / week    PT Duration 12 weeks    PT Treatment/Interventions ADLs/Self Care Home Management;Aquatic Therapy;Biofeedback;Electrical Stimulation;Traction;Functional mobility training;Stair training;Gait training;DME Instruction;Ultrasound;Moist Heat;Therapeutic activities;Therapeutic exercise;Neuromuscular re-education;Balance training;Manual techniques;Orthotic Fit/Training;Patient/family education;Compression bandaging;Passive range of motion;Dry needling;Vestibular;Taping;Vasopneumatic Device;Energy conservation;Manual lymph drainage;Canalith  Repostioning;Visual/perceptual remediation/compensation    PT Next Visit Plan balance with sling donned  PT Home Exercise Plan Handout: clamshells, LAQ, bridges or glute sets, sit to stands    Consulted and Agree with Plan of Care Patient             Patient will benefit from skilled therapeutic intervention in order to improve the following deficits and impairments:  Abnormal gait, Decreased activity tolerance, Decreased balance, Decreased knowledge of precautions, Decreased endurance, Decreased coordination, Decreased mobility, Decreased range of motion, Difficulty walking, Decreased strength, Hypomobility, Impaired flexibility, Impaired perceived functional ability, Impaired sensation, Postural dysfunction, Improper body mechanics, Cardiopulmonary status limiting activity, Impaired UE functional use, Obesity, Pain  Visit Diagnosis: Abnormality of gait and mobility  Difficulty in walking, not elsewhere classified  Unsteadiness on feet     Problem List Patient Active Problem List   Diagnosis Date Noted   Acute diastolic CHF (congestive heart failure) ([place]) 08/17/2019   Edema    Acute metabolic encephalopathy 54/62/7035   Acute pulmonary edema ([place]) 08/12/2019   Acute cor pulmonale (HCC) 08/11/2019   Oxygen desaturation 08/08/2019   S/P right rotator cuff repair 08/07/2019   Hypoxia 10/22/2018   Infection of right prepatellar bursa 10/18/2018   Sepsis ([place]) 10/18/2018   Chest congestion 07/20/2017   Viral illness 07/20/2017   Lower respiratory infection 07/12/2017   Laryngitis, acute 07/12/2017   Cough 07/12/2017   Pleurisy 07/12/2017   Depression 06/03/2015   HTN (hypertension) 01/02/2015   Hyperlipidemia 01/02/2015   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, [place], [address] Phone: [phone]   Fax:  [phone]  Name: Nathan Dean MRN: [id number] Date of Birth: 05-06-1956

## 2021-07-10 ENCOUNTER — Other Ambulatory Visit: Payer: Self-pay

## 2021-07-10 ENCOUNTER — Ambulatory Visit: Payer: Medicare Other | Admitting: Physical Therapy

## 2021-07-10 DIAGNOSIS — R2681 Unsteadiness on feet: Secondary | ICD-10-CM

## 2021-07-10 DIAGNOSIS — R269 Unspecified abnormalities of gait and mobility: Secondary | ICD-10-CM

## 2021-07-10 DIAGNOSIS — R262 Difficulty in walking, not elsewhere classified: Secondary | ICD-10-CM

## 2021-07-10 NOTE — Therapy (Signed)
[place] MAIN [facility] SERVICES [address] [place], [place], [address] Phone: [phone]   Fax:  [phone]  Physical Therapy Treatment  Patient Details  Name: Nathan Dean MRN: [id number] Date of Birth: October 27, 1955 Referring Provider (PT): Margart Sickles [place]   Encounter Date: 07/10/2021   PT End of Session - 07/10/21 1137     Visit Number 23    Number of Visits 40    Date for PT Re-Evaluation 08/26/21    Authorization Type 1/10 eval 02/24/21 Auth 06/03/21-08/26/21    Progress Note Due on Visit 30    PT Start Time 1000    PT Stop Time 1045    PT Time Calculation (min) 45 min    Equipment Utilized During Treatment Gait belt    Activity Tolerance Patient tolerated treatment well;Patient limited by fatigue    Behavior During Therapy WFL for tasks assessed/performed             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    Diabetes ([place])    GERD (gastroesophageal reflux disease)    Hyperlipidemia    Hypertension    Obesity    Sleep apnea     Past Surgical History:  Procedure Laterality Date   BACK SURGERY     in the 80's -herniated disc   BACK SURGERY     L4 bone spur 1990   CARPAL TUNNEL RELEASE Right 12/28/2016   Procedure: RIGHT ULNAR AND MEDIAN NEUROPLASTY AT WRIST;  Surgeon: Milly Jakob, MD;  Location: [facility];  Service: Orthopedics;  Laterality: Right;   CARPAL TUNNEL RELEASE Left    25 years ago   CATARACT EXTRACTION W/ INTRAOCULAR LENS IMPLANT Bilateral    REPLACEMENT TOTAL KNEE BILATERAL  2014   left 2012 rt 2014   SHOULDER OPEN ROTATOR CUFF REPAIR Right 08/07/2019   Procedure: ROTATOR CUFF REPAIR SHOULDER OPEN;  Surgeon: Thornton Park, MD;  Location: [facility];  Service: Orthopedics;  Laterality: Right;   TONSILLECTOMY      There were no vitals filed for this visit.   Subjective Assessment - 07/10/21 1136      Subjective Pt reports no falls or LOB since previous session. Reports he brought his sling for some practice with STS transitions.    Patient is accompained by: Family member    Pertinent History Patient has seen this therapist in 2019. Was being treated at [facility] clinic but is transferring to this clinic for balance training. PMH includes sleep apnea, HTN, aortic valve disease, GERD, genituourinary malignancy; renal, hydrocephalus s/p shunt placed 03/2020, DVT, Type II DM, obesity, OA, covid, depression, diabetic polyneuropathy, AD, pressure injury of right foot, and falls. Patient had R rotator cuff surgery a year and a half and has not had a good recovery process.  Patient has a history of falling prior to COVID, falls decreased after shunt but still are present. Will see a podiatrist soon for his pressure injury on R foot. Referral is for diabetic polyneuropathy, foot drop R foot, and falling. Patient reports >30 falls in past 6 months.    Limitations Sitting;Standing;Walking;House hold activities;Other (comment)    How long can you sit comfortably? a few hours    How long can you stand comfortably? immediately unstable    How long can you walk comfortably? immediately unstable    Patient Stated Goals improve leg strength. more independent    Pain Onset More than a  month ago                Exercise/Activity Sets/Reps/Time/ Management consultant type Comments              HS curl 15xL9 L 10 2 x 10  minA with positioning     therex   Setup assistance to accommodate his size  In wellzone  Increased fatigue this session likely due to fatigue from session 2 days prior   Knee extension machine 12 x L 5  10x L 6  10 x 5 sec hold at end range L2 minA with positioning     therex   Setup assistance to accommodate his size  In wellzone  Increased fatigue this session likely due to fatigue from session 2 days prior   Interval training on nustep  -LE only L2 for rest  L6 for  interval  8 min total 62mn:1 min  minA with positioning of LE      Therex       In wellzone, encourage to take full rest breaks following high intensity interval -monitor R LE to prevent excessive ER as if too much ER pr knee can skid against nustep handle -supervision level assist getting on/ off -increased resistance this session, rest breaks throughout due to fatigue in quad musculature    STS with UE sling donned on right   3 x 4 reps with different variations   There act   Used hands on supportive surface for one set with transition to quad cane, use of hand on quad cane only x 1 set, lowered table height and performed 2 with hands on QC and 2 with hands on supportive surface. Cues for sequencing as well as cues to sit back down in " safe zone" if he feel off balance. Also instructed in home setting to take his time and not take steps until have has his cane and has his balance.                                 Treatment Provided this session   Pt educated throughout session about proper posture and technique with exercises. Improved exercise technique, movement at target joints, use of target muscles after min to mod verbal, visual, tactile cues.  Note: Portions of this document were prepared using Dragon voice recognition software and although reviewed may contain unintentional dictation errors in syntax, grammar, or spelling.                         PT Education - 07/10/21 1136     Education Details safety with transitions with sling donned ( sequencing, taking time)    Person(s) Educated Patient    Methods Explanation;Demonstration    Comprehension Verbalized understanding;Returned demonstration;Need further instruction              PT Short Term Goals - 06/26/21 0956       PT SHORT TERM GOAL #1   Title Patient will be independent in home exercise program to improve strength/mobility for better functional independence with ADLs.    Baseline  10/11 HEP given, 06/03/21 updated HEP provided, 2/10 HEP going well.    Time 4    Period Weeks    Status Achieved    Target Date 07/29/21      PT SHORT TERM GOAL #2   Title Patient will report no falls in past  two weeks indicating improved stability and decreased fall risk.    Baseline 10/11; multiple falls 12/2: fall within this week (reports decrease in frequency) 1/18: fall within past week 2/10 no falls in several weeks, ambulating with cane around the home.    Time 4    Period Weeks    Status Achieved    Target Date 03/24/21               PT Long Term Goals - 06/26/21 1002       PT LONG TERM GOAL #1   Title Patient will increase FOTO score to equal to or greater than  58%   to demonstrate statistically significant improvement in mobility and quality of life.    Baseline 10/11: 41%, 04/17/21: 53 2/10: 53%    Time 12    Period Weeks    Status On-going    Target Date 08/26/21      PT LONG TERM GOAL #2   Title Patient will increase ABC scale score >60% to demonstrate better functional mobility and better confidence with ADLs.     Baseline 10/11: 32.5% 12/2:50.1% 06/03/21: 53.4%    Time 12    Period Weeks    Status On-going    Target Date 08/26/21      PT LONG TERM GOAL #3   Title Patient will increase Berg Balance score by > 6 points (21/56)  to demonstrate decreased fall risk during functional activities.    Baseline 10/11: 15/56 06/03/21: 26    Time 12    Period Weeks    Status On-going    Target Date 08/26/21      PT LONG TERM GOAL #4   Title Patient (> 68 years old) will complete five times sit to stand test in < 20 seconds from 24 inch surface indicating an increased LE strength and improved balance.    Baseline 10/11: unable to perform from standard height chair, 24 inches 24.52 06/03/21: 22.5sec from 26 in table height 06/26/21: 16.9s from 24 inch table height    Time 12    Period Weeks    Status Partially Met    Target Date 08/26/21      PT LONG TERM GOAL #5    Title Patient will increase BLE gross strength to 4+/5 ( excluding ankle strength)  to improve functional strength for independent gait, increased standing tolerance and increased ADL ability.    Baseline 10/11: see note 2/10: not tested    Time 12    Period Weeks    Status On-going    Target Date 08/26/21                   Plan - 07/10/21 1139     Clinical Impression Statement Patient responded well to physical therapy interventions this date and continues to demonstrate excellent motivation for completion of physical therapy session. Continue to progress lower extremity strengthening program completed in level gym primarily. Pt practiced with STS with UE sling donned on R UE in order to improve safety with transition following his surgery he is planning to have.  Patient will continue to benefit from skilled physical therapy intervention in order to improve his lower extremity strength, reduce fall risk, improve mobility, improve his overall quality of life.    Personal Factors and Comorbidities Age;Comorbidity 3+;Finances;Fitness;Past/Current Experience;Social Background;Time since onset of injury/illness/exacerbation;Transportation    Comorbidities leep apnea, HTN, aortic valve disease, GERD, genituourinary malignancy; renal, hydrocephalus s/p shunt placed 03/2020, DVT, Type II DM, obesity,  OA, covid, depression, diabetic polyneuropathy, AD, pressure injury of right foot, and falls    Examination-Activity Limitations Bathing;Bed Mobility;Bend;Caring for Others;Carry;Dressing;Hygiene/Grooming;Stairs;Squat;Sit;Reach Overhead;Locomotion Level;Lift;Stand;Toileting;Transfers    Examination-Participation Restrictions Cleaning;Community Activity;Driving;Interpersonal Relationship;Laundry;Shop;Personal Finances;Occupation;Meal Prep;Volunteer;Yard Work    [employer] Evolving/Moderate complexity    Rehab Potential Fair    PT Frequency 2x / week    PT Duration 12 weeks     PT Treatment/Interventions ADLs/Self Care Home Management;Aquatic Therapy;Biofeedback;Electrical Stimulation;Traction;Functional mobility training;Stair training;Gait training;DME Instruction;Ultrasound;Moist Heat;Therapeutic activities;Therapeutic exercise;Neuromuscular re-education;Balance training;Manual techniques;Orthotic Fit/Training;Patient/family education;Compression bandaging;Passive range of motion;Dry needling;Vestibular;Taping;Vasopneumatic Device;Energy conservation;Manual lymph drainage;Canalith Repostioning;Visual/perceptual remediation/compensation    PT Next Visit Plan balance with sling donned    PT Home Exercise Plan Handout: clamshells, LAQ, bridges or glute sets, sit to stands    Consulted and Agree with Plan of Care Patient             Patient will benefit from skilled therapeutic intervention in order to improve the following deficits and impairments:  Abnormal gait, Decreased activity tolerance, Decreased balance, Decreased knowledge of precautions, Decreased endurance, Decreased coordination, Decreased mobility, Decreased range of motion, Difficulty walking, Decreased strength, Hypomobility, Impaired flexibility, Impaired perceived functional ability, Impaired sensation, Postural dysfunction, Improper body mechanics, Cardiopulmonary status limiting activity, Impaired UE functional use, Obesity, Pain  Visit Diagnosis: Abnormality of gait and mobility  Difficulty in walking, not elsewhere classified  Unsteadiness on feet     Problem List Patient Active Problem List   Diagnosis Date Noted   Acute diastolic CHF (congestive heart failure) ([place]) 08/17/2019   Edema    Acute metabolic encephalopathy 91/47/8295   Acute pulmonary edema ([place]) 08/12/2019   Acute cor pulmonale ([facility]) 08/11/2019   Oxygen desaturation 08/08/2019   S/P right rotator cuff repair 08/07/2019   Hypoxia 10/22/2018   Infection of right prepatellar bursa 10/18/2018   Sepsis ([place]) 10/18/2018    Chest congestion 07/20/2017   Viral illness 07/20/2017   Lower respiratory infection 07/12/2017   Laryngitis, acute 07/12/2017   Cough 07/12/2017   Pleurisy 07/12/2017   Depression 06/03/2015   HTN (hypertension) 01/02/2015   Hyperlipidemia 01/02/2015   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

## 2021-07-14 NOTE — Progress Notes (Signed)
° °  Subjective:  66 y.o. male with PMHx of diabetes mellitus presenting today for follow-up evaluation regarding ulcers to the fifth toe area of the right foot.  Patient states that there is some improvement.  He continues to have some sensitivity to the toe however.  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  Dermatology:  Wounds noted to the right foot have healed.  There is some hyperkeratotic tissue to the plantar aspect of the fifth MTP joint right foot.  Skin is warm, dry and supple bilateral lower extremities.  Vascular: Palpable pedal pulses bilaterally. No edema or erythema noted. Capillary refill within normal limits.  Neurological: Epicritic and protective threshold diminished bilaterally.   Musculoskeletal Exam: No pedal deformities noted  Assessment: 1.  Ulcer right fifth MTP joint and fifth toe just proximal to the nail plate right secondary to diabetes mellitus; healed 2. diabetes mellitus w/ peripheral neuropathy 3.  Preulcerative callus lesion plantar fifth MTP right   Plan of Care:  1. Patient was evaluated. 2.  Light debridement of the hyperkeratotic callus tissue was performed using a 312 scalpel without incident or bleeding as a courtesy for the patient 3.  Continue gentamicin cream as needed 4.  Appointment with Pedorthist for new diabetic shoes and insoles 6 5.  Return to clinic annually  Felecia Shelling, DPM [facility]  Dr. Felecia Shelling, DPM    [address] [place], [place] [address]                Office [phone]  Fax [phone]

## 2021-07-15 ENCOUNTER — Ambulatory Visit: Payer: Medicare Other | Admitting: Physical Therapy

## 2021-07-17 ENCOUNTER — Encounter: Payer: Self-pay | Admitting: Physical Therapy

## 2021-07-17 ENCOUNTER — Other Ambulatory Visit: Payer: Self-pay

## 2021-07-17 ENCOUNTER — Ambulatory Visit: Payer: Medicare Other | Attending: Student | Admitting: Physical Therapy

## 2021-07-17 DIAGNOSIS — R278 Other lack of coordination: Secondary | ICD-10-CM | POA: Diagnosis present

## 2021-07-17 DIAGNOSIS — R269 Unspecified abnormalities of gait and mobility: Secondary | ICD-10-CM | POA: Insufficient documentation

## 2021-07-17 DIAGNOSIS — M6281 Muscle weakness (generalized): Secondary | ICD-10-CM | POA: Insufficient documentation

## 2021-07-17 DIAGNOSIS — R262 Difficulty in walking, not elsewhere classified: Secondary | ICD-10-CM | POA: Insufficient documentation

## 2021-07-17 DIAGNOSIS — R2681 Unsteadiness on feet: Secondary | ICD-10-CM | POA: Insufficient documentation

## 2021-07-17 DIAGNOSIS — R2689 Other abnormalities of gait and mobility: Secondary | ICD-10-CM | POA: Diagnosis present

## 2021-07-17 NOTE — Therapy (Signed)
[place] [facility] SERVICES [address] [place], [place], [address] Phone: [phone]   Fax:  [phone]  Physical Therapy Treatment  Patient Details  Name: Nathan Dean MRN: [id number] Date of Birth: [birth year] Referring Provider (PT): Margart Sickles [place]   Encounter Date: 07/17/2021   PT End of Session - 07/17/21 1141     Visit Number 24    Number of Visits 40    Date for PT Re-Evaluation 08/26/21    Authorization Type 1/10 eval 02/24/21 Auth 06/03/21-08/26/21    Progress Note Due on Visit 30    PT Start Time 1015    PT Stop Time 1045    PT Time Calculation (min) 30 min    Equipment Utilized During Treatment Gait belt    Activity Tolerance Patient tolerated treatment well;Patient limited by fatigue    Behavior During Therapy WFL for tasks assessed/performed             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    Diabetes ([place])    GERD (gastroesophageal reflux disease)    Hyperlipidemia    Hypertension    Obesity    Sleep apnea     Past Surgical History:  Procedure Laterality Date   BACK SURGERY     in the 80's -herniated disc   BACK SURGERY     L4 bone spur 1990   CARPAL TUNNEL RELEASE Right 12/28/2016   Procedure: RIGHT ULNAR AND MEDIAN NEUROPLASTY AT WRIST;  Surgeon: Milly Jakob, MD;  Location: [place];  Service: Orthopedics;  Laterality: Right;   CARPAL TUNNEL RELEASE Left    25 years ago   CATARACT EXTRACTION W/ INTRAOCULAR LENS IMPLANT Bilateral    REPLACEMENT TOTAL KNEE BILATERAL  2014   left 2012 rt 2014   SHOULDER OPEN ROTATOR CUFF REPAIR Right 08/07/2019   Procedure: ROTATOR CUFF REPAIR SHOULDER OPEN;  Surgeon: Thornton Park, MD;  Location: [facility];  Service: Orthopedics;  Laterality: Right;   TONSILLECTOMY      There were no vitals filed for this visit.   Subjective Assessment - 07/17/21 1139      Subjective Pt reports no falls or LOB since previous session. Reports continued complaints of shoulder pain in his right shoulder.    Patient is accompained by: Family member    Pertinent History Patient has seen this therapist in 2019. Was being treated at [facility] clinic but is transferring to this clinic for balance training. PMH includes sleep apnea, HTN, aortic valve disease, GERD, genituourinary malignancy; renal, hydrocephalus s/p shunt placed 03/2020, DVT, Type II DM, obesity, OA, covid, depression, diabetic polyneuropathy, AD, pressure injury of right foot, and falls. Patient had R rotator cuff surgery a year and a half and has not had a good recovery process.  Patient has a history of falling prior to COVID, falls decreased after shunt but still are present. Will see a podiatrist soon for his pressure injury on R foot. Referral is for diabetic polyneuropathy, foot drop R foot, and falling. Patient reports >30 falls in past 6 months.    Limitations Sitting;Standing;Walking;House hold activities;Other (comment)    How long can you sit comfortably? a few hours    How long can you stand comfortably? immediately unstable    How long can you walk comfortably? immediately unstable    Patient Stated Goals improve leg strength. more independent, less falls    Currently in Pain? Yes  Pain Score 7     Pain Location Shoulder    Pain Orientation Right    Pain Onset More than a month ago             Exercise/Activity Sets/Reps/Time/ Resistance Assistance Charge type Comments              HS curl 15xL9 12*L10 10*L11  minA with positioning     therex   Setup assistance to accommodate his size  In wellzone  Increased fatigue this session likely due to fatigue from session 2 days prior   Knee extension machine 15 x L 5  12x L 6  10 x 5 sec hold at end range L2 minA with positioning     therex   Setup assistance to accommodate his size  In wellzone  Increased fatigue this session  likely due to fatigue from session 2 days prior   Interval training on nustep  -LE only L2 for rest  L6 for interval  8 min total 87mn:1 min  minA with positioning of LE      Therex       In wellzone, encourage to take full rest breaks following high intensity interval -monitor R LE to prevent excessive ER as if too much ER pr knee can skid against nustep handle -supervision level assist getting on/ off -in patient required rest breaks throughout especially with higher level intervals due to fatigue and bilateral quadriceps musculature  PT performed instrument assisted soft tissue mobilization to bilateral quad musculature X3 minutes  Manual 2 improved delayed onset muscle soreness post session and improve blood flow to the target musculature.                                Treatment Provided this session   Pt educated throughout session about proper posture and technique with exercises. Improved exercise technique, movement at target joints, use of target muscles after min to mod verbal, visual, tactile cues.  Note: Portions of this document were prepared using Dragon voice recognition software and although reviewed may contain unintentional dictation errors in syntax, grammar, or spelling.  Pt session was cut a little short today due to pt arriving a few minutes late to scheduled appointment time                            PT Education - 07/17/21 1140     Education Details exercise form and technique    Person(s) Educated Patient    Methods Explanation;Demonstration    Comprehension Verbalized understanding              PT Short Term Goals - 06/26/21 0956       PT SHORT TERM GOAL #1   Title Patient will be independent in home exercise program to improve strength/mobility for better functional independence with ADLs.    Baseline 10/11 HEP given, 06/03/21 updated HEP provided, 2/10 HEP going well.    Time 4    Period Weeks    Status Achieved     Target Date 07/29/21      PT SHORT TERM GOAL #2   Title Patient will report no falls in past two weeks indicating improved stability and decreased fall risk.    Baseline 10/11; multiple falls 12/2: fall within this week (reports decrease in frequency) 1/18: fall within past week 2/10 no falls in several weeks, ambulating with  cane around the home.    Time 4    Period Weeks    Status Achieved    Target Date 03/24/21               PT Long Term Goals - 06/26/21 1002       PT LONG TERM GOAL #1   Title Patient will increase FOTO score to equal to or greater than  58%   to demonstrate statistically significant improvement in mobility and quality of life.    Baseline 10/11: 41%, 04/17/21: 53 2/10: 53%    Time 12    Period Weeks    Status On-going    Target Date 08/26/21      PT LONG TERM GOAL #2   Title Patient will increase ABC scale score >60% to demonstrate better functional mobility and better confidence with ADLs.     Baseline 10/11: 32.5% 12/2:50.1% 06/03/21: 53.4%    Time 12    Period Weeks    Status On-going    Target Date 08/26/21      PT LONG TERM GOAL #3   Title Patient will increase Berg Balance score by > 6 points (21/56)  to demonstrate decreased fall risk during functional activities.    Baseline 10/11: 15/56 06/03/21: 26    Time 12    Period Weeks    Status On-going    Target Date 08/26/21      PT LONG TERM GOAL #4   Title Patient (> 58 years old) will complete five times sit to stand test in < 20 seconds from 24 inch surface indicating an increased LE strength and improved balance.    Baseline 10/11: unable to perform from standard height chair, 24 inches 24.52 06/03/21: 22.5sec from 26 in table height 06/26/21: 16.9s from 24 inch table height    Time 12    Period Weeks    Status Partially Met    Target Date 08/26/21      PT LONG TERM GOAL #5   Title Patient will increase BLE gross strength to 4+/5 ( excluding ankle strength)  to improve functional  strength for independent gait, increased standing tolerance and increased ADL ability.    Baseline 10/11: see note 2/10: not tested    Time 12    Period Weeks    Status On-going    Target Date 08/26/21                   Plan - 07/17/21 1141     Clinical Impression Statement Patient responded well to physical therapy interventions this date continues to demonstrate excellent motivation for completion of physical therapy session.  Patient did have continued lower extremity fatigue throughout session but fatigue continues to improve this patient progresses with lower extremity resistance on exercises as well as repetitions with exercises.  Patient has had significant reduction in his falls with both lifestyle modifications as well as preventing situations that increase his fall risk.  Patient will continue to benefit from skilled physical therapy intervention in order to improve his lower extremity strength, reduce fall risk, improve mobility, improve his overall quality of life.    Personal Factors and Comorbidities Age;Comorbidity 3+;Finances;Fitness;Past/Current Experience;Social Background;Time since onset of injury/illness/exacerbation;Transportation    Comorbidities leep apnea, HTN, aortic valve disease, GERD, genituourinary malignancy; renal, hydrocephalus s/p shunt placed 03/2020, DVT, Type II DM, obesity, OA, covid, depression, diabetic polyneuropathy, AD, pressure injury of right foot, and falls    Examination-Activity Limitations Bathing;Bed Mobility;Bend;Caring for Others;Carry;Dressing;Hygiene/Grooming;Stairs;Squat;Sit;Reach Overhead;Locomotion Level;Lift;Stand;Toileting;Transfers  Examination-Participation Restrictions Cleaning;Community Activity;Driving;Interpersonal Relationship;Laundry;Shop;Personal Finances;Occupation;Meal Prep;Volunteer;Yard Work    [employer] Evolving/Moderate complexity    Rehab Potential Fair    PT Frequency 2x / week    PT  Duration 12 weeks    PT Treatment/Interventions ADLs/Self Care Home Management;Aquatic Therapy;Biofeedback;Electrical Stimulation;Traction;Functional mobility training;Stair training;Gait training;DME Instruction;Ultrasound;Moist Heat;Therapeutic activities;Therapeutic exercise;Neuromuscular re-education;Balance training;Manual techniques;Orthotic Fit/Training;Patient/family education;Compression bandaging;Passive range of motion;Dry needling;Vestibular;Taping;Vasopneumatic Device;Energy conservation;Manual lymph drainage;Canalith Repostioning;Visual/perceptual remediation/compensation    PT Next Visit Plan balance with sling donned    PT Home Exercise Plan Handout: clamshells, LAQ, bridges or glute sets, sit to stands    Consulted and Agree with Plan of Care Patient             Patient will benefit from skilled therapeutic intervention in order to improve the following deficits and impairments:  Abnormal gait, Decreased activity tolerance, Decreased balance, Decreased knowledge of precautions, Decreased endurance, Decreased coordination, Decreased mobility, Decreased range of motion, Difficulty walking, Decreased strength, Hypomobility, Impaired flexibility, Impaired perceived functional ability, Impaired sensation, Postural dysfunction, Improper body mechanics, Cardiopulmonary status limiting activity, Impaired UE functional use, Obesity, Pain  Visit Diagnosis: Abnormality of gait and mobility  Difficulty in walking, not elsewhere classified  Unsteadiness on feet  Other abnormalities of gait and mobility     Problem List Patient Active Problem List   Diagnosis Date Noted   Acute diastolic CHF (congestive heart failure) ([place]) 08/17/2019   Edema    Acute metabolic encephalopathy 67/04/4579   Acute pulmonary edema ([place]) 08/12/2019   Acute cor pulmonale (HCC) 08/11/2019   Oxygen desaturation 08/08/2019   S/P right rotator cuff repair 08/07/2019   Hypoxia 10/22/2018   Infection of  right prepatellar bursa 10/18/2018   Sepsis ([place]) 10/18/2018   Chest congestion 07/20/2017   Viral illness 07/20/2017   Lower respiratory infection 07/12/2017   Laryngitis, acute 07/12/2017   Cough 07/12/2017   Pleurisy 07/12/2017   Depression 06/03/2015   HTN (hypertension) 01/02/2015   Hyperlipidemia 01/02/2015   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

## 2021-07-22 ENCOUNTER — Ambulatory Visit: Payer: Medicare Other

## 2021-07-24 ENCOUNTER — Other Ambulatory Visit: Payer: Self-pay

## 2021-07-24 ENCOUNTER — Ambulatory Visit (INDEPENDENT_AMBULATORY_CARE_PROVIDER_SITE_OTHER): Payer: Medicare Other

## 2021-07-24 ENCOUNTER — Ambulatory Visit: Payer: Medicare Other | Admitting: Physical Therapy

## 2021-07-24 DIAGNOSIS — L989 Disorder of the skin and subcutaneous tissue, unspecified: Secondary | ICD-10-CM

## 2021-07-24 DIAGNOSIS — E1142 Type 2 diabetes mellitus with diabetic polyneuropathy: Secondary | ICD-10-CM

## 2021-07-24 DIAGNOSIS — L97512 Non-pressure chronic ulcer of other part of right foot with fat layer exposed: Secondary | ICD-10-CM

## 2021-07-24 NOTE — Progress Notes (Signed)
SITUATION ?Reason for Visit: Fitting of Diabetic [place] ?Patient / Caregiver Report:  Patient is satisfied with fit and function of shoes and insoles. ? ?OBJECTIVE DATA: ?Patient History / Diagnosis:   ?  ICD-10-CM   ?1. 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  ?2. Ulcer of right foot with fat layer exposed ([place])  L97.512   ?  ?3. Diabetic ulcer of toe of right foot associated with diabetes mellitus due to underlying condition, with fat layer exposed ([place])  [id number]   ? [id number]   ?  ? ? ?Change in Status:   None ? ?ACTIONS PERFORMED: ?In-Person Delivery, patient was fit with: ?- 1x pair [id number] PDAC approved prefabricated Diabetic Shoes: Apex [id number] 15W ?- 3x pair [id number] PDAC approved vacuum formed custom diabetic insoles; [place]: [id number] ? ?Shoes and insoles were verified for structural integrity and safety. Patient wore shoes and insoles in office. Skin was inspected and free of areas of concern after wearing shoes and inserts. Shoes and inserts fit properly. Patient / Caregiver provided with ferbal instruction and demonstration regarding donning, doffing, wear, care, proper fit, function, purpose, cleaning, and use of shoes and insoles ' and in all related precautions and risks and benefits regarding shoes and insoles. Patient / Caregiver was instructed to wear properly fitting socks with shoes at all times. Patient was also provided with verbal instruction regarding how to report any failures or malfunctions of shoes or inserts, and necessary follow up care. Patient / Caregiver was also instructed to contact physician regarding change in status that may affect function of shoes and inserts.  ? ?Patient / Caregiver verbalized undersatnding of instruction provided. Patient / Caregiver demonstrated independence with proper donning and doffing of shoes and inserts. ? ?PLAN ?Patient to follow up as needed. Plan of care was discussed with and agreed upon by patient  and/or caregiver. All questions were answered and concerns addressed. ? ?

## 2021-07-29 ENCOUNTER — Ambulatory Visit: Payer: Medicare Other

## 2021-07-29 ENCOUNTER — Other Ambulatory Visit: Payer: Self-pay

## 2021-07-29 DIAGNOSIS — M6281 Muscle weakness (generalized): Secondary | ICD-10-CM

## 2021-07-29 DIAGNOSIS — R278 Other lack of coordination: Secondary | ICD-10-CM

## 2021-07-29 DIAGNOSIS — R269 Unspecified abnormalities of gait and mobility: Secondary | ICD-10-CM | POA: Diagnosis not present

## 2021-07-29 DIAGNOSIS — R262 Difficulty in walking, not elsewhere classified: Secondary | ICD-10-CM

## 2021-07-29 DIAGNOSIS — R2689 Other abnormalities of gait and mobility: Secondary | ICD-10-CM

## 2021-07-29 DIAGNOSIS — R2681 Unsteadiness on feet: Secondary | ICD-10-CM

## 2021-07-29 NOTE — Therapy (Signed)
?[facility] ?[place], [place], [address] ?Phone: [phone]   Fax:  [phone] ? ?Physical Therapy Treatment ? ?Patient Details  ?Name: Nathan Dean ?MRN: [id number] ?Date of Birth: 06-09-1955 ?Referring Provider (PT): Margart Sickles PA ? ? ?Encounter Date: 07/29/2021 ? ? PT End of Session - 07/29/21 2120   ? ? Visit Number 25   ? Number of Visits 40   ? Date for PT Re-Evaluation 08/26/21   ? Authorization Type 1/10 eval 02/24/21 Auth 06/03/21-08/26/21   ? Progress Note Due on Visit 30   ? PT Start Time 1016   ? PT Stop Time 1059   ? PT Time Calculation (min) 43 min   ? Equipment Utilized During Treatment Gait belt   ? Activity Tolerance Patient tolerated treatment well;Patient limited by fatigue   ? Behavior During Therapy [facility] for tasks assessed/performed   ? ?  ?  ? ?  ? ? ?Past Medical History:  ?Diagnosis Date  ? Complication of anesthesia   ? 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 Diabetes ([place])   ? GERD (gastroesophageal reflux disease)   ? Hyperlipidemia   ? Hypertension   ? Obesity   ? Sleep apnea   ? ? ?Past Surgical History:  ?Procedure Laterality Date  ? BACK SURGERY    ? in the 80's -herniated disc  ? BACK SURGERY    ? L4 bone spur 1990  ? CARPAL TUNNEL RELEASE Right 12/28/2016  ? Procedure: RIGHT ULNAR AND MEDIAN NEUROPLASTY AT WRIST;  Surgeon: Milly Jakob, MD;  Location: [place];  Service: Orthopedics;  Laterality: Right;  ? CARPAL TUNNEL RELEASE Left   ? 25 years ago  ? CATARACT EXTRACTION W/ INTRAOCULAR LENS IMPLANT Bilateral   ? REPLACEMENT TOTAL KNEE BILATERAL  2014  ? left 2012 rt 2014  ? SHOULDER OPEN ROTATOR CUFF REPAIR Right 08/07/2019  ? Procedure: ROTATOR CUFF REPAIR SHOULDER OPEN;  Surgeon: Thornton Park, MD;  Location: [facility];  Service: Orthopedics;  Laterality: Right;  ? TONSILLECTOMY    ? ? ?There were no vitals filed for this visit. ? ? Subjective Assessment - 07/29/21 2123   ? ?  Subjective Patient reports ongoing right shoulder pain but continues to deny any falls. States he wants to continue to work on his LE strength to prepare him for a possible upcoming shoulder surgery.   ? Patient is accompained by: Family member   ? Pertinent History Patient has seen this therapist in 2019. Was being treated at [facility] clinic but is transferring to this clinic for balance training. PMH includes sleep apnea, HTN, aortic valve disease, GERD, genituourinary malignancy; renal, hydrocephalus s/p shunt placed 03/2020, DVT, Type II DM, obesity, OA, covid, depression, diabetic polyneuropathy, AD, pressure injury of right foot, and falls. Patient had R rotator cuff surgery a year and a half and has not had a good recovery process.  Patient has a history of falling prior to COVID, falls decreased after shunt but still are present. Will see a podiatrist soon for his pressure injury on R foot. Referral is for diabetic polyneuropathy, foot drop R foot, and falling. Patient reports >30 falls in past 6 months.   ? Limitations Sitting;Standing;Walking;House hold activities;Other (comment)   ? How long can you sit comfortably? a few hours   ? How long can you stand comfortably? immediately unstable   ? How long can you walk comfortably? immediately unstable   ? Patient Stated Goals improve  leg strength. more independent, less falls   ? Currently in Pain? Yes   ? Pain Score 7    ? Pain Location Shoulder   ? Pain Orientation Right;Anterior   ? Pain Descriptors / Indicators Aching;Sore   ? Pain Type Chronic pain   ? Pain Onset More than a month ago   ? ?  ?  ? ?  ? ?INTERVENTIONS:  ? ?Therapeutic Exercises: Exercises performed in [place] with close [place] to Supervision and assist with set up of all gym equipment.  ? ?Seated Hamstring curl-  ?1) Level 7 for 12 reps ?2) Level 8 for 10 reps  ?3) Level 9 for 8 reps ?4) level 10 for 6 reps ? ?Seated knee ext-  ?1) L5 for 12 reps  ?2) L7 for 10 reps  ?3) L8 for 8 reps ?4) L9  for 6 reps ?Patient reported fatigue but able to complete. ? ?Nustep HIIT training ? ?1) L1 = 1 min ?2) L5 = 1 min ?3) L1= 2 min ?4) L6= 1 min ?5) L1 = 1 min ?6) L7= 1 min ?7) L6 for 30 sec ?8) L4 for 30 sec ?9) L2 for 30 sec  ?10) L1 for 30 sec ? ?No difficulty keeping knee from excessive ER today.  ? ?Education provided throughout session via VC/TC and demonstration to facilitate movement at target joints and correct muscle activation for all testing and exercises performed.  ? ?Instructed patient in potential use of hemiwalker as a viable option for mobility for planning for upcoming Surgery. Patient ambulated around 20 feet with CGA and VC for gait sequencing. He will benefit from review if he decides to try to use at home.  ? ? ? ? ? ? ? ? ? ? ? ? ? ? ? ? ? ? ? ? ? ? ? ? ? PT Education - 07/29/21 2125   ? ? Education Details gym based exercise technique and how to use equipment safely.   ? Person(s) Educated Patient   ? Methods Explanation;Demonstration;Tactile cues;Verbal cues   ? Comprehension Verbalized understanding;Returned demonstration;Verbal cues required;Tactile cues required;Need further instruction   ? ?  ?  ? ?  ? ? ? PT Short Term Goals - 06/26/21 0956   ? ?  ? PT SHORT TERM GOAL #1  ? Title Patient will be independent in home exercise program to improve strength/mobility for better functional independence with ADLs.   ? Baseline 10/11 HEP given, 06/03/21 updated HEP provided, 2/10 HEP going well.   ? Time 4   ? Period Weeks   ? Status Achieved   ? Target Date 07/29/21   ?  ? PT SHORT TERM GOAL #2  ? Title Patient will report no falls in past two weeks indicating improved stability and decreased fall risk.   ? Baseline 10/11; multiple falls 12/2: fall within this week (reports decrease in frequency) 1/18: fall within past week 2/10 no falls in several weeks, ambulating with cane around the home.   ? Time 4   ? Period Weeks   ? Status Achieved   ? Target Date 03/24/21   ? ?  ?  ? ?  ? ? ? ? PT Long  Term Goals - 06/26/21 1002   ? ?  ? PT LONG TERM GOAL #1  ? Title Patient will increase FOTO score to equal to or greater than  58%   to demonstrate statistically significant improvement in mobility and quality of life.   ? Baseline  10/11: 41%, 04/17/21: 53 2/10: 53%   ? Time 12   ? Period Weeks   ? Status On-going   ? Target Date 08/26/21   ?  ? PT LONG TERM GOAL #2  ? Title Patient will increase ABC scale score >60% to demonstrate better functional mobility and better confidence with ADLs.    ? Baseline 10/11: 32.5% 12/2:50.1% 06/03/21: 53.4%   ? Time 12   ? Period Weeks   ? Status On-going   ? Target Date 08/26/21   ?  ? PT LONG TERM GOAL #3  ? Title Patient will increase Berg Balance score by > 6 points (21/56)  to demonstrate decreased fall risk during functional activities.   ? Baseline 10/11: 15/56 06/03/21: 26   ? Time 12   ? Period Weeks   ? Status On-going   ? Target Date 08/26/21   ?  ? PT LONG TERM GOAL #4  ? Title Patient (> 74 years old) will complete five times sit to stand test in < 20 seconds from 24 inch surface indicating an increased LE strength and improved balance.   ? Baseline 10/11: unable to perform from standard height chair, 24 inches 24.52 06/03/21: 22.5sec from 26 in table height 06/26/21: 16.9s from 24 inch table height   ? Time 12   ? Period Weeks   ? Status Partially Met   ? Target Date 08/26/21   ?  ? PT LONG TERM GOAL #5  ? Title Patient will increase BLE gross strength to 4+/5 ( excluding ankle strength)  to improve functional strength for independent gait, increased standing tolerance and increased ADL ability.   ? Baseline 10/11: see note 2/10: not tested   ? Time 12   ? Period Weeks   ? Status On-going   ? Target Date 08/26/21   ? ?  ?  ? ?  ? ? ? ? ? ? ? ? Plan - 07/29/21 2119   ? ? Clinical Impression Statement Patient presented with good motivation for today's strength focused session. He was able to perform pyramid style activities for power/muscle strength without difficulty  with positioning of equipment and no pain reported. He was able to complete interval training with only complaint as fatigue today and no significant difficulty with increasing resistance while lowering reps. Pa

## 2021-07-30 ENCOUNTER — Ambulatory Visit
Payer: Medicare Other | Attending: Student in an Organized Health Care Education/Training Program | Admitting: Student in an Organized Health Care Education/Training Program

## 2021-07-30 ENCOUNTER — Encounter: Payer: Self-pay | Admitting: Student in an Organized Health Care Education/Training Program

## 2021-07-30 ENCOUNTER — Other Ambulatory Visit: Payer: Self-pay

## 2021-07-30 VITALS — BP 144/76 | HR 83 | Temp 97.2°F | Resp 16 | Ht >= 80 in | Wt 350.0 lb

## 2021-07-30 DIAGNOSIS — M25511 Pain in right shoulder: Secondary | ICD-10-CM | POA: Diagnosis present

## 2021-07-30 DIAGNOSIS — M75101 Unspecified rotator cuff tear or rupture of right shoulder, not specified as traumatic: Secondary | ICD-10-CM | POA: Diagnosis present

## 2021-07-30 DIAGNOSIS — G894 Chronic pain syndrome: Secondary | ICD-10-CM

## 2021-07-30 DIAGNOSIS — Z9889 Other specified postprocedural states: Secondary | ICD-10-CM

## 2021-07-30 DIAGNOSIS — M47812 Spondylosis without myelopathy or radiculopathy, cervical region: Secondary | ICD-10-CM | POA: Diagnosis present

## 2021-07-30 DIAGNOSIS — G8929 Other chronic pain: Secondary | ICD-10-CM

## 2021-07-30 DIAGNOSIS — Z96653 Presence of artificial knee joint, bilateral: Secondary | ICD-10-CM

## 2021-07-30 DIAGNOSIS — M961 Postlaminectomy syndrome, not elsewhere classified: Secondary | ICD-10-CM

## 2021-07-30 DIAGNOSIS — M12811 Other specific arthropathies, not elsewhere classified, right shoulder: Secondary | ICD-10-CM | POA: Diagnosis present

## 2021-07-30 DIAGNOSIS — M25562 Pain in left knee: Secondary | ICD-10-CM | POA: Diagnosis present

## 2021-07-30 DIAGNOSIS — M25561 Pain in right knee: Secondary | ICD-10-CM | POA: Diagnosis present

## 2021-07-30 MED ORDER — TIZANIDINE HCL 4 MG PO TABS: 4.0000 mg | ORAL_TABLET | Freq: Two times a day (BID) | ORAL | 0 refills | Status: AC | PRN

## 2021-07-30 NOTE — Progress Notes (Signed)
Safety precautions to be maintained throughout the outpatient stay will include: orient to surroundings, keep bed in low position, maintain call bell within reach at all times, provide assistance with transfer out of bed and ambulation.  

## 2021-07-30 NOTE — Progress Notes (Signed)
Patient: Nathan Dean  Service Category: E/M  Provider: Gillis Santa, MD  ?DOB: March 06, 1956  DOS: 07/30/2021  Referring Provider: Luther Hearing,*  ?MRN: [id number]  Setting: Ambulatory outpatient  PCP: [facility]  ?Type: New Patient  Specialty: Interventional Pain Management    ?Location: Office  Delivery: Face-to-face    ? ?Primary Reason(s) for Visit: Encounter for initial evaluation of one or more chronic problems (new to examiner) potentially causing chronic pain, and posing a threat to normal musculoskeletal function. (Level of risk: High) ?CC: Shoulder Pain (Right s/p rotator cuff repair that failed and is d/t have shoulder replacement.  ) ? ?HPI  ?Nathan Dean is a 66 y.o. year old, male patient, who comes for the first time to our practice referred by Luther Hearing,* for our initial evaluation of his chronic pain. 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 ([place]); HTN (hypertension); Hyperlipidemia; Acid reflux; Arthritis, degenerative; AD (atopic dermatitis); Depression; Hereditary and idiopathic peripheral neuropathy; Sleep apnea; Lower respiratory infection; Laryngitis, acute; Cough; Pleurisy; Chest congestion; Viral illness; Infection of right prepatellar bursa; Sepsis ([place]); Hypoxia; S/P right rotator cuff repair; Oxygen desaturation; Acute cor pulmonale ([place]); Acute metabolic encephalopathy; Acute pulmonary edema ([place]); Edema; Acute diastolic CHF (congestive heart failure) ([place]); Chronic right shoulder pain; Cervical facet joint syndrome; Right rotator cuff tear arthropathy; Chronic pain syndrome; History of bilateral knee replacement; Bilateral chronic knee pain; and Post laminectomy syndrome on their problem list. Today he comes in for evaluation of his Shoulder Pain (Right s/p rotator cuff repair that failed and is d/t have shoulder replacement.  ) ? ?Pain Assessment: ?Location: Right Shoulder ?Radiating: right shoulder pain down the arm  and up into the neck ?Onset: More than a month ago ?Duration: Chronic pain ?Quality: Discomfort, Constant, Sharp ?Severity: 7 /10 (subjective, self-reported pain score)  ?Effect on ADL: limited activity with right arm. ?Timing: Intermittent ?Modifying factors: biofreeze ?BP: (!) 144/76  HR: 83 ? ?Onset and Duration: Present longer than 3 months ?Cause of pain: Surgery ?Severity: Getting worse, NAS-11 at its worse: 10/10, NAS-11 at its best: 5/10, NAS-11 now: 6/10, and NAS-11 on the average: 6/10 ?Timing: Night, During activity or exercise, and After activity or exercise ?Aggravating Factors: Lifiting, Motion, Surgery made it worse, and Twisting ?Alleviating Factors: Acupuncture, Cold packs, Hot packs, Lying down, Medications, and Warm showers or baths ?Associated Problems: Pain that wakes patient up and Pain that does not allow patient to sleep ?Quality of Pain: Annoying, Constant, Deep, Disabling, Distressing, Sharp, Shooting, and Uncomfortable ?Previous Examinations or Tests: CT scan, MRI scan, X-rays, Neurological evaluation, Neurosurgical evaluation, Orthopedic evaluation, and Chiropractic evaluation ?Previous Treatments: Narcotic medications, Physical Therapy, and TENS ? ?Marya Amsler is a pleasant 66 year old male who presents with a chief complaint of right shoulder pain status post right rotator cuff surgery approximately 1.5 years ago which unfortunately was not effective.  He states that the rotator cuff surgery actually worsened his pain and overall mobility.  He has been told that he needs a total shoulder replacement however he is reluctant to proceed with that given poor outcome with his rotator cuff surgery.  Of note he has tried physical therapy, various medications both opioid and nonopioid including but not limited to oxycodone, hydrocodone, tramadol, Robaxin, Flexeril, Soma.  He has also had shoulder steroid injections with limited response.  He is being referred here by his primary care provider Dr.  Tamala Julian to consider alternative options to help manage his pain. ? ?Patient also  has a history of bilateral knee replacements, lumbar laminectomy with associated lumbar spine pain.  He ambulates with a walker. ? ?He is currently on gabapentin 900 mg 3 times daily, Cymbalta 90 mg, meloxicam 15 mg daily as needed.  He is also a type II diabetic on insulin. ? ?Patient recently retired but states that he is getting bored not working and is looking for work again. ? ? ?Meds  ? ?Current Outpatient Medications:  ?  amLODipine (NORVASC) 10 MG tablet, TAKE 0.5 TABLETS (5 MG TOTAL) BY MOUTH DAILY. (Patient taking differently: Take 10 mg by mouth daily.), Disp: 45 tablet, Rfl: 3 ?  Blood Glucose Monitoring Suppl (ONE TOUCH ULTRA SYSTEM KIT) w/Device KIT, 1 kit by Does not apply route once. Dx. E11.9, Disp: 1 each, Rfl: 0 ?  buPROPion (WELLBUTRIN XL) 300 MG 24 hr tablet, Take 300 mg by mouth daily., Disp: , Rfl:  ?  carvedilol (COREG) 12.5 MG tablet, Take 1 tablet (12.5 mg total) by mouth 2 (two) times daily with a meal., Disp: , Rfl:  ?  doxycycline (VIBRA-TABS) 100 MG tablet, Take 1 tablet (100 mg total) by mouth 2 (two) times daily., Disp: 20 tablet, Rfl: 0 ?  DULoxetine (CYMBALTA) 30 MG capsule, Take 30 mg by mouth daily., Disp: , Rfl:  ?  DULoxetine (CYMBALTA) 60 MG capsule, Take 60 mg by mouth daily., Disp: , Rfl:  ?  gabapentin (NEURONTIN) 300 MG capsule, Take 3 capsules (900 mg total) by mouth 3 (three) times daily., Disp: , Rfl:  ?  glucose blood test strip, 1 each by Other route 4 (four) times daily. Use with One touch meter to check blood sugar. Dx E11.9, Disp: 100 each, Rfl: 12 ?  insulin aspart (NOVOLOG) 100 UNIT/ML FlexPen, Inject 100 Units into the skin as directed., Disp: , Rfl:  ?  Insulin Pen Needle (BD PEN NEEDLE NANO U/F) 32G X 4 MM MISC, Inject 1 each as directed 3 (three) times daily., Disp: 300 each, Rfl: 3 ?  loratadine (CLARITIN) 10 MG tablet, Take 10 mg by mouth daily as needed., Disp: , Rfl:  ?   losartan (COZAAR) 100 MG tablet, Take 1 tablet (100 mg total) by mouth daily., Disp: 30 tablet, Rfl: 0 ?  Melatonin 10 MG TABS, Take 20 mg by mouth at bedtime as needed (sleep)., Disp: , Rfl:  ?  meloxicam (MOBIC) 15 MG tablet, Take 15 mg by mouth daily., Disp: , Rfl:  ?  metFORMIN (GLUCOPHAGE) 500 MG tablet, Take 1,000 mg by mouth 2 (two) times daily with a meal., Disp: , Rfl:  ?  omeprazole (PRILOSEC) 20 MG capsule, Take 20 mg by mouth daily. , Disp: , Rfl:  ?  ONE TOUCH LANCETS MISC, 1 each by Does not apply route 4 (four) times daily. Use with one touch meter to check blood sugar. Dx: E11.9, Disp: 200 each, Rfl: 12 ?  rosuvastatin (CRESTOR) 20 MG tablet, Take 20 mg by mouth daily., Disp: , Rfl:  ?  senna-docusate (SENOKOT-S) 8.6-50 MG tablet, Take 2 tablets by mouth daily., Disp: , Rfl:  ?  tiZANidine (ZANAFLEX) 4 MG tablet, Take 1-1.5 tablets (4-6 mg total) by mouth 2 (two) times daily as needed for muscle spasms., Disp: 60 tablet, Rfl: 0 ? ?Imaging Review  ?Cervical Imaging: ?Cervical MR wo contrast: Results for orders placed during the hospital encounter of 02/10/19 ? ?MR CERVICAL SPINE WO CONTRAST ? ?Narrative ?CLINICAL DATA:  Initial evaluation for chronic left-sided neck and ?shoulder pain, weakness and  numbness in arms. ? ?EXAM: ?MRI CERVICAL SPINE WITHOUT CONTRAST ? ?TECHNIQUE: ?Multiplanar, multisequence MR imaging of the cervical spine was ?performed. No intravenous contrast was administered. ? ?COMPARISON:  None. ? ?FINDINGS: ?Alignment: Mild straightening of the normal cervical lordosis. Trace ?retrolisthesis of C5 on C6, chronic and facet mediated. ? ?Vertebrae: Vertebral body height maintained without evidence for ?acute or chronic fracture. Bone marrow signal intensity normal. No ?discrete or worrisome osseous lesions. No abnormal marrow edema. ? ?Cord: Signal intensity within the cervical spinal cord is normal. ? 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 ?Craniocervical junction normal. Paraspinous and prevertebral soft ?tissues within normal limits. Normal intravascular flow voids seen ?within the vertebral a

## 2021-07-31 ENCOUNTER — Ambulatory Visit: Payer: Medicare Other | Admitting: Physical Therapy

## 2021-08-03 ENCOUNTER — Encounter: Payer: Self-pay | Admitting: Student in an Organized Health Care Education/Training Program

## 2021-08-05 ENCOUNTER — Ambulatory Visit: Payer: Medicare Other

## 2021-08-07 ENCOUNTER — Ambulatory Visit: Payer: Medicare Other | Admitting: Physical Therapy

## 2021-08-10 ENCOUNTER — Ambulatory Visit: Payer: Medicare Other | Admitting: Student in an Organized Health Care Education/Training Program

## 2021-08-10 ENCOUNTER — Other Ambulatory Visit: Payer: Self-pay

## 2021-08-10 ENCOUNTER — Ambulatory Visit
Admission: RE | Admit: 2021-08-10 | Discharge: 2021-08-10 | Disposition: A | Discharge status: Home / Self Care | Payer: Medicare Other | Source: Ambulatory Visit | Attending: Student in an Organized Health Care Education/Training Program | Admitting: Student in an Organized Health Care Education/Training Program

## 2021-08-10 ENCOUNTER — Encounter: Payer: Self-pay | Admitting: Student in an Organized Health Care Education/Training Program

## 2021-08-10 ENCOUNTER — Ambulatory Visit (HOSPITAL_BASED_OUTPATIENT_CLINIC_OR_DEPARTMENT_OTHER): Payer: Medicare Other | Admitting: Student in an Organized Health Care Education/Training Program

## 2021-08-10 VITALS — BP 138/83 | HR 78 | Temp 97.6°F | Resp 18 | Ht >= 80 in | Wt 365.0 lb

## 2021-08-10 DIAGNOSIS — Z9889 Other specified postprocedural states: Secondary | ICD-10-CM | POA: Insufficient documentation

## 2021-08-10 DIAGNOSIS — M12811 Other specific arthropathies, not elsewhere classified, right shoulder: Secondary | ICD-10-CM

## 2021-08-10 DIAGNOSIS — M25511 Pain in right shoulder: Secondary | ICD-10-CM

## 2021-08-10 DIAGNOSIS — G8929 Other chronic pain: Secondary | ICD-10-CM | POA: Insufficient documentation

## 2021-08-10 DIAGNOSIS — M75101 Unspecified rotator cuff tear or rupture of right shoulder, not specified as traumatic: Secondary | ICD-10-CM | POA: Insufficient documentation

## 2021-08-10 MED ORDER — IOHEXOL 180 MG/ML  SOLN
10.0000 mL | Freq: Once | INTRAMUSCULAR | Status: AC
  Administered 2021-08-10: 10 mL via EPIDURAL
  Filled 2021-08-10: qty 20

## 2021-08-10 MED ORDER — DEXAMETHASONE SODIUM PHOSPHATE 10 MG/ML IJ SOLN
INTRAMUSCULAR | Status: AC
  Filled 2021-08-10: qty 1

## 2021-08-10 MED ORDER — ROPIVACAINE HCL 2 MG/ML IJ SOLN
INTRAMUSCULAR | Status: AC
  Filled 2021-08-10: qty 20

## 2021-08-10 MED ORDER — DEXAMETHASONE SODIUM PHOSPHATE 10 MG/ML IJ SOLN
10.0000 mg | Freq: Once | INTRAMUSCULAR | Status: AC
  Administered 2021-08-10: 10 mg

## 2021-08-10 MED ORDER — ROPIVACAINE HCL 2 MG/ML IJ SOLN
4.0000 mL | Freq: Once | INTRAMUSCULAR | Status: AC
  Administered 2021-08-10: 4 mL via PERINEURAL

## 2021-08-10 MED ORDER — LIDOCAINE HCL 2 % IJ SOLN
20.0000 mL | Freq: Once | INTRAMUSCULAR | Status: AC
  Administered 2021-08-10: 400 mg

## 2021-08-10 MED ORDER — LIDOCAINE HCL 2 % IJ SOLN
INTRAMUSCULAR | Status: AC
  Filled 2021-08-10: qty 20

## 2021-08-10 NOTE — Progress Notes (Signed)
Safety precautions to be maintained throughout the outpatient stay will include: orient to surroundings, keep bed in low position, maintain call bell within reach at all times, provide assistance with transfer out of bed and ambulation.  

## 2021-08-10 NOTE — Progress Notes (Signed)
PROVIDER NOTE: Interpretation of information contained herein should be left to medically-trained personnel. Specific patient instructions are provided elsewhere under "Patient Instructions" section of medical record. This document was created in part using STT-dictation technology, any transcriptional errors that may result from this process are unintentional.  ?Patient: Nathan Dean ?Type: Established ?DOB: [birth year] ?MRN: [id number] ?PCP: [facility]  Service: Procedure ?DOS: 08/10/2021 ?Setting: Ambulatory ?Location: Ambulatory outpatient facility ?Delivery: Face-to-face Provider: Gillis Santa, MD ?Specialty: Interventional Pain Management ?Specialty designation: 09 ?Location: Outpatient facility ?Ref. Prov.: [place], [place]   ? ?Primary Reason for Visit: Interventional Pain Management Treatment. ?CC: Shoulder Pain (right) ? ?Procedure:          ? Type: Suprascapular nerve block #1  ?Laterality:  Right ?Level: Superior to scapular spine, lateral to supraspinatus fossa (Suprascapular notch).  ?Imaging: Fluoroscopic guidance ?Anesthesia: Local anesthesia (1-2% Lidocaine) ?Anxiolysis: None                 ?Sedation: None. ?DOS: 08/10/2021  ?Performed by: Gillis Santa, MD ? ?Purpose: Diagnostic/Therapeutic ?Indications: Shoulder pain, severe enough to impact quality of life and/or function. ?1. Chronic right shoulder pain   ?2. S/P right rotator cuff repair   ?3. Right rotator cuff tear arthropathy   ? ?NAS-11 score:  ? Pre-procedure: 8 /10  ? Post-procedure: 0-No pain/10  ? ?  ?Target: Suprascapular nerve ?Location: midway between the medial border of the scapula and the acromion as it runs through the suprascapular notch. ?Region: Suprascapular, posterior shoulder  ?Approach: Percutaneous  ?Neuroanatomy: The suprascapular nerve is the lateral branch of the superior trunk of the brachial plexus. It receives nerve fibers that originate in the nerve roots C5 and C6 (and sometimes C4). It  is a mixed nerve, meaning that it provides both sensory and motor supply for the suprascapular region. ?Function: The main function of this nerve is to provide motor innervation for two muscles, the supraspinatus and infraspinatus muscles. They are part of the rotator cuff muscles. In addition, the suprascapular nerve provides a sensory supply to the joints of the scapula (glenohumeral and acromioclavicular joints). ?Rationale (medical necessity): procedure needed and proper for the diagnosis and/or treatment of the patient's medical symptoms and needs. ? ?Position / Prep / Materials:  ?Position: Prone ?Materials:  ?Tray: Block ?Needle(s):  ?Type: Spinal  ?Gauge (G): 22  ?Length: 3.5 in.  ?Qty: 1 ?Prep solution: DuraPrep (Iodine Povacrylex [0.7% available iodine] and Isopropyl Alcohol, 74% w/w) ?Prep Area: Entire posterior shoulder area. From upper spine to shoulder proper (upper arm), and from lateral neck to lower tip of shoulder blade.  ? ?Pre-op H&P Assessment:  ?Nathan Dean is a 66 y.o. (year old), male patient, seen today for interventional treatment. He  has a past surgical history that includes Replacement total knee bilateral (2014); Carpal tunnel release (Right, 12/28/2016); Carpal tunnel release (Left); Tonsillectomy; Cataract extraction w/ intraocular lens implant (Bilateral); Back surgery; Back surgery; and Shoulder open rotator cuff repair (Right, 08/07/2019). Mr. Lappe has a current medication list which includes the following prescription(s): amlodipine, one touch ultra system kit, bupropion, carvedilol, doxycycline, duloxetine, duloxetine, gabapentin, glucose blood, insulin aspart, insulin pen needle, loratadine, losartan, melatonin, meloxicam, metformin, omeprazole, one touch lancets, rosuvastatin, senna-docusate, and tizanidine, and the following Facility-Administered Medications: dexamethasone, iohexol, lidocaine, and ropivacaine (pf) 2 mg/ml (0.2%). His primarily concern today is the Shoulder  Pain (right) ? ?Initial Vital Signs:  ?Pulse/HCG Rate: 78ECG Heart Rate: 78 ?Temp: 97.6 ?F (36.4 ?C) ?Resp: 16 ?BP: (!) 146/67 ?SpO2: 98 % ? ?  BMI: Estimated body mass index is 39.11 kg/m? as calculated from the following: ?  Height as of this encounter: 6' 9" (2.057 m). ?  Weight as of this encounter: 365 lb (165.6 kg). ? ?Risk Assessment: ?Allergies: Reviewed. He is allergic to penicillins, succinylcholine chloride, keflex [cephalexin], and sulfa antibiotics.  ?Allergy Precautions: None required ?Coagulopathies: Reviewed. None identified.  ?Blood-thinner therapy: None at this time ?Active Infection(s): Reviewed. None identified. Nathan Dean is afebrile ? ?Site Confirmation: Mr. Seckman was asked to confirm the procedure and laterality before marking the site ?Procedure checklist: Completed ?Consent: Before the procedure and under the influence of no sedative(s), amnesic(s), or anxiolytics, the patient was informed of the treatment options, risks and possible complications. To fulfill our ethical and legal obligations, as recommended by the American Medical Association's Code of Ethics, I have informed the patient of my clinical impression; the nature and purpose of the treatment or procedure; the risks, benefits, and possible complications of the intervention; the alternatives, including doing nothing; the risk(s) and benefit(s) of the alternative treatment(s) or procedure(s); and the risk(s) and benefit(s) of doing nothing. ?The patient was provided information about the general risks and possible complications associated with the procedure. These may include, but are not limited to: failure to achieve desired goals, infection, bleeding, organ or nerve damage, allergic reactions, paralysis, and death. ?In addition, the patient was informed of those risks and complications associated to the procedure, such as failure to decrease pain; infection; bleeding; organ or nerve damage with subsequent damage to sensory,  motor, and/or autonomic systems, resulting in permanent pain, numbness, and/or weakness of one or several areas of the body; allergic reactions; (i.e.: anaphylactic reaction); and/or death. ?Furthermore, the patient was informed of those risks and complications associated with the medications. These include, but are not limited to: allergic reactions (i.e.: anaphylactic or anaphylactoid reaction(s)); adrenal axis suppression; blood sugar elevation that in diabetics may result in ketoacidosis or comma; water retention that in patients with history of congestive heart failure may result in shortness of breath, pulmonary edema, and decompensation with resultant heart failure; weight gain; swelling or edema; medication-induced neural toxicity; particulate matter embolism and blood vessel occlusion with resultant organ, and/or nervous system infarction; and/or aseptic necrosis of one or more joints. ?Finally, the patient was informed that Medicine is not an exact science; therefore, there is also the possibility of unforeseen or unpredictable risks and/or possible complications that may result in a catastrophic outcome. The patient indicated having understood very clearly. We have given the patient no guarantees and we have made no promises. Enough time was given to the patient to ask questions, all of which were answered to the patient's satisfaction. Mr. Thackston has indicated that he wanted to continue with the procedure. ?Attestation: I, the ordering provider, attest that I have discussed with the patient the benefits, risks, side-effects, alternatives, likelihood of achieving goals, and potential problems during recovery for the procedure that I have provided informed consent. ?Date  Time: 08/10/2021 10:09 AM ? ?Pre-Procedure Preparation:  ?Monitoring: As per clinic protocol. Respiration, ETCO2, SpO2, BP, heart rate and rhythm monitor placed and checked for adequate function ?Safety Precautions: Patient was assessed  for positional comfort and pressure points before starting the procedure. ?Time-out: I initiated and conducted the "Time-out" before starting the procedure, as per protocol. The patient was asked to partici

## 2021-08-10 NOTE — Patient Instructions (Signed)

## 2021-08-11 ENCOUNTER — Telehealth: Payer: Self-pay | Admitting: *Deleted

## 2021-08-11 NOTE — Telephone Encounter (Signed)
Spoke with patient's wife, she reports he is having no problems post procedure. ?

## 2021-08-12 ENCOUNTER — Other Ambulatory Visit: Payer: Self-pay

## 2021-08-12 ENCOUNTER — Ambulatory Visit: Payer: Medicare Other | Admitting: Physical Therapy

## 2021-08-12 ENCOUNTER — Encounter: Payer: Self-pay | Admitting: Physical Therapy

## 2021-08-12 ENCOUNTER — Ambulatory Visit: Payer: Medicare Other | Admitting: Student in an Organized Health Care Education/Training Program

## 2021-08-12 DIAGNOSIS — R2681 Unsteadiness on feet: Secondary | ICD-10-CM

## 2021-08-12 DIAGNOSIS — R262 Difficulty in walking, not elsewhere classified: Secondary | ICD-10-CM

## 2021-08-12 DIAGNOSIS — R269 Unspecified abnormalities of gait and mobility: Secondary | ICD-10-CM | POA: Diagnosis not present

## 2021-08-12 DIAGNOSIS — R2689 Other abnormalities of gait and mobility: Secondary | ICD-10-CM

## 2021-08-12 NOTE — Therapy (Signed)
[facility] ?[place], [place], [address] ?Phone: [phone]   Fax:  [phone] ? ?Physical Therapy Treatment ? ?Patient Details  ?Name: Nathan Dean ?MRN: [id number] ?Date of Birth: 09-Feb-1956 ?Referring Provider (PT): Margart Sickles PA ? ? ?Encounter Date: 08/12/2021 ? ? PT End of Session - 08/12/21 1115   ? ? Visit Number 26   ? Number of Visits 40   ? Date for PT Re-Evaluation 08/26/21   ? Authorization Type 1/10 eval 02/24/21 Auth 06/03/21-08/26/21   ? Progress Note Due on Visit 30   ? PT Start Time 1025   ? PT Stop Time 1059   ? PT Time Calculation (min) 34 min   ? Equipment Utilized During Treatment Gait belt   ? Activity Tolerance Patient tolerated treatment well;Patient limited by fatigue   ? Behavior During Therapy [facility] for tasks assessed/performed   ? ?  ?  ? ?  ? ? ?Past Medical History:  ?Diagnosis Date  ? Complication of anesthesia   ? 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 Diabetes ([place])   ? GERD (gastroesophageal reflux disease)   ? Hyperlipidemia   ? Hypertension   ? Obesity   ? Sleep apnea   ? ? ?Past Surgical History:  ?Procedure Laterality Date  ? BACK SURGERY    ? in the 80's -herniated disc  ? BACK SURGERY    ? L4 bone spur 1990  ? CARPAL TUNNEL RELEASE Right 12/28/2016  ? Procedure: RIGHT ULNAR AND MEDIAN NEUROPLASTY AT WRIST;  Surgeon: Milly Jakob, MD;  Location: [facility];  Service: Orthopedics;  Laterality: Right;  ? CARPAL TUNNEL RELEASE Left   ? 25 years ago  ? CATARACT EXTRACTION W/ INTRAOCULAR LENS IMPLANT Bilateral   ? REPLACEMENT TOTAL KNEE BILATERAL  2014  ? left 2012 rt 2014  ? SHOULDER OPEN ROTATOR CUFF REPAIR Right 08/07/2019  ? Procedure: ROTATOR CUFF REPAIR SHOULDER OPEN;  Surgeon: Thornton Park, MD;  Location: [facility];  Service: Orthopedics;  Laterality: Right;  ? TONSILLECTOMY    ? ? ?There were no vitals filed for this visit. ? ? Subjective Assessment - 08/12/21 1108   ? ?  Subjective Pt reports one fall when bending over to get into his freezer earlier this week. Reports he was able to get up with min assistance from his neightbors for Le support. No injuries sustained. Pt also had shoulder injection that has alleviated osme of his pain but pain and discomfort still present.   ? Patient is accompained by: Family member   ? Pertinent History Patient has seen this therapist in 2019. Was being treated at [facility] clinic but is transferring to this clinic for balance training. PMH includes sleep apnea, HTN, aortic valve disease, GERD, genituourinary malignancy; renal, hydrocephalus s/p shunt placed 03/2020, DVT, Type II DM, obesity, OA, covid, depression, diabetic polyneuropathy, AD, pressure injury of right foot, and falls. Patient had R rotator cuff surgery a year and a half and has not had a good recovery process.  Patient has a history of falling prior to COVID, falls decreased after shunt but still are present. Will see a podiatrist soon for his pressure injury on R foot. Referral is for diabetic polyneuropathy, foot drop R foot, and falling. Patient reports >30 falls in past 6 months.   ? Limitations Sitting;Standing;Walking;House hold activities;Other (comment)   ? How long can you sit comfortably? a few hours   ? How long can you stand comfortably?  immediately unstable   ? How long can you walk comfortably? immediately unstable   ? Patient Stated Goals improve leg strength. more independent, less falls   ? Currently in Pain? Yes   ? Pain Score 4    ? Pain Location Shoulder   ? Pain Orientation Right   ? Pain Descriptors / Indicators Aching;Discomfort   ? Pain Type Chronic pain   ? Pain Onset More than a month ago   ? Pain Frequency Intermittent   ? Pain Onset In the past 7 days   ? ?  ?  ? ?  ? ? ?Exercise/Activity Sets/Reps/Time/ Resistance Assistance Charge type Comments  ?      ?      ?HS curl 15xL9 ?12*L10 ?10*L11  minA with positioning ? ?  ? Therex  ? Setup assistance to  accommodate his size  ?In wellzone  ?Increased fatigue this session likely due to fatigue from session 2 days prior   ?Knee extension machine 12 x L 7 ?10 x L 8  ?3 x Level 9 * ?10 x 5 sec hold at end range L2 minA with positioning ? ?  ? Therex  ? Setup assistance to accommodate his size  ?In wellzone  ?Increased fatigue this session likely due to fatigue from session 2 days prior  ?*Unable to complete full set of 8 reps due to LE fatigue/ weakness   ?Interval training on nustep  ?-LE only L2 for rest  ?L6 for interval  ?8 min total ?83mn:1 min  minA with positioning of LE  ? ?  ? Therex ? ? ? ?  ? In wellzone, encourage to take full rest breaks following high intensity interval ?-monitor R LE to prevent excessive ER as if too much ER pr knee can skid against nustep handle ?-supervision level assist getting on/ off ?-in patient required rest breaks throughout especially with higher level intervals due to fatigue and bilateral quadriceps musculature  ?      ?      ?      ?      ?      ?      ?Treatment Provided this session  ? ?Pt educated throughout session about proper posture and technique with exercises. Improved exercise technique, movement at target joints, use of target muscles after min to mod verbal, visual, tactile cues. ? ?Note: Portions of this document were prepared using Dragon voice recognition software and although reviewed may contain unintentional dictation errors in syntax, grammar, or spelling. ? ?Pt  " arrival time" not accurate/ reflective of treatment time this session secondary to front desk staff error  ? ? ? ? ? ? ? ? ? ? ? ? ? ? ? ? ? ? ? ? ? ? ? ? ? ? ? ? ? ? ? ? ? ? PT Education - 08/12/21 1115   ? ? Education Details exercise technique and safety with gym equipment   ? Person(s) Educated Patient   ? Methods Explanation;Demonstration;Tactile cues;Verbal cues   ? Comprehension Verbalized understanding;Returned demonstration;Verbal cues required   ? ?  ?  ? ?  ? ? ? PT Short Term Goals -  06/26/21 0956   ? ?  ? PT SHORT TERM GOAL #1  ? Title Patient will be independent in home exercise program to improve strength/mobility for better functional independence with ADLs.   ? Baseline 10/11 HEP given, 06/03/21 updated HEP provided, 2/10 HEP going well.   ? Time  4   ? Period Weeks   ? Status Achieved   ? Target Date 07/29/21   ?  ? PT SHORT TERM GOAL #2  ? Title Patient will report no falls in past two weeks indicating improved stability and decreased fall risk.   ? Baseline 10/11; multiple falls 12/2: fall within this week (reports decrease in frequency) 1/18: fall within past week 2/10 no falls in several weeks, ambulating with cane around the home.   ? Time 4   ? Period Weeks   ? Status Achieved   ? Target Date 03/24/21   ? ?  ?  ? ?  ? ? ? ? PT Long Term Goals - 06/26/21 1002   ? ?  ? PT LONG TERM GOAL #1  ? Title Patient will increase FOTO score to equal to or greater than  58%   to demonstrate statistically significant improvement in mobility and quality of life.   ? Baseline 10/11: 41%, 04/17/21: 53 2/10: 53%   ? Time 12   ? Period Weeks   ? Status On-going   ? Target Date 08/26/21   ?  ? PT LONG TERM GOAL #2  ? Title Patient will increase ABC scale score >60% to demonstrate better functional mobility and better confidence with ADLs.    ? Baseline 10/11: 32.5% 12/2:50.1% 06/03/21: 53.4%   ? Time 12   ? Period Weeks   ? Status On-going   ? Target Date 08/26/21   ?  ? PT LONG TERM GOAL #3  ? Title Patient will increase Berg Balance score by > 6 points (21/56)  to demonstrate decreased fall risk during functional activities.   ? Baseline 10/11: 15/56 06/03/21: 26   ? Time 12   ? Period Weeks   ? Status On-going   ? Target Date 08/26/21   ?  ? PT LONG TERM GOAL #4  ? Title Patient (> 73 years old) will complete five times sit to stand test in < 20 seconds from 24 inch surface indicating an increased LE strength and improved balance.   ? Baseline 10/11: unable to perform from standard height chair, 24 inches  24.52 06/03/21: 22.5sec from 26 in table height 06/26/21: 16.9s from 24 inch table height   ? Time 12   ? Period Weeks   ? Status Partially Met   ? Target Date 08/26/21   ?  ? PT LONG TERM GOAL #5  ? Title [place]

## 2021-08-14 ENCOUNTER — Ambulatory Visit: Payer: Medicare Other | Admitting: Physical Therapy

## 2021-08-19 ENCOUNTER — Ambulatory Visit: Payer: Medicare Other | Attending: Student

## 2021-08-19 DIAGNOSIS — R269 Unspecified abnormalities of gait and mobility: Secondary | ICD-10-CM | POA: Diagnosis present

## 2021-08-19 DIAGNOSIS — R262 Difficulty in walking, not elsewhere classified: Secondary | ICD-10-CM | POA: Diagnosis present

## 2021-08-19 DIAGNOSIS — R2681 Unsteadiness on feet: Secondary | ICD-10-CM | POA: Diagnosis present

## 2021-08-19 DIAGNOSIS — R296 Repeated falls: Secondary | ICD-10-CM | POA: Diagnosis present

## 2021-08-19 DIAGNOSIS — M6281 Muscle weakness (generalized): Secondary | ICD-10-CM | POA: Insufficient documentation

## 2021-08-19 DIAGNOSIS — R2689 Other abnormalities of gait and mobility: Secondary | ICD-10-CM | POA: Insufficient documentation

## 2021-08-19 DIAGNOSIS — R278 Other lack of coordination: Secondary | ICD-10-CM | POA: Diagnosis present

## 2021-08-19 NOTE — Therapy (Signed)
[facility] ?[place], [place], [address] ?Phone: [phone]   Fax:  [phone] ? ?Physical Therapy Treatment ? ?Patient Details  ?Name: Nathan Dean ?MRN: [id number] ?Date of Birth: [birth year] ?Referring Provider (PT): Margart Sickles PA ? ? ?Encounter Date: 08/19/2021 ? ? PT End of Session - 08/19/21 1537   ? ? Visit Number 27   ? Number of Visits 40   ? Date for PT Re-Evaluation 08/26/21   ? Authorization Type 1/10 eval 02/24/21 Auth 06/03/21-08/26/21   ? Progress Note Due on Visit 30   ? PT Start Time 1016   ? PT Stop Time 1054   ? PT Time Calculation (min) 38 min   ? Equipment Utilized During Treatment Gait belt   ? Activity Tolerance Patient tolerated treatment well;Patient limited by fatigue   ? Behavior During Therapy [facility] for tasks assessed/performed   ? ?  ?  ? ?  ? ? ?Past Medical History:  ?Diagnosis Date  ? Complication of anesthesia   ? 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 Diabetes ([place])   ? GERD (gastroesophageal reflux disease)   ? Hyperlipidemia   ? Hypertension   ? Obesity   ? Sleep apnea   ? ? ?Past Surgical History:  ?Procedure Laterality Date  ? BACK SURGERY    ? in the 80's -herniated disc  ? BACK SURGERY    ? L4 bone spur 1990  ? CARPAL TUNNEL RELEASE Right 12/28/2016  ? Procedure: RIGHT ULNAR AND MEDIAN NEUROPLASTY AT WRIST;  Surgeon: Milly Jakob, MD;  Location: [facility];  Service: Orthopedics;  Laterality: Right;  ? CARPAL TUNNEL RELEASE Left   ? 25 years ago  ? CATARACT EXTRACTION W/ INTRAOCULAR LENS IMPLANT Bilateral   ? REPLACEMENT TOTAL KNEE BILATERAL  2014  ? left 2012 rt 2014  ? SHOULDER OPEN ROTATOR CUFF REPAIR Right 08/07/2019  ? Procedure: ROTATOR CUFF REPAIR SHOULDER OPEN;  Surgeon: Thornton Park, MD;  Location: [facility];  Service: Orthopedics;  Laterality: Right;  ? TONSILLECTOMY    ? ? ?There were no vitals filed for this visit. ? ? Subjective Assessment - 08/19/21 1031   ? ?  Subjective Patient reports   ? Patient is accompained by: Family member   ? Pertinent History Patient has seen this therapist in 2019. Was being treated at [facility] but is transferring to this clinic for balance training. PMH includes sleep apnea, HTN, aortic valve disease, GERD, genituourinary malignancy; renal, hydrocephalus s/p shunt placed 03/2020, DVT, Type II DM, obesity, OA, covid, depression, diabetic polyneuropathy, AD, pressure injury of right foot, and falls. Patient had R rotator cuff surgery a year and a half and has not had a good recovery process.  Patient has a history of falling prior to COVID, falls decreased after shunt but still are present. Will see a podiatrist soon for his pressure injury on R foot. Referral is for diabetic polyneuropathy, foot drop R foot, and falling. Patient reports >30 falls in past 6 months.   ? Limitations Sitting;Standing;Walking;House hold activities;Other (comment)   ? How long can you sit comfortably? a few hours   ? How long can you stand comfortably? immediately unstable   ? How long can you walk comfortably? immediately unstable   ? Patient Stated Goals improve leg strength. more independent, less falls   ? Pain Onset More than a month ago   ? Pain Onset In the past 7 days   ? ?  ?  ? ?  ? ? ?  ?  Exercise/Activity Sets/Reps/Time/ Resistance Assistance Charge type Comments  ?HS curl 15xL9 ?12*L10 ?12 reps at [place] with positioning ?  ?   ? Therex  ? Setup assistance to accommodate his size  ?In wellzone  ?Patient able to concentrate and perform with controlled eccentric motion.   ?Knee extension machine 12 x L 7 ?9 x L 8  ?x Level  ?10 x 5 sec hold at end range L2 minA with positioning ?(Back rest all way back) ?   ? Therex  ? Setup assistance to accommodate his size  ?In wellzone  ?VC for slow control motion- patient able to comply.   ?Interval training on nustep  ?-LE only L2 for rest  ?L6 for interval  ?8 min total ?39min:1 min  minA with positioning  of LE  ?  ?   ? Therex ?  ?  ?  ?   ? In wellzone, encourage to take full rest breaks following high intensity interval ?-monitor R LE to prevent excessive ER as if too much ER pr knee can skid against nustep handle ?-supervision level assist getting on/ off ?-Able to maintain > 50 SPM  ?           ?           ?           ?           ?           ?           ?Treatment Provided this session  ?  ?Pt educated throughout session about proper posture and technique with exercises. Improved exercise technique, movement at target joints, use of target muscles after min to mod verbal, visual, tactile cues. ?  ?  ? ? ? ? ? ? ? ? ? ? ? ? ? ? ? ? ? ? ? ? ? ? ? ? ? ? ? ? PT Short Term Goals - 06/26/21 0956   ? ?  ? PT SHORT TERM GOAL #1  ? Title Patient will be independent in home exercise program to improve strength/mobility for better functional independence with ADLs.   ? Baseline 10/11 HEP given, 06/03/21 updated HEP provided, 2/10 HEP going well.   ? Time 4   ? Period Weeks   ? Status Achieved   ? Target Date 07/29/21   ?  ? PT SHORT TERM GOAL #2  ? Title Patient will report no falls in past two weeks indicating improved stability and decreased fall risk.   ? Baseline 10/11; multiple falls 12/2: fall within this week (reports decrease in frequency) 1/18: fall within past week 2/10 no falls in several weeks, ambulating with cane around the home.   ? Time 4   ? Period Weeks   ? Status Achieved   ? Target Date 03/24/21   ? ?  ?  ? ?  ? ? ? ? PT Long Term Goals - 06/26/21 1002   ? ?  ? PT LONG TERM GOAL #1  ? Title Patient will increase FOTO score to equal to or greater than  58%   to demonstrate statistically significant improvement in mobility and quality of life.   ? Baseline 10/11: 41%, 04/17/21: 53 2/10: 53%   ? Time 12   ? Period Weeks   ? Status On-going   ? Target Date 08/26/21   ?  ? PT LONG TERM GOAL #2  ? Title Patient will increase  ABC scale score >60% to demonstrate better functional mobility and better confidence  with ADLs.    ? Baseline 10/11: 32.5% 12/2:50.1% 06/03/21: 53.4%   ? Time 12   ? Period Weeks   ? Status On-going   ? Target Date 08/26/21   ?  ? PT LONG TERM GOAL #3  ? Title Patient will increase Berg Balance score by > 6 points (21/56)  to demonstrate decreased fall risk during functional activities.   ? Baseline 10/11: 15/56 06/03/21: 26   ? Time 12   ? Period Weeks   ? Status On-going   ? Target Date 08/26/21   ?  ? PT LONG TERM GOAL #4  ? Title Patient (> 82 years old) will complete five times sit to stand test in < 20 seconds from 24 inch surface indicating an increased LE strength and improved balance.   ? Baseline 10/11: unable to perform from standard height chair, 24 inches 24.52 06/03/21: 22.5sec from 26 in table height 06/26/21: 16.9s from 24 inch table height   ? Time 12   ? Period Weeks   ? Status Partially Met   ? Target Date 08/26/21   ?  ? PT LONG TERM GOAL #5  ? Title Patient will increase BLE gross strength to 4+/5 ( excluding ankle strength)  to improve functional strength for independent gait, increased standing tolerance and increased ADL ability.   ? Baseline 10/11: see note 2/10: not tested   ? Time 12   ? Period Weeks   ? Status On-going   ? Target Date 08/26/21   ? ?  ?  ? ?  ? ? ? ? ? ? ? ? Plan - 08/19/21 1536   ? ? Clinical Impression Statement Patient presented with good motivation and participation in LE strengthening today. He continues to demonstrate fatigue as limiting factor  but able to perform well with pyramid designed strengthening again today. He was fatigued at end of session with mild difficulty standing. Patient will continue to benefit from skilled physical therapy intervention in order to improve his lower extremity strength, reduce fall risk, improve mobility, improve his overall quality of life.   ? Personal Factors and Comorbidities Age;Comorbidity 3+;Finances;Fitness;Past/Current Experience;Social Background;Time since onset of injury/illness/exacerbation;Transportation    ? Comorbidities leep apnea, HTN, aortic valve disease, GERD, genituourinary malignancy; renal, hydrocephalus s/p shunt placed 03/2020, DVT, Type II DM, obesity, OA, covid, depression, diabetic polyneuropathy, A

## 2021-08-21 ENCOUNTER — Ambulatory Visit: Payer: Medicare Other

## 2021-08-21 DIAGNOSIS — M6281 Muscle weakness (generalized): Secondary | ICD-10-CM

## 2021-08-21 DIAGNOSIS — R269 Unspecified abnormalities of gait and mobility: Secondary | ICD-10-CM

## 2021-08-21 DIAGNOSIS — R262 Difficulty in walking, not elsewhere classified: Secondary | ICD-10-CM

## 2021-08-21 DIAGNOSIS — R2689 Other abnormalities of gait and mobility: Secondary | ICD-10-CM

## 2021-08-21 DIAGNOSIS — R2681 Unsteadiness on feet: Secondary | ICD-10-CM

## 2021-08-21 DIAGNOSIS — R278 Other lack of coordination: Secondary | ICD-10-CM

## 2021-08-21 NOTE — Therapy (Signed)
[place] ?[facility] ?[address] ?[place], [place], [address] ?Phone: [phone]   Fax:  [phone] ? ?Physical Therapy Treatment ? ?Patient Details  ?Name: Nathan Dean ?MRN: [id number] ?Date of Birth: 05/28/1955 ?Referring Provider (PT): Paich, Katilin PA ? ? ?Encounter Date: 08/21/2021 ? ? PT End of Session - 08/21/21 1022   ? ? Visit Number 28   ? Number of Visits 40   ? Date for PT Re-Evaluation 08/26/21   ? Authorization Type 1/10 eval 02/24/21 Auth 06/03/21-08/26/21   ? Progress Note Due on Visit 30   ? PT Start Time 1015   ? PT Stop Time 1056   ? PT Time Calculation (min) 41 min   ? Equipment Utilized During Treatment Gait belt   ? Activity Tolerance Patient tolerated treatment well;Patient limited by fatigue   ? Behavior During Therapy WFL for tasks assessed/performed   ? ?  ?  ? ?  ? ? ?Past Medical History:  ?Diagnosis Date  ? Complication of anesthesia   ? 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 Diabetes (HCC)   ? GERD (gastroesophageal reflux disease)   ? Hyperlipidemia   ? Hypertension   ? Obesity   ? Sleep apnea   ? ? ?Past Surgical History:  ?Procedure Laterality Date  ? BACK SURGERY    ? in the 80's -herniated disc  ? BACK SURGERY    ? L4 bone spur 1990  ? CARPAL TUNNEL RELEASE Right 12/28/2016  ? Procedure: RIGHT ULNAR AND MEDIAN NEUROPLASTY AT WRIST;  Surgeon: Thompson, David, MD;  Location: [facility];  Service: Orthopedics;  Laterality: Right;  ? CARPAL TUNNEL RELEASE Left   ? 25 years ago  ? CATARACT EXTRACTION W/ INTRAOCULAR LENS IMPLANT Bilateral   ? REPLACEMENT TOTAL KNEE BILATERAL  2014  ? left 2012 rt 2014  ? SHOULDER OPEN ROTATOR CUFF REPAIR Right 08/07/2019  ? Procedure: ROTATOR CUFF REPAIR SHOULDER OPEN;  Surgeon: Krasinski, Kevin, MD;  Location: [facility];  Service: Orthopedics;  Laterality: Right;  ? TONSILLECTOMY    ? ? ?There were no vitals filed for this visit. ? ? Subjective Assessment - 08/21/21 1039   ? ?  Subjective Patient reports feeling pretty good today with no new issues. Denies any falls   ? Patient is accompained by: Family member   ? Pertinent History Patient has seen this therapist in 2019. [facility] is [facility] to this clinic for balance training. PMH includes sleep apnea, HTN, aortic valve disease, GERD, genituourinary malignancy; renal, hydrocephalus s/p shunt placed 03/2020, DVT, Type II DM, obesity, OA, covid, depression, diabetic polyneuropathy, AD, pressure injury of right foot, and falls. Patient had R rotator cuff surgery a year and a half and has not had a good recovery process.  Patient has a history of falling prior to COVID, falls decreased after shunt but still are present. Will see a podiatrist soon for his pressure injury on R foot. Referral is for diabetic polyneuropathy, foot drop R foot, and falling. Patient reports >30 falls in past 6 months.   ? Limitations Sitting;Standing;Walking;House hold activities;Other (comment)   ? How long can you sit comfortably? a few hours   ? How long can you stand comfortably? immediately unstable   ? How long can you walk comfortably? immediately unstable   ? Patient Stated Goals improve leg strength. more independent, less falls   ? Currently in Pain? No/denies   ? Pain Onset --   ?   Pain Onset --   ? ?  ?  ? ?  ? ? ? ? ?  ?Exercise/Activity Sets/Reps/Time/ Resistance Assistance Charge type Comments  ?HS curl 15xL9 ?12*L10 ?12 reps at L11  ?12 reps at L12 minA with positioning ?  ?   ? Therex  ? Setup assistance to accommodate his size  ?In wellzone  ?*Patient reported feeling stronger and wanted to try last set at L12 and did well with full ROM  ?Knee extension machine 12 x L 6 ?10 x L 8  ?10 x L9  ?12 at L7 minA with positioning ?(Back rest all way back) ?   ? Therex  ? Setup assistance to accommodate his size  ?In wellzone  ?VC for slow control motion- patient able to comply.   ?Interval training on nustep  ?-LE only  L1 x 1 min ?L7 x 1 min ?L1 x 1 min ?L2 x 1 min ?L8 x 1 min ?L2 x 1 min minA with positioning of LE  ?  ?   ? Therex ?  ?  ?  ?   ? In wellzone, encourage to take full rest breaks following high intensity interval ?-supervision level assist getting on/ off ?-Able to maintain > 50 SPM during both easier and harder levels- No evidence of excessive ER on Nustep today.   ? Standing calf raises   BUE Support 2 sets of 10 reps  Set up assist  Therex  Patient complained of fatigue yet able to complete with brief rest break. Minimal Height on lift.   ? Standing Hip abd  BUE support x 12 reps each LE    Therex  Stopped due to fatigue. Patient required VC for cadence to slow down for optimal muscle control.   ?           ?           ?           ?           ?Treatment Provided this session  ?  ?Pt educated throughout session about proper posture and technique with exercises. Improved exercise technique, movement at target joints, use of target muscles after min to mod verbal, visual, tactile cues. ? ? ? ? ? ? ? ? ? ? ? ? ? ? ? ? ? ? ? ? ? ? ? ? PT Education - 08/21/21 1109   ? ? Education Details Importance of resistance training to build strength.   ? ?  ?  ? ?  ? ? ? PT Short Term Goals - 06/26/21 0956   ? ?  ? PT SHORT TERM GOAL #1  ? Title Patient will be independent in home exercise program to improve strength/mobility for better functional independence with ADLs.   ? Baseline 10/11 HEP given, 06/03/21 updated HEP provided, 2/10 HEP going well.   ? Time 4   ? Period Weeks   ? Status Achieved   ? Target Date 07/29/21   ?  ? PT SHORT TERM GOAL #2  ? Title Patient will report no falls in past two weeks indicating improved stability and decreased fall risk.   ? Baseline 10/11; multiple falls 12/2: fall within this week (reports decrease in frequency) 1/18: fall within past week 2/10 no falls in several weeks, ambulating with cane around the home.   ? Time 4   ? Period Weeks   ? Status Achieved   ? Target Date 03/24/21   ? ?  ?   ? ?  ? ? ? ?   PT Long Term Goals - 06/26/21 1002   ? ?  ? PT LONG TERM GOAL #1  ? Title Patient will increase FOTO score to equal to or greater than  58%   to demonstrate statistically significant improvement in mobility and quality of life.   ? Baseline 10/11: 41%, 04/17/21: 53 2/10: 53%   ? Time 12   ? Period Weeks   ? Status On-going   ? Target Date 08/26/21   ?  ? PT LONG TERM GOAL #2  ? Title Patient will increase ABC scale score >60% to demonstrate better functional mobility and better confidence with ADLs.    ? Baseline 10/11: 32.5% 12/2:50.1% 06/03/21: 53.4%   ? Time 12   ? Period Weeks   ? Status On-going   ? Target Date 08/26/21   ?  ? PT LONG TERM GOAL #3  ? Title Patient will increase Berg Balance score by > 6 points (21/56)  to demonstrate decreased fall risk during functional activities.   ? Baseline 10/11: 15/56 06/03/21: 26   ? Time 12   ? Period Weeks   ? Status On-going   ? Target Date 08/26/21   ?  ? PT LONG TERM GOAL #4  ? Title Patient (> 60 years old) will complete five times sit to stand test in < 20 seconds from 24 inch surface indicating an increased LE strength and improved balance.   ? Baseline 10/11: unable to perform from standard height chair, 24 inches 24.52 06/03/21: 22.5sec from 26 in table height 06/26/21: 16.9s from 24 inch table height   ? Time 12   ? Period Weeks   ? Status Partially Met   ? Target Date 08/26/21   ?  ? PT LONG TERM GOAL #5  ? Title Patient will increase BLE gross strength to 4+/5 ( excluding ankle strength)  to improve functional strength for independent gait, increased standing tolerance and increased ADL ability.   ? Baseline 10/11: see note 2/10: not tested   ? Time 12   ? Period Weeks   ? Status On-going   ? Target Date 08/26/21   ? ?  ?  ? ?  ? ? ? ? ? ? ? ? Plan - 08/21/21 1024   ? ? Clinical Impression Statement Patient presents with good motivation and participated well overall today. He was able to increase some resistance in LE Strengthening exercises as  well as able to add a couple of standing strengthening exercises. He was limited by fatigue overall today but able to increased resistance with weights and on Nustep today. Patient will continue to benefit from s

## 2021-08-24 ENCOUNTER — Encounter: Payer: Medicare Other | Admitting: Speech Pathology

## 2021-08-26 ENCOUNTER — Ambulatory Visit: Payer: Medicare Other

## 2021-08-26 DIAGNOSIS — R296 Repeated falls: Secondary | ICD-10-CM

## 2021-08-26 DIAGNOSIS — R2689 Other abnormalities of gait and mobility: Secondary | ICD-10-CM

## 2021-08-26 DIAGNOSIS — R269 Unspecified abnormalities of gait and mobility: Secondary | ICD-10-CM

## 2021-08-26 DIAGNOSIS — R278 Other lack of coordination: Secondary | ICD-10-CM

## 2021-08-26 DIAGNOSIS — R2681 Unsteadiness on feet: Secondary | ICD-10-CM

## 2021-08-26 DIAGNOSIS — R262 Difficulty in walking, not elsewhere classified: Secondary | ICD-10-CM

## 2021-08-26 DIAGNOSIS — M6281 Muscle weakness (generalized): Secondary | ICD-10-CM

## 2021-08-26 NOTE — Therapy (Signed)
[place] ?[facility] ?[address] ?[place], [place], [address] ?Phone: [phone]   Fax:  [phone] ? ?Physical Therapy Treatment/Recertification for dates 08/26/2021- 11/18/2021 ? ?Patient Details  ?Name: Nathan Dean ?MRN: [id number] ?Date of Birth: 09/10/1955 ?Referring Provider (PT): Vernard GamblesPaich, Katilin PA ? ? ?Encounter Date: 08/26/2021 ? ? PT End of Session - 08/26/21 1031   ? ? Visit Number 29   ? Number of Visits 41   ? Date for PT Re-Evaluation 11/18/21   ? Authorization Type 1/10 eval 02/24/21 Auth 06/03/21-08/26/21; Recert= 08/26/2021-11/18/2021   ? Progress Note Due on Visit 30   ? PT Start Time 1015   ? PT Stop Time 1059   ? PT Time Calculation (min) 44 min   ? Equipment Utilized During Treatment Gait belt   ? Activity Tolerance Patient tolerated treatment well;Patient limited by fatigue   ? Behavior During Therapy [facility] for tasks assessed/performed   ? ?  ?  ? ?  ? ? ?Past Medical History:  ?Diagnosis Date  ? Complication of anesthesia   ? 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 Diabetes (HCC)   ? GERD (gastroesophageal reflux disease)   ? Hyperlipidemia   ? Hypertension   ? Obesity   ? Sleep apnea   ? ? ?Past Surgical History:  ?Procedure Laterality Date  ? BACK SURGERY    ? in the 80's -herniated disc  ? BACK SURGERY    ? L4 bone spur 1990  ? CARPAL TUNNEL RELEASE Right 12/28/2016  ? Procedure: RIGHT ULNAR AND MEDIAN NEUROPLASTY AT WRIST;  Surgeon: Mack Hookhompson, David, MD;  Location: [facility];  Service: Orthopedics;  Laterality: Right;  ? CARPAL TUNNEL RELEASE Left   ? 25 years ago  ? CATARACT EXTRACTION W/ INTRAOCULAR LENS IMPLANT Bilateral   ? REPLACEMENT TOTAL KNEE BILATERAL  2014  ? left 2012 rt 2014  ? SHOULDER OPEN ROTATOR CUFF REPAIR Right 08/07/2019  ? Procedure: ROTATOR CUFF REPAIR SHOULDER OPEN;  Surgeon: Juanell FairlyKrasinski, Kevin, MD;  Location: [facility];  Service: Orthopedics;  Laterality: Right;  ? TONSILLECTOMY    ? ? ?There were no vitals  filed for this visit. ? ? Subjective Assessment - 08/26/21 1029   ? ? Subjective Patient reports he fell in home over a mat. Patient reports sore knees but doing okay otherwise   ? Patient is accompained by: Family member   ? Pertinent History Patient has seen this therapist in 2019. [facility] clinic for balance training. PMH includes sleep apnea, HTN, aortic valve disease, GERD, genituourinary malignancy; renal, hydrocephalus s/p shunt placed 03/2020, DVT, Type II DM, obesity, OA, covid, depression, diabetic polyneuropathy, AD, pressure injury of right foot, and falls. Patient had R rotator cuff surgery a year and a half and has not had a good recovery process.  Patient has a history of falling prior to COVID, falls decreased after shunt but still are present. Will see a podiatrist soon for his pressure injury on R foot. Referral is for diabetic polyneuropathy, foot drop R foot, and falling. Patient reports >30 falls in past 6 months.   ? Limitations Sitting;Standing;Walking;House hold activities;Other (comment)   ? How long can you sit comfortably? a few hours   ? How long can you stand comfortably? immediately unstable   ? How long can you walk comfortably? immediately unstable   ? Patient Stated Goals improve leg strength. more independent, less falls   ?  Currently in Pain? No/denies   ? ?  ?  ? ?  ? ? ?Interventions:  ? ?Reassessed all remaining goals for recert visit. ? ? ? ? [facility] PT Assessment - 08/26/21 2144   ? ?  ? Berg Balance Test  ? Sit to Stand Able to stand  independently using hands   ? Standing Unsupported Able to stand 2 minutes with supervision   ? Sitting with Back Unsupported but Feet Supported on Floor or Stool Able to sit safely and securely 2 minutes   ? Stand to Sit Controls descent by using hands   ? Transfers Able to transfer safely, definite need of hands   ? Standing Unsupported with Eyes Closed Able to stand 3 seconds   ? Standing  Unsupported with Feet Together Able to place feet together independently and stand for 1 minute with supervision   ? From Standing, Reach Forward with Outstretched Arm Can reach forward >12 cm safely (5")   ? From Standing Position, Pick up Object from Floor Unable to try/needs assist to keep balance   ? From Standing Position, Turn to Look Behind Over each Shoulder Turn sideways only but maintains balance   ? Turn 360 Degrees Needs assistance while turning   ? Standing Unsupported, Alternately Place Feet on Step/Stool Needs assistance to keep from falling or unable to try   ? Standing Unsupported, One Foot in Front Needs help to step but can hold 15 seconds   ? Standing on One Leg Unable to try or needs assist to prevent fall   ? Total Score 27   ? ?  ?  ? ?  ? ? ?Nustep: INTERVAL Training ?L1= 1 min ?L4= 1 min ?L1= 1 min ?L5= 1 min ?L1= 1 min ?L6 = ?Patient rated as medium performing with BLEs only with VC to keep SPM > 50.  ? ?- FOTO= 55% (improved)  ? ?-ABC scale= 67.5% (Achieved)  ? ?-BERG = 27/56 (improved)  ? ?-5 x sit to stand from 24 in height mat table = 20.04 sec with BUE touch.  (Improved)  ? ?-MMT= B hip flex= 3+/5; B knee ext= 4/5; B knee flex= 4/5 (ongoing)  ? ? ? ? ? ? ? ? ? ? ? ? ? ? ? ? ? ? ? ? ? PT Education - 08/26/21 1031   ? ? Education Details purpose of functional outcomes.   ? Person(s) Educated Patient   ? Methods Explanation;Demonstration;Tactile cues;Verbal cues   ? Comprehension Verbalized understanding;Returned demonstration;Need further instruction;Tactile cues required;Verbal cues required   ? ?  ?  ? ?  ? ? ? PT Short Term Goals - 06/26/21 0956   ? ?  ? PT SHORT TERM GOAL #1  ? Title Patient will be independent in home exercise program to improve strength/mobility for better functional independence with ADLs.   ? Baseline 10/11 HEP given, 06/03/21 updated HEP provided, 2/10 HEP going well.   ? Time 4   ? Period Weeks   ? Status Achieved   ? Target Date 07/29/21   ?  ? PT SHORT  TERM GOAL #2  ? Title Patient will report no falls in past two weeks indicating improved stability and decreased fall risk.   ? Baseline 10/11; multiple falls 12/2: fall within this week (reports decrease in frequency) 1/18: fall within past week 2/10 no falls in several weeks, ambulating with cane around the home.   ? Time 4   ? Period Weeks   ?  Status Achieved   ? Target Date 03/24/21   ? ?  ?  ? ?  ? ? ? ? PT Long Term Goals - 08/26/21 1048   ? ?  ? PT LONG TERM GOAL #1  ? Title Patient will increase FOTO score to equal to or greater than  58%   to demonstrate statistically significant improvement in mobility and quality of life.   ? Baseline 10/11: 41%, 04/17/21: 53 2/10: 53%; 08/26/2021=55%   ? Time 12   ? Period Weeks   ? Status On-going   ? Target Date 11/18/21   ?  ? PT LONG TERM GOAL #2  ? Title Patient will increase ABC scale score >60% to demonstrate better functional mobility and better confidence with ADLs.    ? Baseline 10/11: 32.5% 12/2:50.1% 06/03/21: 53.4%; 08/26/2021= 67.5%   ? Time 12   ? Period Weeks   ? Status Achieved   ? Target Date 08/26/21   ?  ? PT LONG TERM GOAL #3  ? Title Patient will increase Berg Balance score by > 6 points (21/56)  to demonstrate decreased fall risk during functional activities.   ? Baseline 10/11: 15/56 06/03/21: 26; 08/26/2021= 27/56   ? Time 12   ? Period Weeks   ? Status On-going   ? Target Date 11/18/21   ?  ? PT LONG TERM GOAL #4  ? Title Patient (> 79 years old) will complete five times sit to stand test in < 20 seconds from 24 inch surface indicating an increased LE strength and improved balance.   ? Baseline 10/11: unable to perform from standard height chair, 24 inches 24.52 06/03/21: 22.5sec from 26 in table height 06/26/21: 16.9s from 24 inch table height   ? Time 12   ? Period Weeks   ? Status Achieved   ? Target Date 08/26/21   ?  ? PT LONG TERM GOAL #5  ? Title Patient will increase BLE gross strength to 4+/5 ( excluding ankle strength)  to improve functional  strength for independent gait, increased standing tolerance and increased ADL ability.   ? Baseline 10/11: see note 2/10: not tested; 08/26/2021=B hip flex= 3+/5; B knee ext= 4/5; B knee flex= 4/5   ? Time 12

## 2021-08-28 ENCOUNTER — Ambulatory Visit: Payer: Medicare Other | Admitting: Physical Therapy

## 2021-09-02 ENCOUNTER — Ambulatory Visit: Payer: Medicare Other

## 2021-09-02 DIAGNOSIS — R269 Unspecified abnormalities of gait and mobility: Secondary | ICD-10-CM | POA: Diagnosis not present

## 2021-09-02 DIAGNOSIS — R2681 Unsteadiness on feet: Secondary | ICD-10-CM

## 2021-09-02 DIAGNOSIS — M6281 Muscle weakness (generalized): Secondary | ICD-10-CM

## 2021-09-02 DIAGNOSIS — R278 Other lack of coordination: Secondary | ICD-10-CM

## 2021-09-02 DIAGNOSIS — R2689 Other abnormalities of gait and mobility: Secondary | ICD-10-CM

## 2021-09-02 DIAGNOSIS — R296 Repeated falls: Secondary | ICD-10-CM

## 2021-09-02 DIAGNOSIS — R262 Difficulty in walking, not elsewhere classified: Secondary | ICD-10-CM

## 2021-09-02 NOTE — Therapy (Signed)
[place] [facility] ?[address] ?[place], [place], [address] ?Phone: [phone]   Fax:  [phone] ? ?Physical Therapy Treatment ? ?Patient Details  ?Name: Nathan Dean ?MRN: [id number] ?Date of Birth: 12/20/1955 ?Referring Provider (PT): Vernard GamblesPaich, Katilin PA ? ? ?Encounter Date: 09/02/2021 ? ? PT End of Session - 09/02/21 1008   ? ? Visit Number 30   ? Number of Visits 41   ? Date for PT Re-Evaluation 11/18/21   ? Authorization Type 1/10 eval 02/24/21 Auth 06/03/21-08/26/21; Recert= 08/26/2021-11/18/2021   ? Progress Note Due on Visit 30   ? PT Start Time 1015   ? PT Stop Time 1059   ? PT Time Calculation (min) 44 min   ? Equipment Utilized During Treatment Gait belt   ? Activity Tolerance Patient tolerated treatment well;Patient limited by fatigue   ? Behavior [facility] for tasks assessed/performed   ? ?  ?  ? ?  ? ? ?Past Medical History:  ?Diagnosis Date  ? Complication of anesthesia   ? 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 Diabetes (HCC)   ? GERD (gastroesophageal reflux disease)   ? Hyperlipidemia   ? Hypertension   ? Obesity   ? Sleep apnea   ? ? ?Past Surgical History:  ?Procedure Laterality Date  ? BACK SURGERY    ? in the 80's -herniated disc  ? BACK SURGERY    ? L4 bone spur 1990  ? CARPAL TUNNEL RELEASE Right 12/28/2016  ? Procedure: RIGHT ULNAR AND MEDIAN NEUROPLASTY AT WRIST;  Surgeon: Mack Hookhompson, David, MD;  Location: [facility];  Service: Orthopedics;  Laterality: Right;  ? CARPAL TUNNEL RELEASE Left   ? 25 years ago  ? CATARACT EXTRACTION W/ INTRAOCULAR LENS IMPLANT Bilateral   ? REPLACEMENT TOTAL KNEE BILATERAL  2014  ? left 2012 rt 2014  ? SHOULDER OPEN ROTATOR CUFF REPAIR Right 08/07/2019  ? Procedure: ROTATOR CUFF REPAIR SHOULDER OPEN;  Surgeon: Juanell FairlyKrasinski, Kevin, MD;  Location: [facility];  Service: Orthopedics;  Laterality: Right;  ? TONSILLECTOMY    ? ? ?There were no vitals filed for this visit. ? ? Subjective Assessment -  09/02/21 1007   ? ? Subjective Patient reports no further falls and states doing okay today.   ? Patient is accompained by: Family member   ? Pertinent History Patient has seen this therapist in 2019. [facility] but is transferring to this clinic for balance training. PMH includes sleep apnea, HTN, aortic valve disease, GERD, genituourinary malignancy; renal, hydrocephalus s/p shunt placed 03/2020, DVT, Type II DM, obesity, OA, covid, depression, diabetic polyneuropathy, AD, pressure injury of right foot, and falls. Patient had R rotator cuff surgery a year and a half and has not had a good recovery process.  Patient has a history of falling prior to COVID, falls decreased after shunt but still are present. Will see a podiatrist soon for his pressure injury on R foot. Referral is for diabetic polyneuropathy, foot drop R foot, and falling. Patient reports >30 falls in past 6 months.   ? Limitations Sitting;Standing;Walking;House hold activities;Other (comment)   ? How long can you sit comfortably? a few hours   ? How long can you stand comfortably? immediately unstable   ? How long can you walk comfortably? immediately unstable   ? Patient Stated Goals improve leg strength. more independent, less falls   ? Currently in Pain? Yes   ? Pain Score 4    ?  Pain Location Shoulder   ? Pain Orientation Right   ? Pain Descriptors / Indicators Aching;Discomfort   ? Pain Type Chronic pain   ? Pain Frequency Intermittent   ? Aggravating Factors  UE weight bearing, Shoulder movement   ? Pain Relieving Factors Rest   ? ?  ?  ? ?  ? ? ?INTERVENTIONS:   ? ?Therapeutic Exercises:  ? ?Sit to stand from higher level surface (around 24 in) with only assist of left UE (to prepare for upcoming potential Right shoulder)  ? ?At Support bar: BUE Support (VC to try to decrease UE support to focus more on LE weight bearing)  ? ?Standing hip abd ?Standing knee flex ?Standing hip ext ?Standing minisquats ?Standing  forward step taps with BUE support ?10-12 reps each leg with brief rest breaks.  ?Sit to stand from seat of 4WW- Patient continues to require both UE to stand due to weakness.  ?Education provided throughout session via VC/TC and demonstration to facilitate movement at target joints and correct muscle activation for all testing and exercises performed.  ? ?Added below HEP:  ? ? ?Access Code: [email or web address] ?URL: [email or web address] ?Date: 09/02/2021 ?Prepared by: Maureen Ralphs ? ?Exercises ?- Sit to Stand with Arms Crossed  - 1 x daily - 3 x weekly - 3 sets - 10 reps ?- Heel Raises with Counter Support  - 1 x daily - 3 x weekly - 3 sets - 10 reps ?- Standing Hip Abduction with Counter Support  - 1 x daily - 3 x weekly - 3 sets - 10 reps ?- Standing Knee Flexion with Counter Support  - 1 x daily - 3 x weekly - 3 sets - 10 reps ?- Mini Squat with Counter Support  - 1 x daily - 3 x weekly - 3 sets - 10 reps ?- Standing Forward Step Taps with Counter Support  - 1 x daily - 7 x weekly - 3 sets - 10 reps ? ? ? ? ? ? ? ? ? ? ? ? ? ? ? ? ? ? ? PT Education - 09/03/21 0842   ? ? Education Details HEP and Exercise technique   ? Person(s) Educated Patient   ? Methods Explanation;Demonstration;Tactile cues;Verbal cues;Handout   ? Comprehension Verbalized understanding;Returned demonstration;Verbal cues required;Tactile cues required;Need further instruction   ? ?  ?  ? ?  ? ? ? PT Short Term Goals - 06/26/21 0956   ? ?  ? PT SHORT TERM GOAL #1  ? Title Patient will be independent in home exercise program to improve strength/mobility for better functional independence with ADLs.   ? Baseline 10/11 HEP given, 06/03/21 updated HEP provided, 2/10 HEP going well.   ? Time 4   ? Period Weeks   ? Status Achieved   ? Target Date 07/29/21   ?  ? PT SHORT TERM GOAL #2  ? Title Patient will report no falls in past two weeks indicating improved stability and decreased fall risk.   ? Baseline 10/11; multiple falls 12/2: fall  within this week (reports decrease in frequency) 1/18: fall within past week 2/10 no falls in several weeks, ambulating with cane around the home.   ? Time 4   ? Period Weeks   ? Status Achieved   ? Target Date 03/24/21   ? ?  ?  ? ?  ? ? ? ? PT Long Term Goals - 08/26/21 1048   ? ?  ? PT LONG TERM GOAL #1  ?  Title Patient will increase FOTO score to equal to or greater than  58%   to demonstrate statistically significant improvement in mobility and quality of life.   ? Baseline 10/11: 41%, 04/17/21: 53 2/10: 53%; 08/26/2021=55%   ? Time 12   ? Period Weeks   ? Status On-going   ? Target Date 11/18/21   ?  ? PT LONG TERM GOAL #2  ? Title Patient will increase ABC scale score >60% to demonstrate better functional mobility and better confidence with ADLs.    ? Baseline 10/11: 32.5% 12/2:50.1% 06/03/21: 53.4%; 08/26/2021= 67.5%   ? Time 12   ? Period Weeks   ? Status Achieved   ? Target Date 08/26/21   ?  ? PT LONG TERM GOAL #3  ? Title Patient will increase Berg Balance score by > 6 points (21/56)  to demonstrate decreased fall risk during functional activities.   ? Baseline 10/11: 15/56 06/03/21: 26; 08/26/2021= 27/56   ? Time 12   ? Period Weeks   ? Status On-going   ? Target Date 11/18/21   ?  ? PT LONG TERM GOAL #4  ? Title Patient (> 59 years old) will complete five times sit to stand test in < 20 seconds from 24 inch surface indicating an increased LE strength and improved balance.   ? Baseline 10/11: unable to perform from standard height chair, 24 inches 24.52 06/03/21: 22.5sec from 26 in table height 06/26/21: 16.9s from 24 inch table height   ? Time 12   ? Period Weeks   ? Status Achieved   ? Target Date 08/26/21   ?  ? PT LONG TERM GOAL #5  ? Title Patient will increase BLE gross strength to 4+/5 ( excluding ankle strength)  to improve functional strength for independent gait, increased standing tolerance and increased ADL ability.   ? Baseline 10/11: see note 2/10: not tested; 08/26/2021=B hip flex= 3+/5; B knee  ext= 4/5; B knee flex= 4/5   ? Time 12   ? Period Weeks   ? Status On-going   ? Target Date 11/18/21   ?  ? Additional Long Term Goals  ? Additional Long Term Goals Yes   ?  ? PT LONG TERM GOAL #6  ? Title Pa

## 2021-09-03 ENCOUNTER — Telehealth: Payer: Self-pay | Admitting: *Deleted

## 2021-09-03 ENCOUNTER — Encounter: Payer: Self-pay | Admitting: Student in an Organized Health Care Education/Training Program

## 2021-09-03 NOTE — Telephone Encounter (Signed)
Attempted to call for pre appointment review of allergies/meds. Message left. 

## 2021-09-04 ENCOUNTER — Ambulatory Visit: Payer: Medicare Other | Admitting: Physical Therapy

## 2021-09-07 ENCOUNTER — Encounter: Payer: Self-pay | Admitting: Student in an Organized Health Care Education/Training Program

## 2021-09-07 ENCOUNTER — Telehealth: Payer: Self-pay

## 2021-09-07 ENCOUNTER — Ambulatory Visit
Payer: Medicare Other | Attending: Student in an Organized Health Care Education/Training Program | Admitting: Student in an Organized Health Care Education/Training Program

## 2021-09-07 DIAGNOSIS — Z9889 Other specified postprocedural states: Secondary | ICD-10-CM | POA: Diagnosis not present

## 2021-09-07 DIAGNOSIS — M75101 Unspecified rotator cuff tear or rupture of right shoulder, not specified as traumatic: Secondary | ICD-10-CM

## 2021-09-07 DIAGNOSIS — M12811 Other specific arthropathies, not elsewhere classified, right shoulder: Secondary | ICD-10-CM

## 2021-09-07 DIAGNOSIS — M25511 Pain in right shoulder: Secondary | ICD-10-CM | POA: Diagnosis not present

## 2021-09-07 DIAGNOSIS — G8929 Other chronic pain: Secondary | ICD-10-CM

## 2021-09-07 DIAGNOSIS — G894 Chronic pain syndrome: Secondary | ICD-10-CM | POA: Diagnosis not present

## 2021-09-07 NOTE — Progress Notes (Signed)
Okay thank you patient: Nathan Dean  Service Category: E/M  Provider:  , MD  ?DOB: 10/02/1955  DOS: 09/07/2021  Location: Office  ?MRN: [id number]  Setting: Ambulatory outpatient  Referring Provider: [place]  ?Type: Established Patient  Specialty: Interventional Pain Management  PCP: [facility]  ?Location: Remote location  Delivery: TeleHealth    ? ?Virtual Encounter - Pain Management ?PROVIDER NOTE: Information contained herein reflects review and annotations entered in association with encounter. Interpretation of such information and data should be left to medically-trained personnel. Information provided to patient can be located elsewhere in the medical record under "Patient Instructions". Document created using STT-dictation technology, any transcriptional errors that may result from process are unintentional.  ?  ?Contact & Pharmacy ?Preferred: [phone] ?Home: [phone] (home) ?Mobile: [phone] (mobile) ?E-mail: [email or web address]  ?[id number] - [place], [place] - [address] ?[address] ?[place] [address] ?Phone: [phone] Fax: [phone] ?  ?Pre-screening  ?Nathan Dean offered "in-person" vs "virtual" encounter. He indicated preferring virtual for this encounter.  ? ?Reason ?COVID-19*  Social distancing based on CDC and AMA recommendations.  ? ?I contacted Nathan Dean on 09/07/2021 via telephone.      I clearly identified myself as  , MD. I verified that I was speaking with the correct person using two identifiers (Name: Nathan Dean, and date of birth: 10/17/1955). ? ?Consent ?I sought verbal advanced consent from Nathan Dean for virtual visit interactions. I informed Nathan Dean of possible security and privacy concerns, risks, and limitations associated with providing "not-in-person" medical evaluation and management services. I also informed Nathan Dean of the availability of "in-person" appointments. Finally,  I informed him that there would be a charge for the virtual visit and that he could be  personally, fully or partially, financially responsible for it. Nathan Dean expressed understanding and agreed to proceed.  ? ?Historic Elements   ?Nathan Dean is a 66 y.o. year old, male patient evaluated today after our last contact on 08/10/2021. Nathan Dean  has a past medical history of Complication of anesthesia, Diabetes (HCC), GERD (gastroesophageal reflux disease), Hyperlipidemia, Hypertension, Obesity, and Sleep apnea. He also  has a past surgical history that includes Replacement total knee bilateral (2014); Carpal tunnel release (Right, 12/28/2016); Carpal tunnel release (Left); Tonsillectomy; Cataract extraction w/ intraocular lens implant (Bilateral); Back surgery; Back surgery; and Shoulder open rotator cuff repair (Right, 08/07/2019). Nathan Dean has a current medication list which includes the following prescription(s): amlodipine, one touch ultra system kit, bupropion, carvedilol, doxycycline, duloxetine, duloxetine, gabapentin, glucose blood, insulin aspart, insulin pen needle, loratadine, losartan, melatonin, meloxicam, metformin, omeprazole, one touch lancets, rosuvastatin, and senna-docusate. He  reports that he quit smoking about 25 years ago. His smoking use included cigarettes. He has a 40.00 pack-year smoking history. He has never used smokeless tobacco. He reports current alcohol use. He reports that he does not use drugs. Nathan Dean is allergic to penicillins, succinylcholine chloride, keflex [cephalexin], and sulfa antibiotics.  ? ?HPI  ?Today, he is being contacted for a post-procedure assessment. ? ? ?Post-procedure evaluation  ? Type: Suprascapular nerve block #1  ?Laterality:  Right ?Level: Superior to scapular spine, lateral to supraspinatus fossa (Suprascapular notch).  ?Imaging: Fluoroscopic guidance ?Anesthesia: Local anesthesia (1-2% Lidocaine) ?Anxiolysis: None                  ?Sedation: None. ?DOS: 08/10/2021  ?Performed by:  , MD ? ?Purpose: Diagnostic/Therapeutic ?Indications: Shoulder pain, severe enough to impact quality   of life and/or function. ?1. Chronic right shoulder pain   ?2. S/P right rotator cuff repair   ?3. Right rotator cuff tear arthropathy   ? ?NAS-11 score:  ? Pre-procedure: 8 /10  ? Post-procedure: 0-No pain/10  ? ?   ?Effectiveness:  ?Initial hour after procedure: 70 %  ?Subsequent 4-6 hours post-procedure: 90 %  ?Analgesia past initial 6 hours: 80 %  ?Ongoing improvement:  ?Analgesic:  40%, however pain is returning. ?Function: Nathan Dean reports improvement in function ?ROM: Nathan Dean reports improvement in ROM ? ? ?Laboratory Chemistry Profile  ? ?Renal ?Lab Results  ?Component Value Date  ? BUN 26 (H) 01/18/2020  ? CREATININE 0.82 01/18/2020  ? BCR 28 (H) 07/20/2017  ? GFRAA >60 01/18/2020  ? GFRNONAA >60 01/18/2020  ?  Hepatic ?Lab Results  ?Component Value Date  ? AST 22 08/11/2019  ? ALT 18 08/11/2019  ? ALBUMIN 3.5 08/11/2019  ? ALKPHOS 68 08/11/2019  ?  ?Electrolytes ?Lab Results  ?Component Value Date  ? NA 137 01/18/2020  ? K 4.0 01/18/2020  ? CL 101 01/18/2020  ? CALCIUM 9.3 01/18/2020  ? MG 2.3 08/17/2019  ?  Bone ?No results found for: VD25OH, VD125OH2TOT, VD3125OH2, VD2125OH2, 25OHVITD1, 25OHVITD2, 25OHVITD3, TESTOFREE, TESTOSTERONE  ?Inflammation (CRP: Acute Phase) (ESR: Chronic Phase) ?Lab Results  ?Component Value Date  ? ESRSEDRATE 77 (H) 08/16/2019  ? LATICACIDVEN 1.6 08/11/2019  ?    ?  ? ?Note: Above Lab results reviewed. ? ?Assessment  ?The primary encounter diagnosis was Chronic right shoulder pain. Diagnoses of S/P right rotator cuff repair, Right rotator cuff tear arthropathy, and Chronic pain syndrome were also pertinent to this visit. ? ?Plan of Care  ? ?Repeat right suprascapular nerve block #2, add additional volume.  Consider suprascapular nerve radiofrequency ablation or peripheral nerve stimulation of suprascapular  nerve  thereafter. ? ?Orders:  ?Orders Placed This Encounter  ?Procedures  ? SUPRASCAPULAR NERVE BLOCK  ?  For shoulder pain.  ?  Standing Status:   Future  ?  Standing Expiration Date:   12/07/2021  ?  Scheduling Instructions:  ?   Right suprascapular nerve block #2  ?   Level(s): Suprascapular notch  ?   Sedation: Patient's choice.  ?   Scheduling Timeframe: As permitted by the schedule  ?  Order Specific Question:   Where will this procedure be performed?  ?  Answer:   [facility]  ? ?Follow-up plan:   ?Return in about 1 week (around 09/14/2021) for Right SSNB #2, in clinic NS.   ?  ?Right SSNB #1 08/10/21 ?  ?Recent Visits ?Date Type Provider Dept  ?08/10/21 Procedure visit , , MD [facility]  ?07/30/21 Office Visit , , MD [facility]  ?Showing recent visits within past 90 days and meeting all other requirements ?Today's Visits ?Date Type Provider Dept  ?09/07/21 Office Visit , , MD [facility]  ?Showing today's visits and meeting all other requirements ?Future Appointments ?No visits were found meeting these conditions. ?Showing future appointments within next 90 days and meeting all other requirements ? ?I discussed the assessment and treatment plan with the patient. The patient was provided an opportunity to ask questions and all were answered. The patient agreed with the plan and demonstrated an understanding of the instructions. ? ?Patient advised to call back or seek an in-person evaluation if the symptoms or condition worsens. ? ?Duration of encounter: 20minutes. ? ?Note by:  , MD ?Date: 09/07/2021;   Time: 11:44 AM ?

## 2021-09-09 ENCOUNTER — Telehealth: Payer: Medicare Other | Admitting: Student in an Organized Health Care Education/Training Program

## 2021-09-11 ENCOUNTER — Ambulatory Visit: Payer: Medicare Other | Admitting: Physical Therapy

## 2021-09-11 ENCOUNTER — Other Ambulatory Visit: Payer: Self-pay | Admitting: Neurology

## 2021-09-11 ENCOUNTER — Other Ambulatory Visit (HOSPITAL_COMMUNITY): Payer: Self-pay | Admitting: Neurology

## 2021-09-11 DIAGNOSIS — R413 Other amnesia: Secondary | ICD-10-CM

## 2021-09-14 ENCOUNTER — Other Ambulatory Visit: Payer: Self-pay | Admitting: Neurology

## 2021-09-14 DIAGNOSIS — Z982 Presence of cerebrospinal fluid drainage device: Secondary | ICD-10-CM

## 2021-09-16 ENCOUNTER — Ambulatory Visit
Admission: RE | Admit: 2021-09-16 | Discharge: 2021-09-16 | Disposition: A | Discharge status: Home / Self Care | Payer: Medicare Other | Source: Ambulatory Visit | Attending: Student in an Organized Health Care Education/Training Program | Admitting: Student in an Organized Health Care Education/Training Program

## 2021-09-16 ENCOUNTER — Ambulatory Visit (HOSPITAL_BASED_OUTPATIENT_CLINIC_OR_DEPARTMENT_OTHER): Payer: Medicare Other | Admitting: Student in an Organized Health Care Education/Training Program

## 2021-09-16 ENCOUNTER — Encounter: Payer: Self-pay | Admitting: Student in an Organized Health Care Education/Training Program

## 2021-09-16 VITALS — BP 142/82 | HR 70 | Temp 97.2°F | Resp 18 | Ht >= 80 in | Wt 370.0 lb

## 2021-09-16 DIAGNOSIS — G894 Chronic pain syndrome: Secondary | ICD-10-CM | POA: Diagnosis present

## 2021-09-16 DIAGNOSIS — M25512 Pain in left shoulder: Secondary | ICD-10-CM

## 2021-09-16 DIAGNOSIS — M75101 Unspecified rotator cuff tear or rupture of right shoulder, not specified as traumatic: Secondary | ICD-10-CM | POA: Diagnosis present

## 2021-09-16 DIAGNOSIS — M12811 Other specific arthropathies, not elsewhere classified, right shoulder: Secondary | ICD-10-CM

## 2021-09-16 DIAGNOSIS — Z9889 Other specified postprocedural states: Secondary | ICD-10-CM | POA: Diagnosis present

## 2021-09-16 DIAGNOSIS — G8929 Other chronic pain: Secondary | ICD-10-CM | POA: Insufficient documentation

## 2021-09-16 DIAGNOSIS — M25511 Pain in right shoulder: Secondary | ICD-10-CM

## 2021-09-16 MED ORDER — ROPIVACAINE HCL 2 MG/ML IJ SOLN
INTRAMUSCULAR | Status: AC
  Filled 2021-09-16: qty 20

## 2021-09-16 MED ORDER — IOHEXOL 180 MG/ML  SOLN
INTRAMUSCULAR | Status: AC
  Filled 2021-09-16: qty 20

## 2021-09-16 MED ORDER — ROPIVACAINE HCL 2 MG/ML IJ SOLN: 4.0000 mL | Freq: Once | INTRAMUSCULAR | Status: DC

## 2021-09-16 MED ORDER — LIDOCAINE HCL (PF) 2 % IJ SOLN
INTRAMUSCULAR | Status: AC
  Filled 2021-09-16: qty 10

## 2021-09-16 MED ORDER — DEXAMETHASONE SODIUM PHOSPHATE 10 MG/ML IJ SOLN: 10.0000 mg | Freq: Once | INTRAMUSCULAR | Status: DC

## 2021-09-16 MED ORDER — IOHEXOL 180 MG/ML  SOLN: 10.0000 mL | Freq: Once | INTRAMUSCULAR | Status: DC

## 2021-09-16 MED ORDER — LIDOCAINE HCL 2 % IJ SOLN: 20.0000 mL | Freq: Once | INTRAMUSCULAR | Status: DC

## 2021-09-16 MED ORDER — TIZANIDINE HCL 4 MG PO TABS: 4.0000 mg | ORAL_TABLET | Freq: Two times a day (BID) | ORAL | 5 refills | Status: AC | PRN

## 2021-09-16 MED ORDER — DEXAMETHASONE SODIUM PHOSPHATE 10 MG/ML IJ SOLN
INTRAMUSCULAR | Status: AC
  Filled 2021-09-16: qty 1

## 2021-09-16 NOTE — Patient Instructions (Addendum)
Pain Management Discharge Instructions ? ?General Discharge Instructions : ? ?If you need to reach your doctor call: Monday-Friday 8:00 am - 4:00 pm at [phone] or toll free [phone].  After clinic hours [phone] to have operator reach doctor. ? ?Bring all of your medication bottles to all your appointments in the pain clinic. ? ?To cancel or reschedule your appointment with Pain Management please remember to call 24 hours in advance to avoid a fee. ? ?Refer to the educational materials which you have been given on: General Risks, I had my Procedure. Discharge Instructions, Post Sedation. ? ?Post Procedure Instructions: ? ?The drugs you were given will stay in your system until tomorrow, so for the next 24 hours you should not drive, make any legal decisions or drink any alcoholic beverages. ? ?You may eat anything you prefer, but it is better to start with liquids then soups and crackers, and gradually work up to solid foods. ? ?Please notify your doctor immediately if you have any unusual bleeding, trouble breathing or pain that is not related to your normal pain. ? ?Depending on the type of procedure that was done, some parts of your body may feel week and/or numb.  This usually clears up by tonight or the next day. ? ?Walk with the use of an assistive device or accompanied by an adult for the 24 hours. ? ?You may use ice on the affected area for the first 24 hours.  Put ice in a Ziploc bag and cover with a towel and place against area 15 minutes on 15 minutes off.  You may switch to heat after 24 hours.____________________________________________________________________________________________ ? ?Appointment Policy Summary ? ?It is our goal and responsibility to provide the medical community with assistance in the evaluation and management of patients with chronic pain. Unfortunately our resources are limited. Because we do not have an unlimited amount of time, or available appointments, we are  required to closely monitor and manage their use. The following rules exist to maximize their use: ? ?Patient's responsibilities: ?1. Punctuality:  ?At what time should I arrive? You should be physically present in our office 30 minutes before your scheduled appointment. Your scheduled appointment is with your assigned healthcare provider. However, it takes 5-10 minutes to be "checked-in", and another 15 minutes for the nurses to do the admission. If you arrive to our office at the time you were given for your appointment, you will end up being at least 20-25 minutes late to your appointment with the provider. ?2. Tardiness:  ?What happens if I arrive only a few minutes after my scheduled appointment time? You will need to reschedule your appointment. The cutoff is your appointment time. This is why it is so important that you arrive at least 30 minutes before that appointment. If you have an appointment scheduled for 10:00 AM and you arrive at 10:01, you will be required to reschedule your appointment.  ?3. Plan ahead:  ?Always assume that you will encounter traffic on your way in. Plan for it. If you are dependent on a driver, make sure they understand these rules and the need to arrive early. ?4. Other appointments and responsibilities:  ?Avoid scheduling any other appointments before or after your pain clinic appointments.  ?5. Be prepared:  ?Write down everything that you need to discuss with your healthcare provider and give this information to the admitting nurse. Write down the medications that you will need refilled. Bring your pills and bottles (even the empty ones), to all of  your appointments, except for those where a procedure is scheduled. ?6. No children or pets:  ?Find someone to take care of them. It is not appropriate to bring them in. ?7. Scheduling changes:  ?We request "advanced notification" of any changes or cancellations. ?8. Advanced notification:  ?Defined as a time period of more than 24  hours prior to the originally scheduled appointment. This allows for the appointment to be offered to other patients. ?9. Rescheduling:  ?When a visit is rescheduled, it will require the cancellation of the original appointment. For this reason they both fall within the category of "Cancellations".  ?10. Cancellations:  ?They require advanced notification. Any cancellation less than 24 hours before the  appointment will be recorded as a "No Show". ?11. No Show:  ?Defined as an unkept appointment where the patient failed to notify or declare to the practice their intention or inability to keep the appointment. ? ?Corrective process for repeat offenders:  ?Tardiness: Three (3) episodes of rescheduling due to late arrivals will be recorded as one (1) "No Show". ?Cancellation or reschedule: Three (3) cancellations or rescheduling will be recorded as one (1) "No Show". ?"No Shows": Three (3) "No Shows" within a 12 month period will result in discharge from the practice. ?____________________________________________________________________________________________ Radiofrequency Ablation ?Radiofrequency ablation is a procedure that is performed to relieve pain. The procedure is often used for back, neck, or arm pain. Radiofrequency ablation involves the use of a machine that creates radio waves to make heat. During the procedure, the heat is applied to the nerve that carries the pain signal. The heat damages the nerve and interferes with the pain signal. Pain relief usually starts about 2 weeks after the procedure and lasts for 6 months to 1 year. ?Tell a health care provider about: ?Any allergies you have. ?All medicines you are taking, including vitamins, herbs, eye drops, creams, and over-the-counter medicines. ?Any problems you or family members have had with anesthetic medicines. ?Any bleeding problems you have. ?Any surgeries you have had. ?Any medical conditions you have. ?Whether you are pregnant or may be  pregnant. ?What are the risks? ?Generally, this is a safe procedure. However, problems may occur, including: ?Pain or soreness at the injection site. ?Allergic reaction to medicines given during the procedure. ?Bleeding. ?Infection at the injection site. ?Damage to nerves or blood vessels. ?What happens before the procedure? ?When to stop eating and drinking ?Follow instructions from your health care provider about what you may eat and drink before your procedure. These may include: ?8 hours before the procedure ?Stop eating most foods. Do not eat meat, fried foods, or fatty foods. ?Eat only light foods, such as toast or crackers. ?All liquids are okay except energy drinks and alcohol. ?6 hours before the procedure ?Stop eating. ?Drink only clear liquids, such as water, clear fruit juice, black coffee, plain tea, and sports drinks. ?Do not drink energy drinks or alcohol. ?2 hours before the procedure ?Stop drinking all liquids. ?You may be allowed to take medicine with small sips of water. ?If you do not follow your health care provider's instructions, your procedure may be delayed or canceled. ?Medicines ?Ask your health care provider about: ?Changing or stopping your regular medicines. This is especially important if you are taking diabetes medicines or blood thinners. ?Taking medicines such as aspirin and ibuprofen. These medicines can thin your blood. Do not take these medicines unless your health care provider tells you to take them. ?Taking over-the-counter medicines, vitamins, herbs, and supplements. ?General instructions ?  Ask your health care provider what steps will be taken to help prevent infection. These steps may include: ?Removing hair at the procedure site. ?Washing skin with a germ-killing soap. ?Taking antibiotic medicine. ?If you will be going home right after the procedure, plan to have a responsible adult: ?Take you home from the hospital or clinic. You will not be allowed to drive. ?Care for you  for the time you are told. ?What happens during the procedure? ? ?You will be awake during the procedure. You will need to be able to talk with the health care provider during the procedure. ?An IV will be inser

## 2021-09-16 NOTE — Progress Notes (Addendum)
PROVIDER NOTE: Interpretation of information contained herein should be left to medically-trained personnel. Specific patient instructions are provided elsewhere under "Patient Instructions" section of medical record. This document was created in part using STT-dictation technology, any transcriptional errors that may result from this process are unintentional.  ?Patient: Nathan Dean ?Type: Established ?DOB: Jun 27, 1955 ?MRN: [id number] ?PCP: [facility]  Service: Procedure ?DOS: 09/16/2021 ?Setting: Ambulatory ?Location: Ambulatory outpatient facility ?Delivery: Face-to-face Provider: Gillis Santa, MD ?Specialty: Interventional Pain Management ?Specialty designation: 09 ?Location: Outpatient facility ?Ref. Prov.: [place], [place]   ? ?Primary Reason for Visit: Interventional Pain Management Treatment. ?CC: Shoulder Pain (left) ? ?Procedure:          ? Type: RIGHT Suprascapular nerve block #2  and left shoulder anterior deltoid TPI ? ?Level: Superior to scapular spine, lateral to supraspinatus fossa (Suprascapular notch).  ?Imaging: Fluoroscopic guidance ?Anesthesia: Local anesthesia (1-2% Lidocaine) ?Anxiolysis: None                 ?Sedation: None. ?DOS: 09/16/2021  ?Performed by: Gillis Santa, MD ? ?Purpose: Diagnostic/Therapeutic ?Indications: Shoulder pain, severe enough to impact quality of life and/or function. ?1. Chronic right shoulder pain   ?2. S/P right rotator cuff repair   ?3. Right rotator cuff tear arthropathy   ?4. Chronic pain syndrome   ?5. Localized pain of left shoulder joint   ? ?NAS-11 score:  ? Pre-procedure: 8 /10  ? Post-procedure: 0-No pain/10  ? ?  ?Target: Suprascapular nerve ?Location: midway between the medial border of the scapula and the acromion as it runs through the suprascapular notch. ?Region: Suprascapular, posterior shoulder  ?Approach: Percutaneous  ?Neuroanatomy: The suprascapular nerve is the lateral branch of the superior trunk of the brachial  plexus. It receives nerve fibers that originate in the nerve roots C5 and C6 (and sometimes C4). It is a mixed nerve, meaning that it provides both sensory and motor supply for the suprascapular region. ?Function: The main function of this nerve is to provide motor innervation for two muscles, the supraspinatus and infraspinatus muscles. They are part of the rotator cuff muscles. In addition, the suprascapular nerve provides a sensory supply to the joints of the scapula (glenohumeral and acromioclavicular joints). ?Rationale (medical necessity): procedure needed and proper for the diagnosis and/or treatment of the patient's medical symptoms and needs. ? ?Position / Prep / Materials:  ?Position: Prone ?Materials:  ?Tray: Block ?Needle(s):  ?Type: Spinal  ?Gauge (G): 22  ?Length: 3.5 in.  ?Qty: 1 ?Prep solution: DuraPrep (Iodine Povacrylex [0.7% available iodine] and Isopropyl Alcohol, 74% w/w) ?Prep Area: Entire posterior shoulder area. From upper spine to shoulder proper (upper arm), and from lateral neck to lower tip of shoulder blade.  ? ?Pre-op H&P Assessment:  ?Nathan Dean is a 66 y.o. (year old), male patient, seen today for interventional treatment. He  has a past surgical history that includes Replacement total knee bilateral (2014); Carpal tunnel release (Right, 12/28/2016); Carpal tunnel release (Left); Tonsillectomy; Cataract extraction w/ intraocular lens implant (Bilateral); Back surgery; Back surgery; and Shoulder open rotator cuff repair (Right, 08/07/2019). Nathan Dean has a current medication list which includes the following prescription(s): amlodipine, one touch ultra system kit, bupropion, carvedilol, doxycycline, duloxetine, duloxetine, gabapentin, glucose blood, insulin aspart, insulin pen needle, loratadine, losartan, melatonin, meloxicam, metformin, omeprazole, one touch lancets, rosuvastatin, senna-docusate, and tizanidine, and the following Facility-Administered Medications: dexamethasone,  iohexol, lidocaine, and ropivacaine (pf) 2 mg/ml (0.2%). His primarily concern today is the Shoulder Pain (left) ? ?Initial Vital Signs:  ?  Pulse/HCG Rate: 70ECG Heart Rate: 73 ?Temp: (!) 97.2 ?F (36.2 ?C) ?Resp: 16 ?BP: 110/69 ?SpO2: 98 % ? ?BMI: Estimated body mass index is 39.65 kg/m? as calculated from the following: ?  Height as of this encounter: _0  (2.057 m). ?  Weight as of this encounter: 370 lb (167.8 kg). ? ?Risk Assessment: ?Allergies: Reviewed. He is allergic to penicillins, succinylcholine chloride, keflex [cephalexin], and sulfa antibiotics.  ?Allergy Precautions: None required ?Coagulopathies: Reviewed. None identified.  ?Blood-thinner therapy: None at this time ?Active Infection(s): Reviewed. None identified. Nathan Dean is afebrile ? ?Site Confirmation: Nathan Dean was asked to confirm the procedure and laterality before marking the site ?Procedure checklist: Completed ?Consent: Before the procedure and under the influence of no sedative(s), amnesic(s), or anxiolytics, the patient was informed of the treatment options, risks and possible complications. To fulfill our ethical and legal obligations, as recommended by the American Medical Association's Code of Ethics, I have informed the patient of my clinical impression; the nature and purpose of the treatment or procedure; the risks, benefits, and possible complications of the intervention; the alternatives, including doing nothing; the risk(s) and benefit(s) of the alternative treatment(s) or procedure(s); and the risk(s) and benefit(s) of doing nothing. ?The patient was provided information about the general risks and possible complications associated with the procedure. These may include, but are not limited to: failure to achieve desired goals, infection, bleeding, organ or nerve damage, allergic reactions, paralysis, and death. ?In addition, the patient was informed of those risks and complications associated to the procedure, such as failure  to decrease pain; infection; bleeding; organ or nerve damage with subsequent damage to sensory, motor, and/or autonomic systems, resulting in permanent pain, numbness, and/or weakness of one or several areas of the body; allergic reactions; (i.e.: anaphylactic reaction); and/or death. ?Furthermore, the patient was informed of those risks and complications associated with the medications. These include, but are not limited to: allergic reactions (i.e.: anaphylactic or anaphylactoid reaction(s)); adrenal axis suppression; blood sugar elevation that in diabetics may result in ketoacidosis or comma; water retention that in patients with history of congestive heart failure may result in shortness of breath, pulmonary edema, and decompensation with resultant heart failure; weight gain; swelling or edema; medication-induced neural toxicity; particulate matter embolism and blood vessel occlusion with resultant organ, and/or nervous system infarction; and/or aseptic necrosis of one or more joints. ?Finally, the patient was informed that Medicine is not an exact science; therefore, there is also the possibility of unforeseen or unpredictable risks and/or possible complications that may result in a catastrophic outcome. The patient indicated having understood very clearly. We have given the patient no guarantees and we have made no promises. Enough time was given to the patient to ask questions, all of which were answered to the patient's satisfaction. Nathan Dean has indicated that he wanted to continue with the procedure. ?Attestation: I, the ordering provider, attest that I have discussed with the patient the benefits, risks, side-effects, alternatives, likelihood of achieving goals, and potential problems during recovery for the procedure that I have provided informed consent. ?Date  Time: 09/16/2021 10:40 AM ? ?Pre-Procedure Preparation:  ?Monitoring: As per clinic protocol. Respiration, ETCO2, SpO2, BP, heart rate and  rhythm monitor placed and checked for adequate function ?Safety Precautions: Patient was assessed for positional comfort and pressure points before starting the procedure. ?Time-out: I initiated and conducted

## 2021-09-17 ENCOUNTER — Telehealth: Payer: Self-pay | Admitting: *Deleted

## 2021-09-17 NOTE — Telephone Encounter (Signed)
Attempted to call for post procedure follow-up. Message left. 

## 2021-09-18 ENCOUNTER — Ambulatory Visit: Payer: Medicare Other | Attending: Student | Admitting: Physical Therapy

## 2021-09-18 ENCOUNTER — Ambulatory Visit
Admission: RE | Admit: 2021-09-18 | Discharge: 2021-09-18 | Disposition: A | Discharge status: Home / Self Care | Payer: Medicare Other | Source: Ambulatory Visit | Attending: Neurology | Admitting: Neurology

## 2021-09-18 ENCOUNTER — Ambulatory Visit
Admission: RE | Admit: 2021-09-18 | Discharge: 2021-09-18 | Disposition: A | Discharge status: Home / Self Care | Payer: Medicare Other | Source: Home / Self Care | Attending: Neurology | Admitting: Neurology

## 2021-09-18 DIAGNOSIS — R413 Other amnesia: Secondary | ICD-10-CM | POA: Diagnosis not present

## 2021-09-18 DIAGNOSIS — R262 Difficulty in walking, not elsewhere classified: Secondary | ICD-10-CM | POA: Diagnosis present

## 2021-09-18 DIAGNOSIS — Z982 Presence of cerebrospinal fluid drainage device: Secondary | ICD-10-CM | POA: Insufficient documentation

## 2021-09-18 DIAGNOSIS — R269 Unspecified abnormalities of gait and mobility: Secondary | ICD-10-CM | POA: Diagnosis present

## 2021-09-18 DIAGNOSIS — R296 Repeated falls: Secondary | ICD-10-CM | POA: Diagnosis present

## 2021-09-18 DIAGNOSIS — M6281 Muscle weakness (generalized): Secondary | ICD-10-CM | POA: Insufficient documentation

## 2021-09-18 NOTE — Patient Instructions (Signed)
Access Code: [email or web address] ?URL: [email or web address] ?Date: 09/18/2021 ?Prepared by: Thresa Ross ? ?Exercises ?- Sit to Stand with Arms Crossed  - 1 x daily - 3 x weekly - 3 sets - 10 reps ?- Standing Hip Abduction with Counter Support  - 1 x daily - 3 x weekly - 3 sets - 10 reps ?- Standing Knee Flexion with Counter Support  - 1 x daily - 3 x weekly - 3 sets - 10 reps ?- Mini Squat with Counter Support  - 1 x daily - 3 x weekly - 3 sets - 10 reps ?- Standing Forward Step Taps with Counter Support  - 1 x daily - 3 x weekly - 3 sets - 10 reps ?- Seated Heel Toe Raises  - 1 x daily - 3 x weekly - 2 sets - 15 reps - 5 second  hold ?- Seated Heel Raise  - 1 x daily - 3 x weekly - 2 sets - 15 reps - 5 second hold ?

## 2021-09-18 NOTE — Therapy (Signed)
[facility] ?[place], [place], [address] ?Phone: [phone]   Fax:  [phone] ? ?Physical Therapy Treatment ? ?Patient Details  ?Name: Nathan Dean ?MRN: [id number] ?Date of Birth: 04-Aug-1966 ?Referring Provider (PT): Margart Sickles PA ? ? ?Encounter Date: 09/18/2021 ? ? PT End of Session - 09/18/21 1010   ? ? Visit Number 31   ? Number of Visits 41   ? Date for PT Re-Evaluation 11/18/21   ? Authorization Type 1/10 eval 02/24/21 Auth 06/03/21-08/26/21; Recert= [id number]   ? Progress Note Due on Visit 40   ? PT Start Time [id number]   ? PT Stop Time 1025   ? PT Time Calculation (min) 40 min   ? Equipment Utilized During Treatment Gait belt   ? Activity Tolerance Patient tolerated treatment well;Patient limited by fatigue   ? Behavior During Therapy [facility] for tasks assessed/performed   ? ?  ?  ? ?  ? ? ?Past Medical History:  ?Diagnosis Date  ? Complication of anesthesia   ? 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 Diabetes ([place])   ? GERD (gastroesophageal reflux disease)   ? Hyperlipidemia   ? Hypertension   ? Obesity   ? Sleep apnea   ? ? ?Past Surgical History:  ?Procedure Laterality Date  ? BACK SURGERY    ? in the 80's -herniated disc  ? BACK SURGERY    ? L4 bone spur 1990  ? CARPAL TUNNEL RELEASE Right 12/28/2016  ? Procedure: RIGHT ULNAR AND MEDIAN NEUROPLASTY AT WRIST;  Surgeon: Milly Jakob, MD;  Location: [facility];  Service: Orthopedics;  Laterality: Right;  ? CARPAL TUNNEL RELEASE Left   ? 25 years ago  ? CATARACT EXTRACTION W/ INTRAOCULAR LENS IMPLANT Bilateral   ? REPLACEMENT TOTAL KNEE BILATERAL  2014  ? left 2012 rt 2014  ? SHOULDER OPEN ROTATOR CUFF REPAIR Right 08/07/2019  ? Procedure: ROTATOR CUFF REPAIR SHOULDER OPEN;  Surgeon: Thornton Park, MD;  Location: [facility];  Service: Orthopedics;  Laterality: Right;  ? TONSILLECTOMY    ? ? ?There were no vitals filed for this visit. ? ? Subjective Assessment -  09/18/21 0950   ? ? Subjective Pt reports having a fall when attempting to navigate around his car in the rain last weekend. Pt reports he just had a shot in his right shoulder recently.   ? Patient is accompained by: Family member   ? Pertinent History Patient has seen this therapist in 2019. Was being treated at [facility] clinic but is transferring to this clinic for balance training. PMH includes sleep apnea, HTN, aortic valve disease, GERD, genituourinary malignancy; renal, hydrocephalus s/p shunt placed 03/2020, DVT, Type II DM, obesity, OA, covid, depression, diabetic polyneuropathy, AD, pressure injury of right foot, and falls. Patient had R rotator cuff surgery a year and a half and has not had a good recovery process.  Patient has a history of falling prior to COVID, falls decreased after shunt but still are present. Will see a podiatrist soon for his pressure injury on R foot. Referral is for diabetic polyneuropathy, foot drop R foot, and falling. Patient reports >30 falls in past 6 months.   ? Limitations Sitting;Standing;Walking;House hold activities;Other (comment)   ? How long can you sit comfortably? a few hours   ? How long can you stand comfortably? immediately unstable   ? How long can you walk comfortably? immediately unstable   ? Patient Stated Goals improve leg  strength. more independent, less falls   ? Currently in Pain? Yes   ? Pain Score 7    ? Pain Location Shoulder   ? Pain Orientation Left;Right   ? Pain Descriptors / Indicators Sharp   ? Pain Type Chronic pain   ? Pain Radiating Towards right upper arm   ? Pain Onset More than a month ago   ? Pain Frequency Constant   ? ?  ?  ? ?  ? ? ?Reviewed and updated the HEP in pt instructions  ? ? ?Access Code: [email or web address] ?URL: [email or web address] ?Date: 09/18/2021 ?Prepared by: Rivka Barbara ? ?Exercises ?- Sit to Stand with Arms Crossed  - 1 x daily - 3 x weekly - 3 sets - 10 reps ?- Standing Hip Abduction with Counter Support  -  1 x daily - 3 x weekly - 3 sets - 10 reps ?- Standing Knee Flexion with Counter Support  - 1 x daily - 3 x weekly - 3 sets - 10 reps ?- Mini Squat with Counter Support  - 1 x daily - 3 x weekly - 3 sets - 10 reps ?- Standing Forward Step Taps with Counter Support  - 1 x daily - 3 x weekly - 3 sets - 10 reps ?- Seated Heel Toe Raises  - 1 x daily - 3 x weekly - 2 sets - 15 reps - 5 second  hold ?- Seated Heel Raise  - 1 x daily - 3 x weekly - 2 sets - 15 reps - 5 second hold ? ? ? ? ?Attempted standing hip extension patient had increased fatigue.  We will consider adding this to home program in future sessions as patient is able to tolerate increased repetitions of this activity without significant fatigue or imbalance. ? ? With squat exercise patient not hinging at hips and following this instruction patient had better efficacy with this but will continue from further assessment of this in future sessions.  Patient instructed in durable "medical equipment and he should contact a vendor who specializes in this in order to find out if certain equipment is available in his height.  Patient continuing to have shoulder pain at this time and reports he continues to be limiting in his daily activities.  Patient also instructed and educated in process of home health physical therapy if he were to have shoulder procedure done. ? ? ? ? ? ? ? ? ? ? ? ? ? ? ? ? ? ? ? ? ? ? ? PT Education - 09/18/21 0955   ? ? Education Details HEP technique   ? Person(s) Educated Patient   ? Methods Explanation;Demonstration   ? Comprehension Verbalized understanding;Returned demonstration   ? ?  ?  ? ?  ? ? ? PT Short Term Goals - 06/26/21 0956   ? ?  ? PT SHORT TERM GOAL #1  ? Title Patient will be independent in home exercise program to improve strength/mobility for better functional independence with ADLs.   ? Baseline 10/11 HEP given, 06/03/21 updated HEP provided, 2/10 HEP going well.   ? Time 4   ? Period Weeks   ? Status Achieved   ?  Target Date 07/29/21   ?  ? PT SHORT TERM GOAL #2  ? Title Patient will report no falls in past two weeks indicating improved stability and decreased fall risk.   ? Baseline 10/11; multiple falls 12/2: fall within this week (reports decrease in frequency) 1/18: fall within past  week 2/10 no falls in several weeks, ambulating with cane around the home.   ? Time 4   ? Period Weeks   ? Status Achieved   ? Target Date 03/24/21   ? ?  ?  ? ?  ? ? ? ? PT Long Term Goals - 08/26/21 1048   ? ?  ? PT LONG TERM GOAL #1  ? Title Patient will increase FOTO score to equal to or greater than  58%   to demonstrate statistically significant improvement in mobility and quality of life.   ? Baseline 10/11: 41%, 04/17/21: 53 2/10: 53%; 08/26/2021=55%   ? Time 12   ? Period Weeks   ? Status On-going   ? Target Date 11/18/21   ?  ? PT LONG TERM GOAL #2  ? Title Patient will increase ABC scale score >60% to demonstrate better functional mobility and better confidence with ADLs.    ? Baseline 10/11: 32.5% 12/2:50.1% 06/03/21: 53.4%; 08/26/2021= 67.5%   ? Time 12   ? Period Weeks   ? Status Achieved   ? Target Date 08/26/21   ?  ? PT LONG TERM GOAL #3  ? Title Patient will increase Berg Balance score by > 6 points (21/56)  to demonstrate decreased fall risk during functional activities.   ? Baseline 10/11: 15/56 06/03/21: 26; 08/26/2021= 27/56   ? Time 12   ? Period Weeks   ? Status On-going   ? Target Date 11/18/21   ?  ? PT LONG TERM GOAL #4  ? Title Patient (> 65 years old) will complete five times sit to stand test in < 20 seconds from 24 inch surface indicating an increased LE strength and improved balance.   ? Baseline 10/11: unable to perform from standard height chair, 24 inches 24.52 06/03/21: 22.5sec from 26 in table height 06/26/21: 16.9s from 24 inch table height   ? Time 12   ? Period Weeks   ? Status Achieved   ? Target Date 08/26/21   ?  ? PT LONG TERM GOAL #5  ? Title Patient will increase BLE gross strength to 4+/5 ( excluding  ankle strength)  to improve functional strength for independent gait, increased standing tolerance and increased ADL ability.   ? Baseline 10/11: see note 2/10: not tested; 08/26/2021=B hip flex= 3+/5; B kne

## 2021-09-25 ENCOUNTER — Ambulatory Visit: Payer: Medicare Other | Admitting: Physical Therapy

## 2021-09-28 ENCOUNTER — Ambulatory Visit: Payer: Medicare Other | Admitting: Physical Therapy

## 2021-10-02 ENCOUNTER — Ambulatory Visit: Payer: Medicare Other

## 2021-10-09 ENCOUNTER — Ambulatory Visit: Payer: Medicare Other | Admitting: Physical Therapy

## 2021-10-09 DIAGNOSIS — R269 Unspecified abnormalities of gait and mobility: Secondary | ICD-10-CM | POA: Diagnosis not present

## 2021-10-09 DIAGNOSIS — R296 Repeated falls: Secondary | ICD-10-CM

## 2021-10-09 DIAGNOSIS — M6281 Muscle weakness (generalized): Secondary | ICD-10-CM

## 2021-10-09 DIAGNOSIS — R262 Difficulty in walking, not elsewhere classified: Secondary | ICD-10-CM

## 2021-10-09 NOTE — Therapy (Signed)
[facility] [address] [place], [place], [address] Phone: [phone]   Fax:  [phone]  Physical Therapy Treatment  Patient Details  Name: Nathan MottGregory Dean MRN: [id number] Date of Birth: 12/19/1955 Referring Provider (PT): Vernard GamblesPaich, Katilin GeorgiaPA   Encounter Date: 10/09/2021   PT End of Session - 10/09/21 1021     Visit Number 32    Number of Visits 41    Date for PT Re-Evaluation 11/18/21    Authorization Type 1/10 eval 02/24/21 Auth 06/03/21-08/26/21; Recert= 08/26/2021-11/18/2021    Progress Note Due on Visit 40    PT Start Time 1018    PT Stop Time 1050    PT Time Calculation (min) 32 min    Equipment Utilized During Treatment Gait belt    Activity Tolerance Patient tolerated treatment well;Patient limited by fatigue    Behavior During Therapy WFL for tasks assessed/performed             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    Diabetes (HCC)    GERD (gastroesophageal reflux disease)    Hyperlipidemia    Hypertension    Obesity    Sleep apnea     Past Surgical History:  Procedure Laterality Date   BACK SURGERY     in the 80's -herniated disc   BACK SURGERY     L4 bone spur 1990   CARPAL TUNNEL RELEASE Right 12/28/2016   Procedure: RIGHT ULNAR AND MEDIAN NEUROPLASTY AT WRIST;  Surgeon: Mack Hookhompson, David, MD;  Location: [facility];  Service: Orthopedics;  Laterality: Right;   CARPAL TUNNEL RELEASE Left    25 years ago   CATARACT EXTRACTION W/ INTRAOCULAR LENS IMPLANT Bilateral    REPLACEMENT TOTAL KNEE BILATERAL  2014   left 2012 rt 2014   SHOULDER OPEN ROTATOR CUFF REPAIR Right 08/07/2019   Procedure: ROTATOR CUFF REPAIR SHOULDER OPEN;  Surgeon: Juanell FairlyKrasinski, Kevin, MD;  Location: [facility];  Service: Orthopedics;  Laterality: Right;   TONSILLECTOMY      There were no vitals filed for this visit.   Subjective Assessment -  10/09/21 1020     Subjective Pt reports having car trouble over the past few weeks. He reports his home exercises have been going well.    Patient is accompained by: Family member    Pertinent History Patient has seen this therapist in 2019. [facility] is [facility] to this clinic for balance training. PMH includes sleep apnea, HTN, aortic valve disease, GERD, genituourinary malignancy; renal, hydrocephalus s/p shunt placed 03/2020, DVT, Type II DM, obesity, OA, covid, depression, diabetic polyneuropathy, AD, pressure injury of right foot, and falls. Patient had R rotator cuff surgery a year and a half and has not had a good recovery process.  Patient has a history of falling prior to COVID, falls decreased after shunt but still are present. Will see a podiatrist soon for his pressure injury on R foot. Referral is for diabetic polyneuropathy, foot drop R foot, and falling. Patient reports >30 falls in past 6 months.    Limitations Sitting;Standing;Walking;House hold activities;Other (comment)    How long can you sit comfortably? a few hours    How long can you stand comfortably? immediately unstable    How long can you walk comfortably? immediately unstable    Patient Stated Goals improve leg strength. more independent, less falls    Pain Onset  More than a month ago               Therex  Exercises - Sit to Stand with Arms Crossed - 1 sets - 10 reps - Mini Squat with Counter Support  - 3 sets - 10 reps - Seated Heel Toe Raises  2 sets - 15 reps - 5 second  hold - Seated Heel Raise   2 sets - 15 reps - 5 second hold x 5 #   Pt experiencing some nausea during session and exercises and session limited as a result. Did end session early due to nausea per pt request.    Pt required occasional rest breaks due fatigue, PT was quick to ask when pt appeared to be fatiguing in order to prevent excessive fatigue. Rationale for Evaluation and Treatment  Rehabilitation                           PT Short Term Goals - 06/26/21 0956       PT SHORT TERM GOAL #1   Title Patient will be independent in home exercise program to improve strength/mobility for better functional independence with ADLs.    Baseline 10/11 HEP given, 06/03/21 updated HEP provided, 2/10 HEP going well.    Time 4    Period Weeks    Status Achieved    Target Date 07/29/21      PT SHORT TERM GOAL #2   Title Patient will report no falls in past two weeks indicating improved stability and decreased fall risk.    Baseline 10/11; multiple falls 12/2: fall within this week (reports decrease in frequency) 1/18: fall within past week 2/10 no falls in several weeks, ambulating with cane around the home.    Time 4    Period Weeks    Status Achieved    Target Date 03/24/21               PT Long Term Goals - 08/26/21 1048       PT LONG TERM GOAL #1   Title Patient will increase FOTO score to equal to or greater than  58%   to demonstrate statistically significant improvement in mobility and quality of life.    Baseline 10/11: 41%, 04/17/21: 53 2/10: 53%; 08/26/2021=55%    Time 12    Period Weeks    Status On-going    Target Date 11/18/21      PT LONG TERM GOAL #2   Title Patient will increase ABC scale score >60% to demonstrate better functional mobility and better confidence with ADLs.     Baseline 10/11: 32.5% 12/2:50.1% 06/03/21: 53.4%; 08/26/2021= 67.5%    Time 12    Period Weeks    Status Achieved    Target Date 08/26/21      PT LONG TERM GOAL #3   Title Patient will increase Berg Balance score by > 6 points (21/56)  to demonstrate decreased fall risk during functional activities.    Baseline 10/11: 15/56 06/03/21: 26; 08/26/2021= 27/56    Time 12    Period Weeks    Status On-going    Target Date 11/18/21      PT LONG TERM GOAL #4   Title Patient (> 76 years old) will complete five times sit to stand test in < 20 seconds from 24 inch  surface indicating an increased LE strength and improved balance.    Baseline 10/11: unable to perform from standard height chair,  24 inches 24.52 06/03/21: 22.5sec from 26 in table height 06/26/21: 16.9s from 24 inch table height    Time 12    Period Weeks    Status Achieved    Target Date 08/26/21      PT LONG TERM GOAL #5   Title Patient will increase BLE gross strength to 4+/5 ( excluding ankle strength)  to improve functional strength for independent gait, increased standing tolerance and increased ADL ability.    Baseline 10/11: see note 2/10: not tested; 08/26/2021=B hip flex= 3+/5; B knee ext= 4/5; B knee flex= 4/5    Time 12    Period Weeks    Status On-going    Target Date 11/18/21      Additional Long Term Goals   Additional Long Term Goals Yes      PT LONG TERM GOAL #6   Title Patient will perform sit to stand from surfaces 24 in or higher with left UE support with modified independence to improve functional strength with household transfers required for potential upcoming right shoulder surgery.    Baseline 08/26/2021= Patient requiring B UE Support with all transfers and reports difficulty rising from lift chair at home and will need sufficient strength if he has R  Shoulder surgery.    Time 12    Period Weeks    Status New    Target Date 11/18/21      PT LONG TERM GOAL #7   Title Patient will ambulate > 100 feet with hemiwalker on left on level surfaces for improved functional mobility and to prepare for potential Right shoulder Surgery.    Baseline 08/26/2021= Patient requires a 4WW for all mobility due to imbalance and weakness and needs to be off walker prior to Ortho Surgeon to clear him for potential Right shoulder surgery.    Time 12    Period Weeks    Status New    Target Date 11/18/21      PT LONG TERM GOAL #8   Title Patient will perform a floor to sit transfer with Min assist with good and safe body mechanics to decrease risk of injury in the event of a fall.     Baseline 08/26/2021= Paitent is dependent and inconsistent in his ability to get up from a fall with recurrent fall history.    Time 12    Period Weeks    Status New    Target Date 11/18/21                   Plan - 10/09/21 1022     Clinical Impression Statement Patient presents today with good motivation for completion of physical therapy activities Patient also instructed in seated heel raises and toe raises with prolonged holds in order to improve his ankle strength as previous home exercise program he has been working on these in standing he is unable to achieve adequate range of motion for proper strengthening. Patient also instructed in hip extension exercise this is very fatiguing and also instructed in proper form with his squatting in order to prevent excessive stress on knees and also allow for glutes activation as assistance with these activities. Patient will continue to benefit from skilled physical therapy intervention in order to continue to progress his lower extremity strength, reduce fall risk, improve his mobility, and improve his quality of life.s    Personal Factors and Comorbidities Age;Comorbidity 3+;Finances;Fitness;Past/Current Experience;Social Background;Time since onset of injury/illness/exacerbation;Transportation    Comorbidities leep apnea, HTN, aortic valve disease,  GERD, genituourinary malignancy; renal, hydrocephalus s/p shunt placed 03/2020, DVT, Type II DM, obesity, OA, covid, depression, diabetic polyneuropathy, AD, pressure injury of right foot, and falls    Examination-Activity Limitations Bathing;Bed Mobility;Bend;Caring for Others;Carry;Dressing;Hygiene/Grooming;Stairs;Squat;Sit;Reach Overhead;Locomotion Level;Lift;Stand;Toileting;Transfers    Examination-Participation Restrictions Cleaning;Community Activity;Driving;Interpersonal Relationship;Laundry;Shop;Personal Finances;Occupation;Meal Prep;Volunteer;Yard Work    Health visitor Evolving/Moderate complexity    Rehab Potential Fair    PT Frequency 1x / week    PT Duration 12 weeks    PT Treatment/Interventions ADLs/Self Care Home Management;Aquatic Therapy;Biofeedback;Electrical Stimulation;Traction;Functional mobility training;Stair training;Gait training;DME Instruction;Ultrasound;Moist Heat;Therapeutic activities;Therapeutic exercise;Neuromuscular re-education;Balance training;Manual techniques;Orthotic Fit/Training;Patient/family education;Compression bandaging;Passive range of motion;Dry needling;Vestibular;Taping;Vasopneumatic Device;Energy conservation;Manual lymph drainage;Canalith Repostioning;Visual/perceptual remediation/compensation    PT Next Visit Plan Continue with progressive LE strengthening and mobility training as appropriate. Floor to sit transfers. Gait training with least restrictive AD.    PT Home Exercise Plan No changes    Consulted and Agree with Plan of Care Patient             Patient will benefit from skilled therapeutic intervention in order to improve the following deficits and impairments:  Abnormal gait, Decreased activity tolerance, Decreased balance, Decreased knowledge of precautions, Decreased endurance, Decreased coordination, Decreased mobility, Decreased range of motion, Difficulty walking, Decreased strength, Hypomobility, Impaired flexibility, Impaired perceived functional ability, Impaired sensation, Postural dysfunction, Improper body mechanics, Cardiopulmonary status limiting activity, Impaired UE functional use, Obesity, Pain  Visit Diagnosis: No diagnosis found.     Problem List Patient Active Problem List   Diagnosis Date Noted   Chronic right shoulder pain 07/30/2021   Cervical facet joint syndrome 07/30/2021   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/2021   Chronic pain syndrome 07/30/2021   History of bilateral knee replacement 07/30/2021   Bilateral chronic knee pain 07/30/2021   Post laminectomy  syndrome 07/30/2021   Acute diastolic CHF (congestive heart failure) (HCC) 08/17/2019   Edema    Acute metabolic encephalopathy 08/12/2019   Acute pulmonary edema (HCC) 08/12/2019   Acute cor pulmonale (HCC) 08/11/2019   Oxygen desaturation 08/08/2019   S/P right rotator cuff repair 08/07/2019   Hypoxia 10/22/2018   Infection of right prepatellar bursa 10/18/2018   Sepsis (HCC) 10/18/2018   Chest congestion 07/20/2017   Viral illness 07/20/2017   Lower respiratory infection 07/12/2017   Laryngitis, acute 07/12/2017   Cough 07/12/2017   Pleurisy 07/12/2017   Depression 06/03/2015   HTN (hypertension) 01/02/2015   Hyperlipidemia 01/02/2015   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 (HCC) 12/23/2014   Sleep apnea 09/21/2010   Arthritis, degenerative 02/17/2009   Hereditary and idiopathic peripheral neuropathy 11/21/2008   AD (atopic dermatitis) 07/08/2008   Acid reflux 04/05/2007    Norman Herrlich, PT 10/09/2021, 11:02 AM  [place] MAIN [facility] SERVICES [address] [place], [place], [address] Phone: [phone]   Fax:  [phone]  Name: Nathan Dean MRN: [id number] Date of Birth: [birth year]

## 2021-10-14 ENCOUNTER — Ambulatory Visit
Payer: Medicare Other | Attending: Student in an Organized Health Care Education/Training Program | Admitting: Student in an Organized Health Care Education/Training Program

## 2021-10-14 ENCOUNTER — Ambulatory Visit: Payer: Medicare Other | Admitting: Physical Therapy

## 2021-10-14 ENCOUNTER — Encounter: Payer: Self-pay | Admitting: Student in an Organized Health Care Education/Training Program

## 2021-10-14 DIAGNOSIS — R262 Difficulty in walking, not elsewhere classified: Secondary | ICD-10-CM

## 2021-10-14 DIAGNOSIS — R296 Repeated falls: Secondary | ICD-10-CM

## 2021-10-14 DIAGNOSIS — M75101 Unspecified rotator cuff tear or rupture of right shoulder, not specified as traumatic: Secondary | ICD-10-CM

## 2021-10-14 DIAGNOSIS — Z9889 Other specified postprocedural states: Secondary | ICD-10-CM

## 2021-10-14 DIAGNOSIS — G894 Chronic pain syndrome: Secondary | ICD-10-CM | POA: Diagnosis not present

## 2021-10-14 DIAGNOSIS — M12811 Other specific arthropathies, not elsewhere classified, right shoulder: Secondary | ICD-10-CM

## 2021-10-14 DIAGNOSIS — R269 Unspecified abnormalities of gait and mobility: Secondary | ICD-10-CM

## 2021-10-14 DIAGNOSIS — M25511 Pain in right shoulder: Secondary | ICD-10-CM | POA: Diagnosis not present

## 2021-10-14 DIAGNOSIS — G8929 Other chronic pain: Secondary | ICD-10-CM

## 2021-10-14 DIAGNOSIS — M6281 Muscle weakness (generalized): Secondary | ICD-10-CM

## 2021-10-14 NOTE — Therapy (Signed)
[facility] [address] [place], [place], [address] Phone: [phone]   Fax:  [phone]  Physical Therapy Treatment  Patient Details  Name: Nathan MottGregory Dean MRN: [id number] Date of Birth: 08/30/1955 Referring Provider (PT): Vernard GamblesPaich, Katilin GeorgiaPA   Encounter Date: 10/14/2021   PT End of Session - 10/14/21 1154     Visit Number 33    Number of Visits 41    Date for PT Re-Evaluation 11/18/21    Authorization Type 1/10 eval 02/24/21 Auth 06/03/21-08/26/21; Recert= 08/26/2021-11/18/2021    Progress Note Due on Visit 40    PT Start Time 1148    PT Stop Time 1220    PT Time Calculation (min) 32 min    Equipment Utilized During Treatment Gait belt    Activity Tolerance Patient tolerated treatment well;Patient limited by fatigue    Behavior During Therapy WFL for tasks assessed/performed             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    Diabetes (HCC)    GERD (gastroesophageal reflux disease)    Hyperlipidemia    Hypertension    Obesity    Sleep apnea     Past Surgical History:  Procedure Laterality Date   BACK SURGERY     in the 80's -herniated disc   BACK SURGERY     L4 bone spur 1990   CARPAL TUNNEL RELEASE Right 12/28/2016   Procedure: RIGHT ULNAR AND MEDIAN NEUROPLASTY AT WRIST;  Surgeon: Mack Hookhompson, David, MD;  Location: [facility];  Service: Orthopedics;  Laterality: Right;   CARPAL TUNNEL RELEASE Left    25 years ago   CATARACT EXTRACTION W/ INTRAOCULAR LENS IMPLANT Bilateral    REPLACEMENT TOTAL KNEE BILATERAL  2014   left 2012 rt 2014   SHOULDER OPEN ROTATOR CUFF REPAIR Right 08/07/2019   Procedure: ROTATOR CUFF REPAIR SHOULDER OPEN;  Surgeon: Juanell FairlyKrasinski, Kevin, MD;  Location: [facility];  Service: Orthopedics;  Laterality: Right;   TONSILLECTOMY      There were no vitals filed for this visit.   Subjective Assessment -  10/14/21 1146     Subjective Pt reports having a fall on Monday when he was attempting to get up off of his chair. He has appointment with foot doctor for evaluation of this issue. Pt reports continued shouder pain and feels like he the injection he had is wearing off.    Patient is accompained by: Family member    Pertinent History Patient has seen this therapist in 2019. [facility] is [facility] to this clinic for balance training. PMH includes sleep apnea, HTN, aortic valve disease, GERD, genituourinary malignancy; renal, hydrocephalus s/p shunt placed 03/2020, DVT, Type II DM, obesity, OA, covid, depression, diabetic polyneuropathy, AD, pressure injury of right foot, and falls. Patient had R rotator cuff surgery a year and a half and has not had a good recovery process.  Patient has a history of falling prior to COVID, falls decreased after shunt but still are present. Will see a podiatrist soon for his pressure injury on R foot. Referral is for diabetic polyneuropathy, foot drop R foot, and falling. Patient reports >30 falls in past 6 months.    Limitations Sitting;Standing;Walking;House hold activities;Other (comment)    How long can you sit comfortably? a few hours    How long can you stand comfortably? immediately unstable  How long can you walk comfortably? immediately unstable    Patient Stated Goals improve leg strength. more independent, less falls    Pain Onset More than a month ago                  Reviewed and updated the HEP in pt instructions     - Mini Squat with Counter Support  - 1 x daily - 3 x weekly - 2 sets - 5 reps  -cues for gluteal muscle activation - Standing Forward Step Taps with Counter Support  - 1 x daily - 3 x weekly - 3 sets - 10 reps - Seated Heel Toe Raises  - 1 x daily - 3 x weekly - 2 sets - 15 reps - 5 second  hold - Seated Heel Raise  - 1 x daily - 3 x weekly - 2 sets - 15 reps - 5 second hold Standing hip  extension:     Pt required occasional rest breaks due fatigue, PT was quick to ask when pt appeared to be fatiguing in order to prevent excessive fatigue. Pt educated throughout session about proper posture and technique with exercises. Improved exercise technique, movement at target joints, use of target muscles after min to mod verbal, visual, tactile cues. Rationale for Evaluation and Treatment Rehabilitation                            PT Education - 10/14/21 1152     Education Details Exercise technique    Person(s) Educated Patient    Methods Explanation;Demonstration    Comprehension Verbalized understanding;Returned demonstration              PT Short Term Goals - 06/26/21 0956       PT SHORT TERM GOAL #1   Title Patient will be independent in home exercise program to improve strength/mobility for better functional independence with ADLs.    Baseline 10/11 HEP given, 06/03/21 updated HEP provided, 2/10 HEP going well.    Time 4    Period Weeks    Status Achieved    Target Date 07/29/21      PT SHORT TERM GOAL #2   Title Patient will report no falls in past two weeks indicating improved stability and decreased fall risk.    Baseline 10/11; multiple falls 12/2: fall within this week (reports decrease in frequency) 1/18: fall within past week 2/10 no falls in several weeks, ambulating with cane around the home.    Time 4    Period Weeks    Status Achieved    Target Date 03/24/21               PT Long Term Goals - 08/26/21 1048       PT LONG TERM GOAL #1   Title Patient will increase FOTO score to equal to or greater than  58%   to demonstrate statistically significant improvement in mobility and quality of life.    Baseline 10/11: 41%, 04/17/21: 53 2/10: 53%; 08/26/2021=55%    Time 12    Period Weeks    Status On-going    Target Date 11/18/21      PT LONG TERM GOAL #2   Title Patient will increase ABC scale score >60% to demonstrate  better functional mobility and better confidence with ADLs.     Baseline 10/11: 32.5% 12/2:50.1% 06/03/21: 53.4%; 08/26/2021= 67.5%    Time 12    Period Weeks  Status Achieved    Target Date 08/26/21      PT LONG TERM GOAL #3   Title Patient will increase Berg Balance score by > 6 points (21/56)  to demonstrate decreased fall risk during functional activities.    Baseline 10/11: 15/56 06/03/21: 26; 08/26/2021= 27/56    Time 12    Period Weeks    Status On-going    Target Date 11/18/21      PT LONG TERM GOAL #4   Title Patient (> 92 years old) will complete five times sit to stand test in < 20 seconds from 24 inch surface indicating an increased LE strength and improved balance.    Baseline 10/11: unable to perform from standard height chair, 24 inches 24.52 06/03/21: 22.5sec from 26 in table height 06/26/21: 16.9s from 24 inch table height    Time 12    Period Weeks    Status Achieved    Target Date 08/26/21      PT LONG TERM GOAL #5   Title Patient will increase BLE gross strength to 4+/5 ( excluding ankle strength)  to improve functional strength for independent gait, increased standing tolerance and increased ADL ability.    Baseline 10/11: see note 2/10: not tested; 08/26/2021=B hip flex= 3+/5; B knee ext= 4/5; B knee flex= 4/5    Time 12    Period Weeks    Status On-going    Target Date 11/18/21      Additional Long Term Goals   Additional Long Term Goals Yes      PT LONG TERM GOAL #6   Title Patient will perform sit to stand from surfaces 24 in or higher with left UE support with modified independence to improve functional strength with household transfers required for potential upcoming right shoulder surgery.    Baseline 08/26/2021= Patient requiring B UE Support with all transfers and reports difficulty rising from lift chair at home and will need sufficient strength if he has R  Shoulder surgery.    Time 12    Period Weeks    Status New    Target Date 11/18/21      PT  LONG TERM GOAL #7   Title Patient will ambulate > 100 feet with hemiwalker on left on level surfaces for improved functional mobility and to prepare for potential Right shoulder Surgery.    Baseline 08/26/2021= Patient requires a 4WW for all mobility due to imbalance and weakness and needs to be off walker prior to Ortho Surgeon to clear him for potential Right shoulder surgery.    Time 12    Period Weeks    Status New    Target Date 11/18/21      PT LONG TERM GOAL #8   Title Patient will perform a floor to sit transfer with Min assist with good and safe body mechanics to decrease risk of injury in the event of a fall.    Baseline 08/26/2021= Paitent is dependent and inconsistent in his ability to get up from a fall with recurrent fall history.    Time 12    Period Weeks    Status New    Target Date 11/18/21                   Plan - 10/14/21 1155     Clinical Impression Statement Patient demonstrated improved tolerance for therapy exercises today with no incidence of nausea.  Patient was progressed with his home exercise program but requires significant cues for  proper hip extension strengthening activities.  Patient reports significant fatigue in his glutes of these activities and patient was instructed she feels muscle activation was completing these at home.  Patient making fair progress at this time and will continue to benefit from skilled physical therapy interventions in order to improve his lower extremity strength, endurance, reduce his risk of falls, improve his quality of life.    Personal Factors and Comorbidities Age;Comorbidity 3+;Finances;Fitness;Past/Current Experience;Social Background;Time since onset of injury/illness/exacerbation;Transportation    Comorbidities leep apnea, HTN, aortic valve disease, GERD, genituourinary malignancy; renal, hydrocephalus s/p shunt placed 03/2020, DVT, Type II DM, obesity, OA, covid, depression, diabetic polyneuropathy, AD, pressure  injury of right foot, and falls    Examination-Activity Limitations Bathing;Bed Mobility;Bend;Caring for Others;Carry;Dressing;Hygiene/Grooming;Stairs;Squat;Sit;Reach Overhead;Locomotion Level;Lift;Stand;Toileting;Transfers    Examination-Participation Restrictions Cleaning;Community Activity;Driving;Interpersonal Relationship;Laundry;Shop;Personal Finances;Occupation;Meal Prep;Volunteer;Yard Work    Conservation officer, historic buildings Evolving/Moderate complexity    Rehab Potential Fair    PT Frequency 1x / week    PT Duration 12 weeks    PT Treatment/Interventions ADLs/Self Care Home Management;Aquatic Therapy;Biofeedback;Electrical Stimulation;Traction;Functional mobility training;Stair training;Gait training;DME Instruction;Ultrasound;Moist Heat;Therapeutic activities;Therapeutic exercise;Neuromuscular re-education;Balance training;Manual techniques;Orthotic Fit/Training;Patient/family education;Compression bandaging;Passive range of motion;Dry needling;Vestibular;Taping;Vasopneumatic Device;Energy conservation;Manual lymph drainage;Canalith Repostioning;Visual/perceptual remediation/compensation    PT Next Visit Plan Continue with progressive LE strengthening and mobility training as appropriate. Floor to sit transfers. Gait training with least restrictive AD.    PT Home Exercise Plan No changes    Consulted and Agree with Plan of Care Patient             Patient will benefit from skilled therapeutic intervention in order to improve the following deficits and impairments:  Abnormal gait, Decreased activity tolerance, Decreased balance, Decreased knowledge of precautions, Decreased endurance, Decreased coordination, Decreased mobility, Decreased range of motion, Difficulty walking, Decreased strength, Hypomobility, Impaired flexibility, Impaired perceived functional ability, Impaired sensation, Postural dysfunction, Improper body mechanics, Cardiopulmonary status limiting activity, Impaired UE  functional use, Obesity, Pain  Visit Diagnosis: Abnormality of gait and mobility  Difficulty in walking, not elsewhere classified  Muscle weakness (generalized)  Repeated falls     Problem List Patient Active Problem List   Diagnosis Date Noted   Chronic right shoulder pain 07/30/2021   Cervical facet joint syndrome 07/30/2021   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/2021   Chronic pain syndrome 07/30/2021   History of bilateral knee replacement 07/30/2021   Bilateral chronic knee pain 07/30/2021   Post laminectomy syndrome 07/30/2021   Acute diastolic CHF (congestive heart failure) (HCC) 08/17/2019   Edema    Acute metabolic encephalopathy 08/12/2019   Acute pulmonary edema (HCC) 08/12/2019   Acute cor pulmonale (HCC) 08/11/2019   Oxygen desaturation 08/08/2019   S/P right rotator cuff repair 08/07/2019   Hypoxia 10/22/2018   Infection of right prepatellar bursa 10/18/2018   Sepsis (HCC) 10/18/2018   Chest congestion 07/20/2017   Viral illness 07/20/2017   Lower respiratory infection 07/12/2017   Laryngitis, acute 07/12/2017   Cough 07/12/2017   Pleurisy 07/12/2017   Depression 06/03/2015   HTN (hypertension) 01/02/2015   Hyperlipidemia 01/02/2015   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 [place], [place], [address] Phone: [phone]   Fax:  [phone]  Name: Nathan Dean MRN: [id number] Date of Birth: [birth year]

## 2021-10-14 NOTE — Patient Instructions (Signed)
Access Code: [facility] URL: [email or web address] Date: 10/14/2021 Prepared by: Rivka Barbara  Exercises - Sit to Stand with Arms Crossed  - 1 x daily - 3 x weekly - 3 sets - 10 reps - Standing Hip Abduction with Counter Support  - 1 x daily - 3 x weekly - 3 sets - 10 reps - Standing Knee Flexion with Counter Support  - 1 x daily - 3 x weekly - 3 sets - 10 reps - Mini Squat with Counter Support  - 1 x daily - 3 x weekly - 3 sets - 10 reps - Standing Forward Step Taps with Counter Support  - 1 x daily - 3 x weekly - 3 sets - 10 reps - Standing Hip Extension with Counter Support  - 1 x daily - 7 x weekly - 3 sets - 10 reps - Seated Heel Toe Raises  - 1 x daily - 3 x weekly - 2 sets - 15 reps - 5 second  hold - Seated Heel Raise  - 1 x daily - 3 x weekly - 2 sets - 15 reps - 5 second hold

## 2021-10-14 NOTE — Progress Notes (Signed)
Patient: Nathan Dean  Service Category: E/M  Provider: Gillis Santa, MD  DOB: 09-10-1955  DOS: 10/14/2021  Location: Office  MRN: [id number]  Setting: Ambulatory outpatient  Referring Provider: Emily Filbert*  Type: Established Patient  Specialty: Interventional Pain Management  PCP: [facility]  Location: Remote location  Delivery: TeleHealth     Virtual Encounter - Pain Management PROVIDER NOTE: Information contained herein reflects review and annotations entered in association with encounter. Interpretation of such information and data should be left to medically-trained personnel. Information provided to patient can be located elsewhere in the medical record under "Patient Instructions". Document created using STT-dictation technology, any transcriptional errors that may result from process are unintentional.    Contact & Pharmacy Preferred: [phone] Home: [phone] (home) Mobile: [phone] (mobile) E-mail: [email or web address]  [id number] [place] - [address] [id number]: [phone]: [phone]  Pre-screening  Nathan Dean "in-person" vs "virtual" encounter. He indicated preferring virtual for this encounter.   Reason COVID-19*  Social distancing based on CDC and AMA recommendations.   I contacted Nathan Dean 10/14/2021 via telephone.      I clearly identified myself as BGillis Santa MD. I verified that I was speaking with the correct person using two identifiers (Name: Nathan Dean and date of birth: [birth year].  Consent I sought verbal advanced consent from Nathan Millardfor virtual visit interactions. I informed Mr. SRichartof possible security and privacy concerns, risks, and limitations associated with providing "not-in-person" medical evaluation and management services. I also informed Nathan Dean the availability of "in-person" appointments. Finally, I informed him  that there would be a charge for the virtual visit and that he could be  personally, fully or partially, financially responsible for it. Nathan Dean understanding and agreed to proceed.   Historic Elements   Mr. GTraevon Meiringis a 66y.o. year old, male patient evaluated today after our last contact on 09/16/2021. Nathan Dean has a past medical history of Complication of anesthesia, Diabetes ([place], GERD (gastroesophageal reflux disease), Hyperlipidemia, Hypertension, Obesity, and Sleep apnea. He also  has a past surgical history that includes Replacement total knee bilateral (2014); Carpal tunnel release (Right, 12/28/2016); Carpal tunnel release (Left); Tonsillectomy; Cataract extraction w/ intraocular lens implant (Bilateral); Back surgery; Back surgery; and Shoulder open rotator cuff repair (Right, 08/07/2019). Mr. SLeightonhas a current medication list which includes the following prescription(s): amlodipine, one touch ultra system kit, bupropion, carvedilol, doxycycline, duloxetine, duloxetine, gabapentin, glucose blood, insulin aspart, insulin pen needle, loratadine, losartan, melatonin, meloxicam, metformin, omeprazole, one touch lancets, rosuvastatin, senna-docusate, and tizanidine. He  reports that he quit smoking about 25 years ago. His smoking use included cigarettes. He has a 40.00 pack-year smoking history. He has never used smokeless tobacco. He reports current alcohol use. He reports that he does not use drugs. Nathan Dean allergic to penicillins, succinylcholine chloride, keflex [cephalexin], and sulfa antibiotics.   HPI  Today, he is being contacted for a post-procedure assessment.   Post-procedure evaluation   Type: RIGHT Suprascapular nerve block #2  and left shoulder anterior deltoid TPI  Level: Superior to scapular spine, lateral to supraspinatus fossa (Suprascapular notch).  Imaging: Fluoroscopic guidance Anesthesia: Local anesthesia (1-2% Lidocaine) Anxiolysis: None                  Sedation: None. DOS: 09/16/2021  Performed by: BGillis Santa MD  Purpose: Diagnostic/Therapeutic Indications: Shoulder pain, severe enough  to impact quality of life and/or function. 1. Chronic right shoulder pain   2. S/P right rotator cuff repair   3. Right rotator cuff tear arthropathy   4. Chronic pain syndrome   5. Localized pain of left shoulder joint    NAS-11 score:   Pre-procedure: 8 /10   Post-procedure: 0-No pain/10      Effectiveness:  Initial hour after procedure: 100 %  Subsequent 4-6 hours post-procedure: 100 %  Analgesia past initial 6 hours: 80 % (lasting 1 month)  Ongoing improvement:  Analgesic:  50-60%, however pain is returning Function: Nathan Dean reports improvement in function ROM: Back to baseline   Laboratory Chemistry Profile   Renal Lab Results  Component Value Date   BUN 26 (H) 01/18/2020   CREATININE 0.82 01/18/2020   BCR 28 (H) 07/20/2017   GFRAA >60 01/18/2020   GFRNONAA >60 01/18/2020    Hepatic Lab Results  Component Value Date   AST 22 08/11/2019   ALT 18 08/11/2019   ALBUMIN 3.5 08/11/2019   ALKPHOS 68 08/11/2019    Electrolytes Lab Results  Component Value Date   NA 137 01/18/2020   K 4.0 01/18/2020   CL 101 01/18/2020   CALCIUM 9.3 01/18/2020   MG 2.3 08/17/2019    Bone No results found for: VD25OH, VD125OH2TOT, BC4888BV6, XI5038UE2, 25OHVITD1, 25OHVITD2, 25OHVITD3, TESTOFREE, TESTOSTERONE  Inflammation (CRP: Acute Phase) (ESR: Chronic Phase) Lab Results  Component Value Date   ESRSEDRATE 77 (H) 08/16/2019   LATICACIDVEN 1.6 08/11/2019         Note: Above Lab results reviewed.  Imaging  DG Abd 1 View CLINICAL DATA:  Follow-up VP shunt  EXAM: ABDOMEN - 1 VIEW  COMPARISON:  None Available.  FINDINGS: Bowel gas pattern is unremarkable without evidence of ileus, obstruction or free air. No abnormal calcifications. Ordinary mild lower lumbar degenerative changes. Shunt tube enters the  abdomen, takes a loop in the right upper quadrant and has its tip in the right side of the pelvis. No evidence of kink, discontinuity or loculated fluid collection.  IMPRESSION: No unexpected finding relating to the shunt in the abdominal region.  Electronically Signed   By: Nelson Chimes M.D.   On: 09/18/2021 10:46 DG Chest 1 View CLINICAL DATA:  VP shunt follow-up  EXAM: CHEST  1 VIEW  COMPARISON:  08/11/2019  FINDINGS: Heart and mediastinal shadows are normal. The lungs are clear. No pleural effusion. No abnormal bone finding. VP shunt appears normal in its course over the right chest.  IMPRESSION: No active disease.  No unexpected shunt finding.  Electronically Signed   By: Nelson Chimes M.D.   On: 09/18/2021 10:44 DG Cervical Spine 1 View CLINICAL DATA:  Follow-up VP shunt  EXAM: DG CERVICAL SPINE - 1 VIEW  COMPARISON:  None Available.  FINDINGS: Shunt appears normal in its course through the right neck.  IMPRESSION: No unexpected finding.  Electronically Signed   By: Nelson Chimes M.D.   On: 09/18/2021 10:43 DG Skull 1-3 Views CLINICAL DATA:  Follow-up VP shunt  EXAM: SKULL - 1-3 VIEW  COMPARISON:  09/13/2020  FINDINGS: VP shunt in place from a right frontal approach. No evidence of discontinuity or other detectable abnormal finding.  IMPRESSION: No unexpected finding.  Electronically Signed   By: Nelson Chimes M.D.   On: 09/18/2021 10:43 CT HEAD WO CONTRAST (5MM) CLINICAL DATA:  Loss of memory a. Follow-up VP shunt.  EXAM: CT HEAD WITHOUT CONTRAST  TECHNIQUE: Contiguous axial images were  obtained from the base of the skull through the vertex without intravenous contrast.  RADIATION DOSE REDUCTION: This exam was performed according to the departmental dose-optimization program which includes automated exposure control, adjustment of the mA and/or kV according to patient size and/or use of iterative reconstruction  technique.  COMPARISON:  09/13/2020  FINDINGS: Brain: No change. VP shunt in place from a right frontal approach with its tip in the frontal horn of the right lateral ventricle. Chronic ventriculomegaly which is stable. Chronic small-vessel ischemic changes of the cerebral hemispheric white matter which is stable. No evidence of mass, hemorrhage or extra-axial collection.  Vascular: No abnormal vascular finding.  Skull: Normal except for the burr hole for VP shunt placement.  Sinuses/Orbits: Clear/normal  Other: None  IMPRESSION: No change since last year. VP shunt in place from a right frontal approach with the tip in the frontal horn of the right lateral ventricle. Stable chronic ventriculomegaly.  Electronically Signed   By: Nelson Chimes M.D.   On: 09/18/2021 10:42  Assessment  The primary encounter diagnosis was Chronic right shoulder pain. Diagnoses of S/P right rotator cuff repair, Right rotator cuff tear arthropathy, and Chronic pain syndrome were also pertinent to this visit.  Plan of Care   Good albeit short-term, response with 2 sets of right suprascapular nerve blocks.  Postprocedural details from his previous 1 are recorded above.  We discussed neck steps which could include radiofrequency ablation of the right suprascapular nerve versus peripheral nerve stimulation of the right suprascapular nerve.  Risks and benefits of both procedures were discussed with patient in detail.  He would like to proceed with a right suprascapular nerve radiofrequency ablation.  He also found benefit with his left anterior deltoid trigger point injection that he would like to repeat.  Orders:  Orders Placed This Encounter  Procedures   Radiofrequency Shoulder Joint    For shoulder pain.    Standing Status:   Future    Standing Expiration Date:   10/15/2022    Scheduling Instructions:     Procedure: Suprascapular Nerve Radiofrequency Ablation     Laterality: RIGHT     Level(s):  Suprascapular nerve     Sedation: IV Versed     Scheduling Timeframe: As soon as pre-approved    Order Specific Question:   Where will this procedure be performed?    Answer:   [facility]   TRIGGER POINT INJECTION    Standing Status:   Future    Standing Expiration Date:   01/14/2022    Scheduling Instructions:     Left shoulder deltoid    Order Specific Question:   Where will this procedure be performed?    Answer:   [facility]   Follow-up plan:   Return in about 1 week (around 10/21/2021) for R SSN RFA, in clinic (PO Valium).     Right SSNB #1 08/10/21, #2 09/16/21   Recent Visits Date Type Provider Dept  09/16/21 Procedure visit Gillis Santa, MD [facility]  09/07/21 Office Visit Gillis Santa, MD [facility]  08/10/21 Procedure visit Gillis Santa, MD [facility]  07/30/21 Office Visit Gillis Santa, MD [facility]  Showing recent visits within past 90 days and meeting all other requirements Today's Visits Date Type Provider Dept  10/14/21 Office Visit Gillis Santa, MD [facility]  Showing today's visits and meeting all other requirements Future Appointments No visits were found meeting these conditions. Showing future appointments within next 90  days and meeting all other requirements  I discussed the assessment and treatment plan with the patient. The patient was provided an opportunity to ask questions and all were answered. The patient agreed with the plan and demonstrated an understanding of the instructions.  Patient advised to call back or seek an in-person evaluation if the symptoms or condition worsens.  Duration of encounter: 50mnutes.  Note by: BGillis Santa MD Date: 10/14/2021; Time: 3:27 PM

## 2021-10-20 ENCOUNTER — Ambulatory Visit (INDEPENDENT_AMBULATORY_CARE_PROVIDER_SITE_OTHER): Payer: Medicare Other

## 2021-10-20 ENCOUNTER — Ambulatory Visit: Payer: Medicare Other

## 2021-10-20 ENCOUNTER — Ambulatory Visit (INDEPENDENT_AMBULATORY_CARE_PROVIDER_SITE_OTHER): Payer: Medicare Other | Admitting: Podiatry

## 2021-10-20 DIAGNOSIS — D490 Neoplasm of unspecified behavior of digestive system: Secondary | ICD-10-CM

## 2021-10-20 DIAGNOSIS — M779 Enthesopathy, unspecified: Secondary | ICD-10-CM

## 2021-10-20 HISTORY — DX: Neoplasm of unspecified behavior of digestive system: D49.0

## 2021-10-20 NOTE — Progress Notes (Signed)
   Subjective:  66 y.o. male with PMHx of diabetes mellitus presenting today for new complaint of recurrent ankle sprains to the right foot.  Patient wears an AFO right lower extremity.  This is been ongoing for a few years now.  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    Diabetes ([place])    GERD (gastroesophageal reflux disease)    Hyperlipidemia    Hypertension    Obesity    Sleep apnea    Past Surgical History:  Procedure Laterality Date   BACK SURGERY     in the 80's -herniated disc   BACK SURGERY     L4 bone spur 1990   CARPAL TUNNEL RELEASE Right 12/28/2016   Procedure: RIGHT ULNAR AND MEDIAN NEUROPLASTY AT WRIST;  Surgeon: Milly Jakob, MD;  Location: [place];  Service: Orthopedics;  Laterality: Right;   CARPAL TUNNEL RELEASE Left    25 years ago   CATARACT EXTRACTION W/ INTRAOCULAR LENS IMPLANT Bilateral    REPLACEMENT TOTAL KNEE BILATERAL  2014   left 2012 rt 2014   SHOULDER OPEN ROTATOR CUFF REPAIR Right 08/07/2019   Procedure: ROTATOR CUFF REPAIR SHOULDER OPEN;  Surgeon: Thornton Park, MD;  Location: [facility];  Service: Orthopedics;  Laterality: Right;   TONSILLECTOMY     Allergies  Allergen Reactions   Penicillins Anaphylaxis, Rash and Other (See Comments)   Succinylcholine Chloride Anaphylaxis and Rash   Keflex [Cephalexin] Rash   Sulfa Antibiotics Rash and Other (See Comments)    Objective/Physical Exam General: The patient is alert and oriented x3 in no acute distress.  Dermatology:  No open wounds noted.  Skin is warm to touch.  Vascular: Palpable pedal pulses bilaterally. No edema or erythema noted. Capillary refill within normal limits.  Neurological: Epicritic and protective threshold diminished  bilaterally.   Musculoskeletal Exam: Varus foot deformity noted.  Loss of muscle strength with dorsiflexion and eversion of the foot  Assessment: 1. Chronic ankle sprains RLE 2. Foot Drop RLE 3.  Varus foot right   Plan of Care:  1. Patient was evaluated. 2.  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  The foot is in a varus type position due to overcompensation from the medial ankle tendons. 3.  Continue the AFO with good supportive shoes 4.  Appointment with Pedorthist for custom molded ankle brace or Moore balance brace 5.  Return to clinic as needed  Edrick Kins, DPM [facility]  Dr. Edrick Kins, DPM    [address], [place] [address]                Office [phone]  Fax [phone]

## 2021-10-23 ENCOUNTER — Encounter: Payer: Self-pay | Admitting: Physical Therapy

## 2021-10-23 ENCOUNTER — Ambulatory Visit: Payer: Medicare Other | Attending: Student | Admitting: Physical Therapy

## 2021-10-23 DIAGNOSIS — M6281 Muscle weakness (generalized): Secondary | ICD-10-CM | POA: Insufficient documentation

## 2021-10-23 DIAGNOSIS — R278 Other lack of coordination: Secondary | ICD-10-CM | POA: Insufficient documentation

## 2021-10-23 DIAGNOSIS — R2681 Unsteadiness on feet: Secondary | ICD-10-CM | POA: Insufficient documentation

## 2021-10-23 DIAGNOSIS — R296 Repeated falls: Secondary | ICD-10-CM | POA: Insufficient documentation

## 2021-10-23 DIAGNOSIS — R2689 Other abnormalities of gait and mobility: Secondary | ICD-10-CM | POA: Diagnosis present

## 2021-10-23 DIAGNOSIS — R262 Difficulty in walking, not elsewhere classified: Secondary | ICD-10-CM | POA: Insufficient documentation

## 2021-10-23 DIAGNOSIS — R269 Unspecified abnormalities of gait and mobility: Secondary | ICD-10-CM | POA: Insufficient documentation

## 2021-10-23 NOTE — Therapy (Signed)
[place] [facility] MAIN [facility] SERVICES [address] [place], [place], [address] Phone: [phone]   Fax:  [phone]  Physical Therapy Treatment  Patient Details  Name: Nathan Dean MRN: [id number] Date of Birth: [birth year] Referring Provider (PT): Vernard Gambles [place]   Encounter Date: 10/23/2021   PT End of Session - 10/23/21 1020     Visit Number 34    Number of Visits 41    Date for PT Re-Evaluation 11/18/21    Authorization Type 1/10 eval 02/24/21 Auth 06/03/21-08/26/21; Recert= 08/26/2021-11/18/2021    Progress Note Due on Visit 40    PT Start Time 1015    PT Stop Time 1055    PT Time Calculation (min) 40 min    Equipment Utilized During Treatment Gait belt    Activity Tolerance Patient tolerated treatment well;Patient limited by fatigue    Behavior During Therapy WFL for tasks assessed/performed             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    Diabetes (HCC)    GERD (gastroesophageal reflux disease)    Hyperlipidemia    Hypertension    Obesity    Sleep apnea     Past Surgical History:  Procedure Laterality Date   BACK SURGERY     in the 80's -herniated disc   BACK SURGERY     L4 bone spur 1990   CARPAL TUNNEL RELEASE Right 12/28/2016   Procedure: RIGHT ULNAR AND MEDIAN NEUROPLASTY AT WRIST;  Surgeon: Mack Hook, MD;  Location: [facility];  Service: Orthopedics;  Laterality: Right;   CARPAL TUNNEL RELEASE Left    25 years ago   CATARACT EXTRACTION W/ INTRAOCULAR LENS IMPLANT Bilateral    REPLACEMENT TOTAL KNEE BILATERAL  2014   left 2012 rt 2014   SHOULDER OPEN ROTATOR CUFF REPAIR Right 08/07/2019   Procedure: ROTATOR CUFF REPAIR SHOULDER OPEN;  Surgeon: Juanell Fairly, MD;  Location: [facility];  Service: Orthopedics;  Laterality: Right;   TONSILLECTOMY      There were no vitals filed for this visit.   Subjective Assessment -  10/23/21 1010     Subjective Pt reports he is having a new brace designed for his right foot. Pt reports continued significant pain in his shoulder. Pt also reports compelting HEp regularly with no new falls.    Patient is accompained by: Family member    Pertinent History Patient has seen this therapist in 2019. Was being treated at [facility] clinic but is transferring to this clinic for balance training. PMH includes sleep apnea, HTN, aortic valve disease, GERD, genituourinary malignancy; renal, hydrocephalus s/p shunt placed 03/2020, DVT, Type II DM, obesity, OA, covid, depression, diabetic polyneuropathy, AD, pressure injury of right foot, and falls. Patient had R rotator cuff surgery a year and a half and has not had a good recovery process.  Patient has a history of falling prior to COVID, falls decreased after shunt but still are present. Will see a podiatrist soon for his pressure injury on R foot. Referral is for diabetic polyneuropathy, foot drop R foot, and falling. Patient reports >30 falls in past 6 months.    Limitations Sitting;Standing;Walking;House hold activities;Other (comment)    How long can you sit comfortably? a few hours    How long can you stand comfortably? immediately unstable    How long can you walk comfortably? immediately unstable    Patient Stated Goals  improve leg strength. more independent, less falls    Currently in Pain? Yes    Pain Score 8     Pain Location Shoulder    Pain Orientation Right    Pain Descriptors / Indicators Sharp    Pain Type Chronic pain    Pain Onset More than a month ago                Reviewed and updated the HEP in pt instructions    Exercise/Activity Sets/Reps/Time/ Resistance Assistance Charge type Comments  // bar obstacle course anterior step overs  3 laps UE therex First attempt patient on mild constant visual attention to objects and on second and third lap patient required to use minimal visual attention to objects.   Cues for slow and deliberate movements not trying to rush as with rushing patient does not adequately calculate distance and tends to make areas and had objects.  With cues for focusing on some deliberate movements patient made  // bar lateral stepping obstacles  2 laps  UE therex Rest break required, first round constant visual attention to objects, second lap patient allowed minimal visual attention to object with focus on looking forward when completing to improve proprioception and foot placement without falls or loss of balance  Hip hinge and glute squeeze  X 10  UE therex Cues for proper muscle activation   Hip extension  *10 UE therex Visual cues and tactile cues ( hand on pt chest for feedback with forward bend hip compensatory movement)     Mini squat with hip hinge focus  *10 UE therex Cues for glute squeze for full erect posture each rep, mirror for visual feedback   STS with band around knees for glute activation.  *6 *4  therex Raised height plinth table                                 Treatment Provided this session   Rationale for Evaluation and Treatment Rehabilitation  Pt educated throughout session about proper posture and technique with exercises. Improved exercise technique, movement at target joints, use of target muscles after min to mod verbal, visual, tactile cues. Note: Portions of this document were prepared using Dragon voice recognition software and although reviewed may contain unintentional dictation errors in syntax, grammar, or spelling.     Pt required occasional rest breaks due fatigue, PT was quick to ask when pt appeared to be fatiguing in order to prevent excessive fatigue. Pt educated throughout session about proper posture and technique with exercises. Improved exercise technique, movement at target joints, use of target muscles after min to mod verbal, visual, tactile cues. Rationale for Evaluation and Treatment Rehabilitation                                   PT Education - 10/23/21 1016     Education Details exercise technique    Person(s) Educated Patient    Methods Explanation;Demonstration    Comprehension Verbalized understanding;Returned demonstration              PT Short Term Goals - 06/26/21 0956       PT SHORT TERM GOAL #1   Title Patient will be independent in home exercise program to improve strength/mobility for better functional independence with ADLs.    Baseline 10/11 HEP given, 06/03/21 updated HEP provided,  2/10 HEP going well.    Time 4    Period Weeks    Status Achieved    Target Date 07/29/21      PT SHORT TERM GOAL #2   Title Patient will report no falls in past two weeks indicating improved stability and decreased fall risk.    Baseline 10/11; multiple falls 12/2: fall within this week (reports decrease in frequency) 1/18: fall within past week 2/10 no falls in several weeks, ambulating with cane around the home.    Time 4    Period Weeks    Status Achieved    Target Date 03/24/21               PT Long Term Goals - 08/26/21 1048       PT LONG TERM GOAL #1   Title Patient will increase FOTO score to equal to or greater than  58%   to demonstrate statistically significant improvement in mobility and quality of life.    Baseline 10/11: 41%, 04/17/21: 53 2/10: 53%; 08/26/2021=55%    Time 12    Period Weeks    Status On-going    Target Date 11/18/21      PT LONG TERM GOAL #2   Title Patient will increase ABC scale score >60% to demonstrate better functional mobility and better confidence with ADLs.     Baseline 10/11: 32.5% 12/2:50.1% 06/03/21: 53.4%; 08/26/2021= 67.5%    Time 12    Period Weeks    Status Achieved    Target Date 08/26/21      PT LONG TERM GOAL #3   Title Patient will increase Berg Balance score by > 6 points (21/56)  to demonstrate decreased fall risk during functional activities.    Baseline 10/11: 15/56 06/03/21: 26;  08/26/2021= 27/56    Time 12    Period Weeks    Status On-going    Target Date 11/18/21      PT LONG TERM GOAL #4   Title Patient (> 73 years old) will complete five times sit to stand test in < 20 seconds from 24 inch surface indicating an increased LE strength and improved balance.    Baseline 10/11: unable to perform from standard height chair, 24 inches 24.52 06/03/21: 22.5sec from 26 in table height 06/26/21: 16.9s from 24 inch table height    Time 12    Period Weeks    Status Achieved    Target Date 08/26/21      PT LONG TERM GOAL #5   Title Patient will increase BLE gross strength to 4+/5 ( excluding ankle strength)  to improve functional strength for independent gait, increased standing tolerance and increased ADL ability.    Baseline 10/11: see note 2/10: not tested; 08/26/2021=B hip flex= 3+/5; B knee ext= 4/5; B knee flex= 4/5    Time 12    Period Weeks    Status On-going    Target Date 11/18/21      Additional Long Term Goals   Additional Long Term Goals Yes      PT LONG TERM GOAL #6   Title Patient will perform sit to stand from surfaces 24 in or higher with left UE support with modified independence to improve functional strength with household transfers required for potential upcoming right shoulder surgery.    Baseline 08/26/2021= Patient requiring B UE Support with all transfers and reports difficulty rising from lift chair at home and will need sufficient strength if he has R  Shoulder surgery.  Time 12    Period Weeks    Status New    Target Date 11/18/21      PT LONG TERM GOAL #7   Title Patient will ambulate > 100 feet with hemiwalker on left on level surfaces for improved functional mobility and to prepare for potential Right shoulder Surgery.    Baseline 08/26/2021= Patient requires a 4WW for all mobility due to imbalance and weakness and needs to be off walker prior to Ortho Surgeon to clear him for potential Right shoulder surgery.    Time 12    Period Weeks     Status New    Target Date 11/18/21      PT LONG TERM GOAL #8   Title Patient will perform a floor to sit transfer with Min assist with good and safe body mechanics to decrease risk of injury in the event of a fall.    Baseline 08/26/2021= Paitent is dependent and inconsistent in his ability to get up from a fall with recurrent fall history.    Time 12    Period Weeks    Status New    Target Date 11/18/21                   Plan - 10/23/21 1020     Clinical Impression Statement Patient demonstrates improved tolerance for therapeutic exercise but still requires frequent rest breaks throughout due to fatigue.  Patient progressed with object navigation was able to complete with cues for slow and deliberate movements given without constant visual attention to objects.  Patient not have loss of balance patient did test if again fatigue with this activity.  Patient continued with gluteal activation exercises in order to improve his glutes strength and assistance from gluteal muscles with everyday activities.  Patient required significant cues for these activities but did not demonstrate improved ability to complete activities today.  Patient will continue to benefit from skilled physical therapy in order to improve his strength, endurance, reduce risk of falls, and improve quality of life.    Personal Factors and Comorbidities Age;Comorbidity 3+;Finances;Fitness;Past/Current Experience;Social Background;Time since onset of injury/illness/exacerbation;Transportation    Comorbidities leep apnea, HTN, aortic valve disease, GERD, genituourinary malignancy; renal, hydrocephalus s/p shunt placed 03/2020, DVT, Type II DM, obesity, OA, covid, depression, diabetic polyneuropathy, AD, pressure injury of right foot, and falls    Examination-Activity Limitations Bathing;Bed Mobility;Bend;Caring for Others;Carry;Dressing;Hygiene/Grooming;Stairs;Squat;Sit;Reach Overhead;Locomotion  Level;Lift;Stand;Toileting;Transfers    Examination-Participation Restrictions Cleaning;Community Activity;Driving;Interpersonal Relationship;Laundry;Shop;Personal Finances;Occupation;Meal Prep;Volunteer;Yard Work    Conservation officer, historic buildings Evolving/Moderate complexity    Rehab Potential Fair    PT Frequency 1x / week    PT Duration 12 weeks    PT Treatment/Interventions ADLs/Self Care Home Management;Aquatic Therapy;Biofeedback;Electrical Stimulation;Traction;Functional mobility training;Stair training;Gait training;DME Instruction;Ultrasound;Moist Heat;Therapeutic activities;Therapeutic exercise;Neuromuscular re-education;Balance training;Manual techniques;Orthotic Fit/Training;Patient/family education;Compression bandaging;Passive range of motion;Dry needling;Vestibular;Taping;Vasopneumatic Device;Energy conservation;Manual lymph drainage;Canalith Repostioning;Visual/perceptual remediation/compensation    PT Next Visit Plan Continue with progressive LE strengthening and mobility training as appropriate. Floor to sit transfers. Gait training with least restrictive AD.    PT Home Exercise Plan No changes    Consulted and Agree with Plan of Care Patient             Patient will benefit from skilled therapeutic intervention in order to improve the following deficits and impairments:  Abnormal gait, Decreased activity tolerance, Decreased balance, Decreased knowledge of precautions, Decreased endurance, Decreased coordination, Decreased mobility, Decreased range of motion, Difficulty walking, Decreased strength, Hypomobility, Impaired flexibility, Impaired perceived functional ability, Impaired sensation, Postural  dysfunction, Improper body mechanics, Cardiopulmonary status limiting activity, Impaired UE functional use, Obesity, Pain  Visit Diagnosis: Abnormality of gait and mobility  Difficulty in walking, not elsewhere classified  Repeated falls  Other abnormalities of gait and  mobility     Problem List Patient Active Problem List   Diagnosis Date Noted   Chronic right shoulder pain 07/30/2021   Cervical facet joint syndrome 07/30/2021   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/2021   Chronic pain syndrome 07/30/2021   History of bilateral knee replacement 07/30/2021   Bilateral chronic knee pain 07/30/2021   Post laminectomy syndrome 07/30/2021   Acute diastolic CHF (congestive heart failure) (HCC) 08/17/2019   Edema    Acute metabolic encephalopathy 08/12/2019   Acute pulmonary edema (HCC) 08/12/2019   Acute cor pulmonale (HCC) 08/11/2019   Oxygen desaturation 08/08/2019   S/P right rotator cuff repair 08/07/2019   Hypoxia 10/22/2018   Infection of right prepatellar bursa 10/18/2018   Sepsis (HCC) 10/18/2018   Chest congestion 07/20/2017   Viral illness 07/20/2017   Lower respiratory infection 07/12/2017   Laryngitis, acute 07/12/2017   Cough 07/12/2017   Pleurisy 07/12/2017   Depression 06/03/2015   HTN (hypertension) 01/02/2015   Hyperlipidemia 01/02/2015   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 (HCC) 12/23/2014   Sleep apnea 09/21/2010   Arthritis, degenerative 02/17/2009   Hereditary and idiopathic peripheral neuropathy 11/21/2008   AD (atopic dermatitis) 07/08/2008   Acid reflux 04/05/2007    Norman Herrlichhristopher B Kyen Taite, PT 10/23/2021, 11:04 AM   [facility] [address] [place], [place], [address] Phone: [phone]   Fax:  [phone]  Name: Nathan MottGregory Dean MRN: [id number] Date of Birth: 11/24/1955

## 2021-10-27 ENCOUNTER — Ambulatory Visit: Payer: Medicare Other

## 2021-10-30 ENCOUNTER — Ambulatory Visit: Payer: Medicare Other | Admitting: Physical Therapy

## 2021-10-30 ENCOUNTER — Ambulatory Visit: Payer: Medicare Other

## 2021-10-30 DIAGNOSIS — R269 Unspecified abnormalities of gait and mobility: Secondary | ICD-10-CM | POA: Diagnosis not present

## 2021-10-30 DIAGNOSIS — E1142 Type 2 diabetes mellitus with diabetic polyneuropathy: Secondary | ICD-10-CM

## 2021-10-30 DIAGNOSIS — R262 Difficulty in walking, not elsewhere classified: Secondary | ICD-10-CM

## 2021-10-30 DIAGNOSIS — R296 Repeated falls: Secondary | ICD-10-CM

## 2021-10-30 NOTE — Therapy (Signed)
[facility] [address] [place], [place], [address] Phone: [phone]   Fax:  [phone]  Physical Therapy Treatment  Patient Details  Name: Nathan Dean MRN: [id number] Date of Birth: 06/21/1955 Referring Provider (PT): Vernard GamblesPaich, Katilin GeorgiaPA   Encounter Date: 10/30/2021   PT End of Session - 10/30/21 1022     Visit Number 35    Number of Visits 41    Date for PT Re-Evaluation 11/18/21    Authorization Type 1/10 eval 02/24/21 Auth 06/03/21-08/26/21; Recert= 08/26/2021-11/18/2021    Progress Note Due on Visit 40    PT Start Time 1015    PT Stop Time 1044    PT Time Calculation (min) 29 min    Equipment Utilized During Treatment Gait belt    Activity Tolerance Patient tolerated treatment well;Patient limited by fatigue    Behavior During Therapy WFL for tasks assessed/performed             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    Diabetes (HCC)    GERD (gastroesophageal reflux disease)    Hyperlipidemia    Hypertension    Obesity    Sleep apnea     Past Surgical History:  Procedure Laterality Date   BACK SURGERY     in the 80's -herniated disc   BACK SURGERY     L4 bone spur 1990   CARPAL TUNNEL RELEASE Right 12/28/2016   Procedure: RIGHT ULNAR AND MEDIAN NEUROPLASTY AT WRIST;  Surgeon: Mack Hookhompson, David, MD;  Location: [facility];  Service: Orthopedics;  Laterality: Right;   CARPAL TUNNEL RELEASE Left    25 years ago   CATARACT EXTRACTION W/ INTRAOCULAR LENS IMPLANT Bilateral    REPLACEMENT TOTAL KNEE BILATERAL  2014   left 2012 rt 2014   SHOULDER OPEN ROTATOR CUFF REPAIR Right 08/07/2019   Procedure: ROTATOR CUFF REPAIR SHOULDER OPEN;  Surgeon: Juanell FairlyKrasinski, Kevin, MD;  Location: [facility];  Service: Orthopedics;  Laterality: Right;   TONSILLECTOMY      There were no vitals filed for this visit.   Subjective Assessment -  10/30/21 1018     Subjective Pt reports he had a fall last night when his ankle gave out on him. Pt is going for ablation in July to help with his right shoulder pain. Pt is also going to discuss left shoulder with his MD. Pt reports the fall put him out of wack. Pt report old injury when sledding and has had left hip dysfunciton since.    Patient is accompained by: Family member    Pertinent History Patient has seen this therapist in 2019. [facility] but is transferring to this clinic for balance training. PMH includes sleep apnea, HTN, aortic valve disease, GERD, genituourinary malignancy; renal, hydrocephalus s/p shunt placed 03/2020, DVT, Type II DM, obesity, OA, covid, depression, diabetic polyneuropathy, AD, pressure injury of right foot, and falls. Patient had R rotator cuff surgery a year and a half and has not had a good recovery process.  Patient has a history of falling prior to COVID, falls decreased after shunt but still are present. Will see a podiatrist soon for his pressure injury on R foot. Referral is for diabetic polyneuropathy, foot drop R foot, and falling. Patient reports >30 falls in past 6 months.    Limitations Sitting;Standing;Walking;House hold activities;Other (comment)    How long can you sit comfortably?  a few hours    How long can you stand comfortably? immediately unstable    How long can you walk comfortably? immediately unstable    Patient Stated Goals improve leg strength. more independent, less falls    Pain Onset More than a month ago                Reviewed and updated the HEP in pt instructions    Exercise/Activity Sets/Reps/Time/ Resistance Assistance Charge type Comments  Education regarding safe ways to get off the floor with this current shoulder pain.      // bar lateral stepping obstacles  2 laps  UE There act Significant fatigue noted and rest break required between  To improve object navigation as well as to activate  latera glute musculature   Sit to stand from max raised table with glute squeeze at end  3 x 5 reps   Theract  To target glute strength and activation with functional activity, some difficulty noted at end range due to glute activation.                                                     Treatment Provided this session   Rationale for Evaluation and Treatment Rehabilitation  Pt educated throughout session about proper posture and technique with exercises. Improved exercise technique, movement at target joints, use of target muscles after min to mod verbal, visual, tactile cues. Note: Portions of this document were prepared using Dragon voice recognition software and although reviewed may contain unintentional dictation errors in syntax, grammar, or spelling.                          PT Education - 10/30/21 1022     Education Details exercise technique    Person(s) Educated Patient    Methods Explanation;Demonstration    Comprehension Verbalized understanding;Returned demonstration              PT Short Term Goals - 06/26/21 0956       PT SHORT TERM GOAL #1   Title Patient will be independent in home exercise program to improve strength/mobility for better functional independence with ADLs.    Baseline 10/11 HEP given, 06/03/21 updated HEP provided, 2/10 HEP going well.    Time 4    Period Weeks    Status Achieved    Target Date 07/29/21      PT SHORT TERM GOAL #2   Title Patient will report no falls in past two weeks indicating improved stability and decreased fall risk.    Baseline 10/11; multiple falls 12/2: fall within this week (reports decrease in frequency) 1/18: fall within past week 2/10 no falls in several weeks, ambulating with cane around the home.    Time 4    Period Weeks    Status Achieved    Target Date 03/24/21               PT Long Term Goals - 08/26/21 1048       PT LONG TERM GOAL #1   Title Patient will increase  FOTO score to equal to or greater than  58%   to demonstrate statistically significant improvement in mobility and quality of life.    Baseline 10/11: 41%, 04/17/21: 53 2/10: 53%; 08/26/2021=55%    Time  12    Period Weeks    Status On-going    Target Date 11/18/21      PT LONG TERM GOAL #2   Title Patient will increase ABC scale score >60% to demonstrate better functional mobility and better confidence with ADLs.     Baseline 10/11: 32.5% 12/2:50.1% 06/03/21: 53.4%; 08/26/2021= 67.5%    Time 12    Period Weeks    Status Achieved    Target Date 08/26/21      PT LONG TERM GOAL #3   Title Patient will increase Berg Balance score by > 6 points (21/56)  to demonstrate decreased fall risk during functional activities.    Baseline 10/11: 15/56 06/03/21: 26; 08/26/2021= 27/56    Time 12    Period Weeks    Status On-going    Target Date 11/18/21      PT LONG TERM GOAL #4   Title Patient (> 39 years old) will complete five times sit to stand test in < 20 seconds from 24 inch surface indicating an increased LE strength and improved balance.    Baseline 10/11: unable to perform from standard height chair, 24 inches 24.52 06/03/21: 22.5sec from 26 in table height 06/26/21: 16.9s from 24 inch table height    Time 12    Period Weeks    Status Achieved    Target Date 08/26/21      PT LONG TERM GOAL #5   Title Patient will increase BLE gross strength to 4+/5 ( excluding ankle strength)  to improve functional strength for independent gait, increased standing tolerance and increased ADL ability.    Baseline 10/11: see note 2/10: not tested; 08/26/2021=B hip flex= 3+/5; B knee ext= 4/5; B knee flex= 4/5    Time 12    Period Weeks    Status On-going    Target Date 11/18/21      Additional Long Term Goals   Additional Long Term Goals Yes      PT LONG TERM GOAL #6   Title Patient will perform sit to stand from surfaces 24 in or higher with left UE support with modified independence to improve functional  strength with household transfers required for potential upcoming right shoulder surgery.    Baseline 08/26/2021= Patient requiring B UE Support with all transfers and reports difficulty rising from lift chair at home and will need sufficient strength if he has R  Shoulder surgery.    Time 12    Period Weeks    Status New    Target Date 11/18/21      PT LONG TERM GOAL #7   Title Patient will ambulate > 100 feet with hemiwalker on left on level surfaces for improved functional mobility and to prepare for potential Right shoulder Surgery.    Baseline 08/26/2021= Patient requires a 4WW for all mobility due to imbalance and weakness and needs to be off walker prior to Ortho Surgeon to clear him for potential Right shoulder surgery.    Time 12    Period Weeks    Status New    Target Date 11/18/21      PT LONG TERM GOAL #8   Title Patient will perform a floor to sit transfer with Min assist with good and safe body mechanics to decrease risk of injury in the event of a fall.    Baseline 08/26/2021= Paitent is dependent and inconsistent in his ability to get up from a fall with recurrent fall history.    Time 12  Period Weeks    Status New    Target Date 11/18/21                   Plan - 10/30/21 1026     Clinical Impression Statement Patient demonstrates good motivation for completion of physical therapy activities.  Patient does present with some left-sided low back pain as a result of a fall he sustained last night.  Patient reports this is back pain that comes and goes and he has had issues with this side since an injury he sustained in his late childhood.  Patient was limited also due to time restraints with conflicting appointment following physical therapy session this date.  Patient continues to have significant balance deficits as well as sustaining falls but he is starting to realize because of falls and taking steps to prevent these.  Patient was educated regarding and safe ways  to get up off the floor with his right upper extremity shoulder pain in mind.  Patient will continue to benefit from skilled physical therapy to improve his strength, balance, safety, and quality of life.    Personal Factors and Comorbidities Age;Comorbidity 3+;Finances;Fitness;Past/Current Experience;Social Background;Time since onset of injury/illness/exacerbation;Transportation    Comorbidities leep apnea, HTN, aortic valve disease, GERD, genituourinary malignancy; renal, hydrocephalus s/p shunt placed 03/2020, DVT, Type II DM, obesity, OA, covid, depression, diabetic polyneuropathy, AD, pressure injury of right foot, and falls    Examination-Activity Limitations Bathing;Bed Mobility;Bend;Caring for Others;Carry;Dressing;Hygiene/Grooming;Stairs;Squat;Sit;Reach Overhead;Locomotion Level;Lift;Stand;Toileting;Transfers    Examination-Participation Restrictions Cleaning;Community Activity;Driving;Interpersonal Relationship;Laundry;Shop;Personal Finances;Occupation;Meal Prep;Volunteer;Yard Work    Conservation officer, historic buildings Evolving/Moderate complexity    Rehab Potential Fair    PT Frequency 1x / week    PT Duration 12 weeks    PT Treatment/Interventions ADLs/Self Care Home Management;Aquatic Therapy;Biofeedback;Electrical Stimulation;Traction;Functional mobility training;Stair training;Gait training;DME Instruction;Ultrasound;Moist Heat;Therapeutic activities;Therapeutic exercise;Neuromuscular re-education;Balance training;Manual techniques;Orthotic Fit/Training;Patient/family education;Compression bandaging;Passive range of motion;Dry needling;Vestibular;Taping;Vasopneumatic Device;Energy conservation;Manual lymph drainage;Canalith Repostioning;Visual/perceptual remediation/compensation    PT Next Visit Plan Continue with progressive LE strengthening and mobility training as appropriate. Floor to sit transfers. Gait training with least restrictive AD.    PT Home Exercise Plan No changes     Consulted and Agree with Plan of Care Patient             Patient will benefit from skilled therapeutic intervention in order to improve the following deficits and impairments:  Abnormal gait, Decreased activity tolerance, Decreased balance, Decreased knowledge of precautions, Decreased endurance, Decreased coordination, Decreased mobility, Decreased range of motion, Difficulty walking, Decreased strength, Hypomobility, Impaired flexibility, Impaired perceived functional ability, Impaired sensation, Postural dysfunction, Improper body mechanics, Cardiopulmonary status limiting activity, Impaired UE functional use, Obesity, Pain  Visit Diagnosis: Abnormality of gait and mobility  Difficulty in walking, not elsewhere classified  Repeated falls     Problem List Patient Active Problem List   Diagnosis Date Noted   Chronic right shoulder pain 07/30/2021   Cervical facet joint syndrome 07/30/2021   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/2021   Chronic pain syndrome 07/30/2021   History of bilateral knee replacement 07/30/2021   Bilateral chronic knee pain 07/30/2021   Post laminectomy syndrome 07/30/2021   Acute diastolic CHF (congestive heart failure) (HCC) 08/17/2019   Edema    Acute metabolic encephalopathy 08/12/2019   Acute pulmonary edema (HCC) 08/12/2019   Acute cor pulmonale (HCC) 08/11/2019   Oxygen desaturation 08/08/2019   S/P right rotator cuff repair 08/07/2019   Hypoxia 10/22/2018   Infection of right prepatellar bursa 10/18/2018   Sepsis (HCC) 10/18/2018  Chest congestion 07/20/2017   Viral illness 07/20/2017   Lower respiratory infection 07/12/2017   Laryngitis, acute 07/12/2017   Cough 07/12/2017   Pleurisy 07/12/2017   Depression 06/03/2015   HTN (hypertension) 01/02/2015   Hyperlipidemia 01/02/2015   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

## 2021-10-30 NOTE — Progress Notes (Signed)
Patient was seen for AFO consult. Patient presents with existing right foot drop AFO from [facility] inpatient three years prior. Device is well fitting and intact. Patient reports he has difficulty using it at home without a shoe. Explained to patient that the device is not intended to be worn without a shoe. I recommended he purchase a pair of sneakers for use in the house only. Patient understands. All questions answered and concerns addressed.

## 2021-11-03 ENCOUNTER — Ambulatory Visit: Payer: Medicare Other

## 2021-11-06 ENCOUNTER — Ambulatory Visit: Payer: Medicare Other

## 2021-11-06 ENCOUNTER — Ambulatory Visit: Payer: Medicare Other | Admitting: Physical Therapy

## 2021-11-06 ENCOUNTER — Telehealth: Payer: Self-pay | Admitting: Physical Therapy

## 2021-11-06 DIAGNOSIS — R262 Difficulty in walking, not elsewhere classified: Secondary | ICD-10-CM

## 2021-11-06 DIAGNOSIS — R278 Other lack of coordination: Secondary | ICD-10-CM

## 2021-11-06 DIAGNOSIS — R2681 Unsteadiness on feet: Secondary | ICD-10-CM

## 2021-11-06 DIAGNOSIS — R296 Repeated falls: Secondary | ICD-10-CM

## 2021-11-06 DIAGNOSIS — M6281 Muscle weakness (generalized): Secondary | ICD-10-CM

## 2021-11-06 DIAGNOSIS — R269 Unspecified abnormalities of gait and mobility: Secondary | ICD-10-CM

## 2021-11-06 DIAGNOSIS — R2689 Other abnormalities of gait and mobility: Secondary | ICD-10-CM

## 2021-11-09 ENCOUNTER — Ambulatory Visit: Payer: Medicare Other | Admitting: Physical Therapy

## 2021-11-13 ENCOUNTER — Ambulatory Visit: Payer: Medicare Other | Admitting: Physical Therapy

## 2021-11-16 ENCOUNTER — Ambulatory Visit: Payer: Medicare Other

## 2021-11-20 ENCOUNTER — Ambulatory Visit: Payer: Medicare Other | Attending: Student | Admitting: Physical Therapy

## 2021-11-20 DIAGNOSIS — R2689 Other abnormalities of gait and mobility: Secondary | ICD-10-CM | POA: Insufficient documentation

## 2021-11-20 DIAGNOSIS — R262 Difficulty in walking, not elsewhere classified: Secondary | ICD-10-CM | POA: Insufficient documentation

## 2021-11-20 DIAGNOSIS — R269 Unspecified abnormalities of gait and mobility: Secondary | ICD-10-CM | POA: Insufficient documentation

## 2021-11-20 DIAGNOSIS — R296 Repeated falls: Secondary | ICD-10-CM | POA: Insufficient documentation

## 2021-11-20 NOTE — Therapy (Signed)
OUTPATIENT PHYSICAL THERAPY TREATMENT NOTE /  Discharge Summary / Recert note   Patient Name: Nathan Dean MRN: [id number] DOB:04/30/1956, 66 y.o., male Today's Date: 11/20/2021  PCP: Dr. Hulan Saas REFERRING PROVIDER: Kerry Dory, PA-C   PT End of Session - 11/20/21 0927     Visit Number 37    Number of Visits 41    Date for PT Re-Evaluation 11/18/21    Authorization Type 1/10 eval 02/24/21 Auth 06/03/21-08/26/21; Recert= [id number]    Progress Note Due on Visit 4    PT Start Time 0925    PT Stop Time 0955    PT Time Calculation (min) 30 min    Equipment Utilized During Treatment Gait belt    Activity Tolerance Patient tolerated treatment well;Patient limited by fatigue    Behavior During Therapy WFL for tasks assessed/performed              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    Diabetes ([place])    GERD (gastroesophageal reflux disease)    Hyperlipidemia    Hypertension    Obesity    Sleep apnea    Past Surgical History:  Procedure Laterality Date   BACK SURGERY     in the 80's -herniated disc   BACK SURGERY     L4 bone spur 1990   CARPAL TUNNEL RELEASE Right 12/28/2016   Procedure: RIGHT ULNAR AND MEDIAN NEUROPLASTY AT WRIST;  Surgeon: Milly Jakob, MD;  Location: [facility];  Service: Orthopedics;  Laterality: Right;   CARPAL TUNNEL RELEASE Left    25 years ago   CATARACT EXTRACTION W/ INTRAOCULAR LENS IMPLANT Bilateral    REPLACEMENT TOTAL KNEE BILATERAL  2014   left 2012 rt 2014   SHOULDER OPEN ROTATOR CUFF REPAIR Right 08/07/2019   Procedure: ROTATOR CUFF REPAIR SHOULDER OPEN;  Surgeon: Thornton Park, MD;  Location: [facility];  Service: Orthopedics;  Laterality: Right;   TONSILLECTOMY     Patient Active Problem List   Diagnosis Date Noted   Chronic right shoulder pain 07/30/2021   Cervical facet joint syndrome 07/30/2021   Right rotator cuff  tear arthropathy 07/30/2021   Chronic pain syndrome 07/30/2021   History of bilateral knee replacement 07/30/2021   Bilateral chronic knee pain 07/30/2021   Post laminectomy syndrome 64/68/0321   Acute diastolic CHF (congestive heart failure) ([place]) 08/17/2019   Edema    Acute metabolic encephalopathy 22/48/2500   Acute pulmonary edema ([place]) 08/12/2019   Acute cor pulmonale ([facility]) 08/11/2019   Oxygen desaturation 08/08/2019   S/P right rotator cuff repair 08/07/2019   Hypoxia 10/22/2018   Infection of right prepatellar bursa 10/18/2018   Sepsis ([place]) 10/18/2018   Chest congestion 07/20/2017   Viral illness 07/20/2017   Lower respiratory infection 07/12/2017   Laryngitis, acute 07/12/2017   Cough 07/12/2017   Pleurisy 07/12/2017   Depression 06/03/2015   HTN (hypertension) 01/02/2015   Hyperlipidemia 01/02/2015   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 ([place]) 12/23/2014   Sleep apnea 09/21/2010   Arthritis, degenerative 02/17/2009   Hereditary and idiopathic peripheral neuropathy 11/21/2008   AD (atopic dermatitis) 07/08/2008   Acid reflux 04/05/2007    REFERRING DIAG:  Communicating hydrocephalus  E11.42 (ICD-10-CM) - Type 2 diabetes mellitus with diabetic polyneuropathy  M21.371 (ICD-10-CM) - Foot drop, right foot  Z91.81 (ICD-10-CM) - History of falling    THERAPY DIAG:  Abnormality of gait and mobility  Difficulty in walking, not elsewhere classified  Repeated falls  Other abnormalities of gait and mobility  Rationale for Evaluation and Treatment Rehabilitation  PERTINENT HISTORY: Patient has seen this therapist in 2019. Was being [facility] at [facility] but is transferring to this clinic for balance training. 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 Patient had R rotator cuff surgery a year and a half and has not had a good recovery process. Patient has a history of falling prior to COVID, falls decreased after shunt but still are present. Will see a podiatrist soon for his pressure injury on R foot. Referral is for diabetic polyneuropathy, foot drop R foot, and falling. Patient reports >30 falls in past 6 months.   PRECAUTIONS: fall   SUBJECTIVE: Pt reports continued worsening of s/s in his involved shoulder ( right). Pt reports no new falls since last session.   PAIN:  Are you having pain? Yes: NPRS scale: 4/10 Pain location: B shoulders Pain description: ache/sharp with active movement Aggravating factors: Active UE movement Relieving factors: Rest     TODAY'S TREATMENT:  11/20/21   Physical therapy treatment session today consisted of completing assessment of goals and administration of testing as demonstrated in goals section of this exam. Addition treatments may be found below.       PATIENT EDUCATION: Education details: Exercise technique Person educated: Patient Education method: Explanation, Demonstration, Tactile cues, and Verbal cues Education comprehension: verbalized understanding, returned demonstration, verbal cues required, tactile cues required, and needs further education   HOME EXERCISE PROGRAM: No updates today   PT Short Term Goals -       PT SHORT TERM GOAL #1   Title Patient will be independent in home exercise program to improve strength/mobility for better functional independence with ADLs.    Baseline 10/11 HEP given, 06/03/21 updated HEP provided, 2/10 HEP going well.    Time 4    Period Weeks    Status Achieved    Target Date 07/29/21      PT SHORT TERM GOAL #2   Title Patient will report no falls in past two weeks indicating improved stability and decreased fall risk.    Baseline 10/11; multiple falls 12/2: fall within this week (reports decrease in frequency)  1/18: fall within past week 2/10 no falls in several weeks, ambulating with cane around the home.    Time 4    Period Weeks    Status Achieved    Target Date 03/24/21              PT Long Term Goals -       PT LONG TERM GOAL #1   Title Patient will increase FOTO score to equal to or greater than  58%   to demonstrate statistically significant improvement in mobility and quality of life.    Baseline 10/11: 41%, 04/17/21: 53 2/10: 53%; 08/26/2021=55%  11/20/21: 60%   Time 12    Period Weeks    Status Met   Target Date 11/18/21      PT LONG TERM GOAL #2   Title Patient will increase ABC scale score >60% to demonstrate better functional mobility and better confidence with ADLs.     Baseline 10/11: 32.5% 12/2:50.1% 06/03/21: 53.4%; 08/26/2021= 67.5%    Time 12    Period Weeks    Status  Achieved    Target Date 08/26/21      PT LONG TERM GOAL #3   Title Patient will increase Berg Balance score by > 6 points (21/56)  to demonstrate decreased fall risk during functional activities.    Baseline 10/11: 15/56 06/03/21: 26; 08/26/2021= 27/56    Time 12    Period Weeks    Status Met    Target Date 11/18/21      PT LONG TERM GOAL #4   Title Patient (> 45 years old) will complete five times sit to stand test in < 20 seconds from 24 inch surface indicating an increased LE strength and improved balance.    Baseline 10/11: unable to perform from standard height chair, 24 inches 24.52 06/03/21: 22.5sec from 26 in table height 06/26/21: 16.9s from 24 inch table height    Time 12    Period Weeks    Status Achieved    Target Date 08/26/21      PT LONG TERM GOAL #5   Title Patient will increase BLE gross strength to 4+/5 ( excluding ankle strength)  to improve functional strength for independent gait, increased standing tolerance and increased ADL ability.    Baseline 10/11: see note 2/10: not tested; 08/26/2021=B hip flex= 3+/59 seated) ; B knee ext= 4/5; B knee flex= 4/5    Time 12    Period Weeks     Status partially met    Target Date 11/18/21      Additional Long Term Goals   Additional Long Term Goals Yes      PT LONG TERM GOAL #6   Title Patient will perform sit to stand from surfaces 24 in or higher with left UE support with modified independence to improve functional strength with household transfers required for potential upcoming right shoulder surgery.    Baseline 08/26/2021= Patient requiring B UE Support with all transfers and reports difficulty rising from lift chair at home and will need sufficient strength if he has R  Shoulder surgery.    Time 12    Period Weeks    Status Met - completes in clinic without difficulty    Target Date 11/18/21      PT LONG TERM GOAL #7   Title Patient will ambulate > 100 feet with hemiwalker on left on level surfaces for improved functional mobility and to prepare for potential Right shoulder Surgery.    Baseline 08/26/2021= Patient requires a 4WW for all mobility due to imbalance and weakness and needs to be off walker prior to Ortho Surgeon to clear him for potential Right shoulder surgery.  11/20/21: Completes 105 feet with SBQC and remainder 60 feet following with rollator.    Time 12    Period Weeks    Status Met, ambulates 100 feet with    Target Date 11/18/21      PT LONG TERM GOAL #8   Title Patient will perform a floor to sit transfer with Min assist with good and safe body mechanics to decrease risk of injury in the event of a fall.    Baseline 08/26/2021= Paitent is dependent and inconsistent in his ability to get up from a fall with recurrent fall history.    Time 12    Period Weeks    Status met - Pt performing at home in his house at appropriate surfaces (couch), not performed in therapy due to safety concerns    Target Date 11/18/21  Plan - 11/06/21 1031     Clinical Impression Statement Pt presents to physical thery for discharge note this date. Pt has made significant progress with physical therapy  interventions and is to be discharged with an advanced home exercise program at this time. Pt has met his maximal potential for physical therapy interventions at this time. Pt has met or nearly met all goals at this time and  discharged has been discussed extensively and all questions answered regarding continuation of care and importance of physical activity in maintaining his functional progress.    Personal Factors and Comorbidities Age;Comorbidity 3+;Finances;Fitness;Past/Current Experience;Social Background;Time since onset of injury/illness/exacerbation;Transportation    Comorbidities leep apnea, HTN, aortic valve disease, GERD, genituourinary malignancy; renal, hydrocephalus s/p shunt placed 03/2020, DVT, Type II DM, obesity, OA, covid, depression, diabetic polyneuropathy, AD, pressure injury of right foot, and falls    Examination-Activity Limitations Bathing;Bed Mobility;Bend;Caring for Others;Carry;Dressing;Hygiene/Grooming;Stairs;Squat;Sit;Reach Overhead;Locomotion Level;Lift;Stand;Toileting;Transfers    Examination-Participation Restrictions Cleaning;Community Activity;Driving;Interpersonal Relationship;Laundry;Shop;Personal Finances;Occupation;Meal Prep;Volunteer;Yard Work    [employer] Evolving/Moderate complexity    Rehab Potential Fair    PT Frequency 1x / week    PT Duration 12 weeks    PT Treatment/Interventions ADLs/Self Care Home Management;Aquatic Therapy;Biofeedback;Electrical Stimulation;Traction;Functional mobility training;Stair training;Gait training;DME Instruction;Ultrasound;Moist Heat;Therapeutic activities;Therapeutic exercise;Neuromuscular re-education;Balance training;Manual techniques;Orthotic Fit/Training;Patient/family education;Compression bandaging;Passive range of motion;Dry needling;Vestibular;Taping;Vasopneumatic Device;Energy conservation;Manual lymph drainage;Canalith Repostioning;Visual/perceptual remediation/compensation    PT Next Visit  Plan Discharge this date date   PT Home Exercise Plan No changes    Consulted and Agree with Plan of Care Patient               Particia Lather, PT 11/20/2021, 10:06 AM

## 2021-11-23 ENCOUNTER — Ambulatory Visit: Payer: Medicare Other

## 2021-11-27 ENCOUNTER — Ambulatory Visit: Payer: Medicare Other | Admitting: Physical Therapy

## 2021-11-30 ENCOUNTER — Ambulatory Visit: Payer: Medicare Other

## 2021-12-04 ENCOUNTER — Ambulatory Visit: Payer: Medicare Other | Admitting: Physical Therapy

## 2021-12-07 ENCOUNTER — Ambulatory Visit: Payer: Medicare Other | Admitting: Physical Therapy

## 2021-12-07 ENCOUNTER — Encounter: Payer: Self-pay | Admitting: Student in an Organized Health Care Education/Training Program

## 2021-12-07 ENCOUNTER — Ambulatory Visit
Payer: Medicare Other | Attending: Student in an Organized Health Care Education/Training Program | Admitting: Student in an Organized Health Care Education/Training Program

## 2021-12-07 ENCOUNTER — Ambulatory Visit
Admission: RE | Admit: 2021-12-07 | Discharge: 2021-12-07 | Disposition: A | Discharge status: Home / Self Care | Payer: Medicare Other | Source: Ambulatory Visit | Attending: Student in an Organized Health Care Education/Training Program | Admitting: Student in an Organized Health Care Education/Training Program

## 2021-12-07 VITALS — BP 141/82 | HR 83 | Temp 97.2°F | Resp 15 | Ht >= 80 in | Wt 365.0 lb

## 2021-12-07 DIAGNOSIS — G894 Chronic pain syndrome: Secondary | ICD-10-CM | POA: Diagnosis present

## 2021-12-07 DIAGNOSIS — Z9889 Other specified postprocedural states: Secondary | ICD-10-CM | POA: Diagnosis present

## 2021-12-07 DIAGNOSIS — M75101 Unspecified rotator cuff tear or rupture of right shoulder, not specified as traumatic: Secondary | ICD-10-CM | POA: Diagnosis present

## 2021-12-07 DIAGNOSIS — M12811 Other specific arthropathies, not elsewhere classified, right shoulder: Secondary | ICD-10-CM | POA: Diagnosis present

## 2021-12-07 DIAGNOSIS — G8929 Other chronic pain: Secondary | ICD-10-CM | POA: Diagnosis present

## 2021-12-07 DIAGNOSIS — M25511 Pain in right shoulder: Secondary | ICD-10-CM | POA: Diagnosis present

## 2021-12-07 MED ORDER — DIAZEPAM 5 MG PO TABS
10.0000 mg | ORAL_TABLET | ORAL | Status: AC
  Administered 2021-12-07: 10 mg via ORAL

## 2021-12-07 MED ORDER — DEXAMETHASONE SODIUM PHOSPHATE 10 MG/ML IJ SOLN
10.0000 mg | Freq: Once | INTRAMUSCULAR | Status: AC
  Administered 2021-12-07: 10 mg

## 2021-12-07 MED ORDER — IOHEXOL 180 MG/ML  SOLN
10.0000 mL | Freq: Once | INTRAMUSCULAR | Status: AC
  Administered 2021-12-07: 10 mL via INTRA_ARTICULAR
  Filled 2021-12-07: qty 20

## 2021-12-07 MED ORDER — ROPIVACAINE HCL 2 MG/ML IJ SOLN
INTRAMUSCULAR | Status: AC
  Filled 2021-12-07: qty 20

## 2021-12-07 MED ORDER — DIAZEPAM 5 MG PO TABS
ORAL_TABLET | ORAL | Status: AC
  Filled 2021-12-07: qty 2

## 2021-12-07 MED ORDER — LIDOCAINE HCL 2 % IJ SOLN
INTRAMUSCULAR | Status: AC
  Filled 2021-12-07: qty 20

## 2021-12-07 MED ORDER — DEXAMETHASONE SODIUM PHOSPHATE 10 MG/ML IJ SOLN
INTRAMUSCULAR | Status: AC
  Filled 2021-12-07: qty 1

## 2021-12-07 MED ORDER — ROPIVACAINE HCL 2 MG/ML IJ SOLN
4.0000 mL | Freq: Once | INTRAMUSCULAR | Status: AC
  Administered 2021-12-07: 4 mL via INTRA_ARTICULAR

## 2021-12-07 MED ORDER — LIDOCAINE HCL 2 % IJ SOLN
20.0000 mL | Freq: Once | INTRAMUSCULAR | Status: AC
  Administered 2021-12-07: 400 mg

## 2021-12-07 NOTE — Progress Notes (Signed)
Safety precautions to be maintained throughout the outpatient stay will include: orient to surroundings, keep bed in low position, maintain call bell within reach at all times, provide assistance with transfer out of bed and ambulation.  

## 2021-12-07 NOTE — Progress Notes (Signed)
PROVIDER NOTE: Interpretation of information contained herein should be left to medically-trained personnel. Specific patient instructions are provided elsewhere under "Patient Instructions" section of medical record. This document was created in part using STT-dictation technology, any transcriptional errors that may result from this process are unintentional.  Patient: Nathan Dean Type: Established DOB: 01-Jul-1955 MRN: [id number] PCP: [facility]  Service: Procedure DOS: 12/07/2021 Setting: Ambulatory Location: Ambulatory outpatient facility Delivery: Face-to-face Provider: Gillis Santa, MD Specialty: Interventional Pain Management Specialty designation: 09 Location: Outpatient facility Ref. Prov.: [place], [place]    Primary Reason for Visit: Interventional Pain Management Treatment. CC: Shoulder Pain (Bilat, RIGHT is worse)   Procedure:           Type: Suprascapular nerve Radiofrequency Ablation (RFA) #1  Laterality: Right  Level: Superior to the scapular spine, in the lateral aspect of the supraspinatus fossa (Suprescapular notch).  Imaging: Fluoroscopic guidance Anesthesia: Local anesthesia (1-2% Lidocaine) Anxiolysis: Oral (Valium 10 mg) Sedation: minimal anxiolysis.  DOS: 12/07/2021  Performed by: Gillis Santa, MD  Purpose: Therapeutic Indications: Shoulder pain severe enough to impact quality of life or function. Indications: 1. Chronic right shoulder pain   2. S/P right rotator cuff repair   3. Right rotator cuff tear arthropathy   4. Chronic pain syndrome    Nathan Dean has been dealing with the above chronic pain for longer than three months and has either failed to respond, was unable to tolerate, or simply did not get enough benefit from other more conservative therapies including, but not limited to: 1. Over-the-counter medications 2. Anti-inflammatory medications 3. Muscle relaxants 4. Membrane stabilizers 5. Opioids 6. Physical  therapy and/or chiropractic manipulation 7. Modalities (Heat, ice, etc.) 8. Invasive techniques such as nerve blocks. Nathan Dean has attained more than 50% relief of the pain from a series of diagnostic injections conducted in separate occasions.  Pain Score: Pre-procedure: 10-Worst pain ever/10 Post-procedure: 3 /10    Position / Prep / Materials:  Position: Prone  Prep solution: DuraPrep (Iodine Povacrylex [0.7% available iodine] and Isopropyl Alcohol, 74% w/w) Prep Area: Entire shoulder area.  This includes the lateral and posterior aspect of the neck, lateral and posterior aspect of the upper third of the upper arm, from the axilla to the spine in the upper back, down to the lower border of the scapula. Materials:  Tray:  RFA (Radiofrequency) tray Needle(s):  Type: RFA (Teflon-coated radiofrequency ablation needles)  Gauge (G): 22  Length: Regular (10cm) Qty: 1  Pre-op H&P Assessment:  Nathan Dean is a 66 y.o. (year old), male patient, seen today for interventional treatment. He  has a past surgical history that includes Replacement total knee bilateral (2014); Carpal tunnel release (Right, 12/28/2016); Carpal tunnel release (Left); Tonsillectomy; Cataract extraction w/ intraocular lens implant (Bilateral); Back surgery; Back surgery; and Shoulder open rotator cuff repair (Right, 08/07/2019). Nathan Dean has a current medication list which includes the following prescription(s): amlodipine, one touch ultra system kit, bupropion, carvedilol, doxycycline, duloxetine, gabapentin, glucose blood, insulin aspart, insulin pen needle, loratadine, losartan, melatonin, metformin, omeprazole, one touch lancets, rosuvastatin, senna-docusate, tizanidine, duloxetine, and meloxicam. His primarily concern today is the Shoulder Pain (Bilat, RIGHT is worse)  Initial Vital Signs:  Pulse/HCG Rate: 84  Temp: (!) 97.2 F (36.2 C) Resp: 16 BP: (!) 111/57 SpO2: 97 %  BMI: Estimated body mass index is  39.11 kg/m as calculated from the following:   Height as of this encounter: 6' 9"  (2.057 m).   Weight as of this encounter:  365 lb (165.6 kg).  Risk Assessment: Allergies: Reviewed. He is allergic to penicillins, succinylcholine chloride, keflex [cephalexin], and sulfa antibiotics.  Allergy Precautions: None required Coagulopathies: Reviewed. None identified.  Blood-thinner therapy: None at this time Active Infection(s): Reviewed. None identified. Nathan Dean is afebrile  Site Confirmation: Nathan Dean was asked to confirm the procedure and laterality before marking the site Procedure checklist: Completed Consent: Before the procedure and under the influence of no sedative(s), amnesic(s), or anxiolytics, the patient was informed of the treatment options, risks and possible complications. To fulfill our ethical and legal obligations, as recommended by the American Medical Association's Code of Ethics, I have informed the patient of my clinical impression; the nature and purpose of the treatment or procedure; the risks, benefits, and possible complications of the intervention; the alternatives, including doing nothing; the risk(s) and benefit(s) of the alternative treatment(s) or procedure(s); and the risk(s) and benefit(s) of doing nothing. The patient was provided information about the general risks and possible complications associated with the procedure. These may include, but are not limited to: failure to achieve desired goals, infection, bleeding, organ or nerve damage, allergic reactions, paralysis, and death. In addition, the patient was informed of those risks and complications associated to the procedure, such as failure to decrease pain; infection; bleeding; organ or nerve damage with subsequent damage to sensory, motor, and/or autonomic systems, resulting in permanent pain, numbness, and/or weakness of one or several areas of the body; allergic reactions; (i.e.: anaphylactic reaction);  and/or death. Furthermore, the patient was informed of those risks and complications associated with the medications. These include, but are not limited to: allergic reactions (i.e.: anaphylactic or anaphylactoid reaction(s)); adrenal axis suppression; blood sugar elevation that in diabetics may result in ketoacidosis or comma; water retention that in patients with history of congestive heart failure may result in shortness of breath, pulmonary edema, and decompensation with resultant heart failure; weight gain; swelling or edema; medication-induced neural toxicity; particulate matter embolism and blood vessel occlusion with resultant organ, and/or nervous system infarction; and/or aseptic necrosis of one or more joints. Finally, the patient was informed that Medicine is not an exact science; therefore, there is also the possibility of unforeseen or unpredictable risks and/or possible complications that may result in a catastrophic outcome. The patient indicated having understood very clearly. We have given the patient no guarantees and we have made no promises. Enough time was given to the patient to ask questions, all of which were answered to the patient's satisfaction. Mr. Garriga has indicated that he wanted to continue with the procedure. Attestation: I, the ordering provider, attest that I have discussed with the patient the benefits, risks, side-effects, alternatives, likelihood of achieving goals, and potential problems during recovery for the procedure that I have provided informed consent. Date  Time: 12/07/2021  7:47 AM  Pre-Procedure Preparation:  Monitoring: As per clinic protocol. Respiration, ETCO2, SpO2, BP, heart rate and rhythm monitor placed and checked for adequate function Safety Precautions: Patient was assessed for positional comfort and pressure points before starting the procedure. Time-out: I initiated and conducted the "Time-out" before starting the procedure, as per protocol.  The patient was asked to participate by confirming the accuracy of the "Time Out" information. Verification of the correct person, site, and procedure were performed and confirmed by me, the nursing staff, and the patient. "Time-out" conducted as per Joint Commission's Universal Protocol (UP.01.01.01). Time: 0829  Description/Narrative of Procedure:          Target: Suprascapular nerve as  it passes thru the lower portion of the suprascapular notch. Location: Suprascapular notch Region: Shoulder, suprascapular Approach: Percutaneous   Rationale (medical necessity): procedure needed and proper for the diagnosis and/or treatment of the patient's medical symptoms and needs. Procedural Technique Safety Precautions: Aspiration looking for blood return was conducted prior to all injections. At no point did we inject any substances, as a needle was being advanced. No attempts were made at seeking any paresthesias. Safe injection practices and needle disposal techniques used. Medications properly checked for expiration dates. SDV (single dose vial) medications used. Description of the Procedure: Protocol guidelines were followed. The patient was placed in position over the procedure table. The target area was identified and the area prepped in the usual manner. The skin and muscle were infiltrated with local anesthetic. Appropriate amount of time allowed to pass for local anesthetics to take effect. Radiofrequency needles were introduced to the target area using fluoroscopic guidance. Using the NeuroTherm NT1100 Radiofrequency Generator, sensory stimulation using 50 Hz was used to locate & identify the nerve, making sure that the needle was positioned such that there was no sensory stimulation below 0.3 V or above 0.7 V. Stimulation using 2 Hz was used to evaluate the motor component. Care was taken not to lesion any nerves that demonstrated motor stimulation of the lower extremities at an output of less than 2.5  times that of the sensory threshold, or a maximum of 2.0 V. Once satisfactory placement of the needles was achieved, the numbing solution was slowly injected after negative aspiration. After waiting for at least 2 minutes, the ablation was performed at 80 degrees C for 60 seconds, using regular Radiofrequency settings. Once the procedure was completed, the needles were then removed and the area cleansed, making sure to leave some of the prepping solution back to take advantage of its long term bactericidal properties. Intra-operative Compliance: Compliant   Vitals:   12/07/21 0805 12/07/21 0829 12/07/21 0834 12/07/21 0839  BP: (!) 111/57 (!) 141/78 135/80 (!) 141/82  Pulse: 84 80 81 83  Resp: 16 12 13 15   Temp: (!) 97.2 F (36.2 C)     TempSrc: Temporal     SpO2: 97% 95% 97% 96%  Weight: (!) 365 lb (165.6 kg)     Height: 6' 9"  (2.057 m)       Start Time: 0829 hrs. End Time: 0838 hrs.  Materials & Medications:  Needle(s) Type: Teflon-coated, curved tip, Radiofrequency needle(s) Gauge: 22G Length: 10cm Medication(s): Please see orders for medications and dosing details.  4 cc solution made of 3 cc of 0.2% ropivacaine, 1cc of Decadron 10 mg/cc.  4 cc injected after sensorimotor testing, prior to lesioning for RIGHT SSN  Imaging Guidance (Non-Spinal):          Type of Imaging Technique: Fluoroscopy Guidance (Non-Spinal) Indication(s): Assistance in needle guidance and placement for procedures requiring needle placement in or near specific anatomical locations not easily accessible without such assistance. Exposure Time: Please see nurses notes. Contrast: Before injecting any contrast, we confirmed that the patient did not have an allergy to iodine, shellfish, or radiological contrast. Once satisfactory needle placement was completed at the desired level, radiological contrast was injected. Contrast injected under live fluoroscopy. No contrast complications. See chart for type and volume of  contrast used. Fluoroscopic Guidance: I was personally present during the use of fluoroscopy. "Tunnel Vision Technique" used to obtain the best possible view of the target area. Parallax error corrected before commencing the procedure. "Direction-depth-direction" technique used  to introduce the needle under continuous pulsed fluoroscopy. Once target was reached, antero-posterior, oblique, and lateral fluoroscopic projection used confirm needle placement in all planes. Images permanently stored in EMR. Interpretation: I personally interpreted the imaging intraoperatively. Adequate needle placement confirmed in multiple planes. Appropriate spread of contrast into desired area was observed. No evidence of afferent or efferent intravascular uptake. Permanent images saved into the patient's record.  Antibiotic Prophylaxis:   Anti-infectives (From admission, onward)    None      Indication(s): None identified  Post-operative Assessment:  Post-procedure Vital Signs:  Pulse/HCG Rate: 83 (nsr)  Temp:  (!) 97.2 F (36.2 C) Resp: 15 BP:  (!) 141/82 SpO2: 96 %  EBL: None  Complications: No immediate post-treatment complications observed by team, or reported by patient.  Note: The patient tolerated the entire procedure well. A repeat set of vitals were taken after the procedure and the patient was kept under observation following institutional policy, for this type of procedure. Post-procedural neurological assessment was performed, showing return to baseline, prior to discharge. The patient was provided with post-procedure discharge instructions, including a section on how to identify potential problems. Should any problems arise concerning this procedure, the patient was given instructions to immediately contact us, at any time, without hesitation. In any case, we plan to contact the patient by telephone for a follow-up status report regarding this interventional procedure.  Comments:  No  additional relevant information.  Plan of Care  Orders:  Orders Placed This Encounter  Procedures   DG PAIN [facility] C-ARM 1-60 MIN NO REPORT    Intraoperative interpretation by procedural physician at [place].    Standing Status:   Standing    Number of Occurrences:   1    Order Specific Question:   Reason for exam:    Answer:   Assistance in needle guidance and placement for procedures requiring needle placement in or near specific anatomical locations not easily accessible without such assistance.    Medications ordered for procedure: Meds ordered this encounter  Medications   iohexol (OMNIPAQUE) 180 MG/ML injection 10 mL    Must be Myelogram-compatible. If not available, you may substitute with a water-soluble, non-ionic, hypoallergenic, myelogram-compatible radiological contrast medium.   lidocaine (XYLOCAINE) 2 % (with pres) injection 400 mg   diazepam (VALIUM) tablet 10 mg    Make sure Flumazenil is available in the pyxis when using this medication. If oversedation occurs, administer 0.2 mg IV over 15 sec. If after 45 sec no response, administer 0.2 mg again over 1 min; may repeat at 1 min intervals; not to exceed 4 doses (1 mg)   dexamethasone (DECADRON) injection 10 mg   ropivacaine (PF) 2 mg/mL (0.2%) (NAROPIN) injection 4 mL   Medications administered: We administered iohexol, lidocaine, diazepam, dexamethasone, and ropivacaine (PF) 2 mg/mL (0.2%).  See the medical record for exact dosing, route, and time of administration.  Follow-up plan:   Return in about 6 weeks (around 01/18/2022) for Post Procedure Evaluation, virtual.       Right SSNB #1 08/10/21, #2 5/3/23l pulsed RFA 12/07/21    Recent Visits Date Type Provider Dept  10/14/21 Office Visit Gillis Santa, MD [facility]  09/16/21 Procedure visit Gillis Santa, MD [facility]  Showing recent visits within past 90 days and meeting all other requirements Today's Visits Date Type  Provider Dept  12/07/21 Procedure visit Gillis Santa, MD [facility]  Showing today's visits and meeting all other requirements Future Appointments Date  Type Provider Dept  01/20/22 Appointment Gillis Santa, MD [facility]  Showing future appointments within next 90 days and meeting all other requirements  Disposition: Discharge home  Discharge (Date  Time): 12/07/2021; 0843 hrs.   Primary Care Physician: Scot Dock, [place] Primary Care Location: [facility] Note by: Gillis Santa, MD Date: 12/07/2021; Time: 8:47 AM  Disclaimer:  Medicine is not an exact science. The only guarantee in medicine is that nothing is guaranteed. It is important to note that the decision to proceed with this intervention was based on the information collected from the patient. The Data and conclusions were drawn from the patient's questionnaire, the interview, and the physical examination. Because the information was provided in large part by the patient, it cannot be guaranteed that it has not been purposely or unconsciously manipulated. Every effort has been made to obtain as much relevant data as possible for this evaluation. It is important to note that the conclusions that lead to this procedure are derived in large part from the available data. Always take into account that the treatment will also be dependent on availability of resources and existing treatment guidelines, considered by other Pain Management Practitioners as being common knowledge and practice, at the time of the intervention. For Medico-Legal purposes, it is also important to point out that variation in procedural techniques and pharmacological choices are the acceptable norm. The indications, contraindications, technique, and results of the above procedure should only be interpreted and judged by a Board-Certified Interventional Pain Specialist with extensive familiarity and expertise in the same  exact procedure and technique.

## 2021-12-07 NOTE — Patient Instructions (Signed)

## 2021-12-09 ENCOUNTER — Telehealth: Payer: Self-pay

## 2021-12-09 ENCOUNTER — Telehealth: Payer: Self-pay | Admitting: Student in an Organized Health Care Education/Training Program

## 2021-12-09 NOTE — Telephone Encounter (Signed)
Patient is still having pain in his right shoulder, wants to see what else can be done. Please give patient a call. Thanks

## 2021-12-09 NOTE — Telephone Encounter (Signed)
Post procedure phone call.  lm 

## 2021-12-10 ENCOUNTER — Telehealth: Payer: Self-pay | Admitting: Student in an Organized Health Care Education/Training Program

## 2021-12-10 NOTE — Telephone Encounter (Signed)
Pt stated that he is still having pain on right back side and right hand also. Pt has been using heating pad. PT wants to know what else can be done to help with the pain that he is experiencing. Please give patient a call. Thanks

## 2021-12-10 NOTE — Telephone Encounter (Signed)
Called patient and informed him that it is normal for him to have pain after the procedure that he had. He needs to give it more time. Instructed to call back if needed. Patient with understanding.

## 2021-12-11 ENCOUNTER — Ambulatory Visit: Payer: Medicare Other | Admitting: Physical Therapy

## 2021-12-14 ENCOUNTER — Ambulatory Visit: Payer: Medicare Other

## 2021-12-18 ENCOUNTER — Ambulatory Visit: Payer: Medicare Other

## 2021-12-21 ENCOUNTER — Ambulatory Visit: Payer: Medicare Other

## 2021-12-25 ENCOUNTER — Ambulatory Visit: Payer: Medicare Other

## 2021-12-28 ENCOUNTER — Ambulatory Visit: Payer: Medicare Other

## 2022-01-01 ENCOUNTER — Ambulatory Visit: Payer: Medicare Other | Admitting: Physical Therapy

## 2022-01-04 ENCOUNTER — Ambulatory Visit: Payer: Medicare Other | Admitting: Physical Therapy

## 2022-01-08 ENCOUNTER — Ambulatory Visit: Payer: Medicare Other | Admitting: Physical Therapy

## 2022-01-15 ENCOUNTER — Ambulatory Visit: Payer: Medicare Other | Admitting: Physical Therapy

## 2022-01-19 ENCOUNTER — Encounter: Payer: Self-pay | Admitting: Student in an Organized Health Care Education/Training Program

## 2022-01-20 ENCOUNTER — Ambulatory Visit
Payer: Medicare Other | Attending: Student in an Organized Health Care Education/Training Program | Admitting: Student in an Organized Health Care Education/Training Program

## 2022-01-20 DIAGNOSIS — M12811 Other specific arthropathies, not elsewhere classified, right shoulder: Secondary | ICD-10-CM

## 2022-01-20 DIAGNOSIS — M75101 Unspecified rotator cuff tear or rupture of right shoulder, not specified as traumatic: Secondary | ICD-10-CM | POA: Diagnosis not present

## 2022-01-20 DIAGNOSIS — M25511 Pain in right shoulder: Secondary | ICD-10-CM | POA: Diagnosis not present

## 2022-01-20 DIAGNOSIS — G894 Chronic pain syndrome: Secondary | ICD-10-CM

## 2022-01-20 DIAGNOSIS — Z9889 Other specified postprocedural states: Secondary | ICD-10-CM | POA: Diagnosis not present

## 2022-01-20 DIAGNOSIS — G8929 Other chronic pain: Secondary | ICD-10-CM

## 2022-01-20 MED ORDER — TIZANIDINE HCL 4 MG PO TABS: 4.0000 mg | ORAL_TABLET | Freq: Every day | ORAL | 1 refills | Status: DC

## 2022-01-20 NOTE — Progress Notes (Signed)
Patient: Nathan Dean  Service Category: E/M  Provider: Gillis Santa, MD  DOB: 1955/08/06  DOS: 01/20/2022  Location: Office  MRN: [id number]  Setting: Ambulatory outpatient  Referring Provider: Emily Filbert*  Type: Established Patient  Specialty: Interventional Pain Management  PCP: [facility]  Location: Remote location  Delivery: TeleHealth     Virtual Encounter - Pain Management PROVIDER NOTE: Information contained herein reflects review and annotations entered in association with encounter. Interpretation of such information and data should be left to medically-trained personnel. Information provided to patient can be located elsewhere in the medical record under "Patient Instructions". Document created using STT-dictation technology, any transcriptional errors that may result from process are unintentional.    Contact & Pharmacy Preferred: [phone] Home: [phone] (home) Mobile: [phone] (mobile) E-mail: [email or web address]  [id number] [place]  - [id number]: [phone]: [phone]  Pre-screening  Mr. STomarooffered "in-person" vs "virtual" encounter. He indicated preferring virtual for this encounter.   Reason COVID-19*  Social distancing based on CDC and AMA recommendations.   I contacted GBertram Millardon 01/20/2022 via telephone.      I clearly identified myself as BGillis Santa MD. I verified that I was speaking with the correct person using two identifiers (Name: GTaiga Lupinacci and date of birth: 2Oct 25, 1957.  Consent I sought verbal advanced consent from GBertram Millardfor virtual visit interactions. I informed Mr. SKinsellaof possible security and privacy concerns, risks, and limitations associated with providing "not-in-person" medical evaluation and management services. I also informed Mr. SDelarochaof the availability of "in-person" appointments. Finally, I informed him  that there would be a charge for the virtual visit and that he could be  personally, fully or partially, financially responsible for it. Mr. SGlendinningexpressed understanding and agreed to proceed.   Historic Elements   Mr. GEliyohu Classis a 66y.o. year old, male patient evaluated today after our last contact on 12/10/2021. Mr. SCulbertson has a past medical history of Complication of anesthesia, [place], GERD (gastroesophageal reflux disease), Hyperlipidemia, Hypertension, Obesity, and Sleep apnea. He also  has a past surgical history that includes Replacement total knee bilateral (2014); Carpal tunnel release (Right, 12/28/2016); Carpal tunnel release (Left); Tonsillectomy; Cataract extraction w/ intraocular lens implant (Bilateral); Back surgery; Back surgery; and Shoulder open rotator cuff repair (Right, 08/07/2019). Mr. SHolberghas a current medication list which includes the following prescription(s): one touch ultra system kit, bupropion, carvedilol, doxycycline, duloxetine, gabapentin, glucose blood, insulin aspart, insulin pen needle, loratadine, losartan, melatonin, metformin, omeprazole, one touch lancets, rosuvastatin, senna-docusate, and tizanidine. He  reports that he quit smoking about 25 years ago. His smoking use included cigarettes. He has a 40.00 pack-year smoking history. He has never used smokeless tobacco. He reports current alcohol use. He reports that he does not use drugs. Mr. SVenardis allergic to penicillins, succinylcholine chloride, keflex [cephalexin], and sulfa antibiotics.   HPI  Today, he is being contacted for a post-procedure assessment.   Post-procedure evaluation   Type: Suprascapular nerve Radiofrequency Ablation (RFA) #1  Laterality: Right  Level: Superior to the scapular spine, in the lateral aspect of the supraspinatus fossa (Suprescapular notch).  Imaging: Fluoroscopic guidance Anesthesia: Local anesthesia (1-2% Lidocaine) Anxiolysis: Oral (Valium 10  mg) Sedation: minimal anxiolysis.  DOS: 12/07/2021  Performed by: BGillis Santa MD  Purpose: Therapeutic Indications: Shoulder pain severe enough to impact quality of life or function. Indications: 1. Chronic right shoulder  pain   2. S/P right rotator cuff repair   3. Right rotator cuff tear arthropathy   4. Chronic pain syndrome    Mr. Wiegel has been dealing with the above chronic pain for longer than three months and has either failed to respond, was unable to tolerate, or simply did not get enough benefit from other more conservative therapies including, but not limited to: 1. Over-the-counter medications 2. Anti-inflammatory medications 3. Muscle relaxants 4. Membrane stabilizers 5. Opioids 6. Physical therapy and/or chiropractic manipulation 7. Modalities (Heat, ice, etc.) 8. Invasive techniques such as nerve blocks. Mr. Minichiello has attained more than 50% relief of the pain from a series of diagnostic injections conducted in separate occasions.  Pain Score: Pre-procedure: 10-Worst pain ever/10 Post-procedure: 3 /10     Effectiveness:  Initial hour after procedure: 100 %  Subsequent 4-6 hours post-procedure: 100 %  Analgesia past initial 6 hours: 75 % (current.)  Ongoing improvement:  Analgesic:  75% Function: Back to baseline ROM: Back to baseline   Laboratory Chemistry Profile   Renal Lab Results  Component Value Date   BUN 26 (H) 01/18/2020   CREATININE 0.82 01/18/2020   BCR 28 (H) 07/20/2017   GFRAA >60 01/18/2020   GFRNONAA >60 01/18/2020    Hepatic Lab Results  Component Value Date   AST 22 08/11/2019   ALT 18 08/11/2019   ALBUMIN 3.5 08/11/2019   ALKPHOS 68 08/11/2019    Electrolytes Lab Results  Component Value Date   NA 137 01/18/2020   K 4.0 01/18/2020   CL 101 01/18/2020   CALCIUM 9.3 01/18/2020   MG 2.3 08/17/2019    Bone No results found for: "VD25OH", "VD125OH2TOT", "JG2836OQ9", "UT6546TK3", "25OHVITD1", "25OHVITD2", "25OHVITD3",  "TESTOFREE", "TESTOSTERONE"  Inflammation (CRP: Acute Phase) (ESR: Chronic Phase) Lab Results  Component Value Date   ESRSEDRATE 77 (H) 08/16/2019   LATICACIDVEN 1.6 08/11/2019         Note: Above Lab results reviewed.  Imaging  DG PAIN CLINIC C-ARM 1-60 MIN NO REPORT Fluoro was used, but no Radiologist interpretation will be provided.  Please refer to "NOTES" tab for provider progress note.  Assessment  The primary encounter diagnosis was Chronic right shoulder pain. Diagnoses of S/P right rotator cuff repair, Right rotator cuff tear arthropathy, and Chronic pain syndrome were also pertinent to this visit.  Plan of Care   Mr. Nethaniel Mattie has a current medication list which includes the following long-term medication(s): carvedilol, duloxetine, gabapentin, loratadine, losartan, and rosuvastatin.  Pharmacotherapy (Medications Ordered): Meds ordered this encounter  Medications   tiZANidine (ZANAFLEX) 4 MG tablet    Sig: Take 1-1.5 tablets (4-6 mg total) by mouth at bedtime.    Dispense:  90 tablet    Refill:  1    Do not place this medication, or any other prescription from our practice, on "Automatic Refill". Patient may have prescription filled one day early if pharmacy is closed on scheduled refill date.   Orders:  No orders of the defined types were placed in this encounter.  Follow-up plan:   Return in about 4 months (around 05/22/2022) for follow up for right shoulder.     Right SSNB #1 08/10/21, #2 5/3/23l pulsed RFA 12/07/21     Recent Visits Date Type Provider Dept  12/07/21 Procedure visit Gillis Santa, MD [facility]  Showing recent visits within past 90 days and meeting all other requirements Today's Visits Date Type Provider Dept  01/20/22 Office Visit Gillis Santa, MD [facility]  Showing today's visits and meeting all other requirements Future Appointments No visits were found meeting these conditions. Showing future appointments  within next 90 days and meeting all other requirements  I discussed the assessment and treatment plan with the patient. The patient was provided an opportunity to ask questions and all were answered. The patient agreed with the plan and demonstrated an understanding of the instructions.  Patient advised to call back or seek an in-person evaluation if the symptoms or condition worsens.  Duration of encounter: 36mnutes.  Note by: BGillis Santa MD Date: 01/20/2022; Time: 10:16 AM

## 2022-01-22 ENCOUNTER — Ambulatory Visit: Payer: Medicare Other | Admitting: Physical Therapy

## 2022-01-29 ENCOUNTER — Ambulatory Visit: Payer: Medicare Other | Admitting: Physical Therapy

## 2022-02-02 ENCOUNTER — Ambulatory Visit
Payer: Medicare Other | Attending: Student in an Organized Health Care Education/Training Program | Admitting: Student in an Organized Health Care Education/Training Program

## 2022-02-02 DIAGNOSIS — M25512 Pain in left shoulder: Secondary | ICD-10-CM | POA: Diagnosis not present

## 2022-02-02 DIAGNOSIS — G894 Chronic pain syndrome: Secondary | ICD-10-CM

## 2022-02-02 DIAGNOSIS — G5682 Other specified mononeuropathies of left upper limb: Secondary | ICD-10-CM | POA: Diagnosis not present

## 2022-02-02 NOTE — Progress Notes (Signed)
Patient: Nathan Dean  Service Category: E/M  Provider: Gillis Santa, MD  DOB: 08/01/1955  DOS: 02/02/2022  Location: Office  MRN: [id number]  Setting: Ambulatory outpatient  Referring Provider: Emily Dean*  Type: Established Patient  Specialty: Interventional Pain Management  [facility]P: [facility]  Location: Remote location  Delivery: TeleHealth     Virtual Encounter - Pain Management PROVIDER NOTE: Information contained herein reflects review and annotations entered in association with encounter. Interpretation of such information and data should be left to medically-trained personnel. Information provided to patient can be located elsewhere in the medical record under "Patient Instructions". Document created using STT-dictation technology, any transcriptional errors that may result from process are unintentional.    Contact & Pharmacy Preferred: [phone] Home: [phone] (home) Mobile: [phone] (mobile) E-mail: [email or web address]  [id number] [place] - [id number]: [phone]: [phone]  Pre-screening  Nathan Dean "in-person" vs "virtual" encounter. He indicated preferring virtual for this encounter.   Reason COVID-19*  Social distancing based on CDC and AMA recommendations.   I contacted Nathan Millardon 02/02/2022 via telephone.      I clearly identified myself as BGillis Santa MD. I verified that I was speaking with the correct person using two identifiers (Name: Nathan Dean and date of birth: [birth year].  Consent I sought verbal advanced consent from Nathan Dean virtual visit interactions. I informed Nathan Dean possible security and privacy concerns, risks, and limitations associated with providing "not-in-person" medical evaluation and management services. I also informed Nathan Dean of "in-person" appointments. Finally, I informed him  that there would be a charge for the virtual visit and that he could be  personally, fully or partially, financially responsible for it. Nathan Dean understanding and agreed to proceed.   Historic Elements   Mr. GJeiden Daughtridgeis a 66y.o. year old, male patient evaluated today after our last contact on 01/20/2022. [name] has a past medical history of Complication of anesthesia, [place], GERD (gastroesophageal reflux disease), Hyperlipidemia, Hypertension, Obesity, and Sleep apnea. He also  has a past surgical history that includes Replacement total knee bilateral (2014); Carpal tunnel release (Right, 12/28/2016); Carpal tunnel release (Left); Tonsillectomy; Cataract extraction w/ intraocular lens implant (Bilateral); Back surgery; Back surgery; and Shoulder open rotator cuff repair (Right, 08/07/2019). Nathan Dean a current medication list which includes the following prescription(s): one touch ultra system kit, bupropion, carvedilol, doxycycline, duloxetine, gabapentin, glucose blood, insulin aspart, insulin pen needle, loratadine, losartan, melatonin, metformin, omeprazole, one touch lancets, rosuvastatin, senna-docusate, and tizanidine. He  reports that he quit smoking about 25 years ago. His smoking use included cigarettes. He has a 40.00 pack-year smoking history. He has never used smokeless tobacco. He reports current alcohol use. He reports that he does not use drugs. Nathan Dean allergic to penicillins, succinylcholine chloride, keflex [cephalexin], and sulfa antibiotics.   HPI  Today, he is being contacted for new problems.  Left shoulder pain, worse around clavicle Status post right suprascapular nerve RFA 12/07/2021 that is still providing him with significant pain relief for his right shoulder pain.  He also has slight improvement in his right shoulder range of motion.  He is endorsing right elbow pain as well as numbness and tingling at times of his left forearm.   This could be consistent with ulnar neuropathy.   Laboratory Chemistry Profile   Renal Lab Results  Component  Value Date   BUN 26 (H) 01/18/2020   CREATININE 0.82 01/18/2020   BCR 28 (H) 07/20/2017   GFRAA >60 01/18/2020   GFRNONAA >60 01/18/2020    Hepatic Lab Results  Component Value Date   AST 22 08/11/2019   ALT 18 08/11/2019   ALBUMIN 3.5 08/11/2019   ALKPHOS 68 08/11/2019    Electrolytes Lab Results  Component Value Date   NA 137 01/18/2020   K 4.0 01/18/2020   CL 101 01/18/2020   CALCIUM 9.3 01/18/2020   MG 2.3 08/17/2019    Bone No results found for: "VD25OH", "VD125OH2TOT", "BJ4782NF6", "OZ3086VH8", "25OHVITD1", "25OHVITD2", "25OHVITD3", "TESTOFREE", "TESTOSTERONE"  Inflammation (CRP: Acute Phase) (ESR: Chronic Phase) Lab Results  Component Value Date   ESRSEDRATE 77 (H) 08/16/2019   LATICACIDVEN 1.6 08/11/2019         Note: Above Lab results reviewed.   Assessment  The primary encounter diagnosis was Localized pain of left shoulder joint. Diagnoses of Disorder of left suprascapular nerve and Chronic pain syndrome were also pertinent to this visit.  Plan of Care  1. Localized pain of left shoulder joint - SUPRASCAPULAR NERVE BLOCK; Future  2. Disorder of left suprascapular nerve - SUPRASCAPULAR NERVE BLOCK; Future  3. Chronic pain syndrome - SUPRASCAPULAR NERVE BLOCK; Future  I will evaluate patient's right elbow pain when he comes for his procedure.  Physical exam may help delineate whether this is ulnar nerve related or musculoskeletal. Continue to monitor right shoulder.  Overall receiving good pain relief after his right suprascapular nerve RFA 12/07/2021.  Orders:  Orders Placed This Encounter  Procedures   SUPRASCAPULAR NERVE BLOCK    For shoulder pain.    Standing Status:   Future    Standing Expiration Date:   05/04/2022    Scheduling Instructions:     Purpose: Diagnostic     Laterality: LEFT     Level(s): Suprascapular notch      Sedation: Patient's choice.     Scheduling Timeframe: As permitted by the schedule    Order Specific Question:   Where will this procedure be performed?    Answer:   [facility]   Follow-up plan:   Return in about 2 weeks (around 02/16/2022) for LEFT SSNB (also discuss ulnar neuropathy), in clinic [facility].     Right SSNB #1 08/10/21, #2 5/3/23l pulsed RFA 12/07/21      Recent Visits Date Type Provider Dept  01/20/22 Office Visit Nathan Santa, MD [facility]  12/07/21 Procedure visit Nathan Santa, MD [facility]  Showing recent visits within past 90 days and meeting all other requirements Today's Visits Date Type Provider Dept  02/02/22 Office Visit Nathan Santa, MD [facility]  Showing today's visits and meeting all other requirements Future Appointments No visits were found meeting these conditions. Showing future appointments within next 90 days and meeting all other requirements  I discussed the assessment and treatment plan with the patient. The patient was provided an opportunity to ask questions and all were answered. The patient agreed with the plan and demonstrated an understanding of the instructions.  Patient advised to call back or seek an in-person evaluation if the symptoms or condition worsens.  Duration of encounter: 44mnutes.  Note by: BGillis Santa MD Date: 02/02/2022; Time: 3:40 PM

## 2022-02-05 ENCOUNTER — Ambulatory Visit: Payer: Medicare Other | Admitting: Physical Therapy

## 2022-02-12 ENCOUNTER — Ambulatory Visit: Payer: Medicare Other | Admitting: Physical Therapy

## 2022-02-17 ENCOUNTER — Ambulatory Visit
Admission: RE | Admit: 2022-02-17 | Discharge: 2022-02-17 | Disposition: A | Discharge status: Home / Self Care | Payer: Medicare Other | Source: Ambulatory Visit | Attending: Student in an Organized Health Care Education/Training Program | Admitting: Student in an Organized Health Care Education/Training Program

## 2022-02-17 ENCOUNTER — Ambulatory Visit
Payer: Medicare Other | Attending: Student in an Organized Health Care Education/Training Program | Admitting: Student in an Organized Health Care Education/Training Program

## 2022-02-17 ENCOUNTER — Encounter: Payer: Self-pay | Admitting: Student in an Organized Health Care Education/Training Program

## 2022-02-17 VITALS — BP 142/74 | HR 80 | Temp 96.3°F | Resp 18 | Ht >= 80 in | Wt 355.0 lb

## 2022-02-17 DIAGNOSIS — M25521 Pain in right elbow: Secondary | ICD-10-CM | POA: Insufficient documentation

## 2022-02-17 DIAGNOSIS — M25512 Pain in left shoulder: Secondary | ICD-10-CM | POA: Insufficient documentation

## 2022-02-17 DIAGNOSIS — G5682 Other specified mononeuropathies of left upper limb: Secondary | ICD-10-CM | POA: Diagnosis present

## 2022-02-17 DIAGNOSIS — G8929 Other chronic pain: Secondary | ICD-10-CM | POA: Diagnosis present

## 2022-02-17 DIAGNOSIS — G894 Chronic pain syndrome: Secondary | ICD-10-CM | POA: Diagnosis present

## 2022-02-17 MED ORDER — DEXAMETHASONE SODIUM PHOSPHATE 10 MG/ML IJ SOLN
10.0000 mg | Freq: Once | INTRAMUSCULAR | Status: AC
  Administered 2022-02-17: 10 mg
  Filled 2022-02-17: qty 1

## 2022-02-17 MED ORDER — ROPIVACAINE HCL 2 MG/ML IJ SOLN
4.0000 mL | Freq: Once | INTRAMUSCULAR | Status: AC
  Administered 2022-02-17: 4 mL via PERINEURAL
  Filled 2022-02-17: qty 20

## 2022-02-17 MED ORDER — IOHEXOL 180 MG/ML  SOLN
10.0000 mL | Freq: Once | INTRAMUSCULAR | Status: AC
Start: 2022-02-17 — End: 2022-02-17
  Administered 2022-02-17: 10 mL via INTRA_ARTICULAR
  Filled 2022-02-17: qty 20

## 2022-02-17 MED ORDER — LIDOCAINE HCL 2 % IJ SOLN
20.0000 mL | Freq: Once | INTRAMUSCULAR | Status: AC
  Administered 2022-02-17: 100 mg
  Filled 2022-02-17: qty 40

## 2022-02-17 NOTE — Patient Instructions (Signed)

## 2022-02-17 NOTE — Progress Notes (Signed)
PROVIDER NOTE: Interpretation of information contained herein should be left to medically-trained personnel. Specific patient instructions are provided elsewhere under "Patient Instructions" section of medical record. This document was created in part using STT-dictation technology, any transcriptional errors that may result from this process are unintentional.  Patient: Nathan Dean Type: Established DOB: 10/04/1955 MRN: [id number] PCP: [facility]  Service: Procedure DOS: 02/17/2022 Setting: Ambulatory Location: Ambulatory outpatient facility Delivery: Face-to-face Provider: Gillis Santa, MD Specialty: Interventional Pain Management Specialty designation: 09 Location: Outpatient facility Ref. Prov.: [place], [place]    Primary Reason for Visit: Interventional Pain Management Treatment. CC: Shoulder Pain (Left) and Arm Pain (Right elbow)  Procedure:           Type: Suprascapular nerve block #1  Laterality:  Left Level: Superior to scapular spine, lateral to supraspinatus fossa (Suprascapular notch).  Imaging: Fluoroscopic guidance Anesthesia: Local anesthesia (1-2% Lidocaine) Anxiolysis: None                 Sedation: None. DOS: 02/17/2022  Performed by: Gillis Santa, MD  Purpose: Diagnostic/Therapeutic Indications: Shoulder pain, severe enough to impact quality of life and/or function. 1. Localized pain of left shoulder joint   2. Disorder of left suprascapular nerve   3. Chronic pain syndrome   4. Chronic elbow pain, right    NAS-11 score:   Pre-procedure: 8 /10   Post-procedure: 0-No pain/10     Target: Suprascapular nerve Location: midway between the medial border of the scapula and the acromion as it runs through the suprascapular notch. Region: Suprascapular, posterior shoulder  Approach: Percutaneous  Neuroanatomy: The suprascapular nerve is the lateral branch of the superior trunk of the brachial plexus. It receives nerve fibers that  originate in the nerve roots C5 and C6 (and sometimes C4). It is a mixed nerve, meaning that it provides both sensory and motor supply for the suprascapular region. Function: The main function of this nerve is to provide motor innervation for two muscles, the supraspinatus and infraspinatus muscles. They are part of the rotator cuff muscles. In addition, the suprascapular nerve provides a sensory supply to the joints of the scapula (glenohumeral and acromioclavicular joints). Rationale (medical necessity): procedure needed and proper for the diagnosis and/or treatment of the patient's medical symptoms and needs.  Position / Prep / Materials:  Position: Prone Materials:  Tray: Block Needle(s):  Type: Spinal  Gauge (G): 22  Length: 3.5 in.  Qty: 1 Prep solution: DuraPrep (Iodine Povacrylex [0.7% available iodine] and Isopropyl Alcohol, 74% w/w) Prep Area: Entire posterior shoulder area. From upper spine to shoulder proper (upper arm), and from lateral neck to lower tip of shoulder blade.   Pre-op H&P Assessment:  Nathan Dean is a 66 y.o. (year old), male patient, seen today for interventional treatment. He  has a past surgical history that includes Replacement total knee bilateral (2014); Carpal tunnel release (Right, 12/28/2016); Carpal tunnel release (Left); Tonsillectomy; Cataract extraction w/ intraocular lens implant (Bilateral); Back surgery; Back surgery; and Shoulder open rotator cuff repair (Right, 08/07/2019). Nathan Dean has a current medication list which includes the following prescription(s): one touch ultra system kit, bupropion, doxycycline, duloxetine, gabapentin, glucose blood, insulin aspart, insulin pen needle, loratadine, losartan, melatonin, metformin, methocarbamol, omeprazole, one touch lancets, rosuvastatin, senna-docusate, carvedilol, and tizanidine. His primarily concern today is the Shoulder Pain (Left) and Arm Pain (Right elbow)  Initial Vital Signs:  Pulse/HCG Rate:  80ECG Heart Rate: 81 Temp: (!) 96.3 F (35.7 C) Resp: 17 BP: (!) 147/68 SpO2:  100 %  BMI: Estimated body mass index is 38.04 kg/m as calculated from the following:   Height as of this encounter: 6' 9"  (2.057 m).   Weight as of this encounter: 355 lb (161 kg).  Risk Assessment: Allergies: Reviewed. He is allergic to penicillins, succinylcholine chloride, keflex [cephalexin], and sulfa antibiotics.  Allergy Precautions: None required Coagulopathies: Reviewed. None identified.  Blood-thinner therapy: None at this time Active Infection(s): Reviewed. None identified. Nathan Dean is afebrile  Site Confirmation: Nathan Dean was asked to confirm the procedure and laterality before marking the site Procedure checklist: Completed Consent: Before the procedure and under the influence of no sedative(s), amnesic(s), or anxiolytics, the patient was informed of the treatment options, risks and possible complications. To fulfill our ethical and legal obligations, as recommended by the American Medical Association's Code of Ethics, I have informed the patient of my clinical impression; the nature and purpose of the treatment or procedure; the risks, benefits, and possible complications of the intervention; the alternatives, including doing nothing; the risk(s) and benefit(s) of the alternative treatment(s) or procedure(s); and the risk(s) and benefit(s) of doing nothing. The patient was provided information about the general risks and possible complications associated with the procedure. These may include, but are not limited to: failure to achieve desired goals, infection, bleeding, organ or nerve damage, allergic reactions, paralysis, and death. In addition, the patient was informed of those risks and complications associated to the procedure, such as failure to decrease pain; infection; bleeding; organ or nerve damage with subsequent damage to sensory, motor, and/or autonomic systems, resulting in permanent  pain, numbness, and/or weakness of one or several areas of the body; allergic reactions; (i.e.: anaphylactic reaction); and/or death. Furthermore, the patient was informed of those risks and complications associated with the medications. These include, but are not limited to: allergic reactions (i.e.: anaphylactic or anaphylactoid reaction(s)); adrenal axis suppression; blood sugar elevation that in diabetics may result in ketoacidosis or comma; water retention that in patients with history of congestive heart failure may result in shortness of breath, pulmonary edema, and decompensation with resultant heart failure; weight gain; swelling or edema; medication-induced neural toxicity; particulate matter embolism and blood vessel occlusion with resultant organ, and/or nervous system infarction; and/or aseptic necrosis of one or more joints. Finally, the patient was informed that Medicine is not an exact science; therefore, there is also the possibility of unforeseen or unpredictable risks and/or possible complications that may result in a catastrophic outcome. The patient indicated having understood very clearly. We have given the patient no guarantees and we have made no promises. Enough time was given to the patient to ask questions, all of which were answered to the patient's satisfaction. Mr. Rizzo has indicated that he wanted to continue with the procedure. Attestation: I, the ordering provider, attest that I have discussed with the patient the benefits, risks, side-effects, alternatives, likelihood of achieving goals, and potential problems during recovery for the procedure that I have provided informed consent. Date  Time: 02/17/2022  8:55 AM  Pre-Procedure Preparation:  Monitoring: As per clinic protocol. Respiration, ETCO2, SpO2, BP, heart rate and rhythm monitor placed and checked for adequate function Safety Precautions: Patient was assessed for positional comfort and pressure points before  starting the procedure. Time-out: I initiated and conducted the "Time-out" before starting the procedure, as per protocol. The patient was asked to participate by confirming the accuracy of the "Time Out" information. Verification of the correct person, site, and procedure were performed and confirmed by me,  the nursing staff, and the patient. "Time-out" conducted as per Joint Commission's Universal Protocol (UP.01.01.01). Time: 0941  Description of Procedure:          Procedural Technique Safety Precautions: Aspiration looking for blood return was conducted prior to all injections. At no point did we inject any substances, as a needle was being advanced. No attempts were made at seeking any paresthesias. Safe injection practices and needle disposal techniques used. Medications properly checked for expiration dates. SDV (single dose vial) medications used. Description of the Procedure: Protocol guidelines were followed. The patient was placed in position over the procedure table. The target area was identified and the area prepped in the usual manner. Skin & deeper tissues infiltrated with local anesthetic. Appropriate amount of time allowed to pass for local anesthetics to take effect. The procedure needles were then advanced to the target area. Proper needle placement secured. Negative aspiration confirmed. Solution injected in intermittent fashion, asking for systemic symptoms every 0.5cc of injectate. The needles were then removed and the area cleansed, making sure to leave some of the prepping solution back to take advantage of its long term bactericidal properties.  Vitals:   02/17/22 0858 02/17/22 0933 02/17/22 0940 02/17/22 0946  BP: (!) 147/68 (!) 151/88 134/86 (!) 142/74  Pulse: 80     Resp: 17 18 16 18   Temp: (!) 96.3 F (35.7 C)     TempSrc: Temporal     SpO2: 100% 95% 95% 95%  Weight: (!) 355 lb (161 kg)     Height: 6' 9"  (2.057 m)        5 cc solution made of 4 cc of 0.2%  ropivacaine, 1 cc of Decadron 10 mg/cc. Injected along left  SSN after contrast confirmation   Start Time: 0941 hrs. End Time: 0945 hrs.  Imaging Guidance (Non-Spinal):          Type of Imaging Technique: Fluoroscopy Guidance (Non-Spinal) Indication(s): Assistance in needle guidance and placement for procedures requiring needle placement in or near specific anatomical locations not easily accessible without such assistance. Exposure Time: Please see nurses notes. Contrast: Before injecting any contrast, we confirmed that the patient did not have an allergy to iodine, shellfish, or radiological contrast. Once satisfactory needle placement was completed at the desired level, radiological contrast was injected. Contrast injected under live fluoroscopy. No contrast complications. See chart for type and volume of contrast used. Fluoroscopic Guidance: I was personally present during the use of fluoroscopy. "Tunnel Vision Technique" used to obtain the best possible view of the target area. Parallax error corrected before commencing the procedure. "Direction-depth-direction" technique used to introduce the needle under continuous pulsed fluoroscopy. Once target was reached, antero-posterior, oblique, and lateral fluoroscopic projection used confirm needle placement in all planes. Images permanently stored in EMR. Interpretation: I personally interpreted the imaging intraoperatively. Adequate needle placement confirmed in multiple planes. Appropriate spread of contrast into desired area was observed. No evidence of afferent or efferent intravascular uptake. Permanent images saved into the patient's record.  Post-operative Assessment:  Post-procedure Vital Signs:  Pulse/HCG Rate: 8083 Temp: (!) 96.3 F (35.7 C) Resp: 18 BP: (!) 142/74 SpO2: 95 %  EBL: None  Complications: No immediate post-treatment complications observed by team, or reported by patient.  Note: The patient tolerated the entire  procedure well. A repeat set of vitals were taken after the procedure and the patient was kept under observation following institutional policy, for this type of procedure. Post-procedural neurological assessment was performed, showing return to baseline,  prior to discharge. The patient was provided with post-procedure discharge instructions, including a section on how to identify potential problems. Should any problems arise concerning this procedure, the patient was given instructions to immediately contact us, at any time, without hesitation. In any case, we plan to contact the patient by telephone for a follow-up status report regarding this interventional procedure.  Comments:  No additional relevant information.  Plan of Care  Patient is endorsing right elbow pain.  I did a physical exam on him and it seems more soft tissue related superior to his olecranon.  It is tender to palpation.  Recommend CT of right elbow without contrast as below.  If good relief with left suprascapular nerve block can consider left suprascapular nerve pulsed radiofrequency ablation which she had done for the right suprascapular nerve and is getting good pain relief from.   Orders:  Orders Placed This Encounter  Procedures   DG [facility] C-ARM 1-60 MIN NO REPORT    Intraoperative interpretation by procedural physician at [facility].    Standing Status:   Standing    Number of Occurrences:   1    Order Specific Question:   Reason for exam:    Answer:   Assistance in needle guidance and placement for procedures requiring needle placement in or near specific anatomical locations not easily accessible without such assistance.   CT ELBOW RIGHT WO CONTRAST    Standing Status:   Future    Standing Expiration Date:   02/18/2023    Order Specific Question:   Preferred imaging location?    Answer:   [facility]    Medications ordered for procedure: Meds ordered this encounter  Medications    iohexol (OMNIPAQUE) 180 MG/ML injection 10 mL    Must be Myelogram-compatible. If not available, you may substitute with a water-soluble, non-ionic, hypoallergenic, myelogram-compatible radiological contrast medium.   lidocaine (XYLOCAINE) 2 % (with pres) injection 400 mg   ropivacaine (PF) 2 mg/mL (0.2%) (NAROPIN) injection 4 mL   dexamethasone (DECADRON) injection 10 mg   Medications administered: We administered iohexol, lidocaine, ropivacaine (PF) 2 mg/mL (0.2%), and dexamethasone.  See the medical record for exact dosing, route, and time of administration.  Follow-up plan:   Return in about 4 weeks (around 03/17/2022), or PPE IN PERSON.       Right SSNB #1 08/10/21, pRFA 12/07/21, Left SSNB #1 02/17/22   Recent Visits Date Type Provider Dept  02/02/22 Office Visit Gillis Santa, MD [facility]  01/20/22 Office Visit Gillis Santa, MD [facility]  12/07/21 Procedure visit Gillis Santa, MD [facility]  Showing recent visits within past 90 days and meeting all other requirements Today's Visits Date Type Provider Dept  02/17/22 Procedure visit Gillis Santa, MD [facility]  Showing today's visits and meeting all other requirements Future Appointments Date Type Provider Dept  03/17/22 Appointment Gillis Santa, MD [facility]  Showing future appointments within next 90 days and meeting all other requirements  Disposition: Discharge home  Discharge (Date  Time): 02/17/2022; 1000 hrs.   Primary Care Physician: Scot Dock, [place] Primary Care Location: [facility] Note by: Gillis Santa, MD Date: 02/17/2022; Time: 10:10 AM  Disclaimer:  Medicine is not an exact science. The only guarantee in medicine is that nothing is guaranteed. It is important to note that the decision to proceed with this intervention was based on the information collected from the patient. The Data and conclusions were drawn  from the  patient's questionnaire, the interview, and the physical examination. Because the information was provided in large part by the patient, it cannot be guaranteed that it has not been purposely or unconsciously manipulated. Every effort has been made to obtain as much relevant data as possible for this evaluation. It is important to note that the conclusions that lead to this procedure are derived in large part from the available data. Always take into account that the treatment will also be dependent on availability of resources and existing treatment guidelines, considered by other Pain Management Practitioners as being common knowledge and practice, at the time of the intervention. For Medico-Legal purposes, it is also important to point out that variation in procedural techniques and pharmacological choices are the acceptable norm. The indications, contraindications, technique, and results of the above procedure should only be interpreted and judged by a Board-Certified Interventional Pain Specialist with extensive familiarity and expertise in the same exact procedure and technique.

## 2022-02-17 NOTE — Progress Notes (Signed)
Safety precautions to be maintained throughout the outpatient stay will include: orient to surroundings, keep bed in low position, maintain call bell within reach at all times, provide assistance with transfer out of bed and ambulation.  

## 2022-02-18 ENCOUNTER — Telehealth: Payer: Self-pay

## 2022-02-18 NOTE — Telephone Encounter (Signed)
Post procedure phone call.  LM 

## 2022-02-19 ENCOUNTER — Ambulatory Visit: Payer: Medicare Other | Admitting: Physical Therapy

## 2022-02-26 ENCOUNTER — Ambulatory Visit: Payer: Medicare Other | Admitting: Physical Therapy

## 2022-03-05 ENCOUNTER — Ambulatory Visit: Payer: Medicare Other | Admitting: Physical Therapy

## 2022-03-12 ENCOUNTER — Ambulatory Visit: Payer: Medicare Other | Admitting: Physical Therapy

## 2022-03-17 ENCOUNTER — Ambulatory Visit: Payer: Medicare Other | Admitting: Student in an Organized Health Care Education/Training Program

## 2022-03-19 ENCOUNTER — Ambulatory Visit: Payer: Medicare Other | Admitting: Physical Therapy

## 2022-03-31 DIAGNOSIS — C4441 Basal cell carcinoma of skin of scalp and neck: Secondary | ICD-10-CM | POA: Insufficient documentation

## 2022-03-31 DIAGNOSIS — C434 Malignant melanoma of scalp and neck: Secondary | ICD-10-CM

## 2022-03-31 HISTORY — DX: Malignant melanoma of scalp and neck: C43.4

## 2022-03-31 HISTORY — DX: Basal cell carcinoma of skin of scalp and neck: C44.41

## 2022-04-12 ENCOUNTER — Encounter: Payer: Self-pay | Admitting: Student in an Organized Health Care Education/Training Program

## 2022-04-12 ENCOUNTER — Ambulatory Visit
Payer: Medicare Other | Attending: Student in an Organized Health Care Education/Training Program | Admitting: Student in an Organized Health Care Education/Training Program

## 2022-04-12 VITALS — BP 124/75 | HR 93 | Temp 97.4°F | Resp 16 | Ht >= 80 in | Wt 355.0 lb

## 2022-04-12 DIAGNOSIS — G5682 Other specified mononeuropathies of left upper limb: Secondary | ICD-10-CM | POA: Insufficient documentation

## 2022-04-12 DIAGNOSIS — M25512 Pain in left shoulder: Secondary | ICD-10-CM | POA: Diagnosis present

## 2022-04-12 DIAGNOSIS — G894 Chronic pain syndrome: Secondary | ICD-10-CM | POA: Insufficient documentation

## 2022-04-12 MED ORDER — KETOROLAC TROMETHAMINE 30 MG/ML IJ SOLN
30.0000 mg | Freq: Once | INTRAMUSCULAR | Status: AC
  Administered 2022-04-12: 30 mg via INTRAMUSCULAR

## 2022-04-12 MED ORDER — KETOROLAC TROMETHAMINE 30 MG/ML IJ SOLN
INTRAMUSCULAR | Status: AC
  Filled 2022-04-12: qty 1

## 2022-04-12 NOTE — Patient Instructions (Signed)
______________________________________________________________________  Preparing for your procedure  During your procedure appointment there will be: No Prescription Refills. No disability issues to discussed. No medication changes or discussions.  Instructions: Food intake: Avoid eating anything solid for at least 8 hours prior to your procedure. Clear liquid intake: You may take clear liquids such as water up to 2 hours prior to your procedure. (No carbonated drinks. No soda.) Transportation: Unless otherwise stated by your physician, bring a driver. Morning Medicines: Except for blood thinners, take all of your other morning medications with a sip of water. Make sure to take your heart and blood pressure medicines. If your blood pressure's lower number is above 100, the case will be rescheduled. Blood thinners: If you take a blood thinner, but were not instructed to stop it, call our office [phone] and ask to talk to a nurse. Not stopping a blood thinner prior to certain procedures could lead to serious complications. Diabetics on insulin: Notify the staff so that you can be scheduled 1st case in the morning. If your diabetes requires high dose insulin, take only  of your normal insulin dose the morning of the procedure and notify the staff that you have done so. Preventing infections: Shower with an antibacterial soap the morning of your procedure.  Build-up your immune system: Take 1000 mg of Vitamin C with every meal (3 times a day) the day prior to your procedure. Antibiotics: Inform the nursing staff if you are taking any antibiotics or if you have any conditions that may require antibiotics prior to procedures. (Example: recent joint implants)   Pregnancy: If you are pregnant make sure to notify the nursing staff. Not doing so may result in injury to the fetus, including death.  Sickness: If you have a cold, fever, or any active infections, call and cancel or reschedule your  procedure. Receiving steroids while having an infection may result in complications. Arrival: You must be in the facility at least 30 minutes prior to your scheduled procedure. Tardiness: Your scheduled time is also the cutoff time. If you do not arrive at least 15 minutes prior to your procedure, you will be rescheduled.  Children: Do not bring any children with you. Make arrangements to keep them home. Dress appropriately: There is always a possibility that your clothing may get soiled. Avoid long dresses. Valuables: Do not bring any jewelry or valuables.  Reasons to call and reschedule or cancel your procedure: (Following these recommendations will minimize the risk of a serious complication.) Surgeries: Avoid having procedures within 2 weeks of any surgery. (Avoid for 2 weeks before or after any surgery). Flu Shots: Avoid having procedures within 2 weeks of a flu shots or . (Avoid for 2 weeks before or after immunizations). Barium: Avoid having a procedure within 7-10 days after having had a radiological study involving the use of radiological contrast. (Myelograms, Barium swallow or enema study). Heart attacks: Avoid any elective procedures or surgeries for the initial 6 months after a "Myocardial Infarction" (Heart Attack). Blood thinners: It is imperative that you stop these medications before procedures. Let us know if you if you take any blood thinner.  Infection: Avoid procedures during or within two weeks of an infection (including chest colds or gastrointestinal problems). Symptoms associated with infections include: Localized redness, fever, chills, night sweats or profuse sweating, burning sensation when voiding, cough, congestion, stuffiness, runny nose, sore throat, diarrhea, nausea, vomiting, cold or Flu symptoms, recent or current infections. It is specially important if the infection is   over the area that we intend to treat. Heart and lung problems: Symptoms that may suggest an  active cardiopulmonary problem include: cough, chest pain, breathing difficulties or shortness of breath, dizziness, ankle swelling, uncontrolled high or unusually low blood pressure, and/or palpitations. If you are experiencing any of these symptoms, cancel your procedure and contact your primary care physician for an evaluation.  Remember:  Regular Business hours are:  Monday to Thursday 8:00 AM to 4:00 PM  Provider's Schedule: Delano Metz, MD:  Procedure days: Tuesday and Thursday 7:30 AM to 4:00 PM  Edward Jolly, MD:  Procedure days: Monday and Wednesday 7:30 AM to 4:00 PM  ______________________________________________________________________  Radiofrequency Ablation Radiofrequency ablation is a procedure that is performed to relieve pain. The procedure is often used for back, neck, or arm pain. Radiofrequency ablation involves the use of a machine that creates radio waves to make heat. During the procedure, the heat is applied to the nerve that carries the pain signal. The heat damages the nerve and interferes with the pain signal. Pain relief usually starts about 2 weeks after the procedure and lasts for 6 months to 1 year. Tell a health care provider about: Any allergies you have. All medicines you are taking, including vitamins, herbs, eye drops, creams, and over-the-counter medicines. Any problems you or family members have had with anesthetic medicines. Any bleeding problems you have. Any surgeries you have had. Any medical conditions you have. Whether you are pregnant or may be pregnant. What are the risks? Generally, this is a safe procedure. However, problems may occur, including: Pain or soreness at the injection site. Allergic reaction to medicines given during the procedure. Bleeding. Infection at the injection site. Damage to nerves or blood vessels. What happens before the procedure? When to stop eating and drinking Follow instructions from your health care  provider about what you may eat and drink before your procedure. These may include: 8 hours before the procedure Stop eating most foods. Do not eat meat, fried foods, or fatty foods. Eat only light foods, such as toast or crackers. All liquids are okay except energy drinks and alcohol. 6 hours before the procedure Stop eating. Drink only clear liquids, such as water, clear fruit juice, black coffee, plain tea, and sports drinks. Do not drink energy drinks or alcohol. 2 hours before the procedure Stop drinking all liquids. You may be allowed to take medicine with small sips of water. If you do not follow your health care provider's instructions, your procedure may be delayed or canceled. Medicines Ask your health care provider about: Changing or stopping your regular medicines. This is especially important if you are taking diabetes medicines or blood thinners. Taking medicines such as aspirin and ibuprofen. These medicines can thin your blood. Do not take these medicines unless your health care provider tells you to take them. Taking over-the-counter medicines, vitamins, herbs, and supplements. General instructions Ask your health care provider what steps will be taken to help prevent infection. These steps may include: Removing hair at the procedure site. Washing skin with a germ-killing soap. Taking antibiotic medicine. If you will be going home right after the procedure, plan to have a responsible adult: Take you home from the hospital or clinic. You will not be allowed to drive. Care for you for the time you are told. What happens during the procedure?  You will be awake during the procedure. You will need to be able to talk with the health care provider during the procedure. An  IV will be inserted into one of your veins. You will be given one or more of the following: A medicine to help you relax (sedative). A medicine to numb the area (local anesthetic). Your health care  provider will insert a radiofrequency needle into the area to be treated. This is done with the help of fluoroscopy. A wire that carries the radio waves (electrode) will be put through the radiofrequency needle. An electrical pulse will be sent through the electrode to verify the correct nerve that is causing your pain. You will feel a tingling sensation, and you may have muscle twitching. The tissue around the needle tip will be heated by an electric current that comes from the radiofrequency machine. This will numb the nerves. The needle will be removed. A bandage (dressing) will be put on the insertion area. The procedure may vary among health care providers and hospitals. What happens after the procedure? Your blood pressure, heart rate, breathing rate, and blood oxygen level will be monitored until you leave the hospital or clinic. Return to your normal activities as told by your health care provider. Ask your health care provider what activities are safe for you. If you were given a sedative during the procedure, it can affect you for several hours. Do not drive or operate machinery until your health care provider says that it is safe. Summary Radiofrequency ablation is a procedure that is performed to relieve pain. The procedure is often used for back, neck, or arm pain. Radiofrequency ablation involves the use of a machine that creates radio waves to make heat. Plan to have a responsible adult take you home from the hospital or clinic. Do not drive or operate machinery until your health care provider says that it is safe. Return to your normal activities as told by your health care provider. Ask your health care provider what activities are safe for you. This information is not intended to replace advice given to you by your health care provider. Make sure you discuss any questions you have with your health care provider. Document Revised: 10/21/2020 Document Reviewed: 10/21/2020 Elsevier  Patient Education  2023 [employer].

## 2022-04-12 NOTE — Progress Notes (Signed)
Safety precautions to be maintained throughout the outpatient stay will include: orient to surroundings, keep bed in low position, maintain call bell within reach at all times, provide assistance with transfer out of bed and ambulation.  

## 2022-04-12 NOTE — Progress Notes (Signed)
PROVIDER NOTE: Information contained herein reflects review and annotations entered in association with encounter. Interpretation of such information and data should be left to medically-trained personnel. Information provided to patient can be located elsewhere in the medical record under "Patient Instructions". Document created using STT-dictation technology, any transcriptional errors that may result from process are unintentional.    Patient: Nathan Dean  Service Category: E/M  Provider: Gillis Santa, MD  DOB: 02-16-1956  DOS: 04/12/2022  Referring Provider: Emily Filbert*  MRN: [id number]  Specialty: Interventional Pain Management  PCP: [facility]  Type: Established Patient  Setting: Ambulatory outpatient    Location: Office  Delivery: Face-to-face     HPI  Nathan Dean, a 66 y.o. 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 Nathan Dean primary complain today is Shoulder Pain Last encounter: My last encounter with him was on 03/17/2022. Pertinent problems: Nathan Dean has Chronic pain syndrome; History of bilateral knee replacement; Bilateral chronic knee pain; Post laminectomy syndrome; Localized pain of left shoulder joint; and Disorder of left suprascapular nerve on their pertinent problem list. Pain Assessment: Severity of Chronic pain is reported as a 7 /10. Location: Shoulder Left/into left bicep to elbow, Patient is having pain in right. Onset: More than a month ago. Quality: Aching, Stabbing, Sharp. Timing: Constant. Modifying factor(s): "Nothing helps me". Vitals:  height is _0  (2.057 m) and weight is 355 lb (161 kg) (abnormal). His temperature is 97.4 F (36.3 C) (abnormal). His blood pressure is 124/75 and his pulse is 93. His respiration is 16 and oxygen saturation is 98%.   Reason for encounter: post-procedure evaluation and assessment.    Post-procedure evaluation   Type: Suprascapular  nerve block #1  Laterality:  Left Level: Superior to scapular spine, lateral to supraspinatus fossa (Suprascapular notch).  Imaging: Fluoroscopic guidance Anesthesia: Local anesthesia (1-2% Lidocaine) Anxiolysis: None                 Sedation: None. DOS: 02/17/2022  Performed by: Gillis Santa, MD  Purpose: Diagnostic/Therapeutic Indications: Shoulder pain, severe enough to impact quality of life and/or function. 1. Localized pain of left shoulder joint   2. Disorder of left suprascapular nerve   3. Chronic pain syndrome   4. Chronic elbow pain, right    NAS-11 score:   Pre-procedure: 8 /10   Post-procedure: 0-No pain/10      Effectiveness:  Initial hour after procedure: 50 %  Subsequent 4-6 hours post-procedure: 50 % (Few days)  Analgesia past initial 6 hours: 50% for first week Ongoing improvement:  Analgesic:  <20% Function: Back to baseline ROM: Back to baseline    ROS  Constitutional: Denies any fever or chills Gastrointestinal: No reported hemesis, hematochezia, vomiting, or acute GI distress Musculoskeletal:  Severe left shoulder pain and limited range of motion Neurological:  Paresthesias of left shoulder  Medication Review  DULoxetine, Insulin Pen Needle, Melatonin, ONE TOUCH LANCETS, ONE TOUCH ULTRA SYSTEM KIT, buPROPion, carvedilol, doxycycline, gabapentin, glucose blood, insulin aspart, loratadine, losartan, metFORMIN, methocarbamol, omeprazole, rosuvastatin, and senna-docusate  History Review  Allergy: Nathan Dean is allergic to penicillins, succinylcholine chloride, keflex [cephalexin], and sulfa antibiotics. Drug: Nathan Dean  reports no history of drug use. Alcohol:  reports current alcohol use. Tobacco:  reports that he quit smoking about 25 years ago. His smoking use included cigarettes. He has a 40.00 pack-year smoking history. He has never used smokeless tobacco. Social: Mr. Dean  reports that he quit  smoking about 25 years ago. His smoking use  included cigarettes. He has a 40.00 pack-year smoking history. He has never used smokeless tobacco. He reports current alcohol use. He reports that he does not use drugs. Medical:  has a past medical history of Complication of anesthesia, Diabetes ([place]), GERD (gastroesophageal reflux disease), Hyperlipidemia, Hypertension, Obesity, and Sleep apnea. Surgical: Nathan Dean  has a past surgical history that includes Replacement total knee bilateral (2014); Carpal tunnel release (Right, 12/28/2016); Carpal tunnel release (Left); Tonsillectomy; Cataract extraction w/ intraocular lens implant (Bilateral); Back surgery; Back surgery; and Shoulder open rotator cuff repair (Right, 08/07/2019). Family: family history includes Cancer in his brother; Kidney failure in his father; Ovarian cancer in his mother.  Laboratory Chemistry Profile   Renal Lab Results  Component Value Date   BUN 26 (H) 01/18/2020   CREATININE 0.82 01/18/2020   BCR 28 (H) 07/20/2017   GFRAA >60 01/18/2020   GFRNONAA >60 01/18/2020    Hepatic Lab Results  Component Value Date   AST 22 08/11/2019   ALT 18 08/11/2019   ALBUMIN 3.5 08/11/2019   ALKPHOS 68 08/11/2019    Electrolytes Lab Results  Component Value Date   NA 137 01/18/2020   K 4.0 01/18/2020   CL 101 01/18/2020   CALCIUM 9.3 01/18/2020   MG 2.3 08/17/2019    Bone No results found for: "VD25OH", "VD125OH2TOT", "BT5176HY0", "VP7106YI9", "25OHVITD1", "25OHVITD2", "25OHVITD3", "TESTOFREE", "TESTOSTERONE"  Inflammation (CRP: Acute Phase) (ESR: Chronic Phase) Lab Results  Component Value Date   ESRSEDRATE 77 (H) 08/16/2019   LATICACIDVEN 1.6 08/11/2019         Note: Above Lab results reviewed.   Physical Exam  General appearance: Well nourished, well developed, and well hydrated. In no apparent acute distress Mental status: Alert, oriented x 3 (person, place, & time)       Respiratory: No evidence of acute respiratory distress Eyes: PERLA Vitals: BP 124/75    Pulse 93   Temp (!) 97.4 F (36.3 C)   Resp 16   Ht _0  (2.057 m)   Wt (!) 355 lb (161 kg)   SpO2 98%   BMI 38.04 kg/m  BMI: Estimated body mass index is 38.04 kg/m as calculated from the following:   Height as of this encounter: _1  (2.057 m).   Weight as of this encounter: 355 lb (161 kg). Ideal: Ideal body weight: 98.3 kg (216 lb 11.4 oz) Adjusted ideal body weight: 123.4 kg (272 lb 0.4 oz)  Upper Extremity (UE) Exam    Side: Right upper extremity  Side: Left upper extremity  Skin & Extremity Inspection: Skin color, temperature, and hair growth are WNL. No peripheral edema or cyanosis. No masses, redness, swelling, asymmetry, or associated skin lesions. No contractures.  Skin & Extremity Inspection: Skin color, temperature, and hair growth are WNL. No peripheral edema or cyanosis. No masses, redness, swelling, asymmetry, or associated skin lesions. No contractures.  Functional ROM: Unrestricted ROM          Functional ROM: Pain restricted ROM for shoulder and elbow  Muscle Tone/Strength: Functionally intact. No obvious neuro-muscular anomalies detected.  Muscle Tone/Strength: Functionally intact. No obvious neuro-muscular anomalies detected.  Sensory (Neurological): Unimpaired          Sensory (Neurological): Neurogenic pain pattern and musculoskeletal          Palpation: No palpable anomalies              Palpation: No palpable anomalies  Provocative Test(s):  Phalen's test: deferred Tinel's test: deferred Apley's scratch test (touch opposite shoulder):  Action 1 (Across chest): deferred Action 2 (Overhead): deferred Action 3 (LB reach): deferred   Provocative Test(s):  Phalen's test: deferred Tinel's test: deferred Apley's scratch test (touch opposite shoulder):  Action 1 (Across chest): Decreased ROM Action 2 (Overhead): Decreased ROM Action 3 (LB reach): Decreased ROM     Assessment   Diagnosis Status  1. Disorder of left suprascapular nerve   2.  Localized pain of left shoulder joint   3. Chronic pain syndrome    Persistent Persistent Persistent     Plan of Care   Nathan Dean has a current medication list which includes the following long-term medication(s): carvedilol, duloxetine, gabapentin, loratadine, losartan, and rosuvastatin.  Pharmacotherapy (Medications Ordered): Toradol injection below.  Patient instructed to avoid NSAIDs for the next 7 days. Meds ordered this encounter  Medications   ketorolac (TORADOL) 30 MG/ML injection 30 mg    Status post positive left suprascapular nerve block.  We discussed left suprascapular nerve RFA for the purpose of obtaining longer-term pain relief.  Risk and benefits reviewed and patient would like to proceed.  Orders:  Orders Placed This Encounter  Procedures   Radiofrequency Shoulder Joint    For shoulder pain.    Standing Status:   Future    Standing Expiration Date:   07/13/2022    Scheduling Instructions:     Procedure: Suprascapular Nerve Radiofrequency Ablation     Laterality: LEFT     Level(s): Suprascapular nerve     Sedation: local     Scheduling Timeframe: As soon as pre-approved    Order Specific Question:   Where will this procedure be performed?    Answer:   [facility]   Follow-up plan:   Return in about 1 month (around 05/12/2022) for left SSN pulsed RFA, in clinic [facility].     Right SSNB #1 08/10/21, pRFA 12/07/21, Left SSNB #1 02/17/22    Recent Visits Date Type Provider Dept  02/17/22 Procedure visit Gillis Santa, MD [facility]  02/02/22 Office Visit Gillis Santa, MD [facility]  01/20/22 Office Visit Gillis Santa, MD [facility]  Showing recent visits within past 90 days and meeting all other requirements Today's Visits Date Type Provider Dept  04/12/22 Office Visit Gillis Santa, MD [facility]  Showing today's visits and meeting all other requirements Future Appointments Date Type Provider  Dept  05/12/22 Appointment Gillis Santa, MD [facility]  Showing future appointments within next 90 days and meeting all other requirements  I discussed the assessment and treatment plan with the patient. The patient was provided an opportunity to ask questions and all were answered. The patient agreed with the plan and demonstrated an understanding of the instructions.  Patient advised to call back or seek an in-person evaluation if the symptoms or condition worsens.  Duration of encounter: 30 minutes.  Total time on encounter, as per AMA guidelines included both the face-to-face and non-face-to-face time personally spent by the physician and/or other qualified health care professional(s) on the day of the encounter (includes time in activities that require the physician or other qualified health care professional and does not include time in activities normally performed by clinical staff). Physician's time may include the following activities when performed: preparing to see the patient (eg, review of tests, pre-charting review of records) obtaining and/or reviewing separately obtained history performing a medically appropriate examination and/or evaluation  counseling and educating the patient/family/caregiver ordering medications, tests, or procedures referring and communicating with other health care professionals (when not separately reported) documenting clinical information in the electronic or other health record independently interpreting results (not separately reported) and communicating results to the patient/ family/caregiver care coordination (not separately reported)  Note by: Gillis Santa, MD Date: 04/12/2022; Time: 3:01 PM

## 2022-04-15 ENCOUNTER — Telehealth: Payer: Self-pay | Admitting: Student in an Organized Health Care Education/Training Program

## 2022-04-15 NOTE — Telephone Encounter (Signed)
PT wants to know if he can take ibuprofen. Please give patient a call. Thanks

## 2022-04-17 ENCOUNTER — Other Ambulatory Visit: Payer: Self-pay | Admitting: Student in an Organized Health Care Education/Training Program

## 2022-04-27 IMAGING — MR MR SHOULDER*R* W/O CM
4 of 5 series · 19 of 40 positions shown · non-contrast
Comparison: None available

CLINICAL DATA: Right shoulder pain. History of rotator cuff surgery
Tuesday July, 2019

EXAM:
MRI OF THE RIGHT SHOULDER WITHOUT CONTRAST
TECHNIQUE: Multiplanar, multisequence MR imaging of the shoulder was performed.
No intravenous contrast was administered.

[Series 3: PD fat-sat · axial · 4.0mm · 0.31mm/px · z∈[-11,+76]mm · 8 of 20 slices shown (1 of 2)]
[im 1/20]
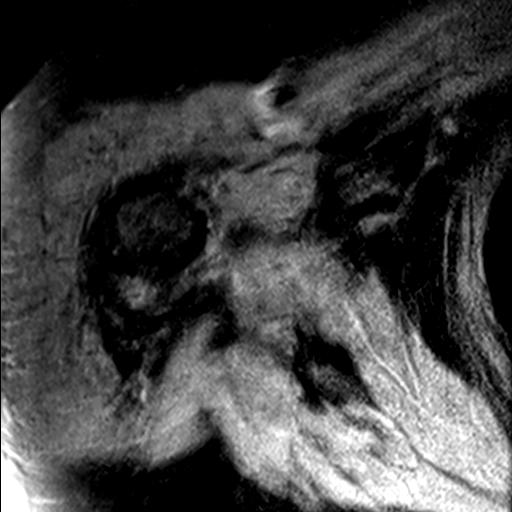
[im 3/20]
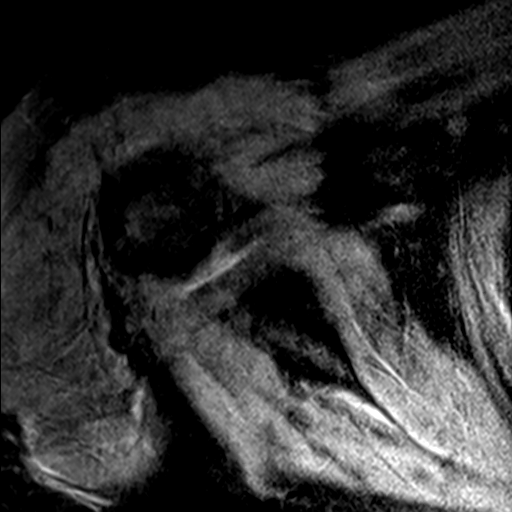
[im 6/20]
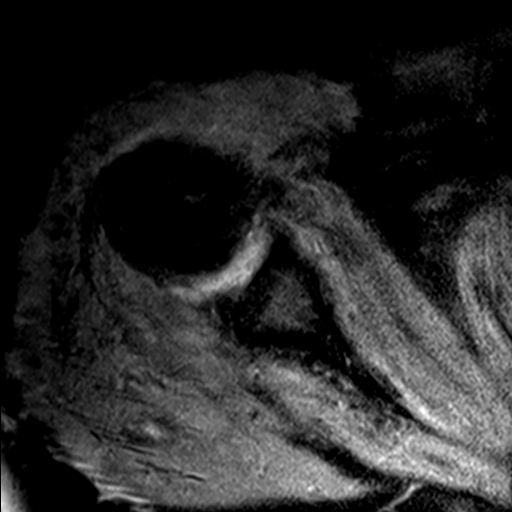
[im 9/20]
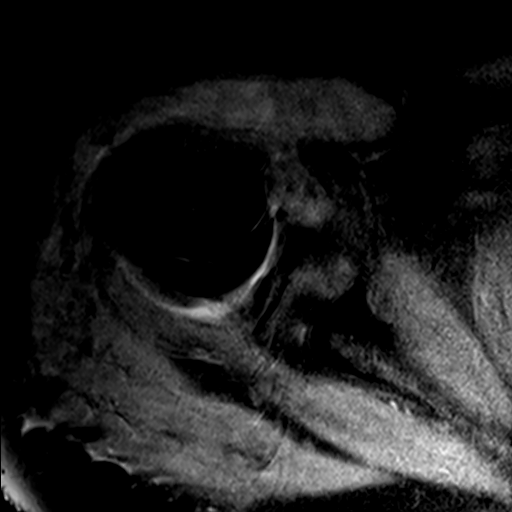
[im 11/20]
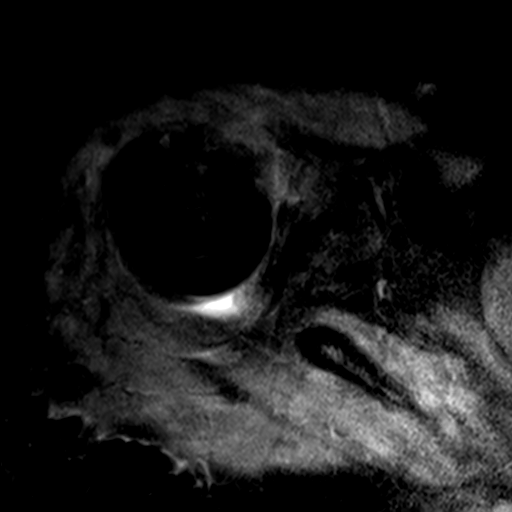
[im 14/20]
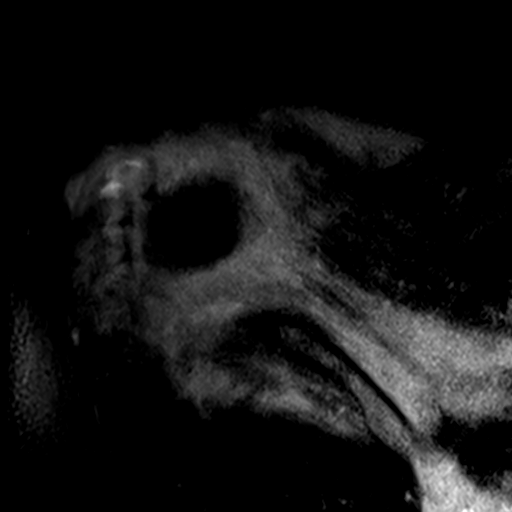
[im 17/20]
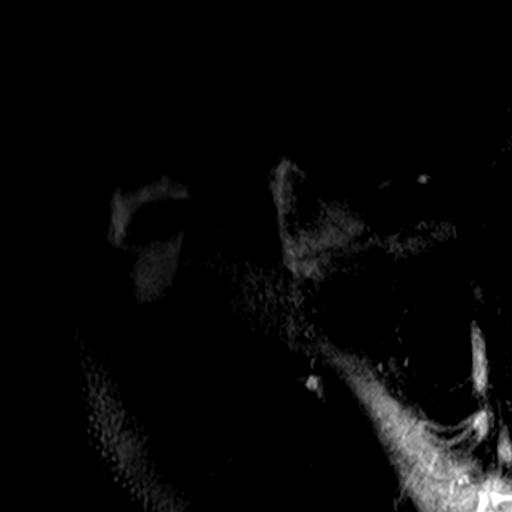
[im 20/20]
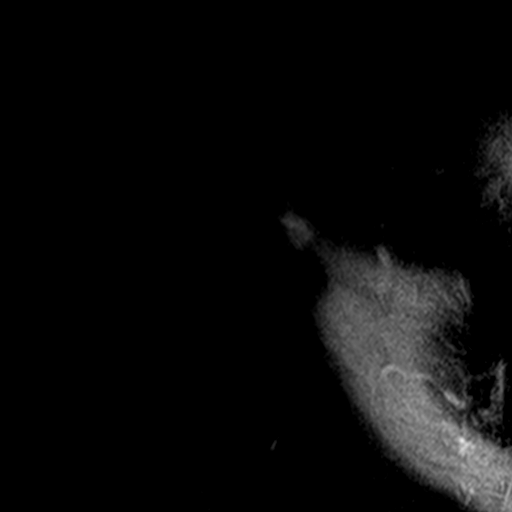

[Series 4: T2 fat-sat · oblique · 4.0mm · 0.31mm/px · 3 of 15 slices shown (1 of 2)]
[im 3/15]
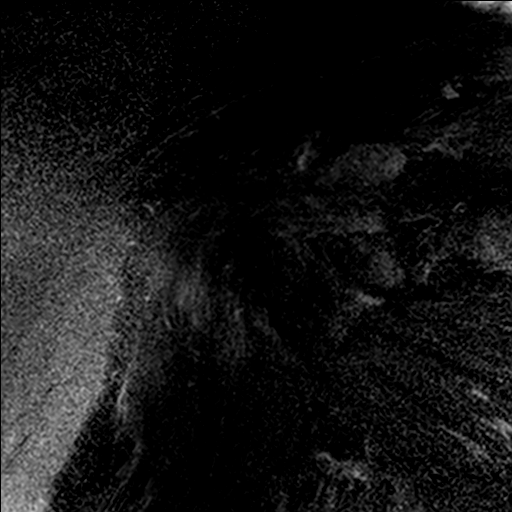
[im 8/15]
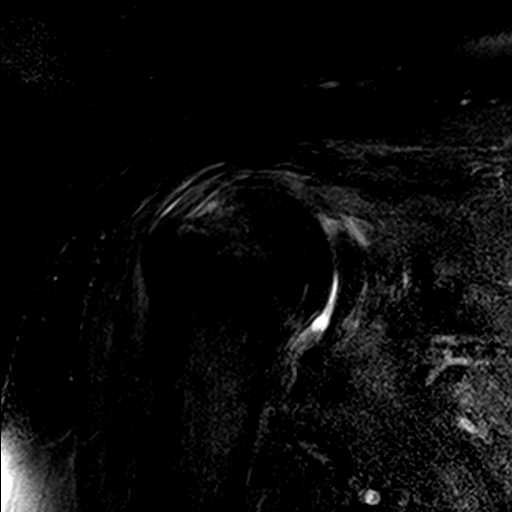
[im 12/15]
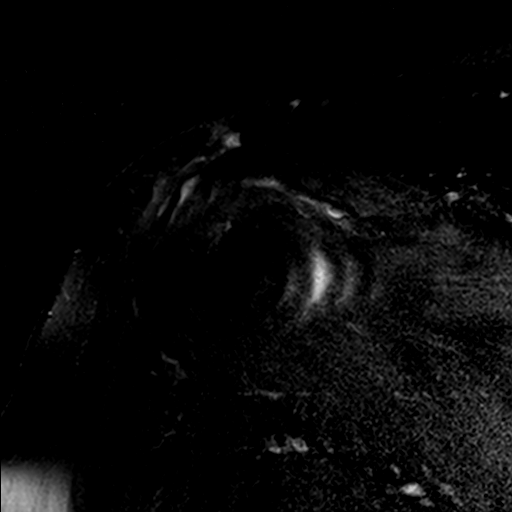

[Series 5: PD fat-sat · oblique · 4.0mm · 0.31mm/px · 5 of 15 slices shown (2 of 2)]
[im 1/15]
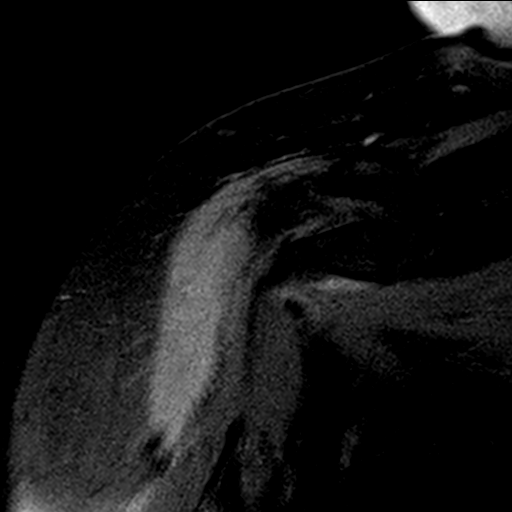
[im 3/15]
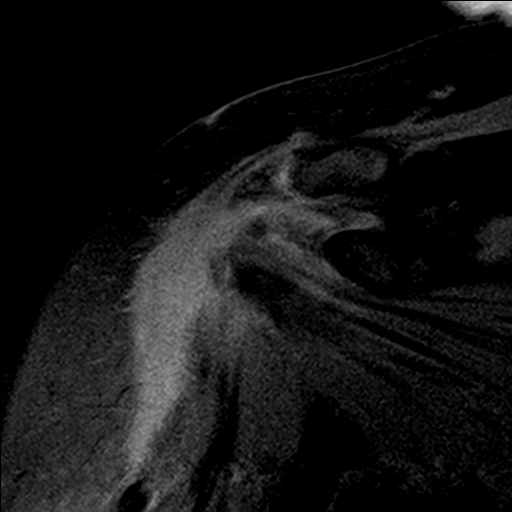
[im 5/15]
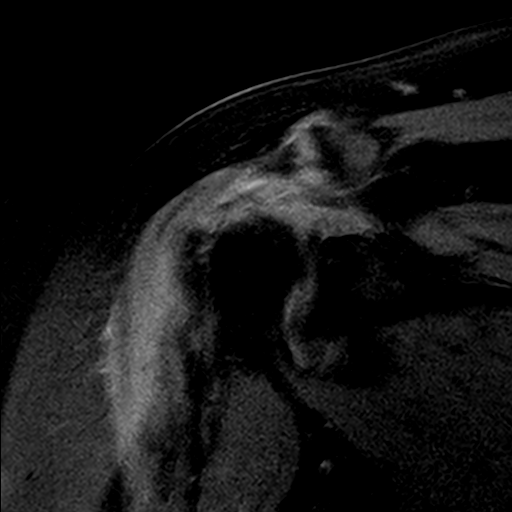
[im 8/15]
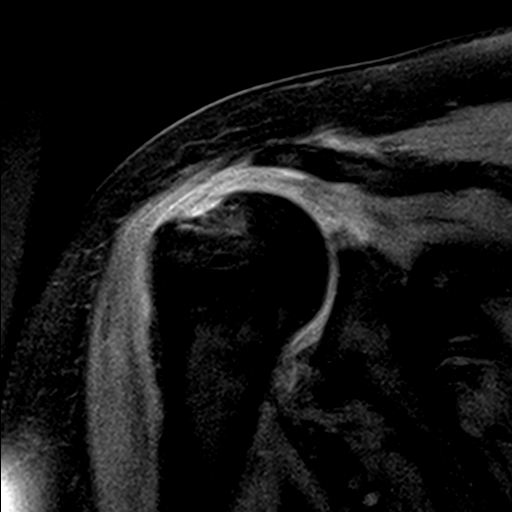
[im 12/15]
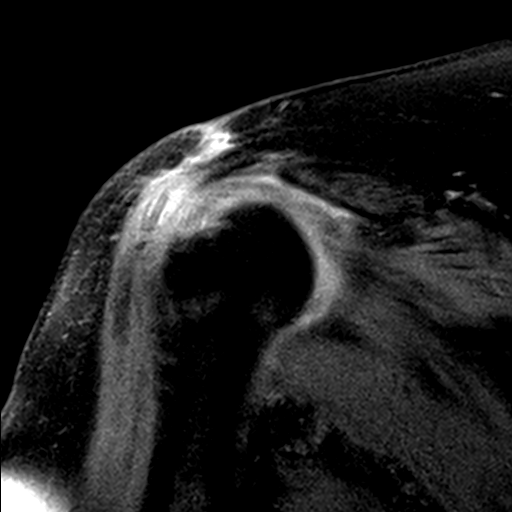

[Series 6: T2 fat-sat · oblique · 4.0mm · 0.29mm/px · 3 of 21 slices shown (2 of 2)]
[im 3/21]
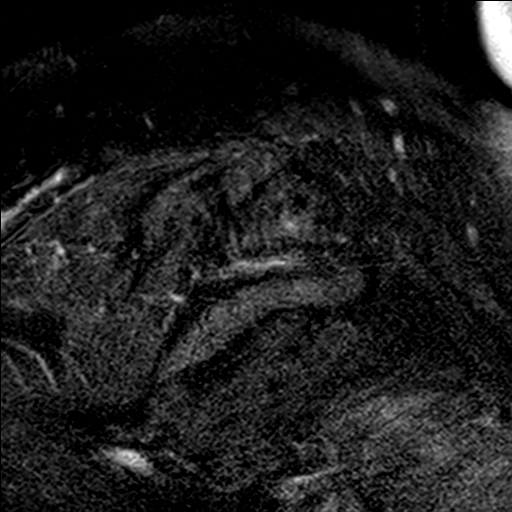
[im 11/21]
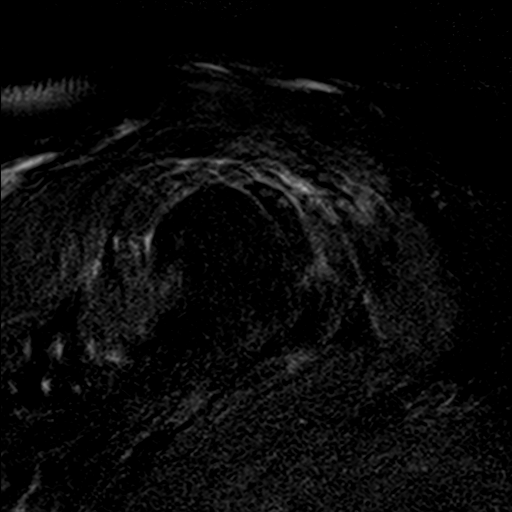
[im 18/21]
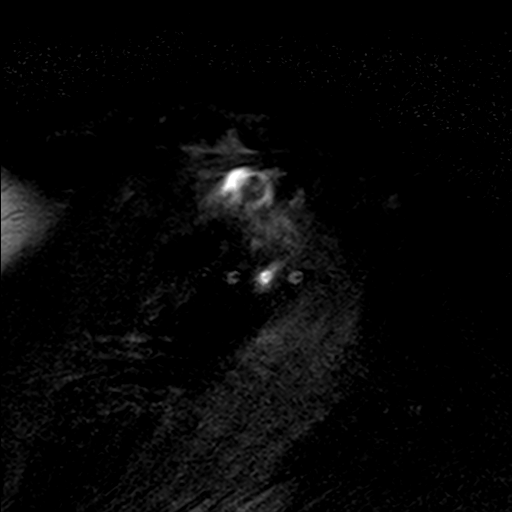

[19 of 40 positions shown; findings below may reference images not displayed]

FINDINGS: Technical note: Examination is significantly degraded by motion
artifact.

Rotator cuff: Although poorly evaluated. The rotator cuff tendons
appear to be grossly intact without obvious high-grade tear.
Heterogeneous intermediate to high T2 signal intensity within the
supraspinatus and infraspinatus tendons likely represent a
combination of tendinosis and postsurgical change.

Muscles:  Supraspinatus and infraspinatus muscle atrophy.

Biceps long head:  History of biceps tenotomy.

Acromioclavicular Joint: Moderate arthropathy of the AC joint. Small
amount of subacromial-subdeltoid bursal fluid.

Glenohumeral Joint: Small glenohumeral joint effusion. Limited
evaluation for focal chondral defect.

Labrum:  Poorly evaluated.

Bones: Anchor sites noted within the greater tuberosity. No evidence
of fracture or dislocation.

Other: No soft tissue fluid collection.
IMPRESSION: 1. Significantly motion degraded exam.
2. The rotator cuff tendons appear to be grossly intact without
obvious high-grade tear. Supraspinatus and infraspinatus muscle
atrophy.
3. Mild subacromial-subdeltoid bursitis.
4. Moderate acromioclavicular osteoarthritis.

## 2022-05-12 ENCOUNTER — Encounter: Payer: Self-pay | Admitting: Student in an Organized Health Care Education/Training Program

## 2022-05-12 ENCOUNTER — Ambulatory Visit
Admission: RE | Admit: 2022-05-12 | Discharge: 2022-05-12 | Disposition: A | Discharge status: Home / Self Care | Payer: Medicare Other | Source: Ambulatory Visit | Attending: Student in an Organized Health Care Education/Training Program | Admitting: Student in an Organized Health Care Education/Training Program

## 2022-05-12 ENCOUNTER — Ambulatory Visit
Payer: Medicare Other | Attending: Student in an Organized Health Care Education/Training Program | Admitting: Student in an Organized Health Care Education/Training Program

## 2022-05-12 VITALS — BP 166/94 | HR 85 | Temp 97.4°F | Resp 18 | Ht >= 80 in | Wt 355.0 lb

## 2022-05-12 DIAGNOSIS — G894 Chronic pain syndrome: Secondary | ICD-10-CM | POA: Diagnosis present

## 2022-05-12 DIAGNOSIS — G5682 Other specified mononeuropathies of left upper limb: Secondary | ICD-10-CM | POA: Insufficient documentation

## 2022-05-12 DIAGNOSIS — M25512 Pain in left shoulder: Secondary | ICD-10-CM | POA: Diagnosis present

## 2022-05-12 MED ORDER — DEXAMETHASONE SODIUM PHOSPHATE 10 MG/ML IJ SOLN
10.0000 mg | Freq: Once | INTRAMUSCULAR | Status: AC
  Administered 2022-05-12: 10 mg
  Filled 2022-05-12: qty 1

## 2022-05-12 MED ORDER — IOHEXOL 180 MG/ML  SOLN
10.0000 mL | Freq: Once | INTRAMUSCULAR | Status: AC
  Administered 2022-05-12: 10 mL via INTRA_ARTICULAR
  Filled 2022-05-12: qty 20

## 2022-05-12 MED ORDER — ROPIVACAINE HCL 2 MG/ML IJ SOLN
9.0000 mL | Freq: Once | INTRAMUSCULAR | Status: AC
  Administered 2022-05-12: 9 mL via PERINEURAL
  Filled 2022-05-12: qty 20

## 2022-05-12 NOTE — Progress Notes (Signed)
PROVIDER NOTE: Interpretation of information contained herein should be left to medically-trained personnel. Specific patient instructions are provided elsewhere under "Patient Instructions" section of medical record. This document was created in part using STT-dictation technology, any transcriptional errors that may result from this process are unintentional.  Patient: Nathan Dean Type: Established DOB: [birth year] MRN: [id number] PCP: [facility]  Service: Procedure DOS: 05/12/2022 Setting: Ambulatory Location: Ambulatory outpatient facility Delivery: Face-to-face Provider: Gillis Santa, MD Specialty: Interventional Pain Management Specialty designation: 09 Location: Outpatient facility Ref. Prov.: [place], [place]    Primary Reason for Visit: Interventional Pain Management Treatment. CC: Shoulder Pain (left)   Procedure:           Type: Suprascapular nerve Radiofrequency Ablation (RFA) #1  Laterality: Left  Level: Superior to the scapular spine, in the lateral aspect of the supraspinatus fossa (Suprescapular notch).  Imaging: Fluoroscopic guidance Anesthesia: Local anesthesia (1-2% Lidocaine) DOS: 05/12/2022  Performed by: Gillis Santa, MD  Purpose: Therapeutic Indications: Shoulder pain severe enough to impact quality of life or function. Indications: 1. Disorder of left suprascapular nerve   2. Localized pain of left shoulder joint   3. Chronic pain syndrome    Nathan Dean has been dealing with the above chronic pain for longer than three months and has either failed to respond, was unable to tolerate, or simply did not get enough benefit from other more conservative therapies including, but not limited to: 1. Over-the-counter medications 2. Anti-inflammatory medications 3. Muscle relaxants 4. Membrane stabilizers 5. Opioids 6. Physical therapy and/or chiropractic manipulation 7. Modalities (Heat, ice, etc.) 8. Invasive techniques such as  nerve blocks. Nathan Dean has attained more than 50% relief of the pain from a series of diagnostic injections conducted in separate occasions.  Pain Score: Pre-procedure: 8 /10 Post-procedure: 4 /10    Position / Prep / Materials:  Position: Prone  Prep solution: DuraPrep (Iodine Povacrylex [0.7% available iodine] and Isopropyl Alcohol, 74% w/w) Prep Area: Entire shoulder area.  This includes the lateral and posterior aspect of the neck, lateral and posterior aspect of the upper third of the upper arm, from the axilla to the spine in the upper back, down to the lower border of the scapula. Materials:  Tray:  RFA (Radiofrequency) tray Needle(s):  Type: RFA (Teflon-coated radiofrequency ablation needles)  Gauge (G): 22  Length: Regular (10cm) Qty: 1  Pre-op H&P Assessment:  Nathan Dean is a 66 y.o. (year old), male patient, seen today for interventional treatment. He  has a past surgical history that includes Replacement total knee bilateral (2014); Carpal tunnel release (Right, 12/28/2016); Carpal tunnel release (Left); Tonsillectomy; Cataract extraction w/ intraocular lens implant (Bilateral); Back surgery; Back surgery; and Shoulder open rotator cuff repair (Right, 08/07/2019). Nathan Dean has a current medication list which includes the following prescription(s): one touch ultra system kit, bupropion, carvedilol, doxycycline, duloxetine, gabapentin, glucose blood, insulin aspart, insulin pen needle, loratadine, losartan, melatonin, metformin, methocarbamol, omeprazole, one touch lancets, rosuvastatin, and senna-docusate. His primarily concern today is the Shoulder Pain (left)  Initial Vital Signs:  Pulse/HCG Rate: 85ECG Heart Rate: 78 Temp: (!) 97.4 F (36.3 C) Resp: 16 BP: 138/74 SpO2: 98 %  BMI: Estimated body mass index is 38.04 kg/m as calculated from the following:   Height as of this encounter: _0  (2.057 m).   Weight as of this encounter: 355 lb (161 kg).  Risk  Assessment: Allergies: Reviewed. He is allergic to penicillins, succinylcholine chloride, keflex [cephalexin], and sulfa antibiotics.  Allergy Precautions:  None required Coagulopathies: Reviewed. None identified.  Blood-thinner therapy: None at this time Active Infection(s): Reviewed. None identified. Nathan Dean is afebrile  Site Confirmation: Nathan Dean was asked to confirm the procedure and laterality before marking the site Procedure checklist: Completed Consent: Before the procedure and under the influence of no sedative(s), amnesic(s), or anxiolytics, the patient was informed of the treatment options, risks and possible complications. To fulfill our ethical and legal obligations, as recommended by the American Medical Association's Code of Ethics, I have informed the patient of my clinical impression; the nature and purpose of the treatment or procedure; the risks, benefits, and possible complications of the intervention; the alternatives, including doing nothing; the risk(s) and benefit(s) of the alternative treatment(s) or procedure(s); and the risk(s) and benefit(s) of doing nothing. The patient was provided information about the general risks and possible complications associated with the procedure. These may include, but are not limited to: failure to achieve desired goals, infection, bleeding, organ or nerve damage, allergic reactions, paralysis, and death. In addition, the patient was informed of those risks and complications associated to the procedure, such as failure to decrease pain; infection; bleeding; organ or nerve damage with subsequent damage to sensory, motor, and/or autonomic systems, resulting in permanent pain, numbness, and/or weakness of one or several areas of the body; allergic reactions; (i.e.: anaphylactic reaction); and/or death. Furthermore, the patient was informed of those risks and complications associated with the medications. These include, but are not limited to:  allergic reactions (i.e.: anaphylactic or anaphylactoid reaction(s)); adrenal axis suppression; blood sugar elevation that in diabetics may result in ketoacidosis or comma; water retention that in patients with history of congestive heart failure may result in shortness of breath, pulmonary edema, and decompensation with resultant heart failure; weight gain; swelling or edema; medication-induced neural toxicity; particulate matter embolism and blood vessel occlusion with resultant organ, and/or nervous system infarction; and/or aseptic necrosis of one or more joints. Finally, the patient was informed that Medicine is not an exact science; therefore, there is also the possibility of unforeseen or unpredictable risks and/or possible complications that may result in a catastrophic outcome. The patient indicated having understood very clearly. We have given the patient no guarantees and we have made no promises. Enough time was given to the patient to ask questions, all of which were answered to the patient's satisfaction. Mr. Veals has indicated that he wanted to continue with the procedure. Attestation: I, the ordering provider, attest that I have discussed with the patient the benefits, risks, side-effects, alternatives, likelihood of achieving goals, and potential problems during recovery for the procedure that I have provided informed consent. Date  Time: 05/12/2022  9:20 AM  Pre-Procedure Preparation:  Monitoring: As per clinic protocol. Respiration, ETCO2, SpO2, BP, heart rate and rhythm monitor placed and checked for adequate function Safety Precautions: Patient was assessed for positional comfort and pressure points before starting the procedure. Time-out: I initiated and conducted the "Time-out" before starting the procedure, as per protocol. The patient was asked to participate by confirming the accuracy of the "Time Out" information. Verification of the correct person, site, and procedure were  performed and confirmed by me, the nursing staff, and the patient. "Time-out" conducted as per Joint Commission's Universal Protocol (UP.01.01.01). Time: 1016  Description/Narrative of Procedure:          Target: Suprascapular nerve as it passes thru the lower portion of the suprascapular notch. Location: Suprascapular notch Region: Shoulder, suprascapular Approach: Percutaneous   Rationale (medical necessity): procedure  needed and proper for the diagnosis and/or treatment of the patient's medical symptoms and needs. Procedural Technique Safety Precautions: Aspiration looking for blood return was conducted prior to all injections. At no point did we inject any substances, as a needle was being advanced. No attempts were made at seeking any paresthesias. Safe injection practices and needle disposal techniques used. Medications properly checked for expiration dates. SDV (single dose vial) medications used. Description of the Procedure: Protocol guidelines were followed. The patient was placed in position over the procedure table. The target area was identified and the area prepped in the usual manner. The skin and muscle were infiltrated with local anesthetic. Appropriate amount of time allowed to pass for local anesthetics to take effect. Radiofrequency needles were introduced to the target area using fluoroscopic guidance. Using the  [employer]  Radiofrequency Generator, sensory stimulation using 50 Hz was used to locate & identify the nerve, making sure that the needle was positioned such that there was no sensory stimulation below 0.3 V or above 0.7 V. Stimulation using 2 Hz was used to evaluate the motor component. Care was taken not to lesion any nerves that demonstrated motor stimulation of the lower extremities at an output of less than 2.5 times that of the sensory threshold, or a maximum of 2.0 V. Once satisfactory placement of the needles was achieved, the numbing solution was slowly injected  after negative aspiration. After waiting for at least 2 minutes, the ablation was performed at 42 degrees C for 120 seconds, using Pulsed Radiofrequency settings. Once the procedure was completed, the needles were then removed and the area cleansed, making sure to leave some of the prepping solution back to take advantage of its long term bactericidal properties. Intra-operative Compliance: Compliant   Vitals:   05/12/22 1007 05/12/22 1015 05/12/22 1020 05/12/22 1024  BP: (!) 185/93 (!) 167/97 (!) 168/95 (!) 166/94  Pulse:      Resp: _0 Temp:      TempSrc:      SpO2: 95% 95% 94% 95%  Weight:      Height:        Start Time: 1016 hrs. End Time: 1023 hrs.  Materials & Medications:  Needle(s) Type: Teflon-coated, curved tip, Radiofrequency needle(s) Gauge: 22G Length: 10cm Medication(s): Please see orders for medications and dosing details.  2 cc solution made of 1 cc of Decadron, 1 cc of ropivacaine 0.2%.  Injected after completion of pulsed RFA.  Imaging Guidance (Non-Spinal):          Type of Imaging Technique: Fluoroscopy Guidance (Non-Spinal) Indication(s): Assistance in needle guidance and placement for procedures requiring needle placement in or near specific anatomical locations not easily accessible without such assistance. Exposure Time: Please see nurses notes. Contrast: Before injecting any contrast, we confirmed that the patient did not have an allergy to iodine, shellfish, or radiological contrast. Once satisfactory needle placement was completed at the desired level, radiological contrast was injected. Contrast injected under live fluoroscopy. No contrast complications. See chart for type and volume of contrast used. Fluoroscopic Guidance: I was personally present during the use of fluoroscopy. "Tunnel Vision Technique" used to obtain the best possible view of the target area. Parallax error corrected before commencing the procedure. "Direction-depth-direction"  technique used to introduce the needle under continuous pulsed fluoroscopy. Once target was reached, antero-posterior, oblique, and lateral fluoroscopic projection used confirm needle placement in all planes. Images permanently stored in EMR. Interpretation: I personally interpreted the imaging intraoperatively. Adequate needle placement  confirmed in multiple planes. Appropriate spread of contrast into desired area was observed. No evidence of afferent or efferent intravascular uptake. Permanent images saved into the patient's record.  Antibiotic Prophylaxis:   Anti-infectives (From admission, onward)    None      Indication(s): None identified  Post-operative Assessment:  Post-procedure Vital Signs:  Pulse/HCG Rate: 8577 Temp: (!) 97.4 F (36.3 C) Resp: 18 BP: (!) 166/94 SpO2: 95 %  EBL: None  Complications: No immediate post-treatment complications observed by team, or reported by patient.  Note: The patient tolerated the entire procedure well. A repeat set of vitals were taken after the procedure and the patient was kept under observation following institutional policy, for this type of procedure. Post-procedural neurological assessment was performed, showing return to baseline, prior to discharge. The patient was provided with post-procedure discharge instructions, including a section on how to identify potential problems. Should any problems arise concerning this procedure, the patient was given instructions to immediately contact us, at any time, without hesitation. In any case, we plan to contact the patient by telephone for a follow-up status report regarding this interventional procedure.  Comments:  No additional relevant information.  Plan of Care  Orders:  Orders Placed This Encounter  Procedures   DG PAIN [facility] C-ARM 1-60 MIN NO REPORT    Intraoperative interpretation by procedural physician at [place].    Standing Status:   Standing    Number of  Occurrences:   1    Order Specific Question:   Reason for exam:    Answer:   Assistance in needle guidance and placement for procedures requiring needle placement in or near specific anatomical locations not easily accessible without such assistance.     Medications ordered for procedure: Meds ordered this encounter  Medications   iohexol (OMNIPAQUE) 180 MG/ML injection 10 mL    Must be Myelogram-compatible. If not available, you may substitute with a water-soluble, non-ionic, hypoallergenic, myelogram-compatible radiological contrast medium.   dexamethasone (DECADRON) injection 10 mg   ropivacaine (PF) 2 mg/mL (0.2%) (NAROPIN) injection 9 mL   Medications administered: We administered iohexol, dexamethasone, and ropivacaine (PF) 2 mg/mL (0.2%).  See the medical record for exact dosing, route, and time of administration.  Follow-up plan:   Return in about 4 weeks (around 06/09/2022) for PPE F2F and discuss Right spring ax stim.       Right SSNB #1 08/10/21, pRFA 12/07/21, Left SSNB #1 02/17/22, Left SSN pRFA 05/12/22    Recent Visits Date Type Provider Dept  04/12/22 Office Visit Gillis Santa, MD [facility]  02/17/22 Procedure visit Gillis Santa, MD [facility]  Showing recent visits within past 90 days and meeting all other requirements Today's Visits Date Type Provider Dept  05/12/22 Procedure visit Gillis Santa, MD [facility]  Showing today's visits and meeting all other requirements Future Appointments Date Type Provider Dept  06/09/22 Appointment Gillis Santa, MD [facility]  Showing future appointments within next 90 days and meeting all other requirements  Disposition: Discharge home  Discharge (Date  Time): 05/12/2022; 1035 hrs.   Primary Care Physician: Scot Dock, [place] Primary Care Location: [facility] Note by: Gillis Santa, MD Date: 05/12/2022; Time: 10:42 AM  Disclaimer:  Medicine is  not an exact science. The only guarantee in medicine is that nothing is guaranteed. It is important to note that the decision to proceed with this intervention was based on the information collected from the patient. The Data  and conclusions were drawn from the patient's questionnaire, the interview, and the physical examination. Because the information was provided in large part by the patient, it cannot be guaranteed that it has not been purposely or unconsciously manipulated. Every effort has been made to obtain as much relevant data as possible for this evaluation. It is important to note that the conclusions that lead to this procedure are derived in large part from the available data. Always take into account that the treatment will also be dependent on availability of resources and existing treatment guidelines, considered by other Pain Management Practitioners as being common knowledge and practice, at the time of the intervention. For Medico-Legal purposes, it is also important to point out that variation in procedural techniques and pharmacological choices are the acceptable norm. The indications, contraindications, technique, and results of the above procedure should only be interpreted and judged by a Board-Certified Interventional Pain Specialist with extensive familiarity and expertise in the same exact procedure and technique.

## 2022-05-12 NOTE — Patient Instructions (Signed)

## 2022-05-13 NOTE — Telephone Encounter (Signed)
Called for post procedure check. No answer. LVM. 

## 2022-05-23 ENCOUNTER — Other Ambulatory Visit: Payer: Self-pay

## 2022-05-23 ENCOUNTER — Emergency Department: Payer: Medicare HMO

## 2022-05-23 ENCOUNTER — Inpatient Hospital Stay
Admission: EM | Admit: 2022-05-23 | Discharge: 2022-05-26 | DRG: 193 | Disposition: A | Discharge status: Home Health | Payer: Medicare HMO | Attending: Internal Medicine | Admitting: Internal Medicine

## 2022-05-23 ENCOUNTER — Inpatient Hospital Stay: Payer: Medicare HMO

## 2022-05-23 DIAGNOSIS — G4733 Obstructive sleep apnea (adult) (pediatric): Secondary | ICD-10-CM | POA: Diagnosis present

## 2022-05-23 DIAGNOSIS — Z794 Long term (current) use of insulin: Secondary | ICD-10-CM | POA: Diagnosis not present

## 2022-05-23 DIAGNOSIS — Z7984 Long term (current) use of oral hypoglycemic drugs: Secondary | ICD-10-CM

## 2022-05-23 DIAGNOSIS — Z87891 Personal history of nicotine dependence: Secondary | ICD-10-CM | POA: Diagnosis not present

## 2022-05-23 DIAGNOSIS — J101 Influenza due to other identified influenza virus with other respiratory manifestations: Secondary | ICD-10-CM | POA: Diagnosis present

## 2022-05-23 DIAGNOSIS — Y92239 Unspecified place in hospital as the place of occurrence of the external cause: Secondary | ICD-10-CM | POA: Diagnosis not present

## 2022-05-23 DIAGNOSIS — I2609 Other pulmonary embolism with acute cor pulmonale: Secondary | ICD-10-CM | POA: Diagnosis present

## 2022-05-23 DIAGNOSIS — F32A Depression, unspecified: Secondary | ICD-10-CM | POA: Diagnosis present

## 2022-05-23 DIAGNOSIS — K219 Gastro-esophageal reflux disease without esophagitis: Secondary | ICD-10-CM | POA: Diagnosis present

## 2022-05-23 DIAGNOSIS — Z881 Allergy status to other antibiotic agents status: Secondary | ICD-10-CM | POA: Diagnosis not present

## 2022-05-23 DIAGNOSIS — I110 Hypertensive heart disease with heart failure: Secondary | ICD-10-CM | POA: Diagnosis present

## 2022-05-23 DIAGNOSIS — I5031 Acute diastolic (congestive) heart failure: Secondary | ICD-10-CM | POA: Diagnosis present

## 2022-05-23 DIAGNOSIS — J9601 Acute respiratory failure with hypoxia: Secondary | ICD-10-CM | POA: Diagnosis present

## 2022-05-23 DIAGNOSIS — G4730 Sleep apnea, unspecified: Secondary | ICD-10-CM | POA: Diagnosis present

## 2022-05-23 DIAGNOSIS — Z96653 Presence of artificial knee joint, bilateral: Secondary | ICD-10-CM | POA: Diagnosis present

## 2022-05-23 DIAGNOSIS — Z6838 Body mass index (BMI) 38.0-38.9, adult: Secondary | ICD-10-CM | POA: Diagnosis not present

## 2022-05-23 DIAGNOSIS — Z841 Family history of disorders of kidney and ureter: Secondary | ICD-10-CM

## 2022-05-23 DIAGNOSIS — Z1152 Encounter for screening for COVID-19: Secondary | ICD-10-CM

## 2022-05-23 DIAGNOSIS — G609 Hereditary and idiopathic neuropathy, unspecified: Secondary | ICD-10-CM | POA: Diagnosis present

## 2022-05-23 DIAGNOSIS — W19XXXA Unspecified fall, initial encounter: Secondary | ICD-10-CM | POA: Diagnosis not present

## 2022-05-23 DIAGNOSIS — Z713 Dietary counseling and surveillance: Secondary | ICD-10-CM

## 2022-05-23 DIAGNOSIS — Z982 Presence of cerebrospinal fluid drainage device: Secondary | ICD-10-CM

## 2022-05-23 DIAGNOSIS — Z79899 Other long term (current) drug therapy: Secondary | ICD-10-CM | POA: Diagnosis not present

## 2022-05-23 DIAGNOSIS — G919 Hydrocephalus, unspecified: Secondary | ICD-10-CM | POA: Diagnosis present

## 2022-05-23 DIAGNOSIS — N2889 Other specified disorders of kidney and ureter: Secondary | ICD-10-CM | POA: Diagnosis present

## 2022-05-23 DIAGNOSIS — Z85528 Personal history of other malignant neoplasm of kidney: Secondary | ICD-10-CM | POA: Diagnosis not present

## 2022-05-23 DIAGNOSIS — Z880 Allergy status to penicillin: Secondary | ICD-10-CM | POA: Diagnosis not present

## 2022-05-23 DIAGNOSIS — E785 Hyperlipidemia, unspecified: Secondary | ICD-10-CM | POA: Diagnosis present

## 2022-05-23 DIAGNOSIS — J40 Bronchitis, not specified as acute or chronic: Secondary | ICD-10-CM | POA: Diagnosis present

## 2022-05-23 DIAGNOSIS — R0902 Hypoxemia: Secondary | ICD-10-CM | POA: Diagnosis present

## 2022-05-23 DIAGNOSIS — R531 Weakness: Secondary | ICD-10-CM

## 2022-05-23 DIAGNOSIS — F419 Anxiety disorder, unspecified: Secondary | ICD-10-CM | POA: Diagnosis present

## 2022-05-23 DIAGNOSIS — E1142 Type 2 diabetes mellitus with diabetic polyneuropathy: Secondary | ICD-10-CM | POA: Diagnosis present

## 2022-05-23 DIAGNOSIS — Z882 Allergy status to sulfonamides status: Secondary | ICD-10-CM | POA: Diagnosis not present

## 2022-05-23 DIAGNOSIS — I10 Essential (primary) hypertension: Secondary | ICD-10-CM | POA: Diagnosis present

## 2022-05-23 DIAGNOSIS — I5033 Acute on chronic diastolic (congestive) heart failure: Secondary | ICD-10-CM | POA: Diagnosis present

## 2022-05-23 DIAGNOSIS — E1165 Type 2 diabetes mellitus with hyperglycemia: Secondary | ICD-10-CM | POA: Diagnosis present

## 2022-05-23 DIAGNOSIS — G894 Chronic pain syndrome: Secondary | ICD-10-CM | POA: Diagnosis present

## 2022-05-23 DIAGNOSIS — Z8041 Family history of malignant neoplasm of ovary: Secondary | ICD-10-CM

## 2022-05-23 LAB — URINALYSIS, ROUTINE W REFLEX MICROSCOPIC
Bilirubin Urine: NEGATIVE
Glucose, UA: 50 mg/dL — AB
Ketones, ur: 5 mg/dL — AB
Leukocytes,Ua: NEGATIVE
Nitrite: NEGATIVE
Protein, ur: 100 mg/dL — AB
RBC / HPF: >50 RBC/hpf — ABNORMAL HIGH (ref 0–5)
Specific Gravity, Urine: 1.042 — ABNORMAL HIGH (ref 1.005–1.030)
pH: 5 (ref 5.0–8.0)

## 2022-05-23 LAB — RESP PANEL BY RT-PCR (RSV, FLU A&B, COVID)  RVPGX2
Influenza A by PCR: NEGATIVE
Influenza B by PCR: NEGATIVE
Resp Syncytial Virus by PCR: NEGATIVE
SARS Coronavirus 2 by RT PCR: NEGATIVE

## 2022-05-23 LAB — CBC
HCT: 40.1 % (ref 39.0–52.0)
Hemoglobin: 12.4 g/dL — ABNORMAL LOW (ref 13.0–17.0)
MCH: 28.7 pg (ref 26.0–34.0)
MCHC: 30.9 g/dL (ref 30.0–36.0)
MCV: 92.8 fL (ref 80.0–100.0)
Platelets: 218 10*3/uL (ref 150–400)
RBC: 4.32 MIL/uL (ref 4.22–5.81)
RDW: 14.8 % (ref 11.5–15.5)
WBC: 11.3 10*3/uL — ABNORMAL HIGH (ref 4.0–10.5)
nRBC: 0.2 % (ref 0.0–0.2)

## 2022-05-23 LAB — BASIC METABOLIC PANEL
Anion gap: 11 (ref 5–15)
BUN: 12 mg/dL (ref 8–23)
CO2: 24 mmol/L (ref 22–32)
Calcium: 8.6 mg/dL — ABNORMAL LOW (ref 8.9–10.3)
Chloride: 99 mmol/L (ref 98–111)
Creatinine, Ser: 0.92 mg/dL (ref 0.61–1.24)
GFR, Estimated: >60 mL/min (ref >60)
Glucose, Bld: 156 mg/dL — ABNORMAL HIGH (ref 70–99)
Potassium: 3.9 mmol/L (ref 3.5–5.1)
Sodium: 134 mmol/L — ABNORMAL LOW (ref 135–145)

## 2022-05-23 MED ORDER — CARVEDILOL 6.25 MG PO TABS
12.5000 mg | ORAL_TABLET | Freq: Two times a day (BID) | ORAL | Status: DC
  Administered 2022-05-24 – 2022-05-26 (×5): 12.5 mg via ORAL
  Filled 2022-05-23 (×5): qty 2

## 2022-05-23 MED ORDER — LOSARTAN POTASSIUM 50 MG PO TABS
100.0000 mg | ORAL_TABLET | Freq: Every day | ORAL | Status: DC
  Administered 2022-05-24 – 2022-05-26 (×3): 100 mg via ORAL
  Filled 2022-05-23 (×3): qty 2

## 2022-05-23 MED ORDER — ALBUTEROL SULFATE (2.5 MG/3ML) 0.083% IN NEBU
2.5000 mg | INHALATION_SOLUTION | Freq: Four times a day (QID) | RESPIRATORY_TRACT | Status: DC
  Administered 2022-05-24 – 2022-05-26 (×8): 2.5 mg via RESPIRATORY_TRACT
  Filled 2022-05-23 (×9): qty 3

## 2022-05-23 MED ORDER — ENOXAPARIN SODIUM 80 MG/0.8ML IJ SOSY
0.5000 mg/kg | PREFILLED_SYRINGE | INTRAMUSCULAR | Status: DC
  Administered 2022-05-23 – 2022-05-25 (×3): 80 mg via SUBCUTANEOUS
  Filled 2022-05-23 (×5): qty 0.8

## 2022-05-23 MED ORDER — IOHEXOL 350 MG/ML SOLN
100.0000 mL | Freq: Once | INTRAVENOUS | Status: AC | PRN
  Administered 2022-05-23: 100 mL via INTRAVENOUS

## 2022-05-23 MED ORDER — ALBUTEROL SULFATE (2.5 MG/3ML) 0.083% IN NEBU: 2.5000 mg | INHALATION_SOLUTION | RESPIRATORY_TRACT | Status: DC | PRN

## 2022-05-23 MED ORDER — ONDANSETRON HCL 4 MG PO TABS: 4.0000 mg | ORAL_TABLET | Freq: Four times a day (QID) | ORAL | Status: DC | PRN

## 2022-05-23 MED ORDER — LEVOFLOXACIN IN D5W 750 MG/150ML IV SOLN
750.0000 mg | Freq: Once | INTRAVENOUS | Status: AC
  Administered 2022-05-23: 750 mg via INTRAVENOUS
  Filled 2022-05-23: qty 150

## 2022-05-23 MED ORDER — GUAIFENESIN ER 600 MG PO TB12
600.0000 mg | ORAL_TABLET | Freq: Two times a day (BID) | ORAL | Status: DC
  Administered 2022-05-23 – 2022-05-26 (×6): 600 mg via ORAL
  Filled 2022-05-23 (×6): qty 1

## 2022-05-23 MED ORDER — LEVOFLOXACIN IN D5W 750 MG/150ML IV SOLN: 750.0000 mg | INTRAVENOUS | Status: DC

## 2022-05-23 MED ORDER — ROSUVASTATIN CALCIUM 20 MG PO TABS
20.0000 mg | ORAL_TABLET | Freq: Every day | ORAL | Status: DC
  Administered 2022-05-24 – 2022-05-26 (×3): 20 mg via ORAL
  Filled 2022-05-23 (×3): qty 1

## 2022-05-23 MED ORDER — INSULIN ASPART 100 UNIT/ML IJ SOLN
0.0000 [IU] | Freq: Three times a day (TID) | INTRAMUSCULAR | Status: DC
  Administered 2022-05-24: 4 [IU] via SUBCUTANEOUS
  Administered 2022-05-24: 7 [IU] via SUBCUTANEOUS
  Administered 2022-05-24: 4 [IU] via SUBCUTANEOUS
  Administered 2022-05-25 – 2022-05-26 (×4): 7 [IU] via SUBCUTANEOUS
  Filled 2022-05-23 (×7): qty 1

## 2022-05-23 MED ORDER — AMLODIPINE BESYLATE 10 MG PO TABS
10.0000 mg | ORAL_TABLET | Freq: Every day | ORAL | Status: DC
  Administered 2022-05-24 – 2022-05-26 (×3): 10 mg via ORAL
  Filled 2022-05-23: qty 1
  Filled 2022-05-23: qty 2
  Filled 2022-05-23: qty 1

## 2022-05-23 MED ORDER — DULOXETINE HCL 30 MG PO CPEP
60.0000 mg | ORAL_CAPSULE | Freq: Every day | ORAL | Status: DC
  Administered 2022-05-24 – 2022-05-26 (×3): 60 mg via ORAL
  Filled 2022-05-23 (×2): qty 2
  Filled 2022-05-23: qty 1

## 2022-05-23 MED ORDER — METHOCARBAMOL 500 MG PO TABS
750.0000 mg | ORAL_TABLET | Freq: Four times a day (QID) | ORAL | Status: DC
  Administered 2022-05-23 – 2022-05-24 (×5): 750 mg via ORAL
  Filled 2022-05-23 (×2): qty 1
  Filled 2022-05-23: qty 1.5
  Filled 2022-05-23 (×4): qty 1

## 2022-05-23 MED ORDER — ACETAMINOPHEN 325 MG PO TABS
ORAL_TABLET | ORAL | Status: AC
  Administered 2022-05-23: 650 mg
  Filled 2022-05-23: qty 2

## 2022-05-23 MED ORDER — INSULIN ASPART 100 UNIT/ML IJ SOLN: 0.0000 [IU] | Freq: Every day | INTRAMUSCULAR | Status: DC

## 2022-05-23 MED ORDER — SODIUM CHLORIDE 0.9 % IV SOLN
500.0000 mg | INTRAVENOUS | Status: DC
  Administered 2022-05-23: 500 mg via INTRAVENOUS
  Filled 2022-05-23: qty 5

## 2022-05-23 MED ORDER — SODIUM CHLORIDE 0.9 % IV BOLUS (SEPSIS)
1000.0000 mL | Freq: Once | INTRAVENOUS | Status: AC
  Administered 2022-05-23: 1000 mL via INTRAVENOUS

## 2022-05-23 MED ORDER — BUPROPION HCL ER (XL) 300 MG PO TB24
300.0000 mg | ORAL_TABLET | Freq: Every day | ORAL | Status: DC
  Administered 2022-05-24 – 2022-05-26 (×3): 300 mg via ORAL
  Filled 2022-05-23: qty 1
  Filled 2022-05-23: qty 2
  Filled 2022-05-23: qty 1

## 2022-05-23 MED ORDER — GABAPENTIN 300 MG PO CAPS
900.0000 mg | ORAL_CAPSULE | Freq: Three times a day (TID) | ORAL | Status: DC
  Administered 2022-05-23 – 2022-05-26 (×8): 900 mg via ORAL
  Filled 2022-05-23 (×8): qty 3

## 2022-05-23 MED ORDER — ONDANSETRON HCL 4 MG/2ML IJ SOLN: 4.0000 mg | Freq: Four times a day (QID) | INTRAMUSCULAR | Status: DC | PRN

## 2022-05-23 MED ORDER — IPRATROPIUM BROMIDE 0.02 % IN SOLN
0.5000 mg | Freq: Four times a day (QID) | RESPIRATORY_TRACT | Status: DC
  Administered 2022-05-24 – 2022-05-26 (×8): 0.5 mg via RESPIRATORY_TRACT
  Filled 2022-05-23 (×9): qty 2.5

## 2022-05-23 NOTE — H&P (Signed)
History and Physical    Patient: Nathan Dean ZOX:[id number] DOB: [birth year] DOA: 05/23/2022 DOS: the patient was seen and examined on 05/23/2022 PCP: Luther Hearing, MD  Patient coming from: Home  Chief Complaint: No chief complaint on file.  HPI: Nathan Dean is a 67 y.o. male with medical history significant of morbid obesity, severe peripheral neuropathy, diabetic neuropathy, type 2 diabetes, history of hydrocephalus status post shunt placement, history of renal cancer s/p treatment GERD, hyperlipidemia, essential hypertension, obstructive sleep apnea, depression with anxiety who actually walks around with a walker at home due to severe peripheral neuropathy.  Patient was using his walker today tried to go to the bathroom when he fell.  He sustained a fall backwards.  Did not hit his head.  Been pretty weak.  Patient is here with his wife who reported that he has had a number of episodes over time.  For the last 2 weeks he has been having respiratory symptoms.  He is having chest cold since he had treatment at the pain clinic.  He has been coughing.  No observed significant shortness of breath.  Patient was seen in the ER today.  Viral screen is negative for flu and COVID-19.  Chest x-ray showed no evidence of pneumonia.  CT chest angiogram however shows evidence of bronchitic changes.  Patient apparently had inhalers before but no nebulizer at home.  No formal diagnosis of COPD or asthma.  At this point he is found to be hypoxic in the ER.  Oxygen saturation was 79% on room air.  He is also felt somewhat lightheaded needs.  Patient therefore being admitted to the hospital for further evaluation of his hypoxia and treatment.  Review of Systems: As mentioned in the history of present illness. All other systems reviewed and are negative. 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    Diabetes ([place])    GERD  (gastroesophageal reflux disease)    Hyperlipidemia    Hypertension    Obesity    Sleep apnea    Past Surgical History:  Procedure Laterality Date   BACK SURGERY     in the 80's -herniated disc   BACK SURGERY     L4 bone spur 1990   CARPAL TUNNEL RELEASE Right 12/28/2016   Procedure: RIGHT ULNAR AND MEDIAN NEUROPLASTY AT WRIST;  Surgeon: Milly Jakob, MD;  Location: [facility];  Service: Orthopedics;  Laterality: Right;   CARPAL TUNNEL RELEASE Left    25 years ago   CATARACT EXTRACTION W/ INTRAOCULAR LENS IMPLANT Bilateral    REPLACEMENT TOTAL KNEE BILATERAL  2014   left 2012 rt 2014   SHOULDER OPEN ROTATOR CUFF REPAIR Right 08/07/2019   Procedure: ROTATOR CUFF REPAIR SHOULDER OPEN;  Surgeon: Thornton Park, MD;  Location: [facility];  Service: Orthopedics;  Laterality: Right;   TONSILLECTOMY     Social History:  reports that he quit smoking about 25 years ago. His smoking use included cigarettes. He has a 40.00 pack-year smoking history. He has never used smokeless tobacco. He reports current alcohol use. He reports that he does not use drugs.  Allergies  Allergen Reactions   Penicillins Anaphylaxis, Rash and Other (See Comments)   Succinylcholine Chloride Anaphylaxis and Rash   Keflex [Cephalexin] Rash   Sulfa Antibiotics Rash and Other (See Comments)    Family History  Problem Relation Age of Onset   Ovarian cancer Mother    Kidney failure Father  Cancer Brother     Prior to Admission medications   Medication Sig Start Date End Date Taking? Authorizing Provider  amLODipine (NORVASC) 10 MG tablet Take 10 mg by mouth daily. 05/23/22   [provider]  Blood Glucose Monitoring Suppl (ONE TOUCH ULTRA SYSTEM KIT) w/Device KIT 1 kit by Does not apply route once. Dx. E11.9 07/28/15   Janeann Forehand., MD  buPROPion (WELLBUTRIN XL) 300 MG 24 hr tablet Take 300 mg by mouth daily. 06/05/21   [provider]  carvedilol (COREG) 12.5 MG tablet  Take 1 tablet (12.5 mg total) by mouth 2 (two) times daily with a meal. 08/17/19   Lurene Shadow, MD  doxycycline (VIBRA-TABS) 100 MG tablet Take 1 tablet (100 mg total) by mouth 2 (two) times daily. 03/03/21   Felecia Shelling, DPM  DULoxetine (CYMBALTA) 60 MG capsule Take 60 mg by mouth daily. 06/01/21   [provider]  gabapentin (NEURONTIN) 300 MG capsule Take 3 capsules (900 mg total) by mouth 3 (three) times daily. 10/21/18   Wieting, Richard, MD  glucose blood test strip 1 each by Other route 4 (four) times daily. Use with One touch meter to check blood sugar. Dx E11.9 09/13/16   Galen Manila, NP  insulin aspart (NOVOLOG) 100 UNIT/ML FlexPen Inject 100 Units into the skin as directed.    [provider]  Insulin Pen Needle (BD PEN NEEDLE NANO U/F) 32G X 4 MM MISC Inject 1 each as directed 3 (three) times daily. 04/18/18   Doristine Bosworth, MD  loratadine (CLARITIN) 10 MG tablet Take 10 mg by mouth daily as needed. 06/01/21   [provider]  losartan (COZAAR) 100 MG tablet Take 1 tablet (100 mg total) by mouth daily. 05/01/19   Georgina Quint, MD  Melatonin 10 MG TABS Take 20 mg by mouth at bedtime as needed (sleep).    [provider]  metFORMIN (GLUCOPHAGE) 500 MG tablet Take 1,000 mg by mouth 2 (two) times daily with a meal.    [provider]  methocarbamol (ROBAXIN) 750 MG tablet Take 750 mg by mouth 4 (four) times daily.    [provider]  omeprazole (PRILOSEC) 20 MG capsule Take 20 mg by mouth daily.     [provider]  ONE TOUCH LANCETS MISC 1 each by Does not apply route 4 (four) times daily. Use with one touch meter to check blood sugar. Dx: E11.9 09/16/16   Galen Manila, NP  rosuvastatin (CRESTOR) 20 MG tablet Take 20 mg by mouth daily.    [provider]  senna-docusate (SENOKOT-S) 8.6-50 MG tablet Take 2 tablets by mouth daily. 06/01/21   [provider]    Physical Exam: Vitals:    05/23/22 1712 05/23/22 1713 05/23/22 2045 05/23/22 2052  BP: (!) 167/84  (!) 154/79   Pulse: 92  78   Resp: 18  16   Temp: (!) 102.6 F (39.2 C)   98.2 F (36.8 C)  TempSrc: Oral   Oral  SpO2: (!) 79%  94%   Weight:  (!) 161 kg    Height:  6\' 9"  (2.057 m)     Constitutional: Morbidly obese, laying in bed, no distress, calm, comfortable Eyes: PERRL, lids and conjunctivae normal ENMT: Mucous membranes are moist. Posterior pharynx clear of any exudate or lesions.Normal dentition.  Neck: normal, supple, no masses, no thyromegaly Respiratory: Coarse breath sounds bilaterally, no wheezing, no crackles. Normal respiratory effort. No accessory muscle  use.  Cardiovascular: Sinus tachycardia, no murmurs / rubs / gallops. No extremity edema. 2+ pedal pulses. No carotid bruits.  Abdomen: no tenderness, no masses palpated. No hepatosplenomegaly. Bowel sounds positive.  Musculoskeletal: Good range of motion, no joint swelling or tenderness, Skin: no rashes, lesions, ulcers. No induration Neurologic: CN 2-12 grossly intact. Sensation intact, DTR normal. Strength 5/5 in all 4.  Psychiatric: Normal judgment and insight. Alert and oriented x 3. Normal mood  Data Reviewed:  Blood pressure 167/84, temperature 102.6, oxygen sat 79% on room air.  Sodium 134 glucose 156 and calcium 8.6.  White count is 11.3 hemoglobin 12.4.  Acute viral screen is negative.  Urinalysis showed WBC 11-20 RBC more than 50.  Nitrite negative leukocytes negative.  Head CT without contrast showed no acute findings.  CT angiogram of the chest showed no evidence of PE but bilateral bronchial wall thickening which is nonspecific but could be infectious inflammatory or chronic aspiration.  Multiple findings on CT abdomen which appears to be chronic.  Assessment and Plan:  #1 acute hypoxic respiratory failure: Most likely due to acute viral illness although none of the typical respiratory viruses were positive.  No evidence of pulmonary  infiltrates.  Patient has a bronchitic type picture.  He has a fever and leukocytosis also.  Patient has obstructive sleep apnea and with his size could have had restrictive lung disease secondary to obesity hypoventilation syndrome.  At this point he is not quite clear.  Wife said he typically has oxygen saturation of around 90% on room air.  Patient will be admitted and initiated on treatment for bronchitis.  Initiate nebulizer treatment with antibiotics.  Patient is currently not wheezing.  Steroids could be an option to be added if no improvement with the nebulizer treatment.  Ultimately he may require home oxygen if he remains hypoxic after treatment.  #2 SIRS: No source of infection but patient appears to have SIRS criteria.  Hydrate the patient.  Empirically on azithromycin for possible respiratory infection.  UTI less likely based on the findings.  May consider additional Rocephin.  Patient has urinary symptoms.  Blood cultures to be obtained and follow-up.  #3 diabetes: Non-insulin-dependent.  Sliding scale insulin started.  #4 essential hypertension: Confirm on resume home regimen  #5 severe neuropathy: Takes Neurontin 900 mg 3 times daily.  Also Robaxin.  We will resume.  #6 history of hydrocephalus: Patient has a shunt in place.  Head CT showed no obvious abnormalities.  #7 history of renal cell tumor: CT findings consistent with previous interventions.  Defer to his urologist.  #8 morbid obesity: Dietary counseling  #9 depression with anxiety: Continue home regimen  #10 hyperlipidemia: Confirm on resume home regimen  #11 chronic pain syndrome: Patient goes to the pain clinic for treatment.  #12 GERD: Continue PPIs    Advance Care Planning:   Code Status: Full Code full code  Consults: None  Family Communication: Wife at bedside  Severity of Illness: The appropriate patient status for this patient is INPATIENT. Inpatient status is judged to be reasonable and necessary in  order to provide the required intensity of service to ensure the patient's safety. The patient's presenting symptoms, physical exam findings, and initial radiographic and laboratory data in the context of their chronic comorbidities is felt to place them at high risk for further clinical deterioration. Furthermore, it is not anticipated that the patient will be medically stable for discharge from the hospital within 2 midnights of admission.   * I  certify that at the point of admission it is my clinical judgment that the patient will require inpatient hospital care spanning beyond 2 midnights from the point of admission due to high intensity of service, high risk for further deterioration and high frequency of surveillance required.*  AuthorLonia Blood, MD 05/23/2022 10:30 PM  For on call review [email or web address].

## 2022-05-23 NOTE — ED Triage Notes (Signed)
Pt in via EMS from home with c/o weakness. EMS reports they were originally called out for assistance with moving. 102.9 temp, 88% RA, pt placed on 4L and now at 92%.

## 2022-05-23 NOTE — ED Provider Notes (Signed)
[facility] Provider Note    Event Date/Time   First MD Initiated Contact with Patient 05/23/22 2105     (approximate)   History   Chief Complaint: Shortness of breath  HPI  Nathan Dean is a 66 y.o. male with a history of hypertension diabetes CHF VP shunt who comes ED complaining of generalized weakness worsening for the past 2 weeks.  Today he had a fall and was unable to get himself up.  He normally uses a walker to ambulate.  No head trauma.  Denies chest pain but has had progressive shortness of breath during this time.  EMS report initial temperature of 103, oxygenation 88% on room air.     Physical Exam   Triage Vital Signs: ED Triage Vitals  Enc Vitals Group     BP 05/23/22 1712 (!) 167/84     Pulse Rate 05/23/22 1712 92     Resp 05/23/22 1712 18     Temp 05/23/22 1712 (!) 102.6 F (39.2 C)     Temp Source 05/23/22 1712 Oral     SpO2 05/23/22 1712 (!) 79 %     Weight 05/23/22 1713 (!) 355 lb (161 kg)     Height 05/23/22 1713 6\' 9"  (2.057 m)     Head Circumference --      Peak Flow --      Pain Score 05/23/22 1713 0     Pain Loc --      Pain Edu? --      Excl. in [place]? --     Most recent vital signs: Vitals:   05/23/22 2045 05/23/22 2052  BP: (!) 154/79   Pulse: 78   Resp: 16   Temp:  98.2 F (36.8 C)  SpO2: 94%     General: Awake, no distress.  CV:  Good peripheral perfusion.  Resp:  Normal effort.  Clear to auscultation bilaterally Abd:  No distention.  Soft, mild generalized tenderness Other:  No evidence of head trauma.  No spinal tenderness.   ED Results / Procedures / Treatments   Labs (all labs ordered are listed, but only abnormal results are displayed) Labs Reviewed  CBC - Abnormal; Notable for the following components:      Result Value   WBC 11.3 (*)    Hemoglobin 12.4 (*)    All other components within normal limits  URINALYSIS, ROUTINE W REFLEX MICROSCOPIC - Abnormal; Notable for the following  components:   Color, Urine YELLOW (*)    APPearance HAZY (*)    Specific Gravity, Urine 1.042 (*)    Glucose, UA 50 (*)    Hgb urine dipstick MODERATE (*)    Ketones, ur 5 (*)    Protein, ur 100 (*)    RBC / HPF >50 (*)    Bacteria, UA RARE (*)    All other components within normal limits  BASIC METABOLIC PANEL - Abnormal; Notable for the following components:   Sodium 134 (*)    Glucose, Bld 156 (*)    Calcium 8.6 (*)    All other components within normal limits  RESP PANEL BY RT-PCR (RSV, FLU A&B, COVID)  RVPGX2  CULTURE, BLOOD (ROUTINE X 2)  CULTURE, BLOOD (ROUTINE X 2)  HEMOGLOBIN A1C  HIV ANTIBODY (ROUTINE TESTING W REFLEX)  CBC  CREATININE, SERUM  COMPREHENSIVE METABOLIC PANEL  CBC  CBG MONITORING, ED     EKG Interpreted by me Normal sinus rhythm rate of 92.  Normal axis and  intervals.  Poor R wave progression.  Normal ST segments and T waves.   RADIOLOGY Chest x-ray interpreted by me, appears normal.  Radiology report reviewed.  CT scans of head, chest, abdomen pelvis unremarkable.   PROCEDURES:  Procedures   MEDICATIONS ORDERED IN ED: Medications  levofloxacin (LEVAQUIN) IVPB 750 mg (750 mg Intravenous New Bag/Given 05/23/22 2208)  levofloxacin (LEVAQUIN) IVPB 750 mg (has no administration in time range)  amLODipine (NORVASC) tablet 10 mg (has no administration in time range)  carvedilol (COREG) tablet 12.5 mg (has no administration in time range)  losartan (COZAAR) tablet 100 mg (has no administration in time range)  rosuvastatin (CRESTOR) tablet 20 mg (has no administration in time range)  buPROPion (WELLBUTRIN XL) 24 hr tablet 300 mg (has no administration in time range)  DULoxetine (CYMBALTA) DR capsule 60 mg (has no administration in time range)  gabapentin (NEURONTIN) capsule 900 mg (900 mg Oral Given 05/23/22 2314)  methocarbamol (ROBAXIN) tablet 750 mg (has no administration in time range)  insulin aspart (novoLOG) injection 0-20 Units (has no  administration in time range)  insulin aspart (novoLOG) injection 0-5 Units (has no administration in time range)  enoxaparin (LOVENOX) injection 80 mg (has no administration in time range)  ondansetron (ZOFRAN) tablet 4 mg (has no administration in time range)    Or  ondansetron (ZOFRAN) injection 4 mg (has no administration in time range)  ipratropium (ATROVENT) nebulizer solution 0.5 mg (has no administration in time range)  guaiFENesin (MUCINEX) 12 hr tablet 600 mg (600 mg Oral Given 05/23/22 2314)  albuterol (PROVENTIL) (2.5 MG/3ML) 0.083% nebulizer solution 2.5 mg (has no administration in time range)  albuterol (PROVENTIL) (2.5 MG/3ML) 0.083% nebulizer solution 2.5 mg (has no administration in time range)  azithromycin (ZITHROMAX) 500 mg in sodium chloride 0.9 % 250 mL IVPB (has no administration in time range)  acetaminophen (TYLENOL) 325 MG tablet (has no administration in time range)  iohexol (OMNIPAQUE) 350 MG/ML injection 100 mL (100 mLs Intravenous Contrast Given 05/23/22 1952)  sodium chloride 0.9 % bolus 1,000 mL (0 mLs Intravenous Stopped 05/23/22 2315)     IMPRESSION / MDM / ASSESSMENT AND PLAN / ED COURSE  I reviewed the triage vital signs and the nursing notes.                              Differential diagnosis includes, but is not limited to, pneumonia, pleural effusion, pulmonary edema, CHF, viral illness, electrolyte abnormality, anemia, dehydration  Patient's presentation is most consistent with acute presentation with potential threat to life or bodily function.  Patient presents with hypoxia, progressive generalized weakness.  No signs of shock, but he is febrile.  Not septic.  Workup overall unrevealing.  No PE, no serious intra-abdominal pathology.  Suspect commendation of viral illness and obesity hypoventilation syndrome causing respiratory failure.  Case discussed with hospitalist for further management.       FINAL CLINICAL IMPRESSION(S) / ED DIAGNOSES    Final diagnoses:  Acute respiratory failure with hypoxia (HCC)  Morbid obesity (HCC)  Generalized weakness     Rx / DC Orders   ED Discharge Orders     None        Note:  This document was prepared using Dragon voice recognition software and may include unintentional dictation errors.   Sharman Cheek, MD 05/23/22 2326

## 2022-05-23 NOTE — ED Notes (Signed)
This RN attempted IV. Unsuccessful.

## 2022-05-23 NOTE — Progress Notes (Signed)
Anticoagulation monitoring(Lovenox):  67 yo  male ordered Lovenox 40 mg Q24h    Filed Weights   05/23/22 1713  Weight: (!) 161 kg (355 lb)   BMI 38   Lab Results  Component Value Date   CREATININE 0.92 05/23/2022   CREATININE 0.82 01/18/2020   CREATININE 0.70 08/17/2019   Estimated Creatinine Clearance: 136 mL/min (by C-G formula based on SCr of 0.92 mg/dL). Hemoglobin & Hematocrit     Component Value Date/Time   HGB 12.4 (L) 05/23/2022 1715   HGB 16.0 12/23/2014 1439   HCT 40.1 05/23/2022 1715   HCT 49.2 12/23/2014 1439     Per Protocol for Patient with estCrcl > 30 ml/min and BMI > 30, will transition to Lovenox 80 mg Q24h.

## 2022-05-23 NOTE — Consult Note (Signed)
Pharmacy Antibiotic Note  Nathan Dean is a 67 y.o. male admitted on 05/23/2022 with pneumonia.  Pharmacy has been consulted for levofloxacin dosing.  Plan: Start Levofloxacin 750 mg IV every 24 hours  Height: 6\' 9"  (205.7 cm) Weight: (!) 161 kg (355 lb) IBW/kg (Calculated) : 98.3  Temp (24hrs), Avg:100.4 F (38 C), Min:98.2 F (36.8 C), Max:102.6 F (39.2 C)  Recent Labs  Lab 05/23/22 1715 05/23/22 1818  WBC 11.3*  --   CREATININE  --  0.92    Estimated Creatinine Clearance: 136 mL/min (by C-G formula based on SCr of 0.92 mg/dL).    Allergies  Allergen Reactions   Penicillins Anaphylaxis, Rash and Other (See Comments)   Succinylcholine Chloride Anaphylaxis and Rash   Keflex [Cephalexin] Rash   Sulfa Antibiotics Rash and Other (See Comments)    Antimicrobials this admission: levofloxacin 1/7 >>    Dose adjustments this admission: N/A  Microbiology results: N/A  Thank you for allowing pharmacy to be a part of this patient's care.  Lorin Picket, PharmD 05/23/2022 9:17 PM

## 2022-05-23 NOTE — ED Triage Notes (Signed)
Pt to ED from home for weakness x 2 weeks. Pt called 911 today for a fall. Pt uses a walker and missed the toilet and fell. Pt denies hitting head and denies LOC. Pt wife here as well. Pt wife advised he has been sick x 2 weeks and has not seen a MD for same. Pt is CAOx4. Pt has VP shunt that was placed at [facility] where he receives his care there.

## 2022-05-24 ENCOUNTER — Encounter: Payer: Self-pay | Admitting: Internal Medicine

## 2022-05-24 DIAGNOSIS — R0902 Hypoxemia: Secondary | ICD-10-CM | POA: Diagnosis not present

## 2022-05-24 LAB — COMPREHENSIVE METABOLIC PANEL
ALT: 15 U/L (ref 0–44)
ALT: 13 U/L (ref 0–44)
AST: 25 U/L (ref 15–41)
AST: 21 U/L (ref 15–41)
Albumin: 3.3 g/dL — ABNORMAL LOW (ref 3.5–5.0)
Albumin: 3.1 g/dL — ABNORMAL LOW (ref 3.5–5.0)
Alkaline Phosphatase: 57 U/L (ref 38–126)
Alkaline Phosphatase: 53 U/L (ref 38–126)
Anion gap: 9 (ref 5–15)
Anion gap: 10 (ref 5–15)
BUN: 11 mg/dL (ref 8–23)
BUN: 12 mg/dL (ref 8–23)
CO2: 25 mmol/L (ref 22–32)
CO2: 25 mmol/L (ref 22–32)
Calcium: 8.3 mg/dL — ABNORMAL LOW (ref 8.9–10.3)
Calcium: 8.3 mg/dL — ABNORMAL LOW (ref 8.9–10.3)
Chloride: 100 mmol/L (ref 98–111)
Chloride: 101 mmol/L (ref 98–111)
Creatinine, Ser: 0.8 mg/dL (ref 0.61–1.24)
Creatinine, Ser: 0.77 mg/dL (ref 0.61–1.24)
GFR, Estimated: >60 mL/min (ref >60)
GFR, Estimated: >60 mL/min (ref >60)
Glucose, Bld: 197 mg/dL — ABNORMAL HIGH (ref 70–99)
Glucose, Bld: 195 mg/dL — ABNORMAL HIGH (ref 70–99)
Potassium: 3.6 mmol/L (ref 3.5–5.1)
Potassium: 3.6 mmol/L (ref 3.5–5.1)
Sodium: 134 mmol/L — ABNORMAL LOW (ref 135–145)
Sodium: 136 mmol/L (ref 135–145)
Total Bilirubin: 1.1 mg/dL (ref 0.3–1.2)
Total Bilirubin: 1 mg/dL (ref 0.3–1.2)
Total Protein: 6.7 g/dL (ref 6.5–8.1)
Total Protein: 6.4 g/dL — ABNORMAL LOW (ref 6.5–8.1)

## 2022-05-24 LAB — CBG MONITORING, ED
Glucose-Capillary: 189 mg/dL — ABNORMAL HIGH (ref 70–99)
Glucose-Capillary: 183 mg/dL — ABNORMAL HIGH (ref 70–99)
Glucose-Capillary: 228 mg/dL — ABNORMAL HIGH (ref 70–99)
Glucose-Capillary: 190 mg/dL — ABNORMAL HIGH (ref 70–99)

## 2022-05-24 LAB — CBC
HCT: 37.2 % — ABNORMAL LOW (ref 39.0–52.0)
Hemoglobin: 11.5 g/dL — ABNORMAL LOW (ref 13.0–17.0)
MCH: 28.3 pg (ref 26.0–34.0)
MCHC: 30.9 g/dL (ref 30.0–36.0)
MCV: 91.6 fL (ref 80.0–100.0)
Platelets: 181 10*3/uL (ref 150–400)
RBC: 4.06 MIL/uL — ABNORMAL LOW (ref 4.22–5.81)
RDW: 14.8 % (ref 11.5–15.5)
WBC: 9.6 10*3/uL (ref 4.0–10.5)
nRBC: 0 % (ref 0.0–0.2)

## 2022-05-24 LAB — RESPIRATORY PANEL BY PCR
Adenovirus: NOT DETECTED
Bordetella Parapertussis: NOT DETECTED
Bordetella pertussis: NOT DETECTED
Chlamydophila pneumoniae: NOT DETECTED
Coronavirus 229E: NOT DETECTED
Coronavirus HKU1: NOT DETECTED
Coronavirus NL63: NOT DETECTED
Coronavirus OC43: NOT DETECTED
Influenza A: DETECTED — AB
Influenza B: NOT DETECTED
Metapneumovirus: NOT DETECTED
Mycoplasma pneumoniae: NOT DETECTED
Parainfluenza Virus 1: NOT DETECTED
Parainfluenza Virus 2: NOT DETECTED
Parainfluenza Virus 3: NOT DETECTED
Parainfluenza Virus 4: NOT DETECTED
Respiratory Syncytial Virus: NOT DETECTED
Rhinovirus / Enterovirus: NOT DETECTED

## 2022-05-24 LAB — GLUCOSE, CAPILLARY: Glucose-Capillary: 144 mg/dL — ABNORMAL HIGH (ref 70–99)

## 2022-05-24 LAB — HEMOGLOBIN A1C
Hgb A1c MFr Bld: 7.5 % — ABNORMAL HIGH (ref 4.8–5.6)
Mean Plasma Glucose: 169 mg/dL

## 2022-05-24 LAB — HIV ANTIBODY (ROUTINE TESTING W REFLEX): HIV Screen 4th Generation wRfx: NONREACTIVE

## 2022-05-24 MED ORDER — ACETAMINOPHEN 325 MG PO TABS
650.0000 mg | ORAL_TABLET | Freq: Once | ORAL | Status: AC
  Administered 2022-05-24: 650 mg via ORAL
  Filled 2022-05-24: qty 2

## 2022-05-24 MED ORDER — INSULIN GLARGINE-YFGN 100 UNIT/ML ~~LOC~~ SOLN
20.0000 [IU] | Freq: Every day | SUBCUTANEOUS | Status: DC
  Administered 2022-05-24: 20 [IU] via SUBCUTANEOUS
  Filled 2022-05-24: qty 0.2

## 2022-05-24 MED ORDER — HYDRALAZINE HCL 20 MG/ML IJ SOLN: 10.0000 mg | Freq: Four times a day (QID) | INTRAMUSCULAR | Status: DC | PRN

## 2022-05-24 MED ORDER — OSELTAMIVIR PHOSPHATE 75 MG PO CAPS
75.0000 mg | ORAL_CAPSULE | Freq: Two times a day (BID) | ORAL | Status: DC
  Administered 2022-05-24 – 2022-05-26 (×4): 75 mg via ORAL
  Filled 2022-05-24 (×4): qty 1

## 2022-05-24 MED ORDER — PANTOPRAZOLE SODIUM 40 MG PO TBEC
40.0000 mg | DELAYED_RELEASE_TABLET | Freq: Every day | ORAL | Status: DC
  Administered 2022-05-24 – 2022-05-26 (×3): 40 mg via ORAL
  Filled 2022-05-24 (×3): qty 1

## 2022-05-24 NOTE — Progress Notes (Signed)
PROGRESS NOTE    Nathan Dean  EPP:[id number] DOB: 1955-08-22 DOA: 05/23/2022 PCP: Luther Hearing, MD  [id number]/[id number]  LOS: 1 day   Brief hospital course:   Assessment & Plan: Nathan Dean is a 67 y.o. male with medical history significant of morbid obesity, severe peripheral neuropathy, diabetic neuropathy, type 2 diabetes, history of hydrocephalus status post shunt placement, history of renal cancer s/p treatment GERD, hyperlipidemia, essential hypertension, obstructive sleep apnea, depression with anxiety who actually walks around with a walker at home due to severe peripheral neuropathy.  Patient was using his walker today tried to go to the bathroom when he fell.   For the last 2 weeks he has been having respiratory symptoms.  found to be hypoxic in the ER. Oxygen saturation was 79% on room air.    #1 acute hypoxic respiratory failure 2/2 Flu A --Not on home O2.  Wife said he typically has oxygen saturation of around 90% on room air.  O2 sat 79% on room air on presentation, was put on 4L O2. --since high fever, most likely viral illness.  Resp panel neg on presentation, so RVP ordered next day which was pos for Flu A. Plan: --start Tamiflu --d/c Levaquin --Continue supplemental O2 to keep sats >=90%, wean as tolerated   #3 diabetes, insulin-dependent.   --start glargine 20u nightly --SSI   #4 essential hypertension:  --cont amlodipine, coreg, losartan   #5 severe neuropathy:  --cont Neurontin 900 mg 3 times daily. --cont Robaxin   #6 history of hydrocephalus:  Patient has a shunt in place.  Head CT showed no obvious abnormalities.   #7 history of renal cell tumor:  CT showed A 2.8 cm mass in the lateral midpole of the right kidney measures slightly above simple fluid attenuation and contains a single 2 mm calcification. This likely reflects sequela of prior injury/scar, however a malignancy is difficult to exclude. --Recommend nonemergent renal ultrasound     #8 morbid obesity, BMI 38   #9 depression with anxiety: Continue home regimen   #10 hyperlipidemia:  --cont statin   #11 chronic pain syndrome:  Patient goes to the pain clinic for treatment.   #12 GERD:  Continue PPIs   DVT prophylaxis: Lovenox SQ Code Status: Full code  Family Communication: wife updated at bedside today Level of care: Telemetry Medical Dispo:   The patient is from: home Anticipated d/c is to: home Anticipated d/c date is: 1-2 days   Subjective and Interval History:  Pt reported feeling better.  Still needs 2L to stay in 90's O2 sats.   Objective: Vitals:   05/24/22 1315 05/24/22 1330 05/24/22 1400 05/24/22 1552  BP: 131/76 136/68 137/65   Pulse: 72 72 73   Resp: 19 (!) 21 (!) 21   Temp:    98 F (36.7 C)  TempSrc:    Oral  SpO2: 94% 92% 95%   Weight:      Height:        Intake/Output Summary (Last 24 hours) at 05/24/2022 1734 Last data filed at 05/24/2022 0838 Gross per 24 hour  Intake --  Output 650 ml  Net -650 ml   Filed Weights   05/23/22 1713  Weight: (!) 161 kg    Examination:   Constitutional: NAD, AAOx3 HEENT: conjunctivae and lids normal, EOMI CV: No cyanosis.   RESP: normal respiratory effort, clear lung sounds, on 2L Extremities: Mild edema in BLE SKIN: warm, dry Neuro: II - XII grossly intact.   Psych: Normal mood  and affect.     Data Reviewed: I have personally reviewed labs and imaging studies  Time spent: 50 minutes  Darlin Priestly, MD [facility] If 7PM-7AM, please contact night-coverage 05/24/2022, 5:34 PM

## 2022-05-24 NOTE — ED Notes (Signed)
Informed RN bed assigned 

## 2022-05-25 DIAGNOSIS — R0902 Hypoxemia: Secondary | ICD-10-CM | POA: Diagnosis not present

## 2022-05-25 LAB — BASIC METABOLIC PANEL
Anion gap: 8 (ref 5–15)
BUN: 14 mg/dL (ref 8–23)
CO2: 27 mmol/L (ref 22–32)
Calcium: 8.1 mg/dL — ABNORMAL LOW (ref 8.9–10.3)
Chloride: 100 mmol/L (ref 98–111)
Creatinine, Ser: 0.79 mg/dL (ref 0.61–1.24)
GFR, Estimated: >60 mL/min (ref >60)
Glucose, Bld: 208 mg/dL — ABNORMAL HIGH (ref 70–99)
Potassium: 3.5 mmol/L (ref 3.5–5.1)
Sodium: 135 mmol/L (ref 135–145)

## 2022-05-25 LAB — CBC
HCT: 34.9 % — ABNORMAL LOW (ref 39.0–52.0)
Hemoglobin: 10.8 g/dL — ABNORMAL LOW (ref 13.0–17.0)
MCH: 28.2 pg (ref 26.0–34.0)
MCHC: 30.9 g/dL (ref 30.0–36.0)
MCV: 91.1 fL (ref 80.0–100.0)
Platelets: 191 10*3/uL (ref 150–400)
RBC: 3.83 MIL/uL — ABNORMAL LOW (ref 4.22–5.81)
RDW: 15.1 % (ref 11.5–15.5)
WBC: 8 10*3/uL (ref 4.0–10.5)
nRBC: 0 % (ref 0.0–0.2)

## 2022-05-25 LAB — GLUCOSE, CAPILLARY
Glucose-Capillary: 208 mg/dL — ABNORMAL HIGH (ref 70–99)
Glucose-Capillary: 234 mg/dL — ABNORMAL HIGH (ref 70–99)
Glucose-Capillary: 206 mg/dL — ABNORMAL HIGH (ref 70–99)
Glucose-Capillary: 198 mg/dL — ABNORMAL HIGH (ref 70–99)

## 2022-05-25 LAB — MAGNESIUM: Magnesium: 2.3 mg/dL (ref 1.7–2.4)

## 2022-05-25 MED ORDER — METHOCARBAMOL 500 MG PO TABS
750.0000 mg | ORAL_TABLET | Freq: Four times a day (QID) | ORAL | Status: DC | PRN
  Administered 2022-05-26: 750 mg via ORAL
  Filled 2022-05-25: qty 2

## 2022-05-25 MED ORDER — GUAIFENESIN-DM 100-10 MG/5ML PO SYRP
5.0000 mL | ORAL_SOLUTION | ORAL | Status: DC | PRN
  Administered 2022-05-25 – 2022-05-26 (×2): 5 mL via ORAL
  Filled 2022-05-25 (×2): qty 10

## 2022-05-25 MED ORDER — INSULIN GLARGINE-YFGN 100 UNIT/ML ~~LOC~~ SOLN
25.0000 [IU] | Freq: Every day | SUBCUTANEOUS | Status: DC
  Administered 2022-05-25: 25 [IU] via SUBCUTANEOUS
  Filled 2022-05-25: qty 0.25

## 2022-05-25 MED ORDER — MENTHOL 3 MG MT LOZG
1.0000 | LOZENGE | OROMUCOSAL | Status: DC | PRN
  Administered 2022-05-25: 3 mg via ORAL
  Filled 2022-05-25: qty 9

## 2022-05-25 NOTE — Inpatient Diabetes Management (Signed)
Inpatient Diabetes Program Recommendations  AACE/ADA: New Consensus Statement on Inpatient Glycemic Control (2015)  Target Ranges:  Prepandial:   less than 140 mg/dL      Peak postprandial:   less than 180 mg/dL (1-2 hours)      Critically ill patients:  140 - 180 mg/dL   Lab Results  Component Value Date   GLUCAP 208 (H) 05/25/2022   HGBA1C 7.5 (H) 05/23/2022    Review of Glycemic Control  Latest Reference Range & Units 05/24/22 17:00 05/24/22 21:02 05/25/22 07:38  Glucose-Capillary 70 - 99 mg/dL 190 (H) 144 (H) 208 (H)  (H): Data is abnormally high Diabetes history: Type 2 DM Outpatient Diabetes medications: Lantus 30 units QHS, Humalog 18 units BID, Metformin 1000 mg BID Current orders for Inpatient glycemic control: Novolog 0-20 units TID & HS, Semglee 20 units QHS  Inpatient Diabetes Program Recommendations:    Consider increasing Semglee to 25 units QHS.   Thanks, Bronson Curb, MSN, RNC-OB Diabetes Coordinator [phone] (8a-5p)

## 2022-05-25 NOTE — Progress Notes (Signed)
PROGRESS NOTE    Nathan Dean  HGD:[id number] DOB: 1956/02/05 DOA: 05/23/2022 PCP: Lorrine Kin, MD  [id number]/119A-AA  LOS: 2 days   Brief hospital course:   Assessment & Plan: Nathan Dean is a 67 y.o. male with medical history significant of morbid obesity, severe peripheral neuropathy, diabetic neuropathy, type 2 diabetes, history of hydrocephalus status post shunt placement, history of renal cancer s/p treatment, essential hypertension, obstructive sleep apnea, depression with anxiety who actually walks around with a walker at home due to severe peripheral neuropathy.  Patient was using his walker tried to go to the bathroom when he fell.   For the last 2 weeks he has been having respiratory symptoms.  found to be hypoxic in the ER. Oxygen saturation was 79% on room air.    #1 acute hypoxic respiratory failure 2/2 Flu A --Not on home O2.  Wife said he typically has oxygen saturation of around 90% on room air.  O2 sat 79% on room air on presentation, was put on 4L O2. --since high fever, most likely viral illness.  Resp panel neg on presentation, so RVP ordered next day which was pos for Flu A.  Empiric Levaquin d/c'ed. Plan: --cont Tamiflu --cont scheduled nebs --Continue supplemental O2 to keep sats >=90%, wean as tolerated --will perform ambulation O2 sats test prior to discharge.   #3 diabetes, insulin-dependent.   --increase glargine to 25u nightly --SSI   #4 essential hypertension:  --cont amlodipine, coreg, losartan   #5 severe neuropathy:  --cont Neurontin 900 mg 3 times daily. --cont Robaxin as PRN   #6 history of hydrocephalus:  Patient has a shunt in place.  Head CT showed no obvious abnormalities.   #7 history of renal cell tumor:  CT showed A 2.8 cm mass in the lateral midpole of the right kidney measures slightly above simple fluid attenuation and contains a single 2 mm calcification. This likely reflects sequela of prior injury/scar, however a  malignancy is difficult to exclude. --Recommend nonemergent renal ultrasound    #8 morbid obesity, BMI 38   #9 depression with anxiety:  Continue home Wellbutrin and Cymbalta   #10 hyperlipidemia:  --cont statin   #11 chronic pain syndrome:  Patient goes to the pain clinic for treatment.   #12 GERD:  Continue PPIs   DVT prophylaxis: Lovenox SQ Code Status: Full code  Family Communication: wife updated at bedside today Level of care: Telemetry Medical Dispo:   The patient is from: home Anticipated d/c is to: home Anticipated d/c date is: 1-2 days.  PT ordered, prefer to wean down to RA prior to discharge, otherwise will need home O2 qualifying test.   Subjective and Interval History:  RN messaged this morning that "he is SOB but his sats are ok he is still on 3LPM he said he feels he could benefit staying another night here if he could he said he is so sick."    I replied at 9:13 am that "I didn't tell pt I was going to discharge him today. He did present with hypoxia, and on 3L O2 now, so yes, I can keep him another night."  When I rounded on pt later in the day, pt asked when he could go home.     Objective: Vitals:   05/25/22 0450 05/25/22 0737 05/25/22 1637 05/25/22 1652  BP: 137/72 (!) 145/68  122/81  Pulse: 70 70  68  Resp: 18 16    Temp: 98.7 F (37.1 C) (!) 96.8 F (36  C)  99.6 F (37.6 C)  TempSrc: Oral   Oral  SpO2: 94% 98% 95% 93%  Weight:      Height:        Intake/Output Summary (Last 24 hours) at 05/25/2022 1957 Last data filed at 05/25/2022 0730 Gross per 24 hour  Intake --  Output 800 ml  Net -800 ml   Filed Weights   05/23/22 1713 05/24/22 2049  Weight: (!) 161 kg (!) 159 kg    Examination:   Constitutional: NAD, alert, oriented to person and place, but seemed to have some difficulty recalling events HEENT: conjunctivae and lids normal, EOMI CV: No cyanosis.   RESP: normal respiratory effort, no obviously wheezing, on 3L Extremities:  edema in BLE SKIN: warm, dry Neuro: II - XII grossly intact.     Data Reviewed: I have personally reviewed labs and imaging studies  Time spent: 35 minutes  Enzo Bi, MD [facility] If 7PM-7AM, please contact night-coverage 05/25/2022, 7:57 PM

## 2022-05-25 NOTE — Evaluation (Addendum)
Physical Therapy Evaluation Patient Details Name: Nathan Dean MRN: [id number] DOB: 1955-11-07 Today's Date: 05/25/2022  History of Present Illness  Nathan Dean is a 67 y.o. male with medical history significant of morbid obesity, severe peripheral neuropathy, diabetic neuropathy, type 2 diabetes, history of hydrocephalus status post shunt placement, history of renal cancer s/p treatment GERD, hyperlipidemia, essential hypertension, obstructive sleep apnea, depression with anxiety who actually walks around with a walker at home due to severe peripheral neuropathy.  Patient was using his walker today tried to go to the bathroom when he fell.      For the last 2 weeks he has been having respiratory symptoms.  found to be hypoxic in the ER. Oxygen saturation was 79% on room air.  Clinical Impression  Patient in Fowlers position in bed awake and alert upon arrival to physical therapist.  Patient able to perform all activities without oxygen cannula donned and oxygen saturation stays around 92% with lowest being at 89% following standing mini squats and marching.  Patient strength and mobility seem similar to what it was prior to hospital admission despite patient's prolonged bedrest from this hospital stay.  Patient standing balance is good as long as he has upper extremity support and is able to perform sit to stand transfers as long as he has height of bed increased.  From a physical perspective patient is safe discharge home as long as Dr. Marcelle Smiling he is medically safe to go home at this time.  Do recommend continued physical therapy in the acute setting if patient remains in the hospital for prolonged period to prevent deconditioning from prolonged hospital stay.  Patient left in room with call bell within reach and all needs met upon end of physical therapy session.    Recommendations for follow up therapy are one component of a multi-disciplinary discharge planning process, led by the attending  physician.  Recommendations may be updated based on patient status, additional functional criteria and insurance authorization.  Follow Up Recommendations Follow physician's recommendations for discharge plan and follow up therapies      Assistance Recommended at Discharge Set up Supervision/Assistance  Patient can return home with the following  A little help with walking and/or transfers    Equipment Recommendations    Recommendations for Other Services       Functional Status Assessment       Precautions / Restrictions Precautions Precautions: Fall      Mobility  Bed Mobility Overal bed mobility: Modified Independent             General bed mobility comments: used bed rails but otherwise can sit up without assistance from PT    Transfers Overall transfer level: Modified independent (Raised height of bed) Equipment used: Standard walker                    Ambulation/Gait Ambulation/Gait assistance: Min guard (stood in place marching secondary to catheter donned)   Assistive device: Standard walker       Pre-gait activities: Marching, mini squats, STS    Stairs            Wheelchair Mobility    Modified Rankin (Stroke Patients Only)       Balance Overall balance assessment: Modified Independent, History of Falls (needs AD and close CGA but able to stand and march in place without LOB or decrease in 02 sat)  Pertinent Vitals/Pain Pain Assessment Pain Assessment: No/denies pain    Home Living Family/patient expects to be discharged to:: Private residence Living Arrangements: Spouse/significant other Available Help at Discharge: Family;Available PRN/intermittently Type of Home: House Home Access: Stairs to enter Entrance Stairs-Rails: Right;Left;Can reach both Entrance Stairs-Number of Steps: 3   Home Layout: One level Home Equipment: Rollator (4 wheels);Cane -  quad;Toilet riser;Other (comment) Additional Comments: quad cane    Prior Function Prior Level of Function : Independent/Modified Independent             Mobility Comments: Ambulate with assistance with walker in room, cannot get up from low surfaces, need to raise bed to get up       Hand Dominance        Extremity/Trunk Assessment   Upper Extremity Assessment Upper Extremity Assessment: Overall WFL for tasks assessed;Defer to OT evaluation    Lower Extremity Assessment Lower Extremity Assessment: Overall WFL for tasks assessed    Cervical / Trunk Assessment Cervical / Trunk Assessment: Normal  Communication   Communication: No difficulties  Cognition Arousal/Alertness: Awake/alert Behavior During Therapy: WFL for tasks assessed/performed Overall Cognitive Status: Within Functional Limits for tasks assessed                                          General Comments      Exercises General Exercises - Lower Extremity Mini-Sqauts: 10 reps, AROM   Assessment/Plan    PT Assessment Patient needs continued PT services  PT Problem List Decreased strength;Decreased mobility;Decreased activity tolerance       PT Treatment Interventions Stair training;Functional mobility training;Therapeutic exercise;Therapeutic activities;Patient/family education    PT Goals (Current goals can be found in the Care Plan section)  Acute Rehab PT Goals Patient Stated Goal: Go home PT Goal Formulation: With patient Time For Goal Achievement: 06/08/22 Potential to Achieve Goals: Good    Frequency Min 2X/week     Co-evaluation               AM-PAC PT "6 Clicks" Mobility  Outcome Measure Help needed turning from your back to your side while in a flat bed without using bedrails?: None Help needed moving from lying on your back to sitting on the side of a flat bed without using bedrails?: A Little Help needed moving to and from a bed to a chair (including a  wheelchair)?: None Help needed standing up from a chair using your arms (e.g., wheelchair or bedside chair)?: None Help needed to walk in hospital room?: None Help needed climbing 3-5 steps with a railing? : A Little 6 Click Score: 22    End of Session Equipment Utilized During Treatment: Gait belt Activity Tolerance: Patient tolerated treatment well Patient left: in bed;with bed alarm set;with call bell/phone within reach   PT Visit Diagnosis: Repeated falls (R29.6);Muscle weakness (generalized) (M62.81);History of falling (Z91.81);Difficulty in walking, not elsewhere classified (R26.2)    Time: 5400-8676 PT Time Calculation (min) (ACUTE ONLY): 25 min   Charges:   PT Evaluation $PT Eval Low Complexity: 1 Low PT Treatments $Therapeutic Exercise: 8-22 mins        Thresa Ross PT, DPT    Norman Herrlich 05/25/2022, 4:56 PM

## 2022-05-26 DIAGNOSIS — E1165 Type 2 diabetes mellitus with hyperglycemia: Secondary | ICD-10-CM

## 2022-05-26 DIAGNOSIS — J101 Influenza due to other identified influenza virus with other respiratory manifestations: Principal | ICD-10-CM | POA: Insufficient documentation

## 2022-05-26 DIAGNOSIS — Z794 Long term (current) use of insulin: Secondary | ICD-10-CM | POA: Diagnosis not present

## 2022-05-26 DIAGNOSIS — J9601 Acute respiratory failure with hypoxia: Secondary | ICD-10-CM

## 2022-05-26 LAB — CBC
HCT: 35.8 % — ABNORMAL LOW (ref 39.0–52.0)
Hemoglobin: 11.2 g/dL — ABNORMAL LOW (ref 13.0–17.0)
MCH: 28.5 pg (ref 26.0–34.0)
MCHC: 31.3 g/dL (ref 30.0–36.0)
MCV: 91.1 fL (ref 80.0–100.0)
Platelets: 207 10*3/uL (ref 150–400)
RBC: 3.93 MIL/uL — ABNORMAL LOW (ref 4.22–5.81)
RDW: 14.6 % (ref 11.5–15.5)
WBC: 6.6 10*3/uL (ref 4.0–10.5)
nRBC: 0 % (ref 0.0–0.2)

## 2022-05-26 LAB — MAGNESIUM: Magnesium: 2 mg/dL (ref 1.7–2.4)

## 2022-05-26 LAB — BASIC METABOLIC PANEL
Anion gap: 6 (ref 5–15)
BUN: 16 mg/dL (ref 8–23)
CO2: 28 mmol/L (ref 22–32)
Calcium: 8.5 mg/dL — ABNORMAL LOW (ref 8.9–10.3)
Chloride: 102 mmol/L (ref 98–111)
Creatinine, Ser: 0.74 mg/dL (ref 0.61–1.24)
GFR, Estimated: >60 mL/min (ref >60)
Glucose, Bld: 244 mg/dL — ABNORMAL HIGH (ref 70–99)
Potassium: 3.9 mmol/L (ref 3.5–5.1)
Sodium: 136 mmol/L (ref 135–145)

## 2022-05-26 LAB — GLUCOSE, CAPILLARY: Glucose-Capillary: 233 mg/dL — ABNORMAL HIGH (ref 70–99)

## 2022-05-26 MED ORDER — OSELTAMIVIR PHOSPHATE 75 MG PO CAPS: 75.0000 mg | ORAL_CAPSULE | Freq: Two times a day (BID) | ORAL | 0 refills | Status: AC

## 2022-05-26 NOTE — Discharge Summary (Signed)
Physician Discharge Summary   Patient: Nathan Dean MRN: [id number] DOB: 1955/08/31  Admit date:     05/23/2022  Discharge date: 05/26/22  Discharge Physician: Marrion Coy   PCP: Lorrine Kin, MD   Recommendations at discharge:   PCP in 1 week. Obtain urgent renal ultrasound to rule out malignancy.  Discharge Diagnoses: Principal Problem:   Hypoxia Active Problems:   Uncontrolled type 2 diabetes mellitus with hyperglycemia, with long-term current use of insulin (HCC)   HTN (hypertension)   Hyperlipidemia   Hereditary and idiopathic peripheral neuropathy   Sleep apnea   Acute cor pulmonale (HCC)   Acute diastolic CHF (congestive heart failure) (HCC)   Chronic pain syndrome   Acute respiratory failure with hypoxia (HCC)   Influenza A   Obesity, Class III, BMI 40-49.9 (morbid obesity) (HCC)  Resolved Problems:   * No resolved hospital problems. *  Hospital Course: Nathan Dean is a 67 y.o. male with medical history significant of morbid obesity, severe peripheral neuropathy, diabetic neuropathy, type 2 diabetes, history of hydrocephalus status post shunt placement, history of renal cancer s/p treatment, essential hypertension, obstructive sleep apnea, depression with anxiety who actually walks around with a walker at home due to severe peripheral neuropathy.  Patient was using his walker tried to go to the bathroom when he fell.    For the last 2 weeks he has been having respiratory symptoms.  found to be hypoxic in the ER. Oxygen saturation was 79% on room air.  Patient was also found to have influenza A. He is treated with bronchodilators as well as Tamiflu.  Condition had improved, off oxygen today, medically stable to be discharged.  He has been evaluated by physical therapy, okay to discharge home from their standpoint.  Assessment and Plan: #1 acute hypoxic respiratory failure 2/2 Flu A Influenza A. Condition had improved, patient is off oxygen now.   Hypoxemia appears to be secondary to influenza A.  No suspicion of bacterial infection. Patient was able to ambulate with physical therapy, currently he is medically stable to be discharged.  He will be followed by PCP as outpatient, he will continue finishing Tamiflu course.   Uncontrolled type 2 diabetes with hyperglycemia. Patient was treated with scheduled insulin glargine and sliding scale insulin, doses were reduced in the hospital.  Based on current insulin use, and current glucose level, patient home regimen appears to be more appropriate.  Resume home regimen and follow-up with PCP as outpatient.   #4 essential hypertension:  --cont amlodipine, coreg, losartan   #5 severe neuropathy:  --cont Neurontin 900 mg 3 times daily. --cont Robaxin as PRN   #6 history of hydrocephalus:  Patient has a shunt in place.  Head CT showed no obvious abnormalities.   #7 history of renal cell tumor:  CT showed A 2.8 cm mass in the lateral midpole of the right kidney measures slightly above simple fluid attenuation and contains a single 2 mm calcification. This likely reflects sequela of prior injury/scar, however a malignancy is difficult to exclude. --Recommend nonemergent renal ultrasound/please follow-up with PCP as outpatient.   #8 morbid obesity, BMI 38 Meet criteria for morbid obesity due to diabetes and hypertension.  #9 depression with anxiety:  Continue home Wellbutrin and Cymbalta   #10 hyperlipidemia:  --cont statin   #11 chronic pain syndrome:  Patient goes to the pain clinic for treatment.   #12 GERD:  Continue PPIs        Consultants: None Procedures performed: None  Disposition:  Home health Diet recommendation:  Discharge Diet Orders (From admission, onward)     Start     Ordered   05/26/22 0000  Diet Carb Modified        05/26/22 1000           Carb modified diet DISCHARGE MEDICATION: Allergies as of 05/26/2022       Reactions   Penicillins  Anaphylaxis, Rash, Other (See Comments)   Succinylcholine Chloride Anaphylaxis, Rash   Keflex [cephalexin] Rash   Sulfa Antibiotics Rash, Other (See Comments)        Medication List     STOP taking these medications    doxycycline 100 MG tablet Commonly known as: VIBRA-TABS   insulin aspart 100 UNIT/ML FlexPen Commonly known as: NOVOLOG       TAKE these medications    amLODipine 10 MG tablet Commonly known as: NORVASC Take 10 mg by mouth daily.   buPROPion 300 MG 24 hr tablet Commonly known as: WELLBUTRIN XL Take 300 mg by mouth daily.   carvedilol 12.5 MG tablet Commonly known as: COREG Take 1 tablet (12.5 mg total) by mouth 2 (two) times daily with a meal.   DULoxetine 60 MG capsule Commonly known as: CYMBALTA Take 60 mg by mouth daily.   gabapentin 300 MG capsule Commonly known as: NEURONTIN Take 3 capsules (900 mg total) by mouth 3 (three) times daily.   HumaLOG 100 UNIT/ML injection Generic drug: insulin lispro Inject 18 Units into the skin in the morning and at bedtime. 18 in am. 8 in pm   insulin glargine 100 UNIT/ML injection Commonly known as: LANTUS Inject 30 Units into the skin at bedtime.   loratadine 10 MG tablet Commonly known as: CLARITIN Take 10 mg by mouth daily as needed.   losartan 100 MG tablet Commonly known as: COZAAR Take 1 tablet (100 mg total) by mouth daily.   Melatonin 10 MG Tabs Take 20 mg by mouth at bedtime as needed (sleep).   metFORMIN 500 MG tablet Commonly known as: GLUCOPHAGE Take 1,000 mg by mouth 2 (two) times daily with a meal.   methocarbamol 750 MG tablet Commonly known as: ROBAXIN Take 750 mg by mouth 4 (four) times daily.   omeprazole 20 MG capsule Commonly known as: PRILOSEC Take 20 mg by mouth daily.   oseltamivir 75 MG capsule Commonly known as: TAMIFLU Take 1 capsule (75 mg total) by mouth 2 (two) times daily for 3 days.   rosuvastatin 20 MG tablet Commonly known as: CRESTOR Take 20 mg by  mouth daily.   senna-docusate 8.6-50 MG tablet Commonly known as: Senokot-S Take 2 tablets by mouth daily.        Follow-up Information     Lorrine Kin, MD Follow up in 1 week(s).   Specialty: Family Medicine Contact information: [address] [facility] [address] [place] [address] [phone]                Discharge Exam: Ceasar Mons Weights   05/23/22 1713 05/24/22 2049  Weight: (!) 161 kg (!) 159 kg   General exam: Appears calm and comfortable  Respiratory system: Clear to auscultation. Respiratory effort normal. Cardiovascular system: S1 & S2 heard, RRR. No JVD, murmurs, rubs, gallops or clicks. No pedal edema. Gastrointestinal system: Abdomen is nondistended, soft and nontender. No organomegaly or masses felt. Normal bowel sounds heard. Central nervous system: Alert and oriented. No focal neurological deficits. Extremities: Symmetric 5 x 5 power. Skin: No rashes, lesions or ulcers Psychiatry:  Judgement and insight appear normal. Mood & affect appropriate.    Condition at discharge: good  The results of significant diagnostics from this hospitalization (including imaging, microbiology, ancillary and laboratory) are listed below for reference.   Imaging Studies: CT Head Wo Contrast  Result Date: 05/23/2022 CLINICAL DATA:  Mental status change, unknown cause EXAM: CT HEAD WITHOUT CONTRAST TECHNIQUE: Contiguous axial images were obtained from the base of the skull through the vertex without intravenous contrast. RADIATION DOSE REDUCTION: This exam was performed according to the departmental dose-optimization program which includes automated exposure control, adjustment of the mA and/or kV according to patient size and/or use of iterative reconstruction technique. COMPARISON:  CT head 09/18/2021 FINDINGS: Brain: No evidence of acute infarction, hemorrhage, mass, mass effect, or midline shift. No hydrocephalus or extra-axial fluid collection. Right frontal  approach ventriculostomy catheter with tip approximating the foramen of Monro, with overall unchanged size and configuration of the ventricles. Periventricular white matter changes, likely the sequela of chronic small vessel ischemic disease. Vascular: Contrast is noted within the vasculature from CT chest abdomen pelvis completed earlier today. Left frontal developmental venous anomaly. Skull: Right frontal burr hole. Negative for fracture or focal lesion. Sinuses/Orbits: No acute finding. Status post bilateral lens replacements. Other: The mastoid air cells are well aerated. IMPRESSION: No acute intracranial process. Unchanged position of ventriculostomy catheter with unchanged chronic ventriculomegaly. Electronically Signed   By: Alison  Vasan M.D.   On: 05/23/2022 21:39   CT Angio Chest PE W and/or Wo Contrast  Result Date: 05/23/2022 CLINICAL DATA:  Weakness for 2 weeks, fall from standing. EXAM: CT ANGIOGRAPHY CHEST CT ABDOMEN AND PELVIS WITH CONTRAST TECHNIQUE: Multidetector CT imaging of the chest was performed using the standard protocol during bolus administration of intravenous contrast. Multiplanar CT image reconstructions and MIPs were obtained to evaluate the vascular anatomy. Multidetector CT imaging of the abdomen and pelvis was performed using the standard protocol during bolus administration of intravenous contrast. RADIATION DOSE REDUCTION: This exam was performed according to the departmental dose-optimization program which includes automated exposure control, adjustment of the mA and/or kV according to patient size and/or use of iterative reconstruction technique. CONTRAST:  <MEASUREME9[facility]1828LMoz41<MEASUREMENT9[facility]9856LMoz41/66/<MEASUREMENT7390 G[facility] Incre25LMoz4<MEASUREMENT51[facility]9 37LMoz41<MEASUREMENT947[facility]1 35LMoz4<MEASUREMENT6[facility]4 52LMoz41/66/[place] [facility]<MEASUREMENT7[facility]044LMoz41<MEASUREMENT2 SE. BiEll[facility] Health Centerrc64LMoz4<MEASUREMENT946[facility]0 2LMoz41<MEASUREMENT516 [facility]. [facility] LLCWa82LMoz41/6<MEASUREMENT8434 [facility] HospitalW.76LMoz41/66/I<MEASUREMENT80[facility]5528LMoz41/<MEASUREMENT958[facility] H59LMo<MEASUREMENT329 [facility] HospitalFa46LMoz41/66<MEASUREMENT775[facility]3 50LMoz<MEASUREMENT995 [facility] LLCw 9LMoz41/<MEASUREMENT6 BeaveDavenport Ambulatory Surgery Center LLCr 25LM<MEASUREMENT5[facility] B4LMoz4<MEASUREMENT704[facility] W12LMoz41/66/Che<MEASUREMENTNaperville Surgical Centre6075LMo<MEASUREMESunrise Flamingo Surgery Center Limited PartnershipNT49LMoz41/<MEASUREMENT4 RandaCarolinas Phys[facility] D[facility] Plazall85LMoz41/66/G<MEASUREMENT806 [facility] 56LMo<MEASUREMENT9682[facility] W104LMoz41/66/GettyLemont Fillerstures editor SOLN COMPARISON:  Same day chest radiograph and CT chest dated 08/11/2019 FINDINGS: CTA CHEST FINDINGS Cardiovascular: Satisfactory opacification of the pulmonary arteries to the segmental level. No evidence of pulmonary embolism. The main pulmonary artery is enlarged, measuring 4.0 cm in  diameter, suggestive of pulmonary hypertension. Vascular calcifications are seen in the coronary arteries and aortic arch. Normal heart size. No pericardial effusion. Mediastinum/Nodes: No enlarged mediastinal, hilar, or axillary lymph nodes. Thyroid gland, trachea, and esophagus demonstrate no significant findings. Lungs/Pleura: There is bronchial wall thickening bilaterally. Mild bilateral dependent atelectasis is noted. Mild bilateral ground-glass opacities may reflect expiratory mosaic attenuation. No pleural effusion or pneumothorax. Musculoskeletal: Degenerative changes are seen in the spine. Review of the MIP images confirms the above findings. CT ABDOMEN and PELVIS FINDINGS Hepatobiliary: No focal liver abnormality is seen. No gallstones, gallbladder wall thickening, or biliary dilatation. Pancreas: Unremarkable. No pancreatic ductal dilatation or surrounding inflammatory changes. Spleen: Normal in size without focal abnormality. Adrenals/Urinary Tract: Adrenal glands are unremarkable. There is a defect in the normal renal cortex in the lateral midpole of the right kidney with a 2.8 cm mass which measures slightly above simple fluid attenuation and contains a single 2 mm calcification. There is suggestion that there are cystic spaces separated by thin septations within this area. A 3 mm  calculus is seen in the distal left ureter near the ureterovesical junction. There are multiple nonobstructive calculi on the right which measure up to 2 mm. No renal calculi are seen on the left. No focal lesion on the left. There is no significant hydroureteronephrosis on either side. Bladder is unremarkable. Stomach/Bowel: Stomach is within normal limits. Appendix appears normal. No evidence of bowel wall thickening, distention, or inflammatory changes. Vascular/Lymphatic: Aortic atherosclerosis. No enlarged abdominal or pelvic lymph nodes. Reproductive: Prostate is unremarkable. Other: A ventriculoperitoneal shunt  catheter enters the abdomen on the right and terminates in the right pericolic gutter. There is a small amount of free fluid in this area without a well-defined collection. No abdominal wall hernia. Musculoskeletal: Moderate-to-severe degenerative changes are seen in the spine. Review of the MIP images confirms the above findings. IMPRESSION: 1. No evidence of pulmonary embolism. 2. Bilateral bronchial wall thickening bilaterally is nonspecific but may reflect an infectious/inflammatory process or chronic aspiration. 3. A 3 mm calculus in the distal left ureter near the ureterovesical junction without significant hydroureteronephrosis. 4. A 2.8 cm mass in the lateral midpole of the right kidney measures slightly above simple fluid attenuation and contains a single 2 mm calcification. This likely reflects sequela of prior injury/scar, however a malignancy is difficult to exclude. Recommend nonemergent renal ultrasound as the next step in evaluation. Aortic Atherosclerosis (ICD10-I70.0). Electronically Signed   By: Romona Curls M.D.   On: 05/23/2022 20:41   CT ABDOMEN PELVIS W CONTRAST  Result Date: 05/23/2022 CLINICAL DATA:  Weakness for 2 weeks, fall from standing. EXAM: CT ANGIOGRAPHY CHEST CT ABDOMEN AND PELVIS WITH CONTRAST TECHNIQUE: Multidetector CT imaging of the chest was performed using the standard protocol during bolus administration of intravenous contrast. Multiplanar CT image reconstructions and MIPs were obtained to evaluate the vascular anatomy. Multidetector CT imaging of the abdomen and pelvis was performed using the standard protocol during bolus administration of intravenous contrast. RADIATION DOSE REDUCTION: This exam was performed according to the departmental dose-optimization program which includes automated exposure control, adjustment of the mA and/or kV according to patient size and/or use of iterative reconstruction technique. CONTRAST:  OMNIPAQUE IOHEXOL 350 MG/ML SOLN  COMPARISON:  Same day chest radiograph and CT chest dated 08/11/2019 FINDINGS: CTA CHEST FINDINGS Cardiovascular: Satisfactory opacification of the pulmonary arteries to the segmental level. No evidence of pulmonary embolism. The main pulmonary artery is enlarged, measuring 4.0 cm in diameter, suggestive of pulmonary hypertension. Vascular calcifications are seen in the coronary arteries and aortic arch. Normal heart size. No pericardial effusion. Mediastinum/Nodes: No enlarged mediastinal, hilar, or axillary lymph nodes. Thyroid gland, trachea, and esophagus demonstrate no significant findings. Lungs/Pleura: There is bronchial wall thickening bilaterally. Mild bilateral dependent atelectasis is noted. Mild bilateral ground-glass opacities may reflect expiratory mosaic attenuation. No pleural effusion or pneumothorax. Musculoskeletal: Degenerative changes are seen in the spine. Review of the MIP images confirms the above findings. CT ABDOMEN and PELVIS FINDINGS Hepatobiliary: No focal liver abnormality is seen. No gallstones, gallbladder wall thickening, or biliary dilatation. Pancreas: Unremarkable. No pancreatic ductal dilatation or surrounding inflammatory changes. Spleen: Normal in size without focal abnormality. Adrenals/Urinary Tract: Adrenal glands are unremarkable. There is a defect in the normal renal cortex in the lateral midpole of the right kidney with a 2.8 cm mass which measures slightly above simple fluid attenuation and contains a single 2 mm calcification. There is suggestion that there are cystic spaces separated by thin septations within this area. A 3 mm calculus is seen in  the distal left ureter near the ureterovesical junction. There are multiple nonobstructive calculi on the right which measure up to 2 mm. No renal calculi are seen on the left. No focal lesion on the left. There is no significant hydroureteronephrosis on either side. Bladder is unremarkable. Stomach/Bowel: Stomach is within  normal limits. Appendix appears normal. No evidence of bowel wall thickening, distention, or inflammatory changes. Vascular/Lymphatic: Aortic atherosclerosis. No enlarged abdominal or pelvic lymph nodes. Reproductive: Prostate is unremarkable. Other: A ventriculoperitoneal shunt catheter enters the abdomen on the right and terminates in the right pericolic gutter. There is a small amount of free fluid in this area without a well-defined collection. No abdominal wall hernia. Musculoskeletal: Moderate-to-severe degenerative changes are seen in the spine. Review of the MIP images confirms the above findings. IMPRESSION: 1. No evidence of pulmonary embolism. 2. Bilateral bronchial wall thickening bilaterally is nonspecific but may reflect an infectious/inflammatory process or chronic aspiration. 3. A 3 mm calculus in the distal left ureter near the ureterovesical junction without significant hydroureteronephrosis. 4. A 2.8 cm mass in the lateral midpole of the right kidney measures slightly above simple fluid attenuation and contains a single 2 mm calcification. This likely reflects sequela of prior injury/scar, however a malignancy is difficult to exclude. Recommend nonemergent renal ultrasound as the next step in evaluation. Aortic Atherosclerosis (ICD10-I70.0). Electronically Signed   By: Zerita Boers M.D.   On: 05/23/2022 20:41   DG Chest 2 View  Result Date: 05/23/2022 CLINICAL DATA:  Cough, fall EXAM: CHEST - 2 VIEW COMPARISON:  09/18/2021 FINDINGS: Cardiac size is within normal limits. There is poor inspiration. There are no signs of pulmonary edema or focal pulmonary consolidation. There is no pleural effusion or pneumothorax. Ventriculoperitoneal shunt is noted on the right side. IMPRESSION: No active cardiopulmonary disease. Electronically Signed   By: Elmer Picker M.D.   On: 05/23/2022 17:44   DG PAIN CLINIC C-ARM 1-60 MIN NO REPORT  Result Date: 05/12/2022 Fluoro was used, but no Radiologist  interpretation will be provided. Please refer to "NOTES" tab for provider progress note.   Microbiology: Results for orders placed or performed during the hospital encounter of 05/23/22  Resp panel by RT-PCR (RSV, Flu A&B, Covid) Anterior Nasal Swab     Status: None   Collection Time: 05/23/22  5:16 PM   Specimen: Anterior Nasal Swab  Result Value Ref Range Status   SARS Coronavirus 2 by RT PCR NEGATIVE NEGATIVE Final    Comment: (NOTE) SARS-CoV-2 target nucleic acids are NOT DETECTED.  The SARS-CoV-2 RNA is generally detectable in upper respiratory specimens during the acute phase of infection. The lowest concentration of SARS-CoV-2 viral copies this assay can detect is 138 copies/mL. A negative result does not preclude SARS-Cov-2 infection and should not be used as the sole basis for treatment or other patient management decisions. A negative result may occur with  improper specimen collection/handling, submission of specimen other than nasopharyngeal swab, presence of viral mutation(s) within the areas targeted by this assay, and inadequate number of viral copies(<138 copies/mL). A negative result must be combined with clinical observations, patient history, and epidemiological information. The expected result is Negative.  Fact Sheet for Patients:  EntrepreneurPulse.com.au  Fact Sheet for Healthcare Providers:  IncredibleEmployment.be  This test is no t yet approved or cleared by the [place] FDA and  has been authorized for detection and/or diagnosis of SARS-CoV-2 by FDA under an Emergency Use Authorization (EUA). This EUA will remain  in effect (meaning this  test can be used) for the duration of the COVID-19 declaration under Section 564(b)(1) of the Act, 21 U.S.C.section 360bbb-3(b)(1), unless the authorization is terminated  or revoked sooner.       Influenza A by PCR NEGATIVE NEGATIVE Final   Influenza B by PCR NEGATIVE  NEGATIVE Final    Comment: (NOTE) The Xpert Xpress SARS-CoV-2/FLU/RSV plus assay is intended as an aid in the diagnosis of influenza from Nasopharyngeal swab specimens and should not be used as a sole basis for treatment. Nasal washings and aspirates are unacceptable for Xpert Xpress SARS-CoV-2/FLU/RSV testing.  Fact Sheet for Patients: BloggerCourse.com  Fact Sheet for Healthcare Providers: SeriousBroker.it  This test is not yet approved or cleared by the [place] FDA and has been authorized for detection and/or diagnosis of SARS-CoV-2 by FDA under an Emergency Use Authorization (EUA). This EUA will remain in effect (meaning this test can be used) for the duration of the COVID-19 declaration under Section 564(b)(1) of the Act, 21 U.S.C. section 360bbb-3(b)(1), unless the authorization is terminated or revoked.     Resp Syncytial Virus by PCR NEGATIVE NEGATIVE Final    Comment: (NOTE) Fact Sheet for Patients: BloggerCourse.com  Fact Sheet for Healthcare Providers: SeriousBroker.it  This test is not yet approved or cleared by the [place] FDA and has been authorized for detection and/or diagnosis of SARS-CoV-2 by FDA under an Emergency Use Authorization (EUA). This EUA will remain in effect (meaning this test can be used) for the duration of the COVID-19 declaration under Section 564(b)(1) of the Act, 21 U.S.C. section 360bbb-3(b)(1), unless the authorization is terminated or revoked.  Performed at [facility], [address] [place], [place] [address]   Culture, blood (Routine X 2) w Reflex to ID Panel     Status: None (Preliminary result)   Collection Time: 05/23/22 11:30 PM   Specimen: BLOOD  Result Value Ref Range Status   Specimen Description BLOOD BLOOD LEFT FOREARM  Final   Special Requests   Final    BOTTLES DRAWN AEROBIC AND ANAEROBIC Blood  Culture adequate volume   Culture   Final    NO GROWTH 2 DAYS Performed at [facility], [address] [place], [place] [address]    Report Status PENDING  Incomplete  Respiratory (~20 pathogens) panel by PCR     Status: Abnormal   Collection Time: 05/24/22 10:24 AM   Specimen: Nasopharyngeal Swab; Respiratory  Result Value Ref Range Status   Adenovirus NOT DETECTED NOT DETECTED Final   Coronavirus 229E NOT DETECTED NOT DETECTED Final    Comment: (NOTE) The Coronavirus on the Respiratory Panel, DOES NOT test for the novel  Coronavirus (2019 nCoV)    Coronavirus HKU1 NOT DETECTED NOT DETECTED Final   Coronavirus NL63 NOT DETECTED NOT DETECTED Final   Coronavirus OC43 NOT DETECTED NOT DETECTED Final   Metapneumovirus NOT DETECTED NOT DETECTED Final   Rhinovirus / Enterovirus NOT DETECTED NOT DETECTED Final   Influenza A DETECTED (A) NOT DETECTED Final    Comment: Referred to [facility] in [place], [place] for serotyping.   Influenza B NOT DETECTED NOT DETECTED Final   Parainfluenza Virus 1 NOT DETECTED NOT DETECTED Final   Parainfluenza Virus 2 NOT DETECTED NOT DETECTED Final   Parainfluenza Virus 3 NOT DETECTED NOT DETECTED Final   Parainfluenza Virus 4 NOT DETECTED NOT DETECTED Final   Respiratory Syncytial Virus NOT DETECTED NOT DETECTED Final   Bordetella pertussis NOT DETECTED NOT DETECTED Final   Bordetella  Parapertussis NOT DETECTED NOT DETECTED Final   Chlamydophila pneumoniae NOT DETECTED NOT DETECTED Final   Mycoplasma pneumoniae NOT DETECTED NOT DETECTED Final    Comment: Performed at [facility], [place] [address] [place], [place] [address]    Labs: CBC: Recent Labs  Lab 05/23/22 1715 05/24/22 0332 05/25/22 0542 05/26/22 0519  WBC 11.3* 9.6 8.0 6.6  HGB 12.4* 11.5* 10.8* 11.2*  HCT 40.1 37.2* 34.9* 35.8*  MCV 92.8 91.6 91.1 91.1  PLT 218 181 191 706   Basic Metabolic Panel: Recent Labs  Lab 05/23/22 1818 05/23/22 2357 05/24/22 0345  05/25/22 0542 05/26/22 0519  NA 134* 134* 136 135 136  K 3.9 3.6 3.6 3.5 3.9  CL 99 100 101 100 102  CO2 24 25 25 27 28   GLUCOSE 156* 197* 195* 208* 244*  BUN 12 11 12 14 16   CREATININE 0.92 0.80 0.77 0.79 0.74  CALCIUM 8.6* 8.3* 8.3* 8.1* 8.5*  MG  --   --   --  2.3 2.0   Liver Function Tests: Recent Labs  Lab 05/23/22 2357 05/24/22 0345  AST 25 21  ALT 15 13  ALKPHOS 57 53  BILITOT 1.1 1.0  PROT 6.7 6.4*  ALBUMIN 3.3* 3.1*   CBG: Recent Labs  Lab 05/25/22 0738 05/25/22 1127 05/25/22 1657 05/25/22 2046 05/26/22 0756  GLUCAP 208* 234* 206* 198* 233*    Discharge time spent: greater than 30 minutes.  Signed: Sharen Hones, MD [facility] 05/26/2022

## 2022-05-26 NOTE — TOC Transition Note (Addendum)
Transition of Care [facility]) - CM/SW Discharge Note   Patient Details  Name: Nathan Dean MRN: [id number] Date of Birth: 06-Aug-1955  [facility]) CM/SW Contact:  Gerilyn Pilgrim, LCSW Phone Number: 05/26/2022, 10:44 AM   Clinical Narrative:  MD sent message to [facility] that pt needs HH PT. Orders are in for discharge. Pt will discharge with [facility] home health for PT. Wife notified. CSW signing off.            Patient Goals and CMS Choice      Discharge Placement                         Discharge Plan and Services Additional resources added to the After Visit Summary for                                       Social Determinants of Health (SDOH) Interventions SDOH Screenings   Food Insecurity: No Food Insecurity (05/24/2022)  Housing: Low Risk  (05/24/2022)  Transportation Needs: No Transportation Needs (05/24/2022)  Utilities: Not At Risk (05/24/2022)  Depression (PHQ2-9): Low Risk  (05/12/2022)  Tobacco Use: Medium Risk (05/24/2022)     Readmission Risk Interventions     No data to display

## 2022-05-26 NOTE — Care Management Important Message (Signed)
Important Message  Patient Details  Name: Nathan Dean MRN: [id number] Date of Birth: 01-19-1956   Medicare Important Message Given:  N/A - LOS <3 / Initial given by admissions     Juliann Pulse A Arthur Speagle 05/26/2022, 7:57 AM

## 2022-05-29 LAB — CULTURE, BLOOD (ROUTINE X 2)
Culture: NO GROWTH
Special Requests: ADEQUATE

## 2022-06-04 ENCOUNTER — Encounter: Payer: Self-pay | Admitting: Student in an Organized Health Care Education/Training Program

## 2022-06-09 ENCOUNTER — Ambulatory Visit: Payer: Medicare Other | Admitting: Student in an Organized Health Care Education/Training Program

## 2022-06-19 HISTORY — PX: SKIN GRAFT: SHX250

## 2022-06-28 ENCOUNTER — Encounter: Payer: Self-pay | Admitting: Cardiovascular Disease

## 2022-06-28 NOTE — Telephone Encounter (Signed)
error 

## 2022-07-20 ENCOUNTER — Encounter: Payer: Self-pay | Admitting: Podiatry

## 2022-07-20 ENCOUNTER — Ambulatory Visit: Payer: Medicare HMO | Admitting: Podiatry

## 2022-07-20 ENCOUNTER — Ambulatory Visit (INDEPENDENT_AMBULATORY_CARE_PROVIDER_SITE_OTHER): Payer: Medicare HMO

## 2022-07-20 DIAGNOSIS — M7751 Other enthesopathy of right foot: Secondary | ICD-10-CM

## 2022-07-20 DIAGNOSIS — M21171 Varus deformity, not elsewhere classified, right ankle: Secondary | ICD-10-CM

## 2022-07-20 DIAGNOSIS — M779 Enthesopathy, unspecified: Secondary | ICD-10-CM

## 2022-07-20 DIAGNOSIS — M21371 Foot drop, right foot: Secondary | ICD-10-CM | POA: Diagnosis not present

## 2022-07-20 DIAGNOSIS — M7750 Other enthesopathy of unspecified foot: Secondary | ICD-10-CM

## 2022-07-20 MED ORDER — BETAMETHASONE SOD PHOS & ACET 6 (3-3) MG/ML IJ SUSP
3.0000 mg | Freq: Once | INTRAMUSCULAR | Status: AC
  Administered 2022-07-20: 3 mg via INTRA_ARTICULAR

## 2022-07-20 NOTE — Progress Notes (Signed)
Chief Complaint  Patient presents with   Foot Pain    "I'm having discomfort on the bottom of my pinkie toe and the one next to it.  My foot rolls.  My ankle rolls too."    Subjective:  67 y.o. male with PMHx of diabetes mellitus presenting today for follow-up evaluation of recurrent ankle sprains to the right foot.  Patient wears an AFO right lower extremity.  Patient states that he has always had a history of ankle sprains even as a teenager however the foot drop and varus deformity only became severe about 2-3 years ago   Patient also has been adding shoes and insoles.  Most recently the patient has been complaining of pain and tenderness associated to the lateral forefoot.  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    Diabetes ([place])    GERD (gastroesophageal reflux disease)    Hyperlipidemia    Hypertension    Obesity    Sleep apnea    Past Surgical History:  Procedure Laterality Date   BACK SURGERY     in the 80's -herniated disc   BACK SURGERY     L4 bone spur 1990   CARPAL TUNNEL RELEASE Right 12/28/2016   Procedure: RIGHT ULNAR AND MEDIAN NEUROPLASTY AT WRIST;  Surgeon: Milly Jakob, MD;  Location: [facility];  Service: Orthopedics;  Laterality: Right;   CARPAL TUNNEL RELEASE Left    25 years ago   CATARACT EXTRACTION W/ INTRAOCULAR LENS IMPLANT Bilateral    REPLACEMENT TOTAL KNEE BILATERAL  2014   left 2012 rt 2014   SHOULDER OPEN ROTATOR CUFF REPAIR Right 08/07/2019   Procedure: ROTATOR CUFF REPAIR SHOULDER OPEN;  Surgeon: Thornton Park, MD;  Location: [facility];  Service: Orthopedics;  Laterality: Right;   TONSILLECTOMY     Allergies  Allergen Reactions   Penicillins Anaphylaxis, Rash and Other (See Comments)   Succinylcholine Chloride Anaphylaxis and Rash   Keflex [Cephalexin] Rash   Sulfa Antibiotics Rash and Other (See  Comments)    Objective/Physical Exam General: The patient is alert and oriented x3 in no acute distress.  Dermatology:  No open wounds noted.  Skin is warm to touch.  Vascular: Palpable pedal pulses bilaterally. No edema or erythema noted. Capillary refill within normal limits.  Neurological: Epicritic and protective threshold diminished bilaterally.   Musculoskeletal Exam: Reducible varus foot deformity noted.  Loss of muscle strength with dorsiflexion and eversion of the foot.  Overcompensation of the anterior and posterior tibial tendons. There is also pain on palpation to the fourth and fifth MTP of the right foot secondary to lateral column loading during ambulation  Assessment: 1. Chronic ankle sprains RLE 2. Foot Drop RLE 3.  Reducible varus foot right 4.  Fourth and fifth MTP capsulitis right foot secondary to lateral column loading   Plan of Care:  1. Patient was evaluated.  Injection of 0.5 cc Celestone Soluspan injected around the fourth and fifth MTP of the right foot for temporary relief 2.  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  The foot is in a varus type position due to overcompensation from the medial ankle tendons. 3.   Continue custom molded diabetic insoles and shoes with AFO brace 4.  Order placed for NCV RLE 5.  Order placed for MRI RT foot and ankle  wo contrast 6.  Conversation with the patient regarding the loss of integrity of the peroneal tendons.  His options are either conservative management with bracing and insoles or surgery to create a plantigrade foot offering stability and potentially reducing pain.  Currently his range of motion is reducible and he would benefit from tendon transfer and calcaneal osteotomy to correct for the rear foot varus.  Currently he is not in the position, according to his spouse who is present today, to be nonweightbearing for 8 weeks  postoperatively. 7.  Will plan to discuss results of the NCV and MRIs after they are completed via telephone    Edrick Kins, DPM [facility]  Dr. Edrick Kins, DPM    [address], [place] [address]                Office [phone]  Fax [phone]

## 2022-08-03 ENCOUNTER — Encounter: Payer: Self-pay | Admitting: Podiatry

## 2022-08-05 ENCOUNTER — Other Ambulatory Visit: Payer: Self-pay | Admitting: *Deleted

## 2022-08-11 ENCOUNTER — Ambulatory Visit
Admission: RE | Admit: 2022-08-11 | Discharge: 2022-08-11 | Disposition: A | Discharge status: Home / Self Care | Payer: Medicare HMO | Source: Ambulatory Visit | Attending: Podiatry | Admitting: Podiatry

## 2022-08-11 DIAGNOSIS — M21171 Varus deformity, not elsewhere classified, right ankle: Secondary | ICD-10-CM

## 2022-08-12 ENCOUNTER — Ambulatory Visit: Payer: Medicare HMO | Admitting: Cardiovascular Disease

## 2022-08-31 ENCOUNTER — Ambulatory Visit
Payer: Medicare HMO | Attending: Student in an Organized Health Care Education/Training Program | Admitting: Student in an Organized Health Care Education/Training Program

## 2022-08-31 ENCOUNTER — Other Ambulatory Visit: Payer: Self-pay | Admitting: Student

## 2022-08-31 ENCOUNTER — Encounter: Payer: Self-pay | Admitting: Student in an Organized Health Care Education/Training Program

## 2022-08-31 ENCOUNTER — Telehealth: Payer: Self-pay

## 2022-08-31 VITALS — BP 151/89 | HR 84 | Temp 96.8°F | Ht >= 80 in | Wt 344.0 lb

## 2022-08-31 DIAGNOSIS — G894 Chronic pain syndrome: Secondary | ICD-10-CM

## 2022-08-31 DIAGNOSIS — M75101 Unspecified rotator cuff tear or rupture of right shoulder, not specified as traumatic: Secondary | ICD-10-CM | POA: Diagnosis present

## 2022-08-31 DIAGNOSIS — G5682 Other specified mononeuropathies of left upper limb: Secondary | ICD-10-CM

## 2022-08-31 DIAGNOSIS — G8929 Other chronic pain: Secondary | ICD-10-CM | POA: Insufficient documentation

## 2022-08-31 DIAGNOSIS — M12811 Other specific arthropathies, not elsewhere classified, right shoulder: Secondary | ICD-10-CM | POA: Insufficient documentation

## 2022-08-31 DIAGNOSIS — Z9889 Other specified postprocedural states: Secondary | ICD-10-CM | POA: Diagnosis present

## 2022-08-31 DIAGNOSIS — M25512 Pain in left shoulder: Secondary | ICD-10-CM | POA: Insufficient documentation

## 2022-08-31 DIAGNOSIS — M25511 Pain in right shoulder: Secondary | ICD-10-CM | POA: Insufficient documentation

## 2022-08-31 DIAGNOSIS — M542 Cervicalgia: Secondary | ICD-10-CM

## 2022-08-31 NOTE — Progress Notes (Signed)
Safety precautions to be maintained throughout the outpatient stay will include: orient to surroundings, keep bed in low position, maintain call bell within reach at all times, provide assistance with transfer out of bed and ambulation.  

## 2022-08-31 NOTE — Progress Notes (Signed)
PROVIDER NOTE: Information contained herein reflects review and annotations entered in association with encounter. Interpretation of such information and data should be left to medically-trained personnel. Information provided to patient can be located elsewhere in the medical record under "Patient Instructions". Document created using STT-dictation technology, any transcriptional errors that may result from process are unintentional.    Patient: Nathan Dean  Service Category: E/M  Provider: Edward Jolly, MD  DOB: 02/24/1956  DOS: 08/31/2022  Referring Provider: Lorrine Kin  MRN: [id number]  Specialty: Interventional Pain Management  PCP: Lorrine Kin, MD  Type: Established Patient  Setting: Ambulatory outpatient    Location: Office  Delivery: Face-to-face     HPI  [name], a 67 y.o. 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 [name] primary complain today is Arm Pain (Bicep pain bilaterally) Last encounter: My last encounter with him was on 05/12/22 Pertinent problems: [name] has Chronic pain syndrome; History of bilateral knee replacement; Bilateral chronic knee pain; Post laminectomy syndrome; Localized pain of left shoulder joint; and Disorder of left suprascapular nerve on their pertinent problem list. Pain Assessment: Severity of Chronic pain is reported as a 8 /10. Location: Arm Right, Left (bicep)/radiates to right shoulder. Onset: More than a month ago. Quality: Constant, Stabbing, Throbbing. Timing: Constant. Modifying factor(s): denies. Vitals:  height is 6\' 9"  (2.057 m) and weight is 344 lb (156 kg) (abnormal). His temporal temperature is 96.8 F (36 C) (abnormal). His blood pressure is 151/89 (abnormal) and his pulse is 84. His oxygen saturation is 98%.   Reason for encounter: patient-requested evaluation.    Nathan Dean continues to have persistent and severe bilateral shoulder pain related to  shoulder osteoarthritis, axillary nerve irritation and suprascapular nerve irritation as well. He has severe pain and limited shoulder abduction. We discussed repeating a diagnostic suprascapular and axillary nerve block as the suprascapular nerve courses through the sphenoid glenoid notch and the infraglenoid tuberosity. Future considerations also include axillary and suprascapular peripheral nerve stimulation with Sprint.  I discussed this with him at length and he would like to consider the diagnostic nerve block before further discussing the stimulation procedure.    ROS  Constitutional: Denies any fever or chills Gastrointestinal: No reported hemesis, hematochezia, vomiting, or acute GI distress Musculoskeletal:  Severe right and left shoulder pain and limited range of motion Neurological:  Paresthesias of left greater than right shoulder  Medication Review  Melatonin, albuterol, amLODipine, aspirin EC, buPROPion, carvedilol, donepezil, gabapentin, insulin glargine-yfgn, insulin lispro, loratadine, losartan, metFORMIN, methocarbamol, mometasone, omeprazole, ondansetron, rosuvastatin, senna-docusate, and traZODone  History Review  Allergy: [name] is allergic to penicillins, succinylcholine chloride, keflex [cephalexin], and sulfa antibiotics. Drug: [name]  reports no history of drug use. Alcohol:  reports current alcohol use. Tobacco:  reports that he quit smoking about 26 years ago. His smoking use included cigarettes. He has a 40.00 pack-year smoking history. He has never used smokeless tobacco. Social: [name]  reports that he quit smoking about 26 years ago. His smoking use included cigarettes. He has a 40.00 pack-year smoking history. He has never used smokeless tobacco. He reports current alcohol use. He reports that he does not use drugs. Medical:  has a past medical history of Complication of anesthesia, Diabetes, GERD (gastroesophageal reflux disease), Hyperlipidemia,  Hypertension, Obesity, and Sleep apnea. Surgical: [name]  has a past surgical history that includes Replacement total knee bilateral (2014); Carpal tunnel release (Right, 12/28/2016); Carpal tunnel  release (Left); Tonsillectomy; Cataract extraction w/ intraocular lens implant (Bilateral); Back surgery; Back surgery; Shoulder open rotator cuff repair (Right, 08/07/2019); and Skin graft (Left, 06/19/2022). Family: family history includes Cancer in his brother; Kidney failure in his father; Ovarian cancer in his mother.  Laboratory Chemistry Profile   Renal Lab Results  Component Value Date   BUN 16 05/26/2022   CREATININE 0.74 05/26/2022   BCR 28 (H) 07/20/2017   GFRAA >60 01/18/2020   GFRNONAA >60 05/26/2022    Hepatic Lab Results  Component Value Date   AST 21 05/24/2022   ALT 13 05/24/2022   ALBUMIN 3.1 (L) 05/24/2022   ALKPHOS 53 05/24/2022    Electrolytes Lab Results  Component Value Date   NA 136 05/26/2022   K 3.9 05/26/2022   CL 102 05/26/2022   CALCIUM 8.5 (L) 05/26/2022   MG 2.0 05/26/2022    Bone No results found for: "VD25OH", "VD125OH2TOT", "NG2952WU1", "LK4401UU7", "25OHVITD1", "25OHVITD2", "25OHVITD3", "TESTOFREE", "TESTOSTERONE"  Inflammation (CRP: Acute Phase) (ESR: Chronic Phase) Lab Results  Component Value Date   ESRSEDRATE 77 (H) 08/16/2019   LATICACIDVEN 1.6 08/11/2019         Note: Above Lab results reviewed.   Physical Exam  General appearance: Well nourished, well developed, and well hydrated. In no apparent acute distress Mental status: Alert, oriented x 3 (person, place, & time)       Respiratory: No evidence of acute respiratory distress Eyes: PERLA Vitals: BP (!) 151/89 (BP Location: Left Arm, Patient Position: Sitting, Cuff Size: Large)   Pulse 84   Temp (!) 96.8 F (36 C) (Temporal)   Ht  (2.057 m)   Wt (!) 344 lb (156 kg)   SpO2 98%   BMI 36.86 kg/m  BMI: Estimated body mass index is 36.86 kg/m as calculated from the  following:   Height as of this encounter:  (2.057 m).   Weight as of this encounter: 344 lb (156 kg). Ideal: Ideal body weight: 98.3 kg (216 lb 11.4 oz) Adjusted ideal body weight: 121.4 kg (267 lb 10 oz)  Upper Extremity (UE) Exam    Side: Right upper extremity  Side: Left upper extremity  Skin & Extremity Inspection: Skin color, temperature, and hair growth are WNL. No peripheral edema or cyanosis. No masses, redness, swelling, asymmetry, or associated skin lesions. No contractures.  Skin & Extremity Inspection: Skin color, temperature, and hair growth are WNL. No peripheral edema or cyanosis. No masses, redness, swelling, asymmetry, or associated skin lesions. No contractures.  Functional ROM: Unrestricted ROM          Functional ROM: Pain restricted ROM for shoulder and elbow  Muscle Tone/Strength: Functionally intact. No obvious neuro-muscular anomalies detected.  Muscle Tone/Strength: Functionally intact. No obvious neuro-muscular anomalies detected.  Sensory (Neurological): Unimpaired          Sensory (Neurological): Neurogenic pain pattern and musculoskeletal          Palpation: No palpable anomalies              Palpation: No palpable anomalies              Provocative Test(s):  Phalen's test: deferred Tinel's test: deferred Apley's scratch test (touch opposite shoulder):  Action 1 (Across chest): deferred Action 2 (Overhead): deferred Action 3 (LB reach): deferred   Provocative Test(s):  Phalen's test: deferred Tinel's test: deferred Apley's scratch test (touch opposite shoulder):  Action 1 (Across chest): Decreased ROM Action 2 (Overhead): Decreased ROM Action 3 (LB  reach): Decreased ROM     Assessment   Diagnosis Status  1. Disorder of left suprascapular nerve   2. Localized pain of left shoulder joint   3. Chronic right shoulder pain   4. S/P right rotator cuff repair   5. Right rotator cuff tear arthropathy   6. Chronic pain syndrome     Persistent Persistent Persistent     Plan of Care   [name] has a current medication list which includes the following long-term medication(s): carvedilol, donepezil, gabapentin, insulin lispro, loratadine, losartan, and rosuvastatin.   1. Disorder of left suprascapular nerve - AXILLARY NERVE BLOCK; Future - SUPRASCAPULAR NERVE BLOCK; Future  2. Localized pain of left shoulder joint - AXILLARY NERVE BLOCK; Future - SUPRASCAPULAR NERVE BLOCK; Future  3. Chronic right shoulder pain - AXILLARY NERVE BLOCK; Future - SUPRASCAPULAR NERVE BLOCK; Future  4. S/P right rotator cuff repair - AXILLARY NERVE BLOCK; Future - SUPRASCAPULAR NERVE BLOCK; Future  5. Right rotator cuff tear arthropathy - AXILLARY NERVE BLOCK; Future - SUPRASCAPULAR NERVE BLOCK; Future  6. Chronic pain syndrome - AXILLARY NERVE BLOCK; Future - SUPRASCAPULAR NERVE BLOCK; Future    Orders:  Orders Placed This Encounter  Procedures   AXILLARY NERVE BLOCK    Standing Status:   Future    Standing Expiration Date:   11/30/2022    Scheduling Instructions:     PROCEDURE: Axillary Nerve Block (CPT [phone])     LATERALITY: Bilateral     SEDATION: without     TIMEFRAME: ASAA    Order Specific Question:   Where will this procedure be performed?    Answer:   [facility]   SUPRASCAPULAR NERVE BLOCK    For shoulder pain.    Standing Status:   Future    Standing Expiration Date:   11/30/2022    Scheduling Instructions:     Purpose: Diagnostic     Laterality: Bilateral     Level(s): Suprascapular notch     Sedation: without     Scheduling Timeframe: As permitted by the schedule    Order Specific Question:   Where will this procedure be performed?    Answer:   [facility]   Follow-up plan:   Return in about 20 days (around 09/20/2022) for B/L Axillary and SSNB , in clinic [facility].    Recent Visits No visits were found meeting these conditions. Showing recent visits within past 90 days  and meeting all other requirements Today's Visits Date Type Provider Dept  08/31/22 Office Visit Edward Jolly, MD [facility]  Showing today's visits and meeting all other requirements Future Appointments No visits were found meeting these conditions. Showing future appointments within next 90 days and meeting all other requirements  I discussed the assessment and treatment plan with the patient. The patient was provided an opportunity to ask questions and all were answered. The patient agreed with the plan and demonstrated an understanding of the instructions.  Patient advised to call back or seek an in-person evaluation if the symptoms or condition worsens.  Duration of encounter: 30 minutes.  Total time on encounter, as per AMA guidelines included both the face-to-face and non-face-to-face time personally spent by the physician and/or other qualified health care professional(s) on the day of the encounter (includes time in activities that require the physician or other qualified health care professional and does not include time in activities normally performed by clinical staff). Physician's time may include the following activities when performed: preparing  to see the patient (eg, review of tests, pre-charting review of records) obtaining and/or reviewing separately obtained history performing a medically appropriate examination and/or evaluation counseling and educating the patient/family/caregiver ordering medications, tests, or procedures referring and communicating with other health care professionals (when not separately reported) documenting clinical information in the electronic or other health record independently interpreting results (not separately reported) and communicating results to the patient/ family/caregiver care coordination (not separately reported)  Note by: Edward Jolly, MD Date: 08/31/2022; Time: 10:16 AM

## 2022-08-31 NOTE — Telephone Encounter (Signed)
The patient had an appt this morning with Dr. Cherylann Ratel and he just called back to ask that we tell Dr. Cherylann Ratel that he is having nerve issues in both hands. They are throbbing.

## 2022-09-09 ENCOUNTER — Telehealth: Payer: Self-pay | Admitting: *Deleted

## 2022-09-09 NOTE — Telephone Encounter (Signed)
Patient said that he has never been contacted to schedule NCV, was it sent to [facility], not seeing in epic

## 2022-09-13 ENCOUNTER — Encounter: Payer: Self-pay | Admitting: Student in an Organized Health Care Education/Training Program

## 2022-09-13 NOTE — Telephone Encounter (Signed)
Yes Dr  Cherylann Ratel will review the results.

## 2022-09-15 ENCOUNTER — Other Ambulatory Visit: Payer: Medicare HMO

## 2022-09-17 NOTE — Telephone Encounter (Signed)
Faxed NCV order to [facility],confirmation has been received.

## 2022-09-20 ENCOUNTER — Telehealth: Payer: Self-pay | Admitting: *Deleted

## 2022-09-20 NOTE — Telephone Encounter (Signed)
Patient is seeing [facility] for NCV on May/12/24,is already an established patient there.

## 2022-09-26 NOTE — Progress Notes (Unsigned)
Psychiatric Initial Adult Assessment   Patient Identification: Nathan Dean MRN:  [id number] Date of Evaluation:  09/28/2022 Referral Source: Lonell Face, MD  Chief Complaint:   Chief Complaint  Patient presents with   Establish Care   Visit Diagnosis:    ICD-10-CM   1. Current moderate episode of major depressive disorder without prior episode (HCC)  F32.1 Ambulatory referral to Psychology      History of Present Illness:   Nathan Dean is a 67 y.o. year old male with a history of depression, memory concern with hydrocephalus status post VP shunt placement + history of repeated head trauma in setting of falls, diabetic peripheral neuropathy, hypertension, OSA on CPAP,  who is referred for depression.   He presents with his wife, Britta Mccreedy.  She provided the story first with the patient consent.  She states that he had many medical condition for the past four years. Although he was struggling to some extent due to issues with his job, it has gotten worse.  He started to have fall due to diabetic neuropathy.  He was found to have hydrocephalus, and has shunt.  He has pain in his shoulders due to using a walker.  He was found to have a kidney cancer, and had a procedure for it.  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  She has to call fire department when he falls.  He has lack of motivation. He sits in front of the chair without TV on for many hours. He does not do chores such as grocery shopping anymore, although he may drive around. He has issues with cognition (she states that there is neuropsych eval copied in the note by Dr. Sherryll Burger, documented in May 2022).  He appears to have hard time expressing himself.  He has issues with short term memory and tends to be rambling rather than getting to the point  Nathan Dean agrees with the story provided by Britta Mccreedy.  He states that he thinks about his childhood. He had a great relationship with his parents and his brother.  His mother died  in 41, and lost his brother 12 years ago. He does not have any original family anymore. He is worried about his health. He also reports frustration against [employer]. His mother died from endometrial cancer, and he was told that they would do nothing about it for people who may have been involved in the past with their products.  He agrees that he does not have any motivation to do things. He spends time, watching TV, internet, or play with dogs. He believes he would enjoy doing things if he were not to have any physical condition.  He used to enjoy playing basketball. It has been difficult since he had knee replacement, and had issues with his rotator cuff. He feels sorry for his wife as he is unable to do things with her.  Depression- The patient has mood symptoms as in PHQ-9/GAD-7. He sleeps 12 hours and feels fatigue. He reports no difference in fatigue since starting trazodone (had initial insomnia prior).  Although he is looking forward to going to a trip to [facility] with his wife, he is concerned about his physical health. He always enjoys taking care of his cats and dog. He reports loss of appetite, and has been on monjaro. He denies SI.   Cognition-he agrees that he has a hard time expressing himself.  He thinks states the combination of his mood, word-finding difficulty, and difficulty in  concentration  Medication- Duloxetine 60 mg, bupropion 450 mg daily (uptitrated a few months ago, some benefit for anger), gabapentin 900 mg tid  HBP 110-120s according to [facility] (amlodipine was discontinued due to recent low BP)  Labs TSH- 32.9 08/2022 vitamin d- wnl  Substance use  Tobacco Alcohol Other substances/  Current denies Mixed drink/1-2 a month denies  Past Quit in 2013 No issues with alcohol Pot in 1970's  Past Treatment       Wt Readings from Last 3 Encounters:  09/28/22 (!) 340 lb (154.2 kg)  08/31/22 (!) 344 lb (156 kg)  05/24/22 (!) 350 lb 8.5 oz (159 kg)     Household: wife, dog,  5 cats Marital status: married Number of children: 0 (2 step children) Employment: retired at age 25. Used to work for [employer] at [employer] Education:  associate degree Last PCP / ongoing medical evaluation:  He reports great relationships with his parents and his brother    Associated Signs/Symptoms: Depression Symptoms:  depressed mood, anhedonia, insomnia, fatigue, difficulty concentrating, anxiety, (Hypo) Manic Symptoms:   denies decreased need for sleep, euphoria Anxiety Symptoms:   mild anxiety Psychotic Symptoms:   denies AH, VH, paranoia PTSD Symptoms: Negative  Past Psychiatric History:  Outpatient: denies Psychiatry admission: denies Previous suicide attempt: denies Past trials of medication: bupropion History of violence: denies History of head injury: as above  Previous Psychotropic Medications: Yes   Substance Abuse History in the last 12 months:  No.  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    Diabetes (HCC)    GERD (gastroesophageal reflux disease)    Hyperlipidemia    Hypertension    Obesity    Sleep apnea     Past Surgical History:  Procedure Laterality Date   BACK SURGERY     in the 80's -herniated disc   BACK SURGERY     L4 bone spur 1990   CARPAL TUNNEL RELEASE Right 12/28/2016   Procedure: RIGHT ULNAR AND MEDIAN NEUROPLASTY AT WRIST;  Surgeon: Mack Hook, MD;  Location: [facility];  Service: Orthopedics;  Laterality: Right;   CARPAL TUNNEL RELEASE Left    25 years ago   CATARACT EXTRACTION W/ INTRAOCULAR LENS IMPLANT Bilateral    REPLACEMENT TOTAL KNEE BILATERAL  2014   left 2012 rt 2014   SHOULDER OPEN ROTATOR CUFF REPAIR Right 08/07/2019   Procedure: ROTATOR CUFF REPAIR SHOULDER OPEN;  Surgeon: Juanell Fairly, MD;  Location: [facility];  Service: Orthopedics;  Laterality:  Right;   SKIN GRAFT Left 06/19/2022   left side of neck for skin cancer on top of head   TONSILLECTOMY      Family Psychiatric History: as below  Family History:  Family History  Problem Relation Age of Onset   Ovarian cancer Mother    Kidney failure Father    Cancer Brother     Social History:   Social History   Socioeconomic History   Marital status: Married    Spouse name: Not on file   Number of children: 0   Years of education: Not on file   Highest education level: Associate degree: academic program  Occupational History   Occupation: Currently Working   Tobacco Use   Smoking status: Former    Packs/day: 2.00    Years: 20.00    Additional pack years: 0.00    Total pack  years: 40.00    Types: Cigarettes    Quit date: 06/28/1996    Years since quitting: 26.2   Smokeless tobacco: Never  Vaping Use   Vaping Use: Never used  Substance and Sexual Activity   Alcohol use: Yes    Comment: occasionally   Drug use: No   Sexual activity: Not on file    Comment: not asked if sexually active  Other Topics Concern   Not on file  Social History Narrative   Right handed    Lives at home spouse    Caffeine 5 -6 cups     Social Determinants of Health   Financial Resource Strain: Not on file  Food Insecurity: No Food Insecurity (05/24/2022)   Hunger Vital Sign    Worried About Running Out of Food in the Last Year: Never true    Ran Out of Food in the Last Year: Never true  Transportation Needs: No Transportation Needs (05/24/2022)   PRAPARE - Administrator, Civil Service (Medical): No    Lack of Transportation (Non-Medical): No  Physical Activity: Not on file  Stress: Not on file  Social Connections: Not on file    Additional Social History: as above  Allergies:   Allergies  Allergen Reactions   Penicillins Anaphylaxis, Rash and Other (See Comments)   Succinylcholine Chloride Anaphylaxis and Rash   Keflex [Cephalexin] Rash   Sulfa Antibiotics Rash  and Other (See Comments)    Metabolic Disorder Labs: Lab Results  Component Value Date   HGBA1C 7.5 (H) 05/23/2022   MPG 169 05/23/2022   MPG 148.46 08/07/2019   No results found for: "PROLACTIN" Lab Results  Component Value Date   CHOL 199 11/04/2016   TRIG 405 (H) 11/04/2016   HDL 48 11/04/2016   CHOLHDL 4.1 11/04/2016   LDLCALC Comment 11/04/2016   Lab Results  Component Value Date   TSH 1.062 08/16/2019    Therapeutic Level Labs: No results found for: "LITHIUM" No results found for: "CBMZ" No results found for: "VALPROATE"  Current Medications: Current Outpatient Medications  Medication Sig Dispense Refill   albuterol (VENTOLIN HFA) 108 (90 Base) MCG/ACT inhaler Inhale 2 puffs into the lungs every 4 (four) hours as needed.     aspirin EC 81 MG tablet Take 81 mg by mouth daily.     buPROPion (WELLBUTRIN XL) 300 MG 24 hr tablet Take 450 mg by mouth daily.     carvedilol (COREG) 12.5 MG tablet Take 1 tablet (12.5 mg total) by mouth 2 (two) times daily with a meal.     cholecalciferol (VITAMIN D3) 25 MCG (1000 UNIT) tablet Take 2,000 Units by mouth daily.     cyanocobalamin (VITAMIN B12) 1000 MCG tablet Take 5,000 mcg by mouth daily.     donepezil (ARICEPT) 10 MG tablet Take 10 mg by mouth at bedtime.     DULoxetine (CYMBALTA) 30 MG capsule Take 1 capsule (30 mg total) by mouth daily. Take total of 90 mg daily. Take along with 60 mg cap 30 capsule 2   DULoxetine (CYMBALTA) 60 MG capsule Take 60 mg by mouth daily.     gabapentin (NEURONTIN) 300 MG capsule Take 3 capsules (900 mg total) by mouth 3 (three) times daily.     insulin glargine (LANTUS) 100 UNIT/ML injection Inject 30 Units into the skin daily.     insulin lispro (HUMALOG) 100 UNIT/ML injection Inject 18 Units into the skin in the morning and at bedtime. 18 in am. 8  in pm     loratadine (CLARITIN) 10 MG tablet Take 10 mg by mouth daily as needed.     losartan (COZAAR) 100 MG tablet Take 1 tablet (100 mg total) by  mouth daily. 30 tablet 0   magnesium oxide (MAG-OX) 400 (240 Mg) MG tablet Take 500 mg by mouth daily.     Melatonin 10 MG TABS Take 20 mg by mouth at bedtime as needed (sleep).     metFORMIN (GLUCOPHAGE) 500 MG tablet Take 1,000 mg by mouth 2 (two) times daily with a meal.     methocarbamol (ROBAXIN) 750 MG tablet Take 750 mg by mouth 4 (four) times daily.     mometasone (ELOCON) 0.1 % cream Apply 1 Application topically daily.     omeprazole (PRILOSEC) 20 MG capsule Take 20 mg by mouth daily.      ondansetron (ZOFRAN-ODT) 4 MG disintegrating tablet Take 8 mg by mouth every 8 (eight) hours as needed for nausea.     rosuvastatin (CRESTOR) 20 MG tablet Take 20 mg by mouth daily.     SEMGLEE, YFGN, 100 UNIT/ML Pen Inject 30 Units into the skin at bedtime.     senna-docusate (SENOKOT-S) 8.6-50 MG tablet Take 2 tablets by mouth daily.     tirzepatide [facility]) 2.5 MG/0.5ML Pen Inject 2.5 mg into the skin once a week.     traZODone (DESYREL) 50 MG tablet Take 1 tablet by mouth at bedtime.     amLODipine (NORVASC) 10 MG tablet Take 10 mg by mouth daily. (Patient not taking: Reported on 09/28/2022)     No current facility-administered medications for this visit.    Musculoskeletal: Strength & Muscle Tone: within normal limits Gait & Station:  uses a walker Patient leans: N/A  Psychiatric Specialty Exam: Review of Systems  Psychiatric/Behavioral:  Positive for decreased concentration, dysphoric mood and sleep disturbance. Negative for agitation, behavioral problems, confusion, hallucinations, self-injury and suicidal ideas. The patient is nervous/anxious. The patient is not hyperactive.   All other systems reviewed and are negative.   Blood pressure (!) 153/72, pulse 90, temperature 97.9 F (36.6 C), temperature source Skin, height 6\' 9"  (2.057 m), weight (!) 340 lb (154.2 kg).Body mass index is 36.43 kg/m.  General Appearance: Fairly Groomed  Eye Contact:  Good  Speech:  Clear and Coherent   Volume:  Normal  Mood:  Depressed  Affect:  Appropriate, Congruent, and slightly restricted, reactive  Thought Process:  Coherent and Goal Directed  Orientation:  Full (Time, Place, and Person)  Thought Content:  Logical  Suicidal Thoughts:  No  Homicidal Thoughts:  No  Memory:  Immediate;   Good  Judgement:  Good  Insight:  Good  Psychomotor Activity:  Normal  Concentration:  Concentration: Good and Attention Span: Good  Recall:  Good  Fund of Knowledge:Good  Language: Good  Akathisia:  No  Handed:  Right  AIMS (if indicated):  not done  Assets:  Communication Skills Desire for Improvement  ADL's:  Intact  Cognition: WNL  Sleep:   hypersomnia   Screenings: GAD-7    Flowsheet Row Office Visit from 09/28/2022 in [facility]  Total GAD-7 Score 12      PHQ2-9    Flowsheet Row Office Visit from 09/28/2022 in [facility] Procedure visit from 05/12/2022 in [facility] Procedure visit from 09/16/2021 in [facility] Visit from 05/26/2018  in Primary Care at [facility] Visit from 04/18/2018 in Primary Care at [facility] Total Score 5 0 0 0 0  PHQ-9 Total Score 21 -- -- -- --      Flowsheet Row ED to Hosp-Admission (Discharged) from 05/23/2022 in [facility] 1C MEDICAL TELEMETRY ED from 09/13/2020 in [facility] Department at [facility]  C-SSRS RISK CATEGORY No Risk No Risk       Assessment and Plan:  Nathan Dean is a 67 y.o. year old male with a history of depression, memory concern with hydrocephalus status post VP shunt placement + history of repeated head trauma in setting of falls, diabetic peripheral neuropathy, hypertension, OSA on CPAP,  who is referred for depression.   1. Current moderate episode of major  depressive disorder without prior episode (HCC) Acute stressors include:  Other stressors include: unsteady gait secondary to hydrocephalus s/p shunt placement, diabetic neuropathy, losses of his close parents, brother several years ago, unemployment    History:  no prior history of depression prior to hydrocephalus  He reports depressive symptoms in the context of stressors as above, although there has been slight improvement since bupropion was up titrated by his PCP. While his current mood symptoms be largely secondary to demoralization. However, given he reports benefit from higher dose of bupropion, will try uptitration of duloxetine to optimize treatment for depression and diabetic neuropathy. Discussed potential risk of hypertension, headache.  Noted that although he has hypertensive on today's evaluation, his home BP has been within good control according to the caregiver.  Will continue to assess this.  Will continue bupropion as adjunctive treatment for depression.  He will greatly benefit from CBT/supportive therapy; will make referral.   # cognitive impairment - on donepezil, prescribed by neurology - hydrocephalus status post VP shunt placement + history of repeated head trauma in setting of fall It is multifactorial, and he does have mood symptoms as above.  Will continue to assess.   Plan Increase duloxetine 90 mg daily  Continue bupropion 450 mg daily  Next appointment: 7/23 at 10:30 for 30 mins, IP Referral to therapy- le bauer - on monjaro - on trazodone, gabapentin 900 mg tid  The patient demonstrates the following risk factors for suicide: Chronic risk factors for suicide include: psychiatric disorder of depression and chronic pain. Acute risk factors for suicide include: unemployment and loss (financial, interpersonal, professional). Protective factors for this patient include: positive social support, coping skills, and hope for the future. Considering these factors, the  overall suicide risk at this point appears to be low. Patient is appropriate for outpatient follow up.   Collaboration of Care: Other reviewed notes in Epic  Patient/Guardian was advised Release of Information must be obtained prior to any record release in order to collaborate their care with an outside provider. Patient/Guardian was advised if they have not already done so to contact the registration department to sign all necessary forms in order for [name] to release information regarding their care.   Consent: Patient/Guardian gives verbal consent for treatment and assignment of benefits for services provided during this visit. Patient/Guardian expressed understanding and agreed to proceed.   Neysa Hotter, MD 5/14/20243:26 PM

## 2022-09-28 ENCOUNTER — Ambulatory Visit (INDEPENDENT_AMBULATORY_CARE_PROVIDER_SITE_OTHER): Payer: Medicare HMO | Admitting: Psychiatry

## 2022-09-28 ENCOUNTER — Encounter: Payer: Self-pay | Admitting: Psychiatry

## 2022-09-28 VITALS — BP 153/72 | HR 90 | Temp 97.9°F | Ht >= 80 in | Wt 340.0 lb

## 2022-09-28 DIAGNOSIS — F321 Major depressive disorder, single episode, moderate: Secondary | ICD-10-CM

## 2022-09-28 MED ORDER — DULOXETINE HCL 30 MG PO CPEP: 30.0000 mg | ORAL_CAPSULE | Freq: Every day | ORAL | 2 refills | Status: DC

## 2022-09-28 NOTE — Patient Instructions (Signed)
Increase duloxetine 90 mg daily  Continue bupropion 450 mg daily  Next appointment: 7/23 at 10:30

## 2022-09-29 ENCOUNTER — Ambulatory Visit
Payer: Medicare HMO | Attending: Student in an Organized Health Care Education/Training Program | Admitting: Student in an Organized Health Care Education/Training Program

## 2022-09-29 ENCOUNTER — Encounter: Payer: Self-pay | Admitting: Student in an Organized Health Care Education/Training Program

## 2022-09-29 ENCOUNTER — Ambulatory Visit
Admission: RE | Admit: 2022-09-29 | Discharge: 2022-09-29 | Disposition: A | Discharge status: Home / Self Care | Payer: Medicare HMO | Source: Ambulatory Visit | Attending: Student in an Organized Health Care Education/Training Program | Admitting: Student in an Organized Health Care Education/Training Program

## 2022-09-29 VITALS — BP 160/97 | HR 80 | Temp 96.7°F | Resp 16 | Ht 77.0 in | Wt 340.0 lb

## 2022-09-29 DIAGNOSIS — G8929 Other chronic pain: Secondary | ICD-10-CM | POA: Diagnosis present

## 2022-09-29 DIAGNOSIS — M25511 Pain in right shoulder: Secondary | ICD-10-CM | POA: Diagnosis present

## 2022-09-29 DIAGNOSIS — M75101 Unspecified rotator cuff tear or rupture of right shoulder, not specified as traumatic: Secondary | ICD-10-CM | POA: Insufficient documentation

## 2022-09-29 DIAGNOSIS — Z9889 Other specified postprocedural states: Secondary | ICD-10-CM | POA: Diagnosis present

## 2022-09-29 DIAGNOSIS — G5682 Other specified mononeuropathies of left upper limb: Secondary | ICD-10-CM | POA: Diagnosis present

## 2022-09-29 DIAGNOSIS — M4802 Spinal stenosis, cervical region: Secondary | ICD-10-CM | POA: Diagnosis present

## 2022-09-29 DIAGNOSIS — G540 Brachial plexus disorders: Secondary | ICD-10-CM | POA: Insufficient documentation

## 2022-09-29 DIAGNOSIS — M12811 Other specific arthropathies, not elsewhere classified, right shoulder: Secondary | ICD-10-CM | POA: Insufficient documentation

## 2022-09-29 DIAGNOSIS — M25512 Pain in left shoulder: Secondary | ICD-10-CM | POA: Diagnosis present

## 2022-09-29 DIAGNOSIS — M5412 Radiculopathy, cervical region: Secondary | ICD-10-CM | POA: Diagnosis present

## 2022-09-29 DIAGNOSIS — G894 Chronic pain syndrome: Secondary | ICD-10-CM | POA: Diagnosis present

## 2022-09-29 MED ORDER — LIDOCAINE HCL 2 % IJ SOLN
20.0000 mL | Freq: Once | INTRAMUSCULAR | Status: AC
  Administered 2022-09-29: 400 mg

## 2022-09-29 MED ORDER — LIDOCAINE HCL 2 % IJ SOLN
INTRAMUSCULAR | Status: AC
  Filled 2022-09-29: qty 20

## 2022-09-29 MED ORDER — IOHEXOL 180 MG/ML  SOLN
10.0000 mL | Freq: Once | INTRAMUSCULAR | Status: AC
  Administered 2022-09-29: 10 mL via INTRA_ARTICULAR

## 2022-09-29 MED ORDER — ROPIVACAINE HCL 2 MG/ML IJ SOLN
INTRAMUSCULAR | Status: AC
  Filled 2022-09-29: qty 20

## 2022-09-29 MED ORDER — DEXAMETHASONE SODIUM PHOSPHATE 10 MG/ML IJ SOLN
10.0000 mg | Freq: Once | INTRAMUSCULAR | Status: AC
  Administered 2022-09-29: 10 mg

## 2022-09-29 MED ORDER — METHYLPREDNISOLONE ACETATE 80 MG/ML IJ SUSP
80.0000 mg | Freq: Once | INTRAMUSCULAR | Status: AC
  Administered 2022-09-29: 80 mg via INTRA_ARTICULAR

## 2022-09-29 MED ORDER — ROPIVACAINE HCL 2 MG/ML IJ SOLN
9.0000 mL | Freq: Once | INTRAMUSCULAR | Status: AC
  Administered 2022-09-29: 9 mL via INTRA_ARTICULAR

## 2022-09-29 MED ORDER — IOHEXOL 180 MG/ML  SOLN
INTRAMUSCULAR | Status: AC
  Filled 2022-09-29: qty 20

## 2022-09-29 MED ORDER — METHYLPREDNISOLONE ACETATE 80 MG/ML IJ SUSP
INTRAMUSCULAR | Status: AC
  Filled 2022-09-29: qty 1

## 2022-09-29 MED ORDER — DEXAMETHASONE SODIUM PHOSPHATE 10 MG/ML IJ SOLN
INTRAMUSCULAR | Status: AC
  Filled 2022-09-29: qty 2

## 2022-09-29 NOTE — Patient Instructions (Signed)

## 2022-09-29 NOTE — Progress Notes (Signed)
PROVIDER NOTE: Interpretation of information contained herein should be left to medically-trained personnel. Specific patient instructions are provided elsewhere under "Patient Instructions" section of medical record. This document was created in part using STT-dictation technology, any transcriptional errors that may result from this process are unintentional.  Patient: Nathan Dean Type: Established DOB: 04/29/1956 MRN: [id number] PCP: Nathan Kin, MD  Service: Procedure DOS: 09/29/2022 Setting: Ambulatory Location: Ambulatory outpatient facility Delivery: Face-to-face Provider: Edward Jolly, MD Specialty: Interventional Pain Management Specialty designation: 09 Location: Outpatient facility Ref. Prov.: Nathan Dean,*       Interventional Therapy   ProcedureSuprascapular nerve block (SSNB) and Axillary Nerve Block Laterality:  Bilateral  Level: Superior to scapular spine, lateral to supraspinatus fossa (Suprascapular notch) for suprascapular nerve block, inferior deltoid muscle for axillary nerve block Imaging: Fluoroscopic guidance         Anesthesia: Local anesthesia (1-2% Lidocaine) DOS: 09/29/2022  Performed by: Nathan Jolly, MD  Purpose: Diagnostic/Therapeutic Indications: Shoulder pain, severe enough to impact quality of life and/or function. 1. Disorder of left suprascapular nerve   2. Localized pain of left shoulder joint   3. Chronic right shoulder pain   4. S/P right rotator cuff repair   5. Right rotator cuff tear arthropathy   6. Cervical radicular pain   7. Spinal stenosis in cervical region   8. Chronic pain syndrome    NAS-11 score:   Pre-procedure: 8 /10   Post-procedure: 0-No pain/10     Target: Suprascapular nerve Location: midway between the medial border of the scapula and the acromion as it runs through the suprascapular notch. Region: Suprascapular, posterior shoulder  Approach: Percutaneous  Neuroanatomy: The suprascapular nerve  is the lateral branch of the superior trunk of the brachial plexus. It receives nerve fibers that originate in the nerve roots C5 and C6 (and sometimes C4). It is a mixed nerve, meaning that it provides both sensory and motor supply for the suprascapular region. Function: The main function of this nerve is to provide motor innervation for two muscles, the supraspinatus and infraspinatus muscles. They are part of the rotator cuff muscles. In addition, the suprascapular nerve provides a sensory supply to the joints of the scapula (glenohumeral and acromioclavicular joints). Rationale (medical necessity): procedure needed and Nathan Dean for the diagnosis and/or treatment of the patient's medical symptoms and needs.  Position / Prep / Materials:  Position: Prone Materials:  Tray: Block Needle(s):  Type: Spinal  Gauge (G): 22  Length: 3.5 in.  Qty: 1 Prep solution: DuraPrep (Iodine Povacrylex [0.7% available iodine] and Isopropyl Alcohol, 74% w/w) Prep Area: Entire posterior shoulder area. From upper spine to shoulder Nathan Dean (upper arm), and from lateral neck to lower tip of shoulder blade.   Pre-op H&P Assessment:  [name] is a 67 y.o. (year old), male patient, seen today for interventional treatment. He  has a past surgical history that includes Replacement total knee bilateral (2014); Carpal tunnel release (Right, 12/28/2016); Carpal tunnel release (Left); Tonsillectomy; Cataract extraction w/ intraocular lens implant (Bilateral); Back surgery; Back surgery; Shoulder open rotator cuff repair (Right, 08/07/2019); and Skin graft (Left, 06/19/2022). [name] has a current medication list which includes the following prescription(s): albuterol, aspirin ec, bupropion, carvedilol, cholecalciferol, cyanocobalamin, donepezil, duloxetine, duloxetine, gabapentin, insulin glargine, insulin lispro, loratadine, losartan, magnesium oxide, melatonin, metformin, methocarbamol, mometasone, omeprazole, ondansetron,  rosuvastatin, semglee (yfgn), senna-docusate, tirzepatide, trazodone, and amlodipine. His primarily concern today is the Shoulder Pain (Bilateral )  Initial Vital Signs:  Pulse/HCG Rate: 81  Temp: Marland Kitchen)  96.7 F (35.9 C) Resp: 16 BP: 131/85 SpO2: 95 %  BMI: Estimated body mass index is 40.32 kg/m as calculated from the following:   Height as of this encounter: 6\' 5"  (1.956 m).   Weight as of this encounter: 340 lb (154.2 kg).  Risk Assessment: Allergies: Reviewed. He is allergic to penicillins, succinylcholine chloride, keflex [cephalexin], and sulfa antibiotics.  Allergy Precautions: None required Coagulopathies: Reviewed. None identified.  Blood-thinner therapy: None at this time Active Infection(s): Reviewed. None identified. [name] is afebrile  Site Confirmation: [name] was asked to confirm the procedure and laterality before marking the site Procedure checklist: Completed Consent: Before the procedure and under the influence of no sedative(s), amnesic(s), or anxiolytics, the patient was informed of the treatment options, risks and possible complications. To fulfill our ethical and legal obligations, as recommended by the American Medical Association's Code of Ethics, I have informed the patient of my clinical impression; the nature and purpose of the treatment or procedure; the risks, benefits, and possible complications of the intervention; the alternatives, including doing nothing; the risk(s) and benefit(s) of the alternative treatment(s) or procedure(s); and the risk(s) and benefit(s) of doing nothing. The patient was provided information about the general risks and possible complications associated with the procedure. These may include, but are not limited to: failure to achieve desired goals, infection, bleeding, organ or nerve damage, allergic reactions, paralysis, and death. In addition, the patient was informed of those risks and complications associated to the  procedure, such as failure to decrease pain; infection; bleeding; organ or nerve damage with subsequent damage to sensory, motor, and/or autonomic systems, resulting in permanent pain, numbness, and/or weakness of one or several areas of the body; allergic reactions; (i.e.: anaphylactic reaction); and/or death. Furthermore, the patient was informed of those risks and complications associated with the medications. These include, but are not limited to: allergic reactions (i.e.: anaphylactic or anaphylactoid reaction(s)); adrenal axis suppression; blood sugar elevation that in diabetics may result in ketoacidosis or comma; water retention that in patients with history of congestive heart failure may result in shortness of breath, pulmonary edema, and decompensation with resultant heart failure; weight gain; swelling or edema; medication-induced neural toxicity; particulate matter embolism and blood vessel occlusion with resultant organ, and/or nervous system infarction; and/or aseptic necrosis of one or more joints. Finally, the patient was informed that Medicine is not an exact science; therefore, there is also the possibility of unforeseen or unpredictable risks and/or possible complications that may result in a catastrophic outcome. The patient indicated having understood very clearly. We have given the patient no guarantees and we have made no promises. Enough time was given to the patient to ask questions, all of which were answered to the patient's satisfaction. Mr. Nelli has indicated that he wanted to continue with the procedure. Attestation: I, the ordering provider, attest that I have discussed with the patient the benefits, risks, side-effects, alternatives, likelihood of achieving goals, and potential problems during recovery for the procedure that I have provided informed consent. Date  Time: 09/29/2022 11:28 AM  Pre-Procedure Preparation:  Monitoring: As per clinic protocol. Respiration, ETCO2,  SpO2, BP, heart rate and rhythm monitor placed and checked for adequate function Safety Precautions: Patient was assessed for positional comfort and pressure points before starting the procedure. Time-out: I initiated and conducted the "Time-out" before starting the procedure, as per protocol. The patient was asked to participate by confirming the accuracy of the "Time Out" information. Verification of the correct person, site,  and procedure were performed and confirmed by me, the nursing staff, and the patient. "Time-out" conducted as per Joint Commission's Universal Protocol (UP.01.01.01). Time: 1228 Start Time: 1228 hrs.  Description of Procedure:          Procedural Technique Safety Precautions: Aspiration looking for blood return was conducted prior to all injections. At no point did we inject any substances, as a needle was being advanced. No attempts were made at seeking any paresthesias. Safe injection practices and needle disposal techniques used. Medications properly checked for expiration dates. SDV (single dose vial) medications used. Description of the Procedure: Protocol guidelines were followed. The patient was placed in position over the procedure table. The target area was identified and the area prepped in the usual manner. Skin & deeper tissues infiltrated with local anesthetic. Appropriate amount of time allowed to pass for local anesthetics to take effect. The procedure needles were then advanced to the target area. Nathan Dean needle placement secured. Negative aspiration confirmed. Solution injected in intermittent fashion, asking for systemic symptoms every 0.5cc of injectate. The needles were then removed and the area cleansed, making sure to leave some of the prepping solution back to take advantage of its long term bactericidal properties.  10 cc solution made of 8 cc of 0.2% ropivacaine, 2 cc of Decadron 10 mg/cc.  5 cc injected for the left suprascapular nerve after contrast  confirmation, 5 cc injected for the right suprascapular nerve after contrast confirmation  Afterwards axillary nerve block was also performed under fluoroscopic guidance.  Approximately 6 cm below the acromion, needle was advanced to the deltoid, retracted approximately 2 cm, patient noted paresthesias around the axillary nerve distribution. 10 cc solution made of 9 cc of 0.2% ropivacaine, 1 cc of methylprednisolone, 80 mg/cc.  5 cc injected for the left axillary nerve, 5 cc injected for the right axillary nerve   Vitals:   09/29/22 1210 09/29/22 1220 09/29/22 1230 09/29/22 1240  BP: (!) 146/97 (!) 154/92 (!) 157/98 (!) 160/97  Pulse: 86 80 79 80  Resp: 18 17 16 16   Temp:      TempSrc:      SpO2: 96% 97% 96% 97%  Weight:      Height:         Start Time: 1228 hrs. End Time: 1239 hrs.  Imaging Guidance (Spinal):          Type of Imaging Technique: Fluoroscopy Guidance (Spinal) Indication(s): Assistance in needle guidance and placement for procedures requiring needle placement in or near specific anatomical locations not easily accessible without such assistance. Exposure Time: Please see nurses notes. Contrast: Before injecting any contrast, we confirmed that the patient did not have an allergy to iodine, shellfish, or radiological contrast. Once satisfactory needle placement was completed at the desired level, radiological contrast was injected. Contrast injected under live fluoroscopy. No contrast complications. See chart for type and volume of contrast used. Fluoroscopic Guidance: I was personally present during the use of fluoroscopy. "Tunnel Vision Technique" used to obtain the best possible view of the target area. Parallax error corrected before commencing the procedure. "Direction-depth-direction" technique used to introduce the needle under continuous pulsed fluoroscopy. Once target was reached, antero-posterior, oblique, and lateral fluoroscopic projection used confirm needle  placement in all planes. Images permanently stored in EMR. Interpretation: I personally interpreted the imaging intraoperatively. Adequate needle placement confirmed in multiple planes. Appropriate spread of contrast into desired area was observed. No evidence of afferent or efferent intravascular uptake. No intrathecal or subarachnoid spread  observed. Permanent images saved into the patient's record.  Post-operative Assessment:  Post-procedure Vital Signs:  Pulse/HCG Rate: 80  Temp: (!) 96.7 F (35.9 C) Resp: 16 BP: (!) 160/97 SpO2: 97 %  EBL: None  Complications: No immediate post-treatment complications observed by team, or reported by patient.  Note: The patient tolerated the entire procedure well. A repeat set of vitals were taken after the procedure and the patient was kept under observation following institutional policy, for this type of procedure. Post-procedural neurological assessment was performed, showing return to baseline, prior to discharge. The patient was provided with post-procedure discharge instructions, including a section on how to identify potential problems. Should any problems arise concerning this procedure, the patient was given instructions to immediately contact us, at any time, without hesitation. In any case, we plan to contact the patient by telephone for a follow-up status report regarding this interventional procedure.  Comments:  No additional relevant information.  Plan of Care (POC)  Patient states that he is also experiencing increased bilateral arm pain with numbness and tingling of both hands.  Cervical CT shows moderate canal stenosis at C5-C6 multilevel cervical degenerative disc disease.  We discussed a cervical epidural steroid injection for the management of cervical radicular pain.  Risk and benefits reviewed and patient would like to proceed.  CT cervical spine without contrast with post-processing images.   CLINICAL INDICATION: Neck pain,  injury.   TECHNIQUE: Axial contiguous high resolution noncontrast images from skull  base through the cervical spine. Post-processing images to include 2-D  coronal and sagittal reformats. Dose reduction was obtained with either  Automated Exposure Control (AEC) or, if AEC could not be utilized, by  adjustment of the mA and/or kV according to patient size.   COMPARISON: None.   FINDINGS: There is mild reversal of the normal C-spine lordosis. Study is  degraded for the lower cervical spine by patient body habitus.   No fracture is detected. There is approximately 2 mm anterolisthesis T1-2  which is likely degenerative.   Other levels show normal alignment.   There is degenerative disc disease and spondylosis and facet degenerative  change.   There are degenerative changes anteriorly at C1-2.   At C3 to-3, there is mild disc bulge with mild central canal stenosis but  no foraminal stenosis   At C3-4, there is mild disc bulge with mild central canal stenosis and mild  bilateral foraminal encroachment.   At C4-5, there is disc degeneration. There is disc bulge and spondylosis  with mild to moderate central canal stenosis and mild bilateral foraminal  stenosis.   At C5-6, the disc is degenerated. There is diffuse disc bulge and  spondylosis. There is moderate central canal stenosis. There is moderate  bilateral foraminal stenosis.   At C6-7, the disc is degenerated. There is diffuse disc bulge and  spondylosis. There is moderate central canal stenosis and moderate right  and mild to moderate left foraminal stenosis   At C7-T1, the disc is degenerated. There is no significant central or  foraminal stenosis.   This level is degraded by artifact. No significant central canal stenosis  is detected. There is moderate to severe right and moderate left foraminal  stenosis.   There are atherosclerotic changes in the carotid arteries in the neck  There is no lymphadenopathy.  The  thyroid gland is unremarkable.  The included portions of the lung apices are free of infiltrate.   IMPRESSION:  1. Reversal of normal C-spine lordosis  2.  Negative for fracture  3. Minor subluxation T1-2  4. Multilevel degenerative disc disease and spondylosis. Facet degenerative  change. Moderate central canal stenosis at C5-6 and C6-7 with milder  central canal stenosis at other levels. See individual levels above for  more complete description   Electronically Signed by:  Ebony Hail, MD, [facility]  Electronically Signed on:  09/09/2022 7:04 PM    Orders:  Orders Placed This Encounter  Procedures   Cervical Epidural Injection    Sedation: without Purpose: Diagnostic/Therapeutic Indication(s): Radiculitis and cervicalgia associater with cervical degenerative disc disease.    Standing Status:   Future    Standing Expiration Date:   12/30/2022    Scheduling Instructions:     Procedure: Cervical Epidural Steroid Injection/Block     Level(s): C7-T1     Laterality: TBD     Timeframe: As soon as schedule allows    Order Specific Question:   Where will this procedure be performed?    Answer:   [facility]    Comments:   Aleisa Howk   [facility] C-ARM 1-60 MIN NO REPORT    Intraoperative interpretation by procedural physician at [facility].    Standing Status:   Standing    Number of Occurrences:   1    Order Specific Question:   Reason for exam:    Answer:   Assistance in needle guidance and placement for procedures requiring needle placement in or near specific anatomical locations not easily accessible without such assistance.   Medications ordered for procedure: Meds ordered this encounter  Medications   iohexol (OMNIPAQUE) 180 MG/ML injection 10 mL    Must be Myelogram-compatible. If not available, you may substitute with a water-soluble, non-ionic, hypoallergenic, myelogram-compatible radiological contrast medium.   lidocaine (XYLOCAINE) 2 %  (with pres) injection 400 mg   dexamethasone (DECADRON) injection 10 mg   dexamethasone (DECADRON) injection 10 mg   methylPREDNISolone acetate (DEPO-MEDROL) injection 80 mg   ropivacaine (PF) 2 mg/mL (0.2%) (NAROPIN) injection 9 mL     Follow-up plan:   Return in about 3 weeks (around 10/20/2022) for CESI.       Right SSNB #1 08/10/21, pRFA 12/07/21, Left SSNB #1 02/17/22, Left SSN pRFA 05/12/22, bilateral suprascapular nerve block, axillary nerve block, 09/29/2022      Recent Visits Date Type Provider Dept  08/31/22 Office Visit Nathan Jolly, MD [facility]  Showing recent visits within past 90 days and meeting all other requirements Today's Visits Date Type Provider Dept  09/29/22 Procedure visit Nathan Jolly, MD [facility]  Showing today's visits and meeting all other requirements Future Appointments No visits were found meeting these conditions. Showing future appointments within next 90 days and meeting all other requirements  Disposition: Discharge home  Discharge (Date  Time): 09/29/2022; 1246 hrs.   Primary Care Physician: Nathan Kin, MD Location: [facility] Note by: Nathan Jolly, MD (TTS technology used. I apologize for any typographical errors that were not detected and corrected.) Date: 09/29/2022; Time: 3:13 PM  Disclaimer:  Medicine is not an Visual merchandiser. The only guarantee in medicine is that nothing is guaranteed. It is important to note that the decision to proceed with this intervention was based on the information collected from the patient. The Data and conclusions were drawn from the patient's questionnaire, the interview, and the physical examination. Because the information was provided in large part by the patient, it cannot be guaranteed that it has not  been purposely or unconsciously manipulated. Every effort has been made to obtain as much relevant data as possible for this evaluation. It is  important to note that the conclusions that lead to this procedure are derived in large part from the available data. Always take into account that the treatment will also be dependent on availability of resources and existing treatment guidelines, considered by other Pain Management Practitioners as being common knowledge and practice, at the time of the intervention. For Medico-Legal purposes, it is also important to point out that variation in procedural techniques and pharmacological choices are the acceptable norm. The indications, contraindications, technique, and results of the above procedure should only be interpreted and judged by a Board-Certified Interventional Pain Specialist with extensive familiarity and expertise in the same exact procedure and technique.

## 2022-09-30 ENCOUNTER — Telehealth: Payer: Self-pay | Admitting: *Deleted

## 2022-09-30 NOTE — Telephone Encounter (Signed)
"  I'd like to get the results of the procedure that Dr. Logan Bores did on my right foot.  You can send that via email to [email or web address]."

## 2022-09-30 NOTE — Telephone Encounter (Signed)
No problems post procedure. 

## 2022-09-30 NOTE — Telephone Encounter (Signed)
I called the patient to see if he had been taken care of.  He said he did not have any questions at this time.

## 2022-10-13 ENCOUNTER — Ambulatory Visit: Payer: Medicare HMO | Admitting: Cardiology

## 2022-10-18 ENCOUNTER — Emergency Department: Payer: Medicare HMO

## 2022-10-18 ENCOUNTER — Emergency Department
Admission: EM | Admit: 2022-10-18 | Discharge: 2022-10-18 | Disposition: A | Discharge status: Home / Self Care | Payer: Medicare HMO | Attending: Emergency Medicine | Admitting: Emergency Medicine

## 2022-10-18 ENCOUNTER — Other Ambulatory Visit: Payer: Self-pay

## 2022-10-18 DIAGNOSIS — R509 Fever, unspecified: Secondary | ICD-10-CM | POA: Diagnosis present

## 2022-10-18 DIAGNOSIS — Z79899 Other long term (current) drug therapy: Secondary | ICD-10-CM | POA: Insufficient documentation

## 2022-10-18 DIAGNOSIS — M62831 Muscle spasm of calf: Secondary | ICD-10-CM | POA: Diagnosis not present

## 2022-10-18 DIAGNOSIS — Z1152 Encounter for screening for COVID-19: Secondary | ICD-10-CM | POA: Diagnosis not present

## 2022-10-18 DIAGNOSIS — E1140 Type 2 diabetes mellitus with diabetic neuropathy, unspecified: Secondary | ICD-10-CM | POA: Insufficient documentation

## 2022-10-18 DIAGNOSIS — M7989 Other specified soft tissue disorders: Secondary | ICD-10-CM | POA: Insufficient documentation

## 2022-10-18 DIAGNOSIS — I10 Essential (primary) hypertension: Secondary | ICD-10-CM | POA: Diagnosis not present

## 2022-10-18 DIAGNOSIS — L03116 Cellulitis of left lower limb: Secondary | ICD-10-CM

## 2022-10-18 DIAGNOSIS — M62838 Other muscle spasm: Secondary | ICD-10-CM

## 2022-10-18 LAB — CBC
HCT: 41.6 % (ref 39.0–52.0)
Hemoglobin: 12.8 g/dL — ABNORMAL LOW (ref 13.0–17.0)
MCH: 26.9 pg (ref 26.0–34.0)
MCHC: 30.8 g/dL (ref 30.0–36.0)
MCV: 87.6 fL (ref 80.0–100.0)
Platelets: 161 10*3/uL (ref 150–400)
RBC: 4.75 MIL/uL (ref 4.22–5.81)
RDW: 15.9 % — ABNORMAL HIGH (ref 11.5–15.5)
WBC: 9.2 10*3/uL (ref 4.0–10.5)
nRBC: 0 % (ref 0.0–0.2)

## 2022-10-18 LAB — HEPATIC FUNCTION PANEL
ALT: 27 U/L (ref 0–44)
AST: 38 U/L (ref 15–41)
Albumin: 4 g/dL (ref 3.5–5.0)
Alkaline Phosphatase: 54 U/L (ref 38–126)
Bilirubin, Direct: 0.1 mg/dL (ref 0.0–0.2)
Indirect Bilirubin: 0.7 mg/dL (ref 0.3–0.9)
Total Bilirubin: 0.8 mg/dL (ref 0.3–1.2)
Total Protein: 6.9 g/dL (ref 6.5–8.1)

## 2022-10-18 LAB — LACTIC ACID, PLASMA
Lactic Acid, Venous: 2.9 mmol/L (ref 0.5–1.9)
Lactic Acid, Venous: 1.3 mmol/L (ref 0.5–1.9)

## 2022-10-18 LAB — BASIC METABOLIC PANEL
Anion gap: 15 (ref 5–15)
BUN: 16 mg/dL (ref 8–23)
CO2: 23 mmol/L (ref 22–32)
Calcium: 9 mg/dL (ref 8.9–10.3)
Chloride: 100 mmol/L (ref 98–111)
Creatinine, Ser: 1.02 mg/dL (ref 0.61–1.24)
GFR, Estimated: >60 mL/min (ref >60)
Glucose, Bld: 128 mg/dL — ABNORMAL HIGH (ref 70–99)
Potassium: 3.6 mmol/L (ref 3.5–5.1)
Sodium: 138 mmol/L (ref 135–145)

## 2022-10-18 LAB — TROPONIN I (HIGH SENSITIVITY)
Troponin I (High Sensitivity): 16 ng/L (ref <18)
Troponin I (High Sensitivity): 13 ng/L (ref <18)

## 2022-10-18 LAB — RESP PANEL BY RT-PCR (RSV, FLU A&B, COVID)  RVPGX2
Influenza A by PCR: NEGATIVE
Influenza B by PCR: NEGATIVE
Resp Syncytial Virus by PCR: NEGATIVE
SARS Coronavirus 2 by RT PCR: NEGATIVE

## 2022-10-18 LAB — MAGNESIUM: Magnesium: 1.9 mg/dL (ref 1.7–2.4)

## 2022-10-18 LAB — CK: Total CK: 52 U/L (ref 49–397)

## 2022-10-18 LAB — CBG MONITORING, ED: Glucose-Capillary: 141 mg/dL — ABNORMAL HIGH (ref 70–99)

## 2022-10-18 MED ORDER — DIAZEPAM 5 MG/ML IJ SOLN
5.0000 mg | Freq: Once | INTRAMUSCULAR | Status: AC
  Administered 2022-10-18: 5 mg via INTRAVENOUS
  Filled 2022-10-18: qty 2

## 2022-10-18 MED ORDER — HYDROCODONE-ACETAMINOPHEN 5-325 MG PO TABS: 1.0000 | ORAL_TABLET | Freq: Four times a day (QID) | ORAL | 0 refills | Status: AC | PRN

## 2022-10-18 MED ORDER — CLINDAMYCIN HCL 300 MG PO CAPS: 300.0000 mg | ORAL_CAPSULE | Freq: Three times a day (TID) | ORAL | 0 refills | Status: AC

## 2022-10-18 MED ORDER — CYCLOBENZAPRINE HCL 10 MG PO TABS
10.0000 mg | ORAL_TABLET | Freq: Once | ORAL | Status: AC
  Administered 2022-10-18: 10 mg via ORAL
  Filled 2022-10-18: qty 1

## 2022-10-18 MED ORDER — SODIUM CHLORIDE 0.9 % IV BOLUS
1000.0000 mL | Freq: Once | INTRAVENOUS | Status: AC
  Administered 2022-10-18: 1000 mL via INTRAVENOUS

## 2022-10-18 MED ORDER — HYDROCODONE-ACETAMINOPHEN 5-325 MG PO TABS
1.0000 | ORAL_TABLET | Freq: Once | ORAL | Status: AC
  Administered 2022-10-18: 1 via ORAL
  Filled 2022-10-18: qty 1

## 2022-10-18 MED ORDER — CYCLOBENZAPRINE HCL 10 MG PO TABS: 10.0000 mg | ORAL_TABLET | Freq: Three times a day (TID) | ORAL | 0 refills | Status: DC | PRN

## 2022-10-18 NOTE — ED Notes (Signed)
Fuller Plan, MD, made aware of lactic 2.9

## 2022-10-18 NOTE — ED Provider Notes (Signed)
[facility] Provider Note    Event Date/Time   First MD Initiated Contact with Patient 10/18/22 1225     (approximate)   History   Fever   HPI  Nathan Dean is a 67 y.o. male  with history of neuropathy, T2DM, hydrocephalus s/p VP shunt, HTN presenting to the ER for evaluation of foot cramping and fever.  Last night, patient had onset of severe foot cramps.  Tried methocarbamol and Norco without any relief.  He was also noted to have a fever of 101.  No cough, congestion.  Does have a history of a VP shunt.  Denies new headache, balance issues, nausea, vomiting.  No abdominal pain, diarrhea, dysuria.  Patient did note that he has not been taking magnesium that he was previously prescribed, no other recent medication changes.     Physical Exam   Triage Vital Signs: ED Triage Vitals  Enc Vitals Group     BP 10/18/22 1148 111/64     Pulse Rate 10/18/22 1148 81     Resp 10/18/22 1148 18     Temp 10/18/22 1148 97.7 F (36.5 C)     Temp src --      SpO2 10/18/22 1148 95 %     Weight 10/18/22 1149 (!) 330 lb (149.7 kg)     Height 10/18/22 1149 6\' 5"  (1.956 m)     Head Circumference --      Peak Flow --      Pain Score 10/18/22 1148 8     Pain Loc --      Pain Edu? --      Excl. in GC? --     Most recent vital signs: Vitals:   10/18/22 1257 10/18/22 1307  BP:  (!) 141/74  Pulse:  74  Resp:  18  Temp: 97.9 F (36.6 C)   SpO2:  94%     General: Awake, interactive  Head:  Sutures in place over the top of the head at the location of recent skin cancer removal.  VP shunt in place without appreciable open areas of skin. CV:  Regular rate, good peripheral perfusion.  Resp:  Lungs clear, unlabored respirations.  Abd:  Soft, nondistended.  Neuro:  Symmetric facial movement, fluid speech, 5 out of 5 strength in bilateral upper and lower extremities, normal finger-to-nose testing MSK:  Frequent episodes of finger curling and cramping of the left  foot during which patient is quite uncomfortable, full range of motion of bilateral lower extremities.  There is some mild swelling of the left lower extremity compared to the contralateral side.  There is some erythema without significant associated warmth or tenderness over the left lower extremity   ED Results / Procedures / Treatments   Labs (all labs ordered are listed, but only abnormal results are displayed) Labs Reviewed  CBC - Abnormal; Notable for the following components:      Result Value   Hemoglobin 12.8 (*)    RDW 15.9 (*)    All other components within normal limits  BASIC METABOLIC PANEL - Abnormal; Notable for the following components:   Glucose, Bld 128 (*)    All other components within normal limits  LACTIC ACID, PLASMA - Abnormal; Notable for the following components:   Lactic Acid, Venous 2.9 (*)    All other components within normal limits  CBG MONITORING, ED - Abnormal; Notable for the following components:   Glucose-Capillary 141 (*)    All other components within  normal limits  RESP PANEL BY RT-PCR (RSV, FLU A&B, COVID)  RVPGX2  LACTIC ACID, PLASMA  HEPATIC FUNCTION PANEL  MAGNESIUM  CK  TROPONIN I (HIGH SENSITIVITY)  TROPONIN I (HIGH SENSITIVITY)     EKG EKG independently reviewed interpreted by myself (ER attending) demonstrates:  EKG demonstrates sinus rhythm at a rate of 86, PR 182, QRS 88, QTc 490  RADIOLOGY Imaging independently reviewed and interpreted by myself demonstrates:  Lower extremity ultrasound without evidence of DVT.   Chest x-Maevyn Riordan without evidence of pneumonia.  PROCEDURES:  Critical Care performed: No  Procedures   MEDICATIONS ORDERED IN ED: Medications  diazepam (VALIUM) injection 5 mg (5 mg Intravenous Given 10/18/22 1250)  HYDROcodone-acetaminophen (NORCO/VICODIN) 5-325 MG per tablet 1 tablet (1 tablet Oral Given 10/18/22 1324)  sodium chloride 0.9 % bolus 1,000 mL (1,000 mLs Intravenous New Bag/Given 10/18/22 1444)   cyclobenzaprine (FLEXERIL) tablet 10 mg (10 mg Oral Given 10/18/22 1441)     IMPRESSION / MDM / ASSESSMENT AND PLAN / ED COURSE  I reviewed the triage vital signs and the nursing notes.  Differential diagnosis includes, but is not limited to, viral illness, cellulitis, DVT, lower suspicion for shunt infection in the absence of any neurologic symptoms  Patient's presentation is most consistent with acute presentation with potential threat to life or bodily function.  67 year old male presenting to the emergency department for evaluation of foot cramping and fever.  Unclear source for fever.  Labs sent from triage.  Will add on a chest x-Leanne Sisler.  I am concerned about possible DVT of the left lower extremity given increased swelling compared to contralateral side.  Ultrasound ordered.  Consideration for possible early cellulitis of the left lower extremity as the source of patient's fever.  Lab work overall reassuring with normal white blood cell count, mild Niemi actually improved from prior.  CMP without severe derangement.  Negative troponin, COVID, flu testing.  Magnesium within normal limits.  Initial lactate was elevated at 2.9, but improved to 1.3 on repeat.  Patient was reevaluated.  I did discuss admission for further evaluation given his suspected cellulitis, fever, and initial elevated lactate.  However, patient reports that he strongly prefers discharge home.  No tachycardia, leukocytosis, patient does not meet SIRS criteria.  With this, will discharge patient with very strict return precautions.  Will DC with prescription for clindamycin given multiple allergies as well as multimodal pain control.      FINAL CLINICAL IMPRESSION(S) / ED DIAGNOSES   Final diagnoses:  Cellulitis of left lower extremity  Leg muscle spasm     Rx / DC Orders   ED Discharge Orders          Ordered    cyclobenzaprine (FLEXERIL) 10 MG tablet  3 times daily PRN        10/18/22 1556     HYDROcodone-acetaminophen (NORCO) 5-325 MG tablet  Every 6 hours PRN        10/18/22 1556    clindamycin (CLEOCIN) 300 MG capsule  3 times daily        10/18/22 1556             Note:  This document was prepared using Dragon voice recognition software and may include unintentional dictation errors.   Trinna Post, MD 10/18/22 [id number]

## 2022-10-18 NOTE — Discharge Instructions (Addendum)
You were seen in the emergency department today for evaluation of your fever and leg cramps.  It does appear that you likely have a skin infection on your leg.  I sent a prescription for an antibiotic to your pharmacy.  I have sent a muscle relaxer and a short course of narcotic medicine to your pharmacy.  These can make you drowsy, do not drive or operate machinery when taking this.  We did discuss admitting you to the hospital for further evaluation, but you did prefer to go home.  However, if you have worsening symptoms, or or failing to improve in a couple days, please return to the ER for further evaluation.

## 2022-10-18 NOTE — ED Triage Notes (Addendum)
Pt to ED for left foot cramps started last night and fever started last night. Fever treated with tylenol. Reports cramps to foot radiate to leg. Recent slip and fall in shower.  Pt diaphoretic on arrival

## 2022-10-20 ENCOUNTER — Ambulatory Visit: Payer: Medicare HMO | Admitting: Student in an Organized Health Care Education/Training Program

## 2022-10-27 ENCOUNTER — Other Ambulatory Visit: Payer: Self-pay | Admitting: Psychiatry

## 2022-11-09 ENCOUNTER — Emergency Department: Payer: Medicare HMO

## 2022-11-09 ENCOUNTER — Inpatient Hospital Stay
Admission: EM | Admit: 2022-11-09 | Discharge: 2022-11-11 | DRG: 542 | Disposition: A | Discharge status: Home Health | Payer: Medicare HMO | Attending: Internal Medicine | Admitting: Internal Medicine

## 2022-11-09 ENCOUNTER — Observation Stay (HOSPITAL_COMMUNITY)
Admit: 2022-11-09 | Discharge: 2022-11-09 | Disposition: A | Discharge status: Home / Self Care | Payer: Medicare HMO | Attending: Family Medicine | Admitting: Family Medicine

## 2022-11-09 ENCOUNTER — Other Ambulatory Visit: Payer: Self-pay

## 2022-11-09 DIAGNOSIS — I5033 Acute on chronic diastolic (congestive) heart failure: Secondary | ICD-10-CM | POA: Diagnosis present

## 2022-11-09 DIAGNOSIS — Z982 Presence of cerebrospinal fluid drainage device: Secondary | ICD-10-CM

## 2022-11-09 DIAGNOSIS — W19XXXA Unspecified fall, initial encounter: Secondary | ICD-10-CM | POA: Diagnosis not present

## 2022-11-09 DIAGNOSIS — S32020A Wedge compression fracture of second lumbar vertebra, initial encounter for closed fracture: Secondary | ICD-10-CM | POA: Diagnosis present

## 2022-11-09 DIAGNOSIS — Z87891 Personal history of nicotine dependence: Secondary | ICD-10-CM

## 2022-11-09 DIAGNOSIS — M4856XA Collapsed vertebra, not elsewhere classified, lumbar region, initial encounter for fracture: Secondary | ICD-10-CM | POA: Diagnosis not present

## 2022-11-09 DIAGNOSIS — Z7984 Long term (current) use of oral hypoglycemic drugs: Secondary | ICD-10-CM

## 2022-11-09 DIAGNOSIS — Z79899 Other long term (current) drug therapy: Secondary | ICD-10-CM

## 2022-11-09 DIAGNOSIS — Z880 Allergy status to penicillin: Secondary | ICD-10-CM

## 2022-11-09 DIAGNOSIS — Z881 Allergy status to other antibiotic agents status: Secondary | ICD-10-CM

## 2022-11-09 DIAGNOSIS — I2781 Cor pulmonale (chronic): Secondary | ICD-10-CM | POA: Diagnosis present

## 2022-11-09 DIAGNOSIS — Z794 Long term (current) use of insulin: Secondary | ICD-10-CM

## 2022-11-09 DIAGNOSIS — E1169 Type 2 diabetes mellitus with other specified complication: Secondary | ICD-10-CM | POA: Diagnosis not present

## 2022-11-09 DIAGNOSIS — W010XXA Fall on same level from slipping, tripping and stumbling without subsequent striking against object, initial encounter: Secondary | ICD-10-CM | POA: Diagnosis present

## 2022-11-09 DIAGNOSIS — J9601 Acute respiratory failure with hypoxia: Secondary | ICD-10-CM | POA: Diagnosis not present

## 2022-11-09 DIAGNOSIS — Z7982 Long term (current) use of aspirin: Secondary | ICD-10-CM

## 2022-11-09 DIAGNOSIS — R0902 Hypoxemia: Secondary | ICD-10-CM

## 2022-11-09 DIAGNOSIS — Z96653 Presence of artificial knee joint, bilateral: Secondary | ICD-10-CM | POA: Diagnosis present

## 2022-11-09 DIAGNOSIS — Z85828 Personal history of other malignant neoplasm of skin: Secondary | ICD-10-CM

## 2022-11-09 DIAGNOSIS — Z809 Family history of malignant neoplasm, unspecified: Secondary | ICD-10-CM

## 2022-11-09 DIAGNOSIS — G9341 Metabolic encephalopathy: Secondary | ICD-10-CM | POA: Diagnosis present

## 2022-11-09 DIAGNOSIS — I110 Hypertensive heart disease with heart failure: Secondary | ICD-10-CM | POA: Diagnosis present

## 2022-11-09 DIAGNOSIS — S32029A Unspecified fracture of second lumbar vertebra, initial encounter for closed fracture: Secondary | ICD-10-CM

## 2022-11-09 DIAGNOSIS — Z6837 Body mass index (BMI) 37.0-37.9, adult: Secondary | ICD-10-CM

## 2022-11-09 DIAGNOSIS — E1165 Type 2 diabetes mellitus with hyperglycemia: Secondary | ICD-10-CM | POA: Diagnosis present

## 2022-11-09 DIAGNOSIS — G4733 Obstructive sleep apnea (adult) (pediatric): Secondary | ICD-10-CM | POA: Diagnosis present

## 2022-11-09 DIAGNOSIS — Z882 Allergy status to sulfonamides status: Secondary | ICD-10-CM

## 2022-11-09 DIAGNOSIS — I5030 Unspecified diastolic (congestive) heart failure: Secondary | ICD-10-CM

## 2022-11-09 DIAGNOSIS — Y92009 Unspecified place in unspecified non-institutional (private) residence as the place of occurrence of the external cause: Secondary | ICD-10-CM

## 2022-11-09 DIAGNOSIS — I10 Essential (primary) hypertension: Secondary | ICD-10-CM

## 2022-11-09 DIAGNOSIS — Z8669 Personal history of other diseases of the nervous system and sense organs: Secondary | ICD-10-CM

## 2022-11-09 DIAGNOSIS — I5031 Acute diastolic (congestive) heart failure: Secondary | ICD-10-CM | POA: Diagnosis not present

## 2022-11-09 DIAGNOSIS — Z85528 Personal history of other malignant neoplasm of kidney: Secondary | ICD-10-CM

## 2022-11-09 DIAGNOSIS — K219 Gastro-esophageal reflux disease without esophagitis: Secondary | ICD-10-CM | POA: Diagnosis present

## 2022-11-09 DIAGNOSIS — M21371 Foot drop, right foot: Secondary | ICD-10-CM | POA: Diagnosis present

## 2022-11-09 DIAGNOSIS — G4730 Sleep apnea, unspecified: Secondary | ICD-10-CM | POA: Diagnosis present

## 2022-11-09 DIAGNOSIS — Z841 Family history of disorders of kidney and ureter: Secondary | ICD-10-CM

## 2022-11-09 DIAGNOSIS — Z888 Allergy status to other drugs, medicaments and biological substances status: Secondary | ICD-10-CM

## 2022-11-09 DIAGNOSIS — E785 Hyperlipidemia, unspecified: Secondary | ICD-10-CM | POA: Diagnosis present

## 2022-11-09 DIAGNOSIS — E1140 Type 2 diabetes mellitus with diabetic neuropathy, unspecified: Secondary | ICD-10-CM | POA: Diagnosis present

## 2022-11-09 DIAGNOSIS — I959 Hypotension, unspecified: Secondary | ICD-10-CM | POA: Diagnosis present

## 2022-11-09 DIAGNOSIS — Z7985 Long-term (current) use of injectable non-insulin antidiabetic drugs: Secondary | ICD-10-CM

## 2022-11-09 LAB — COMPREHENSIVE METABOLIC PANEL
ALT: 23 U/L (ref 0–44)
AST: 30 U/L (ref 15–41)
Albumin: 3.7 g/dL (ref 3.5–5.0)
Alkaline Phosphatase: 50 U/L (ref 38–126)
Anion gap: 8 (ref 5–15)
BUN: 19 mg/dL (ref 8–23)
CO2: 26 mmol/L (ref 22–32)
Calcium: 9 mg/dL (ref 8.9–10.3)
Chloride: 102 mmol/L (ref 98–111)
Creatinine, Ser: 0.85 mg/dL (ref 0.61–1.24)
GFR, Estimated: >60 mL/min (ref >60)
Glucose, Bld: 184 mg/dL — ABNORMAL HIGH (ref 70–99)
Potassium: 3.9 mmol/L (ref 3.5–5.1)
Sodium: 136 mmol/L (ref 135–145)
Total Bilirubin: 0.7 mg/dL (ref 0.3–1.2)
Total Protein: 6.7 g/dL (ref 6.5–8.1)

## 2022-11-09 LAB — TROPONIN I (HIGH SENSITIVITY)
Troponin I (High Sensitivity): 12 ng/L (ref <18)
Troponin I (High Sensitivity): 10 ng/L (ref <18)

## 2022-11-09 LAB — CBC WITH DIFFERENTIAL/PLATELET
Abs Immature Granulocytes: 0.04 10*3/uL (ref 0.00–0.07)
Basophils Absolute: 0 10*3/uL (ref 0.0–0.1)
Basophils Relative: 0 %
Eosinophils Absolute: 0.3 10*3/uL (ref 0.0–0.5)
Eosinophils Relative: 4 %
HCT: 39.3 % (ref 39.0–52.0)
Hemoglobin: 11.9 g/dL — ABNORMAL LOW (ref 13.0–17.0)
Immature Granulocytes: 1 %
Lymphocytes Relative: 15 %
Lymphs Abs: 1.1 10*3/uL (ref 0.7–4.0)
MCH: 26.5 pg (ref 26.0–34.0)
MCHC: 30.3 g/dL (ref 30.0–36.0)
MCV: 87.5 fL (ref 80.0–100.0)
Monocytes Absolute: 0.6 10*3/uL (ref 0.1–1.0)
Monocytes Relative: 8 %
Neutro Abs: 5.4 10*3/uL (ref 1.7–7.7)
Neutrophils Relative %: 72 %
Platelets: 180 10*3/uL (ref 150–400)
RBC: 4.49 MIL/uL (ref 4.22–5.81)
RDW: 16.5 % — ABNORMAL HIGH (ref 11.5–15.5)
WBC: 7.4 10*3/uL (ref 4.0–10.5)
nRBC: 0 % (ref 0.0–0.2)

## 2022-11-09 LAB — GLUCOSE, CAPILLARY: Glucose-Capillary: 200 mg/dL — ABNORMAL HIGH (ref 70–99)

## 2022-11-09 LAB — BRAIN NATRIURETIC PEPTIDE: B Natriuretic Peptide: 115.2 pg/mL — ABNORMAL HIGH (ref 0.0–100.0)

## 2022-11-09 LAB — ECHOCARDIOGRAM COMPLETE

## 2022-11-09 MED ORDER — HYDROMORPHONE HCL 1 MG/ML IJ SOLN
4.0000 mg | INTRAMUSCULAR | Status: DC | PRN
  Administered 2022-11-09 – 2022-11-10 (×5): 4 mg via INTRAVENOUS
  Filled 2022-11-09 (×5): qty 4

## 2022-11-09 MED ORDER — CARVEDILOL 12.5 MG PO TABS
12.5000 mg | ORAL_TABLET | Freq: Two times a day (BID) | ORAL | Status: DC
  Administered 2022-11-09 – 2022-11-11 (×3): 12.5 mg via ORAL
  Filled 2022-11-09: qty 2
  Filled 2022-11-09 (×3): qty 1

## 2022-11-09 MED ORDER — FUROSEMIDE 10 MG/ML IJ SOLN
40.0000 mg | Freq: Once | INTRAMUSCULAR | Status: AC
  Administered 2022-11-09: 40 mg via INTRAVENOUS
  Filled 2022-11-09: qty 4

## 2022-11-09 MED ORDER — LOSARTAN POTASSIUM 25 MG PO TABS
25.0000 mg | ORAL_TABLET | Freq: Every day | ORAL | Status: DC
  Administered 2022-11-09 – 2022-11-11 (×2): 25 mg via ORAL
  Filled 2022-11-09 (×2): qty 1

## 2022-11-09 MED ORDER — ONDANSETRON HCL 4 MG PO TABS: 4.0000 mg | ORAL_TABLET | Freq: Four times a day (QID) | ORAL | Status: DC | PRN

## 2022-11-09 MED ORDER — ALBUTEROL SULFATE (2.5 MG/3ML) 0.083% IN NEBU: 3.0000 mL | INHALATION_SOLUTION | RESPIRATORY_TRACT | Status: DC | PRN

## 2022-11-09 MED ORDER — DULOXETINE HCL 30 MG PO CPEP
60.0000 mg | ORAL_CAPSULE | Freq: Every day | ORAL | Status: DC
  Administered 2022-11-09 – 2022-11-11 (×2): 60 mg via ORAL
  Filled 2022-11-09 (×2): qty 2

## 2022-11-09 MED ORDER — HYDROMORPHONE HCL 1 MG/ML IJ SOLN
1.0000 mg | Freq: Once | INTRAMUSCULAR | Status: AC
  Administered 2022-11-09: 1 mg via INTRAVENOUS
  Filled 2022-11-09: qty 1

## 2022-11-09 MED ORDER — TRAZODONE HCL 50 MG PO TABS
50.0000 mg | ORAL_TABLET | Freq: Every day | ORAL | Status: DC
  Administered 2022-11-09: 50 mg via ORAL
  Filled 2022-11-09: qty 1

## 2022-11-09 MED ORDER — ENOXAPARIN SODIUM 40 MG/0.4ML IJ SOSY: 40.0000 mg | PREFILLED_SYRINGE | INTRAMUSCULAR | Status: DC

## 2022-11-09 MED ORDER — FUROSEMIDE 40 MG PO TABS
40.0000 mg | ORAL_TABLET | Freq: Every day | ORAL | Status: DC
  Administered 2022-11-09 – 2022-11-11 (×2): 40 mg via ORAL
  Filled 2022-11-09 (×2): qty 1

## 2022-11-09 MED ORDER — DONEPEZIL HCL 5 MG PO TABS
10.0000 mg | ORAL_TABLET | Freq: Every day | ORAL | Status: DC
  Administered 2022-11-09 – 2022-11-10 (×2): 10 mg via ORAL
  Filled 2022-11-09 (×2): qty 2

## 2022-11-09 MED ORDER — GABAPENTIN 300 MG PO CAPS
900.0000 mg | ORAL_CAPSULE | Freq: Three times a day (TID) | ORAL | Status: DC
  Administered 2022-11-09 – 2022-11-11 (×5): 900 mg via ORAL
  Filled 2022-11-09 (×5): qty 3

## 2022-11-09 MED ORDER — ENOXAPARIN SODIUM 80 MG/0.8ML IJ SOSY
75.0000 mg | PREFILLED_SYRINGE | INTRAMUSCULAR | Status: DC
  Administered 2022-11-09 – 2022-11-11 (×3): 75 mg via SUBCUTANEOUS
  Filled 2022-11-09 (×3): qty 0.75

## 2022-11-09 MED ORDER — PANTOPRAZOLE SODIUM 40 MG PO TBEC
40.0000 mg | DELAYED_RELEASE_TABLET | Freq: Every day | ORAL | Status: DC
  Administered 2022-11-09 – 2022-11-11 (×2): 40 mg via ORAL
  Filled 2022-11-09 (×2): qty 1

## 2022-11-09 MED ORDER — SODIUM CHLORIDE 0.9 % IV SOLN: 250.0000 mL | INTRAVENOUS | Status: DC | PRN

## 2022-11-09 MED ORDER — INSULIN GLARGINE-YFGN 100 UNIT/ML ~~LOC~~ SOLN
20.0000 [IU] | Freq: Every day | SUBCUTANEOUS | Status: DC
  Administered 2022-11-09 – 2022-11-10 (×2): 20 [IU] via SUBCUTANEOUS
  Filled 2022-11-09 (×2): qty 0.2

## 2022-11-09 MED ORDER — ASPIRIN 81 MG PO TBEC
81.0000 mg | DELAYED_RELEASE_TABLET | Freq: Every day | ORAL | Status: DC
  Administered 2022-11-09 – 2022-11-11 (×2): 81 mg via ORAL
  Filled 2022-11-09 (×2): qty 1

## 2022-11-09 MED ORDER — LABETALOL HCL 5 MG/ML IV SOLN
10.0000 mg | Freq: Once | INTRAVENOUS | Status: AC
  Administered 2022-11-09: 10 mg via INTRAVENOUS
  Filled 2022-11-09: qty 4

## 2022-11-09 MED ORDER — ONDANSETRON HCL 4 MG/2ML IJ SOLN: 4.0000 mg | Freq: Four times a day (QID) | INTRAMUSCULAR | Status: DC | PRN

## 2022-11-09 MED ORDER — SODIUM CHLORIDE 0.9% FLUSH: 3.0000 mL | INTRAVENOUS | Status: DC | PRN

## 2022-11-09 MED ORDER — SODIUM CHLORIDE 0.9% FLUSH
3.0000 mL | Freq: Two times a day (BID) | INTRAVENOUS | Status: DC
  Administered 2022-11-09 – 2022-11-10 (×4): 3 mL via INTRAVENOUS

## 2022-11-09 MED ORDER — HYDROMORPHONE HCL 1 MG/ML IJ SOLN
1.0000 mg | INTRAMUSCULAR | Status: DC | PRN
  Filled 2022-11-09: qty 1

## 2022-11-09 MED ORDER — HYDROMORPHONE HCL 2 MG/ML IJ SOLN: 4.0000 mg | INTRAMUSCULAR | Status: DC | PRN

## 2022-11-09 NOTE — Consult Note (Signed)
Consult requested by:  Dr. Sidney Ace  Consult requested for:  L2 fracture  Primary Physician:  Lorrine Kin, MD  History of Present Illness: 11/09/2022 [name] is here today with a chief complaint of back pain after a mechanical fall.  He has baseline neuropathy, which is unchanged.  He has no new symptoms with the pain.  He was having some difficulty getting out of bed when he subsequently fell.  He did not lose consciousness.  He presented to the emergency department for evaluation where he was noted to have low oxygenation and was started on oxygen which is not his norm.  The symptoms are causing a significant impact on the patient's life.   I have utilized the care everywhere function in epic to review the outside records available from external health systems.  Review of Systems:  A 10 point review of systems is negative, except for the pertinent positives and negatives detailed in the HPI.  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    Diabetes (HCC)    GERD (gastroesophageal reflux disease)    Hyperlipidemia    Hypertension    Obesity    Sleep apnea     Past Surgical History: Past Surgical History:  Procedure Laterality Date   BACK SURGERY     in the 78's -herniated disc   BACK SURGERY     L4 bone spur 1990   CARPAL TUNNEL RELEASE Right 12/28/2016   Procedure: RIGHT ULNAR AND MEDIAN NEUROPLASTY AT WRIST;  Surgeon: Mack Hook, MD;  Location: [facility];  Service: Orthopedics;  Laterality: Right;   CARPAL TUNNEL RELEASE Left    25 years ago   CATARACT EXTRACTION W/ INTRAOCULAR LENS IMPLANT Bilateral    REPLACEMENT TOTAL KNEE BILATERAL  2014   left 2012 rt 2014   SHOULDER OPEN ROTATOR CUFF REPAIR Right 08/07/2019   Procedure: ROTATOR CUFF REPAIR SHOULDER OPEN;  Surgeon: Juanell Fairly, MD;  Location: [facility];  Service:  Orthopedics;  Laterality: Right;   SKIN GRAFT Left 06/19/2022   left side of neck for skin cancer on top of head   TONSILLECTOMY      Allergies: Allergies as of 11/09/2022 - Review Complete 11/09/2022  Allergen Reaction Noted   Penicillins Anaphylaxis, Rash, and Other (See Comments) 12/23/2014   Succinylcholine chloride Anaphylaxis and Rash 01/02/2015   Semaglutide Diarrhea 06/04/2022   Keflex [cephalexin] Rash 12/23/2014   Sulfa antibiotics Rash and Other (See Comments) 12/23/2014    Medications: Current Meds  Medication Sig   albuterol (VENTOLIN HFA) 108 (90 Base) MCG/ACT inhaler Inhale 2 puffs into the lungs every 4 (four) hours as needed.   aspirin EC 81 MG tablet Take 81 mg by mouth daily.   buPROPion (WELLBUTRIN XL) 300 MG 24 hr tablet Take 450 mg by mouth daily.   carvedilol (COREG) 12.5 MG tablet Take 1 tablet (12.5 mg total) by mouth 2 (two) times daily with a meal.   cholecalciferol (VITAMIN D3) 25 MCG (1000 UNIT) tablet Take 2,000 Units by mouth daily.   cyanocobalamin (VITAMIN B12) 1000 MCG tablet Take 5,000 mcg by mouth daily.   cyclobenzaprine (FLEXERIL) 10 MG tablet Take 1 tablet (10 mg total) by mouth 3 (three) times daily as needed for muscle spasms.   donepezil (ARICEPT) 10 MG tablet Take 10 mg by mouth at bedtime.   DULoxetine (CYMBALTA) 30 MG capsule Take 1 capsule (30 mg  total) by mouth daily. Take total of 90 mg daily. Take along with 60 mg cap   gabapentin (NEURONTIN) 300 MG capsule Take 3 capsules (900 mg total) by mouth 3 (three) times daily.   insulin glargine (LANTUS) 100 UNIT/ML injection Inject 30 Units into the skin daily.   insulin lispro (HUMALOG) 100 UNIT/ML injection Inject 18 Units into the skin in the morning and at bedtime. 18 in am. 8 in pm   ketoconazole (NIZORAL) 2 % cream Apply 1 Application topically 2 (two) times daily.   loratadine (CLARITIN) 10 MG tablet Take 10 mg by mouth daily as needed.   losartan (COZAAR) 100 MG tablet Take 1 tablet  (100 mg total) by mouth daily.   magnesium oxide (MAG-OX) 400 (240 Mg) MG tablet Take 500 mg by mouth daily.   metFORMIN (GLUCOPHAGE) 500 MG tablet Take 1,000 mg by mouth 2 (two) times daily with a meal.   methocarbamol (ROBAXIN) 750 MG tablet Take 750 mg by mouth 4 (four) times daily.   mometasone (ELOCON) 0.1 % cream Apply 1 Application topically daily.   omeprazole (PRILOSEC) 20 MG capsule Take 20 mg by mouth daily.    ondansetron (ZOFRAN-ODT) 4 MG disintegrating tablet Take 8 mg by mouth every 8 (eight) hours as needed for nausea.   rosuvastatin (CRESTOR) 20 MG tablet Take 20 mg by mouth daily.   SEMGLEE, YFGN, 100 UNIT/ML Pen Inject 30 Units into the skin at bedtime.   senna-docusate (SENOKOT-S) 8.6-50 MG tablet Take 2 tablets by mouth daily.   tirzepatide [facility]) 2.5 MG/0.5ML Pen Inject 2.5 mg into the skin once a week.   traZODone (DESYREL) 50 MG tablet Take 1 tablet by mouth at bedtime.    Social History: Social History   Tobacco Use   Smoking status: Former    Packs/day: 2.00    Years: 20.00    Additional pack years: 0.00    Total pack years: 40.00    Types: Cigarettes    Quit date: 06/28/1996    Years since quitting: 26.3   Smokeless tobacco: Never  Vaping Use   Vaping Use: Never used  Substance Use Topics   Alcohol use: Yes    Comment: occasionally   Drug use: No    Family Medical History: Family History  Problem Relation Age of Onset   Ovarian cancer Mother    Kidney failure Father    Cancer Brother     Physical Examination: Vitals:   11/09/22 1214 11/09/22 1249  BP:  132/82  Pulse:  72  Resp:  15  Temp: (!) 97.5 F (36.4 C)   SpO2:  92%    General: Patient is in no apparent distress. Attention to examination is appropriate.  Neck:   Supple.  Full range of motion.  Respiratory: Patient is breathing without any difficulty.   NEUROLOGICAL:     Awake, alert, oriented to person, place, and time.  Speech is clear and fluent.  Cranial Nerves:  Pupils equal round and reactive to light.  Facial tone is symmetric.  Facial sensation is symmetric. Shoulder shrug is symmetric. Tongue protrusion is midline.  There is no pronator drift.  Strength:  Moves upper extremities well.  Side Iliopsoas Quads Hamstring PF DF EHL  R 5 5 5 5 5 5   L 5 5 5 5 5 5     Bilateral upper and lower extremity sensation is symmetric to light touch.    No evidence of dysmetria noted.  Gait is untested.  Medical Decision Making  Imaging: CT L spine 11/09/2022 IMPRESSION: 1. Acute compression fracture of the L2 vertebral body resulting in up to approximately 20% loss of vertebral body height. No bony retropulsion or evidence of extension into the posterior elements. 2. No other acute fracture or traumatic malalignment of the thoracic or lumbar spine.     Electronically Signed   By: Lesia Hausen M.D.   On: 11/09/2022 08:05  I have personally reviewed the images and agree with the above interpretation.  Assessment and Plan: Nathan Dean is a pleasant 67 y.o. male with L2 compression fracture.  There is no evidence of a coronally oriented split, but there is no displacement of the fracture.  I have recommended an LSO brace.  He can get out of bed with his brace.  He should start working with physical and Occupational Therapy once his brace is present.  Will see him back in the clinic in follow-up.    I have communicated my recommendations to the requesting physician and coordinated care to facilitate these recommendations.     Nathan Sellinger K. Myer Haff MD, [facility]

## 2022-11-09 NOTE — Assessment & Plan Note (Signed)
Cont statin

## 2022-11-09 NOTE — Assessment & Plan Note (Signed)
Noted L2 vertebral compression fracture status post fall today Dr. Marcell Barlow with neurosurgery made aware.  As per Dr. Esmond Harps brace PT OT evaluation Follow Fall precautions

## 2022-11-09 NOTE — Progress Notes (Signed)
Orthopedic Tech Progress Note Patient Details:  Nathan Dean 03-03-1956 [id number]  Called in order to [employer] for a ASPEN LUMBAR BRACE (LSO)  Patient ID: [name], male   DOB: 05/21/1955, 67 y.o.   MRN: [id number]  Donald Pore 11/09/2022, 6:54 PM

## 2022-11-09 NOTE — Progress Notes (Signed)
OT Cancellation Note  Patient Details Name: Madeline Bebout MRN: [id number] DOB: Nov 25, 1955   Cancelled Treatment:    Reason Eval/Treat Not Completed: Other (comment). OT order received and chart reviewed. Per neurosurgeon recommendation LSO brace has been orders for pt when OOB secondary to new L2 compression fx. OT just checking with RN and brace has not arrived to room. OT will hold until brace arrives for him to actively participate.   Jackquline Denmark, MS, OTR/L , CBIS ascom [phone]  11/09/22, 1:24 PM

## 2022-11-09 NOTE — Assessment & Plan Note (Signed)
Mechanical fall at home w/ noted secondary L2 vertebral fracture  No weakness/dizziness prior to fall Baseline gait instability is a confounding issue Pain control  PT/OT evaluation  Fall precautions  Follow

## 2022-11-09 NOTE — H&P (Signed)
History and Physical    Patient: Nathan Dean ZOX:[id number] DOB: October 30, 1955 DOA: 11/09/2022 DOS: the patient was seen and examined on 11/09/2022 PCP: Lorrine Kin, MD  Patient coming from: Home  Chief Complaint:  Chief Complaint  Patient presents with   Fall   HPI: Nathan Dean is a 67 y.o. male with medical history significant of multiple medical issues including morbid obesity, peripheral neuropathy, type 2 diabetes, history of hydrocephalus status post shunting, history of renal cell cancer status posttreatment, hypertension, OSA, depression, chronic pain presenting with fall, L2 vertebral fracture, hypoxia.  Patient reports attempting to get out of bed earlier in the day.  Has difficulty with ambulation in the setting of neuropathy and foot drop, uses a walker.  Subsequently fell landing on his left side.  No reported head trauma loss consciousness.  No reported weakness dizziness prior to fall.  Has significant pain after the fall.  No chest pain or shortness of breath.  No nausea or vomiting.  Does report having chronic lower extremity swelling.  No reported high salt or NSAID use.  No abdominal pain. Presented to the ER afebrile, hemodynamically stable, initially O2 sats in the mid 80s on room air.  Now on 2 L with O2 sats in the mid 90s.  White count 7.4, hemoglobin 12, platelets 180, troponin within normal limits.  Creatinine 0.9.  Imaging including CT head, CT C-spine, CT chest, CT L-spine grossly stable apart from noted L2 vertebral fracture.  Per Dr. Lexine Baton, case discussed with on-call neurosurgeon Dr. Marcell Barlow who recommended LSO bracing. Review of Systems: As mentioned in the history of present illness. All other systems reviewed and are negative. 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    Diabetes (HCC)    GERD (gastroesophageal reflux disease)    Hyperlipidemia    Hypertension    Obesity     Sleep apnea    Past Surgical History:  Procedure Laterality Date   BACK SURGERY     in the 80's -herniated disc   BACK SURGERY     L4 bone spur 1990   CARPAL TUNNEL RELEASE Right 12/28/2016   Procedure: RIGHT ULNAR AND MEDIAN NEUROPLASTY AT WRIST;  Surgeon: Mack Hook, MD;  Location: [facility];  Service: Orthopedics;  Laterality: Right;   CARPAL TUNNEL RELEASE Left    25 years ago   CATARACT EXTRACTION W/ INTRAOCULAR LENS IMPLANT Bilateral    REPLACEMENT TOTAL KNEE BILATERAL  2014   left 2012 rt 2014   SHOULDER OPEN ROTATOR CUFF REPAIR Right 08/07/2019   Procedure: ROTATOR CUFF REPAIR SHOULDER OPEN;  Surgeon: Juanell Fairly, MD;  Location: [facility];  Service: Orthopedics;  Laterality: Right;   SKIN GRAFT Left 06/19/2022   left side of neck for skin cancer on top of head   TONSILLECTOMY     Social History:  reports that he quit smoking about 26 years ago. His smoking use included cigarettes. He has a 40.00 pack-year smoking history. He has never used smokeless tobacco. He reports current alcohol use. He reports that he does not use drugs.  Allergies  Allergen Reactions   Penicillins Anaphylaxis, Rash and Other (See Comments)   Succinylcholine Chloride Anaphylaxis and Rash   Keflex [Cephalexin] Rash   Sulfa Antibiotics Rash and Other (See Comments)    Family History  Problem Relation Age of Onset   Ovarian cancer Mother    Kidney failure Father    Cancer Brother  Prior to Admission medications   Medication Sig Start Date End Date Taking? Authorizing Provider  albuterol (VENTOLIN HFA) 108 (90 Base) MCG/ACT inhaler Inhale 2 puffs into the lungs every 4 (four) hours as needed. 05/27/22 05/27/23  [provider]  amLODipine (NORVASC) 10 MG tablet Take 10 mg by mouth daily. 05/23/22   [provider]  aspirin EC 81 MG tablet Take 81 mg by mouth daily. 06/04/22   [provider]  buPROPion (WELLBUTRIN XL) 300 MG 24 hr tablet Take 450  mg by mouth daily. 06/05/21   [provider]  carvedilol (COREG) 12.5 MG tablet Take 1 tablet (12.5 mg total) by mouth 2 (two) times daily with a meal. 08/17/19   Lurene Shadow, MD  cholecalciferol (VITAMIN D3) 25 MCG (1000 UNIT) tablet Take 2,000 Units by mouth daily.    [provider]  cyanocobalamin (VITAMIN B12) 1000 MCG tablet Take 5,000 mcg by mouth daily.    [provider]  cyclobenzaprine (FLEXERIL) 10 MG tablet Take 1 tablet (10 mg total) by mouth 3 (three) times daily as needed for muscle spasms. 10/18/22   Trinna Post, MD  donepezil (ARICEPT) 10 MG tablet Take 10 mg by mouth at bedtime. 08/18/22 11/16/22  [provider]  DULoxetine (CYMBALTA) 30 MG capsule Take 1 capsule (30 mg total) by mouth daily. Take total of 90 mg daily. Take along with 60 mg cap 09/28/22 12/27/22  Neysa Hotter, MD  DULoxetine (CYMBALTA) 60 MG capsule Take 60 mg by mouth daily.    [provider]  gabapentin (NEURONTIN) 300 MG capsule Take 3 capsules (900 mg total) by mouth 3 (three) times daily. 10/21/18   Alford Highland, MD  insulin glargine (LANTUS) 100 UNIT/ML injection Inject 30 Units into the skin daily.    [provider]  insulin lispro (HUMALOG) 100 UNIT/ML injection Inject 18 Units into the skin in the morning and at bedtime. 18 in am. 8 in pm    [provider]  loratadine (CLARITIN) 10 MG tablet Take 10 mg by mouth daily as needed. 06/01/21   [provider]  losartan (COZAAR) 100 MG tablet Take 1 tablet (100 mg total) by mouth daily. 05/01/19   Georgina Quint, MD  magnesium oxide (MAG-OX) 400 (240 Mg) MG tablet Take 500 mg by mouth daily.    [provider]  Melatonin 10 MG TABS Take 20 mg by mouth at bedtime as needed (sleep).    [provider]  metFORMIN (GLUCOPHAGE) 500 MG tablet Take 1,000 mg by mouth 2 (two) times daily with a meal.    [provider]  methocarbamol (ROBAXIN) 750 MG tablet Take 750 mg  by mouth 4 (four) times daily.    [provider]  mometasone (ELOCON) 0.1 % cream Apply 1 Application topically daily. 08/13/22   [provider]  omeprazole (PRILOSEC) 20 MG capsule Take 20 mg by mouth daily.     [provider]  ondansetron (ZOFRAN-ODT) 4 MG disintegrating tablet Take 8 mg by mouth every 8 (eight) hours as needed for nausea. 07/05/22   [provider]  rosuvastatin (CRESTOR) 20 MG tablet Take 20 mg by mouth daily.    [provider]  SEMGLEE, YFGN, 100 UNIT/ML Pen Inject 30 Units into the skin at bedtime. 07/01/22   [provider]  senna-docusate (SENOKOT-S) 8.6-50 MG tablet Take 2 tablets by mouth daily. 06/01/21   [provider]  tirzepatide Greggory Keen) 2.5 MG/0.5ML Pen Inject 2.5 mg into the  skin once a week.    [provider]  traZODone (DESYREL) 50 MG tablet Take 1 tablet by mouth at bedtime. 08/13/22 08/13/23  [provider]    Physical Exam: Vitals:   11/09/22 0747 11/09/22 0800 11/09/22 0830 11/09/22 0900  BP: (!) 186/93 (!) 173/79 (!) 173/99 (!) 168/92  Pulse: 82 80 78 76  Resp: 17 14 14 15   Temp:      TempSrc:      SpO2: 94% 98% 93% 97%  Weight:      Height:       Physical Exam Constitutional:      Appearance: He is obese.  HENT:     Head: Normocephalic.     Nose: Nose normal.     Mouth/Throat:     Mouth: Mucous membranes are moist.  Eyes:     Pupils: Pupils are equal, round, and reactive to light.  Cardiovascular:     Rate and Rhythm: Normal rate and regular rhythm.  Pulmonary:     Effort: Pulmonary effort is normal.  Abdominal:     General: Bowel sounds are normal.  Musculoskeletal:     Right lower leg: Edema present.     Left lower leg: Edema present.  Neurological:     General: No focal deficit present.  Psychiatric:        Mood and Affect: Mood normal.     Data Reviewed:  There are no new results to review at this time. DG Chest Portable 1 View CLINICAL  DATA:  67 year old male status post fall.  Pain.  Hypoxia.  EXAM: PORTABLE CHEST 1 VIEW  COMPARISON:  Portable chest 10/18/2022 and earlier.  FINDINGS: Portable AP semi upright view at 0813 hours. Right anterior chest wall shunt catheter redemonstrated. Improved lung volumes. Normal cardiac size and mediastinal contours. Visualized tracheal air column is within normal limits. Allowing for portable technique the lungs are clear. No pneumothorax or pleural effusion. Paucity of bowel gas in the visible abdomen. No acute osseous abnormality identified.  IMPRESSION: Negative portable chest; right chest chronic VP shunt.  Electronically Signed   By: Odessa Fleming M.D.   On: 11/09/2022 08:22 CT Chest Wo Contrast CLINICAL DATA:  Fall  EXAM: CT CHEST WITHOUT CONTRAST  TECHNIQUE: Multidetector CT imaging of the chest was performed following the standard protocol without IV contrast.  RADIATION DOSE REDUCTION: This exam was performed according to the departmental dose-optimization program which includes automated exposure control, adjustment of the mA and/or kV according to patient size and/or use of iterative reconstruction technique.  COMPARISON:  CTA chest 05/23/2022  FINDINGS: Cardiovascular: The heart size is normal. There is no pericardial effusion. Coronary artery calcifications, mitral valve calcifications, and aortic annular calcifications are again noted. The ascending thoracic aorta is mildly aneurysmal measuring up to 4.1 cm.  Mediastinum/Nodes: The imaged thyroid is unremarkable. The esophagus is grossly unremarkable. There is no mediastinal, hilar, or axillary lymphadenopathy.  Lungs/Pleura: The trachea and central airways are patent  The lungs clear, with no focal consolidation or pulmonary edema. There is no pleural effusion or pneumothorax. There is no evidence of traumatic parenchymal injury.  There are no suspicious nodules.  Upper Abdomen: There is  punctate nonobstructing right upper pole renal stone. The imaged portions of the upper abdominal viscera are otherwise unremarkable.  Musculoskeletal: There is no acute rib fracture. There is no acute sternal fracture. The thoracic spine is assessed on the separately dictated CT thoracic spine.  IMPRESSION: 1. Evidence of acute traumatic injury in  the chest. 2. Coronary artery, aortic valve, and mitral annular calcifications. 3. 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 Recommend annual imaging follow-up.  Electronically Signed   By: Lesia Hausen M.D.   On: 11/09/2022 08:14 CT Lumbar Spine Wo Contrast CLINICAL DATA:  Fall  EXAM: CT THORACIC AND LUMBAR SPINE WITHOUT CONTRAST  TECHNIQUE: Multidetector CT imaging of the thoracic and lumbar spine was performed without contrast. Multiplanar CT image reconstructions were also generated.  RADIATION DOSE REDUCTION: This exam was performed according to the departmental dose-optimization program which includes automated exposure control, adjustment of the mA and/or kV according to patient size and/or use of iterative reconstruction technique.  COMPARISON:  CTA chest, CT abdomen/pelvis 05/23/2022  FINDINGS: CT THORACIC SPINE FINDINGS  Alignment: Normal. There is no evidence of traumatic malalignment. There is no significant antero or retrolisthesis.  Vertebrae: Mild anterior wedge deformity of the T10 and T11 vertebral bodies is unchanged. Vertebral body heights are otherwise preserved, without evidence of acute fracture there is no suspicious osseous lesion.  Paraspinal and other soft tissues: The paraspinal soft tissues are unremarkable.  Disc levels: There is multilevel degenerative endplate change with right-sided endplate osteophytes. There is minimal facet arthropathy. There is no evidence of high-grade osseous spinal canal or neural foraminal stenosis.  CT LUMBAR SPINE  FINDINGS  Segmentation: Standard; the lowest formed disc space is designated L5-S1.  Alignment: Normal. There is no antero or retrolisthesis. There is no evidence of traumatic malalignment.  Vertebrae: There is an acute compression fracture of the L2 vertebral body with fracture planes involving the anterior, superior, and inferior endplates there is no evidence of extension into the posterior elements. There is no bony retropulsion. There is up to proximally 20% loss of vertebral body height centrally.  The other vertebral body heights are preserved. There is no other evidence of acute fracture. There is no suspicious osseous lesion.  Paraspinal and other soft tissues: Unremarkable.  Disc levels: There is disc space narrowing and degenerative endplate change at L3-L4 through L5-S1. There is overall mild multilevel facet arthropathy. There is mild spinal canal stenosis and mild neural foraminal stenosis at L3-L4 through L5-S1.  IMPRESSION: 1. Acute compression fracture of the L2 vertebral body resulting in up to approximately 20% loss of vertebral body height. No bony retropulsion or evidence of extension into the posterior elements. 2. No other acute fracture or traumatic malalignment of the thoracic or lumbar spine.  Electronically Signed   By: Lesia Hausen M.D.   On: 11/09/2022 08:05 CT T-SPINE NO CHARGE CLINICAL DATA:  Fall  EXAM: CT THORACIC AND LUMBAR SPINE WITHOUT CONTRAST  TECHNIQUE: Multidetector CT imaging of the thoracic and lumbar spine was performed without contrast. Multiplanar CT image reconstructions were also generated.  RADIATION DOSE REDUCTION: This exam was performed according to the departmental dose-optimization program which includes automated exposure control, adjustment of the mA and/or kV according to patient size and/or use of iterative reconstruction technique.  COMPARISON:  CTA chest, CT abdomen/pelvis 05/23/2022  FINDINGS: CT THORACIC  SPINE FINDINGS  Alignment: Normal. There is no evidence of traumatic malalignment. There is no significant antero or retrolisthesis.  Vertebrae: Mild anterior wedge deformity of the T10 and T11 vertebral bodies is unchanged. Vertebral body heights are otherwise preserved, without evidence of acute fracture there is no suspicious osseous lesion.  Paraspinal and other soft tissues: The paraspinal soft tissues are unremarkable.  Disc levels: There is multilevel degenerative endplate change with right-sided endplate osteophytes. There is minimal  facet arthropathy. There is no evidence of high-grade osseous spinal canal or neural foraminal stenosis.  CT LUMBAR SPINE FINDINGS  Segmentation: Standard; the lowest formed disc space is designated L5-S1.  Alignment: Normal. There is no antero or retrolisthesis. There is no evidence of traumatic malalignment.  Vertebrae: There is an acute compression fracture of the L2 vertebral body with fracture planes involving the anterior, superior, and inferior endplates there is no evidence of extension into the posterior elements. There is no bony retropulsion. There is up to proximally 20% loss of vertebral body height centrally.  The other vertebral body heights are preserved. There is no other evidence of acute fracture. There is no suspicious osseous lesion.  Paraspinal and other soft tissues: Unremarkable.  Disc levels: There is disc space narrowing and degenerative endplate change at L3-L4 through L5-S1. There is overall mild multilevel facet arthropathy. There is mild spinal canal stenosis and mild neural foraminal stenosis at L3-L4 through L5-S1.  IMPRESSION: 1. Acute compression fracture of the L2 vertebral body resulting in up to approximately 20% loss of vertebral body height. No bony retropulsion or evidence of extension into the posterior elements. 2. No other acute fracture or traumatic malalignment of the thoracic or lumbar  spine.  Electronically Signed   By: Lesia Hausen M.D.   On: 11/09/2022 08:05 CT Cervical Spine Wo Contrast CLINICAL DATA:  Fall  EXAM: CT HEAD WITHOUT CONTRAST  CT CERVICAL SPINE WITHOUT CONTRAST  TECHNIQUE: Multidetector CT imaging of the head and cervical spine was performed following the standard protocol without intravenous contrast. Multiplanar CT image reconstructions of the cervical spine were also generated.  RADIATION DOSE REDUCTION: This exam was performed according to the departmental dose-optimization program which includes automated exposure control, adjustment of the mA and/or kV according to patient size and/or use of iterative reconstruction technique.  COMPARISON:  CT head 05/23/2022, CT cervical spine 09/13/2020  FINDINGS: CT HEAD FINDINGS  Brain: A right frontal ventricular catheter is in place terminating in the body of the right lateral ventricle near the midline. The ventricles are unchanged in size or configuration since 05/23/2022. There is no evidence of transependymal flow of CSF.  There is no acute intracranial hemorrhage, extra-axial fluid collection, or acute infarct  Gray-white differentiation is preserved. Hypodensity in the supratentorial white matter likely reflects underlying chronic small-vessel ischemic change. The pituitary and suprasellar region are normal. There is no mass lesion. There is no mass effect or midline shift.  Vascular: No hyperdense vessel or unexpected calcification.  Skull: Normal. Negative for fracture or focal lesion.  Sinuses/Orbits: The imaged paranasal sinuses are clear. Bilateral lens implants are in place. The globes and orbits are otherwise unremarkable.  Other: The mastoid air cells and middle ear cavities are clear.  CT CERVICAL SPINE FINDINGS  Alignment: Normal. There is no antero or retrolisthesis. There is no jumped or perched facet or other evidence of traumatic malalignment.  Skull base and  vertebrae: Skull base alignment is maintained. Vertebral body heights are preserved. There is no evidence of acute fracture. There is no suspicious osseous lesion.  Soft tissues and spinal canal: No prevertebral fluid or swelling. No visible canal hematoma.  Disc levels: There is mild disc space narrowing and degenerative endplate change C5-C6 and C6-C7. There is overall mild facet arthropathy. There is no evidence of high-grade spinal canal or neural foraminal stenosis.  Other: None.  IMPRESSION: 1. No acute intracranial pathology. 2. Stable right frontal ventricular catheter with unchanged size and configuration of the ventricular  system. 3. No acute fracture or traumatic malalignment of the cervical spine.  Electronically Signed   By: Lesia Hausen M.D.   On: 11/09/2022 07:56 CT HEAD WO CONTRAST ( ) CLINICAL DATA:  Fall  EXAM: CT HEAD WITHOUT CONTRAST  CT CERVICAL SPINE WITHOUT CONTRAST  TECHNIQUE: Multidetector CT imaging of the head and cervical spine was performed following the standard protocol without intravenous contrast. Multiplanar CT image reconstructions of the cervical spine were also generated.  RADIATION DOSE REDUCTION: This exam was performed according to the departmental dose-optimization program which includes automated exposure control, adjustment of the mA and/or kV according to patient size and/or use of iterative reconstruction technique.  COMPARISON:  CT head 05/23/2022, CT cervical spine 09/13/2020  FINDINGS: CT HEAD FINDINGS  Brain: A right frontal ventricular catheter is in place terminating in the body of the right lateral ventricle near the midline. The ventricles are unchanged in size or configuration since 05/23/2022. There is no evidence of transependymal flow of CSF.  There is no acute intracranial hemorrhage, extra-axial fluid collection, or acute infarct  Gray-white differentiation is preserved. Hypodensity in  the supratentorial white matter likely reflects underlying chronic small-vessel ischemic change. The pituitary and suprasellar region are normal. There is no mass lesion. There is no mass effect or midline shift.  Vascular: No hyperdense vessel or unexpected calcification.  Skull: Normal. Negative for fracture or focal lesion.  Sinuses/Orbits: The imaged paranasal sinuses are clear. Bilateral lens implants are in place. The globes and orbits are otherwise unremarkable.  Other: The mastoid air cells and middle ear cavities are clear.  CT CERVICAL SPINE FINDINGS  Alignment: Normal. There is no antero or retrolisthesis. There is no jumped or perched facet or other evidence of traumatic malalignment.  Skull base and vertebrae: Skull base alignment is maintained. Vertebral body heights are preserved. There is no evidence of acute fracture. There is no suspicious osseous lesion.  Soft tissues and spinal canal: No prevertebral fluid or swelling. No visible canal hematoma.  Disc levels: There is mild disc space narrowing and degenerative endplate change C5-C6 and C6-C7. There is overall mild facet arthropathy. There is no evidence of high-grade spinal canal or neural foraminal stenosis.  Other: None.  IMPRESSION: 1. No acute intracranial pathology. 2. Stable right frontal ventricular catheter with unchanged size and configuration of the ventricular system. 3. No acute fracture or traumatic malalignment of the cervical spine.  Electronically Signed   By: Lesia Hausen M.D.   On: 11/09/2022 07:56  Lab Results  Component Value Date   WBC 7.4 11/09/2022   HGB 11.9 (L) 11/09/2022   HCT 39.3 11/09/2022   MCV 87.5 11/09/2022   PLT 180 11/09/2022   Last metabolic panel Lab Results  Component Value Date   GLUCOSE 184 (H) 11/09/2022   NA 136 11/09/2022   K 3.9 11/09/2022   CL 102 11/09/2022   CO2 26 11/09/2022   BUN 19 11/09/2022   CREATININE 0.85 11/09/2022   GFRNONAA  >60 11/09/2022   CALCIUM 9.0 11/09/2022   PROT 6.7 11/09/2022   ALBUMIN 3.7 11/09/2022   LABGLOB 2.9 05/04/2018   AGRATIO 1.6 07/20/2017   BILITOT 0.7 11/09/2022   ALKPHOS 50 11/09/2022   AST 30 11/09/2022   ALT 23 11/09/2022   ANIONGAP 8 11/09/2022    Assessment and Plan: * L2 vertebral fracture (HCC) Noted L2 vertebral compression fracture status post fall today Dr. Marcell Barlow with neurosurgery made aware.  As per Dr. Esmond Harps brace PT OT evaluation Follow  Fall precautions  Hypoxia Patient presented with O2 sats in mid 80s on room air, transition to 2 L nasal cannula to keep O2 sats greater than 94% Per the patient and wife, this appears to be a recurrent/subacute versus chronic issue No active CP  On CPAP daily-?element of OHS Chest x-ray/CT chest without contrast grossly stable Does appear mildly volume overloaded in the setting of HFpEF IV Lasix Lower extremity ultrasound pending to rule out DVT Consider VQ scan as clinically appropriate     (HFpEF) heart failure with preserved ejection fraction (HCC) 2D ECHO 07/2019 w/ EF 60-65% and grade 1 DD  Mildly decompensated in setting of hypoxia  BNP 115  CXR stable, though w/ persistent LE swelling Cor pulmonale likely confounding issue  Gentle IV diuresis 2D ECHO  Monitor volume and resp status  Cardiology consult as clinically indicated   Fall at home, initial encounter Mechanical fall at home w/ noted secondary L2 vertebral fracture  No weakness/dizziness prior to fall Baseline gait instability is a confounding issue Pain control  PT/OT evaluation  Fall precautions  Follow   History of hydrocephalus S/p VP shunt. CT head stable    Sleep apnea CPAP   Acid reflux PPI  Hyperlipidemia Cont statin   HTN (hypertension) BP stable  Titrate home regimen    Uncontrolled type 2 diabetes mellitus with hyperglycemia, with long-term current use of insulin (HCC) SSI  A1C     Greater than 50% was  spent in counseling and coordination of care with patient Total encounter time 80 minutes or more   Advance Care Planning:   Code Status: Full Code   Consults: None   Family Communication: Wife at the bedside   Severity of Illness: The appropriate patient status for this patient is OBSERVATION. Observation status is judged to be reasonable and necessary in order to provide the required intensity of service to ensure the patient's safety. The patient's presenting symptoms, physical exam findings, and initial radiographic and laboratory data in the context of their medical condition is felt to place them at decreased risk for further clinical deterioration. Furthermore, it is anticipated that the patient will be medically stable for discharge from the hospital within 2 midnights of admission.   Author: Floydene Flock, MD 11/09/2022 10:08 AM  For on call review [email or web address].

## 2022-11-09 NOTE — Assessment & Plan Note (Signed)
S/p VP shunt. CT head stable

## 2022-11-09 NOTE — Assessment & Plan Note (Signed)
SSI  A1C  

## 2022-11-09 NOTE — Assessment & Plan Note (Addendum)
Patient presented with O2 sats in mid 80s on room air, transition to 2 L nasal cannula to keep O2 sats greater than 94% Per the patient and wife, this appears to be a recurrent/subacute versus chronic issue No active CP  On CPAP daily-?element of OHS Chest x-ray/CT chest without contrast grossly stable Does appear mildly volume overloaded in the setting of HFpEF IV Lasix Lower extremity ultrasound pending to rule out DVT Consider VQ scan as clinically appropriate

## 2022-11-09 NOTE — Assessment & Plan Note (Signed)
CPAP.  

## 2022-11-09 NOTE — ED Triage Notes (Signed)
Patient fell at home, and complains of hip and lower back pain.  Patient denies hitting head or LOC.  Patient lost balance and fell from a standing position.  He took 5mg  of hydrocodone and he took robaxin.  He says no relief from pain.  Patient has hx of hypertension.

## 2022-11-09 NOTE — Assessment & Plan Note (Signed)
2D ECHO 07/2019 w/ EF 60-65% and grade 1 DD  Mildly decompensated in setting of hypoxia  BNP 115  CXR stable, though w/ persistent LE swelling Cor pulmonale likely confounding issue  Gentle IV diuresis 2D ECHO  Monitor volume and resp status  Cardiology consult as clinically indicated

## 2022-11-09 NOTE — Assessment & Plan Note (Signed)
PPI ?

## 2022-11-09 NOTE — ED Notes (Signed)
Pt to CT at this time.

## 2022-11-09 NOTE — Progress Notes (Signed)
PHARMACIST - PHYSICIAN COMMUNICATION  CONCERNING:  Enoxaparin (Lovenox) for DVT Prophylaxis    RECOMMENDATION: Patient was prescribed enoxaprin 40mg  q24 hours for VTE prophylaxis.   Filed Weights   11/09/22 0640  Weight: (!) 154.4 kg (340 lb 4.8 oz)    Body mass index is 36.47 kg/m.  Estimated Creatinine Clearance: 142 mL/min (by C-G formula based on SCr of 0.85 mg/dL).   Based on [facility] policy patient is candidate for enoxaparin 0.5mg /kg TBW SQ every 24 hours based on BMI being >30.   DESCRIPTION: Pharmacy has adjusted enoxaparin dose per [facility] policy.  Patient is now receiving enoxaparin 75 mg every 24 hours   Gardner Candle, PharmD, BCPS Clinical Pharmacist 11/09/2022 9:41 AM

## 2022-11-09 NOTE — Assessment & Plan Note (Signed)
BP stable Titrate home regimen 

## 2022-11-09 NOTE — ED Notes (Addendum)
Patient showed 86-88% on room air.  He was placed on [facility].  Dr. Fanny Bien notified.

## 2022-11-09 NOTE — Progress Notes (Signed)
*  PRELIMINARY RESULTS* Echocardiogram 2D Echocardiogram has been performed.  Cristela Blue 11/09/2022, 1:33 PM

## 2022-11-09 NOTE — ED Provider Notes (Signed)
[facility] Provider Note    Event Date/Time   First MD Initiated Contact with Patient 11/09/22 [phone]     (approximate)   History   Fall   HPI  Nathan Dean is a 67 y.o. male with past medical history of obesity, type 2 diabetes, hydrocephalus status post VP shunt, hypertension who presents because of a fall.  Patient was getting out of bed this morning to go to the bathroom when his right foot tripped him up causing him to fall.  He hit his back did not hit his head.  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  He has had significant pain in the low back worse on the left.  No radiation of pain no numbness or tingling in his legs.  Denies any pain elsewhere including the head neck chest or abdomen.  He denies dyspnea.  Denies cough fevers chills.  Says he has chronic leg swelling this is unchanged.  Denies history of CHF.     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    Diabetes (HCC)    GERD (gastroesophageal reflux disease)    Hyperlipidemia    Hypertension    Obesity    Sleep apnea     Patient Active Problem List   Diagnosis Date Noted   Acute respiratory failure with hypoxia (HCC) 05/26/2022   Influenza A 05/26/2022   Obesity, Class III, BMI 40-49.9 (morbid obesity) (HCC) 05/26/2022   Localized pain of left shoulder joint 02/02/2022   Disorder of left suprascapular nerve 02/02/2022   Chronic right shoulder pain 07/30/2021   Cervical facet joint syndrome 07/30/2021   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/2021   Chronic pain syndrome 07/30/2021   History of bilateral knee replacement 07/30/2021   Bilateral chronic knee pain 07/30/2021   Post laminectomy syndrome 07/30/2021   Acute diastolic CHF (congestive heart failure) (HCC) 08/17/2019   Edema    Acute metabolic encephalopathy 08/12/2019   Acute pulmonary edema (HCC)  08/12/2019   Acute cor pulmonale (HCC) 08/11/2019   Oxygen desaturation 08/08/2019   S/P right rotator cuff repair 08/07/2019   Hypoxia 10/22/2018   Infection of right prepatellar bursa 10/18/2018   Sepsis (HCC) 10/18/2018   Chest congestion 07/20/2017   Viral illness 07/20/2017   Lower respiratory infection 07/12/2017   Laryngitis, acute 07/12/2017   Cough 07/12/2017   Pleurisy 07/12/2017   Depression 06/03/2015   HTN (hypertension) 01/02/2015   Hyperlipidemia 01/02/2015   Uncontrolled type 2 diabetes mellitus with hyperglycemia, with long-term current use of insulin (HCC) 12/23/2014   Sleep apnea 09/21/2010   Arthritis, degenerative 02/17/2009   Hereditary and idiopathic peripheral neuropathy 11/21/2008   AD (atopic dermatitis) 07/08/2008   Acid reflux 04/05/2007     Physical Exam  Triage Vital Signs: ED Triage Vitals  Enc Vitals Group     BP 11/09/22 0636 (!) 205/109     Pulse Rate 11/09/22 0636 92     Resp 11/09/22 0636 (!) 25     Temp 11/09/22 0636 97.7 F (36.5 C)     Temp Source 11/09/22 0636 Oral     SpO2 11/09/22 0636 (!) 86 %     Weight 11/09/22 0640 (!) 340 lb 4.8 oz (154.4 kg)     Height 11/09/22 0640 6\' 9"  (2.057 m)     Head Circumference --  Peak Flow --      Pain Score 11/09/22 0640 8     Pain Loc --      Pain Edu? --      Excl. in GC? --     Most recent vital signs: Vitals:   11/09/22 0700 11/09/22 0747  BP: (!) 182/90 (!) 186/93  Pulse: (!) 104 82  Resp: 13 17  Temp:    SpO2: 93% 94%     General: Awake, no distress.  Patient is obese CV:  Good peripheral perfusion.  Nonpitting edema bilateral lower extremities with chronic venous stasis changes Resp:  Normal effort.  Abd:  No distention.  Neuro:             Awake, Alert, Oriented x 3  Other:  No midline C or T-spine tenderness, tenderness palpation of the entire lumbar midline as well as the left paraspinal region There is a small area of ecchymosis just distal to the right scapula  and midline, no significant tenderness, no crepitus Patient is able to range both hips without difficulty With hip flexion bilateral lower extremities, 5 out of 5 strength with plantarflexion dorsiflexion on the left, 5-5 strength with plantarflexion right 4-5 with dorsiflexion on the right   ED Results / Procedures / Treatments  Labs (all labs ordered are listed, but only abnormal results are displayed) Labs Reviewed  COMPREHENSIVE METABOLIC PANEL - Abnormal; Notable for the following components:      Result Value   Glucose, Bld 184 (*)    All other components within normal limits  BRAIN NATRIURETIC PEPTIDE - Abnormal; Notable for the following components:   B Natriuretic Peptide 115.2 (*)    All other components within normal limits  CBC WITH DIFFERENTIAL/PLATELET - Abnormal; Notable for the following components:   Hemoglobin 11.9 (*)    RDW 16.5 (*)    All other components within normal limits  TROPONIN I (HIGH SENSITIVITY)  TROPONIN I (HIGH SENSITIVITY)     EKG  EKG interpretation performed by myself: NSR, nml axis, nml intervals, no acute ischemic changes    RADIOLOGY I reviewed and interpreted the CT scan of the brain which does not show any acute intracranial process    PROCEDURES:  Critical Care performed: Yes, see critical care procedure note(s)  Procedures  The patient is on the cardiac monitor to evaluate for evidence of arrhythmia and/or significant heart rate changes.   MEDICATIONS ORDERED IN ED: Medications  labetalol (NORMODYNE) injection 10 mg (has no administration in time range)  HYDROmorphone (DILAUDID) injection 1 mg (1 mg Intravenous Given 11/09/22 0747)  HYDROmorphone (DILAUDID) injection 1 mg (1 mg Intravenous Given 11/09/22 0832)     IMPRESSION / MDM / ASSESSMENT AND PLAN / ED COURSE  I reviewed the triage vital signs and the nursing notes.                              Patient's presentation is most consistent with acute presentation  with potential threat to life or bodily function.  Differential diagnosis includes, but is not limited to, rib fracture, pneumothorax, CHF, hypertensive emergency/urgency, musculoskeletal pain, lumbar strain, lumbar compression fracture  The patient is a 67 year old male with multiple medical comorbidities including diabetes with chronic neuropathy diastolic CHF, hypertension and VP shunt who presents today after a fall.  He was getting out of bed today when his right foot tripped him up causing him to fall onto his back.  His main complaint is low back pain worse on the left but does not have any radiation of pain down the legs or numbness.  He was not able to get up on his own.  Toxic on arrival saturating 86% on room air placed on 2 L nasal cannula with sats in the mid 90s.  He is also significantly hypertensive mildly tachycardic.  My exam patient looks comfortable, not in respiratory distress.  He is able to flex both of his hips and has good strength just with a chronic foot drop on the right which is his baseline.  Does have some edema in the lower extremities with chronic venous stasis changes which she tells me is his baseline.  He does not have any midline C or T-spine tenderness but does have midline L-spine tenderness as well as left paraspinal tenderness.  Does have some ecchymosis in the right upper back just distal to the scapula but there is no crepitus.  Given patient's hypoxia will obtain chest x-ray as well as CT of the chest.  Also obtain CT T and L-spine and CT of his head.  Will check labs including CBC CMP and BNP given the hypoxia.  Will give Dilaudid for pain.  Given patient is requiring oxygen do anticipate he will likely need admission.  Patient's imaging does show an acute L2 compression fracture with 20% height loss no retropulsion.  Did inform Dr. Marcell Barlow and he recommends getting an LSO brace.  Patient had minimal relief from 1 mg of Dilaudid so was given another milligram  pain is coming down but is still significant.  CT chest has no acute findings no evidence of pulmonary edema.  BNP just mildly elevated.  Blood pressure continues to be high as well which could be pain related and the fact the patient did not take his medications today.  Will give a dose of labetalol.   With patient not being symptomatic with chest pain or dyspnea my suspicion for PE overall is low.  Will give a dose of Lasix.  If continues to have persistent hypoxia in the hospital likely will need additional workup with either duplex ultrasound of the legs or CTA.     FINAL CLINICAL IMPRESSION(S) / ED DIAGNOSES   Final diagnoses:  Acute respiratory failure with hypoxia (HCC)  Compression fracture of L2 vertebra, initial encounter (HCC)     Rx / DC Orders   ED Discharge Orders     None        Note:  This document was prepared using Dragon voice recognition software and may include unintentional dictation errors.   Georga Hacking, MD 11/09/22 [phone]

## 2022-11-10 DIAGNOSIS — M21371 Foot drop, right foot: Secondary | ICD-10-CM | POA: Diagnosis present

## 2022-11-10 DIAGNOSIS — Z794 Long term (current) use of insulin: Secondary | ICD-10-CM

## 2022-11-10 DIAGNOSIS — Z96653 Presence of artificial knee joint, bilateral: Secondary | ICD-10-CM | POA: Diagnosis present

## 2022-11-10 DIAGNOSIS — E1140 Type 2 diabetes mellitus with diabetic neuropathy, unspecified: Secondary | ICD-10-CM | POA: Diagnosis present

## 2022-11-10 DIAGNOSIS — W010XXA Fall on same level from slipping, tripping and stumbling without subsequent striking against object, initial encounter: Secondary | ICD-10-CM | POA: Diagnosis present

## 2022-11-10 DIAGNOSIS — E1165 Type 2 diabetes mellitus with hyperglycemia: Secondary | ICD-10-CM

## 2022-11-10 DIAGNOSIS — W19XXXA Unspecified fall, initial encounter: Secondary | ICD-10-CM | POA: Diagnosis not present

## 2022-11-10 DIAGNOSIS — Y92009 Unspecified place in unspecified non-institutional (private) residence as the place of occurrence of the external cause: Secondary | ICD-10-CM | POA: Diagnosis not present

## 2022-11-10 DIAGNOSIS — S32020A Wedge compression fracture of second lumbar vertebra, initial encounter for closed fracture: Secondary | ICD-10-CM | POA: Diagnosis not present

## 2022-11-10 DIAGNOSIS — I2781 Cor pulmonale (chronic): Secondary | ICD-10-CM | POA: Diagnosis present

## 2022-11-10 DIAGNOSIS — E785 Hyperlipidemia, unspecified: Secondary | ICD-10-CM | POA: Diagnosis present

## 2022-11-10 DIAGNOSIS — Z8669 Personal history of other diseases of the nervous system and sense organs: Secondary | ICD-10-CM | POA: Diagnosis not present

## 2022-11-10 DIAGNOSIS — Z85828 Personal history of other malignant neoplasm of skin: Secondary | ICD-10-CM | POA: Diagnosis not present

## 2022-11-10 DIAGNOSIS — I959 Hypotension, unspecified: Secondary | ICD-10-CM | POA: Diagnosis present

## 2022-11-10 DIAGNOSIS — I110 Hypertensive heart disease with heart failure: Secondary | ICD-10-CM | POA: Diagnosis present

## 2022-11-10 DIAGNOSIS — G4730 Sleep apnea, unspecified: Secondary | ICD-10-CM | POA: Diagnosis present

## 2022-11-10 DIAGNOSIS — I5033 Acute on chronic diastolic (congestive) heart failure: Secondary | ICD-10-CM | POA: Diagnosis present

## 2022-11-10 DIAGNOSIS — Z87891 Personal history of nicotine dependence: Secondary | ICD-10-CM | POA: Diagnosis not present

## 2022-11-10 DIAGNOSIS — Z888 Allergy status to other drugs, medicaments and biological substances status: Secondary | ICD-10-CM | POA: Diagnosis not present

## 2022-11-10 DIAGNOSIS — Z6837 Body mass index (BMI) 37.0-37.9, adult: Secondary | ICD-10-CM | POA: Diagnosis not present

## 2022-11-10 DIAGNOSIS — M4856XA Collapsed vertebra, not elsewhere classified, lumbar region, initial encounter for fracture: Secondary | ICD-10-CM | POA: Diagnosis present

## 2022-11-10 DIAGNOSIS — Z85528 Personal history of other malignant neoplasm of kidney: Secondary | ICD-10-CM | POA: Diagnosis not present

## 2022-11-10 DIAGNOSIS — Z880 Allergy status to penicillin: Secondary | ICD-10-CM | POA: Diagnosis not present

## 2022-11-10 DIAGNOSIS — K219 Gastro-esophageal reflux disease without esophagitis: Secondary | ICD-10-CM | POA: Diagnosis present

## 2022-11-10 DIAGNOSIS — G9341 Metabolic encephalopathy: Secondary | ICD-10-CM | POA: Diagnosis present

## 2022-11-10 DIAGNOSIS — G4739 Other sleep apnea: Secondary | ICD-10-CM

## 2022-11-10 DIAGNOSIS — Z7984 Long term (current) use of oral hypoglycemic drugs: Secondary | ICD-10-CM | POA: Diagnosis not present

## 2022-11-10 DIAGNOSIS — Z882 Allergy status to sulfonamides status: Secondary | ICD-10-CM | POA: Diagnosis not present

## 2022-11-10 DIAGNOSIS — J9601 Acute respiratory failure with hypoxia: Secondary | ICD-10-CM | POA: Diagnosis present

## 2022-11-10 DIAGNOSIS — Z982 Presence of cerebrospinal fluid drainage device: Secondary | ICD-10-CM | POA: Diagnosis not present

## 2022-11-10 LAB — COMPREHENSIVE METABOLIC PANEL
ALT: 22 U/L (ref 0–44)
AST: 26 U/L (ref 15–41)
Albumin: 4.5 g/dL (ref 3.5–5.0)
Alkaline Phosphatase: 61 U/L (ref 38–126)
Anion gap: 11 (ref 5–15)
BUN: 25 mg/dL — ABNORMAL HIGH (ref 8–23)
CO2: 28 mmol/L (ref 22–32)
Calcium: 9.5 mg/dL (ref 8.9–10.3)
Chloride: 97 mmol/L — ABNORMAL LOW (ref 98–111)
Creatinine, Ser: 1.19 mg/dL (ref 0.61–1.24)
GFR, Estimated: >60 mL/min (ref >60)
Glucose, Bld: 191 mg/dL — ABNORMAL HIGH (ref 70–99)
Potassium: 4.1 mmol/L (ref 3.5–5.1)
Sodium: 136 mmol/L (ref 135–145)
Total Bilirubin: 0.8 mg/dL (ref 0.3–1.2)
Total Protein: 7.5 g/dL (ref 6.5–8.1)

## 2022-11-10 LAB — GLUCOSE, CAPILLARY
Glucose-Capillary: 167 mg/dL — ABNORMAL HIGH (ref 70–99)
Glucose-Capillary: 165 mg/dL — ABNORMAL HIGH (ref 70–99)
Glucose-Capillary: 165 mg/dL — ABNORMAL HIGH (ref 70–99)
Glucose-Capillary: 217 mg/dL — ABNORMAL HIGH (ref 70–99)
Glucose-Capillary: 200 mg/dL — ABNORMAL HIGH (ref 70–99)

## 2022-11-10 LAB — ECHOCARDIOGRAM COMPLETE
AR max vel: 1.6 cm2
AV Area VTI: 1.62 cm2
AV Area mean vel: 1.32 cm2
AV Mean grad: 9 mmHg
AV Peak grad: 15.2 mmHg
Ao pk vel: 1.95 m/s
Area-P 1/2: 2.56 cm2
Height: 81 in
MV VTI: 2.35 cm2
S' Lateral: 2.9 cm
Weight: 5444.8 oz

## 2022-11-10 LAB — CBC
HCT: 44.2 % (ref 39.0–52.0)
Hemoglobin: 13 g/dL (ref 13.0–17.0)
MCH: 26.7 pg (ref 26.0–34.0)
MCHC: 29.4 g/dL — ABNORMAL LOW (ref 30.0–36.0)
MCV: 90.9 fL (ref 80.0–100.0)
Platelets: 225 10*3/uL (ref 150–400)
RBC: 4.86 MIL/uL (ref 4.22–5.81)
RDW: 17 % — ABNORMAL HIGH (ref 11.5–15.5)
WBC: 9 10*3/uL (ref 4.0–10.5)
nRBC: 0.2 % (ref 0.0–0.2)

## 2022-11-10 MED ORDER — INSULIN ASPART 100 UNIT/ML IJ SOLN: 0.0000 [IU] | Freq: Every day | INTRAMUSCULAR | Status: DC

## 2022-11-10 MED ORDER — SODIUM CHLORIDE 0.9 % IV BOLUS
500.0000 mL | Freq: Once | INTRAVENOUS | Status: AC
  Administered 2022-11-10: 500 mL via INTRAVENOUS

## 2022-11-10 MED ORDER — ROSUVASTATIN CALCIUM 10 MG PO TABS
20.0000 mg | ORAL_TABLET | Freq: Every day | ORAL | Status: DC
  Administered 2022-11-11: 20 mg via ORAL
  Filled 2022-11-10: qty 2

## 2022-11-10 MED ORDER — METHOCARBAMOL 500 MG PO TABS: 750.0000 mg | ORAL_TABLET | Freq: Four times a day (QID) | ORAL | Status: DC | PRN

## 2022-11-10 MED ORDER — INSULIN GLARGINE-YFGN 100 UNIT/ML ~~LOC~~ SOLN: 10.0000 [IU] | Freq: Every day | SUBCUTANEOUS | Status: DC

## 2022-11-10 MED ORDER — HYDROCODONE-ACETAMINOPHEN 5-325 MG PO TABS
1.0000 | ORAL_TABLET | Freq: Four times a day (QID) | ORAL | Status: DC | PRN
  Administered 2022-11-11 (×2): 1 via ORAL
  Filled 2022-11-10 (×2): qty 1

## 2022-11-10 MED ORDER — SENNOSIDES-DOCUSATE SODIUM 8.6-50 MG PO TABS
2.0000 | ORAL_TABLET | Freq: Every day | ORAL | Status: DC
  Administered 2022-11-11: 2 via ORAL
  Filled 2022-11-10: qty 2

## 2022-11-10 MED ORDER — VITAMIN B-12 1000 MCG PO TABS
5000.0000 ug | ORAL_TABLET | Freq: Every day | ORAL | Status: DC
  Administered 2022-11-11: 5000 ug via ORAL
  Filled 2022-11-10: qty 5

## 2022-11-10 MED ORDER — SODIUM CHLORIDE 0.9 % IV SOLN: INTRAVENOUS | Status: DC

## 2022-11-10 MED ORDER — VITAMIN D 25 MCG (1000 UNIT) PO TABS
2000.0000 [IU] | ORAL_TABLET | Freq: Every day | ORAL | Status: DC
  Administered 2022-11-11: 2000 [IU] via ORAL
  Filled 2022-11-10: qty 2

## 2022-11-10 MED ORDER — INSULIN ASPART 100 UNIT/ML IJ SOLN
0.0000 [IU] | Freq: Three times a day (TID) | INTRAMUSCULAR | Status: DC
  Administered 2022-11-10: 2 [IU] via SUBCUTANEOUS
  Administered 2022-11-10: 3 [IU] via SUBCUTANEOUS
  Filled 2022-11-10 (×2): qty 1

## 2022-11-10 MED ORDER — BUPROPION HCL ER (XL) 150 MG PO TB24
450.0000 mg | ORAL_TABLET | Freq: Every day | ORAL | Status: DC
  Administered 2022-11-11: 450 mg via ORAL
  Filled 2022-11-10: qty 3

## 2022-11-10 MED ORDER — MAGNESIUM OXIDE -MG SUPPLEMENT 400 (240 MG) MG PO TABS
400.0000 mg | ORAL_TABLET | Freq: Every day | ORAL | Status: DC
  Administered 2022-11-11: 400 mg via ORAL
  Filled 2022-11-10: qty 1

## 2022-11-10 NOTE — Progress Notes (Signed)
This nurse was called into the patient room because patient blood pressure of 81/50 and oxygen was 76-86. Patient was placed on 3L of O2 and repeat stats were 94 on 3L and BP 100/74. Patient 02 dropped again, patient was diaphoretic and c/o sharp back pain. Rapid was called and patient was given a 500 NS bolus. Patient was able to answer orientation questions but presented drowsy. Order was placed by MD to discontinue hydromorphone. We will continue to monitor patient for any changes and notify MD if necessary. Care of patient has transferred to RN at this time.

## 2022-11-10 NOTE — Plan of Care (Signed)
  Problem: Education: Goal: Knowledge of General Education information will improve Description: Including pain rating scale, medication(s)/side effects and non-pharmacologic comfort measures Outcome: Progressing   Problem: Health Behavior/Discharge Planning: Goal: Ability to manage health-related needs will improve Outcome: Progressing   Problem: Clinical Measurements: Goal: Ability to maintain clinical measurements within normal limits will improve Outcome: Progressing Goal: Will remain free from infection Outcome: Progressing Goal: Diagnostic test results will improve Outcome: Progressing Goal: Cardiovascular complication will be avoided Outcome: Progressing   Problem: Activity: Goal: Risk for activity intolerance will decrease Outcome: Progressing   Problem: Nutrition: Goal: Adequate nutrition will be maintained Outcome: Progressing   Problem: Elimination: Goal: Will not experience complications related to bowel motility Outcome: Progressing Goal: Will not experience complications related to urinary retention Outcome: Progressing   Problem: Pain Managment: Goal: General experience of comfort will improve Outcome: Progressing   

## 2022-11-10 NOTE — Progress Notes (Addendum)
1026 B/p 81/64 after bolus. Pt drowsy but easily aroused. Wife at bedside.   1030 Dr Allena Katz aware of pt b/p 81/64 and lethargic. Telephone order with readback for second dose of bolus and to hold any sedating medications.   1115 Telephone orders with readback to start IV maintenance fluids NS at 61ml/hr and place external cath  1328 Wife informed nurse pt has developed some shakiness. Nurse assessed b/p 181/70 and HR 109, temp 99.3. BS 167 Dr Allena Katz aware. Telephone order with readback to stop IV fluids and cont. To monitor  1818 Per Dr Allena Katz verbal order with readback. hold evening dose of PO Coreg.

## 2022-11-10 NOTE — Progress Notes (Signed)
[facility]  - [place] [facility]   PATIENT NAME: Nathan Dean    MR#:  [id number]  DATE OF BIRTH:  [birth year]  SUBJECTIVE:  life at bedside. Seen earlier. Rapid response called due to altered mental status/lethargic the setting of low blood pressure. Received IV Dilaudid higher dose earlier. Patient follows commands. I received IV fluids. Blood pressure improved.    VITALS:  Blood pressure (!) 181/70, pulse (!) 109, temperature 99.3 F (37.4 C), temperature source Oral, resp. rate 18, height 6\' 9"  (2.057 m), weight (!) 159.6 kg, SpO2 94 %.  PHYSICAL EXAMINATION:   GENERAL:  67 y.o.-year-old patient with no acute distress. Obese LUNGS: Normal breath sounds bilaterally, no wheezing CARDIOVASCULAR: S1, S2 normal. No murmur   ABDOMEN: Soft, nontender, nondistended. Bowel sounds present.  EXTREMITIES: No  edema b/l. Chronic foot drop NEUROLOGIC: nonfocal  patient is sleepy but arouses on verbal commands peripheral neuropathy   LABORATORY PANEL:  CBC Recent Labs  Lab 11/10/22 0432  WBC 9.0  HGB 13.0  HCT 44.2  PLT 225    Chemistries  Recent Labs  Lab 11/10/22 0432  NA 136  K 4.1  CL 97*  CO2 28  GLUCOSE 191*  BUN 25*  CREATININE 1.19  CALCIUM 9.5  AST 26  ALT 22  ALKPHOS 61  BILITOT 0.8   Cardiac Enzymes No results for input(s): "TROPONINI" in the last 168 hours. RADIOLOGY:  ECHOCARDIOGRAM COMPLETE  Result Date: 11/10/2022    ECHOCARDIOGRAM REPORT   Patient Name:   Nathan Dean Date of Exam: 11/09/2022 Medical Rec #:  [id number]       Height:       81.0 in Accession #:    [id number]      Weight:       340.3 lb Date of Birth:  04-May-1956        BSA:          2.908 m Patient Age:    67 years        BP:           149/77 mmHg Patient Gender: M               HR:           68 bpm. Exam Location:  [facility] Procedure: 2D Echo, Cardiac Doppler and Color Doppler Indications:     CHF-acute diastolic I50.31  History:         Patient has prior history of  Echocardiogram examinations, most                  recent 08/12/2019. Risk Factors:Diabetes, Hypertension and Sleep                  Apnea.  Sonographer:     Cristela Blue Referring Phys:  [id number] Francoise Schaumann [place] Diagnosing Phys: Yvonne Kendall MD IMPRESSIONS  1. Left ventricular ejection fraction, by estimation, is 55 to 60%. The left ventricle has normal function. The left ventricle demonstrates regional wall motion abnormalities (see scoring diagram/findings for description). There is moderate left ventricular hypertrophy. Left ventricular diastolic parameters are consistent with Grade I diastolic dysfunction (impaired relaxation). There is mild hypokinesis of the left ventricular, entire inferior wall.  2. Right ventricular systolic function is normal. The right ventricular size is normal. Tricuspid regurgitation signal is inadequate for assessing PA pressure.  3. The mitral valve is degenerative. Trivial mitral valve regurgitation. No evidence of mitral stenosis.  4. The aortic valve has an indeterminant  number of cusps. There is mild calcification of the aortic valve. There is moderate thickening of the aortic valve. Aortic valve regurgitation is not visualized. Aortic valve sclerosis/calcification is present, without any evidence of aortic stenosis. FINDINGS  Left Ventricle: Left ventricular ejection fraction, by estimation, is 55 to 60%. The left ventricle has normal function. The left ventricle demonstrates regional wall motion abnormalities. Mild hypokinesis of the left ventricular, entire inferior wall. The left ventricular internal cavity size was normal in size. There is moderate left ventricular hypertrophy. Left ventricular diastolic parameters are consistent with Grade I diastolic dysfunction (impaired relaxation). Right Ventricle: The right ventricular size is normal. No increase in right ventricular wall thickness. Right ventricular systolic function is normal. Tricuspid regurgitation signal is  inadequate for assessing PA pressure. Left Atrium: Left atrial size was normal in size. Right Atrium: Right atrial size was normal in size. Pericardium: The pericardium was not well visualized. Mitral Valve: The mitral valve is degenerative in appearance. There is mild thickening of the mitral valve leaflet(s). There is mild calcification of the mitral valve leaflet(s). Mild mitral annular calcification. Trivial mitral valve regurgitation. No evidence of mitral valve stenosis. MV peak gradient, 3.6 mmHg. The mean mitral valve gradient is 2.0 mmHg. Tricuspid Valve: The tricuspid valve is normal in structure. Tricuspid valve regurgitation is trivial. Aortic Valve: The aortic valve has an indeterminant number of cusps. There is mild calcification of the aortic valve. There is moderate thickening of the aortic valve. Aortic valve regurgitation is not visualized. Aortic valve sclerosis/calcification is present, without any evidence of aortic stenosis. Aortic valve mean gradient measures 9.0 mmHg. Aortic valve peak gradient measures 15.2 mmHg. Aortic valve area, by VTI measures 1.62 cm. Pulmonic Valve: The pulmonic valve was not well visualized. Pulmonic valve regurgitation is not visualized. No evidence of pulmonic stenosis. Aorta: The aortic root is normal in size and structure. Pulmonary Artery: The pulmonary artery is not well seen. IAS/Shunts: The interatrial septum was not well visualized.  LEFT VENTRICLE PLAX 2D LVIDd:         4.30 cm   Diastology LVIDs:         2.90 cm   LV e' medial:    5.66 cm/s LV PW:         1.50 cm   LV E/e' medial:  11.0 LV IVS:        1.40 cm   LV e' lateral:   7.51 cm/s LVOT diam:     2.10 cm   LV E/e' lateral: 8.3 LV SV:         64 LV SV Index:   22 LVOT Area:     3.46 cm  RIGHT VENTRICLE RV Basal diam:  3.00 cm RV Mid diam:    3.70 cm RV S prime:     15.20 cm/s TAPSE (M-mode): 2.7 cm LEFT ATRIUM           Index        RIGHT ATRIUM           Index LA diam:      4.30 cm 1.48 cm/m   RA  Area:     14.70 cm LA Vol (A2C): 48.2 ml 16.58 ml/m  RA Volume:   35.40 ml  12.17 ml/m LA Vol (A4C): 82.1 ml 28.23 ml/m  AORTIC VALVE AV Area (Vmax):    1.60 cm AV Area (Vmean):   1.32 cm AV Area (VTI):     1.62 cm AV Vmax:  195.00 cm/s AV Vmean:          137.333 cm/s AV VTI:            0.396 m AV Peak Grad:      15.2 mmHg AV Mean Grad:      9.0 mmHg LVOT Vmax:         90.20 cm/s LVOT Vmean:        52.300 cm/s LVOT VTI:          0.185 m LVOT/AV VTI ratio: 0.47  AORTA Ao Root diam: 3.10 cm MITRAL VALVE               TRICUSPID VALVE MV Area (PHT): 2.56 cm    TR Peak grad:   22.5 mmHg MV Area VTI:   2.35 cm    TR Vmax:        237.00 cm/s MV Peak grad:  3.6 mmHg MV Mean grad:  2.0 mmHg    SHUNTS MV Vmax:       0.95 m/s    Systemic VTI:  0.18 m MV Vmean:      66.5 cm/s   Systemic Diam: 2.10 cm MV Decel Time: 296 msec MV E velocity: 62.40 cm/s MV A velocity: 98.50 cm/s MV E/A ratio:  0.63 Christopher End MD Electronically signed by Yvonne Kendall MD Signature Date/Time: 11/10/2022/9:58:30 AM    Final    US Venous Img Lower Bilateral (DVT)  Result Date: 11/09/2022 CLINICAL DATA:  Lower extremity swelling EXAM: BILATERAL LOWER EXTREMITY VENOUS DOPPLER ULTRASOUND TECHNIQUE: Gray-scale sonography with graded compression, as well as color Doppler and duplex ultrasound were performed to evaluate the lower extremity deep venous systems from the level of the common femoral vein and including the common femoral, femoral, profunda femoral, popliteal and calf veins including the posterior tibial, peroneal and gastrocnemius veins when visible. Spectral Doppler was utilized to evaluate flow at rest and with distal augmentation maneuvers in the common femoral, femoral and popliteal veins. COMPARISON:  None Available. FINDINGS: RIGHT LOWER EXTREMITY Common Femoral Vein: No evidence of thrombus. Normal compressibility, respiratory phasicity and response to augmentation. Saphenofemoral Junction: No evidence of  thrombus. Normal compressibility and flow on color Doppler imaging. Profunda Femoral Vein: No evidence of thrombus. Normal compressibility and flow on color Doppler imaging. Femoral Vein: No evidence of thrombus. Normal compressibility, respiratory phasicity and response to augmentation. Popliteal Vein: No evidence of thrombus. Normal compressibility, respiratory phasicity and response to augmentation. Calf Veins: No evidence of thrombus. Normal compressibility and flow on color Doppler imaging. LEFT LOWER EXTREMITY Common Femoral Vein: No evidence of thrombus. Normal compressibility, respiratory phasicity and response to augmentation. Saphenofemoral Junction: No evidence of thrombus. Normal compressibility and flow on color Doppler imaging. Profunda Femoral Vein: No evidence of thrombus. Normal compressibility and flow on color Doppler imaging. Femoral Vein: No evidence of thrombus. Normal compressibility, respiratory phasicity and response to augmentation. Popliteal Vein: No evidence of thrombus. Normal compressibility, respiratory phasicity and response to augmentation. Calf Veins: No evidence of thrombus. Normal compressibility and flow on color Doppler imaging. IMPRESSION: No evidence of deep venous thrombosis in either lower extremity. Electronically Signed   By: Judie Petit.  Shick M.D.   On: 11/09/2022 10:49   DG Chest Portable 1 View  Result Date: 11/09/2022 CLINICAL DATA:  67 year old male status post fall.  Pain.  Hypoxia. EXAM: PORTABLE CHEST 1 VIEW COMPARISON:  Portable chest 10/18/2022 and earlier. FINDINGS: Portable AP semi upright view at 0813 hours. Right anterior chest wall shunt catheter redemonstrated. Improved  lung volumes. Normal cardiac size and mediastinal contours. Visualized tracheal air column is within normal limits. Allowing for portable technique the lungs are clear. No pneumothorax or pleural effusion. Paucity of bowel gas in the visible abdomen. No acute osseous abnormality identified.  IMPRESSION: Negative portable chest; right chest chronic VP shunt. Electronically Signed   By: Odessa Fleming M.D.   On: 11/09/2022 08:22   CT Chest Wo Contrast  Result Date: 11/09/2022 CLINICAL DATA:  Fall EXAM: CT CHEST WITHOUT CONTRAST TECHNIQUE: Multidetector CT imaging of the chest was performed following the standard protocol without IV contrast. RADIATION DOSE REDUCTION: This exam was performed according to the departmental dose-optimization program which includes automated exposure control, adjustment of the mA and/or kV according to patient size and/or use of iterative reconstruction technique. COMPARISON:  CTA chest 05/23/2022 FINDINGS: Cardiovascular: The heart size is normal. There is no pericardial effusion. Coronary artery calcifications, mitral valve calcifications, and aortic annular calcifications are again noted. The ascending thoracic aorta is mildly aneurysmal measuring up to 4.1 cm. Mediastinum/Nodes: The imaged thyroid is unremarkable. The esophagus is grossly unremarkable. There is no mediastinal, hilar, or axillary lymphadenopathy. Lungs/Pleura: The trachea and central airways are patent The lungs clear, with no focal consolidation or pulmonary edema. There is no pleural effusion or pneumothorax. There is no evidence of traumatic parenchymal injury. There are no suspicious nodules. Upper Abdomen: There is punctate nonobstructing right upper pole renal stone. The imaged portions of the upper abdominal viscera are otherwise unremarkable. Musculoskeletal: There is no acute rib fracture. There is no acute sternal fracture. The thoracic spine is assessed on the separately dictated CT thoracic spine. IMPRESSION: 1. Evidence of acute traumatic injury in the chest. 2. Coronary artery, aortic valve, and mitral annular calcifications. 3. 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 Recommend annual imaging follow-up. Electronically Signed   By: Lesia Hausen M.D.   On: 11/09/2022  08:14   CT T-SPINE NO CHARGE  Result Date: 11/09/2022 CLINICAL DATA:  Fall EXAM: CT THORACIC AND LUMBAR SPINE WITHOUT CONTRAST TECHNIQUE: Multidetector CT imaging of the thoracic and lumbar spine was performed without contrast. Multiplanar CT image reconstructions were also generated. RADIATION DOSE REDUCTION: This exam was performed according to the departmental dose-optimization program which includes automated exposure control, adjustment of the mA and/or kV according to patient size and/or use of iterative reconstruction technique. COMPARISON:  CTA chest, CT abdomen/pelvis 05/23/2022 FINDINGS: CT THORACIC SPINE FINDINGS Alignment: Normal. There is no evidence of traumatic malalignment. There is no significant antero or retrolisthesis. Vertebrae: Mild anterior wedge deformity of the T10 and T11 vertebral bodies is unchanged. Vertebral body heights are otherwise preserved, without evidence of acute fracture there is no suspicious osseous lesion. Paraspinal and other soft tissues: The paraspinal soft tissues are unremarkable. Disc levels: There is multilevel degenerative endplate change with right-sided endplate osteophytes. There is minimal facet arthropathy. There is no evidence of high-grade osseous spinal canal or neural foraminal stenosis. CT LUMBAR SPINE FINDINGS Segmentation: Standard; the lowest formed disc space is designated L5-S1. Alignment: Normal. There is no antero or retrolisthesis. There is no evidence of traumatic malalignment. Vertebrae: There is an acute compression fracture of the L2 vertebral body with fracture planes involving the anterior, superior, and inferior endplates there is no evidence of extension into the posterior elements. There is no bony retropulsion. There is up to proximally 20% loss of vertebral body height centrally. The other vertebral body heights are preserved. There is no other evidence of acute fracture.  There is no suspicious osseous lesion. Paraspinal and other  soft tissues: Unremarkable. Disc levels: There is disc space narrowing and degenerative endplate change at L3-L4 through L5-S1. There is overall mild multilevel facet arthropathy. There is mild spinal canal stenosis and mild neural foraminal stenosis at L3-L4 through L5-S1. IMPRESSION: 1. Acute compression fracture of the L2 vertebral body resulting in up to approximately 20% loss of vertebral body height. No bony retropulsion or evidence of extension into the posterior elements. 2. No other acute fracture or traumatic malalignment of the thoracic or lumbar spine. Electronically Signed   By: Lesia Hausen M.D.   On: 11/09/2022 08:05   CT Lumbar Spine Wo Contrast  Result Date: 11/09/2022 CLINICAL DATA:  Fall EXAM: CT THORACIC AND LUMBAR SPINE WITHOUT CONTRAST TECHNIQUE: Multidetector CT imaging of the thoracic and lumbar spine was performed without contrast. Multiplanar CT image reconstructions were also generated. RADIATION DOSE REDUCTION: This exam was performed according to the departmental dose-optimization program which includes automated exposure control, adjustment of the mA and/or kV according to patient size and/or use of iterative reconstruction technique. COMPARISON:  CTA chest, CT abdomen/pelvis 05/23/2022 FINDINGS: CT THORACIC SPINE FINDINGS Alignment: Normal. There is no evidence of traumatic malalignment. There is no significant antero or retrolisthesis. Vertebrae: Mild anterior wedge deformity of the T10 and T11 vertebral bodies is unchanged. Vertebral body heights are otherwise preserved, without evidence of acute fracture there is no suspicious osseous lesion. Paraspinal and other soft tissues: The paraspinal soft tissues are unremarkable. Disc levels: There is multilevel degenerative endplate change with right-sided endplate osteophytes. There is minimal facet arthropathy. There is no evidence of high-grade osseous spinal canal or neural foraminal stenosis. CT LUMBAR SPINE FINDINGS Segmentation:  Standard; the lowest formed disc space is designated L5-S1. Alignment: Normal. There is no antero or retrolisthesis. There is no evidence of traumatic malalignment. Vertebrae: There is an acute compression fracture of the L2 vertebral body with fracture planes involving the anterior, superior, and inferior endplates there is no evidence of extension into the posterior elements. There is no bony retropulsion. There is up to proximally 20% loss of vertebral body height centrally. The other vertebral body heights are preserved. There is no other evidence of acute fracture. There is no suspicious osseous lesion. Paraspinal and other soft tissues: Unremarkable. Disc levels: There is disc space narrowing and degenerative endplate change at L3-L4 through L5-S1. There is overall mild multilevel facet arthropathy. There is mild spinal canal stenosis and mild neural foraminal stenosis at L3-L4 through L5-S1. IMPRESSION: 1. Acute compression fracture of the L2 vertebral body resulting in up to approximately 20% loss of vertebral body height. No bony retropulsion or evidence of extension into the posterior elements. 2. No other acute fracture or traumatic malalignment of the thoracic or lumbar spine. Electronically Signed   By: Lesia Hausen M.D.   On: 11/09/2022 08:05   CT HEAD WO CONTRAST ( )  Result Date: 11/09/2022 CLINICAL DATA:  Fall EXAM: CT HEAD WITHOUT CONTRAST CT CERVICAL SPINE WITHOUT CONTRAST TECHNIQUE: Multidetector CT imaging of the head and cervical spine was performed following the standard protocol without intravenous contrast. Multiplanar CT image reconstructions of the cervical spine were also generated. RADIATION DOSE REDUCTION: This exam was performed according to the departmental dose-optimization program which includes automated exposure control, adjustment of the mA and/or kV according to patient size and/or use of iterative reconstruction technique. COMPARISON:  CT head 05/23/2022, CT cervical  spine 09/13/2020 FINDINGS: CT HEAD FINDINGS Brain: A right frontal ventricular catheter  is in place terminating in the body of the right lateral ventricle near the midline. The ventricles are unchanged in size or configuration since 05/23/2022. There is no evidence of transependymal flow of CSF. There is no acute intracranial hemorrhage, extra-axial fluid collection, or acute infarct Gray-white differentiation is preserved. Hypodensity in the supratentorial white matter likely reflects underlying chronic small-vessel ischemic change. The pituitary and suprasellar region are normal. There is no mass lesion. There is no mass effect or midline shift. Vascular: No hyperdense vessel or unexpected calcification. Skull: Normal. Negative for fracture or focal lesion. Sinuses/Orbits: The imaged paranasal sinuses are clear. Bilateral lens implants are in place. The globes and orbits are otherwise unremarkable. Other: The mastoid air cells and middle ear cavities are clear. CT CERVICAL SPINE FINDINGS Alignment: Normal. There is no antero or retrolisthesis. There is no jumped or perched facet or other evidence of traumatic malalignment. Skull base and vertebrae: Skull base alignment is maintained. Vertebral body heights are preserved. There is no evidence of acute fracture. There is no suspicious osseous lesion. Soft tissues and spinal canal: No prevertebral fluid or swelling. No visible canal hematoma. Disc levels: There is mild disc space narrowing and degenerative endplate change C5-C6 and C6-C7. There is overall mild facet arthropathy. There is no evidence of high-grade spinal canal or neural foraminal stenosis. Other: None. IMPRESSION: 1. No acute intracranial pathology. 2. Stable right frontal ventricular catheter with unchanged size and configuration of the ventricular system. 3. No acute fracture or traumatic malalignment of the cervical spine. Electronically Signed   By: Lesia Hausen M.D.   On: 11/09/2022 07:56   CT  Cervical Spine Wo Contrast  Result Date: 11/09/2022 CLINICAL DATA:  Fall EXAM: CT HEAD WITHOUT CONTRAST CT CERVICAL SPINE WITHOUT CONTRAST TECHNIQUE: Multidetector CT imaging of the head and cervical spine was performed following the standard protocol without intravenous contrast. Multiplanar CT image reconstructions of the cervical spine were also generated. RADIATION DOSE REDUCTION: This exam was performed according to the departmental dose-optimization program which includes automated exposure control, adjustment of the mA and/or kV according to patient size and/or use of iterative reconstruction technique. COMPARISON:  CT head 05/23/2022, CT cervical spine 09/13/2020 FINDINGS: CT HEAD FINDINGS Brain: A right frontal ventricular catheter is in place terminating in the body of the right lateral ventricle near the midline. The ventricles are unchanged in size or configuration since 05/23/2022. There is no evidence of transependymal flow of CSF. There is no acute intracranial hemorrhage, extra-axial fluid collection, or acute infarct Gray-white differentiation is preserved. Hypodensity in the supratentorial white matter likely reflects underlying chronic small-vessel ischemic change. The pituitary and suprasellar region are normal. There is no mass lesion. There is no mass effect or midline shift. Vascular: No hyperdense vessel or unexpected calcification. Skull: Normal. Negative for fracture or focal lesion. Sinuses/Orbits: The imaged paranasal sinuses are clear. Bilateral lens implants are in place. The globes and orbits are otherwise unremarkable. Other: The mastoid air cells and middle ear cavities are clear. CT CERVICAL SPINE FINDINGS Alignment: Normal. There is no antero or retrolisthesis. There is no jumped or perched facet or other evidence of traumatic malalignment. Skull base and vertebrae: Skull base alignment is maintained. Vertebral body heights are preserved. There is no evidence of acute fracture.  There is no suspicious osseous lesion. Soft tissues and spinal canal: No prevertebral fluid or swelling. No visible canal hematoma. Disc levels: There is mild disc space narrowing and degenerative endplate change C5-C6 and C6-C7. There is overall mild  facet arthropathy. There is no evidence of high-grade spinal canal or neural foraminal stenosis. Other: None. IMPRESSION: 1. No acute intracranial pathology. 2. Stable right frontal ventricular catheter with unchanged size and configuration of the ventricular system. 3. No acute fracture or traumatic malalignment of the cervical spine. Electronically Signed   By: Lesia Hausen M.D.   On: 11/09/2022 07:56    Assessment and Plan  Zaid Tomes is a 67 y.o. male with medical history significant of multiple medical issues including morbid obesity, peripheral neuropathy, type 2 diabetes, history of hydrocephalus status post shunting, history of renal cell cancer status posttreatment, hypertension, OSA, depression, chronic pain presenting with fall, L2 vertebral fracture, hypoxia.  Patient reports attempting to get out of bed earlier in the day.  Has difficulty with ambulation in the setting of neuropathy and foot drop, uses a walker.  Subsequently fell landing on his left side.  No reported head trauma loss consciousness.    CT C-spine, CT chest, CT L-spine grossly stable apart from noted L2 vertebral fracture.   L2 vertebral fracture (HCC) S/p mechanical fall at home with h/o neuropathy and foot drop --L2 vertebral compression fracture status post fall today --Dr. Marcell Barlow with neurosurgery recommends PT/OT, use LSO brace --Fall precautions   acute metabolic encephalopathy with hypotension suspected opioid induced -- patient received high dose of IV Dilaudid. Dropped his blood pressure down in the 70s earlier. -- Response to verbal commands. Received one liter of IV fluid. Blood pressure now improved. IV pain meds for today  -- hold sedating meds as  well  -- RN Norco if patient has significant pain else use Tylenol   (HFpEF) heart failure with preserved ejection fraction (HCC) --2D ECHO 07/2019 w/ EF 60-65% and grade 1 DD  --Mildly decompensated in setting of hypoxia  --BNP 115  --CXR stable, though w/ persistent LE swelling   Fall at home, initial encounter --Mechanical fall at home w/ noted secondary L2 vertebral fracture  --Baseline gait instability --Pain control  --PT/OT evaluation    History of normal pressure hydrocephalus S/p VP shunt. CT head stable    Sleep apnea CPAP    Acid reflux PPI   Hyperlipidemia Cont statin    HTN (hypertension) BP stable  Titrate home regimen    Uncontrolled type 2 diabetes mellitus with hyperglycemia, with long-term current use of insulin (HCC) SSI + semglee    Procedures: Family communication :wife at bedisde Consults : neurosurgery CODE STATUS: full DVT Prophylaxis : enoxaparin Level of care: Telemetry Medical Status is: Inpatient Remains inpatient appropriate because: L2 vertebral fracture, hypotension, pain management    TOTAL TIME TAKING CARE OF THIS PATIENT: 35 minutes.  >50% time spent on counselling and coordination of care  Note: This dictation was prepared with Dragon dictation along with smaller phrase technology. Any transcriptional errors that result from this process are unintentional.  Enedina Finner M.D    [facility]   CC: Primary care physician; Lorrine Kin, MD

## 2022-11-10 NOTE — Significant Event (Signed)
Rapid Response Event Note   Reason for Call :  Hypotension, diaphoresis  Initial Focused Assessment:  Rapid response RN arrived in patient's room with patient in bed surrounded by 1A staff. Patient's wife and Dr. Allena Katz arrived shortly after rapid response team to bedside. Patient profoundly diaphoretic. Per 1A staff BP had been fluctuating between 100s systolic and 80s systolic. Eyes open, reported feeling light headed, able to answer questions but appeared to just be staring off into distance when not interacting with staff. Patient had received 4 mg of IV dilaudid and 12.5 of coreg at 08:19. Per patient's wife patient had similar diaphoresis episode in ED but without BP issues. Vital signs upon rapid response arrival were BP 100/74 MAP 82, HR 97 regular, oxygen saturations 97% on 3.5 L nasal cannula. CBG 167. Patient's pain reported to be around a 5 but per wife patient has chronic pain and it is usually around 4 to 5 outpatient.  Interventions:  Dr. Allena Katz ordered 500 mL bolus of IV fluids. Assisted 1A staff with changing patient's sheets.  Plan of Care:  Patient to remain on 1A for now. Dr. Allena Katz reviewing patient's orders and making adjustments as needed.  Event Summary:   MD Notified: Dr. Allena Katz Call Time: 09:30 Arrival Time: 09:31 End Time: 09:53  Alleyne Lac, Theresia Lo, RN

## 2022-11-10 NOTE — Progress Notes (Signed)
  Chaplain On-Call responded to Rapid Response notification at 0933 hours.  The medical team was providing care for the patient at the bedside.  Chaplain assured Staff of further availability as needed.  Chaplain Evelena Peat M.Div., [facility]

## 2022-11-10 NOTE — Progress Notes (Addendum)
PT Cancellation Note  Patient Details Name: Nathan Dean MRN: [id number] DOB: 03-02-1956   Cancelled Treatment:    Reason Eval/Treat Not Completed: Medical issues which prohibited therapy;Fatigue/lethargy limiting ability to participate Pt with rapid response called earlier this AM, returned ~11:45 to check on status and pt continues to be essentially unarousable, per nursing not close to being appropriate from PT yet.  Will maintain on caseload and plan to treat when appropriate.  Spoke with wife, BRW and back brace in room.    Nathan Dean, DPT 11/10/2022, 12:08 PM

## 2022-11-10 NOTE — Evaluation (Addendum)
Physical Therapy Evaluation Patient Details Name: Nathan Dean MRN: [id number] DOB: [birth year] Today's Date: 11/10/2022  History of Present Illness  67 y.o. male with past medical history of obesity, type 2 diabetes, hydrocephalus status post VP shunt, hypertension who presents because of a fall.  Patient was getting out of bed this morning to go to the bathroom when his right foot tripped him up causing him to fall. Imaging reveals L2 fracture, per neuro consult will be treated non-surgically.  Clinical Impression  Pt still with some confusion and AMS, but medically stable and eager to try working with PT.  He showed good effort but was functionally limited.  Pt is 6'9", needed bed elevated ~6" to have a chance at standing, did ultimately do so X 2 with +2 minA and plenty of cuing and set up.  He has some baseline weakness, neuropathy and balance issues and these were evident in standing showing inability to keep weighty forward on the walker, leaning back of LEs on bed and poor activity tolerance with the mobility/standing tasks.  Discussed with pt and wife possible need for equipment (lift chair, hospital bed, etc) should he go home, does not appear safe to do so at this time.  Pt will benefit from continued PT to address functional limitations and facilitate appropriate d/c.       Recommendations for follow up therapy are one component of a multi-disciplinary discharge planning process, led by the attending physician.  Recommendations may be updated based on patient status, additional functional criteria and insurance authorization.  Follow Up Recommendations Can patient physically be transported by private vehicle: No     Assistance Recommended at Discharge Frequent or constant Supervision/Assistance  Patient can return home with the following  Two people to help with walking and/or transfers;A lot of help with bathing/dressing/bathroom;Assistance with cooking/housework;Assist for  transportation;Help with stairs or ramp for entrance    Equipment Recommendations  (TBD)  Recommendations for Other Services       Functional Status Assessment Patient has had a recent decline in their functional status and demonstrates the ability to make significant improvements in function in a reasonable and predictable amount of time.     Precautions / Restrictions Precautions Precautions: Fall;Back Required Braces or Orthoses: Spinal Brace Spinal Brace: Lumbar corset;Applied in sitting position Restrictions Weight Bearing Restrictions: No      Mobility  Bed Mobility Overal bed mobility: Needs Assistance Bed Mobility: Supine to Sit, Sit to Supine     Supine to sit: Min assist, HOB elevated Sit to supine: Max assist, +2 for physical assistance   General bed mobility comments: Pt showed good effort with transition to sitting EOB, cues for appropriate spinal protection and transition strategies.  Pt struggled getting back into bed ultimately needing heavy assist with both LEs and trunk to get back to supine    Transfers Overall transfer level: Needs assistance Equipment used: Rolling walker (2 wheels) Transfers: Sit to/from Stand Sit to Stand: +2 physical assistance, From elevated surface, Min assist           General transfer comment: elevated bed ~6", pt unable to rise initially.  Able to scoot to side enough to be able to utilize rail effectively and did attain standing 2 seperate times with heavy cuing and +2 minA    Ambulation/Gait               General Gait Details: attemtped a few small side steps along EOB, pt leaning back of LEs on bed and  unable to shift weight forward or unweight enough to actually step.  Limited mobility with poor safety awareness.  Stairs            Wheelchair Mobility    Modified Rankin (Stroke Patients Only)       Balance Overall balance assessment: Needs assistance Sitting-balance support: Bilateral upper  extremity supported Sitting balance-Leahy Scale: Fair     Standing balance support: During functional activity, Bilateral upper extremity supported Standing balance-Leahy Scale: Poor Standing balance comment: leaning back of LEs on bed to maintain upright, unable to effectively maintain weight over walker despite cuing and adjustments                             Pertinent Vitals/Pain Pain Assessment Pain Assessment: 0-10 Pain Score: 7  Pain Location: low back    Home Living Family/patient expects to be discharged to:: Private residence Living Arrangements: Spouse/significant other Available Help at Discharge: Available 24 hours/day;Family Type of Home: House Home Access: Stairs to enter Entrance Stairs-Rails: Right;Left;Can reach both Entrance Stairs-Number of Steps: 3   Home Layout: One level Home Equipment: Rollator (4 wheels);Cane - quad;Toilet riser;Other (comment);Rolling Walker (2 wheels);Grab bars - tub/shower;Grab bars - toilet;Shower seat (has temporary bed rails; may have BSC, wife to check)      Prior Function Prior Level of Function : Independent/Modified Independent ("about 15" falls in the last 6 months)             Mobility Comments: Ambulate with assistance with walker in room, cannot get up from low surfaces, need to raise bed to get up ADLs Comments: Pt able to go to bathroom, get dressed, help with pet management but wife does housework, etc     Hand Dominance        Extremity/Trunk Assessment   Upper Extremity Assessment Upper Extremity Assessment: Generalized weakness (R shoulder limited elevation 2/2 RTC issues otherwise WNL, L UE grossly 4-/5)    Lower Extremity Assessment Lower Extremity Assessment: Generalized weakness (chronic)       Communication   Communication: No difficulties  Cognition Arousal/Alertness: Awake/alert Behavior During Therapy: WFL for tasks assessed/performed Overall Cognitive Status: Impaired/Different  from baseline                                 General Comments: pt does have some baseline short term memory issues, but is still not fully recovered from repid response and subsequent lethargy from this AM.  Unable to get day/month/year, knew hospital but not location or events leading to arrival, able to follow simple instructions but, per wife, still not at baseline at all        General Comments General comments (skin integrity, edema, etc.): Pt still somewhat "out of it" from this mornings episode but medically stable enough to attempt PT.  Regardless quite limited with regard to mobility and showing ability to safely return home.    Exercises     Assessment/Plan    PT Assessment Patient needs continued PT services  PT Problem List Decreased range of motion;Decreased strength;Decreased activity tolerance;Decreased balance;Decreased mobility;Decreased knowledge of use of DME;Decreased safety awareness;Pain;Decreased knowledge of precautions;Obesity       PT Treatment Interventions DME instruction;Gait training;Stair training;Functional mobility training;Therapeutic activities;Therapeutic exercise;Balance training;Neuromuscular re-education;Patient/family education;Cognitive remediation    PT Goals (Current goals can be found in the Care Plan section)  Acute Rehab PT Goals Patient Stated  Goal: get moving better and go home PT Goal Formulation: With patient/family Time For Goal Achievement: 11/23/22 Potential to Achieve Goals: Good    Frequency Min 4X/week     Co-evaluation               AM-PAC PT "6 Clicks" Mobility  Outcome Measure Help needed turning from your back to your side while in a flat bed without using bedrails?: A Little Help needed moving from lying on your back to sitting on the side of a flat bed without using bedrails?: A Lot Help needed moving to and from a bed to a chair (including a wheelchair)?: A Lot Help needed standing up from a  chair using your arms (e.g., wheelchair or bedside chair)?: A Lot Help needed to walk in hospital room?: A Lot Help needed climbing 3-5 steps with a railing? : Total 6 Click Score: 12    End of Session Equipment Utilized During Treatment: Gait belt Activity Tolerance: Patient limited by pain;Patient limited by lethargy;Patient limited by fatigue Patient left: with bed alarm set;with call bell/phone within reach;with family/visitor present Nurse Communication: Mobility status PT Visit Diagnosis: Muscle weakness (generalized) (M62.81);Difficulty in walking, not elsewhere classified (R26.2)    Time: 1324-4010 PT Time Calculation (min) (ACUTE ONLY): 42 min   Charges:   PT Evaluation $PT Eval Moderate Complexity: 1 Mod PT Treatments $Therapeutic Activity: 8-22 mins        Malachi Pro, DPT 11/10/2022, 5:07 PM

## 2022-11-10 NOTE — Progress Notes (Signed)
OT Cancellation Note  Patient Details Name: Nathan Dean MRN: [id number] DOB: [birth year]   Cancelled Treatment:    Reason Eval/Treat Not Completed: Other (comment)Attempted to see patient for OT evaluation, pt is disoriented upon arrival. Oriented to self only. LSO has not yet been delivered.  Spo2 83% on 2L, BP 85/57 (MAP 66), temp 98.6, pluse 98; spo2 up to 94% on 4L with nurse in room to assess. Pt oriented to self, place, not oriented to situation or date after improvements in spo2. OT will hold at this time, reattempt when pt is medically ready and LSO has been delivered.   Oleta Mouse, OTD OTR/L  11/10/22, 9:24 AM

## 2022-11-11 DIAGNOSIS — S32020A Wedge compression fracture of second lumbar vertebra, initial encounter for closed fracture: Secondary | ICD-10-CM | POA: Diagnosis not present

## 2022-11-11 LAB — GLUCOSE, CAPILLARY: Glucose-Capillary: 238 mg/dL — ABNORMAL HIGH (ref 70–99)

## 2022-11-11 MED ORDER — DULOXETINE HCL 60 MG PO CPEP: 60.0000 mg | ORAL_CAPSULE | Freq: Every day | ORAL | 0 refills | Status: DC

## 2022-11-11 MED ORDER — INSULIN GLARGINE-YFGN 100 UNIT/ML ~~LOC~~ SOLN
25.0000 [IU] | Freq: Every day | SUBCUTANEOUS | Status: DC
  Filled 2022-11-11: qty 0.25

## 2022-11-11 MED ORDER — HYDROCODONE-ACETAMINOPHEN 5-325 MG PO TABS: 1.0000 | ORAL_TABLET | Freq: Four times a day (QID) | ORAL | 0 refills | Status: DC | PRN

## 2022-11-11 NOTE — Progress Notes (Signed)
Physical Therapy Treatment Patient Details Name: Nathan Dean MRN: [id number] DOB: 1955/05/26 Today's Date: 11/11/2022   History of Present Illness Pt is a 67 year old male admitted with with L2 compression fracture. PMH significant for morbid obesity, peripheral neuropathy, type 2 diabetes, history of hydrocephalus status post shunting, history of renal cell cancer status posttreatment, hypertension, OSA, depression, chronic pain presenting with fall, L2 vertebral fracture, hypoxia.Of note, rapid response called 6/26 due to mental status/lethargic the setting of low blood pressure.    PT Comments    Pt with resolution of cognitive issues present yesterday and was much better able to interact, follow cues and generally to participate with PT.  He was able to rise to standing multiple times w/o assist from standard height bari chair (did need assist on first attempt).  He was also able to do multiple bouts of walking with the longest being ~175 ft with BRW and consistent cadence; pt also negotiated up/down 4 steps w/ w/o rails and did not need any physical assist.  Pt reports continued expected low back and L hip pain but overall showed great improvement from a mobility and safety stand point and showed ability to safely transition home.  Pt will benefit from continued PT at d/c to address further functional and home management considerations.     Recommendations for follow up therapy are one component of a multi-disciplinary discharge planning process, led by the attending physician.  Recommendations may be updated based on patient status, additional functional criteria and insurance authorization.  Follow Up Recommendations  Can patient physically be transported by private vehicle: No    Assistance Recommended at Discharge Frequent or constant Supervision/Assistance  Patient can return home with the following Two people to help with walking and/or transfers;A lot of help with  bathing/dressing/bathroom;Assistance with cooking/housework;Assist for transportation;Help with stairs or ramp for entrance   Equipment Recommendations  None recommended by PT (wife has rented a lift chair)    Recommendations for Other Services       Precautions / Restrictions Precautions Precautions: Fall;Back Required Braces or Orthoses: Spinal Brace Spinal Brace: Lumbar corset;Applied in sitting position Restrictions Weight Bearing Restrictions: No     Mobility  Bed Mobility               General bed mobility comments: in recliner pre/post session    Transfers Overall transfer level: Needs assistance Equipment used: Rolling walker (2 wheels) Transfers: Sit to/from Stand Sit to Stand: Min guard, Min assist           General transfer comment: from bari recliner pt needed minA to rise on first attempt, but was able to rise w/o assist at least 2 more times w/o direct assist.  Despite cuing he did have some issues with controlling descent into lower height chair (will have lift recliner at home)    Ambulation/Gait Ambulation/Gait assistance: Supervision Gait Distance (Feet): 175 Feet Assistive device: Rolling walker (2 wheels)         General Gait Details: Pt was much more confident and safe with standing/ambulation today.  He was able to walk ~30ft on the first attempts, did a few smaller walks associated with to/from stairs and then managed ~119ft back to the room with relatively good safely and confidence.  Pt continues to display some mild impulsivity but did not have any LOBs or overt safety issues.  He did have some fatigue with the efforts but SpO2 (when reading correctly) seemed to stay in the 90s and HR in  the 90-100 range with activity   Stairs Stairs: Yes Stairs assistance: Supervision Stair Management: Two rails Number of Stairs: 4 General stair comments: Pt was able to negotiate up/down 4 steps with b/l rails and minimal cuing for appropriate  sequencing and safety considerations.   Wheelchair Mobility    Modified Rankin (Stroke Patients Only)       Balance Overall balance assessment: Needs assistance Sitting-balance support: Bilateral upper extremity supported Sitting balance-Leahy Scale: Good     Standing balance support: During functional activity, Bilateral upper extremity supported Standing balance-Leahy Scale: Fair Standing balance comment: Pt much better able to shift weight forward over walker and maintain appropriate weight shifts.  Occasional impulsivity with no LOBs during functional tasks.                            Cognition Arousal/Alertness: Awake/alert Behavior During Therapy: WFL for tasks assessed/performed, Impulsive Overall Cognitive Status: Within Functional Limits for tasks assessed Area of Impairment:  (pt much improved from yesterday, with need for repeat cuing at times essentially back to baseline)                                        Exercises      General Comments General comments (skin integrity, edema, etc.): vss throughout      Pertinent Vitals/Pain Pain Assessment Pain Score: 6  Pain Location: L hip, low back, neck    Home Living Family/patient expects to be discharged to:: Private residence Living Arrangements: Spouse/significant other Available Help at Discharge: Available 24 hours/day;Family Type of Home: House Home Access: Stairs to enter Entrance Stairs-Rails: Right;Left;Can reach both Entrance Stairs-Number of Steps: 3   Home Layout: One level Home Equipment: Rollator (4 wheels);Cane - quad;Toilet riser;Other (comment);Rolling Walker (2 wheels);Grab bars - tub/shower;Grab bars - toilet;Shower seat;BSC/3in1 (temporary bed rails, is renting a lift chair)      Prior Function            PT Goals (current goals can now be found in the care plan section) Progress towards PT goals: Progressing toward goals    Frequency    Min  4X/week      PT Plan Discharge plan needs to be updated    Co-evaluation              AM-PAC PT "6 Clicks" Mobility   Outcome Measure  Help needed turning from your back to your side while in a flat bed without using bedrails?: A Little Help needed moving from lying on your back to sitting on the side of a flat bed without using bedrails?: A Little Help needed moving to and from a bed to a chair (including a wheelchair)?: A Little Help needed standing up from a chair using your arms (e.g., wheelchair or bedside chair)?: A Little Help needed to walk in hospital room?: A Little Help needed climbing 3-5 steps with a railing? : A Little 6 Click Score: 18    End of Session Equipment Utilized During Treatment: Gait belt Activity Tolerance: Patient limited by pain;Patient limited by lethargy;Patient limited by fatigue Patient left: with call bell/phone within reach;with chair alarm set Nurse Communication: Mobility status PT Visit Diagnosis: Muscle weakness (generalized) (M62.81);Difficulty in walking, not elsewhere classified (R26.2)     Time: 1478-2956 PT Time Calculation (min) (ACUTE ONLY): 29 min  Charges:  $Gait Training: 8-22 mins $  Therapeutic Activity: 8-22 mins                     Malachi Pro, DPT 11/11/2022, 11:23 AM

## 2022-11-11 NOTE — Plan of Care (Signed)
  Problem: Education: Goal: Ability to demonstrate management of disease process will improve Outcome: Progressing   Problem: Activity: Goal: Capacity to carry out activities will improve Outcome: Progressing   Problem: Education: Goal: Knowledge of General Education information will improve Description Including pain rating scale, medication(s)/side effects and non-pharmacologic comfort measures Outcome: Progressing   Problem: Clinical Measurements: Goal: Ability to maintain clinical measurements within normal limits will improve Outcome: Progressing   

## 2022-11-11 NOTE — Discharge Summary (Signed)
Physician Discharge Summary   Patient: Nathan Dean MRN: [id number] DOB: March 16, 1956  Admit date:     11/09/2022  Discharge date: 11/11/22  Discharge Physician: Enedina Finner   PCP: Lorrine Kin, MD   Recommendations at discharge:   follow-up PCP in 1 to 2 weeks follow-up Dr. Myer Haff neurosurgery in 10 to 14 days  Discharge Diagnoses: Principal Problem:   L2 vertebral fracture [facility]) Active Problems:   Hypoxia   Fall at home, initial encounter   (HFpEF) heart failure with preserved ejection fraction (HCC)   Uncontrolled type 2 diabetes mellitus with hyperglycemia, with long-term current use of insulin (HCC)   HTN (hypertension)   Hyperlipidemia   Acid reflux   Sleep apnea   History of hydrocephalus  Nathan Dean is a 67 y.o. male with medical history significant of multiple medical issues including morbid obesity, peripheral neuropathy, type 2 diabetes, history of hydrocephalus status post shunting, history of renal cell cancer status posttreatment, hypertension, OSA, depression, chronic pain presenting with fall, L2 vertebral fracture, hypoxia.  Patient reports attempting to get out of bed earlier in the day.  Has difficulty with ambulation in the setting of neuropathy and foot drop, uses a walker.  Subsequently fell landing on his left side.  No reported head trauma loss consciousness.      CT C-spine, CT chest, CT L-spine grossly stable apart from noted L2 vertebral fracture.    L2 vertebral fracture (HCC) S/p mechanical fall at home with h/o neuropathy and foot drop --L2 vertebral compression fracture status post fall  --Dr. Marcell Barlow with neurosurgery recommends PT/OT, use LSO brace --Fall precautions --seen by PT-- did fairly well. Will arrange home health PT OT. Patient and wife agreeable.   acute metabolic encephalopathy with hypotension suspected opioid induced -- patient received high dose of IV Dilaudid. Dropped his blood pressure down in the 70s  earlier. -- Response to verbal commands. Received one liter of IV fluid. Blood pressure now improved. IV pain meds for today  -- hold sedating meds as well  -- RN Norco if patient has significant pain else use Tylenol  --now resolved --bp stable. Pt back to baseline   (HFpEF) heart failure with preserved ejection fraction (HCC) --2D ECHO 07/2019 w/ EF 60-65% and grade 1 DD  --Mildly decompensated in setting of hypoxia  --BNP 115  --CXR stable, though w/ persistent LE swelling   Fall at home, initial encounter --Mechanical fall at home w/ noted secondary L2 vertebral fracture  --Baseline gait instability --Pain control  --PT/OT evaluation appreciated   History of normal pressure hydrocephalus S/p VP shunt. CT head stable    Sleep apnea CPAP    Acid reflux PPI   Hyperlipidemia Cont statin    HTN (hypertension) BP stable  Titrate home regimen    Uncontrolled type 2 diabetes mellitus with hyperglycemia, with long-term current use of insulin (HCC) resume diabetes meds as before.   Discharge to home with outpatient follow-up neurosurgery. Patient and wife agreeable    Family communication :wife at bedisde Consults : neurosurgery CODE STATUS: full DVT Prophylaxis : enoxaparin     Pain control - [place] Controlled Substance Reporting System database was reviewed. and patient was instructed, not to drive, operate heavy machinery, perform activities at heights, swimming or participation in water activities or provide baby-sitting services while on Pain, Sleep and Anxiety Medications; until their outpatient Physician has advised to do so again. Also recommended to not to take more than prescribed Pain, Sleep and Anxiety Medications.  Diet recommendation:  Discharge Diet Orders (From admission, onward)     Start     Ordered   11/11/22 0000  Diet - low sodium heart healthy        11/11/22 1152           Cardiac and Carb modified diet DISCHARGE  MEDICATION: Allergies as of 11/11/2022       Reactions   Penicillins Anaphylaxis, Rash, Other (See Comments)   Succinylcholine Chloride Anaphylaxis, Rash   Semaglutide Diarrhea   Keflex [cephalexin] Rash   Sulfa Antibiotics Rash, Other (See Comments)        Medication List     STOP taking these medications    cyclobenzaprine 10 MG tablet Commonly known as: FLEXERIL   Semglee (yfgn) 100 UNIT/ML Pen Generic drug: insulin glargine-yfgn       TAKE these medications    albuterol 108 (90 Base) MCG/ACT inhaler Commonly known as: VENTOLIN HFA Inhale 2 puffs into the lungs every 4 (four) hours as needed.   aspirin EC 81 MG tablet Take 81 mg by mouth daily.   buPROPion 300 MG 24 hr tablet Commonly known as: WELLBUTRIN XL Take 450 mg by mouth daily.   carvedilol 12.5 MG tablet Commonly known as: COREG Take 1 tablet (12.5 mg total) by mouth 2 (two) times daily with a meal.   cholecalciferol 25 MCG (1000 UNIT) tablet Commonly known as: VITAMIN D3 Take 2,000 Units by mouth daily.   cyanocobalamin 1000 MCG tablet Commonly known as: VITAMIN B12 Take 5,000 mcg by mouth daily.   donepezil 10 MG tablet Commonly known as: ARICEPT Take 10 mg by mouth at bedtime.   DULoxetine 60 MG capsule Commonly known as: CYMBALTA Take 1 capsule (60 mg total) by mouth daily. Start taking on: November 12, 2022 What changed:  medication strength how much to take additional instructions   gabapentin 300 MG capsule Commonly known as: NEURONTIN Take 3 capsules (900 mg total) by mouth 3 (three) times daily.   HumaLOG 100 UNIT/ML injection Generic drug: insulin lispro Inject 18 Units into the skin in the morning and at bedtime. 18 in am. 8 in pm   HYDROcodone-acetaminophen 5-325 MG tablet Commonly known as: NORCO/VICODIN Take 1 tablet by mouth every 6 (six) hours as needed for moderate pain.   insulin glargine 100 UNIT/ML injection Commonly known as: LANTUS Inject 30 Units into the  skin daily.   ketoconazole 2 % cream Commonly known as: NIZORAL Apply 1 Application topically 2 (two) times daily.   loratadine 10 MG tablet Commonly known as: CLARITIN Take 10 mg by mouth daily as needed.   losartan 100 MG tablet Commonly known as: COZAAR Take 1 tablet (100 mg total) by mouth daily.   magnesium oxide 400 (240 Mg) MG tablet Commonly known as: MAG-OX Take 500 mg by mouth daily.   metFORMIN 500 MG tablet Commonly known as: GLUCOPHAGE Take 1,000 mg by mouth 2 (two) times daily with a meal.   methocarbamol 750 MG tablet Commonly known as: ROBAXIN Take 750 mg by mouth 4 (four) times daily.   mometasone 0.1 % cream Commonly known as: ELOCON Apply 1 Application topically daily.   omeprazole 20 MG capsule Commonly known as: PRILOSEC Take 20 mg by mouth daily.   ondansetron 4 MG disintegrating tablet Commonly known as: ZOFRAN-ODT Take 8 mg by mouth every 8 (eight) hours as needed for nausea.   rosuvastatin 20 MG tablet Commonly known as: CRESTOR Take 20 mg by mouth daily.  senna-docusate 8.6-50 MG tablet Commonly known as: Senokot-S Take 2 tablets by mouth daily.   tirzepatide 2.5 MG/0.5ML Pen Commonly known as: MOUNJARO Inject 2.5 mg into the skin once a week.   traZODone 50 MG tablet Commonly known as: DESYREL Take 1 tablet by mouth at bedtime.        Follow-up Information     Lorrine Kin, MD. Schedule an appointment as soon as possible for a visit.   Specialty: Family Medicine Contact information: [address] [facility] [address] [place] [address] [phone]         Venetia Night, MD. Schedule an appointment as soon as possible for a visit.   Specialty: Neurosurgery Why: L2 vertebral compression fracture Contact information: [address] [place] [address] [phone]                Discharge Exam: Ceasar Mons Weights   11/09/22 0640 11/10/22 0701 11/11/22 0500  Weight: (!)  154.4 kg (!) 159.6 kg (!) 160.4 kg  GENERAL:  67 y.o.-year-old patient with no acute distress. Obese LUNGS: Normal breath sounds bilaterally, no wheezing CARDIOVASCULAR: S1, S2 normal. No murmur   ABDOMEN: Soft, nontender, nondistended. Bowel sounds present.  EXTREMITIES: No  edema b/l. Chronic foot drop NEUROLOGIC: nonfocal  patient is alert Neuropathy+, foot drop   Condition at discharge: fair  The results of significant diagnostics from this hospitalization (including imaging, microbiology, ancillary and laboratory) are listed below for reference.   Imaging Studies: ECHOCARDIOGRAM COMPLETE  Result Date: 11/10/2022    ECHOCARDIOGRAM REPORT   Patient Name:   STRAN RAPER Date of Exam: 11/09/2022 Medical Rec #:  [id number]       Height:       81.0 in Accession #:    [id number]      Weight:       340.3 lb Date of Birth:  [birth year]        BSA:          2.908 m Patient Age:    67 years        BP:           149/77 mmHg Patient Gender: M               HR:           68 bpm. Exam Location:  [facility] Procedure: 2D Echo, Cardiac Doppler and Color Doppler Indications:     CHF-acute diastolic I50.31  History:         Patient has prior history of Echocardiogram examinations, most                  recent 08/12/2019. Risk Factors:Diabetes, Hypertension and Sleep                  Apnea.  Sonographer:     Cristela Blue Referring Phys:  [id number] Francoise Schaumann [place] Diagnosing Phys: Yvonne Kendall MD IMPRESSIONS  1. Left ventricular ejection fraction, by estimation, is 55 to 60%. The left ventricle has normal function. The left ventricle demonstrates regional wall motion abnormalities (see scoring diagram/findings for description). There is moderate left ventricular hypertrophy. Left ventricular diastolic parameters are consistent with Grade I diastolic dysfunction (impaired relaxation). There is mild hypokinesis of the left ventricular, entire inferior wall.  2. Right ventricular systolic function is normal. The right ventricular  size is normal. Tricuspid regurgitation signal is inadequate for assessing PA pressure.  3. The mitral valve is degenerative. Trivial mitral valve regurgitation. No evidence of  mitral stenosis.  4. The aortic valve has an indeterminant number of cusps. There is mild calcification of the aortic valve. There is moderate thickening of the aortic valve. Aortic valve regurgitation is not visualized. Aortic valve sclerosis/calcification is present, without any evidence of aortic stenosis. FINDINGS  Left Ventricle: Left ventricular ejection fraction, by estimation, is 55 to 60%. The left ventricle has normal function. The left ventricle demonstrates regional wall motion abnormalities. Mild hypokinesis of the left ventricular, entire inferior wall. The left ventricular internal cavity size was normal in size. There is moderate left ventricular hypertrophy. Left ventricular diastolic parameters are consistent with Grade I diastolic dysfunction (impaired relaxation). Right Ventricle: The right ventricular size is normal. No increase in right ventricular wall thickness. Right ventricular systolic function is normal. Tricuspid regurgitation signal is inadequate for assessing PA pressure. Left Atrium: Left atrial size was normal in size. Right Atrium: Right atrial size was normal in size. Pericardium: The pericardium was not well visualized. Mitral Valve: The mitral valve is degenerative in appearance. There is mild thickening of the mitral valve leaflet(s). There is mild calcification of the mitral valve leaflet(s). Mild mitral annular calcification. Trivial mitral valve regurgitation. No evidence of mitral valve stenosis. MV peak gradient, 3.6 mmHg. The mean mitral valve gradient is 2.0 mmHg. Tricuspid Valve: The tricuspid valve is normal in structure. Tricuspid valve regurgitation is trivial. Aortic Valve: The aortic valve has an indeterminant number of cusps. There is mild calcification of the aortic valve. There is moderate  thickening of the aortic valve. Aortic valve regurgitation is not visualized. Aortic valve sclerosis/calcification is present, without any evidence of aortic stenosis. Aortic valve mean gradient measures 9.0 mmHg. Aortic valve peak gradient measures 15.2 mmHg. Aortic valve area, by VTI measures 1.62 cm. Pulmonic Valve: The pulmonic valve was not well visualized. Pulmonic valve regurgitation is not visualized. No evidence of pulmonic stenosis. Aorta: The aortic root is normal in size and structure. Pulmonary Artery: The pulmonary artery is not well seen. IAS/Shunts: The interatrial septum was not well visualized.  LEFT VENTRICLE PLAX 2D LVIDd:         4.30 cm   Diastology LVIDs:         2.90 cm   LV e' medial:    5.66 cm/s LV PW:         1.50 cm   LV E/e' medial:  11.0 LV IVS:        1.40 cm   LV e' lateral:   7.51 cm/s LVOT diam:     2.10 cm   LV E/e' lateral: 8.3 LV SV:         64 LV SV Index:   22 LVOT Area:     3.46 cm  RIGHT VENTRICLE RV Basal diam:  3.00 cm RV Mid diam:    3.70 cm RV S prime:     15.20 cm/s TAPSE (M-mode): 2.7 cm LEFT ATRIUM           Index        RIGHT ATRIUM           Index LA diam:      4.30 cm 1.48 cm/m   RA Area:     14.70 cm LA Vol (A2C): 48.2 ml 16.58 ml/m  RA Volume:   35.40 ml  12.17 ml/m LA Vol (A4C): 82.1 ml 28.23 ml/m  AORTIC VALVE AV Area (Vmax):    1.60 cm AV Area (Vmean):   1.32 cm AV Area (VTI):  1.62 cm AV Vmax:           195.00 cm/s AV Vmean:          137.333 cm/s AV VTI:            0.396 m AV Peak Grad:      15.2 mmHg AV Mean Grad:      9.0 mmHg LVOT Vmax:         90.20 cm/s LVOT Vmean:        52.300 cm/s LVOT VTI:          0.185 m LVOT/AV VTI ratio: 0.47  AORTA Ao Root diam: 3.10 cm MITRAL VALVE               TRICUSPID VALVE MV Area (PHT): 2.56 cm    TR Peak grad:   22.5 mmHg MV Area VTI:   2.35 cm    TR Vmax:        237.00 cm/s MV Peak grad:  3.6 mmHg MV Mean grad:  2.0 mmHg    SHUNTS MV Vmax:       0.95 m/s    Systemic VTI:  0.18 m MV Vmean:      66.5 cm/s    Systemic Diam: 2.10 cm MV Decel Time: 296 msec MV E velocity: 62.40 cm/s MV A velocity: 98.50 cm/s MV E/A ratio:  0.63 Christopher End MD Electronically signed by Yvonne Kendall MD Signature Date/Time: 11/10/2022/9:58:30 AM    Final    US Venous Img Lower Bilateral (DVT)  Result Date: 11/09/2022 CLINICAL DATA:  Lower extremity swelling EXAM: BILATERAL LOWER EXTREMITY VENOUS DOPPLER ULTRASOUND TECHNIQUE: Gray-scale sonography with graded compression, as well as color Doppler and duplex ultrasound were performed to evaluate the lower extremity deep venous systems from the level of the common femoral vein and including the common femoral, femoral, profunda femoral, popliteal and calf veins including the posterior tibial, peroneal and gastrocnemius veins when visible. Spectral Doppler was utilized to evaluate flow at rest and with distal augmentation maneuvers in the common femoral, femoral and popliteal veins. COMPARISON:  None Available. FINDINGS: RIGHT LOWER EXTREMITY Common Femoral Vein: No evidence of thrombus. Normal compressibility, respiratory phasicity and response to augmentation. Saphenofemoral Junction: No evidence of thrombus. Normal compressibility and flow on color Doppler imaging. Profunda Femoral Vein: No evidence of thrombus. Normal compressibility and flow on color Doppler imaging. Femoral Vein: No evidence of thrombus. Normal compressibility, respiratory phasicity and response to augmentation. Popliteal Vein: No evidence of thrombus. Normal compressibility, respiratory phasicity and response to augmentation. Calf Veins: No evidence of thrombus. Normal compressibility and flow on color Doppler imaging. LEFT LOWER EXTREMITY Common Femoral Vein: No evidence of thrombus. Normal compressibility, respiratory phasicity and response to augmentation. Saphenofemoral Junction: No evidence of thrombus. Normal compressibility and flow on color Doppler imaging. Profunda Femoral Vein: No evidence of thrombus.  Normal compressibility and flow on color Doppler imaging. Femoral Vein: No evidence of thrombus. Normal compressibility, respiratory phasicity and response to augmentation. Popliteal Vein: No evidence of thrombus. Normal compressibility, respiratory phasicity and response to augmentation. Calf Veins: No evidence of thrombus. Normal compressibility and flow on color Doppler imaging. IMPRESSION: No evidence of deep venous thrombosis in either lower extremity. Electronically Signed   By: Judie Petit.  Shick M.D.   On: 11/09/2022 10:49   DG Chest Portable 1 View  Result Date: 11/09/2022 CLINICAL DATA:  67 year old male status post fall.  Pain.  Hypoxia. EXAM: PORTABLE CHEST 1 VIEW COMPARISON:  Portable chest 10/18/2022 and earlier. FINDINGS: Portable  AP semi upright view at 0813 hours. Right anterior chest wall shunt catheter redemonstrated. Improved lung volumes. Normal cardiac size and mediastinal contours. Visualized tracheal air column is within normal limits. Allowing for portable technique the lungs are clear. No pneumothorax or pleural effusion. Paucity of bowel gas in the visible abdomen. No acute osseous abnormality identified. IMPRESSION: Negative portable chest; right chest chronic VP shunt. Electronically Signed   By: Odessa Fleming M.D.   On: 11/09/2022 08:22   CT Chest Wo Contrast  Result Date: 11/09/2022 CLINICAL DATA:  Fall EXAM: CT CHEST WITHOUT CONTRAST TECHNIQUE: Multidetector CT imaging of the chest was performed following the standard protocol without IV contrast. RADIATION DOSE REDUCTION: This exam was performed according to the departmental dose-optimization program which includes automated exposure control, adjustment of the mA and/or kV according to patient size and/or use of iterative reconstruction technique. COMPARISON:  CTA chest 05/23/2022 FINDINGS: Cardiovascular: The heart size is normal. There is no pericardial effusion. Coronary artery calcifications, mitral valve calcifications, and aortic  annular calcifications are again noted. The ascending thoracic aorta is mildly aneurysmal measuring up to 4.1 cm. Mediastinum/Nodes: The imaged thyroid is unremarkable. The esophagus is grossly unremarkable. There is no mediastinal, hilar, or axillary lymphadenopathy. Lungs/Pleura: The trachea and central airways are patent The lungs clear, with no focal consolidation or pulmonary edema. There is no pleural effusion or pneumothorax. There is no evidence of traumatic parenchymal injury. There are no suspicious nodules. Upper Abdomen: There is punctate nonobstructing right upper pole renal stone. The imaged portions of the upper abdominal viscera are otherwise unremarkable. Musculoskeletal: There is no acute rib fracture. There is no acute sternal fracture. The thoracic spine is assessed on the separately dictated CT thoracic spine. IMPRESSION: 1. Evidence of acute traumatic injury in the chest. 2. Coronary artery, aortic valve, and mitral annular calcifications. 3. 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 Recommend annual imaging follow-up. Electronically Signed   By: Lesia Hausen M.D.   On: 11/09/2022 08:14   CT T-SPINE NO CHARGE  Result Date: 11/09/2022 CLINICAL DATA:  Fall EXAM: CT THORACIC AND LUMBAR SPINE WITHOUT CONTRAST TECHNIQUE: Multidetector CT imaging of the thoracic and lumbar spine was performed without contrast. Multiplanar CT image reconstructions were also generated. RADIATION DOSE REDUCTION: This exam was performed according to the departmental dose-optimization program which includes automated exposure control, adjustment of the mA and/or kV according to patient size and/or use of iterative reconstruction technique. COMPARISON:  CTA chest, CT abdomen/pelvis 05/23/2022 FINDINGS: CT THORACIC SPINE FINDINGS Alignment: Normal. There is no evidence of traumatic malalignment. There is no significant antero or retrolisthesis. Vertebrae: Mild anterior wedge deformity of the T10  and T11 vertebral bodies is unchanged. Vertebral body heights are otherwise preserved, without evidence of acute fracture there is no suspicious osseous lesion. Paraspinal and other soft tissues: The paraspinal soft tissues are unremarkable. Disc levels: There is multilevel degenerative endplate change with right-sided endplate osteophytes. There is minimal facet arthropathy. There is no evidence of high-grade osseous spinal canal or neural foraminal stenosis. CT LUMBAR SPINE FINDINGS Segmentation: Standard; the lowest formed disc space is designated L5-S1. Alignment: Normal. There is no antero or retrolisthesis. There is no evidence of traumatic malalignment. Vertebrae: There is an acute compression fracture of the L2 vertebral body with fracture planes involving the anterior, superior, and inferior endplates there is no evidence of extension into the posterior elements. There is no bony retropulsion. There is up to proximally 20% loss of vertebral body height centrally.  The other vertebral body heights are preserved. There is no other evidence of acute fracture. There is no suspicious osseous lesion. Paraspinal and other soft tissues: Unremarkable. Disc levels: There is disc space narrowing and degenerative endplate change at L3-L4 through L5-S1. There is overall mild multilevel facet arthropathy. There is mild spinal canal stenosis and mild neural foraminal stenosis at L3-L4 through L5-S1. IMPRESSION: 1. Acute compression fracture of the L2 vertebral body resulting in up to approximately 20% loss of vertebral body height. No bony retropulsion or evidence of extension into the posterior elements. 2. No other acute fracture or traumatic malalignment of the thoracic or lumbar spine. Electronically Signed   By: Lesia Hausen M.D.   On: 11/09/2022 08:05   CT Lumbar Spine Wo Contrast  Result Date: 11/09/2022 CLINICAL DATA:  Fall EXAM: CT THORACIC AND LUMBAR SPINE WITHOUT CONTRAST TECHNIQUE: Multidetector CT imaging  of the thoracic and lumbar spine was performed without contrast. Multiplanar CT image reconstructions were also generated. RADIATION DOSE REDUCTION: This exam was performed according to the departmental dose-optimization program which includes automated exposure control, adjustment of the mA and/or kV according to patient size and/or use of iterative reconstruction technique. COMPARISON:  CTA chest, CT abdomen/pelvis 05/23/2022 FINDINGS: CT THORACIC SPINE FINDINGS Alignment: Normal. There is no evidence of traumatic malalignment. There is no significant antero or retrolisthesis. Vertebrae: Mild anterior wedge deformity of the T10 and T11 vertebral bodies is unchanged. Vertebral body heights are otherwise preserved, without evidence of acute fracture there is no suspicious osseous lesion. Paraspinal and other soft tissues: The paraspinal soft tissues are unremarkable. Disc levels: There is multilevel degenerative endplate change with right-sided endplate osteophytes. There is minimal facet arthropathy. There is no evidence of high-grade osseous spinal canal or neural foraminal stenosis. CT LUMBAR SPINE FINDINGS Segmentation: Standard; the lowest formed disc space is designated L5-S1. Alignment: Normal. There is no antero or retrolisthesis. There is no evidence of traumatic malalignment. Vertebrae: There is an acute compression fracture of the L2 vertebral body with fracture planes involving the anterior, superior, and inferior endplates there is no evidence of extension into the posterior elements. There is no bony retropulsion. There is up to proximally 20% loss of vertebral body height centrally. The other vertebral body heights are preserved. There is no other evidence of acute fracture. There is no suspicious osseous lesion. Paraspinal and other soft tissues: Unremarkable. Disc levels: There is disc space narrowing and degenerative endplate change at L3-L4 through L5-S1. There is overall mild multilevel facet  arthropathy. There is mild spinal canal stenosis and mild neural foraminal stenosis at L3-L4 through L5-S1. IMPRESSION: 1. Acute compression fracture of the L2 vertebral body resulting in up to approximately 20% loss of vertebral body height. No bony retropulsion or evidence of extension into the posterior elements. 2. No other acute fracture or traumatic malalignment of the thoracic or lumbar spine. Electronically Signed   By: Lesia Hausen M.D.   On: 11/09/2022 08:05   CT HEAD WO CONTRAST ( )  Result Date: 11/09/2022 CLINICAL DATA:  Fall EXAM: CT HEAD WITHOUT CONTRAST CT CERVICAL SPINE WITHOUT CONTRAST TECHNIQUE: Multidetector CT imaging of the head and cervical spine was performed following the standard protocol without intravenous contrast. Multiplanar CT image reconstructions of the cervical spine were also generated. RADIATION DOSE REDUCTION: This exam was performed according to the departmental dose-optimization program which includes automated exposure control, adjustment of the mA and/or kV according to patient size and/or use of iterative reconstruction technique. COMPARISON:  CT head 05/23/2022,  CT cervical spine 09/13/2020 FINDINGS: CT HEAD FINDINGS Brain: A right frontal ventricular catheter is in place terminating in the body of the right lateral ventricle near the midline. The ventricles are unchanged in size or configuration since 05/23/2022. There is no evidence of transependymal flow of CSF. There is no acute intracranial hemorrhage, extra-axial fluid collection, or acute infarct Gray-white differentiation is preserved. Hypodensity in the supratentorial white matter likely reflects underlying chronic small-vessel ischemic change. The pituitary and suprasellar region are normal. There is no mass lesion. There is no mass effect or midline shift. Vascular: No hyperdense vessel or unexpected calcification. Skull: Normal. Negative for fracture or focal lesion. Sinuses/Orbits: The imaged paranasal  sinuses are clear. Bilateral lens implants are in place. The globes and orbits are otherwise unremarkable. Other: The mastoid air cells and middle ear cavities are clear. CT CERVICAL SPINE FINDINGS Alignment: Normal. There is no antero or retrolisthesis. There is no jumped or perched facet or other evidence of traumatic malalignment. Skull base and vertebrae: Skull base alignment is maintained. Vertebral body heights are preserved. There is no evidence of acute fracture. There is no suspicious osseous lesion. Soft tissues and spinal canal: No prevertebral fluid or swelling. No visible canal hematoma. Disc levels: There is mild disc space narrowing and degenerative endplate change C5-C6 and C6-C7. There is overall mild facet arthropathy. There is no evidence of high-grade spinal canal or neural foraminal stenosis. Other: None. IMPRESSION: 1. No acute intracranial pathology. 2. Stable right frontal ventricular catheter with unchanged size and configuration of the ventricular system. 3. No acute fracture or traumatic malalignment of the cervical spine. Electronically Signed   By: Lesia Hausen M.D.   On: 11/09/2022 07:56   CT Cervical Spine Wo Contrast  Result Date: 11/09/2022 CLINICAL DATA:  Fall EXAM: CT HEAD WITHOUT CONTRAST CT CERVICAL SPINE WITHOUT CONTRAST TECHNIQUE: Multidetector CT imaging of the head and cervical spine was performed following the standard protocol without intravenous contrast. Multiplanar CT image reconstructions of the cervical spine were also generated. RADIATION DOSE REDUCTION: This exam was performed according to the departmental dose-optimization program which includes automated exposure control, adjustment of the mA and/or kV according to patient size and/or use of iterative reconstruction technique. COMPARISON:  CT head 05/23/2022, CT cervical spine 09/13/2020 FINDINGS: CT HEAD FINDINGS Brain: A right frontal ventricular catheter is in place terminating in the body of the right  lateral ventricle near the midline. The ventricles are unchanged in size or configuration since 05/23/2022. There is no evidence of transependymal flow of CSF. There is no acute intracranial hemorrhage, extra-axial fluid collection, or acute infarct Gray-white differentiation is preserved. Hypodensity in the supratentorial white matter likely reflects underlying chronic small-vessel ischemic change. The pituitary and suprasellar region are normal. There is no mass lesion. There is no mass effect or midline shift. Vascular: No hyperdense vessel or unexpected calcification. Skull: Normal. Negative for fracture or focal lesion. Sinuses/Orbits: The imaged paranasal sinuses are clear. Bilateral lens implants are in place. The globes and orbits are otherwise unremarkable. Other: The mastoid air cells and middle ear cavities are clear. CT CERVICAL SPINE FINDINGS Alignment: Normal. There is no antero or retrolisthesis. There is no jumped or perched facet or other evidence of traumatic malalignment. Skull base and vertebrae: Skull base alignment is maintained. Vertebral body heights are preserved. There is no evidence of acute fracture. There is no suspicious osseous lesion. Soft tissues and spinal canal: No prevertebral fluid or swelling. No visible canal hematoma. Disc levels: There is mild  disc space narrowing and degenerative endplate change C5-C6 and C6-C7. There is overall mild facet arthropathy. There is no evidence of high-grade spinal canal or neural foraminal stenosis. Other: None. IMPRESSION: 1. No acute intracranial pathology. 2. Stable right frontal ventricular catheter with unchanged size and configuration of the ventricular system. 3. No acute fracture or traumatic malalignment of the cervical spine. Electronically Signed   By: Lesia Hausen M.D.   On: 11/09/2022 07:56   DG Chest Portable 1 View  Result Date: 10/18/2022 CLINICAL DATA:  Fever.  Slip and fall in shower. EXAM: PORTABLE CHEST - 1 VIEW  COMPARISON:  05/23/2022 FINDINGS: Mild cardiomegaly and pulmonary vascular congestion. Shunt catheter tubing noted overlying the right chest. Lungs are otherwise clear. IMPRESSION: Mild cardiomegaly and pulmonary vascular congestion. Electronically Signed   By: Acquanetta Belling M.D.   On: 10/18/2022 13:56   US Venous Img Lower  Left (DVT Study)  Result Date: 10/18/2022 CLINICAL DATA:  Swelling and cramping Fall 1 week ago EXAM: LEFT LOWER EXTREMITY VENOUS DOPPLER ULTRASOUND TECHNIQUE: Gray-scale sonography with compression, as well as color and duplex ultrasound, were performed to evaluate the deep venous system(s) from the level of the common femoral vein through the popliteal and proximal calf veins. COMPARISON:  08/12/2019 FINDINGS: VENOUS Normal compressibility of the common femoral, superficial femoral, and popliteal veins, as well as the visualized calf veins. Visualized portions of profunda femoral vein and great saphenous vein unremarkable. No filling defects to suggest DVT on grayscale or color Doppler imaging. Doppler waveforms show normal direction of venous flow, normal respiratory plasticity and response to augmentation. Limited views of the contralateral common femoral vein are unremarkable. OTHER None. Limitations: Suboptimal visualization of left peroneal vein. IMPRESSION: No DVT of the left lower extremity. Electronically Signed   By: Acquanetta Belling M.D.   On: 10/18/2022 13:54    Microbiology: Results for orders placed or performed during the hospital encounter of 10/18/22  Resp panel by RT-PCR (RSV, Flu A&B, Covid) Anterior Nasal Swab     Status: None   Collection Time: 10/18/22 12:55 PM   Specimen: Anterior Nasal Swab  Result Value Ref Range Status   SARS Coronavirus 2 by RT PCR NEGATIVE NEGATIVE Final    Comment: (NOTE) SARS-CoV-2 target nucleic acids are NOT DETECTED.  The SARS-CoV-2 RNA is generally detectable in upper respiratory specimens during the acute phase of infection. The  lowest concentration of SARS-CoV-2 viral copies this assay can detect is 138 copies/mL. A negative result does not preclude SARS-Cov-2 infection and should not be used as the sole basis for treatment or other patient management decisions. A negative result may occur with  improper specimen collection/handling, submission of specimen other than nasopharyngeal swab, presence of viral mutation(s) within the areas targeted by this assay, and inadequate number of viral copies(<138 copies/mL). A negative result must be combined with clinical observations, patient history, and epidemiological information. The expected result is Negative.  Fact Sheet for Patients:  BloggerCourse.com  Fact Sheet for Healthcare Providers:  SeriousBroker.it  This test is no t yet approved or cleared by the [place] FDA and  has been authorized for detection and/or diagnosis of SARS-CoV-2 by FDA under an Emergency Use Authorization (EUA). This EUA will remain  in effect (meaning this test can be used) for the duration of the COVID-19 declaration under Section 564(b)(1) of the Act, 21 U.S.C.section 360bbb-3(b)(1), unless the authorization is terminated  or revoked sooner.       Influenza A by PCR NEGATIVE NEGATIVE  Final   Influenza B by PCR NEGATIVE NEGATIVE Final    Comment: (NOTE) The Xpert Xpress SARS-CoV-2/FLU/RSV plus assay is intended as an aid in the diagnosis of influenza from Nasopharyngeal swab specimens and should not be used as a sole basis for treatment. Nasal washings and aspirates are unacceptable for Xpert Xpress SARS-CoV-2/FLU/RSV testing.  Fact Sheet for Patients: BloggerCourse.com  Fact Sheet for Healthcare Providers: SeriousBroker.it  This test is not yet approved or cleared by the [place] FDA and has been authorized for detection and/or diagnosis of SARS-CoV-2 by FDA under  an Emergency Use Authorization (EUA). This EUA will remain in effect (meaning this test can be used) for the duration of the COVID-19 declaration under Section 564(b)(1) of the Act, 21 U.S.C. section 360bbb-3(b)(1), unless the authorization is terminated or revoked.     Resp Syncytial Virus by PCR NEGATIVE NEGATIVE Final    Comment: (NOTE) Fact Sheet for Patients: BloggerCourse.com  Fact Sheet for Healthcare Providers: SeriousBroker.it  This test is not yet approved or cleared by the [place] FDA and has been authorized for detection and/or diagnosis of SARS-CoV-2 by FDA under an Emergency Use Authorization (EUA). This EUA will remain in effect (meaning this test can be used) for the duration of the COVID-19 declaration under Section 564(b)(1) of the Act, 21 U.S.C. section 360bbb-3(b)(1), unless the authorization is terminated or revoked.  Performed at [facility], [address] [place], [place] [address]     Labs: CBC: Recent Labs  Lab 11/09/22 0743 11/10/22 0432  WBC 7.4 9.0  NEUTROABS 5.4  --   HGB 11.9* 13.0  HCT 39.3 44.2  MCV 87.5 90.9  PLT 180 225   Basic Metabolic Panel: Recent Labs  Lab 11/09/22 0743 11/10/22 0432  NA 136 136  K 3.9 4.1  CL 102 97*  CO2 26 28  GLUCOSE 184* 191*  BUN 19 25*  CREATININE 0.85 1.19  CALCIUM 9.0 9.5   Liver Function Tests: Recent Labs  Lab 11/09/22 0743 11/10/22 0432  AST 30 26  ALT 23 22  ALKPHOS 50 61  BILITOT 0.7 0.8  PROT 6.7 7.5  ALBUMIN 3.7 4.5   CBG: Recent Labs  Lab 11/10/22 0924 11/10/22 1122 11/10/22 1318 11/10/22 1641 11/10/22 2041  GLUCAP 167* 165* 165* 217* 200*    Discharge time spent: greater than 30 minutes.  Signed: Enedina Finner, MD [facility] 11/11/2022

## 2022-11-11 NOTE — Evaluation (Signed)
Occupational Therapy Evaluation Patient Details Name: Nathan Dean MRN: [id number] DOB: [birth year] Today's Date: 11/11/2022   History of Present Illness Pt is a 67 year old male admitted with with L2 compression fracture. PMH significant for morbid obesity, peripheral neuropathy, type 2 diabetes, history of hydrocephalus status post shunting, history of renal cell cancer status posttreatment, hypertension, OSA, depression, chronic pain presenting with fall, L2 vertebral fracture, hypoxia.Of note, rapid response called 6/26 due to mental status/lethargic the setting of low blood pressure.   Clinical Impression   Chart reviewed, pt greeted in bed, alert and oriented x4, agreeable to OT evaluation. PTA pt reports generally MOD I with ADL, wife assists with IADLs. Pt amb with RW or rollator. Pt presents with deficits in strength, endurance, activity tolerance, balance all affecting safe and optimal ADL completion. Lumbar corset donned throughout mobility. Education and problem solving re: AE use for safe ADL completion. Pt is left in bedside chair, all needs met. OT will continue to follow acutely.      Recommendations for follow up therapy are one component of a multi-disciplinary discharge planning process, led by the attending physician.  Recommendations may be updated based on patient status, additional functional criteria and insurance authorization.   Assistance Recommended at Discharge Intermittent Supervision/Assistance  Patient can return home with the following A lot of help with walking and/or transfers;A lot of help with bathing/dressing/bathroom    Functional Status Assessment  Patient has had a recent decline in their functional status and demonstrates the ability to make significant improvements in function in a reasonable and predictable amount of time.  Equipment Recommendations  Wheelchair (measurements OT) , reacher, sock aid, long handled shoe horn   Recommendations for Other  Services       Precautions / Restrictions Precautions Precautions: Fall;Back Required Braces or Orthoses: Spinal Brace Spinal Brace: Lumbar corset;Applied in sitting position Restrictions Weight Bearing Restrictions: No      Mobility Bed Mobility Overal bed mobility: Needs Assistance Bed Mobility: Rolling, Sidelying to Sit, Sit to Sidelying Rolling: Supervision Sidelying to sit: Supervision     Sit to sidelying: Supervision General bed mobility comments: frequent vcs for technique    Transfers Overall transfer level: Needs assistance Equipment used: Rolling walker (2 wheels) Transfers: Sit to/from Stand Sit to Stand: From elevated surface, Min guard, Min assist (with bari RW)                  Balance Overall balance assessment: Needs assistance Sitting-balance support: Bilateral upper extremity supported Sitting balance-Leahy Scale: Fair     Standing balance support: During functional activity, Bilateral upper extremity supported Standing balance-Leahy Scale: Fair                             ADL either performed or assessed with clinical judgement   ADL Overall ADL's : Needs assistance/impaired Eating/Feeding: Set up;Sitting   Grooming: Sitting;Set up Grooming Details (indicate cue type and reason): anticipated             Lower Body Dressing: Maximal assistance Lower Body Dressing Details (indicate cue type and reason): educated re: use of AE for safe ADL completion Toilet Transfer: Min guard;Rolling walker (2 wheels) Toilet Transfer Details (indicate cue type and reason): simulated to bedside chair, intermittent vcs for technique Toileting- Clothing Manipulation and Hygiene: Minimal assistance;Moderate assistance Toileting - Clothing Manipulation Details (indicate cue type and reason): anticipate     Functional mobility during ADLs: Min guard;Rolling walker (  2 wheels) (approx 5' in room with bari RW)       Vision Patient Visual  Report: No change from baseline       Perception     Praxis      Pertinent Vitals/Pain Pain Assessment Pain Assessment: 0-10 Pain Score: 6  Pain Location: L hip, upper back Pain Descriptors / Indicators: Aching, Discomfort Pain Intervention(s): Monitored during session, Limited activity within patient's tolerance, Premedicated before session, Repositioned     Hand Dominance Right   Extremity/Trunk Assessment Upper Extremity Assessment Upper Extremity Assessment: Generalized weakness   Lower Extremity Assessment Lower Extremity Assessment: Generalized weakness   Cervical / Trunk Assessment Cervical / Trunk Assessment: Normal   Communication Communication Communication: No difficulties   Cognition Arousal/Alertness: Awake/alert Behavior During Therapy: WFL for tasks assessed/performed Overall Cognitive Status: No family/caregiver present to determine baseline cognitive functioning Area of Impairment: Problem solving (mildly slow processing)                             Problem Solving: Slow processing       General Comments  vss throughout    Exercises     Shoulder Instructions      Home Living Family/patient expects to be discharged to:: Private residence Living Arrangements: Spouse/significant other Available Help at Discharge: Available 24 hours/day;Family Type of Home: House Home Access: Stairs to enter Secretary/administrator of Steps: 3 Entrance Stairs-Rails: Right;Left;Can reach both Home Layout: One level     Bathroom Shower/Tub: Chief Strategy Officer: Handicapped height Bathroom Accessibility: Yes   Home Equipment: Rollator (4 wheels);Cane - quad;Toilet riser;Other (comment);Rolling Walker (2 wheels);Grab bars - tub/shower;Grab bars - toilet;Shower seat;BSC/3in1 (temporary bed rails, is renting a lift chair)          Prior Functioning/Environment Prior Level of Function : Independent/Modified Independent;History of  Falls (last six months)             Mobility Comments: amb with RW household distances, rollator community distances (will lift rollator into the car) ADLs Comments: Generally MOD I with ADL, assist for IADLs from wife        OT Problem List: Decreased strength;Decreased activity tolerance;Decreased knowledge of use of DME or AE;Impaired balance (sitting and/or standing)      OT Treatment/Interventions:      OT Goals(Current goals can be found in the care plan section) Acute Rehab OT Goals Patient Stated Goal: return home OT Goal Formulation: With patient Time For Goal Achievement: 11/25/22 Potential to Achieve Goals: Good ADL Goals Pt Will Perform Grooming: with modified independence;sitting;standing Pt Will Perform Lower Body Dressing: with modified independence;with adaptive equipment;sit to/from stand Pt Will Transfer to Toilet: with modified independence;ambulating Pt Will Perform Toileting - Clothing Manipulation and hygiene: with modified independence;sit to/from stand  OT Frequency:      Co-evaluation              AM-PAC OT "6 Clicks" Daily Activity     Outcome Measure Help from another person eating meals?: None Help from another person taking care of personal grooming?: None Help from another person toileting, which includes using toliet, bedpan, or urinal?: A Lot Help from another person bathing (including washing, rinsing, drying)?: A Lot Help from another person to put on and taking off regular upper body clothing?: None Help from another person to put on and taking off regular lower body clothing?: A Lot 6 Click Score: 18   End of Session  Equipment Utilized During Treatment: Gait belt;Other (comment) (bari RW, lumbar corset) Nurse Communication: Mobility status  Activity Tolerance: Patient tolerated treatment well;Patient limited by pain Patient left: in chair;with call bell/phone within reach;with chair alarm set  OT Visit Diagnosis: Unsteadiness on  feet (R26.81);Muscle weakness (generalized) (M62.81);Other abnormalities of gait and mobility (R26.89)                Time: 1478-2956 OT Time Calculation (min): 28 min Charges:  OT General Charges $OT Visit: 1 Visit OT Evaluation $OT Eval Moderate Complexity: 1 Mod Oleta Mouse, OTD OTR/L  11/11/22, 10:28 AM

## 2022-11-11 NOTE — Plan of Care (Signed)
  Problem: Education: Goal: Knowledge of General Education information will improve Description: Including pain rating scale, medication(s)/side effects and non-pharmacologic comfort measures Outcome: Progressing   Problem: Health Behavior/Discharge Planning: Goal: Ability to manage health-related needs will improve Outcome: Progressing   Problem: Clinical Measurements: Goal: Diagnostic test results will improve Outcome: Progressing Goal: Cardiovascular complication will be avoided Outcome: Progressing   Problem: Nutrition: Goal: Adequate nutrition will be maintained Outcome: Progressing   Problem: Coping: Goal: Level of anxiety will decrease Outcome: Progressing   Problem: Elimination: Goal: Will not experience complications related to bowel motility Outcome: Progressing Goal: Will not experience complications related to urinary retention Outcome: Progressing   Problem: Pain Managment: Goal: General experience of comfort will improve Outcome: Progressing

## 2022-11-12 NOTE — TOC Transition Note (Signed)
Transition of Care [facility]) - CM/SW Discharge Note   Patient Details  Name: Nathan Dean MRN: [id number] Date of Birth: 1955-08-22  Transition of Care [facility]) CM/SW Contact:  Nathan Cleaver, LCSW Phone Number: 11/12/2022, 2:13 PM   Clinical Narrative:    CSW was informed that patient needed home health setup even though he discharged on 11/11/2022.  CSW spoke to patient's wife Nathan Dean, she stated he has had home health before and would like [facility] if possible.  CSW contacted [place] at [facility] and she said yes they can accept patient.  CSW updated patient's wife, she was appreciative of the update.  Patient will be going home with home health PT and OT through [facility].  TOC signing off please reconsult with any other TOC needs, home health agency has been notified of planned discharge.    Final next level of care: Home w Home Health Services Barriers to Discharge: Barriers Resolved   Patient Goals and CMS Choice CMS Medicare.gov Compare Post Acute Care list provided to:: Patient Represenative (must comment) Choice offered to / list presented to : Spouse  Discharge Placement                         Discharge Plan and Services Additional resources added to the After Visit Summary for                            [facility] Arranged: PT, OT HH Agency: [facility] Date [facility] Agency Contacted: 11/12/22 Time HH Agency Contacted: 1123 Representative spoke with at [facility] Agency: [place]  Social Determinants of Health (SDOH) Interventions SDOH Screenings   Food Insecurity: No Food Insecurity (11/09/2022)  Housing: Low Risk  (11/09/2022)  Transportation Needs: No Transportation Needs (11/09/2022)  Utilities: Not At Risk (11/09/2022)  Depression (PHQ2-9): High Risk (09/28/2022)  Tobacco Use: Medium Risk (11/09/2022)     Readmission Risk Interventions     No data to display

## 2022-11-17 ENCOUNTER — Ambulatory Visit: Payer: Medicare HMO | Admitting: Student in an Organized Health Care Education/Training Program

## 2022-11-25 NOTE — Progress Notes (Deleted)
BH MD/PA/NP OP Progress Note  11/25/2022 9:41 AM Nathan Dean  MRN:  [id number]  Chief Complaint: No chief complaint on file.  HPI: *** According to the chart review, the following events have occurred since the last visit: The patient was admitted due to  L2 vertebral fracture s/p mechanical fall at home with h/o neuropathy and foot drop   Visit Diagnosis: No diagnosis found.  Past Psychiatric History: Please see initial evaluation for full details. I have reviewed the history. No updates at this time.     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    Diabetes (HCC)    GERD (gastroesophageal reflux disease)    Hyperlipidemia    Hypertension    Obesity    Sleep apnea     Past Surgical History:  Procedure Laterality Date   BACK SURGERY     in the 80's -herniated disc   BACK SURGERY     L4 bone spur 1990   CARPAL TUNNEL RELEASE Right 12/28/2016   Procedure: RIGHT ULNAR AND MEDIAN NEUROPLASTY AT WRIST;  Surgeon: Mack Hook, MD;  Location: [facility];  Service: Orthopedics;  Laterality: Right;   CARPAL TUNNEL RELEASE Left    25 years ago   CATARACT EXTRACTION W/ INTRAOCULAR LENS IMPLANT Bilateral    REPLACEMENT TOTAL KNEE BILATERAL  2014   left 2012 rt 2014   SHOULDER OPEN ROTATOR CUFF REPAIR Right 08/07/2019   Procedure: ROTATOR CUFF REPAIR SHOULDER OPEN;  Surgeon: Juanell Fairly, MD;  Location: [facility];  Service: Orthopedics;  Laterality: Right;   SKIN GRAFT Left 06/19/2022   left side of neck for skin cancer on top of head   TONSILLECTOMY      Family Psychiatric History: Please see initial evaluation for full details. I have reviewed the history. No updates at this time.    Family History:  Family History  Problem Relation Age of Onset   Ovarian cancer Mother    Kidney failure Father    Cancer Brother     Social History:  Social History    Socioeconomic History   Marital status: Married    Spouse name: Not on file   Number of children: 0   Years of education: Not on file   Highest education level: Associate degree: academic program  Occupational History   Occupation: Currently Working   Tobacco Use   Smoking status: Former    Current packs/day: 0.00    Average packs/day: 2.0 packs/day for 20.0 years (40.0 ttl pk-yrs)    Types: Cigarettes    Start date: 06/28/1976    Quit date: 06/28/1996    Years since quitting: 26.4   Smokeless tobacco: Never  Vaping Use   Vaping status: Never Used  Substance and Sexual Activity   Alcohol use: Yes    Comment: occasionally   Drug use: No   Sexual activity: Not on file    Comment: not asked if sexually active  Other Topics Concern   Not on file  Social History Narrative   Right handed    Lives at home spouse    Caffeine 5 -6 cups     Social Determinants of Health   Financial Resource Strain: Low Risk  (05/27/2022)   Received from [facility]   Overall Financial Resource Strain (CARDIA)    Difficulty of Paying Living Expenses: Not very hard  Food Insecurity: No Food Insecurity (11/09/2022)  Hunger Vital Sign    Worried About Running Out of Food in the Last Year: Never true    Ran Out of Food in the Last Year: Never true  Transportation Needs: No Transportation Needs (11/09/2022)   PRAPARE - Administrator, Civil Service (Medical): No    Lack of Transportation (Non-Medical): No  Physical Activity: Inactive (02/26/2020)   Received from [facility]   Exercise Vital Sign    Days of Exercise per Week: 0 days    Minutes of Exercise per Session: 0 min  Stress: No Stress Concern Present (02/26/2020)   Received from [facility] - Occupational Stress Questionnaire    Feeling of Stress : Not at all  Social Connections: Unknown (09/25/2021)   Received from [facility]    Social Network    Social Network: Not on file    Allergies:  Allergies  Allergen Reactions   Penicillins Anaphylaxis, Rash and Other (See Comments)   Succinylcholine Chloride Anaphylaxis and Rash   Semaglutide Diarrhea   Keflex [Cephalexin] Rash   Sulfa Antibiotics Rash and Other (See Comments)    Metabolic Disorder Labs: Lab Results  Component Value Date   HGBA1C 7.5 (H) 05/23/2022   MPG 169 05/23/2022   MPG 148.46 08/07/2019   No results found for: "PROLACTIN" Lab Results  Component Value Date   CHOL 199 11/04/2016   TRIG 405 (H) 11/04/2016   HDL 48 11/04/2016   CHOLHDL 4.1 11/04/2016   LDLCALC Comment 11/04/2016   Lab Results  Component Value Date   TSH 1.062 08/16/2019   TSH 1.050 12/23/2014    Therapeutic Level Labs: No results found for: "LITHIUM" No results found for: "VALPROATE" No results found for: "CBMZ"  Current Medications: Current Outpatient Medications  Medication Sig Dispense Refill   albuterol (VENTOLIN HFA) 108 (90 Base) MCG/ACT inhaler Inhale 2 puffs into the lungs every 4 (four) hours as needed.     aspirin EC 81 MG tablet Take 81 mg by mouth daily.     buPROPion (WELLBUTRIN XL) 300 MG 24 hr tablet Take 450 mg by mouth daily.     carvedilol (COREG) 12.5 MG tablet Take 1 tablet (12.5 mg total) by mouth 2 (two) times daily with a meal.     cholecalciferol (VITAMIN D3) 25 MCG (1000 UNIT) tablet Take 2,000 Units by mouth daily.     cyanocobalamin (VITAMIN B12) 1000 MCG tablet Take 5,000 mcg by mouth daily.     donepezil (ARICEPT) 10 MG tablet Take 10 mg by mouth at bedtime.     DULoxetine (CYMBALTA) 60 MG capsule Take 1 capsule (60 mg total) by mouth daily. 15 capsule 0   gabapentin (NEURONTIN) 300 MG capsule Take 3 capsules (900 mg total) by mouth 3 (three) times daily.     HYDROcodone-acetaminophen (NORCO/VICODIN) 5-325 MG tablet Take 1 tablet by mouth every 6 (six) hours as needed for moderate pain. 20 tablet 0   insulin glargine (LANTUS)  100 UNIT/ML injection Inject 30 Units into the skin daily.     insulin lispro (HUMALOG) 100 UNIT/ML injection Inject 18 Units into the skin in the morning and at bedtime. 18 in am. 8 in pm     ketoconazole (NIZORAL) 2 % cream Apply 1 Application topically 2 (two) times daily.     loratadine (CLARITIN) 10 MG tablet Take 10 mg by mouth daily as needed.     losartan (COZAAR) 100 MG tablet Take  1 tablet (100 mg total) by mouth daily. 30 tablet 0   magnesium oxide (MAG-OX) 400 (240 Mg) MG tablet Take 500 mg by mouth daily.     metFORMIN (GLUCOPHAGE) 500 MG tablet Take 1,000 mg by mouth 2 (two) times daily with a meal.     methocarbamol (ROBAXIN) 750 MG tablet Take 750 mg by mouth 4 (four) times daily.     mometasone (ELOCON) 0.1 % cream Apply 1 Application topically daily.     omeprazole (PRILOSEC) 20 MG capsule Take 20 mg by mouth daily.      ondansetron (ZOFRAN-ODT) 4 MG disintegrating tablet Take 8 mg by mouth every 8 (eight) hours as needed for nausea.     rosuvastatin (CRESTOR) 20 MG tablet Take 20 mg by mouth daily.     senna-docusate (SENOKOT-S) 8.6-50 MG tablet Take 2 tablets by mouth daily.     tirzepatide [facility]) 2.5 MG/0.5ML Pen Inject 2.5 mg into the skin once a week.     traZODone (DESYREL) 50 MG tablet Take 1 tablet by mouth at bedtime.     No current facility-administered medications for this visit.     Musculoskeletal: Strength & Muscle Tone: within normal limits Gait & Station: normal Patient leans: N/A  Psychiatric Specialty Exam: Review of Systems  There were no vitals taken for this visit.There is no height or weight on file to calculate BMI.  General Appearance: {Appearance:22683}  Eye Contact:  {BHH EYE CONTACT:22684}  Speech:  Clear and Coherent  Volume:  Normal  Mood:  {BHH MOOD:22306}  Affect:  {Affect (PAA):22687}  Thought Process:  Coherent  Orientation:  Full (Time, Place, and Person)  Thought Content: Logical   Suicidal Thoughts:  {ST/HT (PAA):22692}   Homicidal Thoughts:  {ST/HT (PAA):22692}  Memory:  Immediate;   Good  Judgement:  {Judgement (PAA):22694}  Insight:  {Insight (PAA):22695}  Psychomotor Activity:  Normal  Concentration:  Concentration: Good and Attention Span: Good  Recall:  Good  Fund of Knowledge: Good  Language: Good  Akathisia:  No  Handed:  Right  AIMS (if indicated): not done  Assets:  Communication Skills Desire for Improvement  ADL's:  Intact  Cognition: WNL  Sleep:  {BHH GOOD/FAIR/POOR:22877}   Screenings: GAD-7    Flowsheet Row Office Visit from 09/28/2022 in [facility]  Total GAD-7 Score 12      PHQ2-9    Flowsheet Row Office Visit from 09/28/2022 in [id number] Procedure visit from 05/12/2022 in [facility] Procedure visit from 09/16/2021 in [facility] Visit from 05/26/2018 in Primary Care at [facility] Visit from 04/18/2018 in Primary Care at [facility]  PHQ-2 Total Score 5 0 0 0 0  PHQ-9 Total Score 21 -- -- -- --      Flowsheet Row ED to Hosp-Admission (Discharged) from 11/09/2022 in [facility] (1A) ED from 10/18/2022 in [facility] Department at [facility] ED to Hosp-Admission (Discharged) from 05/23/2022 in [facility] 1C MEDICAL TELEMETRY  C-SSRS RISK CATEGORY No Risk No Risk No Risk        Assessment and Plan:  Nathan Dean is a 67 y.o. year old male with a history of depression, memory concern with hydrocephalus status post VP shunt placement + history of repeated head trauma in setting of falls, diabetic peripheral neuropathy, hypertension, OSA on CPAP,  who is referred for  depression.    1. Current moderate episode of major depressive disorder without prior episode (HCC) Acute stressors include:  Other  stressors include: unsteady gait secondary to hydrocephalus s/p shunt placement, diabetic neuropathy, losses of his close parents, brother several years ago, unemployment    History:  no prior history of depression prior to hydrocephalus  He reports depressive symptoms in the context of stressors as above, although there has been slight improvement since bupropion was up titrated by his PCP. While his current mood symptoms be largely secondary to demoralization. However, given he reports benefit from higher dose of bupropion, will try uptitration of duloxetine to optimize treatment for depression and diabetic neuropathy. Discussed potential risk of hypertension, headache.  Noted that although he has hypertensive on today's evaluation, his home BP has been within good control according to the caregiver.  Will continue to assess this.  Will continue bupropion as adjunctive treatment for depression.  He will greatly benefit from CBT/supportive therapy; will make referral.    # cognitive impairment - on donepezil, prescribed by neurology - hydrocephalus status post VP shunt placement + history of repeated head trauma in setting of fall It is multifactorial, and he does have mood symptoms as above.  Will continue to assess.    Plan Increase duloxetine 90 mg daily  Continue bupropion 450 mg daily  Next appointment: 7/23 at 10:30 for 30 mins, IP Referral to therapy- [facility] - on monjaro - on trazodone, gabapentin 900 mg tid   The patient demonstrates the following risk factors for suicide: Chronic risk factors for suicide include: psychiatric disorder of depression and chronic pain. Acute risk factors for suicide include: unemployment and loss (financial, interpersonal, professional). Protective factors for this patient include: positive social support, coping skills, and hope for the future. Considering these factors, the overall suicide risk at this point appears to be low. Patient is appropriate for  outpatient follow up.   Collaboration of Care: Collaboration of Care: {BH OP Collaboration of Care:21014065}  Patient/Guardian was advised Release of Information must be obtained prior to any record release in order to collaborate their care with an outside provider. Patient/Guardian was advised if they have not already done so to contact the registration department to sign all necessary forms in order for [name] to release information regarding their care.   Consent: Patient/Guardian gives verbal consent for treatment and assignment of benefits for services provided during this visit. Patient/Guardian expressed understanding and agreed to proceed.    Neysa Hotter, MD 11/25/2022, 9:41 AM

## 2022-11-30 ENCOUNTER — Ambulatory Visit: Payer: Medicare HMO | Admitting: Neurosurgery

## 2022-12-03 ENCOUNTER — Ambulatory Visit (INDEPENDENT_AMBULATORY_CARE_PROVIDER_SITE_OTHER): Payer: Medicare HMO | Admitting: Clinical

## 2022-12-03 DIAGNOSIS — F331 Major depressive disorder, recurrent, moderate: Secondary | ICD-10-CM | POA: Diagnosis not present

## 2022-12-03 NOTE — Progress Notes (Signed)
                Karen Sharpe, LCSW 

## 2022-12-03 NOTE — Progress Notes (Signed)
[facility] Counselor Initial Adult Exam  Name: Nathan Dean Date: 12/03/2022 MRN: [id number] DOB: September 13, 1955 PCP: Lorrine Kin, MD  Time spent: 10:34am - 11:23am   Guardian/Payee:  NA    Paperwork requested:  NA  Reason for Visit /Presenting Problem: Patient stated, "about 3 weeks ago I broke my back and I'm kind of incapacitated". Patient stated, "I have an issue of living back in the 70's" and reported most of his family passed away in the 103's. Patient reported when he is around his wife's family he stated, "I clam up" and reported this is causing an issue in his relationship with his wife. Patient stated, "Its like I'm in my own little world in 1970's".   Mental Status Exam: Appearance:   Neat and Well Groomed     Behavior:  Appropriate  Motor:  Normal  Speech/Language:   Clear and Coherent  Affect:  Appropriate  Mood:  normal  Thought process:  normal  Thought content:    WNL  Sensory/Perceptual disturbances:    WNL  Orientation:  oriented to person, place, and situation  Attention:  Good  Concentration:  Good  Memory:  Patient's wife reported patient experiences executive function impairment and short term memory loss  Fund of knowledge:   Good  Insight:    Fair  Judgment:   Good  Impulse Control:  Good   Reported Symptoms:  Patient stated, "I clam up", isolates himself when he is around his wife's family, feeling "down" in regards to mood most days, decreased energy, fatigue, lack of motivation, loss of interest, "very little of any concentration". Patient's wife reported patient experiences executive functioning impairment, short term memory loss, sleeps 12-14 hours per day. Wife reported symptoms have occurred "for a long time". Wife reported symptoms "have progressively gotten worse".    Risk Assessment: Danger to Self:  No Patient denied current and past suicidal ideation and symptoms of psychosis Self-injurious Behavior: No Danger to  Others: No Patient denied current and past homicidal ideation Duty to Warn:no Physical Aggression / Violence:No  Access to Firearms a concern: No  Gang Involvement:No  Patient / guardian was educated about steps to take if suicide or homicide risk level increases between visits: yes While future psychiatric events cannot be accurately predicted, the patient does not currently require acute inpatient psychiatric care and does not currently meet [facility] involuntary commitment criteria.  Substance Abuse History: Current substance abuse: Yes   Patient reported current alcohol use of 1-2 drinks "on occasion" with last use 1-2 months ago. Patient reported a history of daily tobacco use in the 1970's and early 1980's, but reported no use since then. Patient reported a history of marijuana use occasionally with last use in 1980.   Past Psychiatric History:   Previous psychological history is significant for marital issues Outpatient Providers: history of individual therapy in 1990's History of Psych Hospitalization: No  Psychological Testing:  cognitive assessment through a provider in [place] (patient unable to provide name of provider)    Abuse History:  Victim of: No.,  none    Report needed: No. Victim of Neglect:No. 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  Family History:  Family History  Problem Relation Age of Onset   Ovarian cancer Mother    Kidney failure Father  Cancer Brother     Living situation: the patient lives with their spouse  Sexual Orientation: Straight  Relationship Status: married  Name of spouse / other: Britta Mccreedy If a parent, number of children / ages: 2 stepdaughters ages 48, 35  Support Systems: spouse  Surveyor, quantity Stress:  No   Income/Employment/Disability: retired for 3  years  Financial planner: No   Educational History: Education:  associate's degree in Designer, television/film set: [employer]  Any cultural differences that may affect / interfere with treatment:  not applicable   Recreation/Hobbies: Patient stated, "none"  Stressors: Health problems    Strengths: Supportive Relationships  Barriers:  Patient reported he recently broke his back and his mobility is currently limited   Legal History: Pending legal issue / charges: The patient has no significant history of legal issues. 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    Diabetes (HCC)    GERD (gastroesophageal reflux disease)    Hyperlipidemia    Hypertension    Obesity    Sleep apnea     Past Surgical History:  Procedure Laterality Date   BACK SURGERY     in the 80's -herniated disc   BACK SURGERY     L4 bone spur 1990   CARPAL TUNNEL RELEASE Right 12/28/2016   Procedure: RIGHT ULNAR AND MEDIAN NEUROPLASTY AT WRIST;  Surgeon: Mack Hook, MD;  Location: [facility];  Service: Orthopedics;  Laterality: Right;   CARPAL TUNNEL RELEASE Left    25 years ago   CATARACT EXTRACTION W/ INTRAOCULAR LENS IMPLANT Bilateral    REPLACEMENT TOTAL KNEE BILATERAL  2014   left 2012 rt 2014   SHOULDER OPEN ROTATOR CUFF REPAIR Right 08/07/2019   Procedure: ROTATOR CUFF REPAIR SHOULDER OPEN;  Surgeon: Juanell Fairly, MD;  Location: [facility];  Service: Orthopedics;  Laterality: Right;   SKIN GRAFT Left 06/19/2022   left side of neck for skin cancer on top of head   TONSILLECTOMY    12/03/22 Patient reported a history of surgery to treat hydrocephalus 3 years ago  Medications: Current Outpatient Medications  Medication Sig Dispense Refill   albuterol (VENTOLIN HFA) 108 (90 Base) MCG/ACT inhaler Inhale 2  puffs into the lungs every 4 (four) hours as needed.     aspirin EC 81 MG tablet Take 81 mg by mouth daily.     buPROPion (WELLBUTRIN XL) 300 MG 24 hr tablet Take 450 mg by mouth daily.     carvedilol (COREG) 12.5 MG tablet Take 1 tablet (12.5 mg total) by mouth 2 (two) times daily with a meal.     cholecalciferol (VITAMIN D3) 25 MCG (1000 UNIT) tablet Take 2,000 Units by mouth daily.     cyanocobalamin (VITAMIN B12) 1000 MCG tablet Take 5,000 mcg by mouth daily.     donepezil (ARICEPT) 10 MG tablet Take 10 mg by mouth at bedtime.     DULoxetine (CYMBALTA) 60 MG capsule Take 1 capsule (60 mg total) by mouth daily. 15 capsule 0   gabapentin (NEURONTIN) 300 MG capsule Take 3 capsules (900 mg total) by mouth 3 (three) times daily.     HYDROcodone-acetaminophen (NORCO/VICODIN) 5-325 MG tablet Take 1 tablet by mouth every 6 (six) hours as needed for moderate pain. 20 tablet 0   insulin glargine (LANTUS) 100 UNIT/ML injection Inject 30 Units into the skin daily.  insulin lispro (HUMALOG) 100 UNIT/ML injection Inject 18 Units into the skin in the morning and at bedtime. 18 in am. 8 in pm     ketoconazole (NIZORAL) 2 % cream Apply 1 Application topically 2 (two) times daily.     loratadine (CLARITIN) 10 MG tablet Take 10 mg by mouth daily as needed.     losartan (COZAAR) 100 MG tablet Take 1 tablet (100 mg total) by mouth daily. 30 tablet 0   magnesium oxide (MAG-OX) 400 (240 Mg) MG tablet Take 500 mg by mouth daily.     metFORMIN (GLUCOPHAGE) 500 MG tablet Take 1,000 mg by mouth 2 (two) times daily with a meal.     methocarbamol (ROBAXIN) 750 MG tablet Take 750 mg by mouth 4 (four) times daily.     mometasone (ELOCON) 0.1 % cream Apply 1 Application topically daily.     omeprazole (PRILOSEC) 20 MG capsule Take 20 mg by mouth daily.      ondansetron (ZOFRAN-ODT) 4 MG disintegrating tablet Take 8 mg by mouth every 8 (eight) hours as needed for nausea.     rosuvastatin (CRESTOR) 20 MG tablet Take 20 mg  by mouth daily.     senna-docusate (SENOKOT-S) 8.6-50 MG tablet Take 2 tablets by mouth daily.     tirzepatide [facility]) 2.5 MG/0.5ML Pen Inject 2.5 mg into the skin once a week.     traZODone (DESYREL) 50 MG tablet Take 1 tablet by mouth at bedtime.     No current facility-administered medications for this visit.    Allergies  Allergen Reactions   Penicillins Anaphylaxis, Rash and Other (See Comments)   Succinylcholine Chloride Anaphylaxis and Rash   Semaglutide Diarrhea   Keflex [Cephalexin] Rash   Sulfa Antibiotics Rash and Other (See Comments)    Diagnoses:  Major depressive disorder, recurrent episode, moderate (HCC)  Plan of Care: Patient is a 67 year old male who presented for an initial assessment. Clinician conducted initial assessment via caregility video from clinician's home office. Patient provided verbal consent to proceed with telehealth session and is aware of limitations of telephone or video visits. Patient participated in session from patient's home. Patient asked his wife to participate in a portion of today's assessment and provided verbal consent for wife to be present during today's assessment. Patient stated, "about 3 weeks ago I broke my back and I'm kind of incapacitated" when clinician inquired about the reason for today's visit. The following symptoms were reported: patient isolates himself when he is around his wife's family, feeling "down" in regards to mood most days, decreased energy, fatigue, lack of motivation, loss of interest, decreased concentration, executive functioning impairment, short term memory loss, and sleeps 12-14 hours per day. Patient's wife reported patient's symptoms have occurred "for a long time" and stated, "have progressively gotten worse".  Patient denied current and past suicidal ideation, homicidal ideation, and symptoms of psychosis. Patient reported current alcohol use of 1-2 drinks "on occasion" with last use 1-2 months ago. Patient  reported a history of participation in individual therapy. Patient reported no history of psychiatric hospitalizations. Patient reported his wife is a current support. It is recommended patient be referred to a psychiatrist for a medication management consult and recommended patient participate in individual therapy biweekly. Clinician will review recommendations and treatment plan with patient during follow up appointment. Treatment plan will be developed during follow up appointment.   Collaboration of Care: Other Patient declined to complete consents at this time.   Patient/Guardian was advised  Release of Information must be obtained prior to any record release in order to collaborate their care with an outside provider. Patient/Guardian was advised if they have not already done so to contact [facility] to sign all necessary forms in order for [name] to release information regarding their care.   Doree Barthel, LCSW

## 2022-12-07 ENCOUNTER — Ambulatory Visit: Payer: Medicare HMO | Admitting: Psychiatry

## 2022-12-27 ENCOUNTER — Ambulatory Visit (INDEPENDENT_AMBULATORY_CARE_PROVIDER_SITE_OTHER): Payer: Medicare HMO | Admitting: Clinical

## 2022-12-27 DIAGNOSIS — F331 Major depressive disorder, recurrent, moderate: Secondary | ICD-10-CM | POA: Diagnosis not present

## 2022-12-27 NOTE — Progress Notes (Signed)
[facility] Counselor/Therapist Progress Note  Patient ID: Nathan Dean, MRN: [id number]    Date: 12/27/22  Time Spent: 8:33  am - 9:13 am : 40 Minutes  Treatment Type: Individual Therapy.  Reported Symptoms: Patient reported decreased appetite  Mental Status Exam: Appearance:  Neat and Well Groomed     Behavior: Appropriate  Motor: Normal  Speech/Language:  Clear and Coherent  Affect: Appropriate  Mood: normal  Thought process: normal  Thought content:   WNL  Sensory/Perceptual disturbances:   WNL  Orientation: oriented to person, place, and situation  Attention: Good  Concentration: Good  Memory: Patient's wife previously reported patient experiences executive function impairment and short term memory loss   Fund of knowledge:  Good  Insight:   Fair  Judgment:  Good  Impulse Control: Good   Risk Assessment: Danger to Self:  No Patient denied current suicidal ideation  Self-injurious Behavior: No Danger to Others: No Patient denied current homicidal ideation Duty to Warn:no Physical Aggression / Violence:No  Access to Firearms a concern: No  Gang Involvement:No   Subjective:  Patient reported he had an operation called hypoplasty on his back on Friday. Patient reported he is experiencing pain as a result of the surgery. Patient reported a decrease in appetite daily. Patient reported no questions related to the diagnosis. Patient's wife reported patient had an appointment with psychiatrist, Dr. Neysa Hotter, and has a follow up appointment. Patient reported he recently fell in the bathroom and had to call EMS to assist.  Patient stated, "that's fine" in response to participation in therapy. Patient stated, "I'd like to say to get back to normal but I don't remember what normal is". Patient reported difficulty expressing his thoughts and feelings. Patient stated, "ok" in response to patient's current mood.    Interventions: Motivational Interviewing.  Clinician conducted session via caregility video from clinician's home office. Patient provided verbal consent to proceed with telehealth session and is aware of limitations of telephone or video visits. Patient participated in session from patient's home. Patient provided verbal consent for patient's wife to be present during a portion of today's session and patient's wife participated in a portion of today's session.  Clinician reviewed diagnosis and treatment recommendations. Provided psycho education related to diagnosis and treatment. Clinician utilized motivational interviewing to explore potential goals for therapy.   Diagnosis:  Major depressive disorder, recurrent episode, moderate (HCC)   Plan: Goals to be determined during follow up appointment on 01/07/23.                   Doree Barthel, LCSW

## 2023-01-07 ENCOUNTER — Ambulatory Visit (INDEPENDENT_AMBULATORY_CARE_PROVIDER_SITE_OTHER): Payer: Medicare HMO | Admitting: Clinical

## 2023-01-07 DIAGNOSIS — F331 Major depressive disorder, recurrent, moderate: Secondary | ICD-10-CM

## 2023-01-07 NOTE — Progress Notes (Signed)
[facility] Counselor/Therapist Progress Note  Patient ID: Nathan Dean, MRN: [id number]    Date: 01/07/23  Time Spent: 9:32  am - 10:15 am : 43 Minutes  Treatment Type: Individual Therapy.  Reported Symptoms: Patient reported crying at times in response to back pain  Mental Status Exam: Appearance:  Neat and Well Groomed     Behavior: Appropriate  Motor: Normal  Speech/Language:  Clear and Coherent  Affect: Appropriate  Mood: normal  Thought process: normal  Thought content:   WNL  Sensory/Perceptual disturbances:   WNL  Orientation: oriented to person, place, and situation  Attention: Good  Concentration: Good  Memory: WNL  Fund of knowledge:  Good  Insight:   Fair  Judgment:  Good  Impulse Control: Good   Risk Assessment: Danger to Self:  No Patient denied current suicidal ideation  Self-injurious Behavior: No Danger to Others: No Patient denied current homicidal ideation Duty to Warn:no Physical Aggression / Violence:No  Access to Firearms a concern: No  Gang Involvement:No   Subjective:  Patient reported he has been crying in response to pain from his recent back surgery.  Patient reported he has been prescribed pain medication but reported he does not want to take the mediation due to the type of medication. Patient stated, "my wife jumps on me" and reported feeling he is being addressed as a child during conversations with his wife. Patient stated, "I'm in a glass bowl and I don't know how to get out of it". Patient reported feeling he is always wrong in conversations with his wife. Patient stated, "everything I do is wrong". Patient reported he has a follow up appointment with his psychiatrist next week. Patient reported he plans to discuss couples counseling with his wife. Patient reported conflict with his wife in regards to patient's step children is a current stressor. Patient reported "50/50" in response to patient's mood since last session and  reported his mood is dependent upon his interactions with his wife. Patient stated, "fine" in response to patient's current mood. Patient stated, "get my head straightened out" and "living a normal life" in response to goals for therapy.   Interventions: Motivational Interviewing. Clinician conducted session via caregility video from clinician's home office. Patient provided verbal consent to proceed with telehealth session and is aware of limitations of telephone or video visits. Patient participated in session from patient's home. Discussed current stressors and the impact on patient's mood. Discussed the status of patient's follow up appointment with his psychiatrist. Discussed recommendation for couples counseling. Assessed patient's mood since last session and patient's current mood. Clinician utilized motivational interviewing to explore potential goals for therapy. Provided reflective listening. Clinician requested for homework patient document changes he would like to see take place in his life.   Collaboration of Care: Other not required at this time  Diagnosis:  Major depressive disorder, recurrent episode, moderate (HCC)   Plan: Goals to be determined during follow up appointment on 01/21/23.                      Doree Barthel, LCSW

## 2023-01-10 NOTE — Progress Notes (Unsigned)
BH MD/PA/NP OP Progress Note  01/13/2023 5:10 PM Nathan Dean  MRN:  [id number]  Chief Complaint:  Chief Complaint  Patient presents with   Follow-up   HPI:  According to the chart review, the following events have occurred since the last visit: The patient was admitted due to L2 vertebral fracture s/p mechanical fall at home with history of neuropathy   Prior to this visit, he felt down when he tried to come out of the car.  EMS was called to bring him up. Both the patient and his wife declines to be evaluated at urgent care as they do not see any worsening in pain.   He states that it has been rough.  He does not want to do things.  He feels like he is using people. He needs to ask others to do thing.  He experiences pain from 3-7/10.  It has been affecting his mood.  He agrees that he does not go to get ice cream anymore.  He cannot drive, and he does not want to cause trouble to his wife, even when she invited him to do so.  He agrees that he also does not feel like doing things. The patient has mood symptoms as in PHQ-9/GAD-7.   He denies SI. He was able to see his therapist, and feels comfortable to continue therapy.   Britta Mccreedy, his wife presents to the visit.  He has been staying to himself or stay in the bed.  He sleeps up to 16 hours.  The CPAP machine has been broken, and they have been trying to get the other one.  His PCP reduced duloxetine due to concern of vomiting.  He currently takes 30 mg every day.  He does not care about seeing his family anymore.  He does not go out to get ice cream.   Visit Diagnosis:    ICD-10-CM   1. Major depressive disorder, recurrent episode, moderate (HCC)  F33.1       Past Psychiatric History: Please see initial evaluation for full details. I have reviewed the history. No updates at this time.     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     Diabetes (HCC)    GERD (gastroesophageal reflux disease)    Hyperlipidemia    Hypertension    Obesity    Sleep apnea     Past Surgical History:  Procedure Laterality Date   BACK SURGERY     in the 80's -herniated disc   BACK SURGERY     L4 bone spur 1990   CARPAL TUNNEL RELEASE Right 12/28/2016   Procedure: RIGHT ULNAR AND MEDIAN NEUROPLASTY AT WRIST;  Surgeon: Mack Hook, MD;  Location: [facility];  Service: Orthopedics;  Laterality: Right;   CARPAL TUNNEL RELEASE Left    25 years ago   CATARACT EXTRACTION W/ INTRAOCULAR LENS IMPLANT Bilateral    REPLACEMENT TOTAL KNEE BILATERAL  2014   left 2012 rt 2014   SHOULDER OPEN ROTATOR CUFF REPAIR Right 08/07/2019   Procedure: ROTATOR CUFF REPAIR SHOULDER OPEN;  Surgeon: Juanell Fairly, MD;  Location: [facility];  Service: Orthopedics;  Laterality: Right;   SKIN GRAFT Left 06/19/2022   left side of neck for skin cancer on top of head   TONSILLECTOMY      Family Psychiatric History: Please see initial evaluation for full details. I have reviewed the history. No updates at this  time.    Family History:  Family History  Problem Relation Age of Onset   Ovarian cancer Mother    Kidney failure Father    Cancer Brother     Social History:  Social History   Socioeconomic History   Marital status: Married    Spouse name: Not on file   Number of children: 0   Years of education: Not on file   Highest education level: Associate degree: academic program  Occupational History   Occupation: Currently Working   Tobacco Use   Smoking status: Former    Current packs/day: 0.00    Average packs/day: 2.0 packs/day for 20.0 years (40.0 ttl pk-yrs)    Types: Cigarettes    Start date: 06/28/1976    Quit date: 06/28/1996    Years since quitting: 26.5   Smokeless tobacco: Never  Vaping Use   Vaping status: Never Used  Substance and Sexual Activity   Alcohol use: Yes    Comment: occasionally   Drug use: No   Sexual  activity: Not on file    Comment: not asked if sexually active  Other Topics Concern   Not on file  Social History Narrative   Right handed    Lives at home spouse    Caffeine 5 -6 cups     Social Determinants of Health   Financial Resource Strain: Low Risk  (05/27/2022)   Received from [facility], [facility]   Overall Financial Resource Strain (CARDIA)    Difficulty of Paying Living Expenses: Not very hard  Food Insecurity: No Food Insecurity (11/09/2022)   Hunger Vital Sign    Worried About Running Out of Food in the Last Year: Never true    Ran Out of Food in the Last Year: Never true  Transportation Needs: No Transportation Needs (11/09/2022)   PRAPARE - Administrator, Civil Service (Medical): No    Lack of Transportation (Non-Medical): No  Physical Activity: Inactive (02/26/2020)   [facility], [facility]   Exercise Vital Sign    Days of Exercise per Week: 0 days    Minutes of Exercise per Session: 0 min  Stress: No Stress Concern Present (02/26/2020)   [facility], [facility]   [employer] of Occupational Health - Occupational Stress Questionnaire    Feeling of Stress : Not at all  Social Connections: Unknown (09/25/2021)   Received from [facility], [facility]   Social Network    Social Network: Not on file    Allergies:  Allergies  Allergen Reactions   Penicillins Anaphylaxis, Rash and Other (See Comments)   Succinylcholine Chloride Anaphylaxis and Rash   Semaglutide Diarrhea   Keflex [Cephalexin] Rash   Sulfa Antibiotics Rash and Other (See Comments)    Metabolic Disorder Labs: Lab Results  Component Value Date   HGBA1C 7.5 (H) 05/23/2022   MPG 169 05/23/2022   MPG 148.46 08/07/2019   No results found for: "PROLACTIN" Lab Results  Component Value Date   CHOL 199 11/04/2016   TRIG 405 (H)  11/04/2016   HDL 48 11/04/2016   CHOLHDL 4.1 11/04/2016   LDLCALC Comment 11/04/2016   Lab Results  Component Value Date   TSH 1.062 08/16/2019   TSH 1.050 12/23/2014    Therapeutic Level Labs: No results found for: "LITHIUM" No results found for: "VALPROATE" No results found for: "CBMZ"  Current Medications: Current Outpatient Medications  Medication Sig Dispense Refill   albuterol (VENTOLIN HFA) 108 (90 Base) MCG/ACT inhaler Inhale 2 puffs into the lungs every 4 (four) hours as needed.     aspirin EC 81 MG tablet Take 81 mg by mouth daily.     buPROPion (WELLBUTRIN XL) 300 MG 24 hr tablet Take 450 mg by mouth daily.     carvedilol (COREG) 12.5 MG tablet Take 1 tablet (12.5 mg total) by mouth 2 (two) times daily with a meal.     cholecalciferol (VITAMIN D3) 25 MCG (1000 UNIT) tablet Take 2,000 Units by mouth daily.     cyanocobalamin (VITAMIN B12) 1000 MCG tablet Take 5,000 mcg by mouth daily.     DULoxetine (CYMBALTA) 60 MG capsule Take 1 capsule (60 mg total) by mouth daily. (Patient taking differently: Take 30 mg by mouth daily.) 15 capsule 0   gabapentin (NEURONTIN) 300 MG capsule Take 3 capsules (900 mg total) by mouth 3 (three) times daily.     HYDROcodone-acetaminophen (NORCO/VICODIN) 5-325 MG tablet Take 1 tablet by mouth every 6 (six) hours as needed for moderate pain. 20 tablet 0   insulin glargine (LANTUS) 100 UNIT/ML injection Inject 30 Units into the skin daily.     insulin lispro (HUMALOG) 100 UNIT/ML injection Inject 18 Units into the skin in the morning and at bedtime. 18 in am. 8 in pm     ketoconazole (NIZORAL) 2 % cream Apply 1 Application topically 2 (two) times daily.     loratadine (CLARITIN) 10 MG tablet Take 10 mg by mouth daily as needed.     losartan (COZAAR) 100 MG tablet Take 1 tablet (100 mg total) by mouth daily. 30 tablet 0   magnesium oxide (MAG-OX) 400 (240 Mg) MG tablet Take 500 mg by mouth daily.     metFORMIN (GLUCOPHAGE) 500 MG tablet Take 1,000  mg by mouth 2 (two) times daily with a meal.     methocarbamol (ROBAXIN) 750 MG tablet Take 750 mg by mouth 4 (four) times daily.     mometasone (ELOCON) 0.1 % cream Apply 1 Application topically daily.     omeprazole (PRILOSEC) 20 MG capsule Take 40 mg by mouth 2 times daily at 12 noon and 4 pm.     ondansetron (ZOFRAN-ODT) 4 MG disintegrating tablet Take 8 mg by mouth every 8 (eight) hours as needed for nausea.     rosuvastatin (CRESTOR) 20 MG tablet Take 20 mg by mouth daily.     senna-docusate (SENOKOT-S) 8.6-50 MG tablet Take 2 tablets by mouth daily.     tirzepatide [facility]) 2.5 MG/0.5ML Pen Inject 2.5 mg into the skin once a week.     traZODone (DESYREL) 50 MG tablet Take 1 tablet by mouth at bedtime.     donepezil (ARICEPT) 10 MG tablet Take 10 mg by mouth at bedtime.     No current facility-administered medications for this visit.     Musculoskeletal: Strength & Muscle Tone: within normal limits Gait & Station: normal Patient leans: N/A  Psychiatric Specialty Exam: Review of Systems  Psychiatric/Behavioral:  Positive for decreased concentration, dysphoric mood and sleep disturbance. Negative for agitation, behavioral problems, confusion, hallucinations, self-injury and suicidal ideas. The patient is nervous/anxious. The patient is not hyperactive.   All other systems reviewed and are negative.   Blood pressure 132/86, pulse (!) 101, temperature (!) 95 F (35 C), temperature source Skin, height 6\' 9"  (2.057 m), weight (!) 318 lb (144.2 kg).Body mass index is 34.08 kg/m.  General Appearance:  Fairly Groomed  Eye Contact:  Good  Speech:  Clear and Coherent  Volume:  Normal  Mood:  Depressed  Affect:  Appropriate, Congruent, and down  Thought Process:  Coherent  Orientation:  Full (Time, Place, and Person)  Thought Content: Logical   Suicidal Thoughts:  No  Homicidal Thoughts:  No  Memory:  Immediate;   Good  Judgement:  Good  Insight:  Good  Psychomotor Activity:   Normal  Concentration:  Concentration: Good and Attention Span: Good  Recall:  Good  Fund of Knowledge: Good  Language: Good  Akathisia:  No  Handed:  Right  AIMS (if indicated): not done  Assets:  Communication Skills Desire for Improvement  ADL's:  Intact  Cognition: WNL  Sleep:   hypersomnia   Screenings: GAD-7    Flowsheet Row Office Visit from 01/13/2023 in [facility] Office Visit from 09/28/2022 in [facility]  Total GAD-7 Score 11 12      PHQ2-9    Flowsheet Row Office Visit from 01/13/2023 in [facility] Office Visit from 09/28/2022 in [facility] Procedure visit from 05/12/2022 in [facility] Procedure visit from 09/16/2021 in [facility] Visit from 05/26/2018 in [facility] Care at [facility] Total Score 6 5 0 0 0  PHQ-9 Total Score 24 21 -- -- --      Flowsheet Row ED to Hosp-Admission (Discharged) from 11/09/2022 in [facility] (1A) ED from 10/18/2022 in [facility] Department at [facility] ED to Hosp-Admission (Discharged) from 05/23/2022 in [facility] 1C MEDICAL TELEMETRY  C-SSRS RISK CATEGORY No Risk No Risk No Risk        Assessment and Plan:  Kimmy Pipitone is a 67 y.o. year old male with a history of depression, memory concern with hydrocephalus status post VP shunt placement + history of repeated head trauma in setting of falls, diabetic peripheral neuropathy, hypertension, OSA on CPAP,  who is referred for depression.   1. Major depressive disorder, recurrent episode, moderate (HCC) Acute stressors include: s/p vertebral fracture Other stressors include: unsteady gait secondary to hydrocephalus s/p  shunt placement, diabetic neuropathy, losses of his close parents, brother several years ago, unemployment    History:  no prior history of depression prior to hydrocephalus   There has been significant worsening in depressive symptoms with hypersomnia in the context of stressors/recent mechanical fall, which led to vertebral fracture.  Duloxetine was tapered down by his primary care due to concern of nausea.  He is willing to try switching to fluoxetine to see if it more effective.  Discussed potential GI side effect.  Will continue current dose of bupropion at this time to target depression.  Noted that although they previously reports some benefit from bupropion, they denied this on today's evaluation.  Will consider tapering off the medication in the future as his mood improves.  He will greatly benefit from CBT/supportive therapy; he will continue to see his therapist.   # cognitive impairment - on donepezil, prescribed by neurology - hydrocephalus status post VP shunt placement + history of repeated head trauma in setting of fall - neuropsych testing in May 2022- mild neurocognitive disorder, adjustment disorder. Per note, "your mild neurocognitive disorder is most likely related to your history of normal pressure hydrocephalus and shunting."  It  is multifactorial, and he does have mood symptoms as above.  Will continue to assess.    Plan Start fluoxetine 20 mg daily  Discontinue duloxetine after one week of starting fluoxetine Continue bupropion 450 mg daily  Next appointment: 10/24 at 1pm for 30 mins, IP - on monjaro (not taking it lately) - on trazodone, gabapentin 900 mg tid   The patient demonstrates the following risk factors for suicide: Chronic risk factors for suicide include: psychiatric disorder of depression and chronic pain. Acute risk factors for suicide include: unemployment and loss (financial, interpersonal, professional). Protective factors for this patient include: positive  social support, coping skills, and hope for the future. Considering these factors, the overall suicide risk at this point appears to be low. Patient is appropriate for outpatient follow up.  Collaboration of Care: Collaboration of Care: Other reviewed notes in Epic  Patient/Guardian was advised Release of Information must be obtained prior to any record release in order to collaborate their care with an outside provider. Patient/Guardian was advised if they have not already done so to contact the registration department to sign all necessary forms in order for [name] to release information regarding their care.   Consent: Patient/Guardian gives verbal consent for treatment and assignment of benefits for services provided during this visit. Patient/Guardian expressed understanding and agreed to proceed.    Neysa Hotter, MD 01/13/2023, 5:10 PM

## 2023-01-13 ENCOUNTER — Encounter: Payer: Self-pay | Admitting: Psychiatry

## 2023-01-13 ENCOUNTER — Ambulatory Visit (INDEPENDENT_AMBULATORY_CARE_PROVIDER_SITE_OTHER): Payer: Medicare HMO | Admitting: Psychiatry

## 2023-01-13 VITALS — BP 132/86 | HR 101 | Temp 95.0°F | Ht >= 80 in | Wt 318.0 lb

## 2023-01-13 DIAGNOSIS — F331 Major depressive disorder, recurrent, moderate: Secondary | ICD-10-CM

## 2023-01-13 MED ORDER — FLUOXETINE HCL 20 MG PO CAPS: 20.0000 mg | ORAL_CAPSULE | Freq: Every day | ORAL | 1 refills | Status: DC

## 2023-01-13 NOTE — Patient Instructions (Signed)
Start fluoxetine 20 mg daily  Discontinue duloxetine after one week of starting fluoxetine Continue bupropion 450 mg daily  Next appointment: 10/24 at 1pm

## 2023-01-14 NOTE — Telephone Encounter (Signed)
Note opened in error please disregard.  Thresa Ross PT, DPT

## 2023-01-21 ENCOUNTER — Ambulatory Visit (INDEPENDENT_AMBULATORY_CARE_PROVIDER_SITE_OTHER): Payer: Medicare HMO | Admitting: Clinical

## 2023-01-21 DIAGNOSIS — F331 Major depressive disorder, recurrent, moderate: Secondary | ICD-10-CM | POA: Diagnosis not present

## 2023-01-21 NOTE — Progress Notes (Addendum)
[facility] Counselor/Therapist Progress Note  Patient ID: Nathan Dean, MRN: [id number]    Date: 01/21/23  Time Spent: 8:34  am - 9:13 am : 39 Minutes  Treatment Type: Individual Therapy.  Reported Symptoms: Patient reported recent improvement in mood.  Mental Status Exam: Appearance:  Neat and Well Groomed     Behavior: Appropriate  Motor: Normal  Speech/Language:  Clear and Coherent  Affect: Appropriate  Mood: normal  Thought process: normal  Thought content:   WNL  Sensory/Perceptual disturbances:   WNL  Orientation: oriented to person, place, and situation  Attention: Good  Concentration: Good  Memory: WNL  Fund of knowledge:  Good  Insight:   Fair  Judgment:  Good  Impulse Control: Good   Risk Assessment: Danger to Self:  No Patient denied current suicidal ideation  Self-injurious Behavior: No Danger to Others: No Patient denied current homicidal ideation Duty to Warn:no Physical Aggression / Violence:No  Access to Firearms a concern: No  Gang Involvement:No   Subjective:  Patient reported during a recent appointment with his psychiatrist, patient's psychiatrist changed patient's medications. Patient's wife reported patient's psychiatrist discontinued a medication and added Prozac. Patient's wife reported she has observed a slight improvement in patient since the addition of Prozac. Patient's wife reported patient has asked to venture out of the house. Patient reported he experienced a recent fall and the fire department assisted patient in response. Patient's wife reported prior to patient's recent appointment with the psychiatrist, wife was finding patient in the bedroom crying frequently. Patient reported he is still receiving physical therapy and experiencing back and shoulder pain. Patient reported "improvement" in mood and reported "happy because I'm going forward" in response to patient's mood.  Patient stated, "getting better quicker" in response  to potential goals for therapy. Patient stated, "get back to somewhat normal" in response to potential goals for therapy and reported he would like to help with household chores, going to the grocery store, and would like to take his wife out.  Interventions: Motivational Interviewing. Clinician conducted session via caregility video from clinician's home office. Patient provided verbal consent to proceed with telehealth session and is aware of limitations of telephone or video visits. Patient provided verbal consent for patient's wife to participate in session. Patient participated in session from patient's home. Discussed recent symptoms and patient's recent appointment with psychiatrist. Clinician utilized motivational interviewing to explore potential goals for therapy. Clinician utilized a task centered approach in collaboration with patient to begin to develop goals for therapy. Provided reflective listening.   Collaboration of Care: Other not required at this time  Diagnosis:  Major depressive disorder, recurrent episode, moderate (HCC)   Plan: Patient is to utilize [employer] [facility], thought re-framing, behavioral activation, mindfulness and coping strategies to decrease symptoms associated with their diagnosis. Frequency: bi-weekly  Modality: individual     Long-term goal:   To be determined during follow up appointment   Short-term goal:  Implement mindfulness exercises to increase patient's focus on the present Target Date: 07/21/23  Progress: 0   Increase patient's daily activities, such as, household chores, going to the grocery store, participating in activities outside of the house with patient's wife.   Target Date: 07/21/23  Progress: 0   Goals to be finalized during follow up appointment on 01/31/23.                   Doree Barthel, LCSW

## 2023-01-31 ENCOUNTER — Ambulatory Visit (INDEPENDENT_AMBULATORY_CARE_PROVIDER_SITE_OTHER): Payer: Medicare HMO | Admitting: Clinical

## 2023-01-31 DIAGNOSIS — F331 Major depressive disorder, recurrent, moderate: Secondary | ICD-10-CM | POA: Diagnosis not present

## 2023-01-31 NOTE — Progress Notes (Signed)
[facility] Counselor/Therapist Progress Note  Patient ID: Nathan Dean, MRN: [id number],    Date: 01/31/2023  Time Spent: 1:35pm - 2:20pm : 45 minutes   Treatment Type: Individual Therapy  Reported Symptoms: Patient stated his mood is a "work in progress".   Mental Status Exam: Appearance:  Neat and Well Groomed     Behavior: Appropriate  Motor: Normal  Speech/Language:  Clear and Coherent  Affect: Appropriate  Mood: normal  Thought process: normal  Thought content:   WNL  Sensory/Perceptual disturbances:   WNL  Orientation: oriented to person, place, and situation  Attention: Good  Concentration: Good  Memory: WNL  Fund of knowledge:  Good  Insight:   Fair  Judgment:  Good  Impulse Control: Good   Risk Assessment: Danger to Self:  No Patient denied current suicidal ideation  Self-injurious Behavior: No Danger to Others: No Patient denied current homicidal ideation Duty to Warn:no Physical Aggression / Violence:No  Access to Firearms a concern: No  Gang Involvement:No   Subjective: Patient reported the recovery from patient's recent back procedure is going well. Patient reported he has recently developed neck pain and reported he has an appointment today with an acupuncturist to treat the neck pain. Patient reported he continues to experience falls. Patient reported he is currently receiving physical therapy. Patient's wife reported she witnessed positive changes in patient's mood after starting new medication but reported patient has experienced nausea/vomiting and they have discontinued the new medication for a short duration of time. Patient's wife reported she reached out to patient's psychiatrist to inform psychiatrist of nausea/vomiting and psychiatrist recommended discontinuing the new medication for a duration of time. Patient stated, "its a work in progress" in response to patient's mood since last session. Patient reported he does not disclose his  thoughts/feelings to patient's wife.    Interventions: Motivational Interviewing. Clinician conducted session via caregility video from clinician's home office. Patient provided verbal consent to proceed with telehealth session and is aware of limitations of telephone or video visits. Patient's wife was present during a portion of today's session and patient provided verbal consent for patient's wife to participate in session. Patient participated in session from patient's home. Reviewed events since last session. Assessed patient's mood since last session and assessed patient's current mood. Clinician utilized motivational interviewing to explore additional goals for therapy. Provided psycho education related to goals, therapy, and maintaining a thought record. Clinician utilized a task centered approach in collaboration with patient to finalize goals for therapy. Patient participated in development of goals and agreed to goals for therapy. Clinician requested for homework patient complete a thought record.    Collaboration of Care: Other not required at this time  Diagnosis:  Major depressive disorder, recurrent episode, moderate (HCC)     Plan: Patient is to utilize [employer], thought re-framing, behavioral activation, mindfulness and coping strategies to decrease symptoms associated with their diagnosis. Frequency: bi-weekly  Modality: individual      Long-term goal:   Reduce overall level, frequency, and intensity of the feelings of depression as evidenced by decrease in isolation, depressed mood,  lack of energy, fatigue, lack of motivation, loss of interest, difficulty concentrating, difficulty falling asleep at night, and sleeping 12-14 hours per day from 7 days/week to 0 to 1 days/week per patient's report for at least 3 consecutive months.  Target Date: 01/31/24  Progress: 0    Short-term goal:  Implement mindfulness exercises to increase patient's focus on the  present Target Date: 07/21/23  Progress: 0    Increase patient's daily activities, such as, household chores, going to the grocery store, participating in activities outside of the house with patient's wife.   Target Date: 07/21/23  Progress: 0    Verbalize patient's thoughts and feelings to wife through use of effective communication strategies per patient's report Target Date: 07/21/23  Progress: 0                     Doree Barthel, LCSW

## 2023-01-31 NOTE — Progress Notes (Signed)
                Dezi Schaner, LCSW 

## 2023-02-05 ENCOUNTER — Other Ambulatory Visit: Payer: Self-pay | Admitting: Psychiatry

## 2023-02-14 ENCOUNTER — Ambulatory Visit (INDEPENDENT_AMBULATORY_CARE_PROVIDER_SITE_OTHER): Payer: Medicare HMO | Admitting: Clinical

## 2023-02-14 DIAGNOSIS — F331 Major depressive disorder, recurrent, moderate: Secondary | ICD-10-CM

## 2023-02-14 NOTE — Progress Notes (Signed)
[facility] Counselor/Therapist Progress Note  Patient ID: Nathan Dean, MRN: [id number],    Date: 02/14/2023  Time Spent: 12:33pm - 1:15pm : 42 minutes   Treatment Type: Individual Therapy  Reported Symptoms: Patient reported decreased activity level  Mental Status Exam: Appearance:  Neat and Well Groomed     Behavior: Appropriate  Motor: Normal  Speech/Language:  Clear and Coherent  Affect: Appropriate  Mood: normal  Thought process: normal  Thought content:   WNL  Sensory/Perceptual disturbances:   WNL  Orientation: oriented to person, place, and situation  Attention: Good  Concentration: Good  Memory: WNL  Fund of knowledge:  Good  Insight:   Fair  Judgment:  Good  Impulse Control: Good   Risk Assessment: Danger to Self:  No Patient denied current suicidal ideation  Self-injurious Behavior: No Danger to Others: No Patient denied current homicidal ideation Duty to Warn:no Physical Aggression / Violence:No  Access to Firearms a concern: No  Gang Involvement:No   Subjective: Patient stated, "my homework I didn't do". Patient reported he has been experiencing severe pain in his neck and experiencing headaches. Patient reported he has been receiving acupuncture to treat neck pain. Patient reported difficulty sleeping due neck pain. Patient stated, "it bothers me to have so much pain".  Patient reported he spends 9 hours per day in his chair and spends time on the computer. Patient's wife reported patient's minimal activity level is contributing to patient experiencing pain in patient's hip and neck. Patient's wife reported patient no longer wants to leave the house to participate in activities patient previously enjoyed. Patient reported feelings of frustration in response to pain. Patient reported he spends a significant amount of time researching topics on the computer, using social media, and having phone conversations each day. Patient stated, "I feel a lot  more at ease" in regards to patient spending time outside. Patient reported it bothers patient when he has to rely on his wife to provide transportation. Patient reported patient not currently being able to drive is a barrier to patient leaving the home.   Interventions: Cognitive Behavioral Therapy. Clinician conducted session via caregility video from clinician's home office. Patient provided verbal consent to proceed with telehealth session and is aware of limitations of telephone or video visits. Patient's wife was present during a portion of today's session to provide collateral information and patient provided verbal consent for patient's wife to participate in session. Patient participated in session from patient's home. Discussed patient's homework and explored barriers to patient completing homework assignment. Explored the impact of pain on patient's daily activities. Assisted patient in discussing and identifying thoughts/feelings triggered by chronic pain. Explored patient's daily activities/routine. Provided psycho education related to the importance of self care, depression, and the benefit of participation in enjoyable activities on mood. Discussed barriers to patient participating in activities outside of the home. Discussed setting a goal of spending at least 15 minutes outside each day and participating in one activity outside of the home each week. Provided psycho education related to opposite action. Clinician requested for homework patient complete a thought record.   Collaboration of Care: Other not required at this time   Diagnosis:  Major depressive disorder, recurrent episode, moderate (HCC)     Plan: Patient is to utilize [employer], thought re-framing, behavioral activation, mindfulness and coping strategies to decrease symptoms associated with their diagnosis. Frequency: bi-weekly  Modality: individual      Long-term goal:   Reduce overall level,  frequency, and  intensity of the feelings of depression as evidenced by decrease in isolation, depressed mood,  lack of energy, fatigue, lack of motivation, loss of interest, difficulty concentrating, difficulty falling asleep at night, and sleeping 12-14 hours per day from 7 days/week to 0 to 1 days/week per patient's report for at least 3 consecutive months.  Target Date: 01/31/24  Progress: progressing      Short-term goal:  Implement mindfulness exercises to increase patient's focus on the present Target Date: 07/21/23  Progress: progressing    Increase patient's daily activities, such as, household chores, going to the grocery store, participating in activities outside of the house with patient's wife.   Target Date: 07/21/23  Progress: progressing    Verbalize patient's thoughts and feelings to wife through use of effective communication strategies per patient's report Target Date: 07/21/23  Progress: progressing                    Doree Barthel, LCSW

## 2023-02-14 NOTE — Progress Notes (Signed)
                Dezi Schaner, LCSW 

## 2023-02-15 NOTE — Telephone Encounter (Signed)
Dr. Hisada's pt 

## 2023-02-16 NOTE — Progress Notes (Unsigned)
BH MD/PA/NP OP Progress Note  02/17/2023 5:38 PM Nathan Dean  MRN:  [id number]  Chief Complaint:  Chief Complaint  Patient presents with   Follow-up   HPI:  This is a follow-up appointment for depression.  The appointment was made sooner due to concern of worsening in depression.   Nathan Dean helps to elaborate the story.  She thinks his depression has been getting worse.  She used to be like her verbal punching bag for him when he struggled with depression, and she thinks it has been coming back more severly. He has anger towards her, and it has been hard to take at times.  He does not take a shower, or keep himself clean.  He just goes from bed to chair.  He continues to have diarrhea and nausea, although it has improved since discontinuation of fluoxetine.  He tried only for about a week, and has discontinued longer than a week ago.   Nathan Dean agrees with Nathan Dean says.  Although he does not see the need to take a shower every day, he will collaborates that he does not care as much compared to before.  He feels more depressed.  He does have a momentum to do things.  He has change in taste and reports decrease in appetite.  He struggles with pain during the day and at night, which causes him insomnia.  He is planning to see emerge also for his neck pain.  He feels anxious about surgery as he is afraid that it might affect his shunt/tubing. The patient has mood symptoms as in PHQ-9/GAD-7. He denies SI.  He denies alcohol use or drug use.     Wt Readings from Last 3 Encounters:  02/17/23 (!) 321 lb (145.6 kg)  01/13/23 (!) 318 lb (144.2 kg)  11/11/22 (!) 353 lb 9.9 oz (160.4 kg)    Visit Diagnosis:    ICD-10-CM   1. Major depressive disorder, recurrent episode, moderate (HCC)  F33.1       Past Psychiatric History: Please see initial evaluation for full details. I have reviewed the history. No updates at this time.     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    Diabetes (HCC)    GERD (gastroesophageal reflux disease)    Hyperlipidemia    Hypertension    Obesity    Sleep apnea     Past Surgical History:  Procedure Laterality Date   BACK SURGERY     in the 80's -herniated disc   BACK SURGERY     L4 bone spur 1990   CARPAL TUNNEL RELEASE Right 12/28/2016   Procedure: RIGHT ULNAR AND MEDIAN NEUROPLASTY AT WRIST;  Surgeon: Mack Hook, MD;  Location: [facility];  Service: Orthopedics;  Laterality: Right;   CARPAL TUNNEL RELEASE Left    25 years ago   CATARACT EXTRACTION W/ INTRAOCULAR LENS IMPLANT Bilateral    REPLACEMENT TOTAL KNEE BILATERAL  2014   left 2012 rt 2014   SHOULDER OPEN ROTATOR CUFF REPAIR Right 08/07/2019   Procedure: ROTATOR CUFF REPAIR SHOULDER OPEN;  Surgeon: Juanell Fairly, MD;  Location: [facility];  Service: Orthopedics;  Laterality: Right;   SKIN GRAFT Left 06/19/2022   left side of neck for skin cancer on top of head   TONSILLECTOMY      Family Psychiatric History: Please see initial evaluation for full details. I have reviewed the history. No updates at  this time.     Family History:  Family History  Problem Relation Age of Onset   Ovarian cancer Mother    Kidney failure Father    Cancer Brother     Social History:  Social History   Socioeconomic History   Marital status: Married    Spouse name: Not on file   Number of children: 0   Years of education: Not on file   Highest education level: Associate degree: academic program  Occupational History   Occupation: Currently Working   Tobacco Use   Smoking status: Former    Current packs/day: 0.00    Average packs/day: 2.0 packs/day for 20.0 years (40.0 ttl pk-yrs)    Types: Cigarettes    Start date: 06/28/1976    Quit date: 06/28/1996    Years since quitting: 26.6   Smokeless tobacco: Never  Vaping Use   Vaping status: Never Used  Substance and Sexual  Activity   Alcohol use: Yes    Comment: occasionally   Drug use: No   Sexual activity: Not on file    Comment: not asked if sexually active  Other Topics Concern   Not on file  Social History Narrative   Right handed    Lives at home spouse    Caffeine 5 -6 cups     Social Determinants of Health   Financial Resource Strain: Low Risk  (05/27/2022)   Received from [facility], [facility]   Overall Financial Resource Strain (CARDIA)    Difficulty of Paying Living Expenses: Not very hard  Food Insecurity: No Food Insecurity (11/09/2022)   Hunger Vital Sign    Worried About Running Out of Food in the Last Year: Never true    Ran Out of Food in the Last Year: Never true  Transportation Needs: No Transportation Needs (11/09/2022)   PRAPARE - Administrator, Civil Service (Medical): No    Lack of Transportation (Non-Medical): No  Physical Activity: Inactive (02/26/2020)   Received from [facility], [facility]   Exercise Vital Sign    Days of Exercise per Week: 0 days    Minutes of Exercise per Session: 0 min  Stress: No Stress Concern Present (02/26/2020)   Received from [facility], [facility]   [employer] of Occupational Health - Occupational Stress Questionnaire    Feeling of Stress : Not at all  Social Connections: Unknown (09/25/2021)   Received from [facility], [facility]   Social Network    Social Network: Not on file    Allergies:  Allergies  Allergen Reactions   Penicillins Anaphylaxis, Rash and Other (See Comments)   Succinylcholine Chloride Anaphylaxis and Rash   Semaglutide Diarrhea   Keflex [Cephalexin] Rash   Sulfa Antibiotics Rash and Other (See Comments)    Metabolic Disorder Labs: Lab Results  Component Value Date   HGBA1C 7.5 (H) 05/23/2022   MPG 169 05/23/2022   MPG 148.46 08/07/2019   No results found for:  "PROLACTIN" Lab Results  Component Value Date   CHOL 199 11/04/2016   TRIG 405 (H) 11/04/2016   HDL 48 11/04/2016   CHOLHDL 4.1 11/04/2016   LDLCALC Comment 11/04/2016   Lab Results  Component Value Date   TSH 1.062 08/16/2019   TSH 1.050 12/23/2014    Therapeutic Level Labs: No results found for: "LITHIUM" No results found for: "VALPROATE" No results found for: "CBMZ"  Current Medications: Current  Outpatient Medications  Medication Sig Dispense Refill   albuterol (VENTOLIN HFA) 108 (90 Base) MCG/ACT inhaler Inhale 2 puffs into the lungs every 4 (four) hours as needed.     aspirin EC 81 MG tablet Take 81 mg by mouth daily.     buPROPion (WELLBUTRIN XL) 300 MG 24 hr tablet Take 450 mg by mouth daily.     carvedilol (COREG) 12.5 MG tablet Take 1 tablet (12.5 mg total) by mouth 2 (two) times daily with a meal.     cholecalciferol (VITAMIN D3) 25 MCG (1000 UNIT) tablet Take 2,000 Units by mouth daily.     cyanocobalamin (VITAMIN B12) 1000 MCG tablet Take 5,000 mcg by mouth daily.     gabapentin (NEURONTIN) 300 MG capsule Take 3 capsules (900 mg total) by mouth 3 (three) times daily.     HYDROcodone-acetaminophen (NORCO/VICODIN) 5-325 MG tablet Take 1 tablet by mouth every 6 (six) hours as needed for moderate pain. 20 tablet 0   insulin glargine (LANTUS) 100 UNIT/ML injection Inject 30 Units into the skin daily.     insulin lispro (HUMALOG) 100 UNIT/ML injection Inject 18 Units into the skin in the morning and at bedtime. 18 in am. 8 in pm     ketoconazole (NIZORAL) 2 % cream Apply 1 Application topically 2 (two) times daily.     loratadine (CLARITIN) 10 MG tablet Take 10 mg by mouth daily as needed.     losartan (COZAAR) 100 MG tablet Take 1 tablet (100 mg total) by mouth daily. 30 tablet 0   magnesium oxide (MAG-OX) 400 (240 Mg) MG tablet Take 500 mg by mouth daily.     metFORMIN (GLUCOPHAGE) 500 MG tablet Take 1,000 mg by mouth 2 (two) times daily with a meal.     methocarbamol  (ROBAXIN) 750 MG tablet Take 750 mg by mouth 4 (four) times daily.     mometasone (ELOCON) 0.1 % cream Apply 1 Application topically daily.     omeprazole (PRILOSEC) 20 MG capsule Take 40 mg by mouth 2 times daily at 12 noon and 4 pm.     ondansetron (ZOFRAN-ODT) 4 MG disintegrating tablet Take 8 mg by mouth every 8 (eight) hours as needed for nausea.     rosuvastatin (CRESTOR) 20 MG tablet Take 20 mg by mouth daily.     senna-docusate (SENOKOT-S) 8.6-50 MG tablet Take 2 tablets by mouth daily.     sertraline (ZOLOFT) 50 MG tablet 25 mg at night for one week, then 50 mg at night 90 tablet 0   tirzepatide (MOUNJARO) 2.5 MG/0.5ML Pen Inject 2.5 mg into the skin once a week.     traZODone (DESYREL) 50 MG tablet Take 1 tablet by mouth at bedtime.     donepezil (ARICEPT) 10 MG tablet Take 10 mg by mouth at bedtime.     No current facility-administered medications for this visit.     Musculoskeletal: Strength & Muscle Tone: within normal limits Gait & Station:  uses a walker Patient leans: N/A  Psychiatric Specialty Exam: Review of Systems  Psychiatric/Behavioral:  Positive for decreased concentration, dysphoric mood and sleep disturbance. Negative for agitation, behavioral problems, confusion, hallucinations, self-injury and suicidal ideas. The patient is nervous/anxious. The patient is not hyperactive.   All other systems reviewed and are negative.   Blood pressure (!) 177/83, pulse 80, temperature (!) 97.5 F (36.4 C), temperature source Skin, height 6\' 9"  (2.057 m), weight (!) 321 lb (145.6 kg).Body mass index is 34.4 kg/m.  General Appearance:  Well Groomed  Eye Contact:  Good  Speech:  Clear and Coherent  Volume:  Normal  Mood:  Depressed  Affect:  Appropriate, Congruent, and slightly down  Thought Process:  Coherent  Orientation:  Full (Time, Place, and Person)  Thought Content: Logical   Suicidal Thoughts:  No  Homicidal Thoughts:  No  Memory:  Immediate;   Good  Judgement:   Good  Insight:  Good  Psychomotor Activity:  Normal  Concentration:  Concentration: Good and Attention Span: Good  Recall:  Good  Fund of Knowledge: Good  Language: Good  Akathisia:  No  Handed:  Right  AIMS (if indicated): not done  Assets:  Communication Skills Desire for Improvement  ADL's:  Intact  Cognition: WNL  Sleep:  Poor   Screenings: GAD-7    Flowsheet Row Office Visit from 02/17/2023 in [facility] Office Visit from 01/13/2023 in [facility] Office Visit from 09/28/2022 in [facility]  Total GAD-7 Score 9 11 12       PHQ2-9    Flowsheet Row Office Visit from 02/17/2023 in [facility] Office Visit from 01/13/2023 in [facility] Office Visit from 09/28/2022 in [facility] Procedure visit from 05/12/2022 in [facility] Procedure visit from 09/16/2021 in [facility] Total Score 6 6 5  0 0  PHQ-9 Total Score 16 24 21  -- --      Flowsheet Row ED to Hosp-Admission (Discharged) from 11/09/2022 in [facility] (1A) ED from 10/18/2022 in [facility] at [facility] ED to Hosp-Admission (Discharged) from 05/23/2022 in [facility] 1C MEDICAL TELEMETRY  C-SSRS RISK CATEGORY No Risk No Risk No Risk        Assessment and Plan:  Nathan Dean is a 67 y.o. year old male with a history of depression, memory concern with hydrocephalus status post VP shunt placement + history of repeated head trauma in setting of falls, diabetic peripheral neuropathy, hypertension, OSA on CPAP,  who is referred for depression.   1. Major depressive disorder,  recurrent episode, moderate (HCC) Acute stressors include: s/p vertebral fracture Other stressors include: unsteady gait secondary to hydrocephalus s/p shunt placement, diabetic neuropathy, losses of his close parents, brother several years ago, unemployment    History:  no prior history of depression prior to hydrocephalus   There has been worsening in depressive symptoms, and he could not tolerate duloxetine due to worsening in diarrhea and nausea.  He is willing to try sertraline to target depression.  Discussed potential risk of GI side effects.  This medication is chosen given his comorbidity of hypertension, and concern of weight gain. .  Noted that although they previously reports some benefit from bupropion, they denied this on today's evaluation.  Will consider tapering off the medication in the future as his mood improves.  He will greatly benefit from CBT/supportive therapy; he will continue to see his therapist.   # cognitive impairment - on donepezil, prescribed by neurology - hydrocephalus status post VP shunt placement + history of repeated head trauma in setting of fall - neuropsych testing in May 2022- mild neurocognitive disorder, adjustment disorder. Per note, "your mild neurocognitive disorder is most Unchanged. likely related to your history of normal pressure hydrocephalus and shunting."  It is  multifactorial, and he does have mood symptoms as above.  Will continue to assess.    Plan Hold fluoxetine Start sertraline 25 mg at night for one week, then 50 mg at night  Continue bupropion 450 mg daily  Next appointment: 11/25 at 1:30, IP - on monjaro (not taking it lately) - on trazodone, gabapentin 900 mg tid - TSH, fT4 wnl 12/2022  Past trials: fluoxetine (diarrhea, nausea)   The patient demonstrates the following risk factors for suicide: Chronic risk factors for suicide include: psychiatric disorder of depression and chronic pain. Acute risk factors for suicide include:  unemployment and loss (financial, interpersonal, professional). Protective factors for this patient include: positive social support, coping skills, and hope for the future. Considering these factors, the overall suicide risk at this point appears to be low. Patient is appropriate for outpatient follow up.    Collaboration of Care: Collaboration of Care: Other reviewed notes in Epic  Patient/Guardian was advised Release of Information must be obtained prior to any record release in order to collaborate their care with an outside provider. Patient/Guardian was advised if they have not already done so to contact the registration department to sign all necessary forms in order for [name] to release information regarding their care.   Consent: Patient/Guardian gives verbal consent for treatment and assignment of benefits for services provided during this visit. Patient/Guardian expressed understanding and agreed to proceed.    Neysa Hotter, MD 02/17/2023, 5:38 PM

## 2023-02-17 ENCOUNTER — Ambulatory Visit (INDEPENDENT_AMBULATORY_CARE_PROVIDER_SITE_OTHER): Payer: Medicare HMO | Admitting: Psychiatry

## 2023-02-17 ENCOUNTER — Encounter: Payer: Self-pay | Admitting: Psychiatry

## 2023-02-17 VITALS — BP 177/83 | HR 80 | Temp 97.5°F | Ht >= 80 in | Wt 321.0 lb

## 2023-02-17 DIAGNOSIS — F331 Major depressive disorder, recurrent, moderate: Secondary | ICD-10-CM

## 2023-02-17 MED ORDER — SERTRALINE HCL 50 MG PO TABS: ORAL_TABLET | ORAL | 0 refills | Status: DC

## 2023-02-17 NOTE — Patient Instructions (Signed)
Hold fluoxetine Start sertraline 25 mg at night for one week, then 50 mg at night  Continue bupropion 450 mg daily  Next appointment: 11/25 at 1:30

## 2023-02-28 ENCOUNTER — Ambulatory Visit (INDEPENDENT_AMBULATORY_CARE_PROVIDER_SITE_OTHER): Payer: Medicare HMO | Admitting: Clinical

## 2023-02-28 DIAGNOSIS — F331 Major depressive disorder, recurrent, moderate: Secondary | ICD-10-CM

## 2023-02-28 NOTE — Progress Notes (Signed)
[facility] Counselor/Therapist Progress Note  Patient ID: Nathan Dean, MRN: [id number],    Date: 02/28/2023  Time Spent: 1:33pm - 2:15pm : 42 minutes   Treatment Type: Individual Therapy  Reported Symptoms: It was reported there has been recent improvement in patient's mood.   Mental Status Exam: Appearance:  Neat and Well Groomed     Behavior: Appropriate  Motor: Normal  Speech/Language:  Clear and Coherent  Affect: Appropriate  Mood: normal  Thought process: normal  Thought content:   WNL  Sensory/Perceptual disturbances:   WNL  Orientation: oriented to person, place, and situation  Attention: Good  Concentration: Good  Memory: WNL  Fund of knowledge:  Good  Insight:   Fair  Judgment:  Good  Impulse Control: Good   Risk Assessment: Danger to Self:  No Patient denied current suicidal ideation  Self-injurious Behavior: No Danger to Others: No Patient denied current homicidal ideation Duty to Warn:no Physical Aggression / Violence:No  Access to Firearms a concern: No  Gang Involvement:No   Subjective: Patient stated, "still having a lot of issues with my neck" and reported he plans to discontinue acupuncture.  Patient reported he recently spoke with a representative from the state of [place] regarding patient's drivers license, and patient reported patient is required to provide results from his recent vision exam. Patient reported he went for a ride with his wife yesterday and the day prior. Patient reported yesterday patient/wife went to get ice cream and stated "it was really nice".  Patient's wife stated, "I'm seeing a slight improvement in mood" and patient's wife reported the past couple of days patient's mood has been more cheerful. Patient stated, "I feel good, I think I came out of that egg shell" in response to patient's mood. Patient reported he enjoys spending time outside. Patient reported he has been going outside every other day and  plans to go outside after today's session to spend time with his dog. Patient stated,  "I get interested in subjects on my computer and passes the time" in response to barriers to spending time outside daily. Patient stated, "pretty good" in response to patient's thought record. Patient stated,  "I can do that" in response to use of a gratitude journal. During today's session, clinician requested patient identify three areas of gratitude and patient stated, "living, my wife, my home". In addition, patient reported he is grateful for his pets, entertainment his pets provide to patient, and pets' companionship.   Interventions: Cognitive Behavioral Therapy. Clinician conducted session via caregility video from clinician's home office. Patient provided verbal consent to proceed with telehealth session and is aware of limitations of telephone or video visits. Patient's wife was present during a portion of today's session to provide collateral information and patient provided verbal consent for patient's wife to participate in session. Patient participated in session from patient's home. Reviewed events since last session. Discussed patient's thoughts/feelings related to his driver's license. Assessed patient's mood since last session and assessed current mood. Assessed the status of patient's goal to spend at least 15 minutes outside each day and participating in one activity outside of the home each week. Explored and identified barriers to patient spending 15 minutes outside each day. Provided psycho education related to depression and activity level. Reviewed patient's thought record. Provided psycho education related to gratitude exercises and maintaining a gratitude journal. Assisted patient in identifying additional areas of gratitude. Clinician requested for homework patient continue thought record.     Collaboration of Care:  Other not required at this time   Diagnosis:  Major depressive disorder,  recurrent episode, moderate (HCC)     Plan: Patient is to utilize [employer] Therapy, thought re-framing, behavioral activation, mindfulness and coping strategies to decrease symptoms associated with their diagnosis. Frequency: bi-weekly  Modality: individual      Long-term goal:   Reduce overall level, frequency, and intensity of the feelings of depression as evidenced by decrease in isolation, depressed mood,  lack of energy, fatigue, lack of motivation, loss of interest, difficulty concentrating, difficulty falling asleep at night, and sleeping 12-14 hours per day from 7 days/week to 0 to 1 days/week per patient's report for at least 3 consecutive months.  Target Date: 01/31/24  Progress: progressing      Short-term goal:  Implement mindfulness exercises to increase patient's focus on the present Target Date: 07/21/23  Progress: progressing    Increase patient's daily activities, such as, household chores, going to the grocery store, participating in activities outside of the house with patient's wife.   Target Date: 07/21/23  Progress: progressing    Verbalize patient's thoughts and feelings to wife through use of effective communication strategies per patient's report Target Date: 07/21/23  Progress: progressing                  Doree Barthel, LCSW

## 2023-02-28 NOTE — Progress Notes (Signed)
                Dezi Schaner, LCSW 

## 2023-03-04 ENCOUNTER — Institutional Professional Consult (permissible substitution): Payer: Medicare HMO | Admitting: Adult Health

## 2023-03-07 ENCOUNTER — Ambulatory Visit: Payer: Medicare HMO | Admitting: Clinical

## 2023-03-10 ENCOUNTER — Telehealth: Payer: Medicare HMO | Admitting: Psychiatry

## 2023-03-14 ENCOUNTER — Ambulatory Visit (INDEPENDENT_AMBULATORY_CARE_PROVIDER_SITE_OTHER): Payer: Medicare HMO | Admitting: Clinical

## 2023-03-14 DIAGNOSIS — F331 Major depressive disorder, recurrent, moderate: Secondary | ICD-10-CM | POA: Diagnosis not present

## 2023-03-14 NOTE — Progress Notes (Signed)
                Dezi Schaner, LCSW 

## 2023-03-14 NOTE — Progress Notes (Signed)
[facility] Counselor/Therapist Progress Note  Patient ID: Nathan Dean, MRN: [id number],    Date: 03/14/2023  Time Spent: 10:31am - 11:18am : 47 minutes   Treatment Type: Individual Therapy  Reported Symptoms: none reported  Mental Status Exam: Appearance:  Neat and Well Groomed     Behavior: Appropriate  Motor: Normal  Speech/Language:  Clear and Coherent  Affect: Appropriate  Mood: normal  Thought process: normal  Thought content:   WNL  Sensory/Perceptual disturbances:   WNL  Orientation: oriented to person, place, and situation  Attention: Good  Concentration: Good  Memory: WNL  Fund of knowledge:  Good  Insight:   Fair  Judgment:  Good  Impulse Control: Good   Risk Assessment: Danger to Self:  No Patient denied current suicidal ideation  Self-injurious Behavior: No Danger to Others: No Patient denied current homicidal ideation Duty to Warn:no Physical Aggression / Violence:No  Access to Firearms a concern: No  Gang Involvement:No   Subjective: Patient stated, "I was sick last week, I guess I'm doing ok". Patient stated, "its been pretty good" in response to mood since last session. Patient reported he has been spending time outside every other day for approximately half an hour. Patient reported he recently went with his wife to get patient's glasses. Patient reported he is unable to sit on his rollator for long periods of time when patient is out in the community and causes patient to experience pain in his neck. Patient stated, "I don't want to keep on bothering Britta Mccreedy (wife) from work to take me to go some place". Patient stated, "not driving in 5 months is putting a hindrance on my head".  Patient reported he recently received the form from his eye doctor to submit for his drivers license. Patient stated, "I feel like I'm somewhat getting back to semi normal" in response to patient's mood when getting out of the house. Patient stated, "I feel  different when I go outside". Patient reported he misses driving.  Patient stated, "everything's been quiet" and reported no entries in patient's thought record. Patient reported when he goes to sleep "my mind goes backwards instead of forward" in reference to thoughts about the past. Patient reported he lost his brother 12 years ago and his parents 50 years ago. Patient reported he experiences difficulty talking about his brother. Patient stated, "family is more important to me than anything else". Patient reported thoughts about the past daily. Patient reported he doesn't want to "burden" his wife and stated, "that burden bothers me a lot".     Interventions: Cognitive Behavioral Therapy. Clinician conducted session via caregility video from clinician's home office. Patient provided verbal consent to proceed with telehealth session and is aware of limitations of telephone or video visits. Patient's wife was present in the home during today's session and patient provided verbal consent for patient's wife to be present during session. Patient participated in session from patient's home. Reviewed events since last session. Assessed patient's mood. Reviewed patient's progress towards goal to increase time outside/out in the community and the impact on patient's mood. Explored barriers to spending time out in the community.  Reviewed patient's thought record. Provided supportive therapy as patient discussed the loss of his brother and parents. Provided psycho education related to grief. Assisted patient in discussing and identifying thoughts/feelings triggered by limitations in patient's mobility. Clinician requested for homework patient continue thought record and gratitude journal.     Collaboration of Care: Other not required at this time  Diagnosis:  Major depressive disorder, recurrent episode, moderate (HCC)     Plan: Patient is to utilize [employer], thought re-framing, behavioral  activation, mindfulness and coping strategies to decrease symptoms associated with their diagnosis. Frequency: bi-weekly  Modality: individual      Long-term goal:   Reduce overall level, frequency, and intensity of the feelings of depression as evidenced by decrease in isolation, depressed mood,  lack of energy, fatigue, lack of motivation, loss of interest, difficulty concentrating, difficulty falling asleep at night, and sleeping 12-14 hours per day from 7 days/week to 0 to 1 days/week per patient's report for at least 3 consecutive months.  Target Date: 01/31/24  Progress: progressing      Short-term goal:  Implement mindfulness exercises to increase patient's focus on the present Target Date: 07/21/23  Progress: progressing    Increase patient's daily activities, such as, household chores, going to the grocery store, participating in activities outside of the house with patient's wife.   Target Date: 07/21/23  Progress: progressing    Verbalize patient's thoughts and feelings to wife through use of effective communication strategies per patient's report Target Date: 07/21/23  Progress: progressing              Doree Barthel, LCSW

## 2023-03-31 ENCOUNTER — Ambulatory Visit
Admission: EM | Admit: 2023-03-31 | Discharge: 2023-03-31 | Disposition: A | Discharge status: Home / Self Care | Payer: Medicare HMO | Attending: Emergency Medicine | Admitting: Emergency Medicine

## 2023-03-31 ENCOUNTER — Ambulatory Visit: Payer: Medicare HMO

## 2023-03-31 DIAGNOSIS — J22 Unspecified acute lower respiratory infection: Secondary | ICD-10-CM | POA: Diagnosis not present

## 2023-03-31 DIAGNOSIS — R030 Elevated blood-pressure reading, without diagnosis of hypertension: Secondary | ICD-10-CM

## 2023-03-31 DIAGNOSIS — R051 Acute cough: Secondary | ICD-10-CM | POA: Diagnosis not present

## 2023-03-31 DIAGNOSIS — R0602 Shortness of breath: Secondary | ICD-10-CM | POA: Diagnosis not present

## 2023-03-31 MED ORDER — DOXYCYCLINE HYCLATE 100 MG PO CAPS: 100.0000 mg | ORAL_CAPSULE | Freq: Two times a day (BID) | ORAL | 0 refills | Status: DC

## 2023-03-31 NOTE — ED Triage Notes (Signed)
Pt is with his wife.  Pt c/o cough, SOB, congestion x2weeks  Pt got a new cpap 2 weeks ago and states that symptoms started around then. Pt has worked with primary doctor to lower the pressure.  Pt states that his symptoms are worse when laying down.

## 2023-03-31 NOTE — ED Provider Notes (Signed)
[facility]    CSN: [id number] Arrival date & time: 03/31/23  1245      History   Chief Complaint Chief Complaint  Patient presents with   Cough   Shortness of Breath    HPI Nathan Dean is a 67 y.o. male.   67 year old male pt, presents to urgent care for evaluation of cough, sob x 2 weeks, no recent illness exposure. Pt recently had CPAP adjusted,reports worse at night and with exertion.   PMH: HTN, Diabetes, sleep apnea, CHF,obesity,metabolic encephalopathy,pulmonary edema  The history is provided by the patient and a relative. No language interpreter was used.    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    Diabetes (HCC)    GERD (gastroesophageal reflux disease)    Hyperlipidemia    Hypertension    Obesity    Sleep apnea     Patient Active Problem List   Diagnosis Date Noted   Shortness of breath 03/31/2023   Elevated blood pressure reading 03/31/2023   Compression fracture of L2 lumbar vertebra (HCC) 11/09/2022   Fall at home, initial encounter 11/09/2022   History of hydrocephalus 11/09/2022   (HFpEF) heart failure with preserved ejection fraction (HCC) 11/09/2022   Acute respiratory failure with hypoxia (HCC) 05/26/2022   Influenza A 05/26/2022   Obesity, Class III, BMI 40-49.9 (morbid obesity) (HCC) 05/26/2022   Localized pain of left shoulder joint 02/02/2022   Disorder of left suprascapular nerve 02/02/2022   Chronic right shoulder pain 07/30/2021   Cervical facet joint syndrome 07/30/2021   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/2021   Chronic pain syndrome 07/30/2021   History of bilateral knee replacement 07/30/2021   Bilateral chronic knee pain 07/30/2021   Post laminectomy syndrome 07/30/2021   Acute diastolic CHF (congestive heart failure) (HCC) 08/17/2019   Edema    Acute metabolic encephalopathy 08/12/2019   Acute pulmonary edema (HCC) 08/12/2019    Acute cor pulmonale (HCC) 08/11/2019   Oxygen desaturation 08/08/2019   S/P right rotator cuff repair 08/07/2019   Hypoxia 10/22/2018   Infection of right prepatellar bursa 10/18/2018   Sepsis (HCC) 10/18/2018   Chest congestion 07/20/2017   Viral illness 07/20/2017   Lower resp. tract infection 07/12/2017   Laryngitis, acute 07/12/2017   Cough 07/12/2017   Pleurisy 07/12/2017   Depression 06/03/2015   HTN (hypertension) 01/02/2015   Hyperlipidemia 01/02/2015   Uncontrolled type 2 diabetes mellitus with hyperglycemia, with long-term current use of insulin (HCC) 12/23/2014   Sleep apnea 09/21/2010   Arthritis, degenerative 02/17/2009   Hereditary and idiopathic peripheral neuropathy 11/21/2008   AD (atopic dermatitis) 07/08/2008   Acid reflux 04/05/2007    Past Surgical History:  Procedure Laterality Date   BACK SURGERY     in the 80's -herniated disc   BACK SURGERY     L4 bone spur 1990   CARPAL TUNNEL RELEASE Right 12/28/2016   Procedure: RIGHT ULNAR AND MEDIAN NEUROPLASTY AT WRIST;  Surgeon: Mack Hook, MD;  Location: [facility];  Service: Orthopedics;  Laterality: Right;   CARPAL TUNNEL RELEASE Left    25 years ago   CATARACT EXTRACTION W/ INTRAOCULAR LENS IMPLANT Bilateral    REPLACEMENT TOTAL KNEE BILATERAL  2014   left 2012 rt 2014   SHOULDER OPEN ROTATOR CUFF REPAIR Right 08/07/2019   Procedure: ROTATOR CUFF REPAIR SHOULDER OPEN;  Surgeon: Juanell Fairly, MD;  Location: [facility];  Service: Orthopedics;  Laterality: Right;   SKIN GRAFT Left 06/19/2022   left side of neck for skin cancer on top of head   TONSILLECTOMY         Home Medications    Prior to Admission medications   Medication Sig Start Date End Date Taking? Authorizing Provider  albuterol (VENTOLIN HFA) 108 (90 Base) MCG/ACT inhaler Inhale 2 puffs into the lungs every 4 (four) hours as needed. 05/27/22 05/27/23 Yes [provider]  aspirin EC 81 MG tablet Take 81 mg by  mouth daily. 06/04/22  Yes [provider]  buPROPion (WELLBUTRIN XL) 300 MG 24 hr tablet Take 450 mg by mouth daily. 06/05/21  Yes [provider]  carvedilol (COREG) 12.5 MG tablet Take 1 tablet (12.5 mg total) by mouth 2 (two) times daily with a meal. 08/17/19  Yes Lurene Shadow, MD  cholecalciferol (VITAMIN D3) 25 MCG (1000 UNIT) tablet Take 2,000 Units by mouth daily.   Yes [provider]  cyanocobalamin (VITAMIN B12) 1000 MCG tablet Take 5,000 mcg by mouth daily.   Yes [provider]  doxycycline (VIBRAMYCIN) 100 MG capsule Take 1 capsule (100 mg total) by mouth 2 (two) times daily. 03/31/23  Yes Avo Schlachter, Para March, NP  gabapentin (NEURONTIN) 300 MG capsule Take 3 capsules (900 mg total) by mouth 3 (three) times daily. 10/21/18  Yes Wieting, Richard, MD  HYDROcodone-acetaminophen (NORCO/VICODIN) 5-325 MG tablet Take 1 tablet by mouth every 6 (six) hours as needed for moderate pain. 11/11/22  Yes Enedina Finner, MD  insulin glargine (LANTUS) 100 UNIT/ML injection Inject 30 Units into the skin daily.   Yes [provider]  insulin lispro (HUMALOG) 100 UNIT/ML injection Inject 18 Units into the skin in the morning and at bedtime. 18 in am. 8 in pm   Yes [provider]  ketoconazole (NIZORAL) 2 % cream Apply 1 Application topically 2 (two) times daily. 09/09/22 09/09/23 Yes [provider]  loratadine (CLARITIN) 10 MG tablet Take 10 mg by mouth daily as needed. 06/01/21  Yes [provider]  losartan (COZAAR) 100 MG tablet Take 1 tablet (100 mg total) by mouth daily. 05/01/19  Yes Sagardia, Eilleen Kempf, MD  magnesium oxide (MAG-OX) 400 (240 Mg) MG tablet Take 500 mg by mouth daily.   Yes [provider]  metFORMIN (GLUCOPHAGE) 500 MG tablet Take 1,000 mg by mouth 2 (two) times daily with a meal.   Yes [provider]  methocarbamol (ROBAXIN) 750 MG tablet Take 750 mg by mouth 4 (four) times daily.   Yes [provider]  mometasone (ELOCON) 0.1 % cream Apply 1 Application topically daily. 08/13/22  Yes [provider]  ondansetron (ZOFRAN-ODT) 4 MG disintegrating tablet Take 8 mg by mouth every 8 (eight) hours as needed for nausea. 07/05/22  Yes [provider]  rosuvastatin (CRESTOR) 20 MG tablet Take 20 mg by mouth daily.   Yes [provider]  senna-docusate (SENOKOT-S) 8.6-50 MG tablet Take 2 tablets by mouth daily. 06/01/21  Yes [provider]  sertraline (ZOLOFT) 50 MG tablet 25 mg at night for one week, then 50 mg at night 02/17/23  Yes Hisada, Reina, MD  tirzepatide [facility]) 2.5 MG/0.5ML Pen Inject 2.5 mg into the skin once a week.   Yes [provider]  traZODone (DESYREL) 50 MG tablet Take 1 tablet by mouth at bedtime. 08/13/22 08/13/23 Yes [provider]  donepezil (ARICEPT) 10 MG tablet Take 10 mg by mouth at bedtime. 08/18/22 11/16/22  [provider]  omeprazole (PRILOSEC) 20 MG capsule Take 40 mg by mouth 2 times daily at 12 noon and 4 pm.    [provider]    Family History Family History  Problem Relation Age of Onset   Ovarian cancer Mother    Kidney failure Father    Cancer Brother     Social History Social History   Tobacco Use   Smoking status: Former    Current packs/day: 0.00    Average packs/day: 2.0 packs/day for 20.0 years (40.0 ttl pk-yrs)    Types: Cigarettes    Start date: 06/28/1976    Quit date: 06/28/1996    Years since quitting: 26.7   Smokeless tobacco: Never  Vaping Use   Vaping status: Never Used  Substance Use Topics   Alcohol use: Yes    Comment: occasionally   Drug use: No     Allergies   Penicillins, Succinylcholine chloride, Semaglutide, Keflex [cephalexin], and Sulfa antibiotics   Review of Systems Review of Systems  Constitutional:  Negative for fever.  Respiratory:  Positive for cough and shortness of breath.   All other systems reviewed and are  negative.    Physical Exam Triage Vital Signs ED Triage Vitals  Encounter Vitals Group     BP 03/31/23 1300 (!) 174/78     Systolic BP Percentile --      Diastolic BP Percentile --      Pulse Rate 03/31/23 1300 69     Resp --      Temp 03/31/23 1300 98.3 F (36.8 C)     Temp Source 03/31/23 1300 Oral     SpO2 03/31/23 1300 93 %     Weight 03/31/23 1258 (!) 340 lb (154.2 kg)     Height 03/31/23 1258 6\' 9"  (2.057 m)     Head Circumference --      Peak Flow --      Pain Score 03/31/23 1257 4     Pain Loc --      Pain Education --      Exclude from Growth Chart --    No data found.  Updated Vital Signs BP (!) 174/78 (BP Location: Left Arm)   Pulse 69   Temp 98.3 F (36.8 C) (Oral)   Ht 6\' 9"  (2.057 m)   Wt (!) 340 lb (154.2 kg)   SpO2 93%   BMI 36.43 kg/m   Visual Acuity Right Eye Distance:   Left Eye Distance:   Bilateral Distance:    Right Eye Near:   Left Eye Near:    Bilateral Near:     Physical Exam Vitals and nursing note reviewed.  Constitutional:      Appearance: He is obese.  Cardiovascular:     Rate and Rhythm: Normal rate.     Heart sounds: Murmur heard.  Pulmonary:     Effort: Pulmonary effort is normal.  Neurological:     General: No focal deficit present.     Mental Status: He is alert and oriented to person, place, and time.     GCS: GCS eye subscore is 4. GCS verbal subscore is 5. GCS motor subscore is 6.     Cranial Nerves: No cranial nerve deficit.     Sensory: No sensory deficit.      UC Treatments / Results  Labs (all labs ordered are listed, but only abnormal results are displayed) Labs Reviewed - No data to display  EKG   Radiology DG Chest 2 View  Result Date: 03/31/2023 CLINICAL DATA:  Cough, shortness of breath and congestion for 2 weeks. Symptoms began after getting a new CPAP machine. EXAM: CHEST - 2 VIEW COMPARISON:  One-view chest x-ray 11/09/2022.  CT chest 11/09/2022. FINDINGS: The heart size is normal. Diffuse  interstitial coarsening is increased slightly. Areas of scarring previously seen are again noted. No new focal airspace disease is present. The shunt tubing extends over the chest. Small bilateral effusions are present. IMPRESSION: 1. Slight increase in diffuse interstitial coarsening and small bilateral effusions. This may reflect pulmonary edema. 2. No new focal airspace disease. Electronically Signed   By: Marin Roberts M.D.   On: 03/31/2023 16:49    Procedures Procedures (including critical care time)  Medications Ordered in UC Medications - No data to display  Initial Impression / Assessment and Plan / UC Course  I have reviewed the triage vital signs and the nursing notes.  Pertinent labs & imaging results that were available during my care of the patient were reviewed by me and considered in my medical decision making (see chart for details).  Clinical Course as of 03/31/23 1818  Thu Mar 31, 2023  1320 Pt to xray for CXR [JD]  1340 Will treat for possible pneumonia, follow up with PCP/ER for worsening symptoms(chest pain,palpitations, worsening shortness of breath, etc).  [JD]    Clinical Course User Index [JD] Carmelita Amparo, Para March, NP   Discussed exam findings and plan of care with patient, strict go to ER precautions given.   Patient and wife verbalized understanding to this provider.  Ddx: Lower respiratory tract infection, shortness of breath, acute cough, pulmonary edema, elevated blood pressure reading Final Clinical Impressions(s) / UC Diagnoses   Final diagnoses:  Acute cough  Shortness of breath  Lower resp. tract infection  Elevated blood pressure reading     Discharge Instructions      Treating for pneumonia, take antibiotic as directed. Drink plenty of water.   If you develop chest pain,worsening shortness of breath, palpitations, leg swelling,etc go immediately to ER for further evaluation.      ED Prescriptions     Medication Sig Dispense Auth.  Provider   doxycycline (VIBRAMYCIN) 100 MG capsule Take 1 capsule (100 mg total) by mouth 2 (two) times daily. 20 capsule Zebulen Simonis, Para March, NP      PDMP not reviewed this encounter.   Clancy Gourd, NP 03/31/23 1818

## 2023-03-31 NOTE — Discharge Instructions (Addendum)
Treating for pneumonia, take antibiotic as directed. Drink plenty of water.   If you develop chest pain,worsening shortness of breath, palpitations, leg swelling,etc go immediately to ER for further evaluation.

## 2023-04-01 ENCOUNTER — Emergency Department: Payer: Medicare HMO

## 2023-04-01 ENCOUNTER — Ambulatory Visit: Payer: Self-pay

## 2023-04-01 ENCOUNTER — Ambulatory Visit: Payer: Medicare HMO | Admitting: Clinical

## 2023-04-01 ENCOUNTER — Inpatient Hospital Stay
Admission: EM | Admit: 2023-04-01 | Discharge: 2023-04-05 | DRG: 291 | Disposition: A | Discharge status: Home Health | Payer: Medicare HMO | Attending: Internal Medicine | Admitting: Internal Medicine

## 2023-04-01 ENCOUNTER — Other Ambulatory Visit: Payer: Self-pay

## 2023-04-01 ENCOUNTER — Inpatient Hospital Stay: Payer: Medicare HMO

## 2023-04-01 DIAGNOSIS — Z96653 Presence of artificial knee joint, bilateral: Secondary | ICD-10-CM | POA: Diagnosis present

## 2023-04-01 DIAGNOSIS — Z794 Long term (current) use of insulin: Secondary | ICD-10-CM | POA: Diagnosis not present

## 2023-04-01 DIAGNOSIS — Z961 Presence of intraocular lens: Secondary | ICD-10-CM | POA: Diagnosis present

## 2023-04-01 DIAGNOSIS — I5033 Acute on chronic diastolic (congestive) heart failure: Secondary | ICD-10-CM | POA: Diagnosis present

## 2023-04-01 DIAGNOSIS — Z87891 Personal history of nicotine dependence: Secondary | ICD-10-CM | POA: Diagnosis not present

## 2023-04-01 DIAGNOSIS — I10 Essential (primary) hypertension: Secondary | ICD-10-CM | POA: Diagnosis present

## 2023-04-01 DIAGNOSIS — Z8669 Personal history of other diseases of the nervous system and sense organs: Secondary | ICD-10-CM

## 2023-04-01 DIAGNOSIS — Z841 Family history of disorders of kidney and ureter: Secondary | ICD-10-CM

## 2023-04-01 DIAGNOSIS — Z79899 Other long term (current) drug therapy: Secondary | ICD-10-CM

## 2023-04-01 DIAGNOSIS — E1142 Type 2 diabetes mellitus with diabetic polyneuropathy: Secondary | ICD-10-CM | POA: Diagnosis present

## 2023-04-01 DIAGNOSIS — Z7984 Long term (current) use of oral hypoglycemic drugs: Secondary | ICD-10-CM | POA: Diagnosis not present

## 2023-04-01 DIAGNOSIS — Z809 Family history of malignant neoplasm, unspecified: Secondary | ICD-10-CM

## 2023-04-01 DIAGNOSIS — F418 Other specified anxiety disorders: Secondary | ICD-10-CM | POA: Diagnosis present

## 2023-04-01 DIAGNOSIS — Z7982 Long term (current) use of aspirin: Secondary | ICD-10-CM

## 2023-04-01 DIAGNOSIS — Z881 Allergy status to other antibiotic agents status: Secondary | ICD-10-CM

## 2023-04-01 DIAGNOSIS — I509 Heart failure, unspecified: Secondary | ICD-10-CM | POA: Diagnosis present

## 2023-04-01 DIAGNOSIS — Z85828 Personal history of other malignant neoplasm of skin: Secondary | ICD-10-CM | POA: Diagnosis not present

## 2023-04-01 DIAGNOSIS — E1165 Type 2 diabetes mellitus with hyperglycemia: Secondary | ICD-10-CM | POA: Diagnosis present

## 2023-04-01 DIAGNOSIS — K219 Gastro-esophageal reflux disease without esophagitis: Secondary | ICD-10-CM | POA: Diagnosis present

## 2023-04-01 DIAGNOSIS — D509 Iron deficiency anemia, unspecified: Secondary | ICD-10-CM | POA: Diagnosis present

## 2023-04-01 DIAGNOSIS — I5030 Unspecified diastolic (congestive) heart failure: Secondary | ICD-10-CM | POA: Diagnosis present

## 2023-04-01 DIAGNOSIS — Z8041 Family history of malignant neoplasm of ovary: Secondary | ICD-10-CM

## 2023-04-01 DIAGNOSIS — J9601 Acute respiratory failure with hypoxia: Secondary | ICD-10-CM | POA: Diagnosis not present

## 2023-04-01 DIAGNOSIS — E876 Hypokalemia: Secondary | ICD-10-CM | POA: Diagnosis present

## 2023-04-01 DIAGNOSIS — T502X5A Adverse effect of carbonic-anhydrase inhibitors, benzothiadiazides and other diuretics, initial encounter: Secondary | ICD-10-CM | POA: Diagnosis present

## 2023-04-01 DIAGNOSIS — Z880 Allergy status to penicillin: Secondary | ICD-10-CM

## 2023-04-01 DIAGNOSIS — Z888 Allergy status to other drugs, medicaments and biological substances status: Secondary | ICD-10-CM | POA: Diagnosis not present

## 2023-04-01 DIAGNOSIS — G4733 Obstructive sleep apnea (adult) (pediatric): Secondary | ICD-10-CM | POA: Diagnosis present

## 2023-04-01 DIAGNOSIS — J9621 Acute and chronic respiratory failure with hypoxia: Secondary | ICD-10-CM | POA: Diagnosis present

## 2023-04-01 DIAGNOSIS — I110 Hypertensive heart disease with heart failure: Secondary | ICD-10-CM | POA: Diagnosis present

## 2023-04-01 DIAGNOSIS — Z6835 Body mass index (BMI) 35.0-35.9, adult: Secondary | ICD-10-CM

## 2023-04-01 DIAGNOSIS — Z7985 Long-term (current) use of injectable non-insulin antidiabetic drugs: Secondary | ICD-10-CM

## 2023-04-01 DIAGNOSIS — Z9841 Cataract extraction status, right eye: Secondary | ICD-10-CM

## 2023-04-01 DIAGNOSIS — Z87892 Personal history of anaphylaxis: Secondary | ICD-10-CM

## 2023-04-01 DIAGNOSIS — Z982 Presence of cerebrospinal fluid drainage device: Secondary | ICD-10-CM

## 2023-04-01 DIAGNOSIS — Z9842 Cataract extraction status, left eye: Secondary | ICD-10-CM

## 2023-04-01 LAB — CBC
HCT: 33.6 % — ABNORMAL LOW (ref 39.0–52.0)
Hemoglobin: 9.8 g/dL — ABNORMAL LOW (ref 13.0–17.0)
MCH: 26.5 pg (ref 26.0–34.0)
MCHC: 29.2 g/dL — ABNORMAL LOW (ref 30.0–36.0)
MCV: 90.8 fL (ref 80.0–100.0)
Platelets: 186 10*3/uL (ref 150–400)
RBC: 3.7 MIL/uL — ABNORMAL LOW (ref 4.22–5.81)
RDW: 15.5 % (ref 11.5–15.5)
WBC: 5.6 10*3/uL (ref 4.0–10.5)
nRBC: 0 % (ref 0.0–0.2)

## 2023-04-01 LAB — IRON AND TIBC
Iron: 36 ug/dL — ABNORMAL LOW (ref 45–182)
Saturation Ratios: 9 % — ABNORMAL LOW (ref 17.9–39.5)
TIBC: 389 ug/dL (ref 250–450)
UIBC: 353 ug/dL

## 2023-04-01 LAB — RETICULOCYTES
Immature Retic Fract: 35.7 % — ABNORMAL HIGH (ref 2.3–15.9)
RBC.: 3.91 MIL/uL — ABNORMAL LOW (ref 4.22–5.81)
Retic Count, Absolute: 111.4 10*3/uL (ref 19.0–186.0)
Retic Ct Pct: 2.9 % (ref 0.4–3.1)

## 2023-04-01 LAB — GLUCOSE, CAPILLARY
Glucose-Capillary: 124 mg/dL — ABNORMAL HIGH (ref 70–99)
Glucose-Capillary: 134 mg/dL — ABNORMAL HIGH (ref 70–99)

## 2023-04-01 LAB — COMPREHENSIVE METABOLIC PANEL
ALT: 13 U/L (ref 0–44)
AST: 27 U/L (ref 15–41)
Albumin: 3.6 g/dL (ref 3.5–5.0)
Alkaline Phosphatase: 55 U/L (ref 38–126)
Anion gap: 9 (ref 5–15)
BUN: 17 mg/dL (ref 8–23)
CO2: 25 mmol/L (ref 22–32)
Calcium: 8.9 mg/dL (ref 8.9–10.3)
Chloride: 105 mmol/L (ref 98–111)
Creatinine, Ser: 0.77 mg/dL (ref 0.61–1.24)
GFR, Estimated: >60 mL/min (ref >60)
Glucose, Bld: 110 mg/dL — ABNORMAL HIGH (ref 70–99)
Potassium: 4 mmol/L (ref 3.5–5.1)
Sodium: 139 mmol/L (ref 135–145)
Total Bilirubin: 0.4 mg/dL (ref <1.2)
Total Protein: 6.3 g/dL — ABNORMAL LOW (ref 6.5–8.1)

## 2023-04-01 LAB — CBG MONITORING, ED
Glucose-Capillary: 98 mg/dL (ref 70–99)
Glucose-Capillary: 98 mg/dL (ref 70–99)

## 2023-04-01 LAB — TSH: TSH: 0.817 u[IU]/mL (ref 0.350–4.500)

## 2023-04-01 LAB — TROPONIN I (HIGH SENSITIVITY): Troponin I (High Sensitivity): 13 ng/L (ref <18)

## 2023-04-01 LAB — BRAIN NATRIURETIC PEPTIDE: B Natriuretic Peptide: 252.6 pg/mL — ABNORMAL HIGH (ref 0.0–100.0)

## 2023-04-01 LAB — FERRITIN: Ferritin: 18 ng/mL — ABNORMAL LOW (ref 24–336)

## 2023-04-01 MED ORDER — MAGNESIUM OXIDE -MG SUPPLEMENT 400 (240 MG) MG PO TABS
400.0000 mg | ORAL_TABLET | Freq: Every day | ORAL | Status: DC
  Administered 2023-04-01 – 2023-04-02 (×2): 400 mg via ORAL
  Filled 2023-04-01 (×2): qty 1

## 2023-04-01 MED ORDER — METFORMIN HCL 500 MG PO TABS
1000.0000 mg | ORAL_TABLET | Freq: Two times a day (BID) | ORAL | Status: DC
  Administered 2023-04-01 – 2023-04-05 (×8): 1000 mg via ORAL
  Filled 2023-04-01 (×8): qty 2

## 2023-04-01 MED ORDER — BUPROPION HCL ER (XL) 150 MG PO TB24
450.0000 mg | ORAL_TABLET | Freq: Every day | ORAL | Status: DC
  Administered 2023-04-02 – 2023-04-05 (×4): 450 mg via ORAL
  Filled 2023-04-01 (×4): qty 3

## 2023-04-01 MED ORDER — SERTRALINE HCL 50 MG PO TABS
50.0000 mg | ORAL_TABLET | Freq: Every day | ORAL | Status: DC
Start: 2023-04-01 — End: 2023-04-05
  Administered 2023-04-01 – 2023-04-04 (×4): 50 mg via ORAL
  Filled 2023-04-01 (×4): qty 1

## 2023-04-01 MED ORDER — SODIUM CHLORIDE 0.9 % IV SOLN
250.0000 mL | INTRAVENOUS | Status: AC | PRN
Start: 2023-04-01 — End: 2023-04-02

## 2023-04-01 MED ORDER — SENNOSIDES-DOCUSATE SODIUM 8.6-50 MG PO TABS
2.0000 | ORAL_TABLET | Freq: Every day | ORAL | Status: DC
  Administered 2023-04-02 – 2023-04-05 (×4): 2 via ORAL
  Filled 2023-04-01 (×4): qty 2

## 2023-04-01 MED ORDER — FUROSEMIDE 10 MG/ML IJ SOLN
60.0000 mg | Freq: Two times a day (BID) | INTRAMUSCULAR | Status: DC
  Administered 2023-04-01 – 2023-04-04 (×7): 60 mg via INTRAVENOUS
  Filled 2023-04-01 (×8): qty 6

## 2023-04-01 MED ORDER — ASPIRIN 81 MG PO TBEC
81.0000 mg | DELAYED_RELEASE_TABLET | Freq: Every day | ORAL | Status: DC
  Administered 2023-04-02 – 2023-04-05 (×4): 81 mg via ORAL
  Filled 2023-04-01 (×4): qty 1

## 2023-04-01 MED ORDER — MAGNESIUM OXIDE -MG SUPPLEMENT 400 (240 MG) MG PO TABS: 500.0000 mg | ORAL_TABLET | Freq: Every day | ORAL | Status: DC

## 2023-04-01 MED ORDER — ENOXAPARIN SODIUM 40 MG/0.4ML IJ SOSY: 40.0000 mg | PREFILLED_SYRINGE | INTRAMUSCULAR | Status: DC

## 2023-04-01 MED ORDER — INSULIN ASPART 100 UNIT/ML IJ SOLN
0.0000 [IU] | Freq: Three times a day (TID) | INTRAMUSCULAR | Status: DC
  Administered 2023-04-02 (×2): 3 [IU] via SUBCUTANEOUS
  Administered 2023-04-03 (×2): 4 [IU] via SUBCUTANEOUS
  Administered 2023-04-04: 3 [IU] via SUBCUTANEOUS
  Administered 2023-04-04 – 2023-04-05 (×3): 4 [IU] via SUBCUTANEOUS
  Filled 2023-04-01 (×7): qty 1

## 2023-04-01 MED ORDER — DOXYCYCLINE HYCLATE 100 MG PO TABS
100.0000 mg | ORAL_TABLET | Freq: Two times a day (BID) | ORAL | Status: DC
  Administered 2023-04-01 – 2023-04-05 (×8): 100 mg via ORAL
  Filled 2023-04-01 (×8): qty 1

## 2023-04-01 MED ORDER — ACETAMINOPHEN 325 MG PO TABS
650.0000 mg | ORAL_TABLET | ORAL | Status: DC | PRN
  Administered 2023-04-01: 650 mg via ORAL
  Filled 2023-04-01: qty 2

## 2023-04-01 MED ORDER — LOSARTAN POTASSIUM 25 MG PO TABS
25.0000 mg | ORAL_TABLET | Freq: Every day | ORAL | Status: DC
  Administered 2023-04-02 – 2023-04-05 (×4): 25 mg via ORAL
  Filled 2023-04-01 (×4): qty 1

## 2023-04-01 MED ORDER — ALBUTEROL SULFATE (2.5 MG/3ML) 0.083% IN NEBU: 2.5000 mg | INHALATION_SOLUTION | RESPIRATORY_TRACT | Status: DC | PRN

## 2023-04-01 MED ORDER — METHOCARBAMOL 500 MG PO TABS
750.0000 mg | ORAL_TABLET | Freq: Four times a day (QID) | ORAL | Status: DC | PRN
  Administered 2023-04-01 – 2023-04-04 (×6): 750 mg via ORAL
  Filled 2023-04-01 (×6): qty 2

## 2023-04-01 MED ORDER — FUROSEMIDE 10 MG/ML IJ SOLN
40.0000 mg | Freq: Once | INTRAMUSCULAR | Status: AC
  Administered 2023-04-01: 40 mg via INTRAVENOUS
  Filled 2023-04-01: qty 4

## 2023-04-01 MED ORDER — IRON SUCROSE 500 MG IVPB - SIMPLE MED
500.0000 mg | Freq: Once | INTRAVENOUS | Status: DC
  Filled 2023-04-01: qty 275

## 2023-04-01 MED ORDER — PANTOPRAZOLE SODIUM 40 MG PO TBEC
40.0000 mg | DELAYED_RELEASE_TABLET | Freq: Two times a day (BID) | ORAL | Status: DC
  Administered 2023-04-01 – 2023-04-05 (×8): 40 mg via ORAL
  Filled 2023-04-01 (×8): qty 1

## 2023-04-01 MED ORDER — FERROUS SULFATE 325 (65 FE) MG PO TABS
325.0000 mg | ORAL_TABLET | Freq: Every day | ORAL | Status: DC
  Administered 2023-04-02 – 2023-04-05 (×4): 325 mg via ORAL
  Filled 2023-04-01 (×4): qty 1

## 2023-04-01 MED ORDER — ROSUVASTATIN CALCIUM 10 MG PO TABS
20.0000 mg | ORAL_TABLET | Freq: Every day | ORAL | Status: DC
  Administered 2023-04-02 – 2023-04-05 (×4): 20 mg via ORAL
  Filled 2023-04-01 (×4): qty 2

## 2023-04-01 MED ORDER — SODIUM CHLORIDE 0.9% FLUSH
3.0000 mL | Freq: Two times a day (BID) | INTRAVENOUS | Status: DC
  Administered 2023-04-01 – 2023-04-04 (×8): 3 mL via INTRAVENOUS

## 2023-04-01 MED ORDER — IRON SUCROSE 300 MG IVPB - SIMPLE MED
300.0000 mg | Freq: Once | Status: DC
  Filled 2023-04-01: qty 265

## 2023-04-01 MED ORDER — ONDANSETRON 8 MG PO TBDP: 8.0000 mg | ORAL_TABLET | Freq: Three times a day (TID) | ORAL | Status: DC | PRN

## 2023-04-01 MED ORDER — SODIUM CHLORIDE 0.9% FLUSH: 3.0000 mL | INTRAVENOUS | Status: DC | PRN

## 2023-04-01 MED ORDER — ONDANSETRON HCL 4 MG/2ML IJ SOLN
4.0000 mg | Freq: Four times a day (QID) | INTRAMUSCULAR | Status: DC | PRN
  Administered 2023-04-04: 4 mg via INTRAVENOUS
  Filled 2023-04-01: qty 2

## 2023-04-01 MED ORDER — CARVEDILOL 12.5 MG PO TABS
12.5000 mg | ORAL_TABLET | Freq: Two times a day (BID) | ORAL | Status: DC
  Administered 2023-04-01 – 2023-04-05 (×8): 12.5 mg via ORAL
  Filled 2023-04-01: qty 2
  Filled 2023-04-01 (×7): qty 1

## 2023-04-01 MED ORDER — HYDROCODONE-ACETAMINOPHEN 5-325 MG PO TABS
1.0000 | ORAL_TABLET | Freq: Four times a day (QID) | ORAL | Status: DC | PRN
  Administered 2023-04-01 – 2023-04-02 (×2): 1 via ORAL
  Filled 2023-04-01 (×2): qty 1

## 2023-04-01 MED ORDER — GABAPENTIN 300 MG PO CAPS
900.0000 mg | ORAL_CAPSULE | Freq: Two times a day (BID) | ORAL | Status: DC
  Administered 2023-04-01 – 2023-04-05 (×8): 900 mg via ORAL
  Filled 2023-04-01 (×8): qty 3

## 2023-04-01 MED ORDER — TRAZODONE HCL 50 MG PO TABS
50.0000 mg | ORAL_TABLET | Freq: Every day | ORAL | Status: DC
  Administered 2023-04-01 – 2023-04-04 (×4): 50 mg via ORAL
  Filled 2023-04-01 (×4): qty 1

## 2023-04-01 MED ORDER — DONEPEZIL HCL 5 MG PO TABS
10.0000 mg | ORAL_TABLET | Freq: Every day | ORAL | Status: DC
  Administered 2023-04-02 – 2023-04-04 (×3): 10 mg via ORAL
  Filled 2023-04-01 (×3): qty 2

## 2023-04-01 MED ORDER — INSULIN ASPART 100 UNIT/ML IJ SOLN
0.0000 [IU] | Freq: Every day | INTRAMUSCULAR | Status: DC
  Filled 2023-04-01 (×2): qty 1

## 2023-04-01 MED ORDER — ENOXAPARIN SODIUM 80 MG/0.8ML IJ SOSY
0.5000 mg/kg | PREFILLED_SYRINGE | INTRAMUSCULAR | Status: DC
  Administered 2023-04-01 – 2023-04-04 (×4): 77.5 mg via SUBCUTANEOUS
  Filled 2023-04-01: qty 0.78
  Filled 2023-04-01 (×4): qty 0.8

## 2023-04-01 NOTE — ED Notes (Signed)
ED Provider at bedside. 

## 2023-04-01 NOTE — Progress Notes (Signed)
PHARMACIST - PHYSICIAN COMMUNICATION  CONCERNING:  Enoxaparin (Lovenox) for DVT Prophylaxis    RECOMMENDATION: Patient was prescribed enoxaprin 40mg  q24 hours for VTE prophylaxis.   Filed Weights   04/01/23 1006  Weight: (!) 154.2 kg (340 lb)    Body mass index is 36.43 kg/m.  Estimated Creatinine Clearance: 150.9 mL/min (by C-G formula based on SCr of 0.77 mg/dL).   Based on [facility] policy patient is candidate for enoxaparin 0.5mg /kg TBW SQ every 24 hours based on BMI being >30.  DESCRIPTION: Pharmacy has adjusted enoxaparin dose per [facility] policy.  Patient is now receiving enoxaparin 77.5 mg every 24 hours    Foye Deer, PharmD Clinical Pharmacist  04/01/2023 11:49 AM

## 2023-04-01 NOTE — ED Provider Notes (Signed)
[facility] Provider Note    Event Date/Time   First MD Initiated Contact with Patient 04/01/23 1038     (approximate)   History   Shortness of Breath   HPI  Nathan Dean is a 67 y.o. male with a history of diabetes hypertension, hyperlipidemia, sleep apnea who presents with gradually worsening shortness of breath over the last 2 weeks.  They had thought this may have been related to improper CPAP settings, went to urgent care had x-ray performed yesterday, was started on doxycycline for possible upper respiratory infection.  Wife reports the patient was significantly hypoxic this morning     Physical Exam   Triage Vital Signs: ED Triage Vitals  Encounter Vitals Group     BP 04/01/23 1006 (!) 144/64     Systolic BP Percentile --      Diastolic BP Percentile --      Pulse Rate 04/01/23 1006 69     Resp 04/01/23 1006 (!) 22     Temp 04/01/23 1006 98.6 F (37 C)     Temp Source 04/01/23 1006 Oral     SpO2 04/01/23 1006 94 %     Weight 04/01/23 1006 (!) 154.2 kg (340 lb)     Height 04/01/23 1006 2.057 m (6\' 9" )     Head Circumference --      Peak Flow --      Pain Score 04/01/23 1014 4     Pain Loc --      Pain Education --      Exclude from Growth Chart --     Most recent vital signs: Vitals:   04/01/23 1006 04/01/23 1045  BP: (!) 144/64 (!) 158/77  Pulse: 69 70  Resp: (!) 22   Temp: 98.6 F (37 C)   SpO2: 94% 90%     General: Awake, no distress.  CV:  Good peripheral perfusion.  Resp:  Normal effort.  Abd:  No distention.  Other:  Significant lower extremity edema, report is chronic   ED Results / Procedures / Treatments   Labs (all labs ordered are listed, but only abnormal results are displayed) Labs Reviewed  CBC - Abnormal; Notable for the following components:      Result Value   RBC 3.70 (*)    Hemoglobin 9.8 (*)    HCT 33.6 (*)    MCHC 29.2 (*)    All other components within normal limits  COMPREHENSIVE  METABOLIC PANEL - Abnormal; Notable for the following components:   Glucose, Bld 110 (*)    Total Protein 6.3 (*)    All other components within normal limits  BRAIN NATRIURETIC PEPTIDE  TROPONIN I (HIGH SENSITIVITY)     EKG ED ECG REPORT I, Jene Every, the attending physician, personally viewed and interpreted this ECG.  Date: 04/01/2023  Rhythm: normal sinus rhythm QRS Axis: normal Intervals: Prolonged QT ST/T Wave abnormalities: normal Narrative Interpretation: no evidence of acute ischemia   RADIOLOGY Chest x-ray with likely mild edema    PROCEDURES:  Critical Care performed: yes  CRITICAL CARE Performed by: Jene Every   Total critical care time: 30 minutes  Critical care time was exclusive of separately billable procedures and treating other patients.  Critical care was necessary to treat or prevent imminent or life-threatening deterioration.  Critical care was time spent personally by me on the following activities: development of treatment plan with patient and/or surrogate as well as nursing, discussions with consultants, evaluation of patient's response  to treatment, examination of patient, obtaining history from patient or surrogate, ordering and performing treatments and interventions, ordering and review of laboratory studies, ordering and review of radiographic studies, pulse oximetry and re-evaluation of patient's condition.   Procedures   MEDICATIONS ORDERED IN ED: Medications  furosemide (LASIX) injection 40 mg (40 mg Intravenous Given 04/01/23 1117)     IMPRESSION / MDM / ASSESSMENT AND PLAN / ED COURSE  I reviewed the triage vital signs and the nursing notes. Patient's presentation is most consistent with acute presentation with potential threat to life or bodily function.  Patient presents with shortness of breath, hypoxia, he is not on home oxygen only CPAP at night  Found to be hypoxic in the 80s here, nasal cannula oxygen started  to keep saturations above 90%  Lab work overall reassuring, pending BNP and troponin  X-ray performed yesterday reviewed by me, consistent with likely pulmonary edema this fits with clinical picture  Will start IV Lasix, admit to the hospitalist service for further evaluation and diuresis        FINAL CLINICAL IMPRESSION(S) / ED DIAGNOSES   Final diagnoses:  Acute respiratory failure with hypoxia (HCC)  Acute congestive heart failure, unspecified heart failure type (HCC)     Rx / DC Orders   ED Discharge Orders     None        Note:  This document was prepared using Dragon voice recognition software and may include unintentional dictation errors.   Jene Every, MD 04/01/23 [phone]

## 2023-04-01 NOTE — ED Triage Notes (Signed)
Pt to ED for shob. Reports new CPAP and settings were to high, advised to get cxray. Started antibiotics last night. Here for hypoxia. Pt skin color pale. +generalized weakness.   85-90% RA. Placed on 2 L [place] in triage

## 2023-04-01 NOTE — Progress Notes (Signed)
Iron sat low. One dose of Venofer ordered and start po Iron in AM.

## 2023-04-01 NOTE — H&P (Signed)
History and Physical    Nathan Dean AOZ:[id number] DOB: [birth year] DOA: 04/01/2023  PCP: Lorrine Kin, MD (Confirm with patient/family/NH records and if not entered, this has to be entered at [facility] point of entry) Patient coming from: Home  I have personally briefly reviewed patient's old medical records in [facility]  Chief Complaint: SOB  HPI: Nathan Dean is a 67 y.o. male with medical history significant of chronic HFpEF, HTN, IDDM, diabetic peripheral neuropathy, chronic pain syndromes, severe GERD, OSA on CPAP at bedtime anxiety/depression, morbid obesity, brought in by family member for hypoxia.  Family reported that patient was recently diagnosed with OSA and started on CPAP and recently for the last 2 weeks, he started on the new CPAP machine and since then he has had trouble " to seal with mask" as recorded on the phone app monitoring and patient started to experience trouble breathing at night and wakes up occasionally feeling shortness of breath.  He also has been having a chronic dry cough which turned worse recently and went to see his doctor yesterday diagnosed with bronchitis and started on doxycycline.  Denied any fever or chills.  Wife reported that occasionally nocturnal pulse ox showed O2 saturation mid 80s.  This morning, pulse ox was in the 70s and wife decided to bring him to the ER.    Patient also has had poorly controlled GERD, and PPI was doubled recently and he has had some improvement of heartburn symptoms.  He also reported a dry cough chronic which has not changed.  Weight wise, patient weight has been starting since summer, first he lost about 30 pounds during summer which family attributed to his severe GERD and frequent nauseous vomiting for which he is referred to see [facility] January next year.  Then recently, he started to gain weight and compared to summer he did gain back about 20 pounds, which family reports ability to better controlled GERD  and change of diet.  And family reported that the patient has had chronic leg swelling which has now significant changes.  Patient has a sedentary lifestyle and denied any experience of exertional dyspnea.  No chest pains.    ED Course: Afebrile, blood pressure 140/60 O2 saturation 94% on 2 L.  Chest x-ray showed bilateral pulmonary congestion and small left-sided pleural effusion.  Blood work showed hemoglobin 9.8 compared to baseline 10.8-11.9, creatinine 0.7, K4.0.  Glucose 110  Patient was given IV Lasix 40 mg x 1 in the ED  Review of Systems: As per HPI otherwise 14 point review of systems negative.    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    Diabetes (HCC)    GERD (gastroesophageal reflux disease)    Hyperlipidemia    Hypertension    Obesity    Sleep apnea     Past Surgical History:  Procedure Laterality Date   BACK SURGERY     in the 80's -herniated disc   BACK SURGERY     L4 bone spur 1990   CARPAL TUNNEL RELEASE Right 12/28/2016   Procedure: RIGHT ULNAR AND MEDIAN NEUROPLASTY AT WRIST;  Surgeon: Mack Hook, MD;  Location: [facility];  Service: Orthopedics;  Laterality: Right;   CARPAL TUNNEL RELEASE Left    25 years ago   CATARACT EXTRACTION W/ INTRAOCULAR LENS IMPLANT Bilateral    REPLACEMENT TOTAL KNEE BILATERAL  2014   left 2012 rt 2014   SHOULDER  OPEN ROTATOR CUFF REPAIR Right 08/07/2019   Procedure: ROTATOR CUFF REPAIR SHOULDER OPEN;  Surgeon: Juanell Fairly, MD;  Location: [facility];  Service: Orthopedics;  Laterality: Right;   SKIN GRAFT Left 06/19/2022   left side of neck for skin cancer on top of head   TONSILLECTOMY       reports that he quit smoking about 26 years ago. His smoking use included cigarettes. He started smoking about 46 years ago. He has a 40 pack-year smoking history. He has never used smokeless tobacco. He reports current alcohol use. He reports  that he does not use drugs.  Allergies  Allergen Reactions   Penicillins Anaphylaxis, Rash and Other (See Comments)   Succinylcholine Chloride Anaphylaxis and Rash   Semaglutide Diarrhea   Keflex [Cephalexin] Rash   Sulfa Antibiotics Rash and Other (See Comments)    Family History  Problem Relation Age of Onset   Ovarian cancer Mother    Kidney failure Father    Cancer Brother      Prior to Admission medications   Medication Sig Start Date End Date Taking? Authorizing Provider  albuterol (VENTOLIN HFA) 108 (90 Base) MCG/ACT inhaler Inhale 2 puffs into the lungs every 4 (four) hours as needed. 05/27/22 05/27/23  [provider]  aspirin EC 81 MG tablet Take 81 mg by mouth daily. 06/04/22   [provider]  buPROPion (WELLBUTRIN XL) 300 MG 24 hr tablet Take 450 mg by mouth daily. 06/05/21   [provider]  carvedilol (COREG) 12.5 MG tablet Take 1 tablet (12.5 mg total) by mouth 2 (two) times daily with a meal. 08/17/19   Lurene Shadow, MD  cholecalciferol (VITAMIN D3) 25 MCG (1000 UNIT) tablet Take 2,000 Units by mouth daily.    [provider]  cyanocobalamin (VITAMIN B12) 1000 MCG tablet Take 5,000 mcg by mouth daily.    [provider]  donepezil (ARICEPT) 10 MG tablet Take 10 mg by mouth at bedtime. 08/18/22 11/16/22  [provider]  doxycycline (VIBRAMYCIN) 100 MG capsule Take 1 capsule (100 mg total) by mouth 2 (two) times daily. 03/31/23   Defelice, Para March, NP  gabapentin (NEURONTIN) 300 MG capsule Take 3 capsules (900 mg total) by mouth 3 (three) times daily. 10/21/18   Alford Highland, MD  HYDROcodone-acetaminophen (NORCO/VICODIN) 5-325 MG tablet Take 1 tablet by mouth every 6 (six) hours as needed for moderate pain. 11/11/22   Enedina Finner, MD  insulin glargine (LANTUS) 100 UNIT/ML injection Inject 30 Units into the skin daily.    [provider]  insulin lispro (HUMALOG) 100 UNIT/ML injection Inject 18 Units into the skin  in the morning and at bedtime. 18 in am. 8 in pm    [provider]  ketoconazole (NIZORAL) 2 % cream Apply 1 Application topically 2 (two) times daily. 09/09/22 09/09/23  [provider]  loratadine (CLARITIN) 10 MG tablet Take 10 mg by mouth daily as needed. 06/01/21   [provider]  losartan (COZAAR) 100 MG tablet Take 1 tablet (100 mg total) by mouth daily. 05/01/19   Georgina Quint, MD  magnesium oxide (MAG-OX) 400 (240 Mg) MG tablet Take 500 mg by mouth daily.    [provider]  metFORMIN (GLUCOPHAGE) 500 MG tablet Take 1,000 mg by mouth 2 (two) times daily with a meal.    [provider]  methocarbamol (ROBAXIN) 750 MG tablet Take 750 mg by mouth 4 (four) times daily.    [provider]  mometasone Derry Skill)  0.1 % cream Apply 1 Application topically daily. 08/13/22   [provider]  omeprazole (PRILOSEC) 20 MG capsule Take 40 mg by mouth 2 times daily at 12 noon and 4 pm.    [provider]  ondansetron (ZOFRAN-ODT) 4 MG disintegrating tablet Take 8 mg by mouth every 8 (eight) hours as needed for nausea. 07/05/22   [provider]  rosuvastatin (CRESTOR) 20 MG tablet Take 20 mg by mouth daily.    [provider]  senna-docusate (SENOKOT-S) 8.6-50 MG tablet Take 2 tablets by mouth daily. 06/01/21   [provider]  sertraline (ZOLOFT) 50 MG tablet 25 mg at night for one week, then 50 mg at night 02/17/23   Hisada, Barbee Cough, MD  tirzepatide [facility]) 2.5 MG/0.5ML Pen Inject 2.5 mg into the skin once a week.    [provider]  traZODone (DESYREL) 50 MG tablet Take 1 tablet by mouth at bedtime. 08/13/22 08/13/23  [provider]    Physical Exam: Vitals:   04/01/23 1006 04/01/23 1045 04/01/23 1100 04/01/23 1115  BP: (!) 144/64 (!) 158/77 (!) 144/66   Pulse: 69 70 66 65  Resp: (!) 22  17 16   Temp: 98.6 F (37 C)     TempSrc: Oral     SpO2: 94% 90% 92% 91%  Weight: (!)  154.2 kg     Height: 6\' 9"  (2.057 m)       Constitutional: NAD, calm, comfortable Vitals:   04/01/23 1006 04/01/23 1045 04/01/23 1100 04/01/23 1115  BP: (!) 144/64 (!) 158/77 (!) 144/66   Pulse: 69 70 66 65  Resp: (!) 22  17 16   Temp: 98.6 F (37 C)     TempSrc: Oral     SpO2: 94% 90% 92% 91%  Weight: (!) 154.2 kg     Height: 6\' 9"  (2.057 m)      Eyes: PERRL, lids and conjunctivae normal ENMT: Mucous membranes are moist. Posterior pharynx clear of any exudate or lesions.Normal dentition.  Neck: normal, supple, no masses, no thyromegaly Respiratory: clear to auscultation bilaterally, no wheezing, fine crackles on bilateral bases, increasing respiratory effort. No accessory muscle use.  Cardiovascular: Regular rate and rhythm, soft murmur on heart base. 2+ extremity edema. 2+ pedal pulses. No carotid bruits.  Abdomen: no tenderness, no masses palpated. No hepatosplenomegaly. Bowel sounds positive.  Musculoskeletal: no clubbing / cyanosis. No joint deformity upper and lower extremities. Good ROM, no contractures. Normal muscle tone.  Skin: no rashes, lesions, ulcers. No induration Neurologic: CN 2-12 grossly intact. Sensation intact, DTR normal. Strength 5/5 in all 4.  Psychiatric: Normal judgment and insight. Alert and oriented x 3. Normal mood.     Labs on Admission: I have personally reviewed following labs and imaging studies  CBC: Recent Labs  Lab 04/01/23 1016  WBC 5.6  HGB 9.8*  HCT 33.6*  MCV 90.8  PLT 186   Basic Metabolic Panel: Recent Labs  Lab 04/01/23 1016  NA 139  K 4.0  CL 105  CO2 25  GLUCOSE 110*  BUN 17  CREATININE 0.77  CALCIUM 8.9   GFR: Estimated Creatinine Clearance: 150.9 mL/min (by C-G formula based on SCr of 0.77 mg/dL). Liver Function Tests: Recent Labs  Lab 04/01/23 1016  AST 27  ALT 13  ALKPHOS 55  BILITOT 0.4  PROT 6.3*  ALBUMIN 3.6   No results for input(s): "LIPASE", "AMYLASE" in the last 168 hours. No results for  input(s): "AMMONIA" in the last 168 hours. Coagulation  Profile: No results for input(s): "INR", "PROTIME" in the last 168 hours. Cardiac Enzymes: No results for input(s): "CKTOTAL", "CKMB", "CKMBINDEX", "TROPONINI" in the last 168 hours. BNP (last 3 results) No results for input(s): "PROBNP" in the last 8760 hours. HbA1C: No results for input(s): "HGBA1C" in the last 72 hours. CBG: No results for input(s): "GLUCAP" in the last 168 hours. Lipid Profile: No results for input(s): "CHOL", "HDL", "LDLCALC", "TRIG", "CHOLHDL", "LDLDIRECT" in the last 72 hours. Thyroid Function Tests: No results for input(s): "TSH", "T4TOTAL", "FREET4", "T3FREE", "THYROIDAB" in the last 72 hours. Anemia Panel: No results for input(s): "VITAMINB12", "FOLATE", "FERRITIN", "TIBC", "IRON", "RETICCTPCT" in the last 72 hours. Urine analysis:    Component Value Date/Time   COLORURINE YELLOW (A) 05/23/2022 2140   APPEARANCEUR HAZY (A) 05/23/2022 2140   APPEARANCEUR Clear 05/30/2012 1136   LABSPEC 1.042 (H) 05/23/2022 2140   LABSPEC 1.033 05/30/2012 1136   PHURINE 5.0 05/23/2022 2140   GLUCOSEU 50 (A) 05/23/2022 2140   GLUCOSEU >=500 05/30/2012 1136   HGBUR MODERATE (A) 05/23/2022 2140   BILIRUBINUR NEGATIVE 05/23/2022 2140   BILIRUBINUR Negative 05/30/2012 1136   KETONESUR 5 (A) 05/23/2022 2140   PROTEINUR 100 (A) 05/23/2022 2140   NITRITE NEGATIVE 05/23/2022 2140   LEUKOCYTESUR NEGATIVE 05/23/2022 2140   LEUKOCYTESUR Negative 05/30/2012 1136    Radiological Exams on Admission: DG Chest 2 View  Result Date: 03/31/2023 CLINICAL DATA:  Cough, shortness of breath and congestion for 2 weeks. Symptoms began after getting a new CPAP machine. EXAM: CHEST - 2 VIEW COMPARISON:  One-view chest x-ray 11/09/2022.  CT chest 11/09/2022. FINDINGS: The heart size is normal. Diffuse interstitial coarsening is increased slightly. Areas of scarring previously seen are again noted. No new focal airspace disease is present.  The shunt tubing extends over the chest. Small bilateral effusions are present. IMPRESSION: 1. Slight increase in diffuse interstitial coarsening and small bilateral effusions. This may reflect pulmonary edema. 2. No new focal airspace disease. Electronically Signed   By: Marin Roberts M.D.   On: 03/31/2023 16:49    EKG: Independently reviewed.  Sinus rhythm, no acute ST changes, borderline prolonged QTc 480  Assessment/Plan Principal Problem:   CHF (congestive heart failure) (HCC) Active Problems:   (HFpEF) heart failure with preserved ejection fraction (HCC)  (please populate well all problems here in Problem List. (For example, if patient is on BP meds at home and you resume or decide to hold them, it is a problem that needs to be her. Same for CAD, COPD, HLD and so on)  Acute on chronic HFpEF decompensation -Significant fluid overload, will continue IV diuresis increase Lasix to 60 mg IV twice daily and titrate up according to response and kidney function. -Echocardiogram was done within 6 months, will not repeat this time. -Discussed with family and patient about strongly recommend patient set up care with cardiology to continue monitor chronic CHF as most recent echocardiogram in June this year showed mild hypokinesis in the left ventricle and CT chest earlier this year showed coronary artery calcification which is equivalent to CAD and warrants further workup.  Patient and family agreed. -DVT study -Other DDx, decided to continue doxycycline as ordered by his PCP outpatient to treat bronchitis probably associated with chronic aspiration from poorly controlled GERD. Patient scheduled to see [facility] in January.  Acute probably on chronic hypoxic respiratory failure -Maximize CHF treatment done reevaluate ambulatory pulse ox to evaluate for home O2  Chronic normocytic anemia -Denied any rectal bleeding or  dark-colored stool -Baseline hemoglobin about 10.9-11.7, compared to today  9.8 -Check iron study and reticulocyte count  Hx of normal pressure hydrocephalus -Status post VP shunt  OSA -CPAP HS -Outpatient follow-up with pulmonary for adjustment of home CPAP machine  GERD with chronic aspiration -PPI dosage doubled, outpatient follow-up with GI  IDDM -Glucose 110, hold off long-acting insulin today -SSI for now -Continue Metformin  Morbid obesity -BMI=36, recommend continue calorie control and outpatient evaluation for Ozempic  DVT prophylaxis: Lovenox Code Status: Full code Family Communication: Wife at bedside Disposition Plan: Patient is sick with CHF decompensation, requiring IV diuresis, and given his comorbidity of morbid obese, likely will require more than 2 midnight hospital stay for aggressive diuresis. Consults called: None Admission status: Tele admit   Emeline General MD [facility] Pager [phone]  04/01/2023, 11:51 AM

## 2023-04-01 NOTE — Progress Notes (Signed)
Patient arrived to room 260 from ED.  Assessment complete, VS obtained, and Admission database began.

## 2023-04-01 NOTE — ED Notes (Signed)
Pt. Placed on cont cardiac monitoring, called in.

## 2023-04-02 DIAGNOSIS — I5033 Acute on chronic diastolic (congestive) heart failure: Secondary | ICD-10-CM

## 2023-04-02 DIAGNOSIS — J9601 Acute respiratory failure with hypoxia: Secondary | ICD-10-CM | POA: Diagnosis not present

## 2023-04-02 LAB — BASIC METABOLIC PANEL
Anion gap: 10 (ref 5–15)
BUN: 19 mg/dL (ref 8–23)
CO2: 31 mmol/L (ref 22–32)
Calcium: 8.5 mg/dL — ABNORMAL LOW (ref 8.9–10.3)
Chloride: 101 mmol/L (ref 98–111)
Creatinine, Ser: 0.9 mg/dL (ref 0.61–1.24)
GFR, Estimated: >60 mL/min (ref >60)
Glucose, Bld: 132 mg/dL — ABNORMAL HIGH (ref 70–99)
Potassium: 3.5 mmol/L (ref 3.5–5.1)
Sodium: 142 mmol/L (ref 135–145)

## 2023-04-02 LAB — GLUCOSE, CAPILLARY
Glucose-Capillary: 129 mg/dL — ABNORMAL HIGH (ref 70–99)
Glucose-Capillary: 149 mg/dL — ABNORMAL HIGH (ref 70–99)
Glucose-Capillary: 115 mg/dL — ABNORMAL HIGH (ref 70–99)
Glucose-Capillary: 152 mg/dL — ABNORMAL HIGH (ref 70–99)

## 2023-04-02 LAB — VITAMIN B12: Vitamin B-12: 3851 pg/mL — ABNORMAL HIGH (ref 180–914)

## 2023-04-02 LAB — HEMOGLOBIN A1C
Hgb A1c MFr Bld: 6.2 % — ABNORMAL HIGH (ref 4.8–5.6)
Mean Plasma Glucose: 131.24 mg/dL

## 2023-04-02 MED ORDER — FLUTICASONE PROPIONATE 50 MCG/ACT NA SUSP
1.0000 | Freq: Every day | NASAL | Status: DC
Start: 2023-04-02 — End: 2023-04-05
  Administered 2023-04-02 – 2023-04-05 (×4): 1 via NASAL
  Filled 2023-04-02: qty 16

## 2023-04-02 MED ORDER — OXYCODONE-ACETAMINOPHEN 5-325 MG PO TABS
1.0000 | ORAL_TABLET | ORAL | Status: DC | PRN
  Administered 2023-04-02: 1 via ORAL
  Filled 2023-04-02: qty 1

## 2023-04-02 MED ORDER — OXYCODONE-ACETAMINOPHEN 5-325 MG PO TABS
1.0000 | ORAL_TABLET | ORAL | Status: DC | PRN
  Administered 2023-04-02 – 2023-04-04 (×7): 2 via ORAL
  Filled 2023-04-02 (×7): qty 2

## 2023-04-02 NOTE — Evaluation (Signed)
Physical Therapy Evaluation Patient Details Name: Nathan Dean MRN: [id number] DOB: 1956/04/16 Today's Date: 04/02/2023  History of Present Illness  67 y.o. male with medical history significant of chronic HFpEF, HTN, IDDM, diabetic peripheral neuropathy, chronic pain syndromes, severe GERD, OSA on CPAP at bedtime anxiety/depression, morbid obesity, brought in by family member for hypoxia.   Patient diagnosed with acute on chronic diastolic congestive heart failure, started on IV Lasix.   Clinical Impression  Patient received reclining in bed and agreeable to PT evaluation. Patient alert and oriented and able to provide detailed history. Patient lives with his wife who is available 24/7 in a 2 story home with 4 steps and bilateral handrails to enter where he lives on the main floor. Prior to hospitalization he was mod I/I with ADLs and got help with some IADLs from wife. He ambulated mod I with RW or rollator. He does not have oxygen at home. Upon PT evaluation, patient required supervision with use of handrails to perform bed mobility, supervision with bed elevated to match height at home for sit <> stand transfer to BRW, and ambulated 230 feet with BRW and CGA-supervision. He also completed 5 steps with B UE support on handrails and CGA in the middle of the ambulation. He was limited by fatigue and was reliant on RW for balance when standing. Patient's SpO2 reading 84% on room air upon arrival, increased to 88% momentarily when performing supine to sit, then went down as low as 77%. Applied 2L/min O2 for remainder of session. SpO2 rose to 92% at rest and was 84% at the end of ambulation/stair practice while on 2L/min (returned to 92% with rest). RN notified and she said his hands are cold and pulse ox does not read well - best finger is right 2nd digit. Patient reports he usually walks better at home; wife reports he had HHPT for an L2 fracture but Medicare stopped paying for it. He would benefit from  further PT upon discharge from acute care setting to return to prior level of function. Patient would benefit from skilled physical therapy to address impairments and functional limitations (see PT Problem List below) to work towards stated goals and return to PLOF or maximal functional independence.      If plan is discharge home, recommend the following: A little help with walking and/or transfers;A little help with bathing/dressing/bathroom;Assistance with cooking/housework;Assist for transportation;Help with stairs or ramp for entrance   Can travel by private vehicle        Equipment Recommendations None recommended by PT  Recommendations for Other Services       Functional Status Assessment Patient has had a recent decline in their functional status and demonstrates the ability to make significant improvements in function in a reasonable and predictable amount of time.     Precautions / Restrictions Precautions Precautions: Fall Restrictions Weight Bearing Restrictions: No      Mobility  Bed Mobility Overal bed mobility: Needs Assistance Bed Mobility: Supine to Sit, Sit to Supine     Supine to sit: Used rails, HOB elevated, Supervision Sit to supine: Supervision   General bed mobility comments: increased effort and heavy bed rail use    Transfers Overall transfer level: Needs assistance Equipment used: Rolling walker (2 wheels) (BRW) Transfers: Sit to/from Stand Sit to Stand: From elevated surface, Supervision           General transfer comment: bed elevated to mimic home setup 2/2 height    Ambulation/Gait Ambulation/Gait assistance: Contact guard assist,  Supervision Gait Distance (Feet): 230 Feet Assistive device: Rolling walker (2 wheels) (BRW) Gait Pattern/deviations: Decreased step length - left, Decreased stance time - right          Stairs Stairs: Yes Stairs assistance: Contact guard assist Stair Management: Two rails, Step to pattern,  Backwards, Forwards, Sideways Number of Stairs: 5 General stair comments: patient ascended/descended 5 steps with B UE support on HR, forwards up and sidways/backwards down due to not being able to turn around easily part way up the stairs.  Wheelchair Mobility     Tilt Bed    Modified Rankin (Stroke Patients Only)       Balance Overall balance assessment: Needs assistance Sitting-balance support: No upper extremity supported, Feet supported Sitting balance-Leahy Scale: Good     Standing balance support: Bilateral upper extremity supported, During functional activity, Reliant on assistive device for balance Standing balance-Leahy Scale: Fair                               Pertinent Vitals/Pain Pain Assessment Pain Assessment: 0-10 Pain Score: 8  Pain Location: back and shoulders Pain Descriptors / Indicators: Aching Pain Intervention(s): Limited activity within patient's tolerance, Monitored during session, Repositioned    Home Living Family/patient expects to be discharged to:: Private residence Living Arrangements: Spouse/significant other Available Help at Discharge: Available 24 hours/day;Family Type of Home: House Home Access: Stairs to enter Entrance Stairs-Rails: Right;Left;Can reach both Entrance Stairs-Number of Steps: 3   Home Layout: One level Home Equipment: Rollator (4 wheels);Cane - quad;Toilet riser;Other (comment);Rolling Walker (2 wheels);Grab bars - tub/shower;Grab bars - toilet;BSC/3in1;Tub bench      Prior Function Prior Level of Function : Independent/Modified Independent;History of Falls (last six months)             Mobility Comments: amb with RW household distances, rollator community distances (will lift rollator into the car) ADLs Comments: Generally MOD I with ADL, assist for IADLs from wife     Extremity/Trunk Assessment   Upper Extremity Assessment Upper Extremity Assessment: Overall WFL for tasks assessed    Lower  Extremity Assessment Lower Extremity Assessment: Generalized weakness    Cervical / Trunk Assessment Cervical / Trunk Assessment: Normal  Communication   Communication Communication: No apparent difficulties  Cognition Arousal: Alert Behavior During Therapy: WFL for tasks assessed/performed Overall Cognitive Status: Within Functional Limits for tasks assessed                                          General Comments General comments (skin integrity, edema, etc.): Patient's SpO2 reading 84% on room air upon arrival, increased to 88% momentarily when performing supine to sit, then went down as low as 77%. Applied 2L/min O2 for remainder of session. SpO2 rose to 92% at rest and was 84% at the end of ambulation/stair practice while on 2L/min (returned to 92% with rest). RN notified and she said his hands are cold and pulse ox does not read well - best finger is right 2nd digit.    Exercises Other Exercises Other Exercises: educated patient on role of PT in acute care setting and pt and wife on discharge reccomendations.   Assessment/Plan    PT Assessment Patient needs continued PT services  PT Problem List Decreased strength;Decreased mobility;Decreased activity tolerance;Cardiopulmonary status limiting activity;Decreased balance;Pain;Obesity       PT  Treatment Interventions DME instruction;Therapeutic exercise;Gait training;Balance training;Neuromuscular re-education;Stair training;Functional mobility training;Therapeutic activities;Patient/family education    PT Goals (Current goals can be found in the Care Plan section)  Acute Rehab PT Goals Patient Stated Goal: to go home PT Goal Formulation: With patient Time For Goal Achievement: 04/16/23 Potential to Achieve Goals: Good    Frequency Min 2X/week     Co-evaluation               AM-PAC PT "6 Clicks" Mobility  Outcome Measure Help needed turning from your back to your side while in a flat bed  without using bedrails?: A Little Help needed moving from lying on your back to sitting on the side of a flat bed without using bedrails?: A Little Help needed moving to and from a bed to a chair (including a wheelchair)?: A Little Help needed standing up from a chair using your arms (e.g., wheelchair or bedside chair)?: A Little Help needed to walk in hospital room?: A Little Help needed climbing 3-5 steps with a railing? : A Little 6 Click Score: 18    End of Session Equipment Utilized During Treatment: Gait belt;Oxygen Activity Tolerance: Patient tolerated treatment well;Patient limited by fatigue Patient left: in bed;with call bell/phone within reach;with bed alarm set Nurse Communication: Mobility status PT Visit Diagnosis: Other abnormalities of gait and mobility (R26.89);Muscle weakness (generalized) (M62.81);Pain Pain - part of body: Shoulder (neck, B shoulders, back)    Time: 1914-7829 PT Time Calculation (min) (ACUTE ONLY): 30 min   Charges:   PT Evaluation $PT Eval Low Complexity: 1 Low PT Treatments $Gait Training: 8-22 mins PT General Charges $$ ACUTE PT VISIT: 1 Visit         Huntley Dec R. Ilsa Iha, PT, DPT 04/02/23, 5:37 PM

## 2023-04-02 NOTE — Hospital Course (Signed)
Nathan Dean is a 67 y.o. male with medical history significant of chronic HFpEF, HTN, IDDM, diabetic peripheral neuropathy, chronic pain syndromes, severe GERD, OSA on CPAP at bedtime anxiety/depression, morbid obesity, brought in by family member for hypoxia.  Patient diagnosed with acute on chronic diastolic congestive heart failure, started on IV Lasix. Condition much improved, started Aldactone, Farxiga yesterday.  Continued on beta-blocker and ARB. Medically stable for discharge, patient so far has diuresed about about 13 L of IV fluids.  Edema all resolved.  Renal function still stable.

## 2023-04-02 NOTE — Evaluation (Signed)
Occupational Therapy Evaluation Patient Details Name: Nathan Dean MRN: [id number] DOB: [birth year] Today's Date: 04/02/2023   History of Present Illness 67 y.o. male with medical history significant of chronic HFpEF, HTN, IDDM, diabetic peripheral neuropathy, chronic pain syndromes, severe GERD, OSA on CPAP at bedtime anxiety/depression, morbid obesity, brought in by family member for hypoxia.   Patient diagnosed with acute on chronic diastolic congestive heart failure, started on IV Lasix.   Clinical Impression   Pt was seen for OT evaluation this date. Prior to hospital admission, pt was using a 2WW in the home and 4WW for community mobility, mod indep with ADL and receiving assist from spouse for IADL. Pt A&Ox4, pleasant, and agreeable despite 8/10 neck and upper back pain. RN notified per pt request. Pt presents to acute OT demonstrating impaired ADL performance and functional mobility 2/2 BLE edema, decr balance, and activity tolerance (See OT problem list for additional functional deficits). Pt currently requires MIN A for bed mobility (BLE mgt), MIN A for STS with RW with significantly elevated bed 2/2 height, and CGA for taking a few steps forward, backward, and to the side with RW. Pt denied SOB throughout. Pt would benefit from skilled OT services to address noted impairments and functional limitations (see below for any additional details) in order to maximize safety and independence while minimizing falls risk and caregiver burden. Pt is eager to improve in order to return home and resume HHOT/PT.     If plan is discharge home, recommend the following: A little help with walking and/or transfers;A little help with bathing/dressing/bathroom;Assistance with cooking/housework;Assist for transportation;Help with stairs or ramp for entrance    Functional Status Assessment  Patient has had a recent decline in their functional status and demonstrates the ability to make significant  improvements in function in a reasonable and predictable amount of time.  Equipment Recommendations  None recommended by OT    Recommendations for Other Services       Precautions / Restrictions Precautions Precautions: Fall Restrictions Weight Bearing Restrictions: No      Mobility Bed Mobility Overal bed mobility: Needs Assistance Bed Mobility: Supine to Sit, Sit to Supine     Supine to sit: Contact guard, Used rails, HOB elevated Sit to supine: Min assist   General bed mobility comments: increased effort and heavy bed rail use for sup>sit EOB, MIN A For BLE mgt back to bed    Transfers Overall transfer level: Needs assistance Equipment used: Rolling walker (2 wheels) Transfers: Sit to/from Stand Sit to Stand: Min assist, From elevated surface           General transfer comment: bed significantly elevated to mimic home setup 2/2 height      Balance Overall balance assessment: Needs assistance Sitting-balance support: No upper extremity supported, Feet supported Sitting balance-Leahy Scale: Good     Standing balance support: Bilateral upper extremity supported Standing balance-Leahy Scale: Fair                             ADL either performed or assessed with clinical judgement   ADL Overall ADL's : Needs assistance/impaired                     Lower Body Dressing: Sitting/lateral leans;Supervision/safety Lower Body Dressing Details (indicate cue type and reason): Pt able to doff/don socks seated EOB without direct assist. Anticipate need for CGA-MIN A for LB dressing incorporating STS transfers  Vision         Perception         Praxis         Pertinent Vitals/Pain Pain Assessment Pain Assessment: 0-10 Pain Score: 8  Pain Location: neck and upper back Pain Descriptors / Indicators: Aching Pain Intervention(s): Monitored during session, Premedicated before session, Repositioned, Patient  requesting pain meds-RN notified     Extremity/Trunk Assessment Upper Extremity Assessment Upper Extremity Assessment: Overall WFL for tasks assessed   Lower Extremity Assessment Lower Extremity Assessment: Defer to PT evaluation   Cervical / Trunk Assessment Cervical / Trunk Assessment: Normal   Communication Communication Communication: No apparent difficulties   Cognition Arousal: Alert Behavior During Therapy: WFL for tasks assessed/performed Overall Cognitive Status: Within Functional Limits for tasks assessed                                       General Comments       Exercises Other Exercises Other Exercises: Pt took steps front/back and to the side with heavy BUE reliance on RW. Denied SOB.   Shoulder Instructions      Home Living Family/patient expects to be discharged to:: Private residence Living Arrangements: Spouse/significant other Available Help at Discharge: Available 24 hours/day;Family Type of Home: House Home Access: Stairs to enter [employer] of Steps: 3 Entrance Stairs-Rails: Right;Left;Can reach both Home Layout: One level     Bathroom Shower/Tub: Chief Strategy Officer: Handicapped height     Home Equipment: Rollator (4 wheels);Cane - quad;Toilet riser;Other (comment);Rolling Walker (2 wheels);Grab bars - tub/shower;Grab bars - toilet;BSC/3in1;Tub bench          Prior Functioning/Environment Prior Level of Function : Independent/Modified Independent;History of Falls (last six months)             Mobility Comments: amb with RW household distances, rollator community distances (will lift rollator into the car) ADLs Comments: Generally MOD I with ADL, assist for IADLs from wife        OT Problem List: Impaired balance (sitting and/or standing);Decreased knowledge of use of DME or AE;Decreased activity tolerance;Pain      OT Treatment/Interventions: Self-care/ADL training;Therapeutic  exercise;Therapeutic activities;Patient/family education;DME and/or AE instruction;Balance training    OT Goals(Current goals can be found in the care plan section) Acute Rehab OT Goals Patient Stated Goal: get better adn go home to continue HHOT/PT OT Goal Formulation: With patient Time For Goal Achievement: 04/16/23 Potential to Achieve Goals: Good ADL Goals Pt Will Perform Lower Body Dressing: sit to/from stand;with modified independence (AE PRN) Pt Will Transfer to Toilet: with supervision;ambulating (LRAD, elevated commode) Pt Will Perform Toileting - Clothing Manipulation and hygiene: with modified independence Additional ADL Goal #1: Pt will verbalize plan to implement at least 1 learned falls prevention strategy to maximize safety.  OT Frequency: Min 1X/week    Co-evaluation              AM-PAC OT "6 Clicks" Daily Activity     Outcome Measure Help from another person eating meals?: None Help from another person taking care of personal grooming?: None Help from another person toileting, which includes using toliet, bedpan, or urinal?: A Little Help from another person bathing (including washing, rinsing, drying)?: A Little Help from another person to put on and taking off regular upper body clothing?: None Help from another person to put on and taking off regular lower body clothing?:  A Little 6 Click Score: 21   End of Session Equipment Utilized During Treatment: Rolling walker (2 wheels) Nurse Communication: Patient requests pain meds;Other (comment) (purewick malfunctioning)  Activity Tolerance: Patient tolerated treatment well Patient left: in bed;with call bell/phone within reach;with bed alarm set  OT Visit Diagnosis: Other abnormalities of gait and mobility (R26.89);Pain Pain - part of body:  (neck and upper back)                Time: 8119-1478 OT Time Calculation (min): 26 min Charges:  OT General Charges $OT Visit: 1 Visit OT Evaluation $OT Eval Moderate  Complexity: 1 Mod OT Treatments $Self Care/Home Management : 8-22 mins  Arman Filter., MPH, MS, OTR/L ascom [phone] 04/02/23, 3:59 PM

## 2023-04-02 NOTE — Progress Notes (Addendum)
  Progress Note   Patient: Nathan Dean UEA:[id number] DOB: 1956-02-20 DOA: 04/01/2023     1 DOS: the patient was seen and examined on 04/02/2023   Brief hospital course: Nathan Dean is a 67 y.o. male with medical history significant of chronic HFpEF, HTN, IDDM, diabetic peripheral neuropathy, chronic pain syndromes, severe GERD, OSA on CPAP at bedtime anxiety/depression, morbid obesity, brought in by family member for hypoxia.  Patient diagnosed with acute on chronic diastolic congestive heart failure, started on IV Lasix.   Principal Problem:   CHF (congestive heart failure) (HCC) Active Problems:   (HFpEF) heart failure with preserved ejection fraction (HCC)   Uncontrolled type 2 diabetes mellitus with hyperglycemia, with long-term current use of insulin (HCC)   HTN (hypertension)   Obstructive sleep apnea   Acute respiratory failure with hypoxia (HCC)   Morbid obesity (HCC)   Assessment and Plan: Acute on chronic diastolic congestive heart failure. Acute respiratory failure with hypoxemia secondary to exacerbation congestive heart failure. Patient had an echocardiogram performed 11/09/2022, ejection fraction was 55 to 60% with grade 1 diastolic dysfunction. Patient to present to the hospital with short of breath and hypoxemia, grossly overloaded, with significant leg edema.  Chest x-ray showed vascular congestion.  Patient was started on IV Lasix, will continue to follow closely.  Obstruct sleep apnea. Morbid obesity with BMI 35.21 with comorbidities. Patient will be on CPAP while asleep, diet exercise advised.  Type 2 diabetes. Continue sliding scale insulin.  Hemoglobin A1c 6.2.  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  Iron deficient anemia. Continue to follow, transfuse as needed. B12 level normal. Received IV iron.      Subjective:  Patient still have short of breath with exertion, but feels much better today.  Physical Exam: Vitals:    04/02/23 0532 04/02/23 0758 04/02/23 1242 04/02/23 1243  BP: (!) 146/66 (!) 177/82    Pulse: 68 70 74 73  Resp: 20 20    Temp: 98.5 F (36.9 C) 97.8 F (36.6 C)    TempSrc: Oral Oral    SpO2: 95% 97% 91% 95%  Weight:  (!) 149.1 kg    Height:       General exam: Appears calm and comfortable  Respiratory system: Clear to auscultation. Respiratory effort normal. Cardiovascular system: S1 & S2 heard, RRR. No JVD, murmurs, rubs, gallops or clicks.  Anasarca. Gastrointestinal system: Abdomen is nondistended, soft and nontender. No organomegaly or masses felt. Normal bowel sounds heard. Central nervous system: Alert and oriented. No focal neurological deficits. Extremities: 3+ leg edema Skin: No rashes, lesions or ulcers Psychiatry: Judgement and insight appear normal. Mood & affect appropriate.    Data Reviewed:  Chest x-ray and lab results reviewed.  Family Communication: None  Disposition: Status is: Inpatient Remains inpatient appropriate because: Severity of disease, IV treatment.     Time spent: 35 minutes  Author: Marrion Coy, MD 04/02/2023 12:51 PM  For on call review [email or web address].

## 2023-04-03 DIAGNOSIS — E876 Hypokalemia: Secondary | ICD-10-CM | POA: Insufficient documentation

## 2023-04-03 DIAGNOSIS — J9601 Acute respiratory failure with hypoxia: Secondary | ICD-10-CM | POA: Diagnosis not present

## 2023-04-03 DIAGNOSIS — I5033 Acute on chronic diastolic (congestive) heart failure: Secondary | ICD-10-CM | POA: Diagnosis not present

## 2023-04-03 LAB — BASIC METABOLIC PANEL
Anion gap: 10 (ref 5–15)
BUN: 23 mg/dL (ref 8–23)
CO2: 34 mmol/L — ABNORMAL HIGH (ref 22–32)
Calcium: 8.5 mg/dL — ABNORMAL LOW (ref 8.9–10.3)
Chloride: 95 mmol/L — ABNORMAL LOW (ref 98–111)
Creatinine, Ser: 0.86 mg/dL (ref 0.61–1.24)
GFR, Estimated: >60 mL/min (ref >60)
Glucose, Bld: 140 mg/dL — ABNORMAL HIGH (ref 70–99)
Potassium: 3.2 mmol/L — ABNORMAL LOW (ref 3.5–5.1)
Sodium: 139 mmol/L (ref 135–145)

## 2023-04-03 LAB — CBC
HCT: 33.5 % — ABNORMAL LOW (ref 39.0–52.0)
Hemoglobin: 10 g/dL — ABNORMAL LOW (ref 13.0–17.0)
MCH: 26.5 pg (ref 26.0–34.0)
MCHC: 29.9 g/dL — ABNORMAL LOW (ref 30.0–36.0)
MCV: 88.9 fL (ref 80.0–100.0)
Platelets: 188 10*3/uL (ref 150–400)
RBC: 3.77 MIL/uL — ABNORMAL LOW (ref 4.22–5.81)
RDW: 15.3 % (ref 11.5–15.5)
WBC: 6 10*3/uL (ref 4.0–10.5)
nRBC: 0 % (ref 0.0–0.2)

## 2023-04-03 LAB — GLUCOSE, CAPILLARY
Glucose-Capillary: 120 mg/dL — ABNORMAL HIGH (ref 70–99)
Glucose-Capillary: 165 mg/dL — ABNORMAL HIGH (ref 70–99)
Glucose-Capillary: 166 mg/dL — ABNORMAL HIGH (ref 70–99)
Glucose-Capillary: 125 mg/dL — ABNORMAL HIGH (ref 70–99)

## 2023-04-03 LAB — MAGNESIUM: Magnesium: 1.7 mg/dL (ref 1.7–2.4)

## 2023-04-03 MED ORDER — POTASSIUM CHLORIDE CRYS ER 20 MEQ PO TBCR
40.0000 meq | EXTENDED_RELEASE_TABLET | ORAL | Status: AC
  Administered 2023-04-03 (×2): 40 meq via ORAL
  Filled 2023-04-03: qty 2

## 2023-04-03 MED ORDER — DICLOFENAC SODIUM 1 % EX GEL
4.0000 g | Freq: Four times a day (QID) | CUTANEOUS | Status: DC
  Filled 2023-04-03: qty 100

## 2023-04-03 MED ORDER — MAGNESIUM SULFATE 2 GM/50ML IV SOLN
2.0000 g | Freq: Once | INTRAVENOUS | Status: AC
  Administered 2023-04-03: 2 g via INTRAVENOUS
  Filled 2023-04-03: qty 50

## 2023-04-03 MED ORDER — DICLOFENAC SODIUM 1 % EX GEL
4.0000 g | Freq: Four times a day (QID) | CUTANEOUS | Status: DC
  Administered 2023-04-03 – 2023-04-04 (×7): 4 g via TOPICAL
  Filled 2023-04-03: qty 100

## 2023-04-03 MED ORDER — MELATONIN 5 MG PO TABS
5.0000 mg | ORAL_TABLET | Freq: Once | ORAL | Status: DC
  Filled 2023-04-03: qty 1

## 2023-04-03 NOTE — Plan of Care (Signed)

## 2023-04-03 NOTE — Progress Notes (Signed)
  Progress Note   Patient: Nathan Dean WUJ:[id number] DOB: [birth year] DOA: 04/01/2023     2 DOS: the patient was seen and examined on 04/03/2023   Brief hospital course: Meritt Cerbone is a 67 y.o. male with medical history significant of chronic HFpEF, HTN, IDDM, diabetic peripheral neuropathy, chronic pain syndromes, severe GERD, OSA on CPAP at bedtime anxiety/depression, morbid obesity, brought in by family member for hypoxia.  Patient diagnosed with acute on chronic diastolic congestive heart failure, started on IV Lasix.   Active Problems:   (HFpEF) heart failure with preserved ejection fraction (HCC)   Uncontrolled type 2 diabetes mellitus with hyperglycemia, with long-term current use of insulin (HCC)   HTN (hypertension)   Obstructive sleep apnea   Acute on chronic diastolic CHF (congestive heart failure) (HCC)   Acute respiratory failure with hypoxia (HCC)   Morbid obesity (HCC)   Hypokalemia   Assessment and Plan:  Acute on chronic diastolic congestive heart failure. Acute respiratory failure with hypoxemia secondary to exacerbation congestive heart failure. Patient had an echocardiogram performed 11/09/2022, ejection fraction was 55 to 60% with grade 1 diastolic dysfunction. Patient to present to the hospital with short of breath and hypoxemia, grossly overloaded, with significant leg edema.  Chest x-ray showed vascular congestion.  Patient diuresing well on IV Lasix.  Renal function still stable, will give another day of IV Lasix.  Hypokalemia. Potassium 3.2 due to diuretics, give 80 mEq of oral potassium, magnesium 1.7, to give 2 g of magnesium IV. Recheck levels tomorrow.   Obstruct sleep apnea. Morbid obesity with BMI 35.21 with comorbidities. Patient will be on CPAP while asleep, diet exercise advised.   Type 2 diabetes. Continue sliding scale insulin.  Hemoglobin A1c 6.2.   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   Iron deficient  anemia. Continue to follow, transfuse as needed. B12 level normal. Hemoglobin stable.  IV iron ordered on 11/15 not given, called pharmacy to deliver.      Subjective:  Patient feel much better today, off oxygen.  Short of breath has improved.  Physical Exam: Vitals:   04/02/23 2217 04/03/23 0057 04/03/23 0348 04/03/23 0841  BP:  (!) 151/95 (!) 166/74 (!) 164/82  Pulse: 68 66 67 71  Resp: 16 16 16 20   Temp:  98 F (36.7 C) 98.2 F (36.8 C) 97.7 F (36.5 C)  TempSrc:    Oral  SpO2: 93% 96% 90% 95%  Weight:      Height:       General exam: Appears calm and comfortable, morbidly obese. Respiratory system: Clear to auscultation. Respiratory effort normal. Cardiovascular system: S1 & S2 heard, RRR. No JVD, murmurs, rubs, gallops or clicks. Gastrointestinal system: Abdomen is nondistended, soft and nontender. No organomegaly or masses felt. Normal bowel sounds heard. Central nervous system: Alert and oriented. No focal neurological deficits. Extremities: 2+ leg edema. Skin: No rashes, lesions or ulcers Psychiatry: Judgement and insight appear normal. Mood & affect appropriate.    Data Reviewed:  Lab results reviewed.  Family Communication: None  Disposition: Status is: Inpatient Remains inpatient appropriate because: Severity of disease, IV diuretics. Patient is seen by PT/OT, recommend home care.  Likely discharge tomorrow.     Time spent: 35 minutes  Author: Marrion Coy, MD 04/03/2023 10:27 AM  For on call review [email or web address].

## 2023-04-04 ENCOUNTER — Other Ambulatory Visit (HOSPITAL_COMMUNITY): Payer: Self-pay

## 2023-04-04 ENCOUNTER — Encounter: Payer: Self-pay | Admitting: Internal Medicine

## 2023-04-04 DIAGNOSIS — I5033 Acute on chronic diastolic (congestive) heart failure: Secondary | ICD-10-CM | POA: Diagnosis not present

## 2023-04-04 DIAGNOSIS — J9601 Acute respiratory failure with hypoxia: Secondary | ICD-10-CM | POA: Diagnosis not present

## 2023-04-04 LAB — CBC
HCT: 39.4 % (ref 39.0–52.0)
Hemoglobin: 11.8 g/dL — ABNORMAL LOW (ref 13.0–17.0)
MCH: 26.3 pg (ref 26.0–34.0)
MCHC: 29.9 g/dL — ABNORMAL LOW (ref 30.0–36.0)
MCV: 87.9 fL (ref 80.0–100.0)
Platelets: 213 10*3/uL (ref 150–400)
RBC: 4.48 MIL/uL (ref 4.22–5.81)
RDW: 15.3 % (ref 11.5–15.5)
WBC: 6.3 10*3/uL (ref 4.0–10.5)
nRBC: 0 % (ref 0.0–0.2)

## 2023-04-04 LAB — BASIC METABOLIC PANEL
Anion gap: 12 (ref 5–15)
BUN: 26 mg/dL — ABNORMAL HIGH (ref 8–23)
CO2: 33 mmol/L — ABNORMAL HIGH (ref 22–32)
Calcium: 9.3 mg/dL (ref 8.9–10.3)
Chloride: 96 mmol/L — ABNORMAL LOW (ref 98–111)
Creatinine, Ser: 0.8 mg/dL (ref 0.61–1.24)
GFR, Estimated: >60 mL/min (ref >60)
Glucose, Bld: 152 mg/dL — ABNORMAL HIGH (ref 70–99)
Potassium: 3.2 mmol/L — ABNORMAL LOW (ref 3.5–5.1)
Sodium: 141 mmol/L (ref 135–145)

## 2023-04-04 LAB — GLUCOSE, CAPILLARY
Glucose-Capillary: 153 mg/dL — ABNORMAL HIGH (ref 70–99)
Glucose-Capillary: 186 mg/dL — ABNORMAL HIGH (ref 70–99)
Glucose-Capillary: 145 mg/dL — ABNORMAL HIGH (ref 70–99)
Glucose-Capillary: 168 mg/dL — ABNORMAL HIGH (ref 70–99)

## 2023-04-04 LAB — MAGNESIUM: Magnesium: 2 mg/dL (ref 1.7–2.4)

## 2023-04-04 MED ORDER — POTASSIUM CHLORIDE CRYS ER 20 MEQ PO TBCR
40.0000 meq | EXTENDED_RELEASE_TABLET | ORAL | Status: AC
Start: 2023-04-04 — End: 2023-04-04
  Administered 2023-04-04 (×2): 40 meq via ORAL
  Filled 2023-04-04: qty 2

## 2023-04-04 MED ORDER — SPIRONOLACTONE 25 MG PO TABS
25.0000 mg | ORAL_TABLET | Freq: Every day | ORAL | Status: DC
  Administered 2023-04-04 – 2023-04-05 (×2): 25 mg via ORAL
  Filled 2023-04-04 (×2): qty 1

## 2023-04-04 MED ORDER — DAPAGLIFLOZIN PROPANEDIOL 10 MG PO TABS
10.0000 mg | ORAL_TABLET | Freq: Every day | ORAL | Status: DC
  Administered 2023-04-04 – 2023-04-05 (×2): 10 mg via ORAL
  Filled 2023-04-04 (×2): qty 1

## 2023-04-04 MED ORDER — POTASSIUM CHLORIDE CRYS ER 20 MEQ PO TBCR: 40.0000 meq | EXTENDED_RELEASE_TABLET | Freq: Once | ORAL | Status: DC

## 2023-04-04 NOTE — Progress Notes (Signed)
Physical Therapy Treatment Patient Details Name: Princeamir Asad MRN: [id number] DOB: Oct 19, 1955 Today's Date: 04/04/2023   History of Present Illness 67 y.o. male with medical history significant of chronic HFpEF, HTN, IDDM, diabetic peripheral neuropathy, chronic pain syndromes, severe GERD, OSA on CPAP at bedtime anxiety/depression, morbid obesity, brought in by family member for hypoxia.   Patient diagnosed with acute on chronic diastolic congestive heart failure, started on IV Lasix.    PT Comments  Pt received in bed on RA and agreed to PT session. Pt performs bed mobility SUP with the use of HOB elevated/bed rails and x2STS with the use of RW (2wheels) MinA that decreased to CGA for standing balance. Pt reported dizziness while seated EOB which subsided with pursed lip breathing as well as dizziness with standing which required pt to take a seated rest break in between each STS. Vitals at the beginning of session read: 170/94 mmHg and 87% SpO2 on RA while seated EOB. 3L of O2 applied prior to mobility. Vitals at the end of session read: 132/85 mmHg, 98% SpO2 on 3L of O2, and 85 bpm while seated EOB. Pt ended session in bed 90% SpO2 on RA. Pt did not tolerate Tx well today and will continue to benefit from skilled PT sessions to improve endurance and activity tolerance to maximize IND following D/C.   If plan is discharge home, recommend the following: A little help with walking and/or transfers;A little help with bathing/dressing/bathroom;Assistance with cooking/housework;Assist for transportation;Help with stairs or ramp for entrance   Can travel by private vehicle        Equipment Recommendations  None recommended by PT    Recommendations for Other Services       Precautions / Restrictions Precautions Precautions: Fall Restrictions Weight Bearing Restrictions: No     Mobility  Bed Mobility Overal bed mobility: Needs Assistance Bed Mobility: Supine to Sit, Sit to Supine      Supine to sit: Used rails, HOB elevated, Supervision Sit to supine: Supervision, HOB elevated, Used rails   General bed mobility comments: Pt performed bed mobility SUP with increased time necessary to perform movement.    Transfers Overall transfer level: Needs assistance Equipment used: Rolling walker (2 wheels) (BRW) Transfers: Sit to/from Stand Sit to Stand: From elevated surface, Min assist           General transfer comment: Pt performed x2STS with the use of RW (2wheels) MinA. Pt reported dizziness while standing and needed a rest break per STS.    Ambulation/Gait               General Gait Details: deferred   Stairs             Wheelchair Mobility     Tilt Bed    Modified Rankin (Stroke Patients Only)       Balance Overall balance assessment: Needs assistance Sitting-balance support: No upper extremity supported, Feet supported Sitting balance-Leahy Scale: Good     Standing balance support: Bilateral upper extremity supported, During functional activity, Reliant on assistive device for balance Standing balance-Leahy Scale: Fair                              Cognition Arousal: Alert Behavior During Therapy: WFL for tasks assessed/performed Overall Cognitive Status: Within Functional Limits for tasks assessed  General Comments: AOx4. Pt pleasant and willing to participate in PT session.        Exercises      General Comments        Pertinent Vitals/Pain Pain Assessment Pain Assessment: No/denies pain    Home Living                          Prior Function            PT Goals (current goals can now be found in the care plan section) Acute Rehab PT Goals Patient Stated Goal: to go home PT Goal Formulation: With patient Time For Goal Achievement: 04/16/23 Potential to Achieve Goals: Good Progress towards PT goals: Progressing toward goals     Frequency    Min 1X/week      PT Plan      Co-evaluation              AM-PAC PT "6 Clicks" Mobility   Outcome Measure  Help needed turning from your back to your side while in a flat bed without using bedrails?: A Little Help needed moving from lying on your back to sitting on the side of a flat bed without using bedrails?: A Little Help needed moving to and from a bed to a chair (including a wheelchair)?: A Little Help needed standing up from a chair using your arms (e.g., wheelchair or bedside chair)?: A Little Help needed to walk in hospital room?: A Little Help needed climbing 3-5 steps with a railing? : A Little 6 Click Score: 18    End of Session Equipment Utilized During Treatment: Gait belt;Oxygen Activity Tolerance: Patient limited by fatigue Patient left: in bed;with call bell/phone within reach Nurse Communication: Mobility status PT Visit Diagnosis: Other abnormalities of gait and mobility (R26.89);Muscle weakness (generalized) (M62.81);Pain     Time: 1415-1440 PT Time Calculation (min) (ACUTE ONLY): 25 min  Charges:    $Therapeutic Activity: 23-37 mins PT General Charges $$ ACUTE PT VISIT: 1 Visit                     Aragorn Recker [place] SPT, LAT, ATC   Ambrielle Kington [place] 04/04/2023, 4:57 PM

## 2023-04-04 NOTE — Progress Notes (Signed)
Heart Failure Stewardship Pharmacy Note  PCP: Lorrine Kin, MD PCP-Cardiologist: None  HPI: Nathan Dean is a 67 y.o. male with HFpEF, hypertension, type II diabetes, diabetic peripheral neuropathy, chronic pain syndrome, severe GERD, OSA on CPAP at bedtime, anxiety/depression, morbid obesity  who presented with hypoxia with O2 saturation in the 70s on home pulse oximetry. Recently acquired a CPAP, however reports the CPAP here does not seal correctly. Recently diagnosed with bronchitis by PCP.  BNP on admission was mildly elevated to 252.6. HS-troponin was normal. CXR on admission suggestive of pulmonary edema.  Pertinent cardiac history: Echo in 07/2019 showed LVEF of 60-65% with grade I diastolic dysfunction. Echo 10/2022 showed LVEF of 55-60% with moderate LVH, grade I diastolic dysfunction and hypokinesis of the entire inferior wall.  Pertinent Lab Values: Creatinine  Date Value Ref Range Status  06/25/2012 0.66 0.60 - 1.30 mg/dL Final   Creatinine, Ser  Date Value Ref Range Status  04/04/2023 0.80 0.61 - 1.24 mg/dL Final   BUN  Date Value Ref Range Status  04/04/2023 26 (H) 8 - 23 mg/dL Final  44/05/270 24 8 - 27 mg/dL Final  53/66/4403 9 7 - 18 mg/dL Final   Potassium  Date Value Ref Range Status  04/04/2023 3.2 (L) 3.5 - 5.1 mmol/L Final  06/25/2012 3.8 3.5 - 5.1 mmol/L Final   Sodium  Date Value Ref Range Status  04/04/2023 141 135 - 145 mmol/L Final  07/20/2017 140 134 - 144 mmol/L Final  06/25/2012 135 (L) 136 - 145 mmol/L Final   B Natriuretic Peptide  Date Value Ref Range Status  04/01/2023 252.6 (H) 0.0 - 100.0 pg/mL Final    Comment:    Performed at [facility], [address] [place], [place] [address]   Magnesium  Date Value Ref Range Status  04/04/2023 2.0 1.7 - 2.4 mg/dL Final    Comment:    Performed at [facility], [address] [place], [place] [address]   Hgb A1c MFr Bld  Date Value Ref Range Status   04/01/2023 6.2 (H) 4.8 - 5.6 % Final    Comment:    (NOTE) Pre diabetes:          5.7%-6.4%  Diabetes:              >6.4%  Glycemic control for   <7.0% adults with diabetes    TSH  Date Value Ref Range Status  04/01/2023 0.817 0.350 - 4.500 uIU/mL Final    Comment:    Performed by a 3rd Generation assay with a functional sensitivity of <=0.01 uIU/mL. Performed at [facility], [address] [place], [place] [address]   12/23/2014 1.050 0.450 - 4.500 uIU/mL Final    Vital Signs: Temp:  [97.8 F (36.6 C)-98.5 F (36.9 C)] 98.3 F (36.8 C) (11/18 0830) Pulse Rate:  [75-78] 76 (11/18 0830) Cardiac Rhythm: Normal sinus rhythm (11/18 1052) Resp:  [15-19] 18 (11/18 0830) BP: (162-192)/(62-94) 192/94 (11/18 0830) SpO2:  [86 %-92 %] 92 % (11/18 0830)  Intake/Output Summary (Last 24 hours) at 04/04/2023 1107 Last data filed at 04/04/2023 1055 Gross per 24 hour  Intake 522.21 ml  Output 4600 ml  Net -4077.79 ml    Current Heart Failure Medications:  Loop diuretic: furosemide 60 gm IV BID Beta-Blocker: carvedilol 12.5 mg BID ACEI/ARB/ARNI: losartan 25 mg daily MRA: none SGLT2i: none Other: none  Prior to admission Heart Failure Medications:  Loop diuretic: none Beta-Blocker: carvedilol 12.5 mg BID ACEI/ARB/ARNI: losartan  25 mg daily MRA: none SGLT2i: none  Assessment: 1. Acute on chronic diastolic heart failure (LVEF 55-60%) with grade I diastolic dysfunction, due to presumed NICM. NYHA class II-III symptoms.  -Symptoms: Denies shortness of breath and orthopnea. Reports LEE that is improved from admission. -Volume: Appears to be reaching euvolemic, urine color is darker and LEE appears lessened. Creatinine is stable. BUN starting to trend up. Can consider transition to oral diuretics tomorrow. -Hemodynamics: Has been markedly hypertensive up to 190s. HR is in 70s. Would benefit from afterload reduction -SGLT2i: Would benefit from SGLT2i as firs tline for  HFpEF -MRA: Would benefit from spironolactone for HFpEF, hypertension, and hypokalemia -ARNI: Can consider transition from losartan to [facility] pending BP after initiating spironolactone -Given that carvedilol has no benefit in HFpEF and can worsen exercise capacity, can consider decreasing if BP if BP ever becomes controlled in favor of medications that benefit HFpEF.  Plan: 1) Medication changes recommended at this time: -Consider adding Farxiga 10 mg daily -Consider adding spironolactone 25 mg daily  2) Patient assistance: -Patient is in the coverage gap. Sherryll Burger is $161.76, Marcelline Deist is $136.90, and London Pepper is $143.69. 30 day free cards can be applied for along with clinic samples until the coverage gap is gone next year.  3) Education: - Patient has been educated on current HF medications and potential additions to HF medication regimen - Patient verbalizes understanding that over the next few months, these medication doses may change and more medications may be added to optimize HF regimen - Patient has been educated on basic disease state pathophysiology and goals of therapy  Medication Assistance / Insurance Benefits Check: Does the patient have prescription insurance?    Type of insurance plan:  Does the patient qualify for medication assistance through manufacturers or grants? Pending  Approved medication assistance renewals will be completed by: pending  Outpatient Pharmacy[facility]   Is the patient willing to utilize a [facility] pharmacy at discharge?: Yes Please do not hesitate to reach out with questions or concerns,  Enos Fling, PharmD, CPP, BCPS Heart Failure Pharmacist  Phone - [phone] 04/04/2023 11:07 AM

## 2023-04-04 NOTE — TOC Benefit Eligibility Note (Signed)
Pharmacy Patient Advocate Encounter  Insurance verification completed.    The patient is insured through [employer] for [employer]. Currently a quantity of 60 is a 30 day supply and the co-pay is 161.76 .  Ran test claim for [place]. Currently a quantity of 30 is a 30 day supply and the co-pay is $136.90 .  Ran test claim for [place]. Currently a quantity of 30 is a 30 day supply and the co-pay is $143.69 .  Looks as if patient is in the coverage gap  This test claim was processed through [facility]- copay amounts may vary at other pharmacies due to pharmacy/plan contracts, or as the patient moves through the different stages of their insurance plan.

## 2023-04-04 NOTE — TOC Initial Note (Signed)
Transition of Care [facility]) - Initial/Assessment Note    Patient Details  Name: Nathan Dean MRN: [id number] Date of Birth: 1956-01-20  Transition [facility] Care [facility]) CM/SW Contact:    Darolyn Rua, LCSW Phone Number: 04/04/2023, 1:16 PM  Clinical Narrative:                  CSW met with patient at bedside to review [facility] services, he reports he was active with someone recently and would like to work with same agency. Upon chart review this was [facility], referral given to [place] with [facility]. He reports no dme needs he has a walker at home and wife to transport at discharge. PCP Dr. Antoine Primas.   No additional discharge needs.   Expected Discharge Plan: Home w Home Health Services Barriers to Discharge: Continued Medical Work up   Patient Goals and CMS Choice Patient states their goals for this hospitalization and ongoing recovery are:: to go home CMS Medicare.gov Compare Post Acute Care list provided to:: Patient Choice offered to / list presented to : Patient      Expected Discharge Plan and Services       Living arrangements for the past 2 months: Single Family Home                                      Prior Living Arrangements/Services Living arrangements for the past 2 months: Single Family Home Lives with:: Self                   Activities of Daily Living      Permission Sought/Granted                  Emotional Assessment              Admission diagnosis:  CHF (congestive heart failure) (HCC) [I50.9] Acute respiratory failure with hypoxia (HCC) [J96.01] Acute congestive heart failure, unspecified heart failure type (HCC) [I50.9] Patient Active Problem List   Diagnosis Date Noted   Hypokalemia 04/03/2023   Shortness of breath 03/31/2023   Elevated blood pressure reading 03/31/2023   Compression fracture of L2 lumbar vertebra (HCC) 11/09/2022   Fall at home, initial encounter 11/09/2022   History of hydrocephalus 11/09/2022    (HFpEF) heart failure with preserved ejection fraction (HCC) 11/09/2022   Acute respiratory failure with hypoxia (HCC) 05/26/2022   Influenza A 05/26/2022   Morbid obesity (HCC) 05/26/2022   Localized pain of left shoulder joint 02/02/2022   Disorder of left suprascapular nerve 02/02/2022   Chronic right shoulder pain 07/30/2021   Cervical facet joint syndrome 07/30/2021   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/2021   Chronic pain syndrome 07/30/2021   History of bilateral knee replacement 07/30/2021   Bilateral chronic knee pain 07/30/2021   Post laminectomy syndrome 07/30/2021   Acute on chronic diastolic CHF (congestive heart failure) (HCC) 08/17/2019   Edema    Acute metabolic encephalopathy 08/12/2019   Acute pulmonary edema (HCC) 08/12/2019   Acute cor pulmonale (HCC) 08/11/2019   Oxygen desaturation 08/08/2019   S/P right rotator cuff repair 08/07/2019   Hypoxia 10/22/2018   Infection of right prepatellar bursa 10/18/2018   Sepsis (HCC) 10/18/2018   Chest congestion 07/20/2017   Viral illness 07/20/2017   Lower resp. tract infection 07/12/2017   Laryngitis, acute 07/12/2017   Cough 07/12/2017   Pleurisy 07/12/2017   Depression 06/03/2015   HTN (  hypertension) 01/02/2015   Hyperlipidemia 01/02/2015   Uncontrolled type 2 diabetes mellitus with hyperglycemia, with long-term current use of insulin (HCC) 12/23/2014   Obstructive sleep apnea 09/21/2010   Arthritis, degenerative 02/17/2009   Hereditary and idiopathic peripheral neuropathy 11/21/2008   AD (atopic dermatitis) 07/08/2008   Acid reflux 04/05/2007   PCP:  Lorrine Kin, MD Pharmacy:   [phone] [place] [place] - [address] [place] [address] Phone: [phone] Fax: [phone]  [facility] [address] [place] [address] Phone: [phone] Fax: [phone]     Social Determinants of Health (SDOH) Social  History: SDOH Screenings   Food Insecurity: No Food Insecurity (11/09/2022)  Housing: Low Risk  (11/09/2022)  Transportation Needs: No Transportation Needs (11/09/2022)  Utilities: Not At Risk (11/09/2022)  Depression (PHQ2-9): High Risk (02/17/2023)  Financial Resource Strain: Low Risk  (05/27/2022)   Received from [facility], [facility]  Physical Activity: Inactive (02/26/2020)   Received from [facility], [facility]  Social Connections: Unknown (09/25/2021)   Received from [facility], [facility]  Stress: No Stress Concern Present (02/26/2020)   Received from [facility], [facility]  Tobacco Use: Medium Risk (04/01/2023)   SDOH Interventions:     Readmission Risk Interventions     No data to display

## 2023-04-04 NOTE — Progress Notes (Addendum)
  Progress Note   Patient: Nathan Dean YQM:[id number] DOB: [birth year] DOA: 04/01/2023     3 DOS: the patient was seen and examined on 04/04/2023   Brief hospital course: Nathan Dean is a 67 y.o. male with medical history significant of chronic HFpEF, HTN, IDDM, diabetic peripheral neuropathy, chronic pain syndromes, severe GERD, OSA on CPAP at bedtime anxiety/depression, morbid obesity, brought in by family member for hypoxia.  Patient diagnosed with acute on chronic diastolic congestive heart failure, started on IV Lasix.   Active Problems:   (HFpEF) heart failure with preserved ejection fraction (HCC)   Uncontrolled type 2 diabetes mellitus with hyperglycemia, with long-term current use of insulin (HCC)   HTN (hypertension)   Obstructive sleep apnea   Acute on chronic diastolic CHF (congestive heart failure) (HCC)   Acute respiratory failure with hypoxia (HCC)   Morbid obesity (HCC)   Hypokalemia   Assessment and Plan: Acute on chronic diastolic congestive heart failure. Acute respiratory failure with hypoxemia secondary to exacerbation congestive heart failure. Patient had an echocardiogram performed 11/09/2022, ejection fraction was 55 to 60% with grade 1 diastolic dysfunction. Patient to present to the hospital with short of breath and hypoxemia, grossly overloaded, with significant leg edema.  Chest x-ray showed vascular congestion.  Patient has been treated with IV Lasix, condition improving, however, creatinine level still going down, will benefit for another day of IV Lasix.  Patient has been drinking more water than normal, I have placed a fluid restriction. Start spironolactone and [place].   Hypokalemia. Given more potassium today,  also added spironolactone.   Obstruct sleep apnea. Morbid obesity with BMI 35.21 with comorbidities. Patient will be on CPAP while asleep, diet exercise advised.   Type 2 diabetes. Continue sliding scale insulin.  Hemoglobin A1c  6.2.   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   Iron deficient anemia. Hemoglobin has been stable, continue oral iron.         Subjective: Patient feels better today, could not tolerate our CPAP.  Asked wife to bring her home CPAP machine.  Physical Exam: Vitals:   04/03/23 1624 04/03/23 2048 04/03/23 2128 04/04/23 0830  BP: (!) 162/82 (!) 181/62  (!) 192/94  Pulse: 78 75  76  Resp:   15 18  Temp: 98 F (36.7 C) 98.5 F (36.9 C)  98.3 F (36.8 C)  TempSrc: Oral Oral  Oral  SpO2: 91% (!) 86%  92%  Weight:      Height:       General exam: Appears calm and comfortable,  Respiratory system: Clear to auscultation. Respiratory effort normal. Cardiovascular system: S1 & S2 heard, RRR. No JVD, murmurs, rubs, gallops or clicks. 2+ leg edema Gastrointestinal system: Abdomen is nondistended, soft and nontender. No organomegaly or masses felt. Normal bowel sounds heard. Central nervous system: Alert and oriented. No focal neurological deficits. Extremities:  Skin: No rashes, lesions or ulcers Psychiatry: Judgement and insight appear normal. Mood & affect appropriate.    Data Reviewed:    Family Communication: wife updated over the phone  Disposition: Status is: Inpatient Remains inpatient appropriate because: Severity of disease, IV treatment.     Time spent: 35 minutes  Author: Marrion Coy, MD 04/04/2023 10:50 AM  For on call review [email or web address].

## 2023-04-04 NOTE — Care Management Important Message (Signed)
Important Message  Patient Details  Name: Nathan Dean MRN: [id number] Date of Birth: 1956/04/04   Important Message Given:  Yes - Medicare IM     Verita Schneiders Deretha Ertle 04/04/2023, 2:28 PM

## 2023-04-04 NOTE — Progress Notes (Signed)
Heart Failure Nurse Navigator Progress Note  PCP: Lorrine Kin, MD PCP-Cardiologist: None Admission Diagnosis:  Acute respiratory failure with hypoxia Acute Congestive heart failure, unspecified heart failure Admitted from: Home  Presentation:   Nathan Dean presented with gradually worsening shortness of breath over the last 2 weeks. Acute cough. Pt thought this may have been related to improper CPAP settings, went to urgent care had x-ray performed and started on doxycycline for possible upper respiratory infection.  Wife reported that the patient was significantly hypoxic.   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    Diabetes (HCC)    GERD (gastroesophageal reflux disease)    Hyperlipidemia    Hypertension    Obesity    Sleep apnea      Social History   Socioeconomic History   Marital status: Married    Spouse name: Britta Mccreedy   Number of children: 0   Years of education: Not on file   Highest education level: Associate degree: academic program  Occupational History   Occupation: Retired  Tobacco Use   Smoking status: Former    Current packs/day: 0.00    Average packs/day: 2.0 packs/day for 20.0 years (40.0 ttl pk-yrs)    Types: Cigarettes    Start date: 06/28/1976    Quit date: 06/28/1996    Years since quitting: 26.7   Smokeless tobacco: Never  Vaping Use   Vaping status: Never Used  Substance and Sexual Activity   Alcohol use: Yes    Comment: occasionally   Drug use: No   Sexual activity: Not on file    Comment: not asked if sexually active  Other Topics Concern   Not on file  Social History Narrative   Right handed    Lives at home spouse    Caffeine 5 -6 cups     Social Determinants of Health   Financial Resource Strain: Low Risk  (04/04/2023)   Overall Financial Resource Strain (CARDIA)    Difficulty of Paying Living  Expenses: Not hard at all  Food Insecurity: No Food Insecurity (11/09/2022)   Hunger Vital Sign    Worried About Running Out of Food in the Last Year: Never true    Ran Out of Food in the Last Year: Never true  Transportation Needs: No Transportation Needs (04/04/2023)   PRAPARE - Administrator, Civil Service (Medical): No    Lack of Transportation (Non-Medical): No  Physical Activity: Inactive (02/26/2020)   [facility], [facility]   Exercise Vital Sign    Days of Exercise per Week: 0 days    Minutes of Exercise per Session: 0 min  Stress: No Stress Concern Present (02/26/2020)   Received from [facility], [facility]   [employer] of Occupational Health - Occupational Stress Questionnaire    Feeling of Stress : Not at all  Social Connections: Unknown (09/25/2021)   Received from [facility], [facility]   Social Network    Social Network: Not on file   Education Assessment and Provision:  Detailed education and instructions provided on heart failure disease management including the following:  Signs and symptoms of Heart Failure When to call the physician Importance of daily weights Low sodium diet Fluid restriction Medication management Anticipated future follow-up appointments-04/11/23 @ 2:30 pm  Patient education given on each of the above topics.  Patient acknowledges understanding via teach back method and acceptance of all instructions.  Education Materials:  "Living Better With Heart Failure" Booklet, HF zone tool, & Daily Weight Tracker Tool.  Patient has scale at home: Yes Patient has pill box at home: Yes   High Risk Criteria for Readmission and/or Poor Patient Outcomes: Heart failure hospital admissions (last 6 months): 1  No Show rate: 5 Difficult social situation: None Demonstrates medication adherence: Yes Primary Language: English Literacy level:  Reading, Writing & Comprehension  Barriers of Care:   Diet & Fluid Restrictions Daily weights Medication Compliance  Considerations/Referrals:   Referral made to Heart Failure Pharmacist Stewardship: Yes Referral made to CSW/NCM TOC: No Referral made to Heart & Vascular TOC clinic: Yes  Items for Follow-up on DC/TOC: Diet & Fluid Restrictions Daily Weights Medication Compliance Continued Heart Failure Education  Roxy Horseman, RN, BSN [facility] Heart Failure Navigator Secure Chat Only

## 2023-04-05 ENCOUNTER — Other Ambulatory Visit: Payer: Self-pay

## 2023-04-05 DIAGNOSIS — G4733 Obstructive sleep apnea (adult) (pediatric): Secondary | ICD-10-CM

## 2023-04-05 DIAGNOSIS — I5033 Acute on chronic diastolic (congestive) heart failure: Secondary | ICD-10-CM | POA: Diagnosis not present

## 2023-04-05 DIAGNOSIS — J9601 Acute respiratory failure with hypoxia: Secondary | ICD-10-CM | POA: Diagnosis not present

## 2023-04-05 LAB — BASIC METABOLIC PANEL
Anion gap: 14 (ref 5–15)
BUN: 35 mg/dL — ABNORMAL HIGH (ref 8–23)
CO2: 32 mmol/L (ref 22–32)
Calcium: 9.4 mg/dL (ref 8.9–10.3)
Chloride: 96 mmol/L — ABNORMAL LOW (ref 98–111)
Creatinine, Ser: 1.1 mg/dL (ref 0.61–1.24)
GFR, Estimated: >60 mL/min (ref >60)
Glucose, Bld: 151 mg/dL — ABNORMAL HIGH (ref 70–99)
Potassium: 3.6 mmol/L (ref 3.5–5.1)
Sodium: 142 mmol/L (ref 135–145)

## 2023-04-05 LAB — GLUCOSE, CAPILLARY: Glucose-Capillary: 165 mg/dL — ABNORMAL HIGH (ref 70–99)

## 2023-04-05 LAB — MAGNESIUM: Magnesium: 2 mg/dL (ref 1.7–2.4)

## 2023-04-05 MED ORDER — FERROUS SULFATE 325 (65 FE) MG PO TABS
325.0000 mg | ORAL_TABLET | Freq: Every day | ORAL | 0 refills | Status: DC
  Filled 2023-04-05: qty 30, 30d supply, fill #0

## 2023-04-05 MED ORDER — INSULIN GLARGINE 100 UNIT/ML ~~LOC~~ SOLN: 20.0000 [IU] | Freq: Every day | SUBCUTANEOUS | Status: DC

## 2023-04-05 MED ORDER — SPIRONOLACTONE 25 MG PO TABS
25.0000 mg | ORAL_TABLET | Freq: Every day | ORAL | 0 refills | Status: DC
  Filled 2023-04-05: qty 30, 30d supply, fill #0

## 2023-04-05 MED ORDER — DAPAGLIFLOZIN PROPANEDIOL 10 MG PO TABS
10.0000 mg | ORAL_TABLET | Freq: Every day | ORAL | 0 refills | Status: DC
  Filled 2023-04-05: qty 30, 30d supply, fill #0

## 2023-04-05 MED ORDER — INSULIN LISPRO 100 UNIT/ML IJ SOLN: 6.0000 [IU] | Freq: Two times a day (BID) | INTRAMUSCULAR | Status: DC

## 2023-04-05 MED ORDER — TORSEMIDE 20 MG PO TABS
20.0000 mg | ORAL_TABLET | Freq: Every day | ORAL | 0 refills | Status: DC
  Filled 2023-04-05: qty 30, 30d supply, fill #0

## 2023-04-05 NOTE — Plan of Care (Signed)
  Problem: Fluid Volume: Goal: Ability to maintain a balanced intake and output will improve Outcome: Progressing   Problem: Metabolic: Goal: Ability to maintain appropriate glucose levels will improve Outcome: Progressing   Problem: Education: Goal: Ability to demonstrate management of disease process will improve Outcome: Progressing Goal: Ability to verbalize understanding of medication therapies will improve Outcome: Progressing Goal: Individualized Educational Video(s) Outcome: Progressing   Problem: Activity: Goal: Capacity to carry out activities will improve Outcome: Progressing   Problem: Cardiac: Goal: Ability to achieve and maintain adequate cardiopulmonary perfusion will improve Outcome: Progressing

## 2023-04-05 NOTE — Progress Notes (Signed)
Heart Failure Stewardship Pharmacy Note  PCP: Lorrine Kin, MD PCP-Cardiologist: None  HPI: Nathan Dean is a 67 y.o. male with HFpEF, hypertension, type II diabetes, diabetic peripheral neuropathy, chronic pain syndrome, severe GERD, OSA on CPAP at bedtime, anxiety/depression, morbid obesity  who presented with hypoxia with O2 saturation in the 70s on home pulse oximetry. Recently acquired a CPAP, however reports the CPAP here does not seal correctly. Recently diagnosed with bronchitis by PCP.  BNP on admission was mildly elevated to 252.6. HS-troponin was normal. CXR on admission suggestive of pulmonary edema.  Pertinent cardiac history: Echo in 07/2019 showed LVEF of 60-65% with grade I diastolic dysfunction. Echo 10/2022 showed LVEF of 55-60% with moderate LVH, grade I diastolic dysfunction and hypokinesis of the entire inferior wall.  Pertinent Lab Values: Creatinine  Date Value Ref Range Status  06/25/2012 0.66 0.60 - 1.30 mg/dL Final   Creatinine, Ser  Date Value Ref Range Status  04/05/2023 1.10 0.61 - 1.24 mg/dL Final   BUN  Date Value Ref Range Status  04/05/2023 35 (H) 8 - 23 mg/dL Final  69/62/9528 24 8 - 27 mg/dL Final  41/32/4401 9 7 - 18 mg/dL Final   Potassium  Date Value Ref Range Status  04/05/2023 3.6 3.5 - 5.1 mmol/L Final  06/25/2012 3.8 3.5 - 5.1 mmol/L Final   Sodium  Date Value Ref Range Status  04/05/2023 142 135 - 145 mmol/L Final  07/20/2017 140 134 - 144 mmol/L Final  06/25/2012 135 (L) 136 - 145 mmol/L Final   B Natriuretic Peptide  Date Value Ref Range Status  04/01/2023 252.6 (H) 0.0 - 100.0 pg/mL Final    Comment:    Performed at [facility], [address] [place], [place] [address]   Magnesium  Date Value Ref Range Status  04/05/2023 2.0 1.7 - 2.4 mg/dL Final    Comment:    Performed at [facility], [address] [place], [place] [address]   Hgb A1c MFr Bld  Date Value Ref Range Status   04/01/2023 6.2 (H) 4.8 - 5.6 % Final    Comment:    (NOTE) Pre diabetes:          5.7%-6.4%  Diabetes:              >6.4%  Glycemic control for   <7.0% adults with diabetes    TSH  Date Value Ref Range Status  04/01/2023 0.817 0.350 - 4.500 uIU/mL Final    Comment:    Performed by a 3rd Generation assay with a functional sensitivity of <=0.01 uIU/mL. Performed at [facility], [address] [place], [place] [address]   12/23/2014 1.050 0.450 - 4.500 uIU/mL Final   Vital Signs: Temp:  [98.1 F (36.7 C)-98.9 F (37.2 C)] 98.1 F (36.7 C) (11/19 0508) Pulse Rate:  [76-80] 77 (11/19 0508) Cardiac Rhythm: Normal sinus rhythm (11/18 1938) Resp:  [16-18] 18 (11/19 0508) BP: (113-192)/(60-97) 141/61 (11/19 0508) SpO2:  [89 %-97 %] 97 % (11/19 0508) Weight:  [142.5 kg (314 lb 2.5 oz)] 142.5 kg (314 lb 2.5 oz) (11/19 0413)  Intake/Output Summary (Last 24 hours) at 04/05/2023 0729 Last data filed at 04/05/2023 0552 Gross per 24 hour  Intake 960 ml  Output 3350 ml  Net -2390 ml    Current Heart Failure Medications:  Loop diuretic: furosemide 60 gm IV BID Beta-Blocker: carvedilol 12.5 mg BID ACEI/ARB/ARNI: losartan 25 mg daily MRA: spironolactone 25 mg daily SGLT2i: Farxiga 10 mg daily  Other: none  Prior to admission Heart Failure Medications:  Loop diuretic: none Beta-Blocker: carvedilol 12.5 mg BID ACEI/ARB/ARNI: losartan 25 mg daily MRA: none SGLT2i: none  Assessment: 1. Acute on chronic diastolic heart failure (LVEF 55-60%) with grade I diastolic dysfunction, due to presumed NICM. NYHA class II-III symptoms.  -Symptoms: Denies shortness of breath and orthopnea. Reports LEE is significantly improved from admission. -Volume: Appears euvolemic, urine color is dark and LEE appears resolved. Creatinine and BUN trending up. New start Farxiga and spironolactone will help with diuresis, but recommend as needed furosemide at discharge. -Hemodynamics:  Hypertension has improved since adding spironolactone. HR is in 70s.  -SGLT2i: Farxiga 10 mg daily added.  -MRA: Hypokalemia improved with spironolactone. BP stable on 25 mg daily. -ARNI: BP stable for Entresto, however given patient is in the donut hole currently, can titrate losartan for now.   -Given that carvedilol has no benefit in HFpEF and can worsen exercise capacity, can consider decreasing in the future if BP ever becomes controlled in favor of medications that benefit HFpEF.  Plan: 1) Medication changes recommended at this time: -Consider adding furosemide 40 mg prn at discharge  2) Patient assistance: -Patient is in the coverage gap. Sherryll Burger is $161.76, Marcelline Deist is $136.90, and London Pepper is $143.69. 30 day free cards can be applied for along with clinic samples until the coverage gap is gone next year.  3) Education: - Patient has been educated on current HF medications and potential additions to HF medication regimen - Patient verbalizes understanding that over the next few months, these medication doses may change and more medications may be added to optimize HF regimen - Patient has been educated on basic disease state pathophysiology and goals of therapy  Medication Assistance / Insurance Benefits Check: Does the patient have prescription insurance?    Type of insurance plan:  Does the patient qualify for medication assistance through manufacturers or grants? Pending  Approved medication assistance renewals will be completed by: pending  Outpatient Pharmacy: Prior to admission [facility]   Is the patient willing to utilize a [facility] pharmacy at discharge?: Yes Please do not hesitate to reach out with questions or concerns,  Enos Fling, PharmD, CPP, BCPS Heart Failure Pharmacist  Phone - [phone] 04/05/2023 7:29 AM

## 2023-04-05 NOTE — TOC Transition Note (Signed)
Transition of Care [facility]) - CM/SW Discharge Note   Patient Details  Name: Nathan Dean MRN: [id number] Date of Birth: April 16, 1956  [facility] CM/SW Contact:  Darolyn Rua, LCSW Phone Number: 04/05/2023, 10:00 AM   Clinical Narrative:     Patient will discharge home today with home health services through [facility], [place] has been informed of discharge today. No additional needs, wife to transport.   Final next level of care: Home w Home Health Services Barriers to Discharge: No Barriers Identified   Patient Goals and CMS Choice CMS Medicare.gov Compare Post Acute Care list provided to:: Patient Choice offered to / list presented to : Patient  Discharge Placement                         Discharge Plan and Services Additional resources added to the After Visit Summary for                                       Social Determinants of Health (SDOH) Interventions SDOH Screenings   Food Insecurity: No Food Insecurity (11/09/2022)  Housing: Low Risk  (04/04/2023)  Transportation Needs: No Transportation Needs (04/04/2023)  Utilities: Not At Risk (11/09/2022)  Alcohol Screen: Low Risk  (04/04/2023)  Depression (PHQ2-9): High Risk (02/17/2023)  Financial Resource Strain: Low Risk  (04/04/2023)  Physical Activity: Inactive (02/26/2020)   Received from [facility], [facility]  Social Connections: Unknown (09/25/2021)   Received from [facility], [facility]  Stress: No Stress Concern Present (02/26/2020)   Received from [facility], [facility]  Tobacco Use: Medium Risk (04/01/2023)     Readmission Risk Interventions     No data to display

## 2023-04-05 NOTE — Discharge Summary (Signed)
Physician Discharge Summary   Patient: Nathan Dean MRN: [id number] DOB: 06/11/1955  Admit date:     04/01/2023  Discharge date: 04/05/23  Discharge Physician: Marrion Coy   PCP: Lorrine Kin, MD   Recommendations at discharge:   Follow-up with PCP in 1 week. Refer to cardiology to be seen in 2 weeks.  Discharge Diagnoses: Active Problems:   (HFpEF) heart failure with preserved ejection fraction (HCC)   Uncontrolled type 2 diabetes mellitus with hyperglycemia, with long-term current use of insulin (HCC)   HTN (hypertension)   Obstructive sleep apnea   Acute on chronic diastolic CHF (congestive heart failure) (HCC)   Acute respiratory failure with hypoxia (HCC)   Morbid obesity (HCC)   Hypokalemia  Resolved Problems:   * No resolved hospital problems. *  Hospital Course: Nathan Dean is a 67 y.o. male with medical history significant of chronic HFpEF, HTN, IDDM, diabetic peripheral neuropathy, chronic pain syndromes, severe GERD, OSA on CPAP at bedtime anxiety/depression, morbid obesity, brought in by family member for hypoxia.  Patient diagnosed with acute on chronic diastolic congestive heart failure, started on IV Lasix. Condition much improved, started Aldactone, Farxiga yesterday.  Continued on beta-blocker and ARB. Medically stable for discharge, patient so far has diuresed about about 13 L of IV fluids.  Edema all resolved.  Renal function still stable.  Assessment and Plan: Acute on chronic diastolic congestive heart failure. Acute respiratory failure with hypoxemia secondary to exacerbation congestive heart failure. Patient had an echocardiogram performed 11/09/2022, ejection fraction was 55 to 60% with grade 1 diastolic dysfunction. Patient to present to the hospital with short of breath and hypoxemia, grossly overloaded, with significant leg edema.  Chest x-ray showed vascular congestion.  Patient has been treated with IV Lasix, diuresing well.  Edema  all resolved, short of breath has resolved.  Currently medically stable for discharge.  Patient has been off oxygen.  Will continue beta-blocker and ARB, Started spironolactone and [place].   Hypokalemia. Resolved.   Obstruct sleep apnea. Morbid obesity with BMI 35.21 with comorbidities. Patient will be on CPAP while asleep, diet exercise advised.   Type 2 diabetes. Patient glucose not significantly elevated, reduced home dose insulin.   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   Iron deficient anemia. Hemoglobin has been stable, continue oral iron.          Consultants: None Procedures performed: None  Disposition: Home Diet recommendation:  Discharge Diet Orders (From admission, onward)     Start     Ordered   04/05/23 0000  Diet - low sodium heart healthy        04/05/23 0958           Cardiac diet DISCHARGE MEDICATION: Allergies as of 04/05/2023       Reactions   Penicillins Anaphylaxis, Rash, Other (See Comments)   Succinylcholine Chloride Anaphylaxis, Rash   Semaglutide Diarrhea   Keflex [cephalexin] Rash   Sulfa Antibiotics Rash, Other (See Comments)        Medication List     STOP taking these medications    DULoxetine 30 MG capsule Commonly known as: CYMBALTA   HYDROcodone-acetaminophen 5-325 MG tablet Commonly known as: NORCO/VICODIN   ketoconazole 2 % cream Commonly known as: NIZORAL   loratadine 10 MG tablet Commonly known as: CLARITIN   mometasone 0.1 % cream Commonly known as: ELOCON   tirzepatide 2.5 MG/0.5ML Pen Commonly known as: MOUNJARO       TAKE these medications    albuterol  108 (90 Base) MCG/ACT inhaler Commonly known as: VENTOLIN HFA Inhale 2 puffs into the lungs every 4 (four) hours as needed.   aspirin EC 81 MG tablet Take 81 mg by mouth daily.   buPROPion 150 MG 24 hr tablet Commonly known as: WELLBUTRIN XL Take 450 mg by mouth every morning.   carvedilol 12.5 MG tablet Commonly known  as: COREG Take 1 tablet (12.5 mg total) by mouth 2 (two) times daily with a meal.   cholecalciferol 25 MCG (1000 UNIT) tablet Commonly known as: VITAMIN D3 Take 2,000 Units by mouth daily.   cyanocobalamin 1000 MCG tablet Commonly known as: VITAMIN B12 Take 5,000 mcg by mouth daily.   dapagliflozin propanediol 10 MG Tabs tablet Commonly known as: FARXIGA Take 1 tablet (10 mg total) by mouth daily. Start taking on: April 06, 2023   donepezil 10 MG tablet Commonly known as: ARICEPT Take 10 mg by mouth at bedtime.   doxycycline 100 MG capsule Commonly known as: VIBRAMYCIN Take 1 capsule (100 mg total) by mouth 2 (two) times daily.   ferrous sulfate 325 (65 FE) MG tablet Take 1 tablet (325 mg total) by mouth daily with breakfast. Start taking on: April 06, 2023   gabapentin 300 MG capsule Commonly known as: NEURONTIN Take 3 capsules (900 mg total) by mouth 3 (three) times daily. What changed: when to take this   insulin glargine 100 UNIT/ML injection Commonly known as: LANTUS Inject 0.2 mLs (20 Units total) into the skin daily. What changed: how much to take   insulin lispro 100 UNIT/ML injection Commonly known as: HumaLOG Inject 0.06 mLs (6 Units total) into the skin in the morning and at bedtime. 18 in am. 8 in pm What changed: how much to take   losartan 25 MG tablet Commonly known as: COZAAR Take 25 mg by mouth daily.   magnesium oxide 400 (240 Mg) MG tablet Commonly known as: MAG-OX Take 500 mg by mouth daily.   metFORMIN 500 MG tablet Commonly known as: GLUCOPHAGE Take 1,000 mg by mouth 2 (two) times daily with a meal.   methocarbamol 750 MG tablet Commonly known as: ROBAXIN Take 750 mg by mouth 4 (four) times daily.   omeprazole 20 MG capsule Commonly known as: PRILOSEC Take 40 mg by mouth 2 times daily at 12 noon and 4 pm. What changed: Another medication with the same name was removed. Continue taking this medication, and follow the directions  you see here.   ondansetron 4 MG disintegrating tablet Commonly known as: ZOFRAN-ODT Take 8 mg by mouth every 8 (eight) hours as needed for nausea.   oxyCODONE 5 MG immediate release tablet Commonly known as: Oxy IR/ROXICODONE Take 5 mg by mouth every 4 (four) hours as needed.   rosuvastatin 20 MG tablet Commonly known as: CRESTOR Take 20 mg by mouth daily.   senna-docusate 8.6-50 MG tablet Commonly known as: Senokot-S Take 2 tablets by mouth daily.   sertraline 50 MG tablet Commonly known as: ZOLOFT 25 mg at night for one week, then 50 mg at night   spironolactone 25 MG tablet Commonly known as: ALDACTONE Take 1 tablet (25 mg total) by mouth daily. Start taking on: April 06, 2023   torsemide 20 MG tablet Commonly known as: DEMADEX Take 1 tablet (20 mg total) by mouth daily.   traZODone 50 MG tablet Commonly known as: DESYREL Take 1 tablet by mouth at bedtime.        Follow-up Information  Lorrine Kin, MD Follow up in 1 week(s).   Specialty: Family Medicine Contact information: [address] [facility] [address] [place] [address] [phone]         Kathryne Gin, MD Follow up in 2 week(s).   Specialty: Cardiology Contact information: [address] [place] [address] [phone]                Discharge Exam: Ceasar Mons Weights   04/01/23 1006 04/02/23 0758 04/05/23 0413  Weight: (!) 154.2 kg (!) 149.1 kg (!) 142.5 kg   General exam: Appears calm and comfortable  Respiratory system: Clear to auscultation. Respiratory effort normal. Cardiovascular system: S1 & S2 heard, RRR. No JVD, murmurs, rubs, gallops or clicks. No pedal edema. Gastrointestinal system: Abdomen is nondistended, soft and nontender. No organomegaly or masses felt. Normal bowel sounds heard. Central nervous system: Alert and oriented. No focal neurological deficits. Extremities: Symmetric 5 x 5 power. Skin: No rashes, lesions or  ulcers Psychiatry: Judgement and insight appear normal. Mood & affect appropriate.    Condition at discharge: good  The results of significant diagnostics from this hospitalization (including imaging, microbiology, ancillary and laboratory) are listed below for reference.   Imaging Studies: US Venous Img Lower Bilateral (DVT)  Result Date: 04/01/2023 CLINICAL DATA:  Bilateral lower extremity swelling. EXAM: Bilateral LOWER EXTREMITY VENOUS DOPPLER ULTRASOUND TECHNIQUE: Gray-scale sonography with compression, as well as color and duplex ultrasound, were performed to evaluate the deep venous system(s) from the level of the common femoral vein through the popliteal and proximal calf veins. COMPARISON:  None Available. FINDINGS: VENOUS Normal compressibility of the common femoral, superficial femoral, and popliteal veins, as well as the visualized calf veins. Visualized portions of profunda femoral vein and great saphenous vein unremarkable. No filling defects to suggest DVT on grayscale or color Doppler imaging. Doppler waveforms show normal direction of venous flow, normal respiratory plasticity and response to augmentation. Limited views of the contralateral common femoral vein are unremarkable. OTHER None. Limitations: none IMPRESSION: Negative. Electronically Signed   By: Elgie Collard M.D.   On: 04/01/2023 20:03   DG Chest 2 View  Result Date: 04/01/2023 CLINICAL DATA:  Shortness of breath. EXAM: CHEST - 2 VIEW COMPARISON:  Chest radiograph dated March 31, 2023 FINDINGS: The heart size and mediastinal contours are unchanged. Diffuse bilateral basilar predominant interstitial prominence. No focal consolidation. Similar small bilateral pleural effusions. No pneumothorax. Shunt tubing extending over the right chest. IMPRESSION: 1. Diffuse bilateral interstitial prominence appears similar to slightly decreased compared to the prior exam and may reflect pulmonary edema. 2. Similar small bilateral  pleural effusions. Electronically Signed   By: Hart Robinsons M.D.   On: 04/01/2023 13:37   DG Chest 2 View  Result Date: 03/31/2023 CLINICAL DATA:  Cough, shortness of breath and congestion for 2 weeks. Symptoms began after getting a new CPAP machine. EXAM: CHEST - 2 VIEW COMPARISON:  One-view chest x-ray 11/09/2022.  CT chest 11/09/2022. FINDINGS: The heart size is normal. Diffuse interstitial coarsening is increased slightly. Areas of scarring previously seen are again noted. No new focal airspace disease is present. The shunt tubing extends over the chest. Small bilateral effusions are present. IMPRESSION: 1. Slight increase in diffuse interstitial coarsening and small bilateral effusions. This may reflect pulmonary edema. 2. No new focal airspace disease. Electronically Signed   By: Marin Roberts M.D.   On: 03/31/2023 16:49    Microbiology: Results for orders placed or performed during the hospital encounter of  10/18/22  Resp panel by RT-PCR (RSV, Flu A&B, Covid) Anterior Nasal Swab     Status: None   Collection Time: 10/18/22 12:55 PM   Specimen: Anterior Nasal Swab  Result Value Ref Range Status   SARS Coronavirus 2 by RT PCR NEGATIVE NEGATIVE Final    Comment: (NOTE) SARS-CoV-2 target nucleic acids are NOT DETECTED.  The SARS-CoV-2 RNA is generally detectable in upper respiratory specimens during the acute phase of infection. The lowest concentration of SARS-CoV-2 viral copies this assay can detect is 138 copies/mL. A negative result does not preclude SARS-Cov-2 infection and should not be used as the sole basis for treatment or other patient management decisions. A negative result may occur with  improper specimen collection/handling, submission of specimen other than nasopharyngeal swab, presence of viral mutation(s) within the areas targeted by this assay, and inadequate number of viral copies(<138 copies/mL). A negative result must be combined with clinical  observations, patient history, and epidemiological information. The expected result is Negative.  Fact Sheet for Patients:  BloggerCourse.com  Fact Sheet for Healthcare Providers:  SeriousBroker.it  This test is no t yet approved or cleared by the [place] FDA and  has been authorized for detection and/or diagnosis of SARS-CoV-2 by FDA under an Emergency Use Authorization (EUA). This EUA will remain  in effect (meaning this test can be used) for the duration of the COVID-19 declaration under Section 564(b)(1) of the Act, 21 U.S.C.section 360bbb-3(b)(1), unless the authorization is terminated  or revoked sooner.       Influenza A by PCR NEGATIVE NEGATIVE Final   Influenza B by PCR NEGATIVE NEGATIVE Final    Comment: (NOTE) The Xpert Xpress SARS-CoV-2/FLU/RSV plus assay is intended as an aid in the diagnosis of influenza from Nasopharyngeal swab specimens and should not be used as a sole basis for treatment. Nasal washings and aspirates are unacceptable for Xpert Xpress SARS-CoV-2/FLU/RSV testing.  Fact Sheet for Patients: BloggerCourse.com  Fact Sheet for Healthcare Providers: SeriousBroker.it  This test is not yet approved or cleared by the [place] FDA and has been authorized for detection and/or diagnosis of SARS-CoV-2 by FDA under an Emergency Use Authorization (EUA). This EUA will remain in effect (meaning this test can be used) for the duration of the COVID-19 declaration under Section 564(b)(1) of the Act, 21 U.S.C. section 360bbb-3(b)(1), unless the authorization is terminated or revoked.     Resp Syncytial Virus by PCR NEGATIVE NEGATIVE Final    Comment: (NOTE) Fact Sheet for Patients: BloggerCourse.com  Fact Sheet for Healthcare Providers: SeriousBroker.it  This test is not yet approved or cleared by  the [place] FDA and has been authorized for detection and/or diagnosis of SARS-CoV-2 by FDA under an Emergency Use Authorization (EUA). This EUA will remain in effect (meaning this test can be used) for the duration of the COVID-19 declaration under Section 564(b)(1) of the Act, 21 U.S.C. section 360bbb-3(b)(1), unless the authorization is terminated or revoked.  Performed at [facility], [address] [place], [place] [address]     Labs: CBC: Recent Labs  Lab 04/01/23 1016 04/03/23 0419 04/04/23 0556  WBC 5.6 6.0 6.3  HGB 9.8* 10.0* 11.8*  HCT 33.6* 33.5* 39.4  MCV 90.8 88.9 87.9  PLT 186 188 213   Basic Metabolic Panel: Recent Labs  Lab 04/01/23 1016 04/02/23 0557 04/03/23 0419 04/04/23 0556 04/05/23 0606  NA 139 142 139 141 142  K 4.0 3.5 3.2* 3.2* 3.6  CL 105 101 95* 96* 96*  CO2 25 31 34* 33* 32  GLUCOSE 110* 132* 140* 152* 151*  BUN 17 19 23  26* 35*  CREATININE 0.77 0.90 0.86 0.80 1.10  CALCIUM 8.9 8.5* 8.5* 9.3 9.4  MG  --   --  1.7 2.0 2.0   Liver Function Tests: Recent Labs  Lab 04/01/23 1016  AST 27  ALT 13  ALKPHOS 55  BILITOT 0.4  PROT 6.3*  ALBUMIN 3.6   CBG: Recent Labs  Lab 04/04/23 0828 04/04/23 1154 04/04/23 1613 04/04/23 2110 04/05/23 0753  GLUCAP 153* 186* 145* 168* 165*    Discharge time spent: greater than 30 minutes.  Signed: Marrion Coy, MD [facility] 04/05/2023

## 2023-04-07 NOTE — Progress Notes (Signed)
Virtual Visit via Video Note  I connected with Nathan Dean on 04/11/23 at  1:30 PM EST by a video enabled telemedicine application and verified that I am speaking with the correct person using two identifiers.  Location: Patient: home Provider: office Persons participated in the visit- patient, provider    I discussed the limitations of evaluation and management by telemedicine and the availability of in person appointments. The patient expressed understanding and agreed to proceed.   I discussed the assessment and treatment plan with the patient. The patient was provided an opportunity to ask questions and all were answered. The patient agreed with the plan and demonstrated an understanding of the instructions.   The patient was advised to call back or seek an in-person evaluation if the symptoms worsen or if the condition fails to improve as anticipated.  I provided 15 minutes of non-face-to-face time during this encounter.   Nathan Hotter, MD    [facility] MD/PA/NP OP Progress Note  04/11/2023 2:04 PM Nathan Dean  MRN:  [id number]  Chief Complaint:  Chief Complaint  Patient presents with   Follow-up   HPI:  According to the chart review, the following events have occurred since the last visit: The patient was admitted for hypoxia in the setting of HFpEF,   This is a follow-up appointment for depression.  He states that he feels weak.  When he was asked about recent admission, he states that he was admitted due to heart failure.  He lost 30 pounds since being on diuretics.  He does not believe in his condition.  He feels fatigue.  He is unsure whether there has been change in his mood.  He has hypersomnia.  He has decrease in appetite.  He denies SI.  He denies anxiety. He will be seen by a cardiologist later today.  Nathan Dean, his wife presents to the visit.  She states that she has seen a definite change for the positive.  She thinks he handles this situation much better.   He is much less irritable, and getting more positive, although the current situation is challenging.  He was able to sit out in the porch, playing with a dog, prior to the admission.    Visit Diagnosis:    ICD-10-CM   1. Major depressive disorder, recurrent episode, moderate (HCC)  F33.1       Past Psychiatric History: Please see initial evaluation for full details. I have reviewed the history. No updates at this time.     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    Diabetes (HCC)    GERD (gastroesophageal reflux disease)    Hyperlipidemia    Hypertension    Obesity    Sleep apnea     Past Surgical History:  Procedure Laterality Date   BACK SURGERY     in the 80's -herniated disc   BACK SURGERY     L4 bone spur 1990   CARPAL TUNNEL RELEASE Right 12/28/2016   Procedure: RIGHT ULNAR AND MEDIAN NEUROPLASTY AT WRIST;  Surgeon: Mack Hook, MD;  Location: [facility];  Service: Orthopedics;  Laterality: Right;   CARPAL TUNNEL RELEASE Left    25 years ago   CATARACT EXTRACTION W/ INTRAOCULAR LENS IMPLANT Bilateral    REPLACEMENT TOTAL KNEE BILATERAL  2014   left 2012 rt 2014   SHOULDER OPEN ROTATOR CUFF REPAIR Right 08/07/2019   Procedure: ROTATOR CUFF REPAIR  SHOULDER OPEN;  Surgeon: Juanell Fairly, MD;  Location: [facility];  Service: Orthopedics;  Laterality: Right;   SKIN GRAFT Left 06/19/2022   left side of neck for skin cancer on top of head   TONSILLECTOMY      Family Psychiatric History: Please see initial evaluation for full details. I have reviewed the history. No updates at this time.     Family History:  Family History  Problem Relation Age of Onset   Ovarian cancer Mother    Kidney failure Father    Cancer Brother     Social History:  Social History   Socioeconomic History   Marital status: Married    Spouse name: Nathan Dean   Number of  children: 0   Years of education: Not on file   Highest education level: Associate degree: academic program  Occupational History   Occupation: Retired  Tobacco Use   Smoking status: Former    Current packs/day: 0.00    Average packs/day: 2.0 packs/day for 20.0 years (40.0 ttl pk-yrs)    Types: Cigarettes    Start date: 06/28/1976    Quit date: 06/28/1996    Years since quitting: 26.8   Smokeless tobacco: Never  Vaping Use   Vaping status: Never Used  Substance and Sexual Activity   Alcohol use: Yes    Comment: occasionally   Drug use: No   Sexual activity: Not on file    Comment: not asked if sexually active  Other Topics Concern   Not on file  Social History Narrative   Right handed    Lives at home spouse    Caffeine 5 -6 cups     Social Determinants of Health   Financial Resource Strain: Low Risk  (04/04/2023)   Overall Financial Resource Strain (CARDIA)    Difficulty of Paying Living Expenses: Not hard at all  Food Insecurity: No Food Insecurity (11/09/2022)   Hunger Vital Sign    Worried About Running Out of Food in the Last Year: Never true    Ran Out of Food in the Last Year: Never true  Transportation Needs: No Transportation Needs (04/04/2023)   PRAPARE - Administrator, Civil Service (Medical): No    Lack of Transportation (Non-Medical): No  Physical Activity: Inactive (02/26/2020)   [facility], [facility]   Exercise Vital Sign    Days of Exercise per Week: 0 days    Minutes of Exercise per Session: 0 min  Stress: No Stress Concern Present (02/26/2020)   Received from [facility], [facility]   [employer] of Occupational Health - Occupational Stress Questionnaire    Feeling of Stress : Not at all  Social Connections: Unknown (09/25/2021)   [facility] from [facility], [facility]   Social Network    Social Network: Not on file    Allergies:   Allergies  Allergen Reactions   Penicillins Anaphylaxis, Rash and Other (See Comments)   Succinylcholine Chloride Anaphylaxis and Rash   Semaglutide Diarrhea   Keflex [Cephalexin] Rash   Sulfa Antibiotics Rash and Other (See Comments)    Metabolic Disorder Labs: Lab Results  Component Value Date   HGBA1C 6.2 (H) 04/01/2023   MPG 131.24 04/01/2023   MPG 169 05/23/2022   No results found for: "PROLACTIN" Lab Results  Component Value Date   CHOL 199 11/04/2016   TRIG 405 (H) 11/04/2016   HDL 48 11/04/2016   CHOLHDL 4.1 11/04/2016  LDLCALC Comment 11/04/2016   Lab Results  Component Value Date   TSH 0.817 04/01/2023   TSH 1.062 08/16/2019    Therapeutic Level Labs: No results found for: "LITHIUM" No results found for: "VALPROATE" No results found for: "CBMZ"  Current Medications: Current Outpatient Medications  Medication Sig Dispense Refill   albuterol (VENTOLIN HFA) 108 (90 Base) MCG/ACT inhaler Inhale 2 puffs into the lungs every 4 (four) hours as needed.     aspirin EC 81 MG tablet Take 81 mg by mouth daily.     buPROPion (WELLBUTRIN XL) 150 MG 24 hr tablet Take 450 mg by mouth every morning.     carvedilol (COREG) 12.5 MG tablet Take 1 tablet (12.5 mg total) by mouth 2 (two) times daily with a meal.     cholecalciferol (VITAMIN D3) 25 MCG (1000 UNIT) tablet Take 2,000 Units by mouth daily.     cyanocobalamin (VITAMIN B12) 1000 MCG tablet Take 5,000 mcg by mouth daily.     dapagliflozin propanediol (FARXIGA) 10 MG TABS tablet Take 1 tablet (10 mg total) by mouth daily. 30 tablet 0   donepezil (ARICEPT) 10 MG tablet Take 10 mg by mouth at bedtime.     doxycycline (VIBRAMYCIN) 100 MG capsule Take 1 capsule (100 mg total) by mouth 2 (two) times daily. 20 capsule 0   ferrous sulfate 325 (65 FE) MG tablet Take 1 tablet (325 mg total) by mouth daily with breakfast. 30 tablet 0   gabapentin (NEURONTIN) 300 MG capsule Take 3 capsules (900 mg total) by mouth 3 (three) times  daily. (Patient taking differently: Take 900 mg by mouth 2 (two) times daily.)     insulin glargine (LANTUS) 100 UNIT/ML injection Inject 0.2 mLs (20 Units total) into the skin daily.     insulin lispro (HUMALOG) 100 UNIT/ML injection Inject 0.06 mLs (6 Units total) into the skin in the morning and at bedtime. 18 in am. 8 in pm     losartan (COZAAR) 25 MG tablet Take 25 mg by mouth daily.     magnesium oxide (MAG-OX) 400 (240 Mg) MG tablet Take 500 mg by mouth daily.     metFORMIN (GLUCOPHAGE) 500 MG tablet Take 1,000 mg by mouth 2 (two) times daily with a meal.     methocarbamol (ROBAXIN) 750 MG tablet Take 750 mg by mouth 4 (four) times daily.     omeprazole (PRILOSEC) 20 MG capsule Take 40 mg by mouth 2 times daily at 12 noon and 4 pm.     ondansetron (ZOFRAN-ODT) 4 MG disintegrating tablet Take 8 mg by mouth every 8 (eight) hours as needed for nausea.     oxyCODONE (OXY IR/ROXICODONE) 5 MG immediate release tablet Take 5 mg by mouth every 4 (four) hours as needed.     rosuvastatin (CRESTOR) 20 MG tablet Take 20 mg by mouth daily.     senna-docusate (SENOKOT-S) 8.6-50 MG tablet Take 2 tablets by mouth daily. (Patient not taking: Reported on 04/01/2023)     [START ON 05/18/2023] sertraline (ZOLOFT) 50 MG tablet Take 1 tablet (50 mg total) by mouth at bedtime. 90 tablet 0   spironolactone (ALDACTONE) 25 MG tablet Take 1 tablet (25 mg total) by mouth daily. 30 tablet 0   torsemide (DEMADEX) 20 MG tablet Take 1 tablet (20 mg total) by mouth daily. 30 tablet 0   traZODone (DESYREL) 50 MG tablet Take 1 tablet by mouth at bedtime.     No current facility-administered medications for this visit.  Musculoskeletal: Strength & Muscle Tone:  N/A Gait & Station:  N/A Patient leans: N/A  Psychiatric Specialty Exam: Review of Systems  Psychiatric/Behavioral:  Positive for dysphoric mood and sleep disturbance. Negative for agitation, behavioral problems, confusion, decreased concentration,  hallucinations, self-injury and suicidal ideas. The patient is not nervous/anxious and is not hyperactive.   All other systems reviewed and are negative.   There were no vitals taken for this visit.There is no height or weight on file to calculate BMI.  General Appearance: Well Groomed  Eye Contact:  Good  Speech:  Clear and Coherent  Volume:  Normal  Mood:   tired  Affect:  Appropriate, Congruent, and fatigue  Thought Process:  Coherent  Orientation:  Full (Time, Place, and Person)  Thought Content: Logical   Suicidal Thoughts:  No  Homicidal Thoughts:  No  Memory:  Immediate;   Good  Judgement:  Good  Insight:  Good  Psychomotor Activity:  Normal  Concentration:  Concentration: Good and Attention Span: Good  Recall:  Good  Fund of Knowledge: Good  Language: Good  Akathisia:  No  Handed:  Right  AIMS (if indicated): not done  Assets:  Communication Skills Desire for Improvement  ADL's:  Intact  Cognition: WNL  Sleep:   hypersomnia   Screenings: GAD-7    Flowsheet Row Office Visit from 02/17/2023 in [facility] Office Visit from 01/13/2023 in [facility] Office Visit from 09/28/2022 in [facility]  Total GAD-7 Score 9 11 12       PHQ2-9    Flowsheet Row Office Visit from 02/17/2023 in [facility] Office Visit from 01/13/2023 in [facility] Office Visit from 09/28/2022 in [facility] Procedure visit from 05/12/2022 in [facility] Procedure visit from 09/16/2021 in [facility] Total Score 6 6 5  0 0  PHQ-9 Total Score 16 24 21  -- --      Flowsheet Row ED to Hosp-Admission (Discharged) from 04/01/2023 in  [facility] from 03/31/2023 in [facility]  ED to Hosp-Admission (Discharged) from 11/09/2022 in [facility] (1A)  C-SSRS RISK CATEGORY No Risk No Risk No Risk        Assessment and Plan:  Kensen Tierrablanca is a 67 y.o. year old male with a history of depression, memory concern with hydrocephalus s/p VP shunt placement + history of repeated head trauma in setting of falls, diabetic peripheral neuropathy, hypertension, OSA on CPAP,  who is referred for depression.   1. Major depressive disorder, recurrent episode, moderate (HCC) Acute stressors include: s/p vertebral fracture, s/p admission due to HFpEF Other stressors include: unsteady gait secondary to hydrocephalus s/p shunt placement, diabetic neuropathy, losses of his close parents, brother several years ago, unemployment    History:  no prior history of depression prior to hydrocephalus    Exam is notable for fatigue s/p recent admission.  Although he denies any significant change in his mood since starting sertraline, his wife notes a significant positive difference since he began the medication.  He may benefit from further uptitration, will stay on the current dose for now, as he may undergo  intervention for his physical condition.  Will continue current dose of bupropion for depression.   # cognitive impairment -  on donepezil, prescribed by neurology - hydrocephalus status post VP shunt placement + history of repeated head trauma in setting of fall - neuropsych testing in May 2022- mild neurocognitive disorder, adjustment disorder. Per note, "your mild neurocognitive disorder is most Unchanged. likely related to your history of normal pressure hydrocephalus and shunting."  It is multifactorial, and he does have mood symptoms as above.  Will continue to assess.    Plan Continue sertraline 50 mg at night  Continue bupropion 450 mg daily  Next appointment:  1/22 at 10:30, video - on monjaro (not taking it lately) - on trazodone, gabapentin 900 mg tid - TSH, fT4 wnl 12/2022   Past trials: fluoxetine (diarrhea, nausea), duloxetine (diarrhea, nausea)   The patient demonstrates the following risk factors for suicide: Chronic risk factors for suicide include: psychiatric disorder of depression and chronic pain. Acute risk factors for suicide include: unemployment and loss (financial, interpersonal, professional). Protective factors for this patient include: positive social support, coping skills, and hope for the future. Considering these factors, the overall suicide risk at this point appears to be low. Patient is appropriate for outpatient follow up.    Collaboration of Care: Collaboration of Care: Other reviewed notes in Epic  Patient/Guardian was advised Release of Information must be obtained prior to any record release in order to collaborate their care with an outside provider. Patient/Guardian was advised if they have not already done so to contact the registration department to sign all necessary forms in order for [name] to release information regarding their care.   Consent: Patient/Guardian gives verbal consent for treatment and assignment of benefits for services provided during this visit. Patient/Guardian expressed understanding and agreed to proceed.    Nathan Hotter, MD 04/11/2023, 2:04 PM

## 2023-04-08 NOTE — Progress Notes (Unsigned)
Advanced Heart Failure Clinic Note   Referring Physician: recent admission PCP: Lorrine Kin, MD (last seen 11/24) Cardiologist: None   HPI:  Nathan Dean is a 67 y/o male with a history of T2DM, sleep apnea (CPAP), obesity, chronic pain, GERD, HTN, anxiety, depression, hydrocephalus with VP shunt, iron deficiency anemia, L2 fracture, renal cell cancer and chronic heart failure.   Admitted 05/23/22 due to increased shortness of breath for preceding 2 weeks. Found to by hypoxic. Oxygen saturation was 79% on room air. Patient found to have influenza A. Treated with bronchodilators as well as Tamiflu. Weaned off of oxygen. CT showed A 2.8 cm mass in the lateral midpole of the right kidney measures slightly above simple fluid attenuation and contains a single 2 mm calcification.  Admitted 11/09/22 due to a fall trying to get out of bed. Denied hitting his head. CT C-spine, CT chest, CT L-spine grossly stable apart from noted L2 vertebral fracture. Dr. Marcell Barlow with neurosurgery recommends PT/OT, use LSO brace. IV dilaudid given with subsequent drop in blood pressure. IVF given. PT/OT evaluation done.   Admitted 04/01/23 due to shortness of breath, hypoxia and pedal edema. Given IV lasix and diuresed ~ 13L. Echocardiogram performed 11/09/2022, ejection fraction was 55 to 60% with grade 1 diastolic dysfunction. Chest x-ray showed vascular congestion. Continue beta-blocker and ARB. Started spironolactone and [place].  Echo 10/22/18: EF 60-65% with trivial AR Echo 08/12/19: EF 60-65% with mild LVH, Grade I DD, mildly elevated PA pressure Echo 11/09/22: EF 55-60% with moderate LVH, trivial Nathan  He presents today for his initial visit with a chief complaint of    Review of Systems: [y] = yes, [ ]  = no   General: Weight gain [ ] ; Weight loss [ ] ; Anorexia [ ] ; Fatigue [ ] ; Fever [ ] ; Chills [ ] ; Weakness [ ]   Cardiac: Chest pain/pressure [ ] ; Resting SOB [ ] ; Exertional SOB [ ] ; Orthopnea [ ] ;  Pedal Edema [ ] ; Palpitations [ ] ; Syncope [ ] ; Presyncope [ ] ; Paroxysmal nocturnal dyspnea[ ]   Pulmonary: Cough [ ] ; Wheezing[ ] ; Hemoptysis[ ] ; Sputum [ ] ; Snoring [ ]   GI: Vomiting[ ] ; Dysphagia[ ] ; Melena[ ] ; Hematochezia [ ] ; Heartburn[ ] ; Abdominal pain [ ] ; Constipation [ ] ; Diarrhea [ ] ; BRBPR [ ]   GU: Hematuria[ ] ; Dysuria [ ] ; Nocturia[ ]   Vascular: Pain in legs with walking [ ] ; Pain in feet with lying flat [ ] ; Non-healing sores [ ] ; Stroke [ ] ; TIA [ ] ; Slurred speech [ ] ;  Neuro: Headaches[ ] ; Vertigo[ ] ; Seizures[ ] ; Paresthesias[ ] ;Blurred vision [ ] ; Diplopia [ ] ; Vision changes [ ]   Ortho/Skin: Arthritis [ ] ; Joint pain [ ] ; Muscle pain [ ] ; Joint swelling [ ] ; Back Pain [ ] ; Rash [ ]   Psych: Depression[ ] ; Anxiety[ ]   Heme: Bleeding problems [ ] ; Clotting disorders [ ] ; Anemia [ ]   Endocrine: Diabetes [ ] ; Thyroid dysfunction[ ]    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    Diabetes (HCC)    GERD (gastroesophageal reflux disease)    Hyperlipidemia    Hypertension    Obesity    Sleep apnea     Current Outpatient Medications  Medication Sig Dispense Refill   albuterol (VENTOLIN HFA) 108 (90 Base) MCG/ACT inhaler Inhale 2 puffs into the lungs every 4 (four) hours as needed.     aspirin EC 81 MG tablet Take 81 mg by mouth daily.  buPROPion (WELLBUTRIN XL) 150 MG 24 hr tablet Take 450 mg by mouth every morning.     carvedilol (COREG) 12.5 MG tablet Take 1 tablet (12.5 mg total) by mouth 2 (two) times daily with a meal.     cholecalciferol (VITAMIN D3) 25 MCG (1000 UNIT) tablet Take 2,000 Units by mouth daily.     cyanocobalamin (VITAMIN B12) 1000 MCG tablet Take 5,000 mcg by mouth daily.     dapagliflozin propanediol (FARXIGA) 10 MG TABS tablet Take 1 tablet (10 mg total) by mouth daily. 30 tablet 0   donepezil (ARICEPT) 10 MG tablet Take 10 mg by mouth at bedtime.     doxycycline (VIBRAMYCIN) 100  MG capsule Take 1 capsule (100 mg total) by mouth 2 (two) times daily. 20 capsule 0   ferrous sulfate 325 (65 FE) MG tablet Take 1 tablet (325 mg total) by mouth daily with breakfast. 30 tablet 0   gabapentin (NEURONTIN) 300 MG capsule Take 3 capsules (900 mg total) by mouth 3 (three) times daily. (Patient taking differently: Take 900 mg by mouth 2 (two) times daily.)     insulin glargine (LANTUS) 100 UNIT/ML injection Inject 0.2 mLs (20 Units total) into the skin daily.     insulin lispro (HUMALOG) 100 UNIT/ML injection Inject 0.06 mLs (6 Units total) into the skin in the morning and at bedtime. 18 in am. 8 in pm     losartan (COZAAR) 25 MG tablet Take 25 mg by mouth daily.     magnesium oxide (MAG-OX) 400 (240 Mg) MG tablet Take 500 mg by mouth daily.     metFORMIN (GLUCOPHAGE) 500 MG tablet Take 1,000 mg by mouth 2 (two) times daily with a meal.     methocarbamol (ROBAXIN) 750 MG tablet Take 750 mg by mouth 4 (four) times daily.     omeprazole (PRILOSEC) 20 MG capsule Take 40 mg by mouth 2 times daily at 12 noon and 4 pm.     ondansetron (ZOFRAN-ODT) 4 MG disintegrating tablet Take 8 mg by mouth every 8 (eight) hours as needed for nausea.     oxyCODONE (OXY IR/ROXICODONE) 5 MG immediate release tablet Take 5 mg by mouth every 4 (four) hours as needed.     rosuvastatin (CRESTOR) 20 MG tablet Take 20 mg by mouth daily.     senna-docusate (SENOKOT-S) 8.6-50 MG tablet Take 2 tablets by mouth daily. (Patient not taking: Reported on 04/01/2023)     sertraline (ZOLOFT) 50 MG tablet 25 mg at night for one week, then 50 mg at night 90 tablet 0   spironolactone (ALDACTONE) 25 MG tablet Take 1 tablet (25 mg total) by mouth daily. 30 tablet 0   torsemide (DEMADEX) 20 MG tablet Take 1 tablet (20 mg total) by mouth daily. 30 tablet 0   traZODone (DESYREL) 50 MG tablet Take 1 tablet by mouth at bedtime.     No current facility-administered medications for this visit.    Allergies  Allergen Reactions    Penicillins Anaphylaxis, Rash and Other (See Comments)   Succinylcholine Chloride Anaphylaxis and Rash   Semaglutide Diarrhea   Keflex [Cephalexin] Rash   Sulfa Antibiotics Rash and Other (See Comments)      Social History   Socioeconomic History   Marital status: Married    Spouse name: Britta Mccreedy   Number of children: 0   Years of education: Not on file   Highest education level: Associate degree: academic program  Occupational History   Occupation: Retired  Tobacco  Use   Smoking status: Former    Current packs/day: 0.00    Average packs/day: 2.0 packs/day for 20.0 years (40.0 ttl pk-yrs)    Types: Cigarettes    Start date: 06/28/1976    Quit date: 06/28/1996    Years since quitting: 26.7   Smokeless tobacco: Never  Vaping Use   Vaping status: Never Used  Substance and Sexual Activity   Alcohol use: Yes    Comment: occasionally   Drug use: No   Sexual activity: Not on file    Comment: not asked if sexually active  Other Topics Concern   Not on file  Social History Narrative   Right handed    Lives at home spouse    Caffeine 5 -6 cups     Social Determinants of Health   Financial Resource Strain: Low Risk  (04/04/2023)   Overall Financial Resource Strain (CARDIA)    Difficulty of Paying Living Expenses: Not hard at all  Food Insecurity: No Food Insecurity (11/09/2022)   Hunger Vital Sign    Worried About Running Out of Food in the Last Year: Never true    Ran Out of Food in the Last Year: Never true  Transportation Needs: No Transportation Needs (04/04/2023)   PRAPARE - Administrator, Civil Service (Medical): No    Lack of Transportation (Non-Medical): No  Physical Activity: Inactive (02/26/2020)   Received from [facility], [facility]   Exercise Vital Sign    Days of Exercise per Week: 0 days    Minutes of Exercise per Session: 0 min  Stress: No Stress Concern Present (02/26/2020)   Received from [facility], [facility]   [facility] - Occupational Stress Questionnaire    Feeling of Stress : Not at all  Social Connections: Unknown (09/25/2021)   Received from [facility], [facility]   Social Network    Social Network: Not on file  Intimate Partner Violence: Not At Risk (11/09/2022)   Humiliation, Afraid, Rape, and Kick questionnaire    Fear of Current or Ex-Partner: No    Emotionally Abused: No    Physically Abused: No    Sexually Abused: No      Family History  Problem Relation Age of Onset   Ovarian cancer Mother    Kidney failure Father    Cancer Brother        PHYSICAL EXAM: General:  Well appearing. No respiratory difficulty HEENT: normal Neck: supple. no JVD. No lymphadenopathy or thyromegaly appreciated. Cor: PMI nondisplaced. Regular rate & rhythm. No rubs, gallops or murmurs. Lungs: clear Abdomen: soft, nontender, nondistended. No hepatosplenomegaly. No bruits or masses.  Extremities: no cyanosis, clubbing, rash, edema Neuro: alert & oriented x 3, cranial nerves grossly intact. moves all 4 extremities w/o difficulty. Affect pleasant.  ECG:   ASSESSMENT & PLAN:  1: Chronic heart failure with preserved ejection fraction- - suspect due to - NYHA class - euvolemic - weighing daily - Echo 10/22/18: EF 60-65% with trivial AR - Echo 08/12/19: EF 60-65% with mild LVH, Grade I DD, mildly elevated PA pressure - Echo 11/09/22: EF 55-60% with moderate LVH, trivial Nathan - continue  - BNP 04/01/23 was 252.6  2: HTN- - BP - saw PCP Katrinka Blazing) 11/24 - BMP 04/05/23 showed sodium 142, potassium 3.6, creatinine 1.10 & GFR >60  3: DM- - saw endocrinology ([place]) 09/24 - A1c 04/01/23 was 6.2%  4: OSA- - wearing  CPAP  5: Depression- - saw psychiatry ([place]) 10/24   Delma Freeze, FNP 04/08/23

## 2023-04-11 ENCOUNTER — Encounter: Payer: Self-pay | Admitting: Psychiatry

## 2023-04-11 ENCOUNTER — Encounter: Payer: Self-pay | Admitting: Family

## 2023-04-11 ENCOUNTER — Ambulatory Visit: Payer: Medicare HMO | Attending: Family | Admitting: Family

## 2023-04-11 ENCOUNTER — Telehealth: Payer: Medicare HMO | Admitting: Psychiatry

## 2023-04-11 VITALS — BP 116/67 | HR 77 | Wt 307.0 lb

## 2023-04-11 DIAGNOSIS — G8929 Other chronic pain: Secondary | ICD-10-CM | POA: Insufficient documentation

## 2023-04-11 DIAGNOSIS — F419 Anxiety disorder, unspecified: Secondary | ICD-10-CM | POA: Insufficient documentation

## 2023-04-11 DIAGNOSIS — F331 Major depressive disorder, recurrent, moderate: Secondary | ICD-10-CM

## 2023-04-11 DIAGNOSIS — G4733 Obstructive sleep apnea (adult) (pediatric): Secondary | ICD-10-CM | POA: Diagnosis not present

## 2023-04-11 DIAGNOSIS — E119 Type 2 diabetes mellitus without complications: Secondary | ICD-10-CM | POA: Diagnosis not present

## 2023-04-11 DIAGNOSIS — Z794 Long term (current) use of insulin: Secondary | ICD-10-CM

## 2023-04-11 DIAGNOSIS — Z982 Presence of cerebrospinal fluid drainage device: Secondary | ICD-10-CM | POA: Diagnosis not present

## 2023-04-11 DIAGNOSIS — R42 Dizziness and giddiness: Secondary | ICD-10-CM | POA: Insufficient documentation

## 2023-04-11 DIAGNOSIS — F329 Major depressive disorder, single episode, unspecified: Secondary | ICD-10-CM

## 2023-04-11 DIAGNOSIS — E669 Obesity, unspecified: Secondary | ICD-10-CM | POA: Diagnosis not present

## 2023-04-11 DIAGNOSIS — F32A Depression, unspecified: Secondary | ICD-10-CM | POA: Diagnosis not present

## 2023-04-11 DIAGNOSIS — I5032 Chronic diastolic (congestive) heart failure: Secondary | ICD-10-CM | POA: Diagnosis not present

## 2023-04-11 DIAGNOSIS — Z79899 Other long term (current) drug therapy: Secondary | ICD-10-CM | POA: Diagnosis not present

## 2023-04-11 DIAGNOSIS — Z7984 Long term (current) use of oral hypoglycemic drugs: Secondary | ICD-10-CM | POA: Insufficient documentation

## 2023-04-11 DIAGNOSIS — R5383 Other fatigue: Secondary | ICD-10-CM | POA: Diagnosis present

## 2023-04-11 DIAGNOSIS — R238 Other skin changes: Secondary | ICD-10-CM | POA: Diagnosis not present

## 2023-04-11 DIAGNOSIS — I110 Hypertensive heart disease with heart failure: Secondary | ICD-10-CM | POA: Diagnosis not present

## 2023-04-11 DIAGNOSIS — I10 Essential (primary) hypertension: Secondary | ICD-10-CM

## 2023-04-11 DIAGNOSIS — D509 Iron deficiency anemia, unspecified: Secondary | ICD-10-CM | POA: Diagnosis not present

## 2023-04-11 DIAGNOSIS — K219 Gastro-esophageal reflux disease without esophagitis: Secondary | ICD-10-CM | POA: Diagnosis not present

## 2023-04-11 MED ORDER — SERTRALINE HCL 50 MG PO TABS: 50.0000 mg | ORAL_TABLET | Freq: Every day | ORAL | 0 refills | Status: DC

## 2023-04-11 NOTE — Patient Instructions (Signed)
Continue sertraline 50 mg at night  Continue bupropion 450 mg daily  Next appointment: 1/22 at 10:30

## 2023-04-11 NOTE — Patient Instructions (Signed)
START TAKING 1/2 TAB TWICE DAILY OF CARVEDILOL

## 2023-04-12 ENCOUNTER — Other Ambulatory Visit: Payer: Self-pay

## 2023-04-12 DIAGNOSIS — R221 Localized swelling, mass and lump, neck: Secondary | ICD-10-CM | POA: Insufficient documentation

## 2023-04-12 DIAGNOSIS — L089 Local infection of the skin and subcutaneous tissue, unspecified: Secondary | ICD-10-CM | POA: Insufficient documentation

## 2023-04-12 HISTORY — DX: Local infection of the skin and subcutaneous tissue, unspecified: L08.9

## 2023-04-12 HISTORY — DX: Localized swelling, mass and lump, neck: R22.1

## 2023-04-13 ENCOUNTER — Encounter: Payer: Self-pay | Admitting: Gastroenterology

## 2023-04-17 NOTE — Progress Notes (Unsigned)
Celso Amy, PA-C [address]  [address]  [place], [place] [address]  Main: [phone]  Fax: [phone]   Gastroenterology Consultation  Referring Provider:     Lorrine Kin Primary Care Physician:  Lorrine Kin, MD Primary Gastroenterologist:  *** Reason for Consultation:     Nausea, diarrhea, upset stomach        HPI:   Makhari Postiglione is a 67 y.o. y/o male referred for consultation & management  by Lorrine Kin, MD.  ***  Past Medical History:  Diagnosis Date   Abnormal MRI of abdomen 10/08/2020   At risk for falls 07/19/2018   Atopic dermatitis 07/08/2008   Atopic Dermatitis     Basal cell carcinoma of scalp 03/31/2022   Bilateral carpal tunnel syndrome 11/02/2016   Cervical spondylolysis    CHF (congestive heart failure) (HCC)    Chronic neck and back pain    Chronic orthostatic hypotension 03/07/2020   Likely 2/2 diabetic neuropathy;  Possible Recommendations: Abdominal compression, Maintain hydration/bolus fluids on bad days, Sleep with head of bed elevated at least 4 inches, work lower extremity muscles (such as bicycle kicks) prior to standing, continue PT/exercise, increase salt in diet or salt tabs, Consider medications (Midodrine 5-10mg  TID and/or Pyridostigmine 30mg  TID)     Closed compression fracture of L2 vertebra (HCC)    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   Complication of anesthesia    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    Controlled type 2 diabetes mellitus with diabetic neuropathy, with long-term current use of insulin (HCC) 09/30/2016   Diabetes (HCC)    Diabetes mellitus (HCC) 09/16/2010   Diabetes Mellitus     Diabetic polyneuropathy associated with type 2 diabetes mellitus (HCC) 11/02/2016   Difficulty in walking, not elsewhere classified 08/10/2019   Encephalopathy, unspecified 08/10/2019   Foot drop, right 02/26/2020   Foot drop, right    GERD (gastroesophageal reflux disease)     Heart failure with preserved ejection fraction (HCC)    History of hydrocephalus    History of lumbar laminectomy 05/04/2017   Hyperlipidemia    Hypertension    Infection of hand due to bite 04/12/2023   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   Lump in neck 04/12/2023   Major depressive disorder, single episode, unspecified 08/10/2019   Melanoma of scalp (HCC) 03/31/2022   Mild aortic stenosis    Mild aortic stenosis by prior echocardiogram 01/09/2021   Morbid obesity with BMI of 40.0-44.9, adult (HCC) 10/27/2017   Muscle weakness (generalized) 08/10/2019   Need for assistance with personal care 08/10/2019   Obesity    Other lack of coordination 08/10/2019   Other symptoms and signs involving cognitive functions and awareness 08/10/2019   Pancreatic cyst 10/08/2020   Personal history of DVT (deep vein thrombosis) 01/09/2021   Post laminectomy syndrome    Primary osteoarthritis of right shoulder    Renal mass, right 08/01/2020   Noted on CT in March 2022     Sleep apnea    Superior glenoid labrum lesion of right shoulder 08/10/2019   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   Hypogonadism     Unsteadiness on feet 08/10/2019    Past Surgical History:  Procedure Laterality Date   BACK SURGERY     in the 80's -herniated disc   BACK SURGERY     L4 bone spur 1990   CARPAL TUNNEL RELEASE Right 12/28/2016   Procedure: RIGHT ULNAR AND MEDIAN NEUROPLASTY AT WRIST;  Surgeon: Mack Hook, MD;  Location:  [facility];  Service: Orthopedics;  Laterality: Right;   CARPAL TUNNEL RELEASE Left    25 years ago   CATARACT EXTRACTION W/ INTRAOCULAR LENS IMPLANT Bilateral    EYE SURGERY     JOINT REPLACEMENT     REPLACEMENT TOTAL KNEE BILATERAL  2014   left 2012 rt 2014   Right hand foreign body     SHOULDER OPEN ROTATOR CUFF REPAIR Right 08/07/2019   Procedure: ROTATOR CUFF REPAIR SHOULDER OPEN;  Surgeon: Juanell Fairly, MD;  Location: [facility];  Service:  Orthopedics;  Laterality: Right;   SKIN GRAFT Left 06/19/2022   left side of neck for skin cancer on top of head   TONSILLECTOMY     VENTRICULO-PERITONEAL SHUNT PLACEMENT / LAPAROSCOPIC INSERTION PERITONEAL CATHETER      Prior to Admission medications   Medication Sig Start Date End Date Taking? Authorizing Provider  acetaminophen (TYLENOL) 500 MG tablet Take by mouth.    [provider]  albuterol (VENTOLIN HFA) 108 (90 Base) MCG/ACT inhaler Inhale 2 puffs into the lungs every 4 (four) hours as needed. 05/27/22 05/27/23  [provider]  Alpha-Lipoic Acid 600 MG TABS Take by mouth.    [provider]  aspirin EC 81 MG tablet Take 81 mg by mouth daily. 06/04/22   [provider]  buPROPion (WELLBUTRIN XL) 150 MG 24 hr tablet Take 450 mg by mouth every morning. 03/17/23   [provider]  carvedilol (COREG) 12.5 MG tablet Take 1 tablet (12.5 mg total) by mouth 2 (two) times daily with a meal. Patient taking differently: Take 6.25 mg by mouth 2 (two) times daily with a meal. 08/17/19   Lurene Shadow, MD  cholecalciferol (VITAMIN D3) 25 MCG (1000 UNIT) tablet Take 2,000 Units by mouth daily.    [provider]  cyanocobalamin (VITAMIN B12) 1000 MCG tablet Take 5,000 mcg by mouth daily.    [provider]  dapagliflozin propanediol (FARXIGA) 10 MG TABS tablet Take 1 tablet (10 mg total) by mouth daily. 04/06/23   Marrion Coy, MD  donepezil (ARICEPT) 10 MG tablet Take 10 mg by mouth at bedtime. 08/18/22   [provider]  ferrous sulfate 325 (65 FE) MG tablet Take 1 tablet (325 mg total) by mouth daily with breakfast. 04/06/23   Marrion Coy, MD  FLUoxetine (PROZAC) 20 MG capsule Take 20 mg by mouth daily.    [provider]  gabapentin (NEURONTIN) 300 MG capsule Take 3 capsules (900 mg total) by mouth 3 (three) times daily. Patient taking differently: Take 900 mg by mouth 2 (two) times daily. 10/21/18   Alford Highland, MD   insulin glargine (LANTUS) 100 UNIT/ML injection Inject 0.2 mLs (20 Units total) into the skin daily. Patient taking differently: Inject 30 Units into the skin daily. 04/05/23   Marrion Coy, MD  insulin lispro (HUMALOG) 100 UNIT/ML injection Inject 0.06 mLs (6 Units total) into the skin in the morning and at bedtime. 18 in am. 8 in pm 04/05/23   Marrion Coy, MD  ketoconazole (NIZORAL) 2 % cream Apply 1 Application topically daily.    [provider]  loratadine (CLARITIN) 10 MG tablet Take 10 mg by mouth daily.    [provider]  losartan (COZAAR) 25 MG tablet Take 25 mg by mouth daily.    [provider]  magnesium oxide (MAG-OX) 400 (240 Mg) MG tablet Take 500 mg by mouth daily.    [provider]  metFORMIN (GLUCOPHAGE) 500 MG  tablet Take 1,000 mg by mouth 2 (two) times daily with a meal.    [provider]  methocarbamol (ROBAXIN) 750 MG tablet Take 750 mg by mouth 4 (four) times daily.    [provider]  mometasone (ELOCON) 0.1 % cream Apply 1 Application topically daily.    [provider]  omeprazole (PRILOSEC) 20 MG capsule Take 40 mg by mouth 2 times daily at 12 noon and 4 pm.    [provider]  ondansetron (ZOFRAN-ODT) 4 MG disintegrating tablet Take 8 mg by mouth every 8 (eight) hours as needed for nausea. 07/05/22   [provider]  oxyCODONE (OXY IR/ROXICODONE) 5 MG immediate release tablet Take 5 mg by mouth every 4 (four) hours as needed. 11/23/22   [provider]  rosuvastatin (CRESTOR) 20 MG tablet Take 20 mg by mouth daily.    [provider]  senna-docusate (SENOKOT-S) 8.6-50 MG tablet Take 2 tablets by mouth daily. Patient not taking: Reported on 04/11/2023 06/01/21   [provider]  sertraline (ZOLOFT) 50 MG tablet Take 1 tablet (50 mg total) by mouth at bedtime. 05/18/23 08/16/23  Neysa Hotter, MD  spironolactone (ALDACTONE) 25 MG tablet Take 1 tablet (25 mg total) by  mouth daily. 04/06/23   Marrion Coy, MD  torsemide (DEMADEX) 20 MG tablet Take 1 tablet (20 mg total) by mouth daily. 04/05/23   Marrion Coy, MD  traZODone (DESYREL) 50 MG tablet Take 1 tablet by mouth at bedtime. 08/13/22 08/13/23  [provider]    Family History  Problem Relation Age of Onset   Ovarian cancer Mother    Kidney failure Father    Cancer Brother      Social History   Tobacco Use   Smoking status: Former    Current packs/day: 0.00    Average packs/day: 2.0 packs/day for 20.0 years (40.0 ttl pk-yrs)    Types: Cigarettes    Start date: 06/28/1976    Quit date: 06/28/1996    Years since quitting: 26.8    Passive exposure: Past   Smokeless tobacco: Never  Vaping Use   Vaping status: Never Used  Substance Use Topics   Alcohol use: Yes    Comment: occasionally   Drug use: No    Allergies as of 04/18/2023 - Review Complete 04/13/2023  Allergen Reaction Noted   Penicillins Anaphylaxis, Rash, and Other (See Comments) 12/23/2014   Succinylcholine chloride Anaphylaxis and Rash 01/02/2015   Semaglutide Diarrhea 06/04/2022   Keflex [cephalexin] Rash 12/23/2014   Sulfa antibiotics Rash and Other (See Comments) 12/23/2014    Review of Systems:    All systems reviewed and negative except where noted in HPI.   Physical Exam:  There were no vitals taken for this visit. No LMP for male patient.  General:   Alert,  Well-developed, well-nourished, pleasant and cooperative in NAD Lungs:  Respirations even and unlabored.  Clear throughout to auscultation.   No wheezes, crackles, or rhonchi. No acute distress. Heart:  Regular rate and rhythm; no murmurs, clicks, rubs, or gallops. Abdomen:  Normal bowel sounds.  No bruits.  Soft, and non-distended without masses, hepatosplenomegaly or hernias noted.  No Tenderness.  No guarding or rebound tenderness.    Neurologic:  Alert and oriented x3;  grossly normal neurologically. Psych:  Alert and cooperative. Normal mood and  affect.  Imaging Studies: US Venous Img Lower Bilateral (DVT)  Result Date: 04/01/2023 CLINICAL DATA:  Bilateral lower extremity swelling. EXAM: Bilateral LOWER EXTREMITY VENOUS DOPPLER ULTRASOUND  TECHNIQUE: Gray-scale sonography with compression, as well as color and duplex ultrasound, were performed to evaluate the deep venous system(s) from the level of the common femoral vein through the popliteal and proximal calf veins. COMPARISON:  None Available. FINDINGS: VENOUS Normal compressibility of the common femoral, superficial femoral, and popliteal veins, as well as the visualized calf veins. Visualized portions of profunda femoral vein and great saphenous vein unremarkable. No filling defects to suggest DVT on grayscale or color Doppler imaging. Doppler waveforms show normal direction of venous flow, normal respiratory plasticity and response to augmentation. Limited views of the contralateral common femoral vein are unremarkable. OTHER None. Limitations: none IMPRESSION: Negative. Electronically Signed   By: Elgie Collard M.D.   On: 04/01/2023 20:03   DG Chest 2 View  Result Date: 04/01/2023 CLINICAL DATA:  Shortness of breath. EXAM: CHEST - 2 VIEW COMPARISON:  Chest radiograph dated March 31, 2023 FINDINGS: The heart size and mediastinal contours are unchanged. Diffuse bilateral basilar predominant interstitial prominence. No focal consolidation. Similar small bilateral pleural effusions. No pneumothorax. Shunt tubing extending over the right chest. IMPRESSION: 1. Diffuse bilateral interstitial prominence appears similar to slightly decreased compared to the prior exam and may reflect pulmonary edema. 2. Similar small bilateral pleural effusions. Electronically Signed   By: Hart Robinsons M.D.   On: 04/01/2023 13:37   DG Chest 2 View  Result Date: 03/31/2023 CLINICAL DATA:  Cough, shortness of breath and congestion for 2 weeks. Symptoms began after getting a new CPAP machine. EXAM: CHEST  - 2 VIEW COMPARISON:  One-view chest x-ray 11/09/2022.  CT chest 11/09/2022. FINDINGS: The heart size is normal. Diffuse interstitial coarsening is increased slightly. Areas of scarring previously seen are again noted. No new focal airspace disease is present. The shunt tubing extends over the chest. Small bilateral effusions are present. IMPRESSION: 1. Slight increase in diffuse interstitial coarsening and small bilateral effusions. This may reflect pulmonary edema. 2. No new focal airspace disease. Electronically Signed   By: Marin Roberts M.D.   On: 03/31/2023 16:49    Assessment and Plan:   Sparrow Uhlenhake is a 67 y.o. y/o male has been referred for ***  Follow up ***  Celso Amy, PA-C    BP check ***

## 2023-04-18 ENCOUNTER — Telehealth: Payer: Self-pay

## 2023-04-18 ENCOUNTER — Ambulatory Visit: Payer: Medicare HMO | Admitting: Physician Assistant

## 2023-04-18 NOTE — Telephone Encounter (Signed)
The patient and his wife called in because his appointment was cancelled. I inform her his appointment was cancel because he was seen at [facility] this year, so he is now established with [facility]. She started cussing and I asked her to reframe from the cussing. The patient does have a colonoscopy schedule and office visit with [facility]. She said that her husband throwing up blood and going to the bathroom multiple times a day. Marcelino Duster stated that she patient spoke the patient today to inform him that she cancels his appointment and why. The patient wife said that she will find him another doctor outside of [facility] because this makes no sense and hang up.

## 2023-04-19 ENCOUNTER — Encounter: Payer: Self-pay | Admitting: Family

## 2023-04-19 ENCOUNTER — Ambulatory Visit: Payer: Medicare HMO | Attending: Family | Admitting: Family

## 2023-04-19 VITALS — BP 122/69 | HR 74 | Ht >= 80 in | Wt 309.0 lb

## 2023-04-19 DIAGNOSIS — J101 Influenza due to other identified influenza virus with other respiratory manifestations: Secondary | ICD-10-CM | POA: Diagnosis not present

## 2023-04-19 DIAGNOSIS — Z79899 Other long term (current) drug therapy: Secondary | ICD-10-CM | POA: Insufficient documentation

## 2023-04-19 DIAGNOSIS — I5032 Chronic diastolic (congestive) heart failure: Secondary | ICD-10-CM | POA: Insufficient documentation

## 2023-04-19 DIAGNOSIS — Z7984 Long term (current) use of oral hypoglycemic drugs: Secondary | ICD-10-CM | POA: Insufficient documentation

## 2023-04-19 DIAGNOSIS — F32A Depression, unspecified: Secondary | ICD-10-CM | POA: Diagnosis not present

## 2023-04-19 DIAGNOSIS — E1142 Type 2 diabetes mellitus with diabetic polyneuropathy: Secondary | ICD-10-CM | POA: Diagnosis not present

## 2023-04-19 DIAGNOSIS — E119 Type 2 diabetes mellitus without complications: Secondary | ICD-10-CM | POA: Diagnosis not present

## 2023-04-19 DIAGNOSIS — I10 Essential (primary) hypertension: Secondary | ICD-10-CM | POA: Diagnosis not present

## 2023-04-19 DIAGNOSIS — I110 Hypertensive heart disease with heart failure: Secondary | ICD-10-CM | POA: Diagnosis not present

## 2023-04-19 DIAGNOSIS — Z794 Long term (current) use of insulin: Secondary | ICD-10-CM | POA: Diagnosis not present

## 2023-04-19 DIAGNOSIS — Z85528 Personal history of other malignant neoplasm of kidney: Secondary | ICD-10-CM | POA: Insufficient documentation

## 2023-04-19 DIAGNOSIS — E785 Hyperlipidemia, unspecified: Secondary | ICD-10-CM | POA: Diagnosis not present

## 2023-04-19 DIAGNOSIS — E782 Mixed hyperlipidemia: Secondary | ICD-10-CM

## 2023-04-19 DIAGNOSIS — Z982 Presence of cerebrospinal fluid drainage device: Secondary | ICD-10-CM | POA: Insufficient documentation

## 2023-04-19 DIAGNOSIS — Z86718 Personal history of other venous thrombosis and embolism: Secondary | ICD-10-CM | POA: Insufficient documentation

## 2023-04-19 DIAGNOSIS — G4733 Obstructive sleep apnea (adult) (pediatric): Secondary | ICD-10-CM | POA: Diagnosis not present

## 2023-04-19 DIAGNOSIS — F419 Anxiety disorder, unspecified: Secondary | ICD-10-CM | POA: Insufficient documentation

## 2023-04-19 DIAGNOSIS — F329 Major depressive disorder, single episode, unspecified: Secondary | ICD-10-CM

## 2023-04-19 MED ORDER — CARVEDILOL 3.125 MG PO TABS: 3.1250 mg | ORAL_TABLET | Freq: Two times a day (BID) | ORAL | 0 refills | Status: DC

## 2023-04-19 NOTE — Patient Instructions (Signed)
DECREASE CARVEDILOL (COREG) TO 3.125 MG TWICE DAILY FOR 2 WEEKS  FOLLOW UP WITH DR. Shirlee Latch IN [place]

## 2023-04-19 NOTE — Progress Notes (Signed)
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   Admitted 05/23/22 due to increased shortness of breath for preceding 2 weeks. Found to by hypoxic. Oxygen saturation was 79% on room air. Patient found to have influenza A. Treated with bronchodilators as well as Tamiflu. Weaned off of oxygen. CT showed A 2.8 cm mass in the lateral midpole of the right kidney measures slightly above simple fluid attenuation and contains a single 2 mm calcification.  Admitted 11/09/22 due to a fall trying to get out of bed. Denied hitting his head. CT C-spine, CT chest, CT L-spine grossly stable apart from noted L2 vertebral fracture. Dr. Marcell Barlow with neurosurgery recommends PT/OT, use LSO brace. IV dilaudid given with subsequent drop in blood pressure. IVF given. PT/OT evaluation done.   Admitted 04/01/23 due to shortness of breath, hypoxia and pedal edema after having CPAP issues for ~ 2 weeks. Given IV lasix and diuresed ~ 13L. Echocardiogram performed 11/09/2022, ejection fraction was 55 to 60% with grade 1 diastolic dysfunction. Chest x-ray showed vascular congestion. Continue beta-blocker and ARB. Started spironolactone and [place].  Echo 10/22/18: EF 60-65% with trivial AR Echo 08/12/19: EF 60-65% with mild LVH, Grade I DD, mildly elevated PA pressure Echo 11/09/22: EF 55-60% with moderate LVH, trivial MR  He presents today with his wife for a HF follow-up visit with a chief complaint of moderate fatigue with minimal exertion. Has been constant since his admission last month. Has associated pedal edema (improving) &  intermittent dizziness along with this. Denies shortness of breath, chest pain, palpitations, cough, abdominal distention or difficulty sleeping.   Getting OT/ PT and home health nursing.    At last visit, carvedilol was decreased to 6.25mg  BID but he says that the fatigue/ tiredness isn't any better. Continues to wear his CPAP nightly. PCP told him that he thought farxiga was the cause of his fatigue.   Since last here he has seen the HF provider at [facility] and is wondering if he needs to continue seeing the HF clinic there as well as come to the [facility] HF clinic.   ROS: All systems negative except as listed in HPI, PMH and Problem List.  SH:  Social History   Socioeconomic History   Marital status: Married    Spouse name: Britta Mccreedy   Number of children: 0   Years of education: Not on file   Highest education level: Associate degree: academic program  Occupational History   Occupation: Retired  Tobacco Use   Smoking status: Former    Current packs/day: 0.00    Average packs/day: 2.0 packs/day for 20.0 years (40.0 ttl pk-yrs)    Types: Cigarettes    Start date: 06/28/1976    Quit date: 06/28/1996    Years since quitting: 26.8    Passive exposure: Past   Smokeless tobacco: Never  Vaping Use   Vaping status: Never Used  Substance and Sexual Activity   Alcohol use: Yes    Comment: occasionally   Drug use: No   Sexual activity: Not on file    Comment: not asked if sexually active  Other Topics Concern  Not on file  Social History Narrative   Right handed    Lives at home spouse    Caffeine 5 -6 cups     Social Determinants of Health   Financial Resource Strain: Low Risk  (04/04/2023)   Overall Financial Resource Strain (CARDIA)    Difficulty of Paying Living Expenses: Not hard at all  Food Insecurity: No Food Insecurity (11/09/2022)   Hunger Vital Sign    Worried About Running Out of Food in the Last Year: Never true    Ran Out of Food in the Last Year: Never true   Transportation Needs: No Transportation Needs (04/04/2023)   PRAPARE - Administrator, Civil Service (Medical): No    Lack of Transportation (Non-Medical): No  Physical Activity: Inactive (02/26/2020)   Received from [facility], [facility]   Exercise Vital Sign    Days of Exercise per Week: 0 days    Minutes of Exercise per Session: 0 min  Stress: No Stress Concern Present (02/26/2020)   Received from [facility], [facility]   [employer] of Occupational Health - Occupational Stress Questionnaire    Feeling of Stress : Not at all  Social Connections: Unknown (09/25/2021)   Received from [facility], [facility]   Social Network    Social Network: Not on file  Intimate Partner Violence: Not At Risk (11/09/2022)   Humiliation, Afraid, Rape, and Kick questionnaire    Fear of Current or Ex-Partner: No    Emotionally Abused: No    Physically Abused: No    Sexually Abused: No    FH:  Family History  Problem Relation Age of Onset   Ovarian cancer Mother    Kidney failure Father    Cancer Brother     Past Medical History:  Diagnosis Date   Abnormal MRI of abdomen 10/08/2020   At risk for falls 07/19/2018   Atopic dermatitis 07/08/2008   Atopic Dermatitis     Basal cell carcinoma of scalp 03/31/2022   Bilateral carpal tunnel syndrome 11/02/2016   Cervical spondylolysis    CHF (congestive heart failure) (HCC)    Chronic neck and back pain    Chronic orthostatic hypotension 03/07/2020   Likely 2/2 diabetic neuropathy;  Possible Recommendations: Abdominal compression, Maintain hydration/bolus fluids on bad days, Sleep with head of bed elevated at least 4 inches, work lower extremity muscles (such as bicycle kicks) prior to standing, continue PT/exercise, increase salt in diet or salt tabs, Consider medications (Midodrine 5-10mg  TID and/or Pyridostigmine 30mg  TID)     Closed compression  fracture of L2 vertebra (HCC)    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   Complication of anesthesia    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    Controlled type 2 diabetes mellitus with diabetic neuropathy, with long-term current use of insulin (HCC) 09/30/2016   Diabetes (HCC)    Diabetes mellitus (HCC) 09/16/2010   Diabetes Mellitus     Diabetic polyneuropathy associated with type 2 diabetes mellitus (HCC) 11/02/2016   Difficulty in walking, not elsewhere classified 08/10/2019   Encephalopathy, unspecified 08/10/2019   Foot drop, right 02/26/2020   Foot drop, right    GERD (gastroesophageal reflux disease)    Heart failure with preserved ejection fraction (HCC)    History of hydrocephalus    History of lumbar laminectomy 05/04/2017   Hyperlipidemia    Hypertension    Infection of hand due to bite 04/12/2023   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   Lump in neck 04/12/2023   Major depressive disorder, single episode, unspecified 08/10/2019   Melanoma of scalp (HCC) 03/31/2022   Mild aortic stenosis    Mild aortic stenosis by prior echocardiogram 01/09/2021   Morbid obesity with BMI of 40.0-44.9, adult (HCC) 10/27/2017   Muscle weakness (generalized) 08/10/2019   Need for assistance with personal care 08/10/2019   Obesity    Other lack of coordination 08/10/2019   Other symptoms and signs involving cognitive functions and awareness 08/10/2019   Pancreatic cyst 10/08/2020   Personal history of DVT (deep vein thrombosis) 01/09/2021   Post laminectomy syndrome    Primary osteoarthritis of right shoulder    Renal mass, right 08/01/2020   Noted on CT in March 2022     Sleep apnea    [place]ior glenoid labrum lesion of right shoulder 08/10/2019   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      albuterol (VENTOLIN HFA) 108 (90 Base) MCG/ACT inhaler Inhale 2 puffs into the lungs every 4 (four) hours as needed.     Alpha-Lipoic Acid 600 MG TABS Take by mouth.     aspirin EC 81 MG tablet Take 81 mg by mouth daily.     buPROPion (WELLBUTRIN XL) 150 MG 24 hr tablet Take 450 mg by mouth every morning.     carvedilol (COREG) 12.5 MG tablet Take 1 tablet (12.5 mg total) by mouth 2 (two) times daily with a meal. (Patient taking differently: Take 6.25 mg by mouth 2 (two) times daily with a meal.)     cholecalciferol (VITAMIN D3) 25 MCG (1000 UNIT) tablet Take 2,000 Units by mouth daily.     cyanocobalamin (VITAMIN B12) 1000 MCG tablet Take 5,000 mcg by mouth daily.     dapagliflozin propanediol (FARXIGA) 10 MG TABS tablet Take 1 tablet (10 mg total) by mouth daily. 30 tablet 0   donepezil (ARICEPT) 10 MG tablet Take 10 mg by mouth at bedtime.     ferrous sulfate 325 (65 FE) MG tablet Take 1 tablet (325 mg total) by mouth daily with breakfast. 30 tablet 0   FLUoxetine (PROZAC) 20 MG capsule Take 20 mg by mouth daily.     gabapentin (NEURONTIN) 300 MG capsule Take 3 capsules (900 mg total) by mouth 3 (three) times daily. (Patient taking differently: Take 900 mg by mouth 2 (two) times daily.)     insulin glargine (LANTUS) 100 UNIT/ML injection Inject 0.2 mLs (20 Units total) into the skin daily. (Patient taking differently: Inject 30 Units into the skin daily.)     insulin lispro (HUMALOG) 100 UNIT/ML injection Inject 0.06 mLs (6 Units total) into the skin in the morning and at bedtime. 18 in am. 8 in pm     ketoconazole (NIZORAL) 2 % cream Apply 1 Application topically daily.     loratadine (CLARITIN) 10 MG tablet Take 10 mg by mouth daily.     losartan (COZAAR) 25 MG tablet Take 25 mg by mouth daily.     magnesium oxide (MAG-OX) 400 (240 Mg) MG tablet Take 500 mg by mouth daily.     metFORMIN (GLUCOPHAGE) 500 MG tablet Take 1,000 mg by mouth 2 (two) times daily with a meal.     methocarbamol  (ROBAXIN) 750 MG tablet Take 750 mg by mouth 4 (four) times daily.  mometasone (ELOCON) 0.1 % cream Apply 1 Application topically daily.     omeprazole (PRILOSEC) 20 MG capsule Take 40 mg by mouth 2 times daily at 12 noon and 4 pm.     ondansetron (ZOFRAN-ODT) 4 MG disintegrating tablet Take 8 mg by mouth every 8 (eight) hours as needed for nausea.     oxyCODONE (OXY IR/ROXICODONE) 5 MG immediate release tablet Take 5 mg by mouth every 4 (four) hours as needed.     rosuvastatin (CRESTOR) 20 MG tablet Take 20 mg by mouth daily.     senna-docusate (SENOKOT-S) 8.6-50 MG tablet Take 2 tablets by mouth daily. (Patient not taking: Reported on 04/11/2023)     [START ON 05/18/2023] sertraline (ZOLOFT) 50 MG tablet Take 1 tablet (50 mg total) by mouth at bedtime. 90 tablet 0   spironolactone (ALDACTONE) 25 MG tablet Take 1 tablet (25 mg total) by mouth daily. 30 tablet 0   torsemide (DEMADEX) 20 MG tablet Take 1 tablet (20 mg total) by mouth daily. 30 tablet 0   traZODone (DESYREL) 50 MG tablet Take 1 tablet by mouth at bedtime.     No current facility-administered medications for this visit.   Vitals:   04/19/23 1445  BP: 122/69  Pulse: 74  SpO2: 99%  Weight: (!) 309 lb (140.2 kg)  Height: 6\' 9"  (2.057 m)   Wt Readings from Last 3 Encounters:  04/19/23 (!) 309 lb (140.2 kg)  04/11/23 (!) 307 lb (139.3 kg)  04/05/23 (!) 314 lb 2.5 oz (142.5 kg)   Lab Results  Component Value Date   CREATININE 1.10 04/05/2023   CREATININE 0.80 04/04/2023   CREATININE 0.86 04/03/2023    PHYSICAL EXAM:  General:  Well appearing. No resp difficulty HEENT: normal Neck: supple. JVP flat. No lymphadenopathy or thryomegaly appreciated. Cor: PMI normal. Regular rate & rhythm. No rubs, gallops or murmurs. Lungs: clear Abdomen: soft, nontender, nondistended. No hepatosplenomegaly. No bruits or masses. . Extremities: no cyanosis, clubbing, rash, 1+ pitting edema bilateral lower legs Neuro: alert & orientedx3,  cranial nerves grossly intact. Moves all 4 extremities w/o difficulty. Affect pleasant.   ECG: not done   ASSESSMENT & PLAN:  1: Chronic heart failure with preserved ejection fraction- - suspect due to HTN/ OSA - NYHA class III - euvolemic - weighing daily & home weight ranges from 307-309#; reviewed parameters to call (3+ pounds overnight/ 5+ pounds in a week) - Echo 10/22/18: EF 60-65% with trivial AR - Echo 08/12/19: EF 60-65% with mild LVH, Grade I DD, mildly elevated PA pressure - Echo 11/09/22: EF 55-60% with moderate LVH, trivial MR - has upcoming echo 02/25 @ [facility] - decrease carvedilol to 3.125mg  BID for the next 2 weeks and see if fatigue improves; may need to stop it altogether & if BP control needed, resume low dose amlodipine - continue farxiga 10mg  daily - continue losartan 25mg  daily - continue spironolactone 25mg  daily - continue torsemide 20mg  daily - saw cardiology (Alluri) 11/24 - BNP 04/01/23 was 252.6  2: HTN- - BP 122/69 - saw PCP Katrinka Blazing) 11/24 - BMP 04/13/23 showed sodium 141, potassium 4.1, creatinine 1.3 & GFR 60  3: DM- - saw endocrinology (Solum) 09/24 - A1c 04/01/23 was 6.2% - continue metformin 1000mg  BID  4: OSA- - wearing CPAP nightly - 2 weeks prior to Nov admission, was having consistent issues with his CPAP blowing too hard  5: Depression- - had video visit with psychiatry (Hisada) 11/24 - continues on prozac & zoloft  6: Hyperlipidemia- - continue rosuvastatin 20mg  daily - LDL 01/04/23 was 38  I explained to the patient that he could continue receiving care at the [facility] at the [facility], with [name] available on a PRN (as needed) basis. However, both the patient and his wife expressed a preference to continue receiving care at the [facility], with the goal of having all of their cardiology care within the [facility] system.  Return in 1 month, sooner if needed. If they change their mind and  decide to continue with [facility] HF clinic, they can call us back and cancel ours.

## 2023-04-28 ENCOUNTER — Ambulatory Visit: Payer: Medicare HMO | Admitting: Clinical

## 2023-05-18 DEATH — deceased

## 2023-05-19 ENCOUNTER — Ambulatory Visit: Admit: 2023-05-19 | Payer: Medicare HMO | Admitting: Gastroenterology

## 2023-05-19 HISTORY — DX: Spondylolysis, cervical region: M43.02

## 2023-05-19 HISTORY — DX: Wedge compression fracture of second lumbar vertebra, initial encounter for closed fracture: S32.020A

## 2023-05-19 HISTORY — DX: Other chronic pain: G89.29

## 2023-05-19 HISTORY — DX: Personal history of other diseases of the nervous system and sense organs: Z86.69

## 2023-05-19 HISTORY — DX: Unspecified diastolic (congestive) heart failure: I50.30

## 2023-05-19 HISTORY — DX: Heart failure, unspecified: I50.9

## 2023-05-19 HISTORY — DX: Nonrheumatic aortic (valve) stenosis: I35.0

## 2023-05-19 HISTORY — DX: Primary osteoarthritis, right shoulder: M19.011

## 2023-05-19 HISTORY — DX: Postlaminectomy syndrome, not elsewhere classified: M96.1

## 2023-05-19 SURGERY — COLONOSCOPY WITH PROPOFOL
Anesthesia: General

## 2023-05-23 ENCOUNTER — Encounter: Payer: Medicare HMO | Admitting: Cardiology

## 2023-06-08 ENCOUNTER — Telehealth: Payer: Medicare HMO | Admitting: Psychiatry
